# Patient Record
Sex: Male | Born: 1970 | Race: Black or African American | Hispanic: No | Smoking: Former smoker
Health system: Southern US, Community
[De-identification: ages and names within clinical notes are randomized; demographics above are authoritative.]

## PROBLEM LIST (undated history)

## (undated) DIAGNOSIS — Z940 Kidney transplant status: Secondary | ICD-10-CM

## (undated) DIAGNOSIS — M329 Systemic lupus erythematosus, unspecified: Secondary | ICD-10-CM

## (undated) DIAGNOSIS — N289 Disorder of kidney and ureter, unspecified: Secondary | ICD-10-CM

## (undated) DIAGNOSIS — IMO0002 Reserved for concepts with insufficient information to code with codable children: Secondary | ICD-10-CM

## (undated) DIAGNOSIS — D649 Anemia, unspecified: Secondary | ICD-10-CM

## (undated) DIAGNOSIS — M359 Systemic involvement of connective tissue, unspecified: Secondary | ICD-10-CM

## (undated) HISTORY — PX: NEPHRECTOMY TRANSPLANTED ORGAN: SUR880

## (undated) NOTE — *Deleted (*Deleted)
ID  Patient Vitals for the past 24 hrs:  BP Temp Temp src Pulse Resp SpO2  11/30/19 0823 123/60 98.4 F (36.9 C) Oral 65 16 100 %  11/30/19 0527 125/63 98.3 F (36.8 C) Oral 72 20 100 %  11/29/19 1943 130/75 97.7 F (36.5 C) - 76 20 100 %  11/29/19 1523 139/60 98.5 F (36.9 C) Oral - - -  11/29/19 1203 125/70 98 F (36.7 C) Oral 71 17 100 %    CBC Latest Ref Rng & Units 11/28/2019 11/27/2019 11/26/2019  WBC 4.0 - 10.5 K/uL 18.5(H) 9.5 11.7(H)  Hemoglobin 13.0 - 17.0 g/dL 10.1(L) 9.7(L) 9.5(L)  Hematocrit 39 - 52 % 31.3(L) 31.8(L) 29.8(L)  Platelets 150 - 400 K/uL 196 148(L) 158    CMP Latest Ref Rng & Units 11/29/2019 11/28/2019 11/27/2019  Glucose 70 - 99 mg/dL 99 99 94  BUN 6 - 20 mg/dL 26(H) 55(H) 41(H)  Creatinine 0.61 - 1.24 mg/dL 3.38(H) 5.51(H) 4.43(H)  Sodium 135 - 145 mmol/L 138 136 137  Potassium 3.5 - 5.1 mmol/L 3.5 5.0 5.2(H)  Chloride 98 - 111 mmol/L 99 98 101  CO2 22 - 32 mmol/L 27 24 22   Calcium 8.9 - 10.3 mg/dL 8.9 9.6 9.4  Total Protein 6.5 - 8.1 g/dL - - -  Total Bilirubin 0.3 - 1.2 mg/dL - - -  Alkaline Phos 38 - 126 U/L - - -  AST 15 - 41 U/L - - -  ALT 0 - 44 U/L - - -    Impression/recommendation 35 yr male with ESRD, 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 He was in septic shock and was in the ICU. Had CRRT, the wound culture had gram neg rods but was not finalized due to multiple organisms present. He was treated with vanco, ceftriaxone, followed by vanco, cefepime and clindamycin and then cefazolin for 4 days. The total duration of antibiotic was 10 days. Leucocytosis resolved and then shot up again and I am asked to see the patient for the same The wound does not look infected now As he is stable and no obvious signs of infection currently will hold off starting antibiotics and will repeat CBC . He alsoreceived high dose steroids which is being  tapered now. Discussed the management with the patient and Dr.Griffith

---

## 1998-04-06 ENCOUNTER — Encounter: Payer: Self-pay | Admitting: Emergency Medicine

## 1998-04-06 ENCOUNTER — Emergency Department (HOSPITAL_COMMUNITY): Admission: EM | Admit: 1998-04-06 | Discharge: 1998-04-06 | Payer: Self-pay | Admitting: Emergency Medicine

## 1998-05-04 ENCOUNTER — Ambulatory Visit (HOSPITAL_COMMUNITY): Admission: RE | Admit: 1998-05-04 | Discharge: 1998-05-05 | Payer: Self-pay | Admitting: *Deleted

## 1998-07-30 ENCOUNTER — Emergency Department (HOSPITAL_COMMUNITY): Admission: EM | Admit: 1998-07-30 | Discharge: 1998-07-30 | Payer: Self-pay | Admitting: *Deleted

## 1999-03-07 ENCOUNTER — Emergency Department (HOSPITAL_COMMUNITY): Admission: EM | Admit: 1999-03-07 | Discharge: 1999-03-07 | Payer: Self-pay | Admitting: Emergency Medicine

## 1999-06-14 ENCOUNTER — Inpatient Hospital Stay (HOSPITAL_COMMUNITY): Admission: EM | Admit: 1999-06-14 | Discharge: 1999-06-17 | Payer: Self-pay | Admitting: Emergency Medicine

## 1999-06-16 ENCOUNTER — Encounter: Payer: Self-pay | Admitting: Infectious Diseases

## 1999-10-18 ENCOUNTER — Inpatient Hospital Stay (HOSPITAL_COMMUNITY): Admission: EM | Admit: 1999-10-18 | Discharge: 1999-10-20 | Payer: Self-pay | Admitting: Emergency Medicine

## 1999-10-29 ENCOUNTER — Inpatient Hospital Stay (HOSPITAL_COMMUNITY): Admission: AD | Admit: 1999-10-29 | Discharge: 1999-11-01 | Payer: Self-pay

## 2000-12-14 ENCOUNTER — Emergency Department (HOSPITAL_COMMUNITY): Admission: EM | Admit: 2000-12-14 | Discharge: 2000-12-14 | Payer: Self-pay | Admitting: *Deleted

## 2000-12-28 ENCOUNTER — Emergency Department (HOSPITAL_COMMUNITY): Admission: EM | Admit: 2000-12-28 | Discharge: 2000-12-29 | Payer: Self-pay | Admitting: Emergency Medicine

## 2001-02-12 ENCOUNTER — Encounter (INDEPENDENT_AMBULATORY_CARE_PROVIDER_SITE_OTHER): Payer: Self-pay | Admitting: Specialist

## 2001-02-12 ENCOUNTER — Inpatient Hospital Stay (HOSPITAL_COMMUNITY): Admission: EM | Admit: 2001-02-12 | Discharge: 2001-02-19 | Payer: Self-pay | Admitting: Emergency Medicine

## 2001-02-12 ENCOUNTER — Encounter: Payer: Self-pay | Admitting: Emergency Medicine

## 2001-02-12 ENCOUNTER — Encounter: Payer: Self-pay | Admitting: *Deleted

## 2001-02-14 ENCOUNTER — Encounter: Payer: Self-pay | Admitting: Internal Medicine

## 2001-02-15 ENCOUNTER — Encounter: Payer: Self-pay | Admitting: *Deleted

## 2001-02-16 ENCOUNTER — Encounter: Payer: Self-pay | Admitting: Nephrology

## 2001-02-19 ENCOUNTER — Encounter: Payer: Self-pay | Admitting: Nephrology

## 2001-04-25 ENCOUNTER — Inpatient Hospital Stay (HOSPITAL_COMMUNITY): Admission: EM | Admit: 2001-04-25 | Discharge: 2001-04-28 | Payer: Self-pay | Admitting: Emergency Medicine

## 2001-05-06 ENCOUNTER — Ambulatory Visit (HOSPITAL_COMMUNITY): Admission: RE | Admit: 2001-05-06 | Discharge: 2001-05-06 | Payer: Self-pay

## 2001-05-16 ENCOUNTER — Encounter: Payer: Self-pay | Admitting: Nephrology

## 2001-05-16 ENCOUNTER — Encounter (INDEPENDENT_AMBULATORY_CARE_PROVIDER_SITE_OTHER): Payer: Self-pay | Admitting: Specialist

## 2001-05-16 ENCOUNTER — Inpatient Hospital Stay (HOSPITAL_COMMUNITY): Admission: EM | Admit: 2001-05-16 | Discharge: 2001-05-18 | Payer: Self-pay | Admitting: Emergency Medicine

## 2001-05-28 ENCOUNTER — Encounter: Payer: Self-pay | Admitting: Nephrology

## 2001-05-28 ENCOUNTER — Inpatient Hospital Stay (HOSPITAL_COMMUNITY): Admission: EM | Admit: 2001-05-28 | Discharge: 2001-06-01 | Payer: Self-pay

## 2001-06-01 ENCOUNTER — Encounter: Payer: Self-pay | Admitting: *Deleted

## 2001-08-18 ENCOUNTER — Ambulatory Visit (HOSPITAL_COMMUNITY): Admission: RE | Admit: 2001-08-18 | Discharge: 2001-08-18 | Payer: Self-pay | Admitting: Vascular Surgery

## 2001-08-20 ENCOUNTER — Ambulatory Visit (HOSPITAL_COMMUNITY): Admission: RE | Admit: 2001-08-20 | Discharge: 2001-08-20 | Payer: Self-pay | Admitting: Vascular Surgery

## 2001-08-20 ENCOUNTER — Encounter: Payer: Self-pay | Admitting: Vascular Surgery

## 2001-09-17 ENCOUNTER — Inpatient Hospital Stay (HOSPITAL_COMMUNITY): Admission: AD | Admit: 2001-09-17 | Discharge: 2001-09-19 | Payer: Self-pay | Admitting: Nephrology

## 2001-09-20 ENCOUNTER — Encounter: Payer: Self-pay | Admitting: Vascular Surgery

## 2001-09-20 ENCOUNTER — Observation Stay (HOSPITAL_COMMUNITY): Admission: RE | Admit: 2001-09-20 | Discharge: 2001-09-20 | Payer: Self-pay | Admitting: Vascular Surgery

## 2003-10-18 ENCOUNTER — Encounter: Admission: RE | Admit: 2003-10-18 | Discharge: 2003-10-18 | Payer: Self-pay | Admitting: Nephrology

## 2003-10-23 ENCOUNTER — Ambulatory Visit (HOSPITAL_COMMUNITY): Admission: RE | Admit: 2003-10-23 | Discharge: 2003-10-23 | Payer: Self-pay | Admitting: Nephrology

## 2004-10-03 ENCOUNTER — Encounter: Admission: RE | Admit: 2004-10-03 | Discharge: 2004-10-03 | Payer: Self-pay

## 2005-07-11 ENCOUNTER — Emergency Department (HOSPITAL_COMMUNITY): Admission: EM | Admit: 2005-07-11 | Discharge: 2005-07-11 | Payer: Self-pay | Admitting: Emergency Medicine

## 2007-09-05 ENCOUNTER — Ambulatory Visit: Payer: Self-pay | Admitting: Internal Medicine

## 2007-09-05 ENCOUNTER — Inpatient Hospital Stay (HOSPITAL_COMMUNITY): Admission: EM | Admit: 2007-09-05 | Discharge: 2007-09-07 | Payer: Self-pay | Admitting: Emergency Medicine

## 2010-02-10 ENCOUNTER — Encounter: Payer: Self-pay | Admitting: Nephrology

## 2010-06-04 NOTE — Consult Note (Signed)
NAMEJEREMEE, [name] NO.:  [id number]   MEDICAL RECORD NO.:  [id number]          PATIENT TYPE:  OBV   LOCATION:  [id number]                         FACILITY:  [facility]   PHYSICIAN:  Maudie Flakes. Hassell Done, M.D.   DATE OF BIRTH:  Dec 09, 1970   DATE OF CONSULTATION:  DATE OF DISCHARGE:                                 CONSULTATION   REASON FOR CONSULT:  Continuity of hemodialysis.   HISTORY OF PRESENT ILLNESS:  The patient is a 40 year old black man with  end-stage renal disease on hemodialysis, SLE, and BPH.  The patient  presented with 2-day history of pain in lower abdomen, perineal region,  and also in his genitalia.  This was associated with dysuria, urinary  urgency, fever, chills, and back pain.  The patient measured his fever  to be as high as 102 degrees Fahrenheit.  Normally, the patient passes  only small amount of urine, but with his pain he has been passing small  amount of dark colored cloudy material.  However, there is no blood in  his urates on discharge.  The patient took Tylenol for his fever, which  helped only little and his pain is constant.  The patient denies having  had any recent unprotected sexual intercourse.  He had last been checked  for HIV 2 years ago and it had been negative.   The patient's end-stage renal disease is secondary to SLE and he has  been on hemodialysis since 2001.  He goes to [facility] and gets Monday, Wednesday, and Friday dialysis.  He has got an  AV fistula on his right arm.   ALLERGIES:  The patient is allergic to CIPROFLOXACIN and SULFONAMIDE.   PAST MEDICAL HISTORY:  1. 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  2. SLE diagnosed in 1994.  3. BPH.  4. Hypertension, off medications because of problem with low blood      pressure.  5. Anemia.  6. Diverticulosis.  7. Cellulitis to left leg in 2001.  8. History of PermCath infection.   MEDICATIONS:  1. Aspirin 81 mg.  2. Hydroxychloroquine 200 mg once  daily.  3. Multivitamin 1 a day.  4. Protonix 40 mg once a day.  5. Prednisone 10 mg once daily.  6. Zosyn day one.   SOCIAL HISTORY:  The patient is a former smoker.  He drinks  occasionally. he is divorced.   FAMILY HISTORY:  The patient's mother died in her 30s secondary to brain  aneurysm.  The patient's father died--had history of coronary artery  disease.  Daughter with SLE.   REVIEW OF SYSTEMS:  Positive for fever, chills, polyuria, frequency,  urgency, and dysuria.  Negative for headache, focal neurological  deficits, rash, chest pain, shortness of breath, nausea, vomiting, and  diarrhea.   PHYSICAL EXAMINATION:  VITAL SIGNS:  Temperature 97.8, pulse 107 and  regular, respiratory rate 20, blood pressure 108/61, and oxygen  saturation 97% on room air.  GENERAL:  Moderate distress secondary to pain.  HEENT:  Normocephalic and atraumatic.  Moist mucous membranes.  Oropharynx without erythema or exudates.  NECK:  Supple without lymphadenopathy.  CARDIOVASCULAR:  Regular rate and rhythm with normal heart sound,  regular tachycardia, no murmur, rubs, or gallops.  LUNGS:  Clear to auscultation bilaterally.  SKIN:  No rash or lesions.  ABDOMEN:  Suprapubic tenderness.  GENITOURINARY:  Positive discharge noted on tip of penis.  RECTAL:  Tender prostate, done in ED.  EXTREMITIES:  No cyanosis, clubbing, or edema.  AXIS:  Right arm AV fistula.  NEURO:  Alert and oriented x3.  Nonfocal.   LABORATORY DATA:  WBC 15.6, ANC 13.9, MCV 105.2, hemoglobin 10.6, and  platelets 158.  Sodium 132, potassium 5.1, chloride 96, bicarbonate 22,  BUN 56, creatinine 12.7, last glucose 69, calcium 9.1.  Urinalysis, wbc  too numerous to count, rbc 3-6, and bacteria plus.   ASSESSMENT AND PLAN:  This is a 40 year old black man with SLE, end-  stage renal disease, presenting with perineal and genital pain, as well  as urethral discharge consistent with acute prostatitis.  1. End-stage renal  disease.  We will continue with Monday, Wednesday,      and Friday hemodialysis.  Currently, the patient's potassium in      normal.  He is nonacidotic and he does not have any uremic      symptoms.  We will get records from [facility]      only tomorrow to resume the patient's dialysis after checking the      patient's dry weight and erythropoietin dose.  2. Acute prostatitis.  We agree with antibiotics treatment.  It is      advisable that the patient will be placed on an antibiotic regimen,      which  is suitable with his hemodialysis schedule.  The patient can      be ideally switched to ceftazidime and vancomycin before he is sent      home.  We will need to follow culture results especially urine      culture results and we recommend getting blood culture too.  GC and      chlamydia studies will have to be followed and we also recommend      getting an HIV antibody test.  3. Systemic lupus erythematosus.  Continue with hydroxychloroquine.  4. Microcystic anemia.  Monitor hemoglobin and hematocrit.  Check      anemia panel, continue erythropoietin and      iron supplement based on records from his dialysis center.  5. Hypertension.  Hold off on BP medications for now as the patient's      blood pressure looks little soft.   Thank you for allowing [place] to help take care of this patient.      Dawna Part, MD  Electronically Signed      Maudie Flakes. Hassell Done, M.D.  Electronically Signed    AS/MEDQ  D:  09/05/2007  T:  09/06/2007  Job:  [id number]

## 2010-06-07 NOTE — H&P (Signed)
[facility]  Patient:    THEOPOLIS, AMESQUITA Visit Number: [id number] MRN: [id number]          Service Type: MED Location: [id number] Attending Physician:  Sol Blazing Dictated by:   Maia Plan, P.A. Admit Date:  05/28/2001   CC:         Lowella Bandy. Olevia Perches, M.D. [facility]  [facility]  W. Thomos Lemons, M.D.   History and Physical  REASON FOR ADMISSION: Abdominal pain.  HISTORY OF PRESENT ILLNESS: This is a 40 year old black male, with end-stage renal disease secondary to systemic lupus erythematosus, on chronic hemodialysis every Monday, Wednesday, and Friday at [facility], who has been receiving Tressie Ellis for the last month for Stenotrophomonas maltophilia sepsis since around April 21, 2001.  Also recently hospitalized May 16, 2001 to May 18, 2001 with abdominal pain.  He underwent colonoscopy by Dr. Delfin Edis with findings of ascending colonic ulcer and colitis on pathology.  CT of the abdomen at that time showed improved bowel wall thickening in the transverse colon but worsened in the right colon and cecum.  A capsule endoscopy was recommended but the patient left AMA.  Over the past week he has had post dialysis fevers and chills, with temperatures ranging 99-102.4 degrees.  (The patient had fevers during dialysis while hospitalized also).  Blood cultures were repeated on May 24, 2001 and are now growing Enterobacter which are resistant to penicillin and Tressie Ellis among other drugs.  It is sensitive to tobramycin and it was ordered to be dosed today; however, the patient was awakened at approximately 3 a.m. with diffuse abdominal pain and came to the emergency room, thereby missing his outpatient dialysis treatment this morning.  He describes his abdominal pain as "burning and cramping."  He had nausea, vomiting, and diarrhea - three to four episodes on Wednesday, May 26, 2001, but none since.  He is  being admitted now for treatment of his UN or bacteria sepsis, management of his GI pain, and catheter removal.  ALLERGIES:  1. CIPRO.  2. SULFA.  CURRENT MEDICATIONS:  1. Hytrin 5 mg b.i.d.  2. Metoprolol 50 mg b.i.d.  3. Procardia XL 90 mg q.p.m.  4. Calcium carbonate 500 mg two with each meal.  5. Nephro-Vite vitamin one q.d.  6. Protonix 40 mg q.h.s.  7. Prednisone 7.5 mg q.d.  8. Plaquenil 400 mg q.d.  9. Fortaz 2 g IV each dialysis. 10. InFeD 50 mg IV every Wednesday. 11. Epogen 15,000 units IV each dialysis. 12. Rocaltrol 0.5 mcg q.d.  PAST MEDICAL HISTORY:  1. End-stage renal disease secondary to systemic lupus erythematosus,     starting on hemodialysis September 2002.  2. History of GI bleeds including mild duodenitis, diverticulosis, and     most recently colitis.  3. Hypertension.  4. Systemic lupus erythematosus, followed by Dr. Justine Null, diagnosed 1994.  5. Iron deficiency anemia.  6. 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  7. History of multiple sepsis, hemodialysis catheter related, including     Serratia marcescens, Stenotrophomonas maltophilia, and most recently     Enterobacter.  8. Benign prostatic hypertrophy.  SOCIAL HISTORY: The patient lives in [place].  Separated currently and has a seven-year-old daughter.  He occasionally smokes cigarettes but denies alcohol.  FAMILY HISTORY: Mother is deceased from aneurysm.  His father had Crohns disease and hypertension.  He has two half-brothers, one with heart disease.  REVIEW OF SYSTEMS:  Positive for fever and chills, generalized malaise, decreased appetite, abdominal pain, nausea, vomiting, and diarrhea on occasion.  Negative for shortness of breath, chest pain, cough, URI symptoms, dysuria, edema, obvious blood in vomit or stools.  PHYSICAL EXAMINATION:  VITAL SIGNS: On admission blood pressure is 180/105, temperature 98.7 degrees, pulse is 82 and regular, respirations are  18.  GENERAL: Well-developed, well-nourished black male in no acute distress, who is now status post Dilaudid therapy.  He is awake, alert, appropriate, oriented x3.  NECK: Supple.  LUNGS: Clear.  The right IJ Diatek exit site has a small amount of green crust but no pus expressable.  The tunnel is nontender and otherwise unremarkable.  HEART: Regular rate and rhythm.  Positive S4.  No murmurs, no rub.  ABDOMEN: Positive bowel sounds.  Belly is soft with diffuse and generalized tenderness throughout.  There is question of rebound.  There are no masses, no organomegaly.  RECTAL: Patient refused.  EXTREMITIES: Nonfunctioning right radial AV fistula.  No lower extremity edema.  LABORATORY DATA: Laboratories are pending.  ASSESSMENT/PLAN:  1. New Enterobacter sepsis, May 24, 2001.  Possibly a gastrointestinal source.     Will add tobramycin and continue [place].  Plan to proceed with already     planned catheter removal after dialysis today in view of infra and post     dialysis fever and chills.  New catheter is scheduled for Jun 01, 2001     with cardiovascular/thoracic surgery.  2. Abdominal pain and colitis.  Follow symptoms with antibiotic therapy.  Use     clear liquids for now.  Continue medications.  Consult gastroenterology if     symptoms do not resolve.  3. End-stage renal disease.  Dialysis today, then catheter removal.  4. Hypertension.  Continue medications.  5. Systemic lupus erythematosus.  Continue Plaquenil and prednisone.  6. Iron deficiency anemia secondary to #2 and #3.  Continue Epogen and     InFeD.  Transfuse p.r.n.  Repeat stool guaiac (strongly positive May 16, 2001). Dictated by:   Maia Plan, P.A. Attending Physician:  Sol Blazing DD:  05/28/01 TD:  05/30/01 Job: [id number]

## 2010-06-07 NOTE — Discharge Summary (Signed)
[name]. [name]  Patient:    Walter, [name]                        MRN: [id number] Adm. Date:  [id number] Disc. Date: [id number] Attending:  Arlice [name]                           Discharge Summary  DISCHARGE DIAGNOSES: 1. Cellulitis, left thigh, organism currently undetermined. 2. 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 3. Chronic renal failure, with a creatinine of 4.6 to 5.6, secondary to    sclerosing glomerular nephritis. 4. Hypertension. 5. Diffuse eczematoid pustulosis, secondary to antibiotic drug reaction. 7. IgM antiphospholipid antibody positive, negative IgG antiphospholipid    antibody and lupus anticoagulant in the past.  HISTORY OF PRESENT ILLNESS:  [name] is a pleasant gentleman age 40, who unfortunately has had sclerosing glomerular nephritis, which has progressed to chronic renal insufficiency, with repeat biopsies showing sclerosis, but not active renal disease, and hence no need for either steroids or cyclophosphamide.  He developed, in spite of being relatively stable overall, about October 17, 1999, a small nodule and tenderness in the medial left thigh, with no trauma, which began to expand some, with associated fever, and was seen and admitted at that time for possible cellulitis.  Blood cultures were negative.  There was no other fluid to culture, and he improved on IV Zosyn with decrease in temperature and decreased swelling.  Subsequently on home Augmentin he developed an eczematoid pustulosis, has probably a drug reaction to one or other antibiotics, which eventually responded to increasing his prednisone and discontinuing his medications.  Because of spreading of the area of inflammation, he was then readmitted for aspiration and culture of this area, an  infectious disease consultation, and a renal consultation, because of the difficulty with antibiotic therapy in him, and also a previous history of beta-lactam allergies, as well as to floxin drugs, and sulfa drugs.  [name] COURSE:  He had a reasonably benign hospitalization with infectious disease input and renal input being noted in the chart.  A culture was obtained of the area of inflammation.  There was some pustular material removed, but at the time of discharge, no acetic bacteria or other organisms have been grown, with cultures pending on fungi and AFB.  He did seem to respond to the initiation of clindamycin which he did tolerate, and was felt safe to discharge him home relatively early, on clindamycin, for followup by Infectious Disease and [name] carefully.  Renal will continue to follow him, obviously because of his need for dialysis in the near future.  LABORATORY DATA:  Revealed a white count of 14,200 on admission, and hemoglobin 7.0, rising to 8.3, and subsequently being followed outside the [name], without a transfusion initially, with no bleeding because of the presence of warm antibody, making transfusion difficulty.  Platelets were 451,000.  He had 84 neutrophils, 11 lymphs, 3 monos, and 1 eosinophil on admission.  His sedimentation was greater than 140, and his PT was 12.7, INR 1.0, PTT 31.  Sodium 141, potassium 4.6, chloride 108, BUN 78, creatinine 5.1, calcium 8.6, albumin 3.1, amylase slightly elevated on admission to 178. Normal liver function tests otherwise.  CPK was 78.  Iron levels were basically normal.  HIV was nonreactive.  C3 was low at 75, low normal 88, C4 level at 14, low normal 16.  His urine showed 100 mg per dl of protein. Blood cultures were no growth at this time as well.  An MRI did suggest an area of soft tissue abscess in the medial thigh of small size.  DISPOSITION:  It was felt that he had improved sufficiently to follow  him as an outpatient, with continue antibiotic of clindamycin, and close followup. These appointments were scheduled for him.  He would be started on a PPO if need be if his hemoglobin did not rise, once his more acute infection and inflammatory process resolved.  DISCHARGE MEDICATIONS: 1. Clindamycin 300 mg q.8h. 2. Prednisone 20 mg q.d., to be tapered back to 10 mg. 3. Procardia XL 60 mg q.d. 4. Multivitamin one q.d.  FOLLOWUP:  He is to see [name] in one week, or sooner if needed.  He is to follow up with infectious disease in about two weeks. DD:  12/03/99 TD:  12/03/99 Job: [id number]

## 2010-06-07 NOTE — Consult Note (Signed)
[facility]  Patient:    [name], [name] Visit Number: [id number] MRN: [id number]          Service Type: MED Location: [id number] Attending Physician:  Sol Blazing Dictated by:   Judeth Cornfield. Scot Dock, M.D. Proc. Date: 04/27/01 Admit Date:  04/24/2001                            Consultation Report  REASON FOR CONSULTATION:  Hemodialysis access.  HISTORY:  This is a pleasant 40 year old gentleman who was admitted on April 25, 2001 with a GI bleed.  He has apparently undergone a fairly thorough work-up without any identification of the source of bleeding.  Patient also has a history of end-stage renal disease secondary to lupus and currently has a functioning right IJ temporary dialysis catheter.  He was apparently scheduled to see [place] in the office to arrange for future access, but was in the hospital.  We are asked to see him during this admission to consider our options for further access.  Of note, this patient had an AV fistula placed in his right wrist in [place] in October 2002.  Apparently, this wrist was operated on twice, once to try to revise the fistula.  This fistula is now clotted and nonfunctioning.  The patient is left handed.  His end-stage renal disease is secondary to lupus.  He dialyzes typically on Mondays, Wednesdays, and Fridays.  He is scheduled for dialysis tomorrow.  With respect to his recurrent GI bleed, it is felt that most likely the source is the small bowel, although this has never been clearly identified despite a very aggressive work-up.  He is apparently being considered for capsule endoscopy once this program is in full force here at [facility].  PAST MEDICAL HISTORY: 1. Lupus. 2. End-stage renal disease secondary to lupus. 3. Hypertension. 4. History of anemia secondary to GI bleed as described above and also    secondary to his end-stage renal disease. 5. BPH.  SOCIAL HISTORY:  He  quit smoking recently.  He had been smoking a half a pack per day.  PHYSICAL EXAMINATION  VITAL SIGNS:  Temperature 97.4, blood pressure 147/89, heart rate 78.  EXTREMITIES:  He does not have a palpable right radial pulse.  I cannot even obtain a radial signal on the right with a Doppler.  He does have a brisk ulnar signal on the right and a palmar arch signal on the right.  I did not see a usable upper arm cephalic vein.  On the left side he has a palpable brachial and radial pulse.  I have recommended that we map his upper arm cephalic vein on the right to determine if he is a candidate for an upper arm fistula on the right.  He will be at slightly increased risk for steel given his radial artery occlusion on the right, although he appears to have a widely patent ulnar system.  If he is not a candidate for fistula, would consider placing an AV graft on the right side either in the forearm or in the upper arm if this artery and vein were too small.  I have discussed the procedure and potential complications including, but not limited to, bleeding, wound problems, steel syndrome, graft thrombosis, and graft infection.  All his questions were answered.  We can tentatively schedule this for Thursday if he is still in the hospital or else arrange for  it as an outpatient if he goes home before then.  He is concerned about his insurance and wants to check on this before agreeing to schedule surgery on Thursday. Dictated by:   Judeth Cornfield Scot Dock, M.D. Attending Physician:  Sol Blazing DD:  04/27/01 TD:  04/27/01 Job: [id number]

## 2010-06-07 NOTE — Discharge Summary (Signed)
[name], [name]               ACCOUNT NO.:  [id number]   MEDICAL RECORD NO.:  [id number]          PATIENT TYPE:  INP   LOCATION:  [id number]                         FACILITY:  [facility]   PHYSICIAN:  C. Milta Deiters, M.D.DATE OF BIRTH:  01-Jan-1971   DATE OF ADMISSION:  09/05/2007  DATE OF DISCHARGE:  09/07/2007                               DISCHARGE SUMMARY   DISCHARGE DIAGNOSES:  1. Acute prostatitis, cystitis.  2. End-stage renal disease, hemodialysis dependent.  3. Anemia of chronic disease.  4. Systemic lupus erythematosus, steroid dependent.  5. Diverticulosis.  6. History of cellulitis to the left leg in 2001.  7. History of Permacath infection.  8. Benign prostatic hyperplasia.   His medications at discharge:  1. Flomax 0.4 mg p.o. nightly.  2. Prednisone 5 mg p.o. daily.  3. Plaquenil 400 mg p.o. daily.  4. Omeprazole 20 mg p.o. nightly.  5. Fosrenol 1500 mg p.o. t.i.d.  6. Nephro-Vite p.o. daily.  7. Phenergan 25 mg p.o. p.r.n. for nausea.  8. Vicodin 5/500 mg 1 tablet p.o. q.6 h. p.r.n. for pain.  9. Aspirin 81 mg p.o. daily.  10.Aranesp 1000 mcg IV t.i.d. three times weekly with dialysis.   DISPOSITION AND FOLLOWUP:  [name] is being discharged in stable and  improved condition.  He has resolution of his symptoms of prostatitis.  He will return to his normal hemodialysis schedule on Monday, Wednesday,  Friday at [facility].  Dialysis orders will be per  Nephrology.  His primary nephrologist is Dr. Corliss Parish and he  will have hospital followup with her in 2-4 weeks.   BRIEF ADMITTING HISTORY AND PHYSICAL:  Vital signs at admission,  temperature 97.2, blood pressure 87/59, pulse 150, respiratory rate 24,  O2 sats 97% on room air.   Laboratories on admission, sodium 132, potassium 5.1, chloride 96,  bicarb 22, BUN 56, creatinine 12.7, glucose 69, hemoglobin 10.6, WBCs  15.6, hematocrit 32, platelets 158, ANC 13.9, MCV 105.2, anion gap  14.  Urinalysis showed WBCs too numerous to count, 3-6 rbc's with many  bacteria, greater than 300 protein, pH was 7, moderate leukocyte  esterase.  His EKG showed sinus tachycardia of no ST elevation or  depression.  No T-wave changes or other changes consistent with  significant hyperkalemia.   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  He has a known history of BPH.  However, he presented to the emergency  room complaining of approximately 36 hours of genital pain and urinary  frequency.  He had a fever of 102 prior to coming to the emergency room  and had severe pain in his scrotal region when urinating.  He had not  been taking his terazosin for the past 2-3 months due to episodes of  hypotension.   CONSULTATIONS:  Maudie Flakes. Hassell Done, MD with [facility] for  hemodialysis and evaluation.   PROCEDURES:  1. Hemodialysis, Monday, Wednesday, and Friday on his regular schedule      while in the hospital.  2.  Chest x-ray with no acute findings.   1. Acute prostatitis:  [name] was admitted for IV antibiotics      given his hypotension and fever.  He was maintained on his regular      hemodialysis regimen.  He received ceftazidime and vancomycin.      Post-dose with hemodialysis.  Urine cultures and urinalysis      supported a diagnosis of prostatitis.  GC and chlamydia tests were      negative.  Urine culture grew Serratia marcescens, which was      resistant to Macrobid and cefazolin, was sensitive to Cipro,      ceftriaxone, and Levaquin as well as sensitive to Bactrim.  He had      resolution of his symptoms and fever while in the hospital.      Routine blood culture was obtained and was negative for growth.      His antibiotic regimen was narrowed to ceftriaxone and this will be      continued for 10 days IV and will be given with hemodialysis.  He      was also  restarted on Flomax and told to stop his finasteride.  At      the time of discharge, there was no evidence of urinary retention.  2. End-stage renal disease:  This is secondary to systemic lupus.  He      is followed closely by Nephrology.  His hemodialysis is scheduled      for tomorrow.  Dialysis orders will be handled by the nephrology      resident and fax to the [facility].  3. Systemic lupus erythematosus:  The patient is routinely followed by      Dr. Thomos Lemons with Rheumatology.  He has been managed Plaquenil      and prednisone.  No changes to this regimen.  4. Hypotension and tachycardia:  Likely in the setting of infection      from his prostatitis.  This resolved at the time of discharge.   Vital signs at discharge, T-max 99.5, blood pressure 122/64, pulse 84,  respiratory rate 18.   Sodium 136, potassium 5.0, chloride 95, bicarb 29, BUN 35, creatinine  9.6, glucose 126, hemoglobin 9.9, WBCs 11, hematocrit 30, platelets 167,  alk phos 61, AST 14, ALT 11, protein 6.1, albumin 3.0, calcium 9.6,  phosphorus 4.4.      Acquanetta Chain, D.O.  Electronically Signed      C. Milta Deiters, M.D.  Electronically Signed    ELG/MEDQ  D:  11/03/2007  T:  11/04/2007  Job:  [id number]

## 2010-06-07 NOTE — Procedures (Signed)
[facility]  Patient:    [name], [name] Visit Number: [id number] MRN: [id number]          Service Type: MED Location: [id number] Attending Physician:  Sol Blazing Dictated by:   Jim Desanctis, M.D. Proc. Date: 05/17/01 Admit Date:  05/16/2001                             Procedure Report  PROCEDURE:  Colonoscopy.  INDICATION FOR PROCEDURE:  Rectal bleeding. Patient with recurrent GI bleed. CAT scan showing question of inflammatory changes in transverse colon.  ANESTHESIA:  Demerol 80, Versed 6 mg.  DESCRIPTION OF PROCEDURE:  With the patient mildly sedated in the left lateral decubitus position, the Olympus videoscopic colonoscope was inserted in the rectum and passed under direct vision to the cecum identified by the ileocecal valve and appendiceal orifice. We could not enter into the terminal ileum this time. The prep was suboptimal in this area. Despite our cleansing, we could not ever really see the opening of the bowel very well except in a transitory situation. From this point, the colonoscope was then slowly withdrawn taking circumferential views of the entire colonic mucosa, stopping in the ascending colon area where there was a questionable area of an ulcer or possibly some inflammatory changes that were uncovered after we washed. We biopsied this area, it is unclear whether this was truly inflammatory. The colonoscope was then withdrawn taking circumferential views of the remaining colonic mucosa stopping in the rectum which appeared normal in direct and retroflexed view. The endoscope was straightened and withdrawn. The patients vital signs and pulse oximeter remained stable. The patient tolerated the procedure well without apparent complications.  FINDINGS:  Questionable area of colitis very localized in the right colon, etiology not clear. Await biopsy report. Will plan on going ahead with capsule endoscopy which was done  previously but study was worthless because the patient had eaten prior to the examination. Will plan to do that in the near future hopefully on this admission. Dictated by:   Jim Desanctis, M.D.  Attending Physician:  Sol Blazing DD:  05/17/01 TD:  05/18/01 Job: [id number]

## 2010-06-07 NOTE — Discharge Summary (Signed)
[place]  Patient:    [name], [name] Visit Number: [id number] MRN: [id number]          Service Type: MED Location: [id number] Attending Physician:  Sol Blazing Dictated by:   Tonye Pearson, M.D. Admit Date:  05/16/2001 Discharge Date: 05/18/2001                             Discharge Summary  DISCHARGE DIAGNOSES: 1. Gastrointestinal bleed, source not found. 2. End-stage renal disease, hemodialysis dependent. 3. Hypertension. 4. Systemic lupus erythematosus. 5. Anemia. 6. Benign prostatic hypertrophy.  DISCHARGE MEDICATIONS:  1. Hytrin 5 mg p.o. b.i.d.  2. Calcium carbonate 500 mg two tablets p.o. b.i.d. with meals.  3. Protonix 40 mg p.o. daily.  4. Nephro-vite 1 p.o. daily.  5. Metoprolol 50 mg p.o. b.i.d.  6. Calcitrol 0.5 micrograms p.o. daily.  7. Prednisone 7.5 mg p.o. daily.  8. Plaquenil 400 mg p.o. daily.  9. Procardia 90 mg p.o. daily. 10. Quinine sulfate 325 mg p.o. b.i.d. p.r.n. cramps.  HEMODIALYSIS ORDERS:  He goes to [facility] on Monday, Wednesday, and Friday.  Time: 4 hours.  Blood flow rate:  400.  Dialysis flow rate: 800.  Estimated dry weight 91.0 kg.  No heparin because of recent GI bleed.  Epogen 15,000 units q. hemodialysis on Monday, Wednesday, Friday and a CBC q. hemodialysis to check his hemoglobin.  DIET:  80 gram protein, 2 gram sodium, 2 gram potassium renal diet.  ADMISSION HISTORY AND PHYSICAL:  A 40 year old African-American male with end-stage renal disease on hemodialysis stated that on 4/26 he had a normal stool in the morning and in the evening had a stool in which he saw some bright red blood mixed in with clots.  The following morning, he had some bright red blood from the rectum followed by cramps.  The amount was small but he was concerned because of a history of GI bleed and presented to the emergency department.  He has had previous GI bleeds. Most recently,  January of 2003, he was worked up at the [facility] and then transferred to [facility] where he had an upper GI which was negative and a colonoscopy which showed diffuse blood throughout the colon and was difficult to interpret.  On admission, his vitals were stable, blood pressure 157/100.  He had diffuse abdominal pain and stool guaiac was positive.  Hemoglobin was 8.8.  For the remainder of exam, please refer to dictated H&P.  HOSPITAL COURSE: 1 - GI bleed:  Dr. Olevia Perches evaluated the patient in the emergency department and scheduled him for an abdominal CT scan with contrast.  This revealed improved transverse colon, some interval worsening in the cecum and right side of the colon, questionable ischemic colitis.  He was scheduled for a colonoscopy on 4/28 which he underwent without complication.  His findings including a possible ulcer at the ascending colon near the hepatic flexure. Dr. Lajoyce Corners recommended capsule endoscopy for a more detailed evaluation.  He underwent a similar study approximately three weeks earlier was evidently was not NPO and this study could not be adequately evaluated.  Colon biopsy results are pending at the time of discharge.  The capsule endoscopy is best performed as an inpatient and this was communicated to the patient.  He had been extremely reluctant to remain inpatient for the colonoscopy and refused to stay for any further work up.  He was therefore discharged to home against medical advise.  He understands that he may have further bleeding and is urged to follow up with Dr. Lajoyce Corners as an outpatient.  2 - Anemia:  This is secondary to chronic disease as well as acute blood loss. The patient has multiple antigens which made typing him difficult, however, he was transfused two units of packed red blood cells while on hemodialysis on hospital day two.  He tolerated this well but developed a low grade fever toward the end of the transfusion.  His blood  pressure and heart rate remained stable and he was otherwise asymptomatic.  His hemoglobin increased appropriately with transfusion.  He did not have any further bloody stools during hospitalization.  3 - Lupus:  He was continued on Plaquenil and prednisone as an inpatient.  4 - Hypertension:  His home medications were continued.  He tolerated these well.  5 - BPH:  Hytrin was continued as an inpatient.  6 - End-stage renal disease:  He was dialyzed on 4/28.  He tolerated this well and received blood products with transfusion.  A total of 2300 cc was ultrafiltrated.  He will return to [facility] for his scheduled dialysis on 4/30. Follow up will be at the [facility] and with Dr. Jim Desanctis for possible outpatient GI workup.  At the time of discharged, the patient is refusing any further evaluation.  He is unwilling to be NPO for further studies and requests to be discharged home.  Risks and benefits of the capsule endoscopy were explained to him as well as the risk of a future GI bleed.  He understands these risks and was able to restate them in his own words and requests to be discharged. Dictated by:   Tonye Pearson, M.D. Attending Physician:  Sol Blazing DD:  05/18/01 TD:  05/18/01 Job: [id number]

## 2010-06-07 NOTE — Discharge Summary (Signed)
NAME:  [name], [name] NO.:  [id number]   MEDICAL RECORD NO.:  [id number]                   PATIENT TYPE:  INP   LOCATION:  5509                                 FACILITY:  [facility]   PHYSICIAN:  Sherril Croon, M.D.                DATE OF BIRTH:  1970/03/26   DATE OF ADMISSION:  09/17/2001  DATE OF DISCHARGE:  09/19/2001                                 DISCHARGE SUMMARY   ADMITTING DIAGNOSES:  1. Enterococcal sepsis secondary to hemodialysis PermCath.  2. End-stage renal disease, on chronic hemodialysis.  3. Hypertension.  4. Benign prostatic hypertrophy.  5. Systemic lupus erythematosus.  6. Anemia of chronic disease.   DISCHARGE DIAGNOSES:  1. Enterococcal sepsis secondary to hemodialysis PermCath, status post     removal of hemodialysis PermCath.  2. End-stage renal disease, on chronic hemodialysis, currently without     access.  3. Hypertension.  4. Benign prostatic hypertrophy.  5. Systemic lupus erythematosus.  6. Anemia of chronic disease.  7. Enterobacter cloacae grown on catheter tip.   BRIEF HISTORY:  A 40 year old black male with end-stage renal disease  secondary to systemic lupus erythematosus, on chronic hemodialysis every  Monday/Wednesday/Friday at [facility], who has suffered  from recurrent PermCath-related sepses involving various organisms as  follows:  January 2003, Serratia sepsis, PermCath replaced; May 2003,  Enterobacter sepsis with PermCath replaced; July 2003, E. coli sepsis,  treated with Ancef for 10 days; July 2003, Enterobacter sepsis, treated with  Tressie Ellis for a two-week course.  On August 18th, surveillance blood cultures  were done which grew out Enterococcus, treated with vancomycin since September 08, 2001.  On the day of admission, the patient arrived to his outpatient  dialysis unit febrile, a 100.1 temperature, complaining of malaise and  experiencing hard, shaking chills while on dialysis  associated with  hypotension.  He was dosed his previously prescribed 750 mg of vancomycin  and was admitted to the hospital post dialysis.  Upon admission, CVTS will  remove his left IJ PermCath, blood cultures will be repeated and we will  plan for a new PermCath placement with CVTS the following week.   LABORATORY DATA ON ADMISSION:  White count 14,100, hemoglobin 11.5,  platelets 105,000.  Sodium 138, potassium 4.5, chloride 95, CO2 32, glucose  86, BUN 20, creatinine 6.4, calcium 8.8, albumin 3.4.  LFTs within normal  limits.  Total bilirubin 1.7.  Urinalysis clear, negative leukocyte  esterase/nitrites, 0 white blood cells per high power field.   HOSPITAL COURSE:  The patient was admitted, placed on his usual medications.  CVTS removed the PermCath and sent the tip for culture on the day of  admission; that culture grew back Enterobacter cloacae resistant to Ancef,  ampicillin, but otherwise sensitive to most other antibiotics.  As outlined  in the admission note, due to his numerous episodes of bacteremia with  exclusively  enteropathogens in 2003, it was suspicious that he possibly may  have seeding of the blood stream from his GI tract.  In 2002, the patient  had several episodes of acute GI bleeds with unclear etiology, despite  multiple investigations.  Infectious disease was asked to consult; Dr.  Arelia Longest. Quentin Cornwall evaluated the patient and his previous infections.  Dr.  Quentin Cornwall felt that the patient's life might be less turbulent with fewer  morbidities without his colon.  At minimum, he recommended a colonoscopy but  the patient was strongly objecting to this.  Dr. Quentin Cornwall was suspicious for  a possible conduit between his blood stream and his colonic passage.  The  blood cultures grew out Enterococcus species, pansensitive.  He was  discharged home on vancomycin, to receive 1 g after each dialysis for two  more weeks.  He will return for a new catheter placement by Dr.  Judeth Cornfield. Scot Dock, September 1st.  He will go to outpatient dialysis following  catheter replacement at [facility].  At this point, the  patient was not willing to proceed with any further GI workup and he was  discharged much improved.   DISCHARGE MEDICATIONS:  1. Prednisone 10 mg daily.  2. Plaquenil 200 mg two daily.  3. Nephro-Vite vitamin one daily.  4. Calcium 500 mg three with each meal.  5. Lopressor 50 mg b.i.d.  6. Procardia XL 90 mg q.p.m.  7. Hytrin 10 mg q.p.m.  8. Protonix 40 mg q.h.s.  9. Calcijex 0.5 mcg IV each dialysis.  10.      EPO 15,000 units each dialysis.  11.      Vancomycin 1 g IV each dialysis for two more weeks.   FOLLOWUP:  New PermCath by Dr. Deitra Mayo, September 20, 2001, at be  at short-stay at 6 a.m., with dialysis every Monday/Wednesday/Friday at the  [facility].      Nonah Mattes, P.A.                      Sherril Croon, M.D.    RRK/MEDQ  D:  11/07/2001  T:  11/08/2001  Job:  [id number]   cc:   [place]. Michelle Nasuti., M.D.  [address] [place]  [place] [address]  Fax: [phone]

## 2010-06-07 NOTE — Op Note (Signed)
[place]  Patient:    [name], [name] Visit Number: [id number] MRN: [id number]          Service Type: MED Location: [id number] Attending Physician:  Sol Blazing Dictated by:   Gordy Clement, M.D. Proc. Date: 06/01/01 Admit Date:  05/28/2001 Discharge Date: 06/01/2001                             Operative Report  PREOPERATIVE DIAGNOSIS:  End-stage renal failure.  POSTOPERATIVE DIAGNOSIS:  End-stage renal failure.  PROCEDURES: 1. Ultrasound localization of right internal jugular vein. 2. Insertion of right internal jugular Diatek catheter.  SURGEON:  Gordy Clement, M.D.  ASSISTANT:  Nurse.  ANESTHESIA:  Local with MAC.  ANESTHESIOLOGIST:  Glynda Jaeger, M.D.  CLINICAL NOTE:  This is a 40 year old male with end-stage renal failure. Recently presented with an infected right internal jugular Diatek catheter. This was removed, and he has been free of the catheter for five days.  He is brought to the operating room at this time for insertion of a new catheter.  DESCRIPTION OF PROCEDURE:  Patient brought to the operating room in stable condition.  Placed in the supine position.  Right neck and chest prepped and draped in a sterile fashion.  Ultrasound localization of right internal jugular vein carried out.  This revealed good compressability and respiratory variation.  Skin and subcutaneous tissue of the right neck instilled with 1% Xylocaine.  A needle was used to introduce to the right internal jugular vein.  Initial attempt made to pass a 0.035 J-wire was unsuccessful.  An angled glidewire was then advanced through the needle and with the torquer was advanced into the superior vena cava under fluoroscopy.  An incision made at the insertion site in the base of the neck.  A 16 French tear-away sheath and dilator were advanced over the guidewire to the superior vena cava under fluoroscopy.  The dilator and guidewire  were removed.  Catheter advanced through the sheath and the sheath removed.  The catheter positioned at the superior vena cava-right atrial junction.  A subcutaneous tunnel created.  The catheter brought through the tunnel.  The hub mechanism assembled.  The catheter flushed with heparin and saline solution, capped with heparin.  The insertion site closed with interrupted 3-0 nylon suture.  The catheter affixed to the skin with interrupted 2-0 silk suture.  Sterile dressings were applied.  The patient tolerated the procedure well.  Transferred to the recovery room in stable condition.  Chest x-ray ordered. Dictated by:   Gordy Clement, M.D. Attending Physician:  Sol Blazing DD:  06/01/01 TD:  06/02/01 Job: [id number]

## 2010-06-07 NOTE — Discharge Summary (Signed)
[place]  Patient:    [name], [name] Visit Number: [id number] MRN: [id number]          Service Type: MED Location: [phone] Attending Physician:  Georgette Shell Dictated by:   Vela Prose, P.A.C. Admit Date:  02/12/2001 Discharge Date: 02/19/2001   CC:         [place]. Geri Seminole., M.D. [facility] and W. Thomos Lemons, M.D.  Jim Desanctis, M.D. and Melene Muller, M.D.  Rosetta Posner, M.D. and Coralie Keens, M.D.  Nadene Rubins, M.D.   Discharge Summary  DISCHARGE DIAGNOSES: 1. Diverticulosis with gastrointestinal bleed. 2. Systemic lupus erythematous. 3. Warm autoantibody. 4. Hypertension. 5. Serratia marcescens. 6. Sepsis. 7. End-stage renal disease. 8. Anemia.  PROCEDURE: 1. January 24, EGD and colonoscopy with findings of mild gastritis on EGD,    blood throughout the colon and in terminal ileum.  Impression was a small    bowel bleed versus reflux.  Dr. Jim Desanctis. 2. January 24, bleeding scan.  Nuclear medicine GI blood loss study with    the impression of no gross extravasation after 40 minutes, most of the    acetope is in the reticuloendothelial system limiting the diagnostic    accuracy of the examination. 3. January 26, nuclear med bowel imaging, negative Meckels scan. 4. January 27, right IJ hemodialysis catheter removed by Vela Prose, P.A.C.    secondary to Serratia Marsescens sepsis. 5. January 27, upper GI series with small bowel followthrough.  The upper    GI showed eccentuated folds in the duodenal bulb and second duodenum.    Likely duodenitis, no definite ulcer was demonstrated.  The small bowel    followthrough was normal except for slight prolongation of transient    time. 6. January 31, right IJ Diatek catheter placed by Rosetta Posner, M.D.  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  This is a consequence of systemic lupus erythematous.  He is followed at the [facility].  He also has rheumatic disease for which he is in the care of [facility] physicians for the last 1-1/2 years and dialysed at [facility].  Previous to this hospitalization, he had been in the [facility] for a GI bleed and being treated for colitis with antibiotics. During his admission he also had a postural hypotension.  Apparently, he had three units of blood transfusions during that hospitalization.  The day before hospital admission he had missed his dialysis treatment secondary to poor weather.  He began to notice that he was having red blood in his stool and some weakness and came to [facility] for evaluation because "the [place] couldnt find out where he was bleeding."  On admission, he was noted to have some red blood in his stool and had a hemoglobin of 5.3.  Two weeks previous to this his hemoglobin was 10.2.  This patient has been on hemodialysis since October of 2002 in [place]. His hemodialysis accesses include a failed right AVF and at the time of admission he was using a Permcath.  LABORATORY DATA:  Sodium 143, potassium 5.8, chloride 101.  CO2 22, BUN 86, creatinine 9.4, and glucose 88.  WBC 15.8, platelets 169, and hemoglobin 5.3.  PHYSICAL EXAMINATION:  VITAL SIGNS: Temperature 97.5, blood pressure 161/98, pulse 111, and respirations 24.  HOSPITAL COURSE:  #  1 - Diverticulosis with gastrointestinal bleed.  Jim Desanctis, M.D. was immediately consulted and noted that the patient has a history of GI bleed at the [facility].  He performed the procedures listed above in #1, #2, and #3 and only noted a mild gastritis, but blood was throughout the patients colon. Surgical consult was obtained by Dr. Ninfa Linden who recommended a Meckels scan and a small bowel endoscopy to attempt to find the source.  Essentially all the  tests were negative including the Meckels scan.  It was noted that a capsule endoscopy could be recommended, but at this time, this procedure is not performed at [facility].  The patients hemoglobin seemed to stabilize after he was transfused.  Hemoglobin went up to an 8.4, but essentially dropped again to a 7.5 in which he received another transfusion.  No further workup was performed since the patients hemoglobins did stabilize.  Surgeons and gastroenterology continued to follow the patient and they recommended an arteriogram if the patient had a rebleed. Fortunately, there was not another bleed during hospitalization.  #2 - Systemic lupus erythematous.  The patient had been followed at [facility] for this problem.  He also apparently consulted Dr. Cleophus Molt locally as well.  The patient was continued on Plaquenil at 200 mg twice a day as well as prednisone at 20 mg everyday.  On admission he was not initially placed on the prednisone, but this was restarted by day #3.  #3 - Warm autoantibody of which Dr. Nadene Rubins assisted in evaluation. It was noted that upon admission the patient was typed and screened with his warm autoantibody identified since his hemoglobin was noted to initially be 4, he was transfused two units of the least incompatible units of packed red blood cells.  His hemoglobin trended upward to a 5.8.  Once again, he received two more units of the least incompatible blood and the hemoglobin increased to 8.4 on January 25.  The patients hemoglobin trended down to a 7.5, he was transfused again with his hemoglobin leveling out at 8.1.  Multiple tests were ordered in order to evaluate the warm autoantibodies that were detected with the type and cross match.  His HIV was negative.  Haptoglobin was 205 which is  barely outside of the normal limits.  Infectious mononucleosis screen was negative.  No further workup was obtained through the hematology service as  I can find no more progress notes after the initial consult.  #4 - Hypertension.  Blood pressures remained high with a systolic ranging from [id number] to 190s during hospitalization.  The patient was placed on Norvasc and Hytrin which gave the patient better control.  It is likely that [name] will need to have his dry weights lowered during dialysis to optimize his blood pressure control.  His postweight at his last dialysis treatment was 89 kg.  #5 - Serratia Marsescens sepsis.  Blood cultures were drawn at admission and within 24 hours grew out this bacteria.  The patient was placed on Vancomycin and gentamicin at admission.  Zosyn was also begun on January 25.  The patients catheter was removed on January 27 with the catheter tip also growing colonies of Serratia.  The patient was kept without dialysis access until his catheter was placed by Dr. Donnetta Hutching on January 31.  His antibiotics were continued at discharge which was Tressie Ellis 2 grams IV that was to continue for four more hemodialysis treatments.  Tobramycin at 80 mg IV x4 hemodialysis treatments was  added as well for synergy.  #6 - End-stage renal disease.  [name] dialysed on January 24, 27, and 31 without any complications.  The patient has opted to remain [place] therefore he will dialyse at our [facility].  #7 - Anemia.   The patient received five units of packed red blood cells.  It is also noted that the patient has a warm autoantibody.  Please see #1 as well as #3.  The patient was also started on Infed which he was to receive doses 100 mg once every week.  Iron studies were performed during hospitalization with his transferrin saturations at 31% and his ferritin at 513.  DISCHARGE MEDICATIONS: 1. Nephrovite one p.o. q.d. 2. TUMS Ultra two tablets t.i.d. a.c. 3. Protonix 40 mg one p.o. q.a.m. 4. Plaquenil 200 mg p.o. b.i.d. 5. Norvasc 10 mg p.o. q.h.s. 6. Prednisone 20 mg p.o.  q.d. 7. Hytrin 5 mg one p.o. b.i.d.  DIET:  Kidney failure diet with 80 grams of protein, 2 grams sodium, and 2 grams potassium.  He is to limited to five 8 ounce cups per day.  WOUND CARE:  Access instructions of his new right IJ Diatek catheter; he was instructed to not shower and to keep the area dry.  DIALYSIS SCHEDULE:  Tuesday/Thursday/Saturday at the [facility] at 6:45 a.m.  FOLLOW-UP:  With [facility], phone number [phone] for permanent hemodialysis access after his blood cultures to be drawn on February 15 are negative.  With Nadene Rubins, M.D. in one month.  He is also to follow up with his rheumatologist at [facility].  ACTIVITY:  As tolerated.  KIDNEY CENTER INSTRUCTIONS:  Infed 100 mg IV q.week, Epogen 12,000 unit IVP TIW at hemodialysis.  Estimated dry weight 88 kg.  Fortaz 2 grams IV x4 hemodialysis treatments.  Tobramycin 80 mg IV x4 hemodialysis treatments. Surveillance blood cultures x2 should be drawn March 06, 2001.  Hemoglobins should be done q.hemodialysis treatment and evaluated by the physician assistant or the medical doctor.  NOTE:  [name] was not transfused again on discharge day, although, his hemoglobin was 7.8.  It was discussed with the physicians that this should be followed closely and no transfusions unless the patient was less than 7.5. This is all secondary to his warm autoantibody. Dictated by:   Vela Prose, P.A.C. Attending Physician:  Georgette Shell DD:  04/16/01 TD:  04/18/01 Job: [id number]

## 2010-06-07 NOTE — Discharge Summary (Signed)
[place]. [facility]  Patient:    [name], [name]                        MRN: [id number] Adm. Date:  [id number] Disc. Date: [id number] Attending:  Wynona Luna                           Discharge Summary  FINAL DIAGNOSES: 1. Lingular pneumonia, community-acquired, no organism specified. 2. Systemic lupus erythematosus. 3. Chronic renal failure secondary to systemic lupus erythematosus. 4. Hypertension, stable.  HISTORY OF PRESENT ILLNESS:  The patient is a 40 year old gentleman who has had a long-standing lupus.  Prior to moving to this area has actually developed sclerosing renal disease.  By the time he was seen here, repeat biopsy indicated no need for Cytoxan or further therapies, and he has gradually developed progressive renal insufficiency and is now also followed by the renal team.  He has had hypertension, which has been usually fairly well controlled but, on this admission, developed problems with about four days of fever and cough not producing much sputum.  Was seen in the office for these problems and was found to have a lingular pneumonia which was attempted to be treated as an outpatient.  However, by the following day, he continued to have fever and, because of episodes of immunosuppression and what appeared to be increasing pneumonitis, he was admitted to the hospital.  Echocardiogram had been done as an outpatient and showed no evidence of pericardial effusion or infection.  HOSPITAL COURSE:  He had a fairly benign hospital course, responding fairly promptly to Biaxin and Rocephin given parenterally, with defervescence and improvement in symptoms.  It was felt that he could be safely treated at home with continuation of oral Biaxin and possibly parenteral Rocephin, although probably the Biaxin alone was to be enough.  On the day of discharge, however, he left before these instructions had been given, but a follow-up has  been obtained after this note is being dictated, frankly, two or three weeks later, and he has done well recovering on the Biaxin without further pneumonitis and with clearing of his chest x-ray.  DISCHARGE MEDICATIONS: 1. Prednisone 15 mg a day. 2. Plaquenil 200 b.i.d. 3. Trazodone 50-100 q.h.s. 4. Biaxin 500 b.i.d. for 10 more days.  FOLLOW-UP:  He is to see Dr. Alvan Dame on Jun 20, 1999, and to follow up with Dr. Justine Null in two weeks.  LABORATORY DATA:  His EKG on admission showed voltage criteria for LVH, right ventricular conduction delay, ST-T changes of nonspecific type.  Chest x-ray showed pneumonia involving the lingula and left lower lobe.  White count was 16,900, hemoglobin 9.1, platelets 232,000, neutrophils 94, lymphs 0.5.  He had a sodium of 129, potassium borderline elevated at 5.6, chloride 102, CO2 20, glucose 113, BUN 55, creatinine 5.9, calcium 8.9, albumin 3.2.  C3 was really normal at 110, C4 was within normal range at 33. His urine was revealing substantial proteinuria greater than 300 mg/dl and granular casts, but no obvious infection.  Blood cultures were no growth. Urine for Legionella was negative.  Mycoplasma ______ antibody was within normal range, less than 204, not elevated.  DNA negative currently.  He clearly had improved by the time of discharge; however, his progressive problems with renal insufficiency and renal failure will continue to be major problems for him.  He will continue to follow up with nephrology and  with Dr. Justine Null. DD:  07/17/99 TD:  07/18/99 Job: [id number]

## 2010-06-07 NOTE — Procedures (Signed)
[place]. [facility]  Patient:    [name], [name] Visit Number: [id number] MRN: [id number]          Service Type: MED Location: [phone] Attending Physician:  Georgette Shell Dictated by:   Jim Desanctis, M.D. Proc. Date: 02/12/01 Admit Date:  02/12/2001   CC:         Windy Kalata, M.D.  Coralie Keens, M.D.   Procedure Report  PROCEDURE:  Colonoscopy.  INDICATIONS FOR PROCEDURE:  Acute GI bleed.  ANESTHESIA:  Demerol an additional 50 mg, Versed 7.5 mg additional.  DESCRIPTION OF PROCEDURE:  With the patient in the left lateral decubitus position, the Olympus videoscopic variable stiffness colonoscope was inserted in the rectum, passed through a rather blood filled colon. Landmarks were indistinguishable at this point and we reached the cecum. The cecum identified by the ileocecal valve and what appeared to be the crows foot of the cecum. We entered into the terminal ileum and advanced as far as I could comfortably go which was approximately 20-30 cm at which point we encountered blood staining of the entire small bowel mucosa. Whether this was reflux or whether it was primary is not clear. We did a biopsy, mucosa appeared grossly normal approximately 10 cm from the edge of the scope and the endoscope was withdrawn taking circumferential views of the remaining small bowel and colonic mucosa. The patients vital signs and pulse oximeter remained stable. The patient tolerated the procedure well without apparent complications.  FINDINGS:  Blood diffusely throughout the colon and into the small bowel although more marked in the colon than in the small bowel. Whether this was reflux of blood into the terminal ileum or was actually coming from above is not clear at this point, although I tend to favor the latter of a primary bleed from the small bowel. The patient had a colonoscopy three days ago at [facility] and findings were  just diffuse diverticulosis throughout the colon. Given that, we will reattempt bleeding scan which failed today because of technical difficulties. Will ask surgery to see and follow. Dictated by:   Jim Desanctis, M.D. Attending Physician:  Georgette Shell DD:  02/12/01 TD:  02/14/01 Job: [id number]

## 2010-06-07 NOTE — Consult Note (Signed)
[place]. [facility]  Patient:    [name], [name]                        MRN: [id number] Proc. Date: 10/30/99 Adm. Date:  [id number] Disc. Date: [id number] Attending:  Arlice Colt                          Consultation Report  HISTORY OF PRESENT ILLNESS:  We were asked by Dr. Justine Null to see this 40 year old gentleman with known chronic renal insufficiency.  He has a history of lupus with associated lupus nephritis.  He initially underwent a renal biopsy in April 1997, which showed a focal proliferative segmental GN.  Over the years despite prednisone therapy, he developed progressive disease, with a repeat biopsy in April 2000 showing WHO class 6 lupus nephritis, i.e., chronic sclerosing lupus, not amenable to further therapy with prednisone or cytotoxic drugs.  He has been followed in our office about every three months by Dr. Alvan Dame and most recently in June 2001 by Dr. Hassell Done.  BUN and creatinine at that time were 31 and 4.6.  Hematocrit was 9.7 and hematocrit 30.4.  The patient was told of the need to begin preparation for eventual dialysis access and per Dr. Lendell Caprice note, plans were to save his right arm for future access, etc.  He had a return appointment in July to solidify some of these follow-up plans but did not keep that appointment.  Review of serial creatinines from our office and from the hospital computer are as follows: January 2001, BUN 55, creatinine 4.3, hemoglobin 11.5. March 2001, BUN 31, creatinine 4.3, hemoglobin 10.8. May 2001, BUN 55, creatinine 5.9.  This was during a hospital admission for community-acquired pneumonia. June 2001, BUN 31, creatinine 4.6, hemoglobin 9.7.  As previously mentioned, he did not keep a follow-up appointment in July.  He is currently admitted with a nodular inflammatory process of his left medial thigh, etiology of which is unclear, although prior biopsy suggested inflammation versus infection, and it has  not been thought secondary to lupus. He has had outpatient antibiotic therapy but has had multiple antibiotic reactions, including an exfoliative reaction.  Because of the difficulties with diagnosis and treatment, he is admitted to the hospital now for infectious disease evaluation, MRI, and possible repeat biopsies.  On this occasion, his BUN and creatinine have been around 70 and 5.1.  His hemoglobin has dropped to 7, and he has a warm autoantibody, which has made the issue of transfusion somewhat more difficulty.  He has not yet been started on outpatient EPO therapy.  Of note is the fact that his hemoglobin on September 29 was 9.8 and is currently around 7.  PAST MEDICAL HISTORY:  Lupus diagnosed in 1996, hypertension, cigarette smoking (one pack per day), and multiple allergies and sensitivities to antibiotics, including CIPRO, SEPTA, and BETA-LACTAMS.  MEDICATIONS:  Prednisone 20 mg a day, nifedipine XL 30 mg a day, p.r.n. Vicodin for pain, and Ambien for sleep.  PHYSICAL EXAMINATION:  GENERAL:  He is a very nice, cushingoid black male.  VITAL SIGNS:  Blood pressure 156/88, temperature 97.3.  SKIN:  Conjunctivae and nail beds are quite pale.  NECK:  He has no JVD, no carotid bruits.  LUNGS:  The lung fields are clear.  HEART:  Precordium is very dynamic with a prominent left ventricular impulse, positive S4, normal S1, S2, no audible S3.  ABDOMEN:  Nondistended.  Bowel sounds are present.  He has diffuse abdominal striae.  There is no tenderness, and no masses are felt.  Femoral pulses are 2+.  EXTREMITIES:  There is a nodular, indurating process along the medial thigh from the groin to the knee, which is very tender.  Previous biopsy sites are noted.  There is no edema of the lower extremities.  Distal pulses are 2+ and equal.  LABORATORY DATA:  Sodium 141, potassium 4.6, chloride 108, CO2 22, BUN 78, creatinine 5.1.  Hemoglobin 7, down from 9.8, and hematocrit  20.7, down from 26 on September 28, WBC 14,000-17,000.  Calcium 8.6 with an albumin of 3.1. C3 75, C4 14.  HIV negative.  Sedimentation rate greater than 140.  Liver functions are normal.  Direct Coombs test is positive.  Urinalysis:  Specific gravity 1.016, pH of 6, 100 mg% protein, less than 5 white cells, and less than 5 reds.  IMPRESSION:  This is a gentleman with chronic renal insufficiency secondary to lupus with "burned-out" WHO 6 sclerosing glomerulonephritis.  Creatinine is around 5.1.  He missed his last appointment to begin making eventual dialysis plans.  He does not yet need dialysis, and obviously would not want to place access until this infectious versus inflammatory problem of the left lower extremity is clarified and treated, but we would certainly want to save his nondominant arm (right arm, since he is left-handed) for future potential hemodialysis access.  His anemia up until recently has been proportional to his renal disease, but the abrupt drop from 9.8 to 7.0 cannot be attributed to this.  He has antibodies which will make transfusion difficult, so although he is very likely to be resistant to the effects of erythropoietin, I would favor proceeding with administration of EPO at high doses to attempt to stimulate hemoglobin production.  RECOMMENDATIONS: 1. Save right arm.  No IVs or needle sticks. 2. Start daily EPO.  This can be modified if he responds, and obviously he    would need to convert to a bi-weekly or weekly dosing schedule at the time    of discharge simply for logistical reasons. 3. Check iron studies.  Would start oral iron empirically and treat with    parenteral iron if he has evidence of iron deficiency. 4. He does not wish to see the dialysis options videos at the present time but    does agree to watch them in the office in follow-up after this admission. 5. Unless his renal function were to become unstable, he probably does not    require  daily follow-up while in the hospital, but would ask that you not    hesitate to call if problems do arise. 6. When ready for discharge, please schedule a follow-up visit with either     myself or with Dr. Hassell Done in Dr. Honor Loh absence.  These issues are discussed with the patient, who has obvious reservations about eventual dialysis therapy and will require some additional education in this regard.  It will be important to emphasize compliance and follow-up.  Thanks for asking [place] to see him.  Call if additional problems arise. DD:  10/30/99 TD:  10/31/99 Job: [id number]

## 2010-06-07 NOTE — Consult Note (Signed)
[place]. [facility]  Patient:    ONESIMO, SAVINO Visit Number: [id number] MRN: [id number]          Service Type: MED Location: [id number] Attending Physician:  Georgette Shell Dictated by:   Windy Kalata, M.D. Proc. Date: 02/12/01 Admit Date:  02/12/2001   CC:         Roswell Miners, M.D.   Consultation Report  REASON FOR CONSULTATION:  Hyperkalemia, hypertension, and end-stage renal disease.  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 He was recently hospitalized at the [facility] for this same reason and apparently had upper and lower endoscopy performed, and "nothing was found."  We have none of those details available to [place] at the present time.  He has been on hemodialysis since October 2002 in [place] and is followed by [facility] nephrologist.  Apparently he does not feel comfortable going to [facility] for his health care and is actually interested in transferring his dialysis care to [place].  Apparently a right A-V fistula has been attempted in the past; however, it failed to mature.  He is currently using a PermCath.  PAST MEDICAL HISTORY:  Significant for lupus, hypertension, no acute distress benign prostatic hypertrophy.  ALLERGIES:  SULFA and CIPRO.  MEDICATIONS: 1. Amlodipine 10 mg a day. 2. Plaquenil 200 mg b.i.d. 3. Terazosin 5 mg a day. 4. Aciphex 20 mg a day. 5. Prednisone 20 mg a day.  SOCIAL HISTORY:  He has been a half-pack-per-day smoker for 12 years and denies alcohol use.  He lives in [place].  He is separated from his wife.  He has one child.  FAMILY HISTORY:  Negative for renal disease.  His father had hypertension.  REVIEW OF SYSTEMS:  Appetite had been good up until today.  He denies shortness of  breath.  No chest pain, no dysuria, no abdominal pain, no new arthritic complaints, no other skin lesions.  PHYSICAL EXAMINATION:  VITAL SIGNS:  Blood pressure 161/98, pulse 111, respirations 24, temperature 97.5.  GENERAL:  A healthy-appearing, 40 year old black male in no acute distress at the present time.  HEENT:  Sclerae nonicteric.  Extraocular muscles are intact.  NECK:  No JVD, no lymphadenopathy, no bruits.  LUNGS:  Clear to auscultation.  HEART:  Tachycardic and regular.  ABDOMEN:  Positive bowel sounds, nontender, nondistended, no hepatosplenomegaly.  EXTREMITIES:  Failed A-V fistula in his right forearm.  NEUROLOGIC:  No focal deficits.  LABORATORY DATA:  Sodium 143, potassium 5.8, BUN 86, creatinine 9.4, white count 15.8, platelet count 169,000, hemoglobin 5.3.  IMPRESSION: 1. Acute gastrointestinal bleed, most likely lower. 2. Hypertension. 3. Systemic lupus erythematosus. 4. End-stage renal disease secondary to systemic lupus erythematosus. 5. Anemia secondary to gastrointestinal bleed.  PLAN:  Will hemodialyze and give blood.  Will need to get information from [facility].  GI has been called to see the patient already by Dr. Justine Null.  He would like to transfer his care to [facility], and during his stay, will have social worker see him and make the appropriate arrangements.  Will hold his blood pressure medicines now until he is more hemodynamically stable. Dictated by:   Windy Kalata, M.D. Attending Physician:  Georgette Shell DD:  02/12/01 TD:  02/13/01 Job: [phone] [id number]

## 2010-06-07 NOTE — Op Note (Signed)
[place]. [facility]  Patient:    [name], [name] Visit Number: [id number] MRN: [id number]          Service Type: MED Location: [phone] Attending Physician:  Georgette Shell Dictated by:   Rosetta Posner, M.D. Proc. Date: 02/19/01 Admit Date:  02/12/2001 Discharge Date: 02/19/2001                             Operative Report  PREOPERATIVE DIAGNOSIS:  End-stage renal disease.  POSTOPERATIVE DIAGNOSIS:  End-stage renal disease.  PROCEDURE:  Placement of right internal jugular Diatek hemodialysis catheter.  SURGEON:  Rosetta Posner, M.D.  ASSISTANT:  Nurse.  ANESTHESIA:  MAC.  COMPLICATIONS:  None.  DISPOSITION:  To recovery room - stable.  Chest x-ray pending.  PROCEDURE IN DETAIL:  The patient was taken to the operating room, placed in position, where the area the right and left neck and chest prepped and draped in usual sterile fashion.  Using local anesthesia, the patient in the Trendelenburg position, and the finder needle, the right internal jugular vein was identified.  Next, using a Seldinger technique, a guidewire was passed down to the level of the right atrium.  A dilator was passed over this and the dilator and peel-away sheath was passed over the guidewire.  The dilator and guidewire were removed and the 28 cm Diatek catheter was passed down the peel-away sheath, which was then removed as well.  The catheter was positioned in the appropriate location in the right atrium.  A separate incision was made using local anesthesia in the chest wall exit site and the catheter was brought through the tunnel.  The two port hub was attached to the end of the catheter as directed.  Both lumens flushed and aspirated easily and were locked with 1000 unit/cc heparin.  The catheter was secured to the skin with 3-0 nylon stitch and the entry site was closed with a 4-0 subcuticular Vicryl stitch.  Sterile dressing was applied and the  patient was taken to the recovery room in stable condition. Dictated by:   Rosetta Posner, M.D. Attending Physician:  Georgette Shell DD:  02/19/01 TD:  02/19/01 Job: [id number]

## 2010-06-07 NOTE — H&P (Signed)
[place]. [facility]  Patient:    [name], [name] Visit Number: [id number] MRN: [id number]          Service Type: MED Location: [id number] [phone] Attending Physician:  Sol Blazing Dictated by:   Tonye Pearson, M.D. Admit Date:  05/16/2001   CC:         Lowella Bandy. Olevia Perches, M.D. [facility]   History and Physical  CHIEF COMPLAINT:  Abdominal pain and blood in stool.  HISTORY OF PRESENT ILLNESS:  This 40 year old African American male with end-stage renal disease secondary to systemic lupus erythematosus, on hemodialysis at [facility] [place], presents to the emergency department for bleeding from the rectum.  He had two stools yesterday, the first was normal, the second one he noticed a small amount of blood and this morning, he had some bright red blood from the rectum which was followed by cramps.  Usually, he has soft stools and does not strain at them.  He has not had fevers, prior abdominal pain, nausea, vomiting or diarrhea.  He does have a history of previous GI bleeds.  He states he has been on antibiotics for a "blood infection" which he received after hemodialysis treatment.  He was admitted April 24, 2001 through April 28, 2001 but I do not see any notation regarding IV antibiotics at that time.  His records will be obtained tomorrow from the dialysis center and antibiotics continued if indicated.  PAST MEDICAL HISTORY:  1. 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  He has a     right IJ catheter, a right A-V fistula which has failed.  He is     left-handed.  2. Hypertension.  3. SLE.  4. Anemia.  5. BPH.  6. History of GI bleed.  Most recent admission, January 2003.  Upper GI and     colonoscopy were negative; diffuse blood through the GI tract made     assessment difficult.  MEDICATIONS:  1. Hytrin 5 mg p.o.  b.i.d.  2. Calcium carbonate 500 mg two tabs p.o. t.i.d. with meals.  3. Protonix 40 mg p.o. q.d.  4. Nephro-Vite one p.o. q.d.  5. Metoprolol 50 mg p.o. b.i.d.  6. Calcitriol 0.5 mcg p.o. q.d.  7. Prednisone 7.5 mg p.o. q.d. taper.  8. Plaquenil 400 mg p.o. q.d.  9. Procardia 90 mg p.o. q.d. 10. Quinine sulfate 325 mg p.o. q.12h. p.r.n. cramps. 11. Epogen 15,000 units IV every hemodialysis, Monday, Wednesday and Friday.     His dry weight is 91.0 kg and he dialyzes for four hours.  ALLERGIES:  He is allergic to CIPRO and SULFAS.  SOCIAL HISTORY:  He lives in [place], [place].  He is separated and has a 46-year-old daughter.  Denies alcohol.  Tobacco:  Currently denies but old chart indicates half pack per day.  FAMILY HISTORY:  Mother deceased of aneurysm.  Father with Crohns and hypertension.  Two half brothers, one with a "heart condition," unspecified.  PHYSICAL EXAMINATION:  VITAL SIGNS:  Temperature 97.9, blood pressure 157/100, heart rate 90, respiratory rate 20, oxygen saturation 100% on room air.  GENERAL:  He is in no acute distress, was irritable and uncooperative with the exam.  HEENT:  EOMI, PERRL, moist mucosa.  NECK:  No lymphadenopathy.  No JVD.  CARDIOVASCULAR:  Regular rate and rhythm.  LUNGS:  Clear to auscultation.  No wheezes.  ABDOMEN:  Soft,  nondistended.  Diffusely tender, bilateral lower quadrants greater than upper.  EXTREMITIES:  There is no edema.  He has a right IJ catheter.  RECTAL:  Normal tone.  Guaiac-positive stool.  LABORATORY AND ACCESSORY DATA:  Sodium 139, potassium 5.4, chloride 102, CO2 26, BUN 40, creatinine 10.9, glucose 90.  WBC is 8.1, hemoglobin 8.8, hematocrit 26.0 with platelets 240,000.  PT 13.3, INR 1.0, PTT 34.  ASSESSMENT AND PLAN:  1. End-stage renal disease, on hemodialysis, Monday/Wednesday/Friday.  We     will dialyze him in the morning using no heparin because of his     gastrointestinal bleed.  We will  request records from [facility] to     clarify whether in fact he is on antibiotics.  2. Gastrointestinal bleed.  Dr. Lowella Bandy. Olevia Perches is following him.  He will     undergo CT scan of the abdomen with contrast today.  He had a scan three     weeks ago without contrast which was unrevealing and possibly repeat     endoscopy and colonoscopy in the morning.  Serial hemoglobins will be     followed.  If his hemoglobin drops further, would consider transfusion     with hemodialysis tomorrow.  3. Systemic lupus erythematosus.  Continue his Plaquenil and prednisone at     home dose and evidently he is on an outpatient taper of the prednisone but     will be held at his current dose for now.  4. Hypertension.  His blood pressure is slightly elevated today.  We will     continue his home medications.  5. Benign prostatic hypertrophy.  He is on Hytrin.  This will be continued as     an inpatient.  6. Nonadherent.  During admission process, the patient became very irritable     when told he would be on a clear liquid renal diet.  He states, "I never     follow that diet at home" and was unable to name most of his medications     or the dosages he regularly takes. Dictated by:   Tonye Pearson, M.D. Attending Physician:  Sol Blazing DD:  05/16/01 TD:  05/17/01 Job: [id number]

## 2010-06-07 NOTE — Discharge Summary (Signed)
[place]. [facility]  Patient:    [name], [name] Visit Number: [id number] MRN: [id number]          Service Type: MED Location: [place] Attending Physician:  Sol Blazing Dictated by:   Larey Dresser, M.D. Admit Date:  04/24/2001 Disc. Date: 04/28/01                             Discharge Summary  DATE OF BIRTH:  April 25, 1970  CONSULTS: 1. Dr. Lajoyce Corners, GI. 2. Dr. Scot Dock.  DISCHARGE MEDICATIONS:  1. Hytrin 5 mg p.o. b.i.d.  2. Calcium carbonate 2 p.o. t.i.d. with meals.  3. Protonix 40 q.d.  4. Nephro-Vite 1 p.o. q.d.  5. Metoprolol 50 p.o. b.i.d.  6. Calcitriol 0.5 mcg p.o. q.d.  7. Prednisone 10 p.o. q.d.  8. Plaquenil 400 p.o. q.d.  9. Procardia 90 mg p.o. q.d. 10. Epogen 15,000 units each hemodialysis treatment. 11. Quinine sulfate 325 q.12h. p.r.n.  DISCHARGE DIAGNOSES: 1. Gastrointestinal bleed, source unknown. 2. End-stage renal disease secondary to systemic lupus erythematosus. 3. Hypertension. 4. Mild prostatic hypertrophy. 5. Anemia secondary to #1 and also to chronic disease. 6. History of positive warm antibody.  HEMODIALYSIS INSTRUCTIONS:  Monday, Wednesday, Friday schedule.  Time four hours.  No heparin secondary to GI bleed.  Blood flow rate 400.  Dialysis set flow rate 800.  ______ bath.  Epogen 15,000 each treatment.  Estimated dry weight 91 kg.  The patient will need a CBC to monitor his hemoglobin with each dialysis treatment.  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  He came in with a history of five bright red blood per rectum bowel movements but was not associated with any stool.  He had no prior history of melena, hematochezia, abdominal pain, presyncope, chest pain, shortness of breath, or vomiting.  He denies alcohol, tobacco, nonsteroidal, or aspirin use.  He does use ibuprofen 200 p.r.n.Marland Kitchen  Denies fevers, chills, or sweats.  He did have some lower  abdominal pain with these episodes.  PAST MEDICAL HISTORY: 1. Lower GI bleed in January 2003.    a. Colonoscopy.  Blood was observed in the colon and to the small bowel,       but no source of bleeding could be located.    b. EGD showed mild redness without bleeding.    c. Meckel scan was negative.    d. Upper GI with small-bowel follow-through showed slight prolonged transit       time.    e. Bleeding scan showed limited diagnostic accuracy but otherwise negative.    f. Hemoglobin decreased to a low of 4.  He received a total of seven units       of packed red blood cells.  Hemoglobin at discharge in January was 7.8. 2. 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    a. ______ catheter right IJ.    b. Hemodialysis Monday, Wednesday, Friday since October 2002.    c. Filled right AV fistula.    d. Patient did not receive Cytoxan. 3. Hypotension. 4. Benign prostatic hypertrophy. 5. Positive warm antibody. 6. Anemia secondary to #1 and #2.    a. Iron studies:  Iron level 57, TIBC 184, % saturation 31, ferritin 573.  ADMISSION PHYSICAL EXAMINATION:  VITAL SIGNS:  Temperature 97.1, blood pressure 157/87, pulse 112, respiratory rate 20, O2 saturations 97% on room air.  The  patient was not orthostatic.  Pertinent positive and negative physical examination:  ABDOMEN:  Positive bowel sounds, soft, positive tenderness in the left lower quadrant.  ADMITTING LABORATORY DATA:  White blood cell 10.1, hemoglobin 11.1.  PT 12.9, INR 1.0, PTT 33.  Sodium 139, potassium 4.3, chloride 105, CO2 29, BUN 43, creatinine 11.7, glucose 96.  HOSPITAL COURSE: #1 - GASTROINTESTINAL BLEED:  The patient has had a thorough work-up in January 2003.  The source of his bleed was never localized.  The patient was hemodynamically stable on admission.  The patient was typed and crossed two units in case his hemoglobin fell.  The patient was started on Protonix.  The patient was made n.p.o. just in case GI needed  to intervene.  The patients hemoglobin decreased to a level of 9.3.  Hemoglobin on the day of discharge was 9.9.  The patient never required transfusion.  The patients Epogen was increased from 10,000 to 15,000 units secondary to his anemia.  Dr. Lajoyce Corners saw the patient in consultation on the morning of admission.  No immediate intervention was required, although Dr. Lajoyce Corners arranged for a Councill endoscopy as his most likely source of bleeding was his small bowel.  The patient started the Coun[facility]ill endoscopy on the morning of discharge, the 9th of April. Hemoglobin at time of discharge - 9.9.  #2 - END-STAGE RENAL DISEASE:  The patient was continued on his hemodialysis schedule.  Sodiums as above.  No complications.  #3 - HYPERTENSION:  The patient was continued on his antihypertensive after he was taking a regular diet.  #4 - BENIGN PROSTATIC HYPERTROPHY:  The patient continued on his Hytrin.  #5 - ANEMIA SECONDARY TO PROBLEMS #1 AND #2:  The patients Epogen was increased from 10,000 to 15,000 units secondary to his anemia.  The patient is on Rocaltrol 0.25 orally q.d.  #6 - HEMODIALYSIS ACCESS:  CVTS saw the patient in the hospital.  Dr. Scot Dock is arranging for the patient to have an outpatient hemodialysis access placed. He is planning on either a left upper arm AV fistula or AV graft as an outpatient. Dictated by:   Larey Dresser, M.D. Attending Physician:  Sol Blazing DD:  04/28/01 TD:  04/28/01 Job: [id number]

## 2010-06-07 NOTE — H&P (Signed)
[place]. [facility]  Patient:    [name], [name]                        MRN: [id number] Adm. Date:  [id number] Attending:  Wynona Luna Dictator:   Alben Spittle, R.N., G.N.P. CC:         Everlene Balls, M.D., infectious disease             Zella Richer. Alvan Dame, M.D., nephrology                         History and Physical  DATE OF BIRTH:  May 16, 1970  CHIEF COMPLAINT:  "I am weaker."  HISTORY OF PRESENT ILLNESS:  [name] is a 40 year old African-American male who was seen in our office on the 23rd with complaints of fever and cough and was found to have a diffuse infiltrate of the left lobe, also some cardiomegaly.  He was placed on Augmentin as an outpatient and came back yesterday for a 2-D echo which was preliminarily interpreted as within normal limits. Today, he is back; and his repeat chest x-ray showed more of a diffuse infiltrate. He is still having fevers of 103 degrees.  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 He also has some chronic anemia associated with his lupus.  Due to the fact that he has had worsening of his renal failure, nausea and vomiting, and continued fevers, he is admitted for IV antibiotics, infectious disease consult, and renal consult, as well as some IV fluids.  PAST MEDICAL HISTORY:  The patient denies any history of TB exposure. He denies any diarrhea. He has had prior treatment for respiratory illnesses during the winter but has been fairly stable. Unfortunately, his renal function has gotten worse chronically. He also has a history of hypertension with proteinuria. In the past, has had some discoid lesions of lupus. History of a benign lymph node biopsy in 1995.  ALLERGIES:  CIPRO and SEPTRA. I believe these caused a rash and desquamation of the skin of his hands.  SOCIAL  HISTORY:  Notable for a history of ETOH and alcohol use.  CURRENT MEDICATIONS: 1. Prednisone 5 mg daily. 2. Plaquenil 200 mg twice daily. 3. Nifedipine 30 mg daily. 4. Unfortunately, he has been taking some ibuprofen for his fever.  REVIEW OF SYSTEMS:  Positive for fever, chest pain, shortness of breath, anterior chest pressure, nausea and vomiting. His nausea improved greatly yesterday after he was given IM Phenergan and Depo-Medrol here in the office and he was able to keep down liquids overnight but he is still quite weak. As noted, he has had fevers of 103. He has had some skin rash with several new lesions in his groin area but no definite arthralgias and no synovitis. I did do a full review of systems with him today. He has not had any significant weight loss. He has been able to maintain his job. Even though he denied any significant weight loss as I look back in his chart, around about February of this year he weighed about 190 and he was weighing 179 in our office today.  FAMILY HISTORY:  Cancer in his mother involving cancer of the brain. Father with hypertension and a brother in good health.  PHYSICAL EXAMINATION:  VITAL SIGNS:  Temperature 100.8, blood pressure 118/64, O2  saturations are 98% on room air, with a pulse of 120.  LABORATORY DATA:  As noted, the chest x-ray was showing a diffuse infiltrate. His labs from the 23rd included a sed rate of 116. Potassium 5.2, BUN 42, and creatinine 5.2. This is in comparison to January when he was creatinine was 4.4 and his BUN was 38. His anemia has remained stable. Hemoglobin 10.7, his white count was 10.9, with 89% neutrophils on the differential.  HEENT:  Pharynx is clear.  NECK:  There is no cervical adenopathy.  CHEST:  Otherwise, on physician examination today I do not hear any rales or rhonchi on the lung exam.  HEART:  Regular. He is tachycardic. I do not hear a murmur.  EXTREMITIES:  No peripheral edema. He has  good peripheral pulses.  NEUROLOGICAL:  Within normal limits.  SKIN:  He has no new skin lesions.  IMPRESSION AND PLAN: 1. Diffuse left-sided pneumonia. 2. 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  PLAN:  As noted, we will go ahead and start him on IV fluids. After infectious disease sees him this afternoon, they will recommend an antibiotic. We will get urine for Legionella antigen and Mycoplasma, blood cultures, anti-DNA antibody, C3, C4, sputum; and we will continue him on his prednisone at a slightly higher dose at 30 mg daily and continue on his Plaquenil and nifedipine. We have also asked his nephrologist to see him today. DD:  06/14/99 TD:  06/14/99 Job: [id number]

## 2010-06-07 NOTE — Op Note (Signed)
   NAME:  [name], [name]                         ACCOUNT NO.:  [id number]   MEDICAL RECORD NO.:  [id number]                   PATIENT TYPE:  OIB   LOCATION:  2899                                 FACILITY:  [facility]   PHYSICIAN:  Judeth Cornfield. Scot Dock, M.D.        DATE OF BIRTH:  01-10-1971   DATE OF PROCEDURE:  08/20/2001  DATE OF DISCHARGE:  08/20/2001                                 OPERATIVE REPORT   PREOPERATIVE DIAGNOSIS:  Chronic renal failure.   POSTOPERATIVE DIAGNOSIS:  Chronic renal failure.   PROCEDURES:  1. Ultrasound of left internal jugular vein.  2. Placement of left internal jugular Diatek catheter (33 cm).   SURGEON:  Judeth Cornfield. Scot Dock, M.D.   ASSISTANT:  Nurse.   ANESTHESIA:  Local with sedation.   DESCRIPTION OF PROCEDURE:  The patient was taken to the operating room and  carefully positioned.  The patient had recently had an infected catheter  removed from the right; therefore, I selected a left-sided approach.  The  left IJ was identified with the ultrasound.  This was a nice, large vein.  This was marked.  The neck and upper chest were then prepped and draped in  the usual sterile fashion.  After the skin was infiltrated with 1%  lidocaine, the left internal jugular vein was cannulated and a guidewire  introduced into the superior vena cava under fluoroscopic control.  Next the  tract over the wire was dilated and then the dilator and peel-away sheath  were passed over the wire.  The dilator was then removed.  A 33 cm catheter  was passed over the wire, through the peel-away sheath, into the superior  vena cava.  The wire and peel-away sheath were removed.  The exit site for  the catheter was selected and the skin anesthetized between the two areas.  The catheter was then brought through the tunnel and the distal ports were  attached.  Both ports withdrew easily, were then flushed with heparinized  saline, and filled with concentrated heparin.  The  catheter was then secured  at its exit site with a 3-0 nylon suture.  The IJ cannulation site was  closed with a 4-0 subcuticular stitch.  A sterile dressing was applied and  the patient tolerated the procedure well and was transferred to the recovery  room in satisfactory condition.  All needle and sponge counts were correct.                                                Judeth Cornfield. Scot Dock, M.D.    CSD/MEDQ  D:  08/20/2001  T:  08/26/2001  Job:  [id number]

## 2010-06-07 NOTE — Consult Note (Signed)
[place]. [facility]  Patient:    [name], [name] Visit Number: [id number] MRN: [id number]          Service Type: MED Location: [id number] Attending Physician:  Georgette Shell Dictated by:   Mel Almond, N.P. Proc. Date: 02/16/01 Admit Date:  02/12/2001   CC:         Roswell Miners, M.D.  Jim Desanctis, M.D.  Windy Kalata, M.D.   Consultation Report  DATE OF BIRTH: 28-Feb-1970  REASON FOR CONSULTATION: Warm autoantibody.  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  His admission laboratory work showed a hemoglobin of 4.0, WBC 15.8, platelet count 169,000.  Sodium 140, potassium 6.1, BUN 92, creatinine 8.4, calcium 7.7, total protein 3.8, albumin 2.0, total bilirubin 0.4, SGOT 15, SGPT 7, alkaline phosphatase 43.  The patient was typed and screened with warm autoantibody identified.  He was transfused two units of least incompatible packed red blood cells, with hemoglobin up to 5.8.  He received two more units of lease incompatible blood, with his hemoglobin up to 8.4 on February 13, 2001.  On February 15, 2001 his hemoglobin declined again to 7.5 and he was subsequently transfused one unit packed red blood cells with his hemoglobin increasing to 9.3.  Today his hemoglobin is 8.1.  Upper endoscopy on February 12, 2001 was negative for blood loss.  Colonoscopy showed blood diffusely throughout the colon and small bowel.  Meckels scan was negative.  The small bowel is suspected as the source of bleeding.  Upper GI with small bowel follow-through was done on February 15, 2001, with results currently pending.  The patient denies any bleeding at present.  Of note, the patient was transfused at the [facility] approximately one week prior to admission here (he was admitted there with a GI  bleed).  PAST MEDICAL HISTORY:  1. SLE, on steroids.  2. End-stage renal disease secondary to #1.  3. Hypertension.  4. Pneumonia, May 2001.  MEDICATIONS:  1. Protonix 40 mg q.d.  2. Plaquenil 200 mg q.d.  3. Nephro-Vite q.d.  4. Norvasc 10 mg q.h.s.  5. Procrit 10,000 units three times weekly.  6. Prednisone 20 mg q.d.  7. Hytrin 5 mg q.d.  ALLERGIES:  1. CIPRO.  2. SULFA.  FAMILY HISTORY: Mother deceased with an aneurysm, father with Crohns. Half-brother who is healthy, and half-brother with a "heart condition."  SOCIAL HISTORY: [name] lives in [place], [place].  He is separated. He has one daughter, who is 50 years old and healthy.  He is disabled.  He reports tobacco use of five to six cigarettes per day.  He denies any EtOH use.  REVIEW OF SYSTEMS: The patient denies any weight loss or anorexia.  He has had no fevers.  He denies any pain.  He has had no shortness of breath or cough. He denies any chest pain.  He reports no known blood loss from his GI tract for the past five days.  He denies any hematuria or dysuria.  PHYSICAL EXAMINATION:  VITAL SIGNS: Temperature 97.7 degrees, heart rate 101, respirations 18, blood pressure 159/109.  GENERAL: Pleasant African-American male, lying in bed in no acute distress.  HEENT: Normocephalic, atraumatic.  PERRL.  EOMI.  Sclerae anicteric.  Mouth clear.  CHEST: Lungs clear.  Right upper chest dressing intact.  CARDIOVASCULAR: Regular rate and rhythm.  ABDOMEN: Soft, nontender.  No hepatosplenomegaly.  EXTREMITIES: No clubbing, cyanosis, or edema.  NEUROLOGIC: Alert and oriented x3.  Motor strength 5/5.  LABORATORY DATA: Hemoglobin 8.1, WBC 11.5, platelets 154,000.  Sodium 140, potassium 4.1, BUN 48, creatinine 8.1, glucose 117, calcium 8.3.  Iron 57, TIBC 184, per cent saturation 31; ferritin 513.  IMPRESSION: [name] is a 40 year old man, with end-stage renal disease secondary to systemic lupus  erythematosus with known warm antibody (IgG), treated in the past with an increased prednisone dose with resolution.  He has tolerated taper to 40-60 mg with now presentation of gastrointestinal bleed and transfusion dependency.  RECOMMENDATIONS:  1. Check LDH, hepatoglobin, and retic count.  2. Check mono spot/HIV.  3. Increase prednisone to 1 mg/kg until response, then begin a slow taper.  4. We could consider IVIG versus Cytoxan versus splenectomy if he cannot     tolerate.  We will continue to follow with you.  The patient was seen and examined by Dr. Nadene Rubins. Dictated by:   Mel Almond, N.P. Attending Physician:  Georgette Shell DD:  02/16/01 TD:  02/17/01 Job: [id number]

## 2010-06-07 NOTE — Discharge Summary (Signed)
[place]. [facility]  Patient:    [name], [name]                        MRN: [id number] Adm. Date:  [id number] Disc. Date: [id number] Attending:  Arlice Colt                           Discharge Summary  FINAL DIAGNOSES: 1. Cellulitis left thigh. 2. Systemic lupus erythematosus. 3. Chronic renal failure secondary to systemic lupus erythematosus. 4. Hypertension.  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  Venous Dopplers did not show any major deep vein thrombophlebitis, but his area of erythema began to spread.  He developed fever and came to the emergency room for that reason after being seen in the office the same day, but his diagnosis was not clear.  He appeared to have a cellulitis, was admitted to the hospital for that reason.  HOSPITAL COURSE:  His hospital course was fairly benign.  He did not have any fluid available for culture.  Blood cultures were negative.  Urine culture was unremarkable.  He seemed to respond initially to antibiotics with Zosyn, and his prednisone was increased to about 20 mg a day, and Plaquenil was continued.  He was normally on 5 mg of prednisone q.d.  After two days of improvement, he was anxious to go home.  It was felt that he could be continued on outpatient therapy with Augmentin and followed in the office the following day.  LABORATORY AND X-RAY DATA:  His laboratory data on admission revealed a white count of 13,600, hemoglobin 9.8, platelets 126,000.  He had 93% neutrophils on his differential.  His electrolytes were unremarkable except for BUN of 36, creatinine of 5.4 which is slightly  higher than usual for him (usual range about 4.1), and normal liver functions.  Urinalysis really was unremarkable except for proteinuria which had been previously recognized related to his SLE and renal disease.  Blood cultures were negative.  Urine culture was unremarkable.  CONDITION ON DISCHARGE:  He was felt stable for discharge on oral antibiotic.  DISCHARGE FOLLOWUP:  Will be followed in the office in one day regarding his lupus/renal disease. DD:  11/07/99 TD:  11/07/99 Job: [id number]

## 2010-06-07 NOTE — Procedures (Signed)
[place]. [facility]  Patient:    [name], [name] Visit Number: [id number] MRN: [id number]          Service Type: MED Location: [phone] Attending Physician:  Georgette Shell Dictated by:   Jim Desanctis, M.D. Proc. Date: 02/12/01 Admit Date:  02/12/2001                             Procedure Report  PROCEDURE:  Upper endoscopy.  INDICATIONS FOR PROCEDURE:  GI bleed.  ANESTHESIA:  Demerol 50, Versed 10 mg.  DESCRIPTION OF PROCEDURE:  With the patient mildly sedated in the left lateral decubitus position, the Olympus videoscopic endoscope was inserted in the mouth and passed under direct vision through the esophagus which appeared normal into the stomach. The fundus, body, and antrum all appeared normal. The duodenal bulb showed some changes of mild duodenitis and the second portion of the duodenum was normal. Bile was seen in the stomach and small intestines. From this point, the endoscope was slowly withdrawn taking circumferential views of the entire duodenal mucosa until the endoscope was then pulled back in the stomach and placed in retroflexion to view the stomach from below. The endoscope was then straightened and withdrawn taking circumferential views of the remaining gastric and esophageal mucosa which appeared normal. The patients vital signs and pulse oximeter remained stable. The patient tolerated the procedure well without apparent complications.  FINDINGS:  Mild duodenitis without evidence of upper gastrointestinal bleeding.  PLAN:  Proceed to colonoscopy. Dictated by:   Jim Desanctis, M.D. Attending Physician:  Georgette Shell DD:  02/12/01 TD:  02/14/01 Job: [id number]

## 2010-06-07 NOTE — Discharge Summary (Signed)
[place]. [facility]  Patient:    [name], [name] Visit Number: [id number] MRN: [id number]          Service Type: DSU Location: *N Attending Physician:  Dorothea Glassman Dictated by:   Myriam Jacobson, P.A.C. Adm. Date:  05/28/01 Disc. Date: 06/01/01   CC:         Gordy Clement, M.D.  Jim Desanctis, M.D.  Roswell Miners, M.D.   Discharge Summary  DISCHARGE DIAGNOSES: 1. Enterobacter sepsis. 2. Recent Stenotrophomonas sepsis. 3. Anemia. 4. End-stage renal disease. 5. Hyperkalemia. 6. Hypertension. 7. Right colon colitis. 8. Systemic lupus erythematosus.  CONSULTS: 1. Roswell Miners, M.D. 2. Gordy Clement, M.D. 3. Jim Desanctis, M.D.  PROCEDURES PERFORMED: 1. Transfusion 2 units packed red blood cells May 28, 2001. 2. Hemodialysis. 3. Capsule endoscopy May 31, 2001, Dr. Jim Desanctis. 4. Placement of right IJ Diateck catheter Jun 01, 2001, Dr. Drucie Opitz. 5. Abdominal CT scan, May 28, 2001, showed diverticulosis, splenomegaly and    decreased colon edema in the right colon.  HISTORY OF PRESENT ILLNESS:  The patient is a 40 year old African-American male who was admitted by Dr. Jonnie Finner on May 28, 2001. He has end-stage renal disease secondary to systemic lupus erythematosus and is on chronic dialysis Monday, Wednesday and Friday at the [facility]. He has been receiving Tressie Ellis for the last month for Stenotrophomonas maltophilia since around April 21, 2001, and also recently hospitalized on April 21, 2001, to the 29th with abdominal pain. At that time he underwent colonoscopy by Dr. Delfin Edis with the findings of ascending colonic ulcer and colitis on pathology. A CT scan of the abdomen at that time showed bowel wall thickening in the transverse colon, but worse in the right and cecum. A capsule endoscopy was recommended, but the patient left against medical advice.  Over the past week the patient has had post  dialysis fever and chills with temperatures ranging 99 to 102.4 degrees. The patient had fevers during dialysis while he was hospitalized as well. His blood cultures were repeated on May 24, 2001, and at the time of admission were growing Enterobacter which was resistant to penicillin and Tressie Ellis among other medications. It was sensitive to tobramycin and it was ordered to be dosed on his dialysis day, however, the patient awoke at 3 a.m. with diffuse abdominal pain and came to the emergency room, thereby missing his outpatient dialysis appointment. He describes his abdominal pain as burning and cramping. He had nausea and vomiting and diarrhea three to four times on Wednesday, May 26, 2001, but none since. The patient was admitted for treatment of his Enterobacter sepsis, management of his GI pain and catheter removal.  HOSPITAL COURSE:  #1 - ENTEROBACTER SEPSIS:  The patients catheter was removed by CVTS on the day of admission. He was treated with tobramycin and was continued on [place] for his previously diagnosed Stenotrophomonas until he finished his course of [place] during the hospitalization. The patients white count ran into the 10 to 11 range. He was able to have a right IJ Diateck catheter placed on Jun 01, 2001, by Dr. Amedeo Plenty. His potassium that day was 5.3. He did not wish to stay for dialysis. Therefore, he was given a dose of Kayexalate with instructions to follow up in his normal dialysis appointment on Jun 02, 2001. He will finish a course of two weeks more of tobramycin after discharge.  #2 - STENOTROPHOMONAS SEPSIS:  As described in #  1.  #3 - ANEMIA:  Hemoglobin on admission was 7.4, repeat was 7.2. He was transfused the same day 2 units of packed red blood cells. His post transfusion hemoglobin was 9.4. It gradually drifted down to 8.4, but he did not want dialysis on the 13th and was not transfused. Dr. Lajoyce Corners was following his hemoglobin as well during his  hospitalization. The patient did have a C-reactive protein done which was elevated at 10.7 with normals being less than 0.744. Iron studies showed a ferritin of 1211 and a 16% iron saturation. This will be followed in the outpatient setting and he will be transfused as needed. His iron will also be repleted. He will continued on Epogen 15,000 units IV every hemodialysis as well as tight heparin. Hemoglobins will be checked every treatment indefinitely until his problem is stabilized.  #4 - END-STAGE RENAL DISEASE:  The patient was hyperkalemic on several occasions with potassium being as high as 7.1 with a repeat of 6.5. He was treated with Kayexalate on several occasions with good response and subsequent potassium levels were 3.9 and 4.3, with his last being 5.3, at which time he was redosed again with Kayexalate at the time of discharge.  #5 - HYPERTENSION:  The patients blood pressure was fairly well controlled during his hospitalization considering he did not receive dialysis for volume removal except on the day of admission. His hypertension was managed with a multi-drug regimen, including Hytrin, Lopressor and Procardia; there were no changes in these medications.  #6 - RIGHT COLON COLITIS:  Dr. Lajoyce Corners saw the patient in consultation. A repeat CT scan showed improvement. Not sure whether this was the only reason for the patients abdominal pain. He was also seen by Dr. Justine Null, who recommended starting him on high-dose prednisone at 60 mg q.d. He was discharged on the same dose and it will need to be followed closely after he receives a course after discharge. A capsule endoscopy was done prior to discharge. The results are not in the chart at the time of this dictation. He was to follow up with Dr. Jim Desanctis. The patient is also to have a follow up abdominal CT scan in three to four weeks.   #7 - SYSTEMIC LUPUS ERYTHEMATOSUS:  The patient had a myriad of studies done relating to his  GI and lupus situation. The results were as follows: CA-19-19 was within normal limits as was CEA. Compliments were within normal limits as was haptoglobin. A CRP as previously mentioned was elevated at 10.7. Blood cultures showed no growth. Anti DNA was negative. ANCA was less than 1 to 16. Antiphospholipid evaluation was negative. Anti cardiolipin IgG was negative. IgM was high at 20, which was described as medium positive with a range of 20 to 80. Beta 2 glycoprotein 1 was also within normal limits.  DISPOSITION:  The patient was able to be discharged to home on Jun 01, 2001, in improved condition.  DISCHARGE MEDICATIONS:  1. Nephro-Vite 1 tablet q.d.  2. Calcium carbonate 500 mg 2 tablets t.i.d. with meals.  3. Hytrin 5 mg b.i.d.  4. Lopressor 50 mg b.i.d.  5. Procardia XL 90 mg q.h.s.  6. Plaquenil 200 mg 2 tablets q.d.  7. Prednisone 60 mg q.d.  8. Protonix 40 mg q.d.  9. Rocaltrol 0.5 mcg q.d. 10. Tobramycin 100 mg IV every hemodialysis x 7 more doses. 11. He is to take 4 ounces of Kayexalate when he returns home after discharge.  Special instructions for kidney center:  1. Use tight heparin. 2. Epogen 15,000 units IV every hemodialysis, estimated dry weight 88 kg. 3. InFeD 100 mg IV x 9 doses and weekly. 4. Stat tobramycin level Jun 04, 2001. Hemoglobins every treatment, last    hemoglobin at the time of discharge was 8.4.  DISCHARGE INSTRUCTIONS:  Discharge diet 90 gm protein, 2 gm sodium, 2 gm potassium, limit fluids to 5 cups per day. Access instructions, keep catheter site dry. Activity as tolerated.  FOLLOWUP:  Call Dr. Lajoyce Corners for appointment as well as Dr. Justine Null to determine longevity of prednisone. Dictated by:   Myriam Jacobson, P.A.C. Attending Physician:  Dorothea Glassman DD:  07/26/01 TD:  07/28/01 Job: [id number]

## 2010-06-07 NOTE — Op Note (Signed)
   NAME:  [name], [name]                         ACCOUNT NO.:  [id number]   MEDICAL RECORD NO.:  [id number]                   PATIENT TYPE:  EMS   LOCATION:  VASC                                 FACILITY:  [facility]   PHYSICIAN:  Judeth Cornfield. Scot Dock, M.D.        DATE OF BIRTH:  [birth year]   DATE OF PROCEDURE:  09/20/2001  DATE OF DISCHARGE:                                 OPERATIVE REPORT   PREOPERATIVE DIAGNOSIS:  Chronic renal failure.   POSTOPERATIVE DIAGNOSIS:  Chronic renal failure.   PROCEDURES:  1. Ultrasound of bilateral internal jugular veins.  2. Placement of right internal jugular Diatek catheter (28 cm).   SURGEON:  Judeth Cornfield. Scot Dock, M.D.   ANESTHESIA:  Local with sedation.   DESCRIPTION OF PROCEDURE:  The patient was taken to the operating room and  sedated by anesthesia.  By ultrasound, both internal jugular veins and both  were found to be patent.  These were marked.  The neck and upper chest were  then prepped and draped in the usual sterile fashion.  After the skin was  infiltrated with 1% lidocaine, the right internal jugular vein was  cannulated and a guidewire introduced into the superior vena cava under  fluoroscopic control.  The tract over the wire was dilated, and then the  dilator and peel-away sheath were passed over the wire and the wire and  dilator removed.  A 28 cm catheter was passed through the peel-away sheath  and positioned in the right atrium.  Both ports withdrew easily.  Next the  catheter was brought through the tunnel and the distal ports attached.  Both  ports withdrew easily when flushed with heparinized saline.  The catheter  was then filled with concentrated heparin.  The catheter was then secured at  its exit site with 3-0 nylon suture.  The IJ cannulation site was closed  with a 4-0 subcuticular stitch.  A sterile dressing was applied.  The  patient tolerated the procedure well and was transferred to the recovery  room in  satisfactory condition.  All needle and sponge counts were correct.                                               Judeth Cornfield. Scot Dock, M.D.    CSD/MEDQ  D:  09/20/2001  T:  09/21/2001  Job:  [id number]

## 2010-08-12 ENCOUNTER — Other Ambulatory Visit: Payer: Self-pay | Admitting: *Deleted

## 2010-08-12 DIAGNOSIS — Z940 Kidney transplant status: Secondary | ICD-10-CM

## 2010-08-16 ENCOUNTER — Ambulatory Visit
Admission: RE | Admit: 2010-08-16 | Discharge: 2010-08-16 | Disposition: A | Discharge status: Home / Self Care | Payer: Non-veteran care | Source: Ambulatory Visit | Attending: *Deleted | Admitting: *Deleted

## 2010-08-16 DIAGNOSIS — Z940 Kidney transplant status: Secondary | ICD-10-CM

## 2014-01-25 ENCOUNTER — Encounter (HOSPITAL_COMMUNITY): Payer: Non-veteran care

## 2014-02-07 ENCOUNTER — Other Ambulatory Visit (HOSPITAL_COMMUNITY): Payer: Self-pay | Admitting: *Deleted

## 2014-02-08 ENCOUNTER — Encounter (HOSPITAL_COMMUNITY)
Admission: RE | Admit: 2014-02-08 | Discharge: 2014-02-08 | Disposition: A | Discharge status: Home / Self Care | Payer: Non-veteran care | Source: Ambulatory Visit | Attending: Nephrology | Admitting: Nephrology

## 2014-02-08 DIAGNOSIS — Z5181 Encounter for therapeutic drug level monitoring: Secondary | ICD-10-CM | POA: Insufficient documentation

## 2014-02-08 DIAGNOSIS — D631 Anemia in chronic kidney disease: Secondary | ICD-10-CM | POA: Insufficient documentation

## 2014-02-08 LAB — PREPARE RBC (CROSSMATCH)

## 2014-02-09 ENCOUNTER — Other Ambulatory Visit (HOSPITAL_COMMUNITY): Payer: Self-pay | Admitting: *Deleted

## 2014-02-09 LAB — TYPE AND SCREEN
ABO/RH(D): O POS
ANTIBODY SCREEN: POSITIVE
DAT, IGG: POSITIVE

## 2014-02-10 ENCOUNTER — Inpatient Hospital Stay (HOSPITAL_COMMUNITY): Admission: RE | Admit: 2014-02-10 | Payer: Non-veteran care | Source: Ambulatory Visit

## 2014-02-13 ENCOUNTER — Encounter (HOSPITAL_COMMUNITY)
Admission: RE | Admit: 2014-02-13 | Discharge: 2014-02-13 | Disposition: A | Discharge status: Home / Self Care | Payer: Non-veteran care | Source: Ambulatory Visit | Attending: Nephrology | Admitting: Nephrology

## 2014-02-13 DIAGNOSIS — N189 Chronic kidney disease, unspecified: Secondary | ICD-10-CM | POA: Insufficient documentation

## 2014-02-13 DIAGNOSIS — D631 Anemia in chronic kidney disease: Secondary | ICD-10-CM | POA: Insufficient documentation

## 2014-02-13 LAB — PREPARE RBC (CROSSMATCH)

## 2014-02-13 MED ORDER — DIPHENHYDRAMINE HCL 25 MG PO CAPS: 25.0000 mg | ORAL_CAPSULE | Freq: Once | ORAL | Status: DC

## 2014-02-13 MED ORDER — ACETAMINOPHEN 325 MG PO TABS: 650.0000 mg | ORAL_TABLET | Freq: Once | ORAL | Status: DC

## 2014-02-13 MED ORDER — SODIUM CHLORIDE 0.9 % IV SOLN: Freq: Once | INTRAVENOUS | Status: DC

## 2014-02-13 NOTE — Progress Notes (Signed)
PT came in today for a blood transfusion.  After 5 hours of Type and Cross the only blood that the blood bank has is "least incompatiable" and emergency release.  I called [facility] and talked to Dr Moshe Cipro.  She checked his most recent HGb it was 7.  Pt is going to leave.  Dr Moshe Cipro will see him tomorrow at dialysis to discuss further options.

## 2014-02-17 LAB — TYPE AND SCREEN
ABO/RH(D): O POS
ANTIBODY SCREEN: POSITIVE
DAT, IgG: POSITIVE
Unit division: 0
Unit division: 0

## 2014-12-08 ENCOUNTER — Inpatient Hospital Stay
Admission: EM | Admit: 2014-12-08 | Discharge: 2014-12-10 | DRG: 377 | Disposition: A | Discharge status: Home / Self Care | Payer: Non-veteran care | Attending: Internal Medicine | Admitting: Internal Medicine

## 2014-12-08 ENCOUNTER — Encounter: Payer: Self-pay | Admitting: Emergency Medicine

## 2014-12-08 DIAGNOSIS — Z940 Kidney transplant status: Secondary | ICD-10-CM

## 2014-12-08 DIAGNOSIS — Z992 Dependence on renal dialysis: Secondary | ICD-10-CM

## 2014-12-08 DIAGNOSIS — F17200 Nicotine dependence, unspecified, uncomplicated: Secondary | ICD-10-CM | POA: Diagnosis present

## 2014-12-08 DIAGNOSIS — D631 Anemia in chronic kidney disease: Secondary | ICD-10-CM | POA: Diagnosis present

## 2014-12-08 DIAGNOSIS — Z7901 Long term (current) use of anticoagulants: Secondary | ICD-10-CM

## 2014-12-08 DIAGNOSIS — K635 Polyp of colon: Secondary | ICD-10-CM | POA: Diagnosis present

## 2014-12-08 DIAGNOSIS — D500 Iron deficiency anemia secondary to blood loss (chronic): Secondary | ICD-10-CM | POA: Diagnosis present

## 2014-12-08 DIAGNOSIS — N2581 Secondary hyperparathyroidism of renal origin: Secondary | ICD-10-CM | POA: Diagnosis present

## 2014-12-08 DIAGNOSIS — K922 Gastrointestinal hemorrhage, unspecified: Secondary | ICD-10-CM | POA: Diagnosis not present

## 2014-12-08 DIAGNOSIS — D649 Anemia, unspecified: Secondary | ICD-10-CM | POA: Diagnosis not present

## 2014-12-08 DIAGNOSIS — F329 Major depressive disorder, single episode, unspecified: Secondary | ICD-10-CM | POA: Diagnosis present

## 2014-12-08 DIAGNOSIS — N186 End stage renal disease: Secondary | ICD-10-CM | POA: Diagnosis present

## 2014-12-08 DIAGNOSIS — Z905 Acquired absence of kidney: Secondary | ICD-10-CM

## 2014-12-08 DIAGNOSIS — Z79899 Other long term (current) drug therapy: Secondary | ICD-10-CM

## 2014-12-08 DIAGNOSIS — Z8249 Family history of ischemic heart disease and other diseases of the circulatory system: Secondary | ICD-10-CM

## 2014-12-08 DIAGNOSIS — M329 Systemic lupus erythematosus, unspecified: Secondary | ICD-10-CM | POA: Diagnosis present

## 2014-12-08 DIAGNOSIS — Z888 Allergy status to other drugs, medicaments and biological substances status: Secondary | ICD-10-CM

## 2014-12-08 DIAGNOSIS — Z882 Allergy status to sulfonamides status: Secondary | ICD-10-CM

## 2014-12-08 HISTORY — DX: Reserved for concepts with insufficient information to code with codable children: IMO0002

## 2014-12-08 HISTORY — DX: Disorder of kidney and ureter, unspecified: N28.9

## 2014-12-08 HISTORY — DX: Anemia, unspecified: D64.9

## 2014-12-08 HISTORY — DX: Kidney transplant status: Z94.0

## 2014-12-08 HISTORY — DX: Systemic lupus erythematosus, unspecified: M32.9

## 2014-12-08 LAB — BASIC METABOLIC PANEL
Anion gap: 11 (ref 5–15)
BUN: 57 mg/dL — ABNORMAL HIGH (ref 6–20)
CALCIUM: 8.4 mg/dL — AB (ref 8.9–10.3)
CHLORIDE: 94 mmol/L — AB (ref 101–111)
CO2: 30 mmol/L (ref 22–32)
CREATININE: 6.28 mg/dL — AB (ref 0.61–1.24)
GFR calc non Af Amer: 10 mL/min — ABNORMAL LOW (ref >60)
GFR, EST AFRICAN AMERICAN: 11 mL/min — AB (ref >60)
GLUCOSE: 57 mg/dL — AB (ref 65–99)
Potassium: 5.1 mmol/L (ref 3.5–5.1)
Sodium: 135 mmol/L (ref 135–145)

## 2014-12-08 LAB — CBC
HCT: 13.2 % — CL (ref 40.0–52.0)
Hemoglobin: 4.3 g/dL — CL (ref 13.0–18.0)
MCH: 30.1 pg (ref 26.0–34.0)
MCHC: 32.7 g/dL (ref 32.0–36.0)
MCV: 92 fL (ref 80.0–100.0)
PLATELETS: 86 10*3/uL — AB (ref 150–440)
RBC: 1.43 MIL/uL — AB (ref 4.40–5.90)
RDW: 17.7 % — ABNORMAL HIGH (ref 11.5–14.5)
WBC: 7.9 10*3/uL (ref 3.8–10.6)

## 2014-12-08 LAB — GLUCOSE, CAPILLARY: GLUCOSE-CAPILLARY: 61 mg/dL — AB (ref 65–99)

## 2014-12-08 MED ORDER — MORPHINE SULFATE (PF) 4 MG/ML IV SOLN
4.0000 mg | Freq: Once | INTRAVENOUS | Status: AC
  Administered 2014-12-08: 4 mg via INTRAVENOUS
  Filled 2014-12-08: qty 1

## 2014-12-08 MED ORDER — SODIUM CHLORIDE 0.9 % IV SOLN
10.0000 mL/h | Freq: Once | INTRAVENOUS | Status: AC
  Administered 2014-12-08: 10 mL/h via INTRAVENOUS

## 2014-12-08 NOTE — ED Notes (Signed)
Pt. Is here via EMS from home for weakness.  Pt. States he get dialysis T,Th, S.  Pt. States blood pressure dropped on Thursday during dialysis.  Pt. States he was given a fluid bolus to increase blood pressure.  Pt. States weakness that started today in the a.m.   Pt. States he was started on warfarin 3 months ago for blood clot in fistula on rt. Arm.

## 2014-12-08 NOTE — ED Notes (Signed)
Pt. States he is a dialysis patient.  Pt. States he has dialysis T, Th, and Saturday.  Pt. States Blood pressure dropped during dialysis yesterday.  Pt. Given fluid bolus and sent home.  Pt. States he woke up with weakness this a.m.  Pt. States he is a non-diabetic.  Pt. 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

## 2014-12-08 NOTE — ED Provider Notes (Addendum)
[facility] Emergency Department Provider Note  Time seen: 8:48 PM  I have reviewed the triage vital signs and the nursing notes.   HISTORY  Chief Complaint Weakness    HPI [name] is a 44 y.o. male with a past medical history of end-stage renal disease on hemodialysis Tuesday/Thursday/Saturday,lupus, anemia, who presents to the emergency department with generalized weakness. According to the patient he began feeling somewhat weak yesterday, they gave him extra fluid after dialysis to attempt to help however today his weakness has worsened significantly and he is too weak to get out of bed by himself. He states at baseline he can ambulate by himself without the use of cane or walker. Patient states a history of low blood levels/anemia. He denies any black or bloody stool, dysuria, abdominal pain or chest pain. Denies any recent fevers. States he will occasionally make urine every 2-3 days for which he self catheters. Describes his weakness as significant.     Past Medical History  Diagnosis Date  . Lupus ([place])   . Renal disorder   . Renal transplant recipient   . Anemia     There are no active problems to display for this patient.   Past Surgical History  Procedure Laterality Date  . Nephrectomy transplanted organ      Current Outpatient Rx  Name  Route  Sig  Dispense  Refill  . warfarin (COUMADIN) 5 MG tablet   Oral   Take 5 mg by mouth 4 (four) times a week.           Allergies Ciprofloxacin and Sulfa antibiotics  No family history on file.  Social History Social History  Substance Use Topics  . Smoking status: Current Every Day Smoker -- 0.50 packs/day  . Smokeless tobacco: None  . Alcohol Use: No    Review of Systems Constitutional: Negative for fever. Positive generalized weakness. Cardiovascular: Negative for chest pain. Respiratory: Negative for shortness of breath. Gastrointestinal: Negative for abdominal  pain Musculoskeletal: Negative for back pain. Neurological: Negative for headache 10-point ROS otherwise negative.  ____________________________________________   PHYSICAL EXAM:  VITAL SIGNS: ED Triage Vitals  Enc Vitals Group     BP 12/08/14 1924 117/58 mmHg     Pulse Rate 12/08/14 1924 88     Resp 12/08/14 1924 16     Temp 12/08/14 1924 99.7 F (37.6 C)     Temp Source 12/08/14 1924 Oral     SpO2 12/08/14 1924 97 %     Weight 12/08/14 1924 190 lb (86.183 kg)     Height 12/08/14 1924 6\' 2"  (1.88 m)     Head Cir --      Peak Flow --      Pain Score 12/08/14 2013 6     Pain Loc --      Pain Edu? --      Excl. in [place]? --     Constitutional: Alert and oriented. Well appearing and in no distress. Slow responses, possible mild confusion although oriented 4. Eyes: Normal exam ENT   Head: Normocephalic and atraumatic.   Mouth/Throat: Mucous membranes are moist. Cardiovascular: Normal rate, regular rhythm. No murmur Respiratory: Normal respiratory effort without tachypnea nor retractions. Breath sounds are clear Gastrointestinal: Soft and nontender. No distention.  Rectal exam shows black stool, strongly guaiac positive. Musculoskeletal: Nontender with normal range of motion in all extremities.  Neurologic:  Normal speech and language. No gross focal neurologic deficits  Skin:  Skin is warm,  dry and intact.  Psychiatric: Mood and affect are normal. Speech and behavior are normal.   ____________________________________________    EKG  EKG reviewed and interpreted by myself shows sinus rhythm at 89 bpm, narrow QRS, normal axis, largely normal intervals, nonspecific ST changes present.  ____________________________________________    INITIAL IMPRESSION / ASSESSMENT AND PLAN / ED COURSE  Pertinent labs & imaging results that were available during my care of the patient were reviewed by me and considered in my medical decision making (see chart for  details).  Patient presents the emergency department with generalized weakness. Rectal exam shows black stool strongly guaiac positive. Hemoglobin of 4.3. We have initiated blood transfusions. No gross blood on rectal exam. I discussed risks and medical of blood transfusion with the patient which she is agreeable. Patient is a [place] patient and wishes to be transferred. I believe once the patient has received blood products he will be safe and stable for transfer. We will continue to monitor closely in the emergency department.  Patient care signed out to Dr. Joni Fears [facility].  CRITICAL CARE Performed by: Harvest Dark   Total critical care time: 30 minutes   Critical care time was exclusive of separately billable procedures and treating other patients.  Critical care was necessary to treat or prevent imminent or life-threatening deterioration.  Critical care was time spent personally by me on the following activities: development of treatment plan with patient and/or surrogate as well as nursing, discussions with consultants, evaluation of patient's response to treatment, examination of patient, obtaining history from patient or surrogate, ordering and performing treatments and interventions, ordering and review of laboratory studies, ordering and review of radiographic studies, pulse oximetry and re-evaluation of patient's condition.   ____________________________________________   FINAL CLINICAL IMPRESSION(S) / ED DIAGNOSES  GI bleed Symptomatic anemia   Harvest Dark, MD 12/08/14 [id number]  Harvest Dark, MD 12/08/14 2219

## 2014-12-08 NOTE — ED Notes (Signed)
Pt. States he is a [employer] patient.

## 2014-12-08 NOTE — ED Notes (Signed)
Pt. Given some OJ and crackers and peanut butter.  Pt. Watching tv in room.

## 2014-12-09 ENCOUNTER — Encounter: Payer: Self-pay | Admitting: Internal Medicine

## 2014-12-09 DIAGNOSIS — M329 Systemic lupus erythematosus, unspecified: Secondary | ICD-10-CM | POA: Diagnosis present

## 2014-12-09 DIAGNOSIS — K922 Gastrointestinal hemorrhage, unspecified: Secondary | ICD-10-CM | POA: Diagnosis present

## 2014-12-09 DIAGNOSIS — Z8249 Family history of ischemic heart disease and other diseases of the circulatory system: Secondary | ICD-10-CM | POA: Diagnosis not present

## 2014-12-09 DIAGNOSIS — Z882 Allergy status to sulfonamides status: Secondary | ICD-10-CM | POA: Diagnosis not present

## 2014-12-09 DIAGNOSIS — Z905 Acquired absence of kidney: Secondary | ICD-10-CM | POA: Diagnosis not present

## 2014-12-09 DIAGNOSIS — Z992 Dependence on renal dialysis: Secondary | ICD-10-CM | POA: Diagnosis not present

## 2014-12-09 DIAGNOSIS — D500 Iron deficiency anemia secondary to blood loss (chronic): Secondary | ICD-10-CM | POA: Diagnosis present

## 2014-12-09 DIAGNOSIS — F329 Major depressive disorder, single episode, unspecified: Secondary | ICD-10-CM | POA: Diagnosis present

## 2014-12-09 DIAGNOSIS — K635 Polyp of colon: Secondary | ICD-10-CM | POA: Diagnosis present

## 2014-12-09 DIAGNOSIS — Z940 Kidney transplant status: Secondary | ICD-10-CM | POA: Diagnosis not present

## 2014-12-09 DIAGNOSIS — N2581 Secondary hyperparathyroidism of renal origin: Secondary | ICD-10-CM | POA: Diagnosis present

## 2014-12-09 DIAGNOSIS — N186 End stage renal disease: Secondary | ICD-10-CM | POA: Diagnosis present

## 2014-12-09 DIAGNOSIS — Z7901 Long term (current) use of anticoagulants: Secondary | ICD-10-CM | POA: Diagnosis not present

## 2014-12-09 DIAGNOSIS — Z79899 Other long term (current) drug therapy: Secondary | ICD-10-CM | POA: Diagnosis not present

## 2014-12-09 DIAGNOSIS — D649 Anemia, unspecified: Secondary | ICD-10-CM | POA: Diagnosis present

## 2014-12-09 DIAGNOSIS — D631 Anemia in chronic kidney disease: Secondary | ICD-10-CM | POA: Diagnosis present

## 2014-12-09 DIAGNOSIS — Z888 Allergy status to other drugs, medicaments and biological substances status: Secondary | ICD-10-CM | POA: Diagnosis not present

## 2014-12-09 DIAGNOSIS — F17200 Nicotine dependence, unspecified, uncomplicated: Secondary | ICD-10-CM | POA: Diagnosis present

## 2014-12-09 LAB — RENAL FUNCTION PANEL
ANION GAP: 11 (ref 5–15)
Albumin: 2.5 g/dL — ABNORMAL LOW (ref 3.5–5.0)
BUN: 79 mg/dL — ABNORMAL HIGH (ref 6–20)
CHLORIDE: 92 mmol/L — AB (ref 101–111)
CO2: 30 mmol/L (ref 22–32)
Calcium: 8.4 mg/dL — ABNORMAL LOW (ref 8.9–10.3)
Creatinine, Ser: 7.98 mg/dL — ABNORMAL HIGH (ref 0.61–1.24)
GFR, EST AFRICAN AMERICAN: 8 mL/min — AB (ref >60)
GFR, EST NON AFRICAN AMERICAN: 7 mL/min — AB (ref >60)
Glucose, Bld: 92 mg/dL (ref 65–99)
POTASSIUM: 6.1 mmol/L — AB (ref 3.5–5.1)
Phosphorus: 8.3 mg/dL — ABNORMAL HIGH (ref 2.5–4.6)
Sodium: 133 mmol/L — ABNORMAL LOW (ref 135–145)

## 2014-12-09 LAB — CBC
HEMATOCRIT: 16 % — AB (ref 40.0–52.0)
HEMOGLOBIN: 5.4 g/dL — AB (ref 13.0–18.0)
MCH: 30.9 pg (ref 26.0–34.0)
MCHC: 33.6 g/dL (ref 32.0–36.0)
MCV: 92 fL (ref 80.0–100.0)
Platelets: 76 10*3/uL — ABNORMAL LOW (ref 150–440)
RBC: 1.74 MIL/uL — ABNORMAL LOW (ref 4.40–5.90)
RDW: 17.3 % — AB (ref 11.5–14.5)
WBC: 6.2 10*3/uL (ref 3.8–10.6)

## 2014-12-09 LAB — PREPARE RBC (CROSSMATCH)

## 2014-12-09 LAB — PROTIME-INR
INR: 2.08
Prothrombin Time: 23.2 seconds — ABNORMAL HIGH (ref 11.4–15.0)

## 2014-12-09 LAB — GLUCOSE, CAPILLARY: Glucose-Capillary: 92 mg/dL (ref 65–99)

## 2014-12-09 LAB — HEMOGLOBIN A1C: Hgb A1c MFr Bld: UNDETERMINED % (ref 4.0–6.0)

## 2014-12-09 LAB — TSH: TSH: 1.924 u[IU]/mL (ref 0.350–4.500)

## 2014-12-09 MED ORDER — DIPHENHYDRAMINE HCL 25 MG PO CAPS: 25.0000 mg | ORAL_CAPSULE | Freq: Four times a day (QID) | ORAL | Status: DC | PRN

## 2014-12-09 MED ORDER — LIDOCAINE-PRILOCAINE 2.5-2.5 % EX CREA
1.0000 "application " | TOPICAL_CREAM | CUTANEOUS | Status: DC | PRN
  Filled 2014-12-09: qty 5

## 2014-12-09 MED ORDER — WARFARIN SODIUM 5 MG PO TABS: 5.0000 mg | ORAL_TABLET | ORAL | Status: DC

## 2014-12-09 MED ORDER — PENTAFLUOROPROP-TETRAFLUOROETH EX AERO
1.0000 "application " | INHALATION_SPRAY | CUTANEOUS | Status: DC | PRN
  Filled 2014-12-09: qty 30

## 2014-12-09 MED ORDER — HEPARIN SODIUM (PORCINE) 1000 UNIT/ML DIALYSIS
1000.0000 [IU] | INTRAMUSCULAR | Status: DC | PRN
  Filled 2014-12-09: qty 1

## 2014-12-09 MED ORDER — MORPHINE SULFATE (PF) 2 MG/ML IV SOLN
1.0000 mg | INTRAVENOUS | Status: DC | PRN
  Administered 2014-12-09 – 2014-12-10 (×6): 1 mg via INTRAVENOUS
  Filled 2014-12-09 (×6): qty 1

## 2014-12-09 MED ORDER — SODIUM CHLORIDE 0.9 % IV SOLN: 100.0000 mL | INTRAVENOUS | Status: DC | PRN

## 2014-12-09 MED ORDER — ONDANSETRON HCL 4 MG/2ML IJ SOLN: 4.0000 mg | Freq: Four times a day (QID) | INTRAMUSCULAR | Status: DC | PRN

## 2014-12-09 MED ORDER — DOCUSATE SODIUM 100 MG PO CAPS
100.0000 mg | ORAL_CAPSULE | Freq: Two times a day (BID) | ORAL | Status: DC
Start: 2014-12-09 — End: 2014-12-10
  Filled 2014-12-09 (×2): qty 1

## 2014-12-09 MED ORDER — HEPARIN SODIUM (PORCINE) 1000 UNIT/ML IJ SOLN
1000.0000 [IU] | Freq: Once | INTRAMUSCULAR | Status: DC
  Filled 2014-12-09: qty 1

## 2014-12-09 MED ORDER — SODIUM CHLORIDE 0.9 % IV SOLN: Freq: Once | INTRAVENOUS | Status: DC

## 2014-12-09 MED ORDER — ONDANSETRON HCL 4 MG PO TABS: 4.0000 mg | ORAL_TABLET | Freq: Four times a day (QID) | ORAL | Status: DC | PRN

## 2014-12-09 MED ORDER — ACETAMINOPHEN 325 MG PO TABS: 650.0000 mg | ORAL_TABLET | Freq: Four times a day (QID) | ORAL | Status: DC | PRN

## 2014-12-09 MED ORDER — LIDOCAINE HCL (PF) 1 % IJ SOLN
5.0000 mL | INTRAMUSCULAR | Status: DC | PRN
  Filled 2014-12-09: qty 5

## 2014-12-09 MED ORDER — SODIUM CHLORIDE 0.9 % IJ SOLN
3.0000 mL | Freq: Two times a day (BID) | INTRAMUSCULAR | Status: DC
  Administered 2014-12-09 (×2): 3 mL via INTRAVENOUS

## 2014-12-09 MED ORDER — ALTEPLASE 2 MG IJ SOLR
2.0000 mg | Freq: Once | INTRAMUSCULAR | Status: DC | PRN
  Filled 2014-12-09: qty 2

## 2014-12-09 MED ORDER — ACETAMINOPHEN 650 MG RE SUPP: 650.0000 mg | Freq: Four times a day (QID) | RECTAL | Status: DC | PRN

## 2014-12-09 NOTE — Consult Note (Signed)
Pt seen by Claudie Leach PA and discussed case at bedside of patient. Hx of bleeding and severe anemia. He had a work up 2 years ago at the [place] and upper and lower were neg.  I don't think he had a capsule endoscopy but am not sure.  He wishes to go to the [place] due to expense and this makes good sense.  Until he is transferred we will address his needs such as dialysis and transfusions which are essential to his care.

## 2014-12-09 NOTE — Consult Note (Signed)
GI Inpatient Consult Note  Reason for Consult: Symptomatic anemia, heme positive stool   Attending Requesting Consult: Dr. Marcille Blanco  History of Present Illness: [name] is a 44 y.o. male he reports that he has had dark stools for the last few days, reporting his stools prior were brown.  His last bowel movement was Friday morning. He denies diarrhea.  He reports he went to dialysis on Thursday and they were not able to get his blood pressure up, after 2 hours of saline he was able to leave, he went to get something to eat and went home.  He reports the next day he went to the kitchen in his legs gave out underneath him, he decided to stay home for the day and then continued to get worse, he got the point were he was unable to stand up, so he called 911.  His hemoglobin on arrival to ED was 4.3, MCV 92, platelets 86.  He also was found to have heme-positive stool.  He has been given 2 units of blood, his last 1 was finishing during our interview.  He has a significant history of a GI bleed approximately 2 years ago, reports he had an upper endoscopy, colonoscopy, as well as small bowel studies without any findings of the source of bleeding.  This was completed at the [place] in [place], we do not have records of this.  He does report he had colon polyps but was told were not urgent.  He was diagnosed with lupus in 1994, he has been told this is caused his kidney issues.  He had a kidney transplant in 2009 that left both kidneys in and added a third.  He is currently end-stage renal disease, dialysis on Tuesdays, as Thursday, and Saturdays.  Thursday and was his last dialysis.  He reports that he was started on warfarin approximately 3 months ago due to a blood clot when they removed his PermCath on the right side.  He reports the warfarin has been increased in the past week to 5 days at 5 mg a day and 2 days at 2-1/2 mg a day.  This was also done at the [place] in [place].  His last dose of  Warfarin  was Thursday at 3:00 p.m..  He denies any NSAID use,takes omeprazole 20 mg daily, denies heartburn acid reflux symptoms, denies abdominal pain.  He reports he receives his pain medication from the [place], and uses morphine for his pain on a daily basis.   Past Medical History:  Past Medical History  Diagnosis Date  . Lupus ([place])   . Renal disorder   . Renal transplant recipient   . Anemia     Problem List: Patient Active Problem List   Diagnosis Date Noted  . GI bleed 12/09/2014    Past Surgical History: Past Surgical History  Procedure Laterality Date  . Nephrectomy transplanted organ      Allergies: Allergies  Allergen Reactions  . Ciprofloxacin   . Sulfa Antibiotics     Home Medications: Prescriptions prior to admission  Medication Sig Dispense Refill Last Dose  . hydroxychloroquine (PLAQUENIL) 200 MG tablet Take 200 mg by mouth 2 (two) times daily.   unknown at unknown  . omeprazole (PRILOSEC) 20 MG capsule Take 20 mg by mouth daily.   unknown at unknown  . PREDNISONE PO Take 15 mg by mouth daily.     . sertraline (ZOLOFT) 100 MG tablet Take 200 mg by mouth at bedtime.   unknown  at unknown  . tamsulosin (FLOMAX) 0.4 MG CAPS capsule Take 0.8 mg by mouth at bedtime.   unknown at unknown  . warfarin (COUMADIN) 5 MG tablet Take 5 mg by mouth 4 (four) times a week. 2.5 mg the other days   12/08/2014 at Unknown time   Home medication reconciliation was completed with the patient.   Scheduled Inpatient Medications:   . docusate sodium  100 mg Oral BID  . sodium chloride  3 mL Intravenous Q12H    Continuous Inpatient Infusions:     PRN Inpatient Medications:  acetaminophen **OR** acetaminophen, morphine injection, ondansetron **OR** ondansetron (ZOFRAN) IV  Family History: family history includes Hypertension in his other.    Social History:   reports that he has been smoking.  He does not have any smokeless tobacco history on file. He reports that he does not drink  alcohol.    Review of Systems: Constitutional: Weight is stable.  Eyes: No changes in vision. ENT: No oral lesions, sore throat.  GI: see HPI.  Heme/Lymph: No easy bruising.  CV: No chest pain.  GU: No hematuria.  Integumentary: No rashes.  Neuro: No headaches.  Psych: No depression/anxiety.  Endocrine: No heat/cold intolerance.  Allergic/Immunologic: No urticaria.  Resp: No cough.  Musculoskeletal: No joint swelling.    Physical Examination: BP 118/89 mmHg  Pulse 81  Temp(Src) 98.3 F (36.8 C) (Oral)  Resp 18  Ht 6\' 2"  (1.88 m)  Wt 89.54 kg (197 lb 6.4 oz)  BMI 25.33 kg/m2  SpO2 100% Gen: NAD, alert and oriented x 4 HEENT: PEERLA, EOMI, Neck: supple, no JVD or thyromegaly Chest: CTA bilaterally, no wheezes, crackles, or other adventitious sounds CV: RRR, no m/g/c/r Abd: soft, NT, ND, +BS in all four quadrants; no HSM, guarding, ridigity, or rebound tenderness Ext: no edema, well perfused with 2+ pulses, Skin: no rash or lesions noted Lymph: no LAD  Data: Lab Results  Component Value Date   WBC 7.9 12/08/2014   HGB 4.3* 12/08/2014   HCT 13.2* 12/08/2014   MCV 92.0 12/08/2014   PLT 86* 12/08/2014    Recent Labs Lab 12/08/14 1950  HGB 4.3*   Lab Results  Component Value Date   NA 135 12/08/2014   K 5.1 12/08/2014   CL 94* 12/08/2014   CO2 30 12/08/2014   BUN 57* 12/08/2014   CREATININE 6.28* 12/08/2014   No results found for: ALT, AST, GGT, ALKPHOS, BILITOT  Recent Labs Lab 12/09/14 1015  INR 2.08      Assessment/Plan: [name] is a 44 y.o. male with symptomatic anemia and heme positive stools .  Hgb on admission was 4.3, MCV 92, platelets  86 received two unit of blood, currently hgb 5.4 platelets 74.  Significant history of unexplained GI bleed 2 years ago.  Recently started on warfarin 3 months ago for blood clot in Permacath, dose increased last week.  Last dose of warfarin Thursday 3pm.  Dialysis T/T/S, last dialysis Thursday.  PT 23.2,  INR 2.08  Recommendations: Patient has requested a transfer to [place] in [place].  We will continue to follow until his transfer, and if need be will continue evaluation of heme positive stools while he is here.  We agree with continuing to transfuse as needed and holding warfarin. Thank you for the consult. Please call with questions or concerns.  Salvadore Farber, PA-C  I personally performed these services.

## 2014-12-09 NOTE — ED Provider Notes (Signed)
-----------------------------------------   1:00 AM on 12/09/2014 -----------------------------------------  [facility]. Updated patient on status; will discuss with hospitalist for admission to our facility. Checked on patient's blood which is still being processed in the lab secondary to multiple antibodies.  Paulette Blanch, MD 12/09/14 [phone]

## 2014-12-09 NOTE — Progress Notes (Signed)
[facility] [employer] NAME: [name]    MR#:  [id number]  DATE OF BIRTH:  Feb 24, 1970  SUBJECTIVE:  CHIEF COMPLAINT:   Chief Complaint  Patient presents with  . Weakness    Pt. states weakness that started today.  Dialysis patient.   no new complaints, but he is worried about the getting bills from [place] system if he gets more treatment over here. Received 2 unit of blood transfusion hemoglobin is still 5.4.  REVIEW OF SYSTEMS:  CONSTITUTIONAL: No fever, positive for fatigue or weakness.  EYES: No blurred or double vision.  EARS, NOSE, AND THROAT: No tinnitus or ear pain.  RESPIRATORY: No cough, shortness of breath, wheezing or hemoptysis.  CARDIOVASCULAR: No chest pain, orthopnea, edema.  GASTROINTESTINAL: No nausea, vomiting, diarrhea or abdominal pain.  GENITOURINARY: No dysuria, hematuria.  ENDOCRINE: No polyuria, nocturia,  HEMATOLOGY: No anemia, easy bruising or bleeding SKIN: No rash or lesion. MUSCULOSKELETAL: No joint pain or arthritis.   NEUROLOGIC: No tingling, numbness, weakness.  PSYCHIATRY: No anxiety or depression.   ROS  DRUG ALLERGIES:   Allergies  Allergen Reactions  . Ciprofloxacin   . Sulfa Antibiotics     VITALS:  Blood pressure 110/54, pulse 84, temperature 97.5 F (36.4 C), temperature source Oral, resp. rate 16, height 6\' 2"  (1.88 m), weight 89.54 kg (197 lb 6.4 oz), SpO2 100 %.  PHYSICAL EXAMINATION:  GENERAL:  44 y.o.-year-old patient lying in the bed with no acute distress.  EYES: Pupils equal, round, reactive to light and accommodation. No scleral icterus. Extraocular muscles intact.  HEENT: Head atraumatic, normocephalic. Oropharynx and nasopharynx clear. Conjunctiva pale NECK:  Supple, no jugular venous distention. No thyroid enlargement, no tenderness.  LUNGS: Normal breath sounds bilaterally, no wheezing, rales,rhonchi or crepitation. No use of accessory muscles of respiration.   CARDIOVASCULAR: S1, S2 normal. Systolic murmurs, rubs, or gallops.  ABDOMEN: Soft, nontender, nondistended. Bowel sounds present. No organomegaly or mass.  EXTREMITIES: No pedal edema, cyanosis, or clubbing.  NEUROLOGIC: Cranial nerves II through XII are intact. Muscle strength 5/5 in all extremities. Sensation intact. Gait not checked.  PSYCHIATRIC: The patient is alert and oriented x 3.  SKIN: No obvious rash, lesion, or ulcer.   Physical Exam LABORATORY PANEL:   CBC  Recent Labs Lab 12/09/14 1223  WBC 6.2  HGB 5.4*  HCT 16.0*  PLT 76*   ------------------------------------------------------------------------------------------------------------------  Chemistries   Recent Labs Lab 12/09/14 1621  NA 133*  K 6.1*  CL 92*  CO2 30  GLUCOSE 92  BUN 79*  CREATININE 7.98*  CALCIUM 8.4*   ------------------------------------------------------------------------------------------------------------------  Cardiac Enzymes No results for input(s): TROPONINI in the last 168 hours. ------------------------------------------------------------------------------------------------------------------  RADIOLOGY:  No results found.  ASSESSMENT AND PLAN:   Active Problems:   GI bleed  This is a 44 year old African American male with lupus and end-stage renal disease on dialysis admitted for symptomatic anemia likely secondary to GI bleed. 1. GI bleed: Unclear upper or lower.  hemodynamically stable. GI consult ordered  2. Anemia due to blood loss: 2 units of packed red blood cells have been given  Stable hemoglobin is low, we will give 2 more units of transfusion during his hemodialysis today. 3. SLE: Continue Plaquenil 4. End-stage renal disease: Dialysis Tuesday Thursday and Saturday. Nephrology consulted for continuation of dialysis 5. Depression: Continue sertraline 6. DVT prophylaxis: SCDs; hold warfarin 7. GI prophylaxis: Omeprazole per patient's home regimen 8.  Thrombus with his dialysis access  3 months ago    He was started on warfarin for that but currently because of active GI bleed I will hold that and he may resume if safe from GI point of view after he recovers.    All the records are reviewed and case discussed with Care Management/Social Workerr. Management plans discussed with the patient, family and they are in agreement.  CODE STATUS: Full code  TOTAL TIME TAKING CARE OF THIS PATIENT: 35 minutes.     POSSIBLE D/C IN 1-2 DAYS, DEPENDING ON CLINICAL CONDITION.   Vaughan Basta M.D on 12/09/2014   Between 7am to 6pm - Pager - [phone]  After 6pm go to [email or web address] - password [email or web address] [facility]  Office  [phone]  CC: Primary care physician; PROVIDER NOT IN SYSTEM  Note: This dictation was prepared with Dragon dictation along with smaller phrase technology. Any transcriptional errors that result from this process are unintentional.

## 2014-12-09 NOTE — H&P (Signed)
[name] is an 44 y.o. male.   Chief Complaint: Weakness HPI: The patient presents emergency department complaining of generalized weakness. He states that his overall energy has been decreasing 4 days if not weeks. He denies pain anywhere. He denies fevers, nausea, vomiting or diarrhea. In the emergency department the patient is reportedly guaiac positive. Laboratory evaluation revealed hemoglobin of 4 which prompted the emergency department staff to order blood transfusion and called for admission.  Past Medical History  Diagnosis Date  . Lupus ([place])   . Renal disorder   . Renal transplant recipient   . Anemia     Past Surgical History  Procedure Laterality Date  . Nephrectomy transplanted organ      Family History  Problem Relation Age of Onset  . Hypertension Other    Social History:  reports that he has been smoking.  He does not have any smokeless tobacco history on file. He reports that he does not drink alcohol. His drug history is not on file.  Allergies:  Allergies  Allergen Reactions  . Ciprofloxacin   . Sulfa Antibiotics     Prior to Admission medications   Medication Sig Start Date End Date Taking? Authorizing Provider  hydroxychloroquine (PLAQUENIL) 200 MG tablet Take 200 mg by mouth 2 (two) times daily.   Yes Historical Provider, MD  omeprazole (PRILOSEC) 20 MG capsule Take 20 mg by mouth daily.   Yes Historical Provider, MD  PREDNISONE PO Take 15 mg by mouth daily.   Yes Historical Provider, MD  sertraline (ZOLOFT) 100 MG tablet Take 200 mg by mouth at bedtime.   Yes Historical Provider, MD  tamsulosin (FLOMAX) 0.4 MG CAPS capsule Take 0.8 mg by mouth at bedtime.   Yes Historical Provider, MD  warfarin (COUMADIN) 5 MG tablet Take 5 mg by mouth 4 (four) times a week. 2.5 mg the other days   Yes Historical Provider, MD     Results for orders placed or performed during the hospital encounter of 12/08/14 (from the past 48 hour(s))  Basic metabolic panel      Status: Abnormal   Collection Time: 12/08/14  7:50 PM  Result Value Ref Range   Sodium 135 135 - 145 mmol/L   Potassium 5.1 3.5 - 5.1 mmol/L   Chloride 94 (L) 101 - 111 mmol/L   CO2 30 22 - 32 mmol/L   Glucose, Bld 57 (L) 65 - 99 mg/dL   BUN 57 (H) 6 - 20 mg/dL   Creatinine, Ser 6.28 (H) 0.61 - 1.24 mg/dL   Calcium 8.4 (L) 8.9 - 10.3 mg/dL   GFR calc non Af Amer 10 (L) >60 mL/min   GFR calc Af Amer 11 (L) >60 mL/min    Comment: (NOTE) The eGFR has been calculated using the CKD EPI equation. This calculation has not been validated in all clinical situations. eGFR's persistently <60 mL/min signify possible Chronic Kidney Disease.    Anion gap 11 5 - 15  CBC     Status: Abnormal   Collection Time: 12/08/14  7:50 PM  Result Value Ref Range   WBC 7.9 3.8 - 10.6 K/uL   RBC 1.43 (L) 4.40 - 5.90 MIL/uL   Hemoglobin 4.3 (LL) 13.0 - 18.0 g/dL    Comment: CRITICAL RESULT CALLED TO, READ BACK BY AND VERIFIED WITH: MATT MARTIN AT 2103 12/08/2014 BY TFK    HCT 13.2 (LL) 40.0 - 52.0 %    Comment: CRITICAL RESULT CALLED TO, READ BACK BY AND VERIFIED  WITH: MATT MARTIN AT 2103 12/08/2014 BY TFK    MCV 92.0 80.0 - 100.0 fL   MCH 30.1 26.0 - 34.0 pg   MCHC 32.7 32.0 - 36.0 g/dL   RDW 17.7 (H) 11.5 - 14.5 %   Platelets 86 (L) 150 - 440 K/uL  Glucose, capillary     Status: Abnormal   Collection Time: 12/08/14  7:58 PM  Result Value Ref Range   Glucose-Capillary 61 (L) 65 - 99 mg/dL  Type and screen [place]     Status: None (Preliminary result)   Collection Time: 12/08/14  8:35 PM  Result Value Ref Range   ABO/RH(D) O POS    Antibody Screen POS    Sample Expiration 12/11/2014    Antibody Identification PENDING   ABO/Rh     Status: None   Collection Time: 12/08/14  8:35 PM  Result Value Ref Range   ABO/RH(D) O POS    No results found.  Review of Systems  Constitutional: Negative for fever and chills.  HENT: Negative for sore throat and tinnitus.   Eyes:  Negative for blurred vision and redness.  Respiratory: Negative for cough and shortness of breath.   Cardiovascular: Negative for chest pain, palpitations, orthopnea and PND.  Gastrointestinal: Negative for nausea, vomiting, abdominal pain and diarrhea.  Genitourinary: Negative for dysuria, urgency and frequency.  Musculoskeletal: Negative for myalgias and joint pain.  Skin: Negative for rash.       No lesions  Neurological: Positive for weakness. Negative for speech change and focal weakness.  Endo/Heme/Allergies: Does not bruise/bleed easily.       No temperature intolerance  Psychiatric/Behavioral: Negative for depression and suicidal ideas.    Blood pressure 107/58, pulse 88, temperature 99 F (37.2 C), temperature source Oral, resp. rate 10, height $RemoveBe'6\' 2"'[id number]$  (1.88 m), weight 86.183 kg (190 lb), SpO2 98 %. Physical Exam  Nursing note and vitals reviewed. Constitutional: He is oriented to person, place, and time. He appears well-developed and well-nourished. No distress.  HENT:  Head: Normocephalic and atraumatic.  Mouth/Throat: Oropharynx is clear and moist.  Eyes: Conjunctivae and EOM are normal. Pupils are equal, round, and reactive to light. No scleral icterus.  Neck: Normal range of motion. Neck supple. No JVD present. No tracheal deviation present. No thyromegaly present.  Cardiovascular: Normal rate, regular rhythm and normal heart sounds.  Exam reveals no gallop and no friction rub.   No murmur heard. Respiratory: Effort normal and breath sounds normal.  GI: Soft. Bowel sounds are normal. He exhibits no distension. There is no tenderness.  Genitourinary:  Deferred  Musculoskeletal: Normal range of motion. He exhibits no edema.  Lymphadenopathy:    He has no cervical adenopathy.  Neurological: He is oriented to person, place, and time. No cranial nerve deficit.  Groggy/mental slowing  Skin: Skin is warm and dry. No rash noted. No erythema.  Psychiatric: He has a normal mood  and affect. His behavior is normal. Judgment and thought content normal.     Assessment/Plan This is a 44 year old [place] American male with lupus and end-stage renal disease on dialysis admitted for symptomatic anemia likely secondary to GI bleed. 1. GI bleed: Unclear upper or lower. She is quite positive. He is hemodynamically stable. GI consult ordered for the morning. 2. Anemia: 2 units of packed red blood cells have been ordered. 3. SLE: Continue Plaquenil 4. End-stage renal disease: Dialysis Tuesday Thursday and Saturday. Nephrology consulted for continuation of dialysis 5. Depression: Continue sertraline 6.  DVT prophylaxis: SCDs; hold warfarin 7. GI prophylaxis: Omeprazole per patient's home regimen The patient is a full code. Time spent on admission orders and patient care approximately 45 minutes  Harrie Foreman 12/09/2014, 1:40 AM

## 2014-12-09 NOTE — Care Management Note (Signed)
Case Management Note  Patient Details  Name: NATRONE MCQUISTION MRN: [id number] Date of Birth: 23-Mar-1970  Subjective/Objective:     Discussed transfer to [facility] if a bed is available with Mr Derman earlier today. He signed a consent for transfer to the [place]. All paperwork faxed to the [place] in [place] at [phone]. Continue to await a response from the [place] as to whether Mr Peden has been accepted for admission by the [place].                Action/Plan:   Expected Discharge Date:                  Expected Discharge Plan:     In-House Referral:     Discharge planning Services     Post Acute Care Choice:    Choice offered to:     DME Arranged:    DME Agency:     HH Arranged:    [place] Agency:     Status of Service:     Medicare Important Message Given:    Date Medicare IM Given:    Medicare IM give by:    Date Additional Medicare IM Given:    Additional Medicare Important Message give by:     If discussed at [place] of Stay Meetings, dates discussed:    Additional Comments:  Mychelle Kendra A, RN 12/09/2014, 3:45 PM

## 2014-12-09 NOTE — Consult Note (Signed)
[facility] CONSULT NOTE    Date: 12/09/2014                  Patient Name:  [name]  MRN: [id number]  DOB: 1970-07-18  Age / Sex: 44 y.o., male         PCP: PROVIDER NOT IN SYSTEM                 Service Requesting Consult: Dr. Marcille Blanco                 Reason for Consult: Management of ESRD.            History of Present Illness: Patient is a 44 y.o. male with a PMHx of ESRD on HD TTHS, anemia of CKD, secondary of hyperparathyroidism, systemic lupus erythematosus, history of failed renal transplant, left upper extremity AV fistula, on chronic anticoagulation who was admitted to [facility] on 12/08/2014 for evaluation of weakness.  He was found to have a very low hemoglobin of 4.3 upon admission.  As above he is on warfarin.  He developed weakness as a result of anemia.  He is unsure as to whether he's had melena but denies vomiting of blood.  Pt has been on chronic dialysis for quite some time.  He has history of failed renal transplant.  He normally dialyzes in [place] on [address].  He has history of secondary hyperparathyroidism for which he takes phoslo.     Medications: Outpatient medications: Prescriptions prior to admission  Medication Sig Dispense Refill Last Dose  . hydroxychloroquine (PLAQUENIL) 200 MG tablet Take 200 mg by mouth 2 (two) times daily.   unknown at unknown  . omeprazole (PRILOSEC) 20 MG capsule Take 20 mg by mouth daily.   unknown at unknown  . PREDNISONE PO Take 15 mg by mouth daily.     . sertraline (ZOLOFT) 100 MG tablet Take 200 mg by mouth at bedtime.   unknown at unknown  . tamsulosin (FLOMAX) 0.4 MG CAPS capsule Take 0.8 mg by mouth at bedtime.   unknown at unknown  . warfarin (COUMADIN) 5 MG tablet Take 5 mg by mouth 4 (four) times a week. 2.5 mg the other days   12/08/2014 at Unknown time    Current medications: Current Facility-Administered Medications  Medication Dose Route Frequency Provider Last Rate Last Dose   . acetaminophen (TYLENOL) tablet 650 mg  650 mg Oral Q6H PRN Harrie Foreman, MD       Or  . acetaminophen (TYLENOL) suppository 650 mg  650 mg Rectal Q6H PRN Harrie Foreman, MD      . docusate sodium (COLACE) capsule 100 mg  100 mg Oral BID Harrie Foreman, MD   100 mg at 12/09/14 0259  . morphine 2 MG/ML injection 1 mg  1 mg Intravenous Q4H PRN Harrie Foreman, MD   1 mg at 12/09/14 [id number]  . ondansetron (ZOFRAN) tablet 4 mg  4 mg Oral Q6H PRN Harrie Foreman, MD       Or  . ondansetron [facility]) injection 4 mg  4 mg Intravenous Q6H PRN Harrie Foreman, MD      . sodium chloride 0.9 % injection 3 mL  3 mL Intravenous Q12H Harrie Foreman, MD   3 mL at 12/09/14 [id number]      Allergies: Allergies  Allergen Reactions  . Ciprofloxacin   . Sulfa Antibiotics       Past Medical History: Past Medical History  Diagnosis Date  . Lupus ([place])   . Renal disorder   . Renal transplant recipient   . Anemia      Past Surgical History: Past Surgical History  Procedure Laterality Date  . Nephrectomy transplanted organ       Family History: Family History  Problem Relation Age of Onset  . Hypertension Other      Social History: Social History   Social History  . Marital Status: Divorced    Spouse Name: N/A  . Number of Children: N/A  . Years of Education: N/A   Occupational History  . Not on file.   Social History Main Topics  . Smoking status: Current Every Day Smoker -- 0.50 packs/day  . Smokeless tobacco: Not on file  . Alcohol Use: No  . Drug Use: Not on file  . Sexual Activity: Not on file   Other Topics Concern  . Not on file   Social History Narrative     Review of Systems: Review of Systems  Constitutional: Positive for malaise/fatigue. Negative for fever, chills, weight loss and diaphoresis.  HENT: Negative for ear pain and nosebleeds.   Eyes: Negative for blurred vision and double vision.  Respiratory: Negative for cough, hemoptysis and  sputum production.   Cardiovascular: Negative for chest pain, palpitations and orthopnea.  Gastrointestinal: Negative for heartburn, nausea, vomiting, abdominal pain and constipation.  Genitourinary: Positive for urgency. Negative for dysuria.  Musculoskeletal: Negative for myalgias and back pain.  Skin: Negative for rash.  Neurological: Positive for weakness. Negative for dizziness, speech change, focal weakness and headaches.  Endo/Heme/Allergies: Does not bruise/bleed easily.  Psychiatric/Behavioral: Positive for depression. The patient is not nervous/anxious.      Vital Signs: Blood pressure 98/45, pulse 91, temperature 98.4 F (36.9 C), temperature source Oral, resp. rate 17, height 6\' 2"  (1.88 m), weight 89.54 kg (197 lb 6.4 oz), SpO2 98 %.  Weight trends: Filed Weights   12/08/14 1924 12/09/14 0227  Weight: 86.183 kg (190 lb) 89.54 kg (197 lb 6.4 oz)    Physical Exam: General: NAD, laying bed  Head: Normocephalic, atraumatic.  Eyes: Anicteric, EOMI, pallor noted  Nose: Mucous membranes moist, not inflammed, nonerythematous.  Throat: Oropharynx nonerythematous, no exudate appreciated.   Neck: No deformities, masses, or tenderness noted.Supple, No carotid Bruits, no JVD.  Lungs:  Normal respiratory effort. Clear to auscultation BL without crackles or wheezes.  Heart: RRR. S1 and S2 normal without gallop, murmur, or rubs.  Abdomen:  BS normoactive. Soft, Nondistended, non-tender.  No masses or organomegaly.  Extremities: No pretibial edema.  Neurologic: A&O X3, Motor strength is 5/5 in the all 4 extremities  Skin: No visible rashes, scars.    Lab results: Basic Metabolic Panel:  Recent Labs Lab 12/08/14 1950  NA 135  K 5.1  CL 94*  CO2 30  GLUCOSE 57*  BUN 57*  CREATININE 6.28*  CALCIUM 8.4*    Liver Function Tests: No results for input(s): AST, ALT, ALKPHOS, BILITOT, PROT, ALBUMIN in the last 168 hours. No results for input(s): LIPASE, AMYLASE in the last  168 hours. No results for input(s): AMMONIA in the last 168 hours.  CBC:  Recent Labs Lab 12/08/14 1950  WBC 7.9  HGB 4.3*  HCT 13.2*  MCV 92.0  PLT 86*    Cardiac Enzymes: No results for input(s): CKTOTAL, CKMB, CKMBINDEX, TROPONINI in the last 168 hours.  BNP: Invalid input(s): POCBNP  CBG:  Recent Labs Lab 12/08/14 1958 12/09/14 0204  GLUCAP 61* 92  Microbiology: No results found for this or any previous visit.  Coagulation Studies:  Recent Labs  12/09/14 1015  LABPROT 23.2*  INR 2.08    Urinalysis: No results for input(s): COLORURINE, LABSPEC, PHURINE, GLUCOSEU, HGBUR, BILIRUBINUR, KETONESUR, PROTEINUR, UROBILINOGEN, NITRITE, LEUKOCYTESUR in the last 72 hours.  Invalid input(s): APPERANCEUR    Imaging:  No results found.   Assessment & Plan: Pt is a 44 y.o. male with a PMHx of ESRD on HD TTHS followed by [facility] in [place], anemia of CKD, secondary of hyperparathyroidism, systemic lupus erythematosus, history of failed renal transplant, left upper extremity AV fistula, on chronic anticoagulation who was admitted to [facility] on 12/08/2014 for evaluation of weakness.  1.  ESRD on HD TTHS:  We will plan for HD today but will not perform ultrafiltration at this point in time given blood loss.  We will use his left upper extremity AVF.  Thereafter next HD on Tuesday or sooner if indicated.  2.  Anemia of CKD/Anemia blood loss:  Pt with GI bleed, unclear if upper vs lower, awaiting GI input.  Currently receiving blood transfusion.  Agree with stopping coumadin.  Would consider adding PPI, but defer this to hospitalist/GI.   3.  SHPTH:  Check ipth/phos with HD today.  4.  Systemic lupus erythematosus:  Patient was on plaquenil at home, currently off of this.

## 2014-12-10 LAB — CBC
HCT: 18.2 % — ABNORMAL LOW (ref 40.0–52.0)
HEMATOCRIT: 23.8 % — AB (ref 40.0–52.0)
HEMOGLOBIN: 6 g/dL — AB (ref 13.0–18.0)
HEMOGLOBIN: 7.7 g/dL — AB (ref 13.0–18.0)
MCH: 29.1 pg (ref 26.0–34.0)
MCH: 28.6 pg (ref 26.0–34.0)
MCHC: 33.1 g/dL (ref 32.0–36.0)
MCHC: 32.5 g/dL (ref 32.0–36.0)
MCV: 87.8 fL (ref 80.0–100.0)
MCV: 88 fL (ref 80.0–100.0)
PLATELETS: 68 10*3/uL — AB (ref 150–440)
Platelets: 75 10*3/uL — ABNORMAL LOW (ref 150–440)
RBC: 2.08 MIL/uL — AB (ref 4.40–5.90)
RBC: 2.7 MIL/uL — AB (ref 4.40–5.90)
RDW: 19 % — ABNORMAL HIGH (ref 11.5–14.5)
RDW: 18.9 % — ABNORMAL HIGH (ref 11.5–14.5)
WBC: 5.8 10*3/uL (ref 3.8–10.6)
WBC: 6 10*3/uL (ref 3.8–10.6)

## 2014-12-10 LAB — BASIC METABOLIC PANEL
ANION GAP: 7 (ref 5–15)
BUN: 50 mg/dL — ABNORMAL HIGH (ref 6–20)
CALCIUM: 8.4 mg/dL — AB (ref 8.9–10.3)
CO2: 30 mmol/L (ref 22–32)
Chloride: 98 mmol/L — ABNORMAL LOW (ref 101–111)
Creatinine, Ser: 5.95 mg/dL — ABNORMAL HIGH (ref 0.61–1.24)
GFR, EST AFRICAN AMERICAN: 12 mL/min — AB (ref >60)
GFR, EST NON AFRICAN AMERICAN: 10 mL/min — AB (ref >60)
GLUCOSE: 81 mg/dL (ref 65–99)
POTASSIUM: 5 mmol/L (ref 3.5–5.1)
Sodium: 135 mmol/L (ref 135–145)

## 2014-12-10 LAB — HEPATITIS C ANTIBODY: HCV Ab: <0.1 s/co ratio (ref 0.0–0.9)

## 2014-12-10 LAB — PREPARE RBC (CROSSMATCH)

## 2014-12-10 LAB — HIV ANTIBODY (ROUTINE TESTING W REFLEX): HIV Screen 4th Generation wRfx: NONREACTIVE

## 2014-12-10 LAB — ABO/RH: ABO/RH(D): O POS

## 2014-12-10 LAB — HEPATITIS B SURFACE ANTIGEN: HEP B S AG: NEGATIVE

## 2014-12-10 MED ORDER — PANTOPRAZOLE SODIUM 40 MG PO TBEC: 40.0000 mg | DELAYED_RELEASE_TABLET | Freq: Two times a day (BID) | ORAL | Status: DC

## 2014-12-10 MED ORDER — MORPHINE SULFATE (PF) 4 MG/ML IV SOLN
4.0000 mg | INTRAVENOUS | Status: DC | PRN
  Administered 2014-12-10: 4 mg via INTRAVENOUS
  Filled 2014-12-10: qty 1

## 2014-12-10 MED ORDER — SODIUM CHLORIDE 0.9 % IV SOLN
Freq: Once | INTRAVENOUS | Status: AC
  Administered 2014-12-10: 09:00:00 via INTRAVENOUS

## 2014-12-10 MED ORDER — FERROUS SULFATE 324 (65 FE) MG PO TBEC: 1.0000 | DELAYED_RELEASE_TABLET | Freq: Two times a day (BID) | ORAL | Status: AC

## 2014-12-10 NOTE — Care Management Note (Signed)
Case Management Note  Patient Details  Name: [name] MRN: [id number] Date of Birth: 1970/12/22  Subjective/Objective:  Refaxed bed request to weekend fax number [phone] and called the [facility] operator after being unable to get an answer when calling the AOD. The [facility] operator was unable to get the AOD to answer his/her phone. Also faxed bed request to Rosanne Sack at [phone] so that she will see it on Monday if no one reviews the bed request this weekend.                   Action/Plan:   Expected Discharge Date:                  Expected Discharge Plan:     In-House Referral:     Discharge planning Services     Post Acute Care Choice:    Choice offered to:     DME Arranged:    DME Agency:     HH Arranged:    [place] Agency:     Status of Service:     Medicare Important Message Given:    Date Medicare IM Given:    Medicare IM give by:    Date Additional Medicare IM Given:    Additional Medicare Important Message give by:     If discussed at [place] of Stay Meetings, dates discussed:    Additional Comments:  [name] A, RN 12/10/2014, 10:19 AM

## 2014-12-10 NOTE — Consult Note (Signed)
Pt has not had any further bleeding, no new abd pain, no vomiting.  He is requesting he be started on his usual pain medicines that he takes for musculoskeletal problems associated with lupus. I told him I would pass  this along to his Hospitalist.  Hgb 6 after recent transfusions, plt 68K, WBC 5.3.  No new suggestions.  Hep C and HIV both neg.  Await transfer to [facility].

## 2014-12-10 NOTE — Progress Notes (Signed)
[facility]  ROUNDING NOTE   Subjective:  Pt seen at bedside. Hgb up to 6.0 this AM.  Receiving another unit of blood. Currently pending transfer to [place] in [place]. No immediate plans for endoscopy.  Objective:  Vital signs in last 24 hours:  Temp:  [97.5 F (36.4 C)-99.2 F (37.3 C)] 98.1 F (36.7 C) (11/20 1025) Pulse Rate:  [81-94] 81 (11/20 1025) Resp:  [13-20] 20 (11/20 1025) BP: (109-135)/(54-85) 123/62 mmHg (11/20 1025) SpO2:  [99 %-100 %] 100 % (11/20 1025) Weight:  [92.987 kg (205 lb)] 92.987 kg (205 lb) (11/20 0201)  Weight change: 6.804 kg (15 lb) Filed Weights   12/08/14 1924 12/09/14 0227 12/10/14 0201  Weight: 86.183 kg (190 lb) 89.54 kg (197 lb 6.4 oz) 92.987 kg (205 lb)    Intake/Output: I/O last 3 completed shifts: In: 1432.1 [P.O.:480; I.V.:3; Blood:949.1] Out: 690 [Other:690]   Intake/Output this shift:  Total I/O In: 699.7 [P.O.:480; Blood:219.7] Out: 0   Physical Exam: General: NAD, resting in bed  Head: Normocephalic, atraumatic. Moist oral mucosal membranes  Eyes: Pallor noted.  Neck: Supple, trachea midline  Lungs:  Clear to auscultation normal effort  Heart: S1S2 no rubs  Abdomen:  Soft, nontender, BS present   Extremities: trace peripheral edema.  Neurologic: Nonfocal, moving all four extremities  Skin: No lesions       Basic Metabolic Panel:  Recent Labs Lab 12/08/14 1950 12/09/14 1621  NA 135 133*  K 5.1 6.1*  CL 94* 92*  CO2 30 30  GLUCOSE 57* 92  BUN 57* 79*  CREATININE 6.28* 7.98*  CALCIUM 8.4* 8.4*  PHOS  --  8.3*    Liver Function Tests:  Recent Labs Lab 12/09/14 1621  ALBUMIN 2.5*   No results for input(s): LIPASE, AMYLASE in the last 168 hours. No results for input(s): AMMONIA in the last 168 hours.  CBC:  Recent Labs Lab 12/08/14 1950 12/09/14 1223 12/10/14 0451  WBC 7.9 6.2 5.8  HGB 4.3* 5.4* 6.0*  HCT 13.2* 16.0* 18.2*  MCV 92.0 92.0 87.8  PLT 86* 76* 68*    Cardiac  Enzymes: No results for input(s): CKTOTAL, CKMB, CKMBINDEX, TROPONINI in the last 168 hours.  BNP: Invalid input(s): POCBNP  CBG:  Recent Labs Lab 12/08/14 1958 12/09/14 0204  GLUCAP 61* 92    Microbiology: No results found for this or any previous visit.  Coagulation Studies:  Recent Labs  12/09/14 1015  LABPROT 23.2*  INR 2.08    Urinalysis: No results for input(s): COLORURINE, LABSPEC, PHURINE, GLUCOSEU, HGBUR, BILIRUBINUR, KETONESUR, PROTEINUR, UROBILINOGEN, NITRITE, LEUKOCYTESUR in the last 72 hours.  Invalid input(s): APPERANCEUR    Imaging: No results found.   Medications:     . sodium chloride   Intravenous Once  . docusate sodium  100 mg Oral BID  . heparin  1,000 Units Intracatheter Once  . sodium chloride  3 mL Intravenous Q12H   sodium chloride, sodium chloride, acetaminophen **OR** acetaminophen, alteplase, diphenhydrAMINE, heparin, lidocaine (PF), lidocaine-prilocaine, morphine injection, ondansetron **OR** ondansetron (ZOFRAN) IV, pentafluoroprop-tetrafluoroeth  Assessment/ Plan:  44 y.o. male with a PMHx of ESRD on HD TTHS followed by [facility] in [place], anemia of CKD, secondary of hyperparathyroidism, systemic lupus erythematosus, history of failed renal transplant, left upper extremity AV fistula, on chronic anticoagulation who was admitted to [facility] on 12/08/2014 for evaluation of weakness.  1. ESRD on HD TTHS: Pt had HD yesterday, no acute indication for HD today, will plan for HD again on  Tuesday if still here, pt pending transfer to the [facility].  2. Anemia of CKD/Anemia blood loss: Pt with GI bleed, unclear if upper vs lower, was on coumadin at home.  -receiving blood this AM, has had prior history of endoscopies.  Doesn't appear to be actively bleeding now.  Awaiting transfer to [facility].  3. SHPTH: phos high at 8.3, was on phoslo at home, binders currently on hold.   4. Systemic lupus erythematosus: pt on plaquenil  at home for this issue.   LOS: 1 Shenia Alan 11/20/20161:26 PM

## 2014-12-10 NOTE — Discharge Summary (Signed)
[facility] [place] NAME: [name]    MR#:  [id number]  DATE OF BIRTH:  March 17, 1970  DATE OF ADMISSION:  12/08/2014 ADMITTING PHYSICIAN: Harrie Foreman, MD  DATE OF DISCHARGE: 12/10/2014  PRIMARY CARE PHYSICIAN: PROVIDER NOT IN SYSTEM    ADMISSION DIAGNOSIS:  Symptomatic anemia [D64.9] Gastrointestinal hemorrhage, unspecified gastritis, unspecified gastrointestinal hemorrhage type [K92.2]  DISCHARGE DIAGNOSIS:  Active Problems:   GI bleed   SECONDARY DIAGNOSIS:   Past Medical History  Diagnosis Date  . Lupus ([place])   . Renal disorder   . Renal transplant recipient   . Anemia     HOSPITAL COURSE:   This is a 44 year old African American male with lupus and end-stage renal disease on dialysis admitted for symptomatic anemia likely secondary to GI bleed. 1. GI bleed: Unclear upper or lower. hemodynamically stable. GI consult appreciated.  He had received multiple transfusions in hospital, hemoglobin came up to 6 PM the morning 12-10-14, given 2 more unit transfusions.  As by GI physician there was no plan of immediate endoscopy or colonoscopy.  The patient's age he had multiple workup including capsule endoscopy done in the past by [facility] doctors and nothing was found.  As patient was feeling fine and he wanted to go home and he agreed to follow with the [facility] in GI doctors as soon as possible most likely next 1 or 2 days.  I discussed with her GI physician Dr. Vira Agar about patient's request and he agreed with the plan to discharge him home.  Patient was on Coumadin before admission and he stopped that because of bleeding and advised not to restart until he sees GI doctors in the office.  2. Anemia due to blood loss: 24 units of blood transfusion received on 19th of November 2016 ,  No active bleeding, but hemoglobin came up to 6.  There is some component of fluid retention in him also because of being on  dialysis.  Give 2 more units of transfusion on 12-10-14.  Patient admitted to follow tomorrow to [facility]. 3. SLE: Continue Plaquenil 4. End-stage renal disease: Dialysis Tuesday Thursday and Saturday. Nephrology consulted for continuation of dialysis 5. Depression: Continue sertraline 6. DVT prophylaxis: SCDs; hold warfarin 7. GI prophylaxis: Omeprazole per patient's home regimen- giving PPI twice a day on discharge. 8. Thrombus with his dialysis access 3 months ago  He was started on warfarin for that but currently because of active GI bleed I will hold that and he may resume if safe from GI point of view after he recovers.  DISCHARGE CONDITIONS:   Stable.  CONSULTS OBTAINED:  Treatment Team:  Manya Silvas, MD Anthonette Legato, MD  DRUG ALLERGIES:   Allergies  Allergen Reactions  . Ciprofloxacin   . Sulfa Antibiotics     DISCHARGE MEDICATIONS:   Current Discharge Medication List    START taking these medications   Details  ferrous sulfate 324 (65 FE) MG TBEC Take 1 tablet (325 mg total) by mouth 2 (two) times daily. Qty: 30 tablet, Refills: 0    pantoprazole (PROTONIX) 40 MG tablet Take 1 tablet (40 mg total) by mouth 2 (two) times daily. Qty: 60 tablet, Refills: 0      CONTINUE these medications which have NOT CHANGED   Details  hydroxychloroquine (PLAQUENIL) 200 MG tablet Take 200 mg by mouth 2 (two) times daily.    PREDNISONE PO Take 15 mg by mouth daily.  sertraline (ZOLOFT) 100 MG tablet Take 200 mg by mouth at bedtime.    tamsulosin (FLOMAX) 0.4 MG CAPS capsule Take 0.8 mg by mouth at bedtime.      STOP taking these medications     omeprazole (PRILOSEC) 20 MG capsule      warfarin (COUMADIN) 5 MG tablet          DISCHARGE INSTRUCTIONS:    Follow with GI clinic at [facility] as soon as possible.  If you experience worsening of your admission symptoms, develop shortness of breath, life threatening emergency, suicidal or homicidal thoughts you  must seek medical attention immediately by calling 911 or calling your MD immediately  if symptoms less severe.  You Must read complete instructions/literature along with all the possible adverse reactions/side effects for all the Medicines you take and that have been prescribed to you. Take any new Medicines after you have completely understood and accept all the possible adverse reactions/side effects.   Please note  You were cared for by a hospitalist during your hospital stay. If you have any questions about your discharge medications or the care you received while you were in the hospital after you are discharged, you can call the unit and asked to speak with the hospitalist on call if the hospitalist that took care of you is not available. Once you are discharged, your primary care physician will handle any further medical issues. Please note that NO REFILLS for any discharge medications will be authorized once you are discharged, as it is imperative that you return to your primary care physician (or establish a relationship with a primary care physician if you do not have one) for your aftercare needs so that they can reassess your need for medications and monitor your lab values.    Today   CHIEF COMPLAINT:   Chief Complaint  Patient presents with  . Weakness    Pt. states weakness that started today.  Dialysis patient.    HISTORY OF PRESENT ILLNESS:  [name]  is a 44 y.o. male   VITAL SIGNS:  Blood pressure 114/58, pulse 85, temperature 97.7 F (36.5 C), temperature source Oral, resp. rate 18, height 6\' 2"  (1.88 m), weight 92.987 kg (205 lb), SpO2 100 %.  I/O:   Intake/Output Summary (Last 24 hours) at 12/10/14 1510 Last data filed at 12/10/14 1300  Gross per 24 hour  Intake 1277.7 ml  Output    690 ml  Net  587.7 ml    PHYSICAL EXAMINATION:   GENERAL: 44 y.o.-year-old patient lying in the bed with no acute distress.  EYES: Pupils equal, round, reactive to  light and accommodation. No scleral icterus. Extraocular muscles intact.  HEENT: Head atraumatic, normocephalic. Oropharynx and nasopharynx clear. Conjunctiva pale NECK: Supple, no jugular venous distention. No thyroid enlargement, no tenderness.  LUNGS: Normal breath sounds bilaterally, no wheezing, rales,rhonchi or crepitation. No use of accessory muscles of respiration.  CARDIOVASCULAR: S1, S2 normal. Systolic murmurs, rubs, or gallops.  ABDOMEN: Soft, nontender, nondistended. Bowel sounds present. No organomegaly or mass.  EXTREMITIES: No pedal edema, cyanosis, or clubbing.  NEUROLOGIC: Cranial nerves II through XII are intact. Muscle strength 5/5 in all extremities. Sensation intact. Gait not checked.  PSYCHIATRIC: The patient is alert and oriented x 3.  SKIN: No obvious rash, lesion, or ulcer.    DATA REVIEW:   CBC  Recent Labs Lab 12/10/14 0451  WBC 5.8  HGB 6.0*  HCT 18.2*  PLT 68*    Chemistries  Recent Labs Lab 12/09/14 1621  NA 133*  K 6.1*  CL 92*  CO2 30  GLUCOSE 92  BUN 79*  CREATININE 7.98*  CALCIUM 8.4*    Cardiac Enzymes No results for input(s): TROPONINI in the last 168 hours.  Microbiology Results  No results found for this or any previous visit.  RADIOLOGY:  No results found.  EKG:   Orders placed or performed during the hospital encounter of 12/08/14  . EKG 12-Lead  . EKG 12-Lead  . ED EKG  . ED EKG      Management plans discussed with the patient, family and they are in agreement.  CODE STATUS:     Code Status Orders        Start     Ordered   12/09/14 0226  Full code   Continuous     12/09/14 0225      TOTAL TIME TAKING CARE OF THIS PATIENT: **35* minutes.    Vaughan Basta M.D on 12/10/2014 at 3:10 PM  Between 7am to 6pm - Pager - [phone]  After 6pm go to [email or web address] - password [email or web address]  Office  [phone]  CC: Primary care physician; PROVIDER NOT IN  SYSTEM   Note: This dictation was prepared with Dragon dictation along with smaller phrase technology. Any transcriptional errors that result from this process are unintentional.

## 2014-12-11 LAB — TYPE AND SCREEN
ABO/RH(D): O POS
Antibody Screen: POSITIVE
DAT, IGG: POSITIVE
DAT, complement: POSITIVE
UNIT DIVISION: 0
UNIT DIVISION: 0
UNIT DIVISION: 0
Unit division: 0
Unit division: 0
Unit division: 0

## 2015-09-08 DIAGNOSIS — Z9189 Other specified personal risk factors, not elsewhere classified: Secondary | ICD-10-CM | POA: Insufficient documentation

## 2015-09-14 DIAGNOSIS — R768 Other specified abnormal immunological findings in serum: Secondary | ICD-10-CM | POA: Insufficient documentation

## 2015-10-23 ENCOUNTER — Emergency Department: Payer: Non-veteran care

## 2015-10-23 ENCOUNTER — Emergency Department
Admission: EM | Admit: 2015-10-23 | Discharge: 2015-10-23 | Disposition: A | Discharge status: Home / Self Care | Payer: Non-veteran care | Attending: Emergency Medicine | Admitting: Emergency Medicine

## 2015-10-23 DIAGNOSIS — R1011 Right upper quadrant pain: Secondary | ICD-10-CM | POA: Insufficient documentation

## 2015-10-23 DIAGNOSIS — I120 Hypertensive chronic kidney disease with stage 5 chronic kidney disease or end stage renal disease: Secondary | ICD-10-CM | POA: Insufficient documentation

## 2015-10-23 DIAGNOSIS — R1032 Left lower quadrant pain: Secondary | ICD-10-CM | POA: Insufficient documentation

## 2015-10-23 DIAGNOSIS — Z7952 Long term (current) use of systemic steroids: Secondary | ICD-10-CM | POA: Diagnosis not present

## 2015-10-23 DIAGNOSIS — Z87891 Personal history of nicotine dependence: Secondary | ICD-10-CM | POA: Diagnosis not present

## 2015-10-23 DIAGNOSIS — Z992 Dependence on renal dialysis: Secondary | ICD-10-CM | POA: Insufficient documentation

## 2015-10-23 DIAGNOSIS — E875 Hyperkalemia: Secondary | ICD-10-CM | POA: Diagnosis not present

## 2015-10-23 DIAGNOSIS — N186 End stage renal disease: Secondary | ICD-10-CM | POA: Diagnosis not present

## 2015-10-23 DIAGNOSIS — Z79899 Other long term (current) drug therapy: Secondary | ICD-10-CM | POA: Diagnosis not present

## 2015-10-23 DIAGNOSIS — R109 Unspecified abdominal pain: Secondary | ICD-10-CM

## 2015-10-23 HISTORY — DX: Systemic involvement of connective tissue, unspecified: M35.9

## 2015-10-23 HISTORY — DX: Disorder of kidney and ureter, unspecified: N28.9

## 2015-10-23 LAB — CBC
HCT: 24.1 % — ABNORMAL LOW (ref 40.0–52.0)
Hemoglobin: 7.9 g/dL — ABNORMAL LOW (ref 13.0–18.0)
MCH: 29.3 pg (ref 26.0–34.0)
MCHC: 32.7 g/dL (ref 32.0–36.0)
MCV: 89.7 fL (ref 80.0–100.0)
PLATELETS: 129 10*3/uL — AB (ref 150–440)
RBC: 2.69 MIL/uL — AB (ref 4.40–5.90)
RDW: 17.4 % — ABNORMAL HIGH (ref 11.5–14.5)
WBC: 9.9 10*3/uL (ref 3.8–10.6)

## 2015-10-23 LAB — DIFFERENTIAL
Basophils Absolute: 0.1 10*3/uL (ref 0–0.1)
Basophils Relative: 1 %
EOS ABS: 0.3 10*3/uL (ref 0–0.7)
EOS PCT: 3 %
Lymphocytes Relative: 8 %
Lymphs Abs: 0.8 10*3/uL — ABNORMAL LOW (ref 1.0–3.6)
Monocytes Absolute: 0.9 10*3/uL (ref 0.2–1.0)
Monocytes Relative: 9 %
NEUTROS PCT: 79 %
Neutro Abs: 8.2 10*3/uL — ABNORMAL HIGH (ref 1.4–6.5)

## 2015-10-23 LAB — LIPASE, BLOOD: Lipase: 30 U/L (ref 11–51)

## 2015-10-23 LAB — COMPREHENSIVE METABOLIC PANEL
ALK PHOS: 159 U/L — AB (ref 38–126)
ALT: 58 U/L (ref 17–63)
AST: 99 U/L — ABNORMAL HIGH (ref 15–41)
Albumin: 3 g/dL — ABNORMAL LOW (ref 3.5–5.0)
Anion gap: 10 (ref 5–15)
BILIRUBIN TOTAL: 1.2 mg/dL (ref 0.3–1.2)
BUN: 81 mg/dL — ABNORMAL HIGH (ref 6–20)
CALCIUM: 7.5 mg/dL — AB (ref 8.9–10.3)
CO2: 24 mmol/L (ref 22–32)
CREATININE: 8.72 mg/dL — AB (ref 0.61–1.24)
Chloride: 101 mmol/L (ref 101–111)
GFR calc non Af Amer: 6 mL/min — ABNORMAL LOW (ref >60)
GFR, EST AFRICAN AMERICAN: 7 mL/min — AB (ref >60)
GLUCOSE: 90 mg/dL (ref 65–99)
Potassium: 6.3 mmol/L (ref 3.5–5.1)
SODIUM: 135 mmol/L (ref 135–145)
TOTAL PROTEIN: 6.8 g/dL (ref 6.5–8.1)

## 2015-10-23 MED ORDER — IOPAMIDOL (ISOVUE-300) INJECTION 61%
30.0000 mL | Freq: Once | INTRAVENOUS | Status: AC
  Administered 2015-10-23: 30 mL via ORAL

## 2015-10-23 MED ORDER — ONDANSETRON HCL 4 MG/2ML IJ SOLN
4.0000 mg | Freq: Once | INTRAMUSCULAR | Status: AC
  Administered 2015-10-23: 4 mg via INTRAVENOUS
  Filled 2015-10-23: qty 2

## 2015-10-23 MED ORDER — MORPHINE SULFATE (PF) 4 MG/ML IV SOLN
4.0000 mg | Freq: Once | INTRAVENOUS | Status: AC
  Administered 2015-10-23: 4 mg via INTRAVENOUS
  Filled 2015-10-23: qty 1

## 2015-10-23 MED ORDER — ALBUTEROL SULFATE (2.5 MG/3ML) 0.083% IN NEBU
INHALATION_SOLUTION | RESPIRATORY_TRACT | Status: AC
  Administered 2015-10-23: 20:00:00
  Filled 2015-10-23: qty 3

## 2015-10-23 MED ORDER — SODIUM BICARBONATE 8.4 % IV SOLN
50.0000 meq | Freq: Once | INTRAVENOUS | Status: AC
  Administered 2015-10-23: 50 meq via INTRAVENOUS
  Filled 2015-10-23: qty 50

## 2015-10-23 MED ORDER — IOPAMIDOL (ISOVUE-300) INJECTION 61%
100.0000 mL | Freq: Once | INTRAVENOUS | Status: AC | PRN
  Administered 2015-10-23: 100 mL via INTRAVENOUS

## 2015-10-23 MED ORDER — ALBUTEROL SULFATE (2.5 MG/3ML) 0.083% IN NEBU
2.5000 mg | INHALATION_SOLUTION | Freq: Once | RESPIRATORY_TRACT | Status: AC
  Administered 2015-10-23: 2.5 mg via RESPIRATORY_TRACT

## 2015-10-23 MED ORDER — ALBUTEROL SULFATE (2.5 MG/3ML) 0.083% IN NEBU
2.5000 mg | INHALATION_SOLUTION | Freq: Once | RESPIRATORY_TRACT | Status: AC
  Administered 2015-10-23: 2.5 mg via RESPIRATORY_TRACT
  Filled 2015-10-23: qty 3

## 2015-10-23 MED ORDER — ALBUTEROL SULFATE (2.5 MG/3ML) 0.083% IN NEBU
INHALATION_SOLUTION | RESPIRATORY_TRACT | Status: AC
  Administered 2015-10-23: 2.5 mg via RESPIRATORY_TRACT
  Filled 2015-10-23: qty 3

## 2015-10-23 NOTE — ED Notes (Signed)

## 2015-10-23 NOTE — ED Triage Notes (Signed)
Pt presents to ED via [facility] from home for abdominal pain that began this AM. Pt states he has all organs. Fire department told EMS that BP was low, but EMS stated BP was WNL. Pt on 4 L nasal cannula all the time, dialysis pt, fistula on L side. Pt states some nausea and loose stools but denies fevers and vomiting. Alert and oriented, groaning occasionally.

## 2015-10-23 NOTE — ED Notes (Signed)
Pt asking for water per Kyra Manges. Dr. Cinda Quest states to wait til CT results come back, Kyra Manges updated the patient.

## 2015-10-23 NOTE — ED Provider Notes (Addendum)
[facility] Emergency Department Provider Note   ____________________________________________   First MD Initiated Contact with Patient 10/23/15 1442     (approximate)  I have reviewed the triage vital signs and the nursing notes.   HISTORY  Chief Complaint Abdominal Pain   HPI [name] is a 45 y.o. male who reports he ate a big meal yesterday and woke up this morning with abdominal pain in fact aching all over. He is laying in the room moaning. He says his blood pressure is usually 0:08 systolic and is now 96 systolic. The pain seems to be worse in the left lower quadrant and the right upper quadrant. Pain is at least moderate if not severe. Achy patient is a dialysis patient pain was bad enough today that he missed dialysis. No nausea vomiting or diarrhea at this point. He does have some soft stools. 2 today. Patient reports his kidney graft is no longer working.  Past Medical History:  Diagnosis Date  . Anemia   . Collagen vascular disease ([place])   . Hypertension   . Lupus   . Renal disorder   . Renal insufficiency   . Renal transplant recipient     Patient Active Problem List   Diagnosis Date Noted  . GI bleed 12/09/2014    Past Surgical History:  Procedure Laterality Date  . NEPHRECTOMY TRANSPLANTED ORGAN      Prior to Admission medications   Medication Sig Start Date End Date Taking? Authorizing Provider  ferrous sulfate 324 (65 FE) MG TBEC Take 1 tablet (325 mg total) by mouth 2 (two) times daily. 12/10/14  Yes Vaughan Basta, MD  hydroxychloroquine (PLAQUENIL) 200 MG tablet Take 200 mg by mouth 2 (two) times daily.   Yes Historical Provider, MD  pantoprazole (PROTONIX) 40 MG tablet Take 1 tablet (40 mg total) by mouth 2 (two) times daily. 12/10/14  Yes Vaughan Basta, MD  PREDNISONE PO Take 15 mg by mouth daily.   Yes Historical Provider, MD  sertraline (ZOLOFT) 100 MG tablet Take 200 mg by mouth at bedtime.   Yes  Historical Provider, MD  tamsulosin (FLOMAX) 0.4 MG CAPS capsule Take 0.8 mg by mouth at bedtime.   Yes Historical Provider, MD    Allergies Ciprofloxacin and Sulfa antibiotics  Family History  Problem Relation Age of Onset  . Hypertension Other     Social History Social History  Substance Use Topics  . Smoking status: Former Smoker    Packs/day: 0.50  . Smokeless tobacco: Not on file  . Alcohol use No    Review of Systems Constitutional: No fever/chills Eyes: No visual changes. ENT: No sore throat. Cardiovascular: Denies chest pain. Respiratory: Denies shortness of breath. Gastrointestinal:See history of present illness Genitourinary: Negative for dysuria. Musculoskeletal: Negative for back pain. Skin: Negative for rash. Neurological: Negative for headaches, focal weakness or numbness.  10-point ROS otherwise negative.  ____________________________________________   PHYSICAL EXAM:  VITAL SIGNS: ED Triage Vitals  Enc Vitals Group     BP 10/23/15 1346 (!) 82/69     Pulse Rate 10/23/15 1346 81     Resp 10/23/15 1346 11     Temp 10/23/15 1346 97.6 F (36.4 C)     Temp Source 10/23/15 1346 Oral     SpO2 10/23/15 1346 100 %     Weight 10/23/15 1347 202 lb (91.6 kg)     Height 10/23/15 1347 6\' 2"  (1.88 m)     Head Circumference --  Peak Flow --      Pain Score 10/23/15 1347 9     Pain Loc --      Pain Edu? --      Excl. in [place]? --     Constitutional: Alert and oriented. Ill-appearing appearing and in distress. Eyes: Conjunctivae are normal. PERRL. EOMI. Head: Atraumatic. Nose: No congestion/rhinnorhea. Mouth/Throat: Mucous membranes are moist.  Oropharynx non-erythematous. Neck: No stridor.  Cardiovascular: Normal rate, regular rhythm. Grossly normal heart sounds.  Good peripheral circulation. Respiratory: Normal respiratory effort.  No retractions. Lungs CTAB. Gastrointestinal: Soft decreased bowel sounds tender to palpation percussion in the left  lower quadrant and the right upper quadrant.. No distention. No abdominal bruits. No CVA tenderness. Musculoskeletal: No lower extremity tenderness nor edema.  No joint effusions. Neurologic:  Normal speech and language Skin:  Skin is warm, dry and intact. No rash noted. Psychiatric: Mood and affect are normal. Speech and behavior are normal.  ____________________________________________   LABS (all labs ordered are listed, but only abnormal results are displayed)  Labs Reviewed  COMPREHENSIVE METABOLIC PANEL - Abnormal; Notable for the following:       Result Value   Potassium 6.3 (*)    BUN 81 (*)    Creatinine, Ser 8.72 (*)    Calcium 7.5 (*)    Albumin 3.0 (*)    AST 99 (*)    Alkaline Phosphatase 159 (*)    GFR calc non Af Amer 6 (*)    GFR calc Af Amer 7 (*)    All other components within normal limits  CBC - Abnormal; Notable for the following:    RBC 2.69 (*)    Hemoglobin 7.9 (*)    HCT 24.1 (*)    RDW 17.4 (*)    Platelets 129 (*)    All other components within normal limits  DIFFERENTIAL - Abnormal; Notable for the following:    Neutro Abs 8.2 (*)    Lymphs Abs 0.8 (*)    All other components within normal limits  LIPASE, BLOOD  URINALYSIS COMPLETEWITH MICROSCOPIC ([facility] ONLY)  CBC WITH DIFFERENTIAL/PLATELET   ____________________________________________  EKG  EKG read and interpreted by me atrial flutter at a rate of 77 irregular block normal axis and no acute ST-T wave changes there is right bundle-branch block _________________________________________  RADIOLOGY  Study Result   CLINICAL DATA:  Severe generalized abdominal pain since this morning. History of renal transplant. Currently on hemodialysis.  EXAM: CT ABDOMEN AND PELVIS WITH CONTRAST  TECHNIQUE: Multidetector CT imaging of the abdomen and pelvis was performed using the standard protocol following bolus administration of intravenous contrast.  CONTRAST:  114mL ISOVUE-300 IOPAMIDOL  (ISOVUE-300) INJECTION 61%  COMPARISON:  None.  FINDINGS: Lower chest: Trace right pleural effusion. Mild bilateral lower lobe atelectasis. Diffuse ground-glass attenuation at both lung bases. Clustered pulmonary nodules at the left lung base measuring up to 6 mm in the peripheral left lower lobe (series 4/ image 14), new since 10/23/2003, appearing generally centrilobular in distribution. Cardiomegaly. Right coronary atherosclerosis. Partially visualized trace pericardial effusion/thickening.  Hepatobiliary: There is contrast reflux into the IVC and hepatic veins. Normal liver size. No liver mass. Diffuse gallbladder wall thickening. Faint densities in the gallbladder could represent sludge or tiny stones. No pericholecystic fluid. No biliary ductal dilatation.  Pancreas: Normal, with no mass or duct dilation.  Spleen: Normal size. No mass.  Adrenals/Urinary Tract: Normal adrenals. Symmetric atrophy of the native kidneys with no hydronephrosis in the native kidneys. Simple 1.4 cm  renal cyst in the lower native right kidney. Exophytic isodense 0.6 cm renal cortical lesion in the posterior lower left kidney, too small to characterize, requiring no further follow-up. This recommendation follows ACR consensus guidelines: Management of the Incidental Renal Mass on CT: A White Paper of the ACR Incidental Findings Committee. J Am Coll Radiol 2017; article in press. Right lower quadrant renal transplant is diffusely heterogeneously calcified. No discrete mass or perinephric fluid collection associated with the right lower quadrant transplant. Relatively collapsed bladder with borderline mild diffuse bladder wall thickening.  Stomach/Bowel: Grossly normal stomach. Normal caliber small bowel with no small bowel wall thickening. Normal appendix. Moderate diffuse colonic diverticulosis, with no large bowel wall thickening. Oral contrast progresses to the pelvic small  bowel.  Vascular/Lymphatic: Atherosclerotic nonaneurysmal abdominal aorta. Patent portal, splenic and native renal veins. Mild left para-aortic adenopathy measuring up to 1.1 cm (series 2/ image 44). No additional pathologically enlarged abdominopelvic nodes.  Reproductive: Normal size prostate.  Other: No pneumoperitoneum. Small volume ascites, predominantly perihepatic and deep pelvic. No focal intra-abdominal fluid collection.  Musculoskeletal: No aggressive appearing focal osseous lesions. Minimal lumbar spondylosis. Mild anasarca.  IMPRESSION: 1. No evidence of bowel obstruction or acute bowel inflammation. Normal appendix. Diffuse colonic diverticulosis, with no evidence of acute diverticulitis. 2. Cardiomegaly. Trace pericardial effusion/thickening. Contrast reflux into the IVC and hepatic veins, suggesting right heart failure. 3. Trace right pleural effusion. Mild anasarca. Small volume ascites. Findings suggest third spacing/fluid overload. 4. Faint tiny densities in the gallbladder could indicate sludge and/or tiny stones. Nonspecific diffuse gallbladder wall thickening, probably due to noninflammatory edema from the patient's hypoalbuminemia. If there is clinical concern for cholecystitis, recommend correlation with right upper quadrant abdominal sonogram. 5. Clustered pulmonary nodules at the left lung base measuring up to 6 mm, which appear centrilobular in distribution, suggesting infectious/inflammatory etiology. Non-contrast chest CT at 3-6 months is recommended. If the nodules are stable at time of repeat CT, then future CT at 18-24 months (from today's scan) is considered optional for low-risk patients, but is recommended for high-risk patients. This recommendation follows the consensus statement: Guidelines for Management of Incidental Pulmonary Nodules Detected on CT Images: From the Fleischner Society 2017; Radiology 2017; 284:228-243. 6. Mild left  para-aortic adenopathy, nonspecific. Recommend attention on a follow-up CT abdomen/pelvis (with IV contrast if clinically feasible, otherwise without IV contrast) in 3 months. 7. Aortic atherosclerosis.  Coronary atherosclerosis. 8. Symmetric severe atrophy of the native renal kidneys, consistent with end-stage renal disease. Right lower quadrant renal transplant is heterogeneously calcified, suggesting a failed transplant.   Electronically Signed   By: Ilona Sorrel M.D.   On: 10/23/2015 17:40    Study Result   CLINICAL DATA:  Right upper quadrant pain. History of renal transplant.  EXAM: US ABDOMEN LIMITED - RIGHT UPPER QUADRANT  COMPARISON:  Abdominal CT 10/23/2015  FINDINGS: Gallbladder:  Diffuse gallbladder wall thickening, measuring up to 1.3 cm. Gallbladder is not distended. Gallbladder wall thickening is asymmetric and there is a small amount of pericholecystic fluid. There are no gallstones. Reportedly, the patient does not have a sonographic Murphy's sign.  Common bile duct:  Diameter: Measures 0.4 cm.  Liver:  No focal lesion identified. Within normal limits in parenchymal echogenicity. Main portal vein is patent.  Other: Perihepatic ascites along the right side of the liver. Transplant kidney in the right lower quadrant abdomen. Echogenic foci in transplant kidney are compatible calcifications.  IMPRESSION: Gallbladder is diffusely thickened with a small amount of surrounding fluid. There  are no gallstones and the patient does not have a sonographic Murphy sign. Gallbladder wall thickening could be associated with the ascites. Acalculous cholecystitis cannot be excluded.  Ascites.  No biliary dilatation.   Electronically Signed   By: Markus Daft M.D.   On: 10/23/2015 19:30    ____________________________________________   PROCEDURES  Procedure(s) performed:   Procedures  Critical Care  performed:  ____________________________________________   INITIAL IMPRESSION / ASSESSMENT AND PLAN / ED COURSE  Pertinent labs & imaging results that were available during my care of the patient were reviewed by me and considered in my medical decision making (see chart for details).    Clinical Course   Patient reports his abdomen feels much better. He does not want to come into the hospital. He does not want [place] to try and contact the [place] and see if he can go down to the [place]. He has set up an appointment for himself to get dialysis tomorrow while he was here. He will follow-up with his doctor to check on his abdomen. He thinks that his abdomen is just hurting because he walked too much yesterday. He says he's had this before several times. He reminds me that he has told me that his right leg does swell up when he walks too much. This is nothing new. He promises to come back if he gets worse especially if he has any vomiting or anything else.  ____________________________________________   FINAL CLINICAL IMPRESSION(S) / ED DIAGNOSES  Final diagnoses:  Right upper quadrant abdominal pain  Hyperkalemia      NEW MEDICATIONS STARTED DURING THIS VISIT:  New Prescriptions   No medications on file     Note:  This document was prepared using Dragon voice recognition software and may include unintentional dictation errors.    Nena Polio, MD 10/23/15 1945    Nena Polio, MD 10/23/15 [phone]

## 2015-10-23 NOTE — ED Notes (Signed)
Pt unable to finish contrast, MD Malinda notified, CT called at this time

## 2015-10-23 NOTE — ED Notes (Signed)
Pt attends dialysis T, TH, Sat. Missed today's session d/t not feeling well.

## 2015-10-23 NOTE — Discharge Instructions (Signed)
I would prefer and advise that you stay in the hospital but as I explained, I cannot keep you against your will. I would not mind at all calling the [place] and there is a good chance that the [place] will not want you to come down there although of course I can't guarantee that. Please make sure you return if you get worse especially worse pain fever vomiting or feeling sicker. Please make sure to go to your dialysis appointment in the morning.

## 2015-10-23 NOTE — H&P (Signed)
Newborn at [place] NAME: [name]    MR#:  [id number]  DATE OF BIRTH:  11/16/1970  DATE OF ADMISSION:  10/23/2015  PRIMARY CARE PHYSICIAN: [facility] [place]   REQUESTING/REFERRING PHYSICIAN: Dr Conni Slipper  CHIEF COMPLAINT:   Chief Complaint  Patient presents with  . Abdominal Pain    HISTORY OF PRESENT ILLNESS:  [name]  is a 45 y.o. male with a known history of End-stage renal disease, lupus. He presents to the ER with abdominal pain. The pain woke him up this morning. It started out as a stomach ache. The pain wouldn't go away. He felt like he had to use the bathroom and then he felt like he was going to pass out. The pain was worse and worse at that time 9 out of 10 intensity. Nothing made it better or worse until pain medication in the ER helped a little bit. He described the pain in his lower abdomen that he had bilateral shoulder blade pain. Associated with some nausea and had semisolid stools for the last 2 days. The ER physician did a CT scan and an ultrasound of the abdomen. The ER physician called the nephrologist who recommended dialysis after the CT scan. The patient told me that he did not want to stay in the hospital that he called his dialysis center to get dialysis tomorrow.  PAST MEDICAL HISTORY:   Past Medical History:  Diagnosis Date  . Anemia   . Collagen vascular disease ([place])   . Hypertension   . Lupus   . Renal disorder   . Renal insufficiency   . Renal transplant recipient     PAST SURGICAL HISTORY:   Past Surgical History:  Procedure Laterality Date  . NEPHRECTOMY TRANSPLANTED ORGAN      SOCIAL HISTORY:   Social History  Substance Use Topics  . Smoking status: Former Smoker    Packs/day: 0.50  . Smokeless tobacco: Never Used  . Alcohol use No    FAMILY HISTORY:   Family History  Problem Relation Age of Onset  . Hypertension Other   . Brain cancer Mother   . Aneurysm Mother   .  CAD Father     DRUG ALLERGIES:   Allergies  Allergen Reactions  . Ciprofloxacin   . Sulfa Antibiotics     REVIEW OF SYSTEMS:  CONSTITUTIONAL: No fever. Hot feeling. Positive for fatigue EYES: No blurred or double vision.  EARS, NOSE, AND THROAT: No tinnitus or ear pain. No sore throat. Positive for runny nose RESPIRATORY: No cough, positive for shortness of breath. No wheezing or hemoptysis.  CARDIOVASCULAR: No chest pain, orthopnea, edema.  GASTROINTESTINAL: Positive for nausea, diarrhea and abdominal pain. No blood in bowel movements GENITOURINARY: Patient does self catheterizations ENDOCRINE: No polyuria, nocturia,  HEMATOLOGY: No anemia, easy bruising or bleeding SKIN: No rash or lesion. MUSCULOSKELETAL: Positive for joint pain.   NEUROLOGIC: No tingling, numbness, weakness.  PSYCHIATRY: No anxiety or depression.   MEDICATIONS AT HOME:   Prior to Admission medications   Medication Sig Start Date End Date Taking? Authorizing Provider  ferrous sulfate 324 (65 FE) MG TBEC Take 1 tablet (325 mg total) by mouth 2 (two) times daily. 12/10/14  Yes Vaughan Basta, MD  hydroxychloroquine (PLAQUENIL) 200 MG tablet Take 200 mg by mouth 2 (two) times daily.   Yes Historical Provider, MD  pantoprazole (PROTONIX) 40 MG tablet Take 1 tablet (40 mg total) by mouth 2 (two) times daily. 12/10/14  Yes Vaughan Basta, MD  PREDNISONE PO Take 15 mg by mouth daily.   Yes Historical Provider, MD  sertraline (ZOLOFT) 100 MG tablet Take 200 mg by mouth at bedtime.   Yes Historical Provider, MD  tamsulosin (FLOMAX) 0.4 MG CAPS capsule Take 0.8 mg by mouth at bedtime.   Yes Historical Provider, MD    Patient states he takes Coumadin 2.5 mg 2 tablets on Monday Wednesday and Friday. One tablet the rest of the week.  VITAL SIGNS:  Blood pressure 114/79, pulse 95, temperature 97.6 F (36.4 C), temperature source Oral, resp. rate 18, height 6\' 2"  (1.88 m), weight 91.6 kg (202 lb), SpO2 100  %.  PHYSICAL EXAMINATION:  GENERAL:  45 y.o.-year-old patient lying in the bed with no acute distress.  EYES: Pupils equal, round, reactive to light and accommodation. No scleral icterus. Extraocular muscles intact.  HEENT: Head atraumatic, normocephalic. Oropharynx and nasopharynx clear.  NECK:  Supple, no jugular venous distention. No thyroid enlargement, no tenderness.  LUNGS: Normal breath sounds bilaterally, no wheezing, rales,rhonchi or crepitation. No use of accessory muscles of respiration.  CARDIOVASCULAR: S1, S2 normal. No murmurs, rubs, or gallops.  ABDOMEN: Soft, Slight distention, some generalized tenderness, Bowel sounds present. No organomegaly or mass.  EXTREMITIES: No pedal edema, cyanosis, or clubbing.  NEUROLOGIC: Cranial nerves II through XII are intact. Muscle strength 5/5 in all extremities. Sensation intact. Gait not checked.  PSYCHIATRIC: The patient is alert and oriented x 3.  SKIN: No rash, lesion, or ulcer.   LABORATORY PANEL:   CBC  Recent Labs Lab 10/23/15 1350  WBC 9.9  HGB 7.9*  HCT 24.1*  PLT 129*   ------------------------------------------------------------------------------------------------------------------  Chemistries   Recent Labs Lab 10/23/15 1350  NA 135  K 6.3*  CL 101  CO2 24  GLUCOSE 90  BUN 81*  CREATININE 8.72*  CALCIUM 7.5*  AST 99*  ALT 58  ALKPHOS 159*  BILITOT 1.2   ------------------------------------------------------------------------------------------------------------------    RADIOLOGY:  Ct Abdomen Pelvis W Contrast  Result Date: 10/23/2015 CLINICAL DATA:  Severe generalized abdominal pain since this morning. History of renal transplant. Currently on hemodialysis. EXAM: CT ABDOMEN AND PELVIS WITH CONTRAST TECHNIQUE: Multidetector CT imaging of the abdomen and pelvis was performed using the standard protocol following bolus administration of intravenous contrast. CONTRAST:  134mL ISOVUE-300 IOPAMIDOL  (ISOVUE-300) INJECTION 61% COMPARISON:  None. FINDINGS: Lower chest: Trace right pleural effusion. Mild bilateral lower lobe atelectasis. Diffuse ground-glass attenuation at both lung bases. Clustered pulmonary nodules at the left lung base measuring up to 6 mm in the peripheral left lower lobe (series 4/ image 14), new since 10/23/2003, appearing generally centrilobular in distribution. Cardiomegaly. Right coronary atherosclerosis. Partially visualized trace pericardial effusion/thickening. Hepatobiliary: There is contrast reflux into the IVC and hepatic veins. Normal liver size. No liver mass. Diffuse gallbladder wall thickening. Faint densities in the gallbladder could represent sludge or tiny stones. No pericholecystic fluid. No biliary ductal dilatation. Pancreas: Normal, with no mass or duct dilation. Spleen: Normal size. No mass. Adrenals/Urinary Tract: Normal adrenals. Symmetric atrophy of the native kidneys with no hydronephrosis in the native kidneys. Simple 1.4 cm renal cyst in the lower native right kidney. Exophytic isodense 0.6 cm renal cortical lesion in the posterior lower left kidney, too small to characterize, requiring no further follow-up. This recommendation follows ACR consensus guidelines: Management of the Incidental Renal Mass on CT: A White Paper of the ACR Incidental Findings Committee. J Am Coll Radiol 2017; article in press. Right lower quadrant  renal transplant is diffusely heterogeneously calcified. No discrete mass or perinephric fluid collection associated with the right lower quadrant transplant. Relatively collapsed bladder with borderline mild diffuse bladder wall thickening. Stomach/Bowel: Grossly normal stomach. Normal caliber small bowel with no small bowel wall thickening. Normal appendix. Moderate diffuse colonic diverticulosis, with no large bowel wall thickening. Oral contrast progresses to the pelvic small bowel. Vascular/Lymphatic: Atherosclerotic nonaneurysmal abdominal  aorta. Patent portal, splenic and native renal veins. Mild left para-aortic adenopathy measuring up to 1.1 cm (series 2/ image 44). No additional pathologically enlarged abdominopelvic nodes. Reproductive: Normal size prostate. Other: No pneumoperitoneum. Small volume ascites, predominantly perihepatic and deep pelvic. No focal intra-abdominal fluid collection. Musculoskeletal: No aggressive appearing focal osseous lesions. Minimal lumbar spondylosis. Mild anasarca. IMPRESSION: 1. No evidence of bowel obstruction or acute bowel inflammation. Normal appendix. Diffuse colonic diverticulosis, with no evidence of acute diverticulitis. 2. Cardiomegaly. Trace pericardial effusion/thickening. Contrast reflux into the IVC and hepatic veins, suggesting right heart failure. 3. Trace right pleural effusion. Mild anasarca. Small volume ascites. Findings suggest third spacing/fluid overload. 4. Faint tiny densities in the gallbladder could indicate sludge and/or tiny stones. Nonspecific diffuse gallbladder wall thickening, probably due to noninflammatory edema from the patient's hypoalbuminemia. If there is clinical concern for cholecystitis, recommend correlation with right upper quadrant abdominal sonogram. 5. Clustered pulmonary nodules at the left lung base measuring up to 6 mm, which appear centrilobular in distribution, suggesting infectious/inflammatory etiology. Non-contrast chest CT at 3-6 months is recommended. If the nodules are stable at time of repeat CT, then future CT at 18-24 months (from today's scan) is considered optional for low-risk patients, but is recommended for high-risk patients. This recommendation follows the consensus statement: Guidelines for Management of Incidental Pulmonary Nodules Detected on CT Images: From the Fleischner Society 2017; Radiology 2017; 284:228-243. 6. Mild left para-aortic adenopathy, nonspecific. Recommend attention on a follow-up CT abdomen/pelvis (with IV contrast if  clinically feasible, otherwise without IV contrast) in 3 months. 7. Aortic atherosclerosis.  Coronary atherosclerosis. 8. Symmetric severe atrophy of the native renal kidneys, consistent with end-stage renal disease. Right lower quadrant renal transplant is heterogeneously calcified, suggesting a failed transplant. Electronically Signed   By: Ilona Sorrel M.D.   On: 10/23/2015 17:40   US Abdomen Limited Ruq  Result Date: 10/23/2015 CLINICAL DATA:  Right upper quadrant pain. History of renal transplant. EXAM: US ABDOMEN LIMITED - RIGHT UPPER QUADRANT COMPARISON:  Abdominal CT 10/23/2015 FINDINGS: Gallbladder: Diffuse gallbladder wall thickening, measuring up to 1.3 cm. Gallbladder is not distended. Gallbladder wall thickening is asymmetric and there is a small amount of pericholecystic fluid. There are no gallstones. Reportedly, the patient does not have a sonographic Murphy's sign. Common bile duct: Diameter: Measures 0.4 cm. Liver: No focal lesion identified. Within normal limits in parenchymal echogenicity. Main portal vein is patent. Other: Perihepatic ascites along the right side of the liver. Transplant kidney in the right lower quadrant abdomen. Echogenic foci in transplant kidney are compatible calcifications. IMPRESSION: Gallbladder is diffusely thickened with a small amount of surrounding fluid. There are no gallstones and the patient does not have a sonographic Murphy sign. Gallbladder wall thickening could be associated with the ascites. Acalculous cholecystitis cannot be excluded. Ascites. No biliary dilatation. Electronically Signed   By: Markus Daft M.D.   On: 10/23/2015 19:30    EKG:   Atrial fibrillation, right bundle branch block, flipped T waves laterally  IMPRESSION AND PLAN:   1. Abdominal pain unspecified. The patient does not have a surgical  abdomen. CT scan did not show any causes of abdominal pain. Ultrasound was not remarkable either. Patient states he wants to go home. I told  the ER physician that he wants to go home, he will be the one doing the disposition. 2. End-stage renal disease on hemodialysis Tuesday Thursday and Saturday. The patient states that he spoke with his dialysis center and they can get him in tomorrow. 3. Hyperkalemia. Dialysis needed in order to lower potassium. 4. Atrial fibrillation on Coumadin. ER physician never sent off an INR. 5. Essential hypertension continue usual medications 6. Chronic respiratory failure continue oxygen supplementation 7. Anemia of chronic disease Procrit needed with dialysis. Hemoglobin 7.9 here. 8. Pulmonary nodules and on CT scan of the chest. Recommend repeat CT scan 3-6 months  All the records are reviewed and case discussed with ED provider. Management plans discussed with the patient, and he told me he is going home.  CODE STATUS: Full code  TOTAL TIME TAKING CARE OF THIS PATIENT: 50 minutes.    Loletha Grayer M.D on 10/23/2015 at 8:49 PM  Between 7am to 6pm - Pager - [phone]  After 6pm call admission pager [phone]  [facility]  [phone]  CC: Primary care physician; [place] [facility]

## 2016-03-26 ENCOUNTER — Encounter: Payer: Self-pay | Admitting: Emergency Medicine

## 2016-03-26 ENCOUNTER — Emergency Department
Admission: EM | Admit: 2016-03-26 | Discharge: 2016-03-27 | Disposition: A | Discharge status: Other Acute Inpatient Hospital | Payer: Non-veteran care | Attending: Emergency Medicine | Admitting: Emergency Medicine

## 2016-03-26 DIAGNOSIS — K922 Gastrointestinal hemorrhage, unspecified: Secondary | ICD-10-CM | POA: Diagnosis not present

## 2016-03-26 DIAGNOSIS — R571 Hypovolemic shock: Secondary | ICD-10-CM | POA: Diagnosis not present

## 2016-03-26 DIAGNOSIS — Z87891 Personal history of nicotine dependence: Secondary | ICD-10-CM | POA: Insufficient documentation

## 2016-03-26 DIAGNOSIS — I10 Essential (primary) hypertension: Secondary | ICD-10-CM | POA: Insufficient documentation

## 2016-03-26 DIAGNOSIS — K625 Hemorrhage of anus and rectum: Secondary | ICD-10-CM | POA: Diagnosis present

## 2016-03-26 DIAGNOSIS — Z79899 Other long term (current) drug therapy: Secondary | ICD-10-CM | POA: Diagnosis not present

## 2016-03-26 DIAGNOSIS — R1032 Left lower quadrant pain: Secondary | ICD-10-CM | POA: Diagnosis not present

## 2016-03-26 DIAGNOSIS — R0602 Shortness of breath: Secondary | ICD-10-CM

## 2016-03-26 MED ORDER — SODIUM CHLORIDE 0.9 % IV SOLN: Freq: Once | INTRAVENOUS | Status: DC

## 2016-03-26 MED ORDER — SODIUM CHLORIDE 0.9 % IV SOLN
Freq: Once | INTRAVENOUS | Status: AC
  Administered 2016-03-26: via INTRAVENOUS

## 2016-03-26 MED ORDER — IOPAMIDOL (ISOVUE-300) INJECTION 61%
30.0000 mL | INTRAVENOUS | Status: AC
  Administered 2016-03-27: 15 mL via ORAL

## 2016-03-26 NOTE — ED Triage Notes (Addendum)
Patient comes in from home via [facility] with bright red bleeding from his rectum. Patient reports about 3-4 episodes. Patient is on coumadin for blood clot. Patient also reports abdominal pain. Patient wears O2 chronically at home 4L. EDP at bedside. Patient also dialysis patient goes Tuesday, Thursday and Saturday

## 2016-03-27 ENCOUNTER — Ambulatory Visit (HOSPITAL_COMMUNITY)
Admission: AD | Admit: 2016-03-27 | Discharge: 2016-03-27 | Disposition: A | Discharge status: Home / Self Care | Payer: Non-veteran care | Source: Other Acute Inpatient Hospital | Attending: Emergency Medicine | Admitting: Emergency Medicine

## 2016-03-27 ENCOUNTER — Emergency Department: Payer: Non-veteran care

## 2016-03-27 DIAGNOSIS — K922 Gastrointestinal hemorrhage, unspecified: Secondary | ICD-10-CM | POA: Insufficient documentation

## 2016-03-27 LAB — CBC WITH DIFFERENTIAL/PLATELET
BAND NEUTROPHILS: 0 %
BLASTS: 0 %
Basophils Absolute: 0 10*3/uL (ref 0–0.1)
Basophils Relative: 0 %
Eosinophils Absolute: 0.2 10*3/uL (ref 0–0.7)
Eosinophils Relative: 1 %
HEMATOCRIT: 24.1 % — AB (ref 40.0–52.0)
HEMOGLOBIN: 7.7 g/dL — AB (ref 13.0–18.0)
Lymphocytes Relative: 11 %
Lymphs Abs: 1.9 10*3/uL (ref 1.0–3.6)
MCH: 29.1 pg (ref 26.0–34.0)
MCHC: 31.8 g/dL — ABNORMAL LOW (ref 32.0–36.0)
MCV: 91.4 fL (ref 80.0–100.0)
Metamyelocytes Relative: 0 %
Monocytes Absolute: 1 10*3/uL (ref 0.2–1.0)
Monocytes Relative: 6 %
Myelocytes: 0 %
NEUTROS PCT: 82 %
NRBC: 3 /100{WBCs} — AB
Neutro Abs: 13.9 10*3/uL — ABNORMAL HIGH (ref 1.4–6.5)
OTHER: 0 %
PROMYELOCYTES ABS: 0 %
Platelets: 104 10*3/uL — ABNORMAL LOW (ref 150–440)
RBC: 2.64 MIL/uL — ABNORMAL LOW (ref 4.40–5.90)
RDW: 23.1 % — ABNORMAL HIGH (ref 11.5–14.5)
WBC: 17 10*3/uL — ABNORMAL HIGH (ref 3.8–10.6)

## 2016-03-27 LAB — COMPREHENSIVE METABOLIC PANEL
ALBUMIN: 2.7 g/dL — AB (ref 3.5–5.0)
ALK PHOS: 90 U/L (ref 38–126)
ALT: 14 U/L — AB (ref 17–63)
AST: 20 U/L (ref 15–41)
Anion gap: 11 (ref 5–15)
BILIRUBIN TOTAL: 0.8 mg/dL (ref 0.3–1.2)
BUN: 51 mg/dL — AB (ref 6–20)
CALCIUM: 8.2 mg/dL — AB (ref 8.9–10.3)
CO2: 24 mmol/L (ref 22–32)
CREATININE: 7.2 mg/dL — AB (ref 0.61–1.24)
Chloride: 107 mmol/L (ref 101–111)
GFR calc Af Amer: 9 mL/min — ABNORMAL LOW (ref >60)
GFR calc non Af Amer: 8 mL/min — ABNORMAL LOW (ref >60)
GLUCOSE: 98 mg/dL (ref 65–99)
Potassium: 3.8 mmol/L (ref 3.5–5.1)
Sodium: 142 mmol/L (ref 135–145)
TOTAL PROTEIN: 5.5 g/dL — AB (ref 6.5–8.1)

## 2016-03-27 LAB — LIPASE, BLOOD: Lipase: 23 U/L (ref 11–51)

## 2016-03-27 LAB — HEMOGLOBIN AND HEMATOCRIT, BLOOD
HCT: 24.2 % — ABNORMAL LOW (ref 40.0–52.0)
Hemoglobin: 7.6 g/dL — ABNORMAL LOW (ref 13.0–18.0)

## 2016-03-27 LAB — PREPARE RBC (CROSSMATCH)

## 2016-03-27 LAB — PROTIME-INR
INR: 3.31
Prothrombin Time: 34.4 seconds — ABNORMAL HIGH (ref 11.4–15.2)

## 2016-03-27 MED ORDER — ONDANSETRON HCL 4 MG/2ML IJ SOLN
INTRAMUSCULAR | Status: AC
  Filled 2016-03-27: qty 2

## 2016-03-27 MED ORDER — LORAZEPAM 2 MG/ML IJ SOLN
INTRAMUSCULAR | Status: AC
  Filled 2016-03-27: qty 1

## 2016-03-27 MED ORDER — SODIUM CHLORIDE 0.9 % IV SOLN: 10.0000 mL/h | Freq: Once | INTRAVENOUS | Status: DC

## 2016-03-27 MED ORDER — HYDROMORPHONE HCL 1 MG/ML IJ SOLN
INTRAMUSCULAR | Status: AC
  Filled 2016-03-27: qty 1

## 2016-03-27 MED ORDER — MORPHINE SULFATE (PF) 2 MG/ML IV SOLN
INTRAVENOUS | Status: AC
  Filled 2016-03-27: qty 1

## 2016-03-27 MED ORDER — ONDANSETRON HCL 4 MG/2ML IJ SOLN
4.0000 mg | Freq: Once | INTRAMUSCULAR | Status: AC
  Administered 2016-03-27: 4 mg via INTRAVENOUS

## 2016-03-27 MED ORDER — VITAMIN K1 10 MG/ML IJ SOLN
10.0000 mg | Freq: Once | INTRAMUSCULAR | Status: AC
  Administered 2016-03-27: 10 mg via INTRAVENOUS
  Filled 2016-03-27: qty 1

## 2016-03-27 MED ORDER — HYDROMORPHONE HCL 1 MG/ML IJ SOLN
0.5000 mg | Freq: Once | INTRAMUSCULAR | Status: AC
  Administered 2016-03-27: 0.5 mg via INTRAVENOUS

## 2016-03-27 MED ORDER — MORPHINE SULFATE (PF) 2 MG/ML IV SOLN
2.0000 mg | Freq: Once | INTRAVENOUS | Status: AC
  Administered 2016-03-27: 2 mg via INTRAVENOUS

## 2016-03-27 NOTE — ED Notes (Signed)
1 Unit of FFP verified with [facility] Unit #: [id number]

## 2016-03-27 NOTE — ED Notes (Signed)
4th unit of blood given. Unit: [id number] Component: [id number] Exp 04/11/16 Volume 312mL  Verified by Fara Chute, RN Rate: 160ml/hr

## 2016-03-27 NOTE — ED Notes (Signed)
Report given to [place] with [facility]

## 2016-03-27 NOTE — ED Notes (Signed)
1unit emergency blood verified with care nurse Fara Chute, RN prior to infusion

## 2016-03-27 NOTE — ED Notes (Signed)
Second FFP increased rate to 300 ml/hr.

## 2016-03-27 NOTE — ED Notes (Addendum)
1st unit of FFP started 0157. Verified by Sharen Hones.   KICH:[id number]. At 119ml/hr for first 15 minutes. Component: thawed plasma   Volume 355mL Exp 04/01/16 @ 2359

## 2016-03-27 NOTE — ED Notes (Addendum)
Per EDP when systolic is greater than 034 give pain medication.   Per EDP keep blood at 161ml/hour.

## 2016-03-27 NOTE — ED Notes (Signed)
3rd unit of blood started.  Unit: [id number]  Component: [id number].   268ml total volume Expires 04/09/16  Verified with Fara Chute, RN. Started at 119ml/hr   This unit went with [facility] to [facility]

## 2016-03-27 NOTE — ED Notes (Signed)
2nd unit emergency blood verified with care nurse Fara Chute, RN prior to infusion

## 2016-03-27 NOTE — ED Notes (Signed)
Patient having abdominal pain. States that blood is coming out of rectum, this Rn, Kathlee Nations, NT cleaned patient up. Patient had moderate to large amount of dark red blood and blood clots. This RN grabbed EDP out of room for him to assess output. No new orders at this time. Awaiting on emergent blood.

## 2016-03-27 NOTE — ED Notes (Signed)
2nd unit of blood complete. Vitals in flow sheet

## 2016-03-27 NOTE — ED Notes (Addendum)
Patient given emergent blood.  Unit # [id number].  Component Red Cells, LR  Volume 372mL Exp: 04/10/16 @ 8921   Verified by this RN and Harriette Bouillon. Started at 162ml/hour.

## 2016-03-27 NOTE — ED Notes (Addendum)
1st unit of Blood was originally started at [place]. Patient complained of chest discomfort. This RN paused blood and grabbed EDP. EDP gave verbal order to slow blood from 123ml/hour to 137ml/hour

## 2016-03-27 NOTE — ED Notes (Signed)
First unit of blood complete. Vitals in flow sheet.

## 2016-03-27 NOTE — ED Notes (Addendum)
3rd unit of blood started.  Unit # [id number]  component# [id number].   278ml total volume Expires 04/09/16  Verified with [place]ers Rn. Started at 136ml/hr   [facility]

## 2016-03-27 NOTE — ED Provider Notes (Signed)
[facility] Emergency Department Provider Note   First MD Initiated Contact with Patient 03/26/16 2340     (approximate)  I have reviewed the triage vital signs and the nursing notes.   HISTORY  Chief Complaint GI Bleeding   HPI [name] is a 46 y.o. male [place] chronic medical conditions including previous GI bleed approximately 10 years ago presents to the emergency department via EMS for acute onset of bright red blood per rectum 4 episodes at home. Per EMS large volume of blood loss noted at the patient's home. Patient hypotensive on EMS arrival with a stated systolic blood pressure of 80. Patient admits to 10 out of 10 generalized abdominal discomfort worse in the left lower quadrant. Of note patient is currently taking anticoagulation (sees Coumadin).   Past Medical History:  Diagnosis Date  . Anemia   . Collagen vascular disease ([place])   . Hypertension   . Lupus   . Renal disorder   . Renal insufficiency   . Renal transplant recipient     Patient Active Problem List   Diagnosis Date Noted  . GI bleed 12/09/2014    Past Surgical History:  Procedure Laterality Date  . NEPHRECTOMY TRANSPLANTED ORGAN      Prior to Admission medications   Medication Sig Start Date End Date Taking? Authorizing Provider  ferrous sulfate 324 (65 FE) MG TBEC Take 1 tablet (325 mg total) by mouth 2 (two) times daily. 12/10/14   Vaughan Basta, MD  hydroxychloroquine (PLAQUENIL) 200 MG tablet Take 200 mg by mouth 2 (two) times daily.    Historical Provider, MD  pantoprazole (PROTONIX) 40 MG tablet Take 1 tablet (40 mg total) by mouth 2 (two) times daily. 12/10/14   Vaughan Basta, MD  PREDNISONE PO Take 15 mg by mouth daily.    Historical Provider, MD  sertraline (ZOLOFT) 100 MG tablet Take 200 mg by mouth at bedtime.    Historical Provider, MD  tamsulosin (FLOMAX) 0.4 MG CAPS capsule Take 0.8 mg by mouth at bedtime.    Historical Provider, MD      Allergies Ciprofloxacin and Sulfa antibiotics  Family History  Problem Relation Age of Onset  . Hypertension Other   . Brain cancer Mother   . Aneurysm Mother   . CAD Father     Social History Social History  Substance Use Topics  . Smoking status: Former Smoker    Packs/day: 0.50  . Smokeless tobacco: Never Used  . Alcohol use No    Review of Systems onstitutional: No fever/chills Eyes: No visual changes. ENT: No sore throat. Cardiovascular: Denies chest pain. Respiratory: Denies shortness of breath. Gastrointestinal: Positive for generalized abdominal pain and bright red blood per rectum.  Genitourinary: Negative for dysuria. Musculoskeletal: Negative for back pain. Skin: Negative for rash. Neurological: Negative for headaches, focal weakness or numbness.  10-point ROS otherwise negative.  ____________________________________________   PHYSICAL EXAM:  VITAL SIGNS: ED Triage Vitals  Enc Vitals Group     BP 03/26/16 2343 103/61     Pulse Rate 03/26/16 2344 (!) 145     Resp 03/26/16 2343 16     Temp 03/26/16 2344 97.5 F (36.4 C)     Temp Source 03/26/16 2344 Oral     SpO2 03/26/16 2344 100 %     Weight 03/26/16 2345 191 lb 12.8 oz (87 kg)     Height 03/26/16 2345 6\' 2"  (1.88 m)     Head Circumference --  Peak Flow --      Pain Score 03/27/16 0133 9     Pain Loc --      Pain Edu? --      Excl. in [place]? --     Constitutional: Alert and oriented. Apparent discomfort Eyes: Conjunctivae are Pale. PERRL. EOMI. Head: Atraumatic. Nose: No congestion/rhinnorhea. Mouth/Throat: Mucous membranes are moist.Oral mucosa pale  Neck: No stridor.   Cardiovascular: Tachycardia, regular rhythm. Good peripheral circulation. Grossly normal heart sounds. Respiratory: Normal respiratory effort.  No retractions. Lungs CTAB. Gastrointestinal: Generalized tenderness to palpation worse left lower quadrant Bright red blood per rectum with large clots. Musculoskeletal:  No lower extremity tenderness nor edema. No gross deformities of extremities. Neurologic:  Normal speech and language. No gross focal neurologic deficits are appreciated.  Skin:  Skin is warm, dry and intact. No rash noted. Psychiatric: Mood and affect are normal. Speech and behavior are normal.  ____________________________________________   LABS (all labs ordered are listed, but only abnormal results are displayed)  Labs Reviewed  CBC WITH DIFFERENTIAL/PLATELET - Abnormal; Notable for the following:       Result Value   WBC 17.0 (*)    RBC 2.64 (*)    Hemoglobin 7.7 (*)    HCT 24.1 (*)    MCHC 31.8 (*)    RDW 23.1 (*)    Platelets 104 (*)    nRBC 3 (*)    Neutro Abs 13.9 (*)    All other components within normal limits  COMPREHENSIVE METABOLIC PANEL - Abnormal; Notable for the following:    BUN 51 (*)    Creatinine, Ser 7.20 (*)    Calcium 8.2 (*)    Total Protein 5.5 (*)    Albumin 2.7 (*)    ALT 14 (*)    GFR calc non Af Amer 8 (*)    GFR calc Af Amer 9 (*)    All other components within normal limits  PROTIME-INR - Abnormal; Notable for the following:    Prothrombin Time 34.4 (*)    All other components within normal limits  HEMOGLOBIN AND HEMATOCRIT, BLOOD - Abnormal; Notable for the following:    Hemoglobin 7.6 (*)    HCT 24.2 (*)    All other components within normal limits  LIPASE, BLOOD  TYPE AND SCREEN  PREPARE RBC (CROSSMATCH)  PREPARE FRESH FROZEN PLASMA  PREPARE RBC (CROSSMATCH)   ____________________________________________  EKG  ED ECG REPORT I, [place] N BROWN, the attending physician, personally viewed and interpreted this ECG.   Date: 03/27/2016  EKG Time: 11:45 PM  Rate: 145  Rhythm: Sinus tachycardic  Axis: Normal  Intervals: Normal  ST&T Change: None  ____________________________________________  RADIOLOGY I, [place] N BROWN, personally viewed and evaluated these images (plain radiographs) as part of my medical decision making,  as well as reviewing the written report by the radiologist.  Ct Abdomen Pelvis Wo Contrast  Result Date: 03/27/2016 CLINICAL DATA:  Gastrointestinal bleeding. Patient on Coumadin for blood clot. EXAM: CT ABDOMEN AND PELVIS WITHOUT CONTRAST TECHNIQUE: Multidetector CT imaging of the abdomen and pelvis was performed following the standard protocol without IV contrast. COMPARISON:  10/23/2015 CT abdomen and pelvis FINDINGS: Lower chest: Stable cardiomegaly with right coronary arteriosclerosis and valvular calcifications partially imaged. Trace pericardial effusion. Scarring and/or atelectasis at the lung bases right greater than left. Nodular densities in the subpleural left lower lobe are no longer apparent. Hepatobiliary: No space-occupying mass of the liver. Gallbladder is free of stones. No wall thickening nor  pericholecystic fluid. Pancreas: Normal Spleen: Normal in size without focal abnormality. Adrenals/Urinary Tract: Bilateral renal atrophy. No obstructive uropathy of the native kidneys. 1.5 cm simple right lower pole renal cyst. Bilateral nephrolithiasis. 5 mm posterior left renal exophytic hyperdense lesion may represent a hemorrhagic or proteinaceous cyst. This is too small to further characterize. 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 No discrete mass or perinephric fluid collections associated with the transplanted kidney. Slightly under distended urinary bladder with without calculus or focal mass. Stomach/Bowel: Contracted stomach. No small bowel dilatation or inflammation. There is extensive colonic diverticulosis. Appendix is not visualized. Vascular/Lymphatic: Aorto bi-iliac atherosclerosis without aneurysm. Small para-aortic lymph nodes are again seen. Reproductive: Normal size prostate. Other: Trace free fluid in the pelvis. Mild anasarca. Soft tissue nodular density over the right flank laterally may represent a site for injection of medication.  Musculoskeletal: No acute nor suspicious osseous lesions. IMPRESSION: 1. Extensive colonic diverticulosis; this in conjunction with history of being on Coumadin may be the source of the patient's bright red blood per rectum. 2. Trace ascites and anasarca. 3. Atrophied native bilateral kidneys with nephrolithiasis, no hydronephrosis and simple as well as complex cysts. A transplanted partially calcified right pelvic kidney is again seen without significant change. 4. Stable cardiomegaly with coronary arteriosclerosis. Electronically Signed   By: Ashley Royalty M.D.   On: 03/27/2016 02:56   Dg Chest Port 1 View  Result Date: 03/27/2016 CLINICAL DATA:  Hematochezia, several episodes today. Anticoagulated. EXAM: PORTABLE CHEST 1 VIEW COMPARISON:  10/18/2003 FINDINGS: Moderate cardiomegaly, new from 2005. Small right pleural effusion. Mild vascular and interstitial prominence. No focal airspace consolidation. IMPRESSION: Cardiomegaly. Vascular and interstitial changes suggest a degree of congestive heart failure. Small right pleural effusion. Electronically Signed   By: Andreas Newport M.D.   On: 03/27/2016 01:14    Critical care: 120 minutes  Procedures  INITIAL IMPRESSION / ASSESSMENT AND PLAN / ED COURSE  Pertinent labs & imaging results that were available during my care of the patient were reviewed by me and considered in my medical decision making (see chart for details).  The patient's presentation large volume blood loss per rectum patient hypotensive tachycardic suspecting hemorrhagic shock. As such patient was given initially 2 units of uncrossed match blood with improvement of blood pressure however patient continued to have large volume bright red blood per rectum while in the emergency department. In addition patient was given FFP and vitamin K. Patient required an additional 2 units of uncrossed much blood secondary to hypotension and persistent tachycardia with ongoing blood loss. Patient is  initial hemoglobin on arrival 7.7 after receiving 2 units of packed red blood cells patient's hemoglobin was 7.6. Patient discussed with Dr.Diep intensivist on call at the [facility] who accepted the patient in transfer to the ICU there.      ____________________________________________  FINAL CLINICAL IMPRESSION(S) / ED DIAGNOSES  Final diagnoses:  Lower GI bleed  Hypovolemic shock ([place])     MEDICATIONS GIVEN DURING THIS VISIT:  Medications  0.9 %  sodium chloride infusion (not administered)  iopamidol (ISOVUE-300) 61 % injection 30 mL (15 mLs Oral Contrast Given 03/27/16 0003)  0.9 %  sodium chloride infusion (not administered)  LORazepam (ATIVAN) 2 MG/ML injection (not administered)  ondansetron (ZOFRAN) 4 MG/2ML injection (not administered)  morphine 2 MG/ML injection (not administered)  0.9 %  sodium chloride infusion (not administered)  0.9 %  sodium chloride infusion ( Intravenous Stopped 03/27/16 0014)  morphine 2 MG/ML injection 2  mg (2 mg Intravenous Given 03/27/16 0040)  ondansetron (ZOFRAN) injection 4 mg (4 mg Intravenous Given 03/27/16 0041)  phytonadione (VITAMIN K) 10 mg in dextrose 5 % 50 mL IVPB (0 mg Intravenous Stopped 03/27/16 0435)  HYDROmorphone (DILAUDID) injection 0.5 mg (0.5 mg Intravenous Given 03/27/16 0244)     NEW OUTPATIENT MEDICATIONS STARTED DURING THIS VISIT:  New Prescriptions   No medications on file    Modified Medications   No medications on file    Discontinued Medications   No medications on file     Note:  This document was prepared using Dragon voice recognition software and may include unintentional dictation errors.    Gregor Hams, MD 03/27/16 [phone]

## 2016-03-27 NOTE — ED Notes (Addendum)
Second FFP started at 0353. Unit # [id number]  Component: thawed plasma Exp date 04/01/16 @ 2009 Volume 236mL verified by Sharen Hones. Started at 154ml/hr

## 2016-03-27 NOTE — ED Notes (Signed)
First FFP complete at 0345. Vitals in flow sheet

## 2016-03-27 NOTE — ED Notes (Addendum)
1 Unit of FFP verified with [facility] Unit #: [id number]

## 2016-03-27 NOTE — ED Notes (Signed)
Spoke with Abigail Butts at [facility] and gave update that patient is in route with 2 units of blood transfusing.

## 2016-03-27 NOTE — ED Notes (Signed)
2nd unit of FFP complete.

## 2016-03-27 NOTE — ED Notes (Signed)
4th unit of blood given. Unit: [id number] Component: [id number] Exp 04/11/16 Volume 329mL  Verified by Roswell Miners RN Rate: 143ml/hr  This unit went with [facility] to [facility] prior to 15 minute post infusion vitals done.

## 2016-03-27 NOTE — ED Notes (Signed)
This nurse and [facility] personnel changed pt. Pt had a large amount of dark red blood and clots from rectum. Pt able to help turn from side to side to clean pt.

## 2016-03-27 NOTE — ED Notes (Addendum)
2nd unit of blood started in the right hand per EDP at 17ml/hour.  Blood pressure prior to starting 100/52.   Unit [id number] L.  Component red cells, LR Volume 314mL Exp date 04/11/16 @ 2359

## 2016-03-27 NOTE — ED Notes (Signed)
Called blood bank, unable to scan blood in. Per blood bank enter numbers manually. Tried that was well and it is not recognizing. Blood bank aware, will enter it as blank note.

## 2016-03-27 NOTE — ED Notes (Signed)
Patient reporting needing to be cleaned. This Rn, Marissa Calamity Rn, and charge nurse at bed side cleaning patient. Patient had large amount of dark red blood with large clots. While cleaning patient started to go again with blood clots out of rectum.

## 2016-03-28 LAB — TYPE AND SCREEN
ABO/RH(D): O POS
Antibody Screen: POSITIVE
DAT, IgG: POSITIVE
DAT, complement: POSITIVE
DONOR AG TYPE: NEGATIVE
DONOR AG TYPE: NEGATIVE
UNIT DIVISION: 0
Unit division: 0
Unit division: 0
Unit division: 0

## 2016-03-28 LAB — BPAM FFP
BLOOD PRODUCT EXPIRATION DATE: 201803132359
Blood Product Expiration Date: 201803132359
ISSUE DATE / TIME: 201803080138
ISSUE DATE / TIME: 201803080322
Unit Type and Rh: 5100
Unit Type and Rh: 9500

## 2016-03-28 LAB — BPAM RBC
BLOOD PRODUCT EXPIRATION DATE: 201803212359
BLOOD PRODUCT EXPIRATION DATE: 201803232359
Blood Product Expiration Date: 201803222359
Blood Product Expiration Date: 201803232359
ISSUE DATE / TIME: 201803080010
ISSUE DATE / TIME: 201803080010
ISSUE DATE / TIME: 201803080340
ISSUE DATE / TIME: 201803080340
UNIT TYPE AND RH: 9500
UNIT TYPE AND RH: 9500
UNIT TYPE AND RH: 9500
Unit Type and Rh: 9500

## 2016-03-28 LAB — PREPARE FRESH FROZEN PLASMA
UNIT DIVISION: 0
Unit division: 0

## 2017-02-27 ENCOUNTER — Encounter: Payer: Self-pay | Admitting: Podiatry

## 2017-02-27 ENCOUNTER — Ambulatory Visit (INDEPENDENT_AMBULATORY_CARE_PROVIDER_SITE_OTHER): Payer: Non-veteran care | Admitting: Podiatry

## 2017-02-27 ENCOUNTER — Ambulatory Visit: Payer: Non-veteran care

## 2017-02-27 VITALS — BP 125/71 | HR 74 | Resp 16

## 2017-02-27 DIAGNOSIS — Z992 Dependence on renal dialysis: Secondary | ICD-10-CM

## 2017-02-27 DIAGNOSIS — M2042 Other hammer toe(s) (acquired), left foot: Secondary | ICD-10-CM

## 2017-02-27 DIAGNOSIS — N186 End stage renal disease: Secondary | ICD-10-CM | POA: Diagnosis not present

## 2017-02-27 DIAGNOSIS — Q828 Other specified congenital malformations of skin: Secondary | ICD-10-CM

## 2017-02-27 DIAGNOSIS — M2041 Other hammer toe(s) (acquired), right foot: Secondary | ICD-10-CM

## 2017-02-27 DIAGNOSIS — B351 Tinea unguium: Secondary | ICD-10-CM | POA: Diagnosis not present

## 2017-02-27 NOTE — Progress Notes (Signed)
  Subjective:  Patient ID: [name], male    DOB: 1970-04-19,  MRN: [id number]  Chief Complaint  Patient presents with  . Foot Pain    Patient states multiple issues today - sub 5th MPJ left, callused area, real tender, feet swell from dialysis, corns on 2nd toe right and 1st left, toenails thick and long, trouble cutting   47 y.o. male presents with the above complaint.  Past Medical History:  Diagnosis Date  . Anemia   . Collagen vascular disease ([place])   . Hypertension   . Lupus   . Renal disorder   . Renal insufficiency   . Renal transplant recipient    Past Surgical History:  Procedure Laterality Date  . NEPHRECTOMY TRANSPLANTED ORGAN      Current Outpatient Medications:  .  ferrous sulfate 324 (65 FE) MG TBEC, Take 1 tablet (325 mg total) by mouth 2 (two) times daily., Disp: 30 tablet, Rfl: 0 .  hydroxychloroquine (PLAQUENIL) 200 MG tablet, Take 200 mg by mouth 2 (two) times daily., Disp: , Rfl:  .  pantoprazole (PROTONIX) 40 MG tablet, Take 1 tablet (40 mg total) by mouth 2 (two) times daily., Disp: 60 tablet, Rfl: 0 .  PREDNISONE PO, Take 15 mg by mouth daily., Disp: , Rfl:  .  sildenafil (VIAGRA) 50 MG tablet, Take 25 mg by mouth., Disp: , Rfl:  .  tamsulosin (FLOMAX) 0.4 MG CAPS capsule, Take 0.8 mg by mouth at bedtime., Disp: , Rfl:   Allergies  Allergen Reactions  . Ciprofloxacin   . Sulfa Antibiotics    Review of Systems  Constitutional: Positive for fatigue.  Cardiovascular: Positive for leg swelling.  Musculoskeletal: Positive for gait problem.  All other systems reviewed and are negative.  Objective:   Vitals:   02/27/17 1024  BP: 125/71  Pulse: 74  Resp: 16   General AA&O x3. Normal mood and affect.  Vascular Dorsalis pedis and posterior tibial pulses  present 1+ bilaterally  Capillary refill normal to all digits. Pedal hair growth diminished.  Neurologic Epicritic sensation grossly present.  Dermatologic No open lesions. Interspaces clear  of maceration. Nails long and thickened with chronic texture subungual debris Left fifth MPJ implant  Orthopedic: MMT 5/5 in dorsiflexion, plantarflexion, inversion, and eversion. Normal joint ROM without pain or crepitus.   Assessment & Plan:  Patient was evaluated and treated and all questions answered.  Onychomycosis -Nails debrided due to chronic kidney disease   Procedure: Nail Debridement Rationale: Patient meets criteria for routine foot care due to CKD Type of Debridement: manual, sharp debridement. Instrumentation: Nail nipper, rotary burr. Number of Nails: 10  Left MPJ callus -Pared as below  Procedure: Paring of Lesion Rationale: painful hyperkeratotic lesion Type of Debridement: manual, sharp debridement. Instrumentation: 312 blade Number of Lesions: 1

## 2017-03-27 ENCOUNTER — Ambulatory Visit (INDEPENDENT_AMBULATORY_CARE_PROVIDER_SITE_OTHER): Payer: Non-veteran care | Admitting: Podiatry

## 2017-03-27 DIAGNOSIS — Q828 Other specified congenital malformations of skin: Secondary | ICD-10-CM

## 2017-03-27 DIAGNOSIS — M779 Enthesopathy, unspecified: Secondary | ICD-10-CM

## 2017-03-31 NOTE — Progress Notes (Signed)
  Subjective:  Patient ID: [name], male    DOB: Oct 26, 1970,  MRN: [id number]  Chief Complaint  Patient presents with  . Callouses    F/U corn trimming Pt. stated," they're about the same as last time." Tx: epsom salt soaking and trimming   47 y.o. male returns for the above complaint.  Reports that the calluses are about the same as last time.  Request Vicodin for pain from the calluses.  Has tried Epsom salts soaking and trimming the calluses himself  Objective:  There were no vitals filed for this visit. General AA&O x3. Normal mood and affect.  Vascular Pedal pulses palpable.  Neurologic Epicritic sensation grossly intact.  Dermatologic Nails of adequate length.  First MPJ callus without skin breakdown left foot  Orthopedic: No pain to palpation either foot.   Assessment & Plan:  Patient was evaluated and treated and all questions answered.  Left first MPJ callus -Courtesy debridement today.  Educated on self-care. -Discussed that opioid pain meds not indicated for this condition.  Return if symptoms worsen or fail to improve.

## 2017-06-03 ENCOUNTER — Telehealth: Payer: Self-pay | Admitting: Podiatry

## 2017-06-03 NOTE — Telephone Encounter (Signed)
This is [place] with [facility] calling to request office visit notes from date of service 27 February 2017. Please fax those to my attention at [phone]. Thank you.

## 2017-07-01 ENCOUNTER — Inpatient Hospital Stay
Admission: EM | Admit: 2017-07-01 | Discharge: 2017-07-03 | DRG: 391 | Disposition: A | Discharge status: Home / Self Care | Payer: Non-veteran care | Attending: Internal Medicine | Admitting: Internal Medicine

## 2017-07-01 ENCOUNTER — Emergency Department: Payer: Non-veteran care

## 2017-07-01 ENCOUNTER — Encounter: Payer: Self-pay | Admitting: *Deleted

## 2017-07-01 ENCOUNTER — Other Ambulatory Visit: Payer: Self-pay

## 2017-07-01 DIAGNOSIS — T8612 Kidney transplant failure: Secondary | ICD-10-CM | POA: Diagnosis present

## 2017-07-01 DIAGNOSIS — Y830 Surgical operation with transplant of whole organ as the cause of abnormal reaction of the patient, or of later complication, without mention of misadventure at the time of the procedure: Secondary | ICD-10-CM | POA: Diagnosis present

## 2017-07-01 DIAGNOSIS — Z79899 Other long term (current) drug therapy: Secondary | ICD-10-CM

## 2017-07-01 DIAGNOSIS — K5732 Diverticulitis of large intestine without perforation or abscess without bleeding: Principal | ICD-10-CM | POA: Diagnosis present

## 2017-07-01 DIAGNOSIS — I5032 Chronic diastolic (congestive) heart failure: Secondary | ICD-10-CM | POA: Diagnosis present

## 2017-07-01 DIAGNOSIS — Z8249 Family history of ischemic heart disease and other diseases of the circulatory system: Secondary | ICD-10-CM

## 2017-07-01 DIAGNOSIS — J9610 Chronic respiratory failure, unspecified whether with hypoxia or hypercapnia: Secondary | ICD-10-CM | POA: Diagnosis present

## 2017-07-01 DIAGNOSIS — Z808 Family history of malignant neoplasm of other organs or systems: Secondary | ICD-10-CM

## 2017-07-01 DIAGNOSIS — N186 End stage renal disease: Secondary | ICD-10-CM | POA: Diagnosis present

## 2017-07-01 DIAGNOSIS — Z882 Allergy status to sulfonamides status: Secondary | ICD-10-CM

## 2017-07-01 DIAGNOSIS — K5792 Diverticulitis of intestine, part unspecified, without perforation or abscess without bleeding: Secondary | ICD-10-CM | POA: Diagnosis present

## 2017-07-01 DIAGNOSIS — Z881 Allergy status to other antibiotic agents status: Secondary | ICD-10-CM

## 2017-07-01 DIAGNOSIS — N2581 Secondary hyperparathyroidism of renal origin: Secondary | ICD-10-CM | POA: Diagnosis present

## 2017-07-01 DIAGNOSIS — Z992 Dependence on renal dialysis: Secondary | ICD-10-CM | POA: Diagnosis not present

## 2017-07-01 DIAGNOSIS — E162 Hypoglycemia, unspecified: Secondary | ICD-10-CM | POA: Diagnosis present

## 2017-07-01 DIAGNOSIS — Z716 Tobacco abuse counseling: Secondary | ICD-10-CM | POA: Diagnosis not present

## 2017-07-01 DIAGNOSIS — N2889 Other specified disorders of kidney and ureter: Secondary | ICD-10-CM | POA: Diagnosis present

## 2017-07-01 DIAGNOSIS — A419 Sepsis, unspecified organism: Secondary | ICD-10-CM

## 2017-07-01 DIAGNOSIS — R1032 Left lower quadrant pain: Secondary | ICD-10-CM | POA: Diagnosis present

## 2017-07-01 DIAGNOSIS — R161 Splenomegaly, not elsewhere classified: Secondary | ICD-10-CM | POA: Diagnosis present

## 2017-07-01 DIAGNOSIS — D696 Thrombocytopenia, unspecified: Secondary | ICD-10-CM | POA: Diagnosis present

## 2017-07-01 DIAGNOSIS — D631 Anemia in chronic kidney disease: Secondary | ICD-10-CM | POA: Diagnosis present

## 2017-07-01 DIAGNOSIS — Z7952 Long term (current) use of systemic steroids: Secondary | ICD-10-CM | POA: Diagnosis not present

## 2017-07-01 DIAGNOSIS — I4510 Unspecified right bundle-branch block: Secondary | ICD-10-CM | POA: Diagnosis present

## 2017-07-01 DIAGNOSIS — I132 Hypertensive heart and chronic kidney disease with heart failure and with stage 5 chronic kidney disease, or end stage renal disease: Secondary | ICD-10-CM | POA: Diagnosis present

## 2017-07-01 DIAGNOSIS — F17211 Nicotine dependence, cigarettes, in remission: Secondary | ICD-10-CM | POA: Diagnosis present

## 2017-07-01 DIAGNOSIS — M3214 Glomerular disease in systemic lupus erythematosus: Secondary | ICD-10-CM | POA: Diagnosis present

## 2017-07-01 LAB — COMPREHENSIVE METABOLIC PANEL
ALK PHOS: 199 U/L — AB (ref 38–126)
ALT: 15 U/L — AB (ref 17–63)
AST: 31 U/L (ref 15–41)
Albumin: 2.8 g/dL — ABNORMAL LOW (ref 3.5–5.0)
Anion gap: 14 (ref 5–15)
BILIRUBIN TOTAL: 6.6 mg/dL — AB (ref 0.3–1.2)
BUN: 32 mg/dL — ABNORMAL HIGH (ref 6–20)
CALCIUM: 9.7 mg/dL (ref 8.9–10.3)
CO2: 27 mmol/L (ref 22–32)
CREATININE: 7.97 mg/dL — AB (ref 0.61–1.24)
Chloride: 97 mmol/L — ABNORMAL LOW (ref 101–111)
GFR, EST AFRICAN AMERICAN: 8 mL/min — AB (ref >60)
GFR, EST NON AFRICAN AMERICAN: 7 mL/min — AB (ref >60)
Glucose, Bld: 73 mg/dL (ref 65–99)
Potassium: 4.1 mmol/L (ref 3.5–5.1)
Sodium: 138 mmol/L (ref 135–145)
Total Protein: 7.1 g/dL (ref 6.5–8.1)

## 2017-07-01 LAB — CBC WITH DIFFERENTIAL/PLATELET
BASOS PCT: 1 %
Basophils Absolute: 0 10*3/uL (ref 0–0.1)
EOS ABS: 0.4 10*3/uL (ref 0–0.7)
Eosinophils Relative: 4 %
HCT: 26.3 % — ABNORMAL LOW (ref 40.0–52.0)
HEMOGLOBIN: 8.8 g/dL — AB (ref 13.0–18.0)
Lymphocytes Relative: 8 %
Lymphs Abs: 0.7 10*3/uL — ABNORMAL LOW (ref 1.0–3.6)
MCH: 31.4 pg (ref 26.0–34.0)
MCHC: 33.5 g/dL (ref 32.0–36.0)
MCV: 93.7 fL (ref 80.0–100.0)
MONOS PCT: 15 %
Monocytes Absolute: 1.3 10*3/uL — ABNORMAL HIGH (ref 0.2–1.0)
NEUTROS PCT: 72 %
Neutro Abs: 6.4 10*3/uL (ref 1.4–6.5)
Platelets: 90 10*3/uL — ABNORMAL LOW (ref 150–440)
RBC: 2.81 MIL/uL — ABNORMAL LOW (ref 4.40–5.90)
RDW: 16.2 % — AB (ref 11.5–14.5)
WBC: 8.8 10*3/uL (ref 3.8–10.6)

## 2017-07-01 LAB — LIPASE, BLOOD: Lipase: 19 U/L (ref 11–51)

## 2017-07-01 LAB — GLUCOSE, CAPILLARY
GLUCOSE-CAPILLARY: 82 mg/dL (ref 65–99)
Glucose-Capillary: 63 mg/dL — ABNORMAL LOW (ref 65–99)
Glucose-Capillary: 71 mg/dL (ref 65–99)

## 2017-07-01 LAB — LACTIC ACID, PLASMA
LACTIC ACID, VENOUS: 1.3 mmol/L (ref 0.5–1.9)
LACTIC ACID, VENOUS: 1 mmol/L (ref 0.5–1.9)

## 2017-07-01 LAB — MRSA PCR SCREENING: MRSA by PCR: POSITIVE — AB

## 2017-07-01 MED ORDER — ONDANSETRON HCL 4 MG/2ML IJ SOLN
4.0000 mg | Freq: Four times a day (QID) | INTRAMUSCULAR | Status: DC | PRN
  Administered 2017-07-02 – 2017-07-03 (×2): 4 mg via INTRAVENOUS
  Filled 2017-07-01 (×2): qty 2

## 2017-07-01 MED ORDER — POLYETHYLENE GLYCOL 3350 17 G PO PACK: 17.0000 g | PACK | Freq: Every day | ORAL | Status: DC | PRN

## 2017-07-01 MED ORDER — FENTANYL CITRATE (PF) 100 MCG/2ML IJ SOLN
50.0000 ug | Freq: Once | INTRAMUSCULAR | Status: AC
  Administered 2017-07-01: 50 ug via INTRAVENOUS

## 2017-07-01 MED ORDER — CHLORHEXIDINE GLUCONATE CLOTH 2 % EX PADS
6.0000 | MEDICATED_PAD | Freq: Every day | CUTANEOUS | Status: DC
  Administered 2017-07-01 – 2017-07-03 (×3): 6 via TOPICAL

## 2017-07-01 MED ORDER — DEXTROSE 50 % IV SOLN
0.5000 | Freq: Once | INTRAVENOUS | Status: AC
  Administered 2017-07-01: 25 mL via INTRAVENOUS

## 2017-07-01 MED ORDER — PIPERACILLIN-TAZOBACTAM 3.375 G IVPB
3.3750 g | Freq: Two times a day (BID) | INTRAVENOUS | Status: DC
  Administered 2017-07-01 – 2017-07-03 (×4): 3.375 g via INTRAVENOUS
  Filled 2017-07-01 (×4): qty 50

## 2017-07-01 MED ORDER — DEXTROSE 50 % IV SOLN
INTRAVENOUS | Status: AC
  Administered 2017-07-01: 25 mL via INTRAVENOUS
  Filled 2017-07-01: qty 50

## 2017-07-01 MED ORDER — SALINE SPRAY 0.65 % NA SOLN
1.0000 | NASAL | Status: DC | PRN
  Filled 2017-07-01: qty 44

## 2017-07-01 MED ORDER — ONDANSETRON HCL 4 MG/2ML IJ SOLN
4.0000 mg | Freq: Once | INTRAMUSCULAR | Status: AC
Start: 2017-07-01 — End: 2017-07-01
  Administered 2017-07-01: 4 mg via INTRAVENOUS

## 2017-07-01 MED ORDER — ONDANSETRON HCL 4 MG PO TABS: 4.0000 mg | ORAL_TABLET | Freq: Four times a day (QID) | ORAL | Status: DC | PRN

## 2017-07-01 MED ORDER — PIPERACILLIN-TAZOBACTAM 3.375 G IVPB 30 MIN
3.3750 g | Freq: Once | INTRAVENOUS | Status: AC
Start: 2017-07-01 — End: 2017-07-01
  Administered 2017-07-01: 3.375 g via INTRAVENOUS
  Filled 2017-07-01: qty 50

## 2017-07-01 MED ORDER — HYDROCODONE-ACETAMINOPHEN 5-325 MG PO TABS
1.0000 | ORAL_TABLET | ORAL | Status: DC | PRN
  Administered 2017-07-01: 2 via ORAL
  Administered 2017-07-01: 1 via ORAL
  Administered 2017-07-01 – 2017-07-03 (×9): 2 via ORAL
  Filled 2017-07-01 (×3): qty 2
  Filled 2017-07-01: qty 1
  Filled 2017-07-01 (×7): qty 2

## 2017-07-01 MED ORDER — DEXTROSE 50 % IV SOLN
25.0000 mL | Freq: Once | INTRAVENOUS | Status: AC
  Administered 2017-07-01: 05:00:00 via INTRAVENOUS

## 2017-07-01 MED ORDER — FENTANYL CITRATE (PF) 100 MCG/2ML IJ SOLN
INTRAMUSCULAR | Status: AC
  Filled 2017-07-01: qty 2

## 2017-07-01 MED ORDER — HYDROMORPHONE HCL 1 MG/ML IJ SOLN
0.5000 mg | INTRAMUSCULAR | Status: DC | PRN
  Administered 2017-07-01 – 2017-07-02 (×4): 0.5 mg via INTRAVENOUS
  Filled 2017-07-01: qty 0.5
  Filled 2017-07-01 (×2): qty 1
  Filled 2017-07-01: qty 0.5

## 2017-07-01 MED ORDER — PREDNISONE 10 MG PO TABS
10.0000 mg | ORAL_TABLET | Freq: Every day | ORAL | Status: DC
  Administered 2017-07-01 – 2017-07-03 (×3): 10 mg via ORAL
  Filled 2017-07-01 (×5): qty 1

## 2017-07-01 MED ORDER — IOPAMIDOL (ISOVUE-300) INJECTION 61%
15.0000 mL | INTRAVENOUS | Status: AC
  Administered 2017-07-01: 15 mL via ORAL

## 2017-07-01 MED ORDER — MUPIROCIN 2 % EX OINT
1.0000 "application " | TOPICAL_OINTMENT | Freq: Two times a day (BID) | CUTANEOUS | Status: DC
  Administered 2017-07-01 – 2017-07-02 (×4): 1 via NASAL
  Filled 2017-07-01: qty 22

## 2017-07-01 MED ORDER — ONDANSETRON HCL 4 MG/2ML IJ SOLN
INTRAMUSCULAR | Status: AC
  Filled 2017-07-01: qty 2

## 2017-07-01 MED ORDER — ACETAMINOPHEN 650 MG RE SUPP: 650.0000 mg | Freq: Four times a day (QID) | RECTAL | Status: DC | PRN

## 2017-07-01 MED ORDER — HEPARIN SODIUM (PORCINE) 5000 UNIT/ML IJ SOLN
5000.0000 [IU] | Freq: Three times a day (TID) | INTRAMUSCULAR | Status: DC
  Filled 2017-07-01: qty 1

## 2017-07-01 MED ORDER — ACETAMINOPHEN 325 MG PO TABS: 650.0000 mg | ORAL_TABLET | Freq: Four times a day (QID) | ORAL | Status: DC | PRN

## 2017-07-01 MED ORDER — PANTOPRAZOLE SODIUM 40 MG PO TBEC
40.0000 mg | DELAYED_RELEASE_TABLET | Freq: Two times a day (BID) | ORAL | Status: DC
  Administered 2017-07-01 – 2017-07-02 (×4): 40 mg via ORAL
  Filled 2017-07-01 (×4): qty 1

## 2017-07-01 MED ORDER — MORPHINE SULFATE (PF) 2 MG/ML IV SOLN
2.0000 mg | INTRAVENOUS | Status: DC | PRN
  Administered 2017-07-01: 2 mg via INTRAVENOUS
  Filled 2017-07-01: qty 1

## 2017-07-01 MED ORDER — EPOETIN ALFA 10000 UNIT/ML IJ SOLN
4000.0000 [IU] | INTRAMUSCULAR | Status: DC
  Administered 2017-07-01 – 2017-07-03 (×2): 4000 [IU] via INTRAVENOUS
  Filled 2017-07-01 (×2): qty 0.4

## 2017-07-01 MED ORDER — ENOXAPARIN SODIUM 30 MG/0.3ML ~~LOC~~ SOLN: 30.0000 mg | SUBCUTANEOUS | Status: DC

## 2017-07-01 MED ORDER — TAMSULOSIN HCL 0.4 MG PO CAPS
0.8000 mg | ORAL_CAPSULE | Freq: Every day | ORAL | Status: DC
  Administered 2017-07-02 (×2): 0.8 mg via ORAL
  Filled 2017-07-01 (×2): qty 2

## 2017-07-01 NOTE — Progress Notes (Signed)
HD tx start    07/01/17 2015  Vital Signs  Pulse Rate 76  Pulse Rate Source Monitor  Resp 17  BP 106/60  BP Location Right Arm  BP Method Automatic  Patient Position (if appropriate) Lying  Oxygen Therapy  SpO2 100 %  O2 Device Nasal Cannula  O2 Flow Rate (L/min) 4 L/min  During Hemodialysis Assessment  Blood Flow Rate (mL/min) 400 mL/min  Arterial Pressure (mmHg) -180 mmHg  Venous Pressure (mmHg) 150 mmHg  Transmembrane Pressure (mmHg) 70 mmHg  Ultrafiltration Rate (mL/min) 330 mL/min  Dialysate Flow Rate (mL/min) 800 ml/min  Conductivity: Machine  14.1  HD Safety Checks Performed Yes  Dialysis Fluid Bolus Normal Saline  Bolus Amount (mL) 250 mL  Intra-Hemodialysis Comments Tx initiated

## 2017-07-01 NOTE — Progress Notes (Signed)
Pre HD assessment    07/01/17 2006  Vital Signs  Temp 98.4 F (36.9 C)  Temp Source Oral  Pulse Rate 76  Pulse Rate Source Monitor  Resp 13  BP (!) 102/55  BP Location Right Arm  BP Method Automatic  Patient Position (if appropriate) Lying  Oxygen Therapy  SpO2 100 %  O2 Device Nasal Cannula  O2 Flow Rate (L/min) 4 L/min  Pain Assessment  Pain Scale 0-10  Pain Score 0  Dialysis Weight  Weight 92.4 kg (203 lb 11.3 oz)  Type of Weight Pre-Dialysis  Time-Out for Hemodialysis  What Procedure? HD  Pt Identifiers(min of two) First/Last Name;MRN/Account#  Correct Site? Yes  Correct Side? Yes  Correct Procedure? Yes  Consents Verified? Yes  Rad Studies Available? N/A  Safety Precautions Reviewed? Yes  Engineer, civil (consulting) Number  (6A)  Station Number 4  UF/Alarm Test Passed  Conductivity: Meter 13.8  Conductivity: Machine  14.1  pH 7.6  Reverse Osmosis main  Normal Saline Lot Number [id number]  Dialyzer Lot Number [id number]  Disposable Set Lot Number [id number]  Machine Temperature 98.6 F (37 C)  Musician and Audible Yes  Blood Lines Intact and Secured Yes  Pre Treatment Patient Checks  Vascular access used during treatment Fistula  Hepatitis B Surface Antigen Results Negative  Date Hepatitis B Surface Antigen Drawn 06/29/17  Hepatitis B Surface Antibody  (>10)  Date Hepatitis B Surface Antibody Drawn 03/05/17  Hemodialysis Consent Verified Yes  Hemodialysis Standing Orders Initiated Yes  ECG (Telemetry) Monitor On Yes  Prime Ordered Normal Saline  Length of  DialysisTreatment -hour(s) 3 Hour(s)  Dialyzer Elisio 17H NR  Dialysate 3K, 2.5 Ca  Dialysis Anticoagulant None  Dialysate Flow Ordered 800  Blood Flow Rate Ordered 400 mL/min  Ultrafiltration Goal 0.5 Liters  Dialysis Blood Pressure Support Ordered Normal Saline  Education / Care Plan  Dialysis Education Provided Yes  Documented Education in Care Plan Yes

## 2017-07-01 NOTE — Progress Notes (Signed)
Pharmacy Antibiotic Note  [name] is a 47 y.o. male admitted on 07/01/2017 with sepsis and Intra-abdominal infection.  Pharmacy has been consulted for Zosyn dosing.  Plan: Crcl 13.2 ml/min. Patient received Zosyn 3.375 gm IV x 1 in ER. Will continue with EI Zosyn EI 3.375gm IV q12h per renal fxn   Height: 6\' 2"  (188 cm) Weight: 191 lb (86.6 kg) IBW/kg (Calculated) : 82.2  Temp (24hrs), Avg:98.1 F (36.7 C), Min:98 F (36.7 C), Max:98.2 F (36.8 C)  Recent Labs  Lab 07/01/17 0309 07/01/17 0310  WBC  --  8.8  CREATININE  --  7.97*  LATICACIDVEN 1.3  --     Estimated Creatinine Clearance: 13.2 mL/min (A) (by C-G formula based on SCr of 7.97 mg/dL (H)).    Allergies  Allergen Reactions  . Ciprofloxacin   . Sulfa Antibiotics     Antimicrobials this admission: Zosyn 6/12 >>       >>    Dose adjustments this admission:    Microbiology results: 6/12 BCx: pending   UCx:      Sputum:      MRSA PCR:    Thank you for allowing pharmacy to be a part of this patient's care.  Brynlie Daza A 07/01/2017 8:34 AM

## 2017-07-01 NOTE — H&P (Signed)
[facility] [place] NAME: [name]    MR#:  [id number]  DATE OF BIRTH:  09-05-1970  DATE OF ADMISSION:  07/01/2017  PRIMARY CARE PHYSICIAN: [facility]   REQUESTING/REFERRING PHYSICIAN: DR Owens Shark  CHIEF COMPLAINT:   Abdominal pain  HISTORY OF PRESENT ILLNESS:  [name]  is a 47 y.o. male with a known history of lupus and end-stage renal disease on hemodialysis who presents emergency room due to abdominal pain.  Patient arrived via EMS complaining of left lower quadrant abdominal pain associated with nausea and vomiting.  He denies fever.  CT scan shows acute diverticulitis.  He has not had a bout of diverticulitis in the past.  He denies hematochezia, dark-colored stools or hematemesis.  He was started on Zosyn in the emergency room. PAST MEDICAL HISTORY:   Past Medical History:  Diagnosis Date  . Anemia   . Collagen vascular disease ([place])   . Hypertension   . Lupus ([place])   . Renal disorder   . Renal insufficiency   . Renal transplant recipient     PAST SURGICAL HISTORY:   Past Surgical History:  Procedure Laterality Date  . NEPHRECTOMY TRANSPLANTED ORGAN      SOCIAL HISTORY:   Social History   Tobacco Use  . Smoking status: Former Smoker    Packs/day: 0.50  . Smokeless tobacco: Never Used  Substance Use Topics  . Alcohol use: No    FAMILY HISTORY:   Family History  Problem Relation Age of Onset  . Hypertension Other   . Brain cancer Mother   . Aneurysm Mother   . CAD Father     DRUG ALLERGIES:   Allergies  Allergen Reactions  . Ciprofloxacin   . Sulfa Antibiotics     REVIEW OF SYSTEMS:   Review of Systems  Constitutional: Negative.  Negative for chills, fever and malaise/fatigue.  HENT: Negative.  Negative for ear discharge, ear pain, hearing loss, nosebleeds and sore throat.   Eyes: Negative.  Negative for blurred vision and pain.  Respiratory: Negative.  Negative for cough,  hemoptysis, shortness of breath and wheezing.   Cardiovascular: Negative.  Negative for chest pain, palpitations and leg swelling.  Gastrointestinal: Positive for abdominal pain. Negative for blood in stool, diarrhea, nausea and vomiting.  Genitourinary: Negative.  Negative for dysuria.  Musculoskeletal: Negative.  Negative for back pain.  Skin: Negative.   Neurological: Negative for dizziness, tremors, speech change, focal weakness, seizures and headaches.  Endo/Heme/Allergies: Negative.  Does not bruise/bleed easily.  Psychiatric/Behavioral: Negative.  Negative for depression, hallucinations and suicidal ideas.    MEDICATIONS AT HOME:   Prior to Admission medications   Medication Sig Start Date End Date Taking? Authorizing Provider  ferrous sulfate 324 (65 FE) MG TBEC Take 1 tablet (325 mg total) by mouth 2 (two) times daily. 12/10/14   Vaughan Basta, MD  hydroxychloroquine (PLAQUENIL) 200 MG tablet Take 200 mg by mouth 2 (two) times daily.    [provider]  pantoprazole (PROTONIX) 40 MG tablet Take 1 tablet (40 mg total) by mouth 2 (two) times daily. 12/10/14   Vaughan Basta, MD  PREDNISONE PO Take 15 mg by mouth daily.    [provider]  sildenafil (VIAGRA) 50 MG tablet Take 25 mg by mouth.    [provider]  tamsulosin (FLOMAX) 0.4 MG CAPS capsule Take 0.8 mg by mouth at bedtime.    [provider]  VITAL SIGNS:  Blood pressure (!) 99/56, pulse 92, temperature 98 F (36.7 C), temperature source Oral, resp. rate 12, height 6\' 2"  (1.88 m), weight 86.6 kg (191 lb), SpO2 97 %.  PHYSICAL EXAMINATION:   Physical Exam  Constitutional: He is oriented to person, place, and time. No distress.  HENT:  Head: Normocephalic.  Eyes: No scleral icterus.  Neck: Normal range of motion. Neck supple. No JVD present. No tracheal deviation present.  Cardiovascular: Normal rate, regular rhythm and normal heart sounds. Exam reveals no  gallop and no friction rub.  No murmur heard. Pulmonary/Chest: Effort normal and breath sounds normal. No respiratory distress. He has no wheezes. He has no rales. He exhibits no tenderness.  Abdominal: Soft. Bowel sounds are normal. He exhibits no distension and no mass. There is tenderness. There is no rebound and no guarding.  Musculoskeletal: Normal range of motion. He exhibits no edema.  Neurological: He is alert and oriented to person, place, and time.  Skin: Skin is warm. No rash noted. No erythema.  Psychiatric: Judgment normal.      LABORATORY PANEL:   CBC Recent Labs  Lab 07/01/17 0310  WBC 8.8  HGB 8.8*  HCT 26.3*  PLT 90*   ------------------------------------------------------------------------------------------------------------------  Chemistries  Recent Labs  Lab 07/01/17 0310  NA 138  K 4.1  CL 97*  CO2 27  GLUCOSE 73  BUN 32*  CREATININE 7.97*  CALCIUM 9.7  AST 31  ALT 15*  ALKPHOS 199*  BILITOT 6.6*   ------------------------------------------------------------------------------------------------------------------  Cardiac Enzymes No results for input(s): TROPONINI in the last 168 hours. ------------------------------------------------------------------------------------------------------------------  RADIOLOGY:  Ct Abdomen Pelvis Wo Contrast  Result Date: 07/01/2017 CLINICAL DATA:  Abdominal pain, weakness and decreased appetite. EXAM: CT ABDOMEN AND PELVIS WITHOUT CONTRAST TECHNIQUE: Multidetector CT imaging of the abdomen and pelvis was performed following the standard protocol without IV contrast. COMPARISON:  CT 03/27/2016 FINDINGS: Lower chest: Cardiomegaly with coronary artery calcifications. Chronic calcification adjacent to the right atrium. Chronic right lung base pleuroparenchymal scarring. Hepatobiliary: Prominent size liver spanning 21 cm cranial caudal. No discrete focal lesion allowing for lack contrast. Gallbladder physiologically  distended, no calcified stone. No biliary dilatation. Pancreas: No ductal dilatation or inflammation. Spleen: Progressive splenomegaly from prior exam, spleen measures 17 x 14.5 x 9.1 cm. No focal splenic lesion. Adrenals/Urinary Tract: No adrenal nodule. Atrophic native kidneys with vascular calcification versus nonobstructing stone in the lower right kidney. No hydronephrosis. Right lower quadrant renal transplant with progressive diffuse renal parenchymal calcifications from prior exam. No transplant hydronephrosis. Urinary bladder is partially distended without wall thickening. Stomach/Bowel: Enteric contrast in the distal esophagus. Stomach physiologically distended. No small bowel dilatation, inflammation or obstruction. Extensive colonic diverticulosis throughout the entire colon. Mild colonic wall thickening and pericolonic edema in the distal descending colon in the region of multiple diverticula suspicious for diverticulitis. Minimal adjacent free fluid in the left pericolic gutter. Edema tracks in the left retroperitoneum. No perforation or abscess. The appendix is tentatively but not confidently visualized. No secondary findings to suggest appendicitis. Vascular/Lymphatic: Dense aortic and branch atherosclerosis. Multiple small retroperitoneal lymph nodes. No pelvic or mesenteric adenopathy. Reproductive: Prostate is unremarkable. Other: Minimal free fluid in the left pericolic gutter. No ascites. No free air or intra-abdominal abscess. Small fat containing umbilical hernia. Subcutaneous edema and minimal subcutaneous fluid in the left gluteal crease without dominant fluid collection. No soft tissue air. Musculoskeletal: There are no acute or suspicious osseous abnormalities. Mild diffuse increased bone mineral density likely sequela of  renal osteodystrophy. Benign-appearing sclerotic density in the left iliac bone is unchanged. IMPRESSION: 1. Acute uncomplicated diverticulitis in the distal descending  colon. No perforation or abscess. 2. Extensive pan colonic diverticulosis. 3. Progressive splenomegaly over the past 15 months. 4. Minimal enteric contrast in the distal esophagus can be seen with reflux or delayed transit. 5. Increasing renal parenchymal calcifications of the right lower quadrant renal transplant since prior exam. No transplant hydronephrosis. 6. Edema and minimal subcutaneous fluid in the left gluteal crease is likely inflammatory without well-defined abscess. 7.  Aortic Atherosclerosis (ICD10-I70.0). Electronically Signed   By: Jeb Levering M.D.   On: 07/01/2017 06:16   Dg Chest Port 1 View  Result Date: 07/01/2017 CLINICAL DATA:  Hypoxia.  Weakness. EXAM: PORTABLE CHEST 1 VIEW COMPARISON:  Radiographs 03/27/2016 FINDINGS: Cardiomegaly which appears similar to prior exam. Mediastinal contours are unchanged. Diffuse peribronchial and interstitial opacities slightly more prominent on the left, increased. Blunting of the right costophrenic angle may be effusion or chronic, partially excluded from the field of view. No pneumothorax. IMPRESSION: CHF with cardiomegaly and interstitial edema. Possible small right pleural effusion versus chronic pleural thickening not entirely included in the field of view. Electronically Signed   By: Jeb Levering M.D.   On: 07/01/2017 03:27    EKG:   Normal sinus rhythm with right bundle blanch block no ST elevation or depression  IMPRESSION AND PLAN:   47 year old male with end-stage renal disease on hemodialysis and lupus on chronic steroids who presents with abdominal pain and found to have acute diverticulitis.  1.  Acute uncomplicated diverticulitis in the distal descending colon without perforation or abscess: Continue Zosyn Surgery consultation Pain medications and antiemetics for supportive management   2.  End-stage renal disease on hemodialysis: Nephrology consultation requested for dialysis  3.  Chronic respiratory failure from  end-stage renal disease and chronic diastolic heart failure  4.  History of lupus: Continue daily steroids Patient reports he is on chronic opiates.  We are attempting to obtain med list from the [place].  5.  Chronic anemia: Monitor hemoglobin  6.  Low platelets: Monitor platelet count 7. Tobacco dependence: Patient is encouraged to quit smoking. Counseling was provided for 4 minutes.      All the records are reviewed and case discussed with ED provider. Management plans discussed with the patient and he in agreement  CODE STATUS: full  TOTAL TIME TAKING CARE OF THIS PATIENT: 38 minutes.    Jamorris Ndiaye M.D on 07/01/2017 at 7:10 AM  Between 7am to 6pm - Pager - [phone]  After 6pm go to [email or web address] - password [email or web address] [facility]  Office  [phone]  CC: Primary care physician; System, Provider Not In

## 2017-07-01 NOTE — Care Management (Signed)
Call received from April at [place].  Per April clinical documentation is not indicated as they are on diversion.  Per April patient PCP is [facility].  Case worker Horald Chestnut [phone])

## 2017-07-01 NOTE — Consult Note (Signed)
Surgical Consultation   [name]  XBM:[id number]  DOB: [birth year]  DOA: 07/01/2017   Requesting physician: Hospitalist   Reason for consultation: Acute diverticulitis   History of Present Illness: [name] is an 47 y.o. male with a history of diverticular disease with a previous diverticular bleed, lupus, ESRD s/p renal transplant on HD, and anemia who presents with left lower quadrant abdominal pain and loss of appetite for 5 days associated with chills without fever. CT scan shows acute diverticulitis of the distal descending colon without evidence of perforation or abscess.    Review of Systems:  Review of Systems  Constitutional: Positive for chills, malaise/fatigue and weight loss. Negative for diaphoresis and fever.  HENT: Negative for congestion, hearing loss and sore throat.   Eyes: Negative for blurred vision and double vision.  Respiratory: Negative for cough, sputum production and shortness of breath.   Cardiovascular: Negative for chest pain and palpitations.  Gastrointestinal: Positive for abdominal pain, constipation, nausea and vomiting. Negative for blood in stool, diarrhea and melena.  Musculoskeletal: Negative for joint pain and myalgias.  Skin: Negative for itching and rash.  Neurological: Negative for dizziness, weakness and headaches.    Past Medical History: Past Medical History:  Diagnosis Date  . Anemia   . Collagen vascular disease ([place])   . Hypertension   . Lupus ([place])   . Renal disorder   . Renal insufficiency   . Renal transplant recipient     Past Surgical History: Past Surgical History:  Procedure Laterality Date  . NEPHRECTOMY TRANSPLANTED ORGAN      Allergies:   Allergies  Allergen Reactions  . Ciprofloxacin   . Sulfa Antibiotics     Social History:  reports that he has quit smoking. He smoked 0.50 packs per day. He has never used smokeless tobacco. He reports that he does not drink alcohol or use drugs.   Family  History: Family History  Problem Relation Age of Onset  . Hypertension Other   . Brain cancer Mother   . Aneurysm Mother   . CAD Father     Physical Exam: Vitals:   07/01/17 0730 07/01/17 0732 07/01/17 0751 07/01/17 0818  BP: 105/62 105/62 105/62 (!) 105/52  Pulse:   88 85  Resp: 11 11 20 12   Temp:    98.2 F (36.8 C)  TempSrc:    Oral  SpO2:  97% 100% 100%  Weight:      Height:        Constitutional: Alert and awake, oriented x3, not in any acute distress. Eyes: EOMI, irises appear normal, anicteric sclera ENMT: external ears and nose appear normal, Lips appears normal,  Neck: neck appears normal, trachea is midline CVS: RRR Respiratory:  Respiratory effort normal. No accessory muscle use.  Abdomen: soft, tender to palpation in LLQ, nondistended, normal bowel sounds, no rebound tenderness, guarding, or rigidity Musculoskeletal: no calf tenderness Neuro: Cranial nerves II-XII grossly intact, no focal deficits Psych: judgement and insight appear normal, stable mood and affect Skin: no rashes or lesions or ulcers    Data reviewed:  I have personally reviewed following labs and imaging studies Labs:  CBC: Recent Labs  Lab 07/01/17 0310  WBC 8.8  NEUTROABS 6.4  HGB 8.8*  HCT 26.3*  MCV 93.7  PLT 90*    Basic Metabolic Panel: Recent Labs  Lab 07/01/17 0310  NA 138  K 4.1  CL 97*  CO2 27  GLUCOSE 73  BUN 32*  CREATININE 7.97*  CALCIUM 9.7   Liver Function Tests: Recent Labs  Lab 07/01/17 0310  AST 31  ALT 15*  ALKPHOS 199*  BILITOT 6.6*  PROT 7.1  ALBUMIN 2.8*   Recent Labs  Lab 07/01/17 0310  LIPASE 19   CBG: Recent Labs  Lab 07/01/17 0255 07/01/17 0434  GLUCAP 63* 82    Inpatient Medications:   Scheduled Meds: . Chlorhexidine Gluconate Cloth  6 each Topical Q0600  . [START ON 07/03/2017] epoetin (EPOGEN/PROCRIT) injection  4,000 Units Intravenous Q M,W,F-HD  . heparin injection (subcutaneous)  5,000 Units Subcutaneous Q8H  .  mupirocin ointment  1 application Nasal BID  . pantoprazole  40 mg Oral BID  . predniSONE  10 mg Oral Q breakfast  . tamsulosin  0.8 mg Oral QHS   Continuous Infusions: . piperacillin-tazobactam (ZOSYN)  IV 3.375 g (07/01/17 1652)     Radiological Exams on Admission: Ct Abdomen Pelvis Wo Contrast  Result Date: 07/01/2017 CLINICAL DATA:  Abdominal pain, weakness and decreased appetite. EXAM: CT ABDOMEN AND PELVIS WITHOUT CONTRAST TECHNIQUE: Multidetector CT imaging of the abdomen and pelvis was performed following the standard protocol without IV contrast. COMPARISON:  CT 03/27/2016 FINDINGS: Lower chest: Cardiomegaly with coronary artery calcifications. Chronic calcification adjacent to the right atrium. Chronic right lung base pleuroparenchymal scarring. Hepatobiliary: Prominent size liver spanning 21 cm cranial caudal. No discrete focal lesion allowing for lack contrast. Gallbladder physiologically distended, no calcified stone. No biliary dilatation. Pancreas: No ductal dilatation or inflammation. Spleen: Progressive splenomegaly from prior exam, spleen measures 17 x 14.5 x 9.1 cm. No focal splenic lesion. Adrenals/Urinary Tract: No adrenal nodule. Atrophic native kidneys with vascular calcification versus nonobstructing stone in the lower right kidney. No hydronephrosis. Right lower quadrant renal transplant with progressive diffuse renal parenchymal calcifications from prior exam. No transplant hydronephrosis. Urinary bladder is partially distended without wall thickening. Stomach/Bowel: Enteric contrast in the distal esophagus. Stomach physiologically distended. No small bowel dilatation, inflammation or obstruction. Extensive colonic diverticulosis throughout the entire colon. Mild colonic wall thickening and pericolonic edema in the distal descending colon in the region of multiple diverticula suspicious for diverticulitis. Minimal adjacent free fluid in the left pericolic gutter. Edema tracks  in the left retroperitoneum. No perforation or abscess. The appendix is tentatively but not confidently visualized. No secondary findings to suggest appendicitis. Vascular/Lymphatic: Dense aortic and branch atherosclerosis. Multiple small retroperitoneal lymph nodes. No pelvic or mesenteric adenopathy. Reproductive: Prostate is unremarkable. Other: Minimal free fluid in the left pericolic gutter. No ascites. No free air or intra-abdominal abscess. Small fat containing umbilical hernia. Subcutaneous edema and minimal subcutaneous fluid in the left gluteal crease without dominant fluid collection. No soft tissue air. Musculoskeletal: There are no acute or suspicious osseous abnormalities. Mild diffuse increased bone mineral density likely sequela of renal osteodystrophy. Benign-appearing sclerotic density in the left iliac bone is unchanged. IMPRESSION: 1. Acute uncomplicated diverticulitis in the distal descending colon. No perforation or abscess. 2. Extensive pan colonic diverticulosis. 3. Progressive splenomegaly over the past 15 months. 4. Minimal enteric contrast in the distal esophagus can be seen with reflux or delayed transit. 5. Increasing renal parenchymal calcifications of the right lower quadrant renal transplant since prior exam. No transplant hydronephrosis. 6. Edema and minimal subcutaneous fluid in the left gluteal crease is likely inflammatory without well-defined abscess. 7.  Aortic Atherosclerosis (ICD10-I70.0). Electronically Signed   By: Jeb Levering M.D.   On: 07/01/2017 06:16   Dg Chest Port 1 View  Result Date: 07/01/2017 CLINICAL DATA:  Hypoxia.  Weakness. EXAM: PORTABLE CHEST 1 VIEW COMPARISON:  Radiographs 03/27/2016 FINDINGS: Cardiomegaly which appears similar to prior exam. Mediastinal contours are unchanged. Diffuse peribronchial and interstitial opacities slightly more prominent on the left, increased. Blunting of the right costophrenic angle may be effusion or chronic, partially  excluded from the field of view. No pneumothorax. IMPRESSION: CHF with cardiomegaly and interstitial edema. Possible small right pleural effusion versus chronic pleural thickening not entirely included in the field of view. Electronically Signed   By: Jeb Levering M.D.   On: 07/01/2017 03:27    Impression/Recommendations Active Problems:   Diverticulitis  The patient is a 47 year-old male with a history of diverticular disease with a previous diverticular bleed, lupus, ESRD s/p renal transplant on HD, and anemia who presents with acute diverticulitis of the distal descending colon without evidence of perforation or abscess.  - Continue IV antibiotic - Bowel rest - IVF hydration - No surgical intervention necessary at this time - Serial abdominal exams - Medical management per primary team and nephrology - DVT prophylaxis - Encourage ambulation   Thank you for this consultation.  We appreciate the opportunity to participate in the care of this patient.  TrippO.

## 2017-07-01 NOTE — ED Provider Notes (Signed)
[facility] Emergency Department Provider Note   First MD Initiated Contact with Patient 07/01/17 0244     (approximate)  I have reviewed the triage vital signs and the nursing notes.   HISTORY  Chief Complaint Weakness    HPI [name] is a 47 y.o. male below list of chronic medical conditions including lupus and diverticulosis presents to the emergency department with left lower quadrant abdominal pain nausea and loss of appetite x5 days.  Patient admits to chills however no fever.  Patient states that he was seen at the [facility] for the same with no diagnosis given.  Patient states that his "blood sugar keeps dropping"  Past Medical History:  Diagnosis Date  . Anemia   . Collagen vascular disease ([place])   . Hypertension   . Lupus ([place])   . Renal disorder   . Renal insufficiency   . Renal transplant recipient     Patient Active Problem List   Diagnosis Date Noted  . 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 At risk for sepsis 09/08/2015  . GI bleed 12/09/2014    Past Surgical History:  Procedure Laterality Date  . NEPHRECTOMY TRANSPLANTED ORGAN      Prior to Admission medications   Medication Sig Start Date End Date Taking? Authorizing Provider  ferrous sulfate 324 (65 FE) MG TBEC Take 1 tablet (325 mg total) by mouth 2 (two) times daily. 12/10/14   Vaughan Basta, MD  hydroxychloroquine (PLAQUENIL) 200 MG tablet Take 200 mg by mouth 2 (two) times daily.    [provider]  pantoprazole (PROTONIX) 40 MG tablet Take 1 tablet (40 mg total) by mouth 2 (two) times daily. 12/10/14   Vaughan Basta, MD  PREDNISONE PO Take 15 mg by mouth daily.    [provider]  sildenafil (VIAGRA) 50 MG tablet Take 25 mg by mouth.    [provider]  tamsulosin (FLOMAX) 0.4 MG CAPS capsule Take 0.8 mg by mouth at bedtime.    [provider]     Allergies Ciprofloxacin and Sulfa antibiotics  Family History  Problem Relation Age of Onset  . Hypertension Other   . Brain cancer Mother   . Aneurysm Mother   . CAD Father     Social History Social History   Tobacco Use  . Smoking status: Former Smoker    Packs/day: 0.50  . Smokeless tobacco: Never Used  Substance Use Topics  . Alcohol use: No  . Drug use: No    Review of Systems Constitutional: Positive for chills Eyes: No visual changes. ENT: No sore throat. Cardiovascular: Denies chest pain. Respiratory: Denies shortness of breath. Gastrointestinal: Positive for abdominal pain no vomiting.  No diarrhea.  No constipation. Genitourinary: Negative for dysuria. Musculoskeletal: Negative for neck pain.  Negative for back pain. Integumentary: Negative for rash. Neurological: Negative for headaches, focal weakness or numbness.   ____________________________________________   PHYSICAL EXAM:  VITAL SIGNS: ED Triage Vitals  Enc Vitals Group     BP 07/01/17 0300 (!) 90/46     Pulse Rate 07/01/17 0244 92     Resp 07/01/17 0244 12     Temp 07/01/17 0244 98 F (36.7 C)     Temp Source 07/01/17 0244 Oral     SpO2 07/01/17 0244 (!) 85 %     Weight 07/01/17 0247 86.6 kg (191 lb)     Height 07/01/17 0247 1.88 m ('6\' 2"'$ )  Head Circumference --      Peak Flow --      Pain Score 07/01/17 0244 7     Pain Loc --      Pain Edu? --      Excl. in [place]? --     Constitutional: Alert and oriented. Well appearing and in no acute distress. Eyes: Conjunctivae are normal.  Head: Atraumatic. Mouth/Throat: Mucous membranes are moist.  Oropharynx non-erythematous. Neck: No stridor.   Cardiovascular: Normal rate, regular rhythm. Good peripheral circulation. Grossly normal heart sounds. Respiratory: Normal respiratory effort.  No retractions. Lungs CTAB. Gastrointestinal: Left lower quadrant tenderness to palpation no distention.  Musculoskeletal: No lower extremity  tenderness nor edema. No gross deformities of extremities. Neurologic:  Normal speech and language. No gross focal neurologic deficits are appreciated.  Skin:  Skin is warm, dry and intact. No rash noted. Psychiatric: Mood and affect are normal. Speech and behavior are normal.  ____________________________________________   LABS (all labs ordered are listed, but only abnormal results are displayed)  Labs Reviewed  COMPREHENSIVE METABOLIC PANEL - Abnormal; Notable for the following components:      Result Value   Chloride 97 (*)    BUN 32 (*)    Creatinine, Ser 7.97 (*)    Albumin 2.8 (*)    ALT 15 (*)    Alkaline Phosphatase 199 (*)    Total Bilirubin 6.6 (*)    GFR calc non Af Amer 7 (*)    GFR calc Af Amer 8 (*)    All other components within normal limits  CBC WITH DIFFERENTIAL/PLATELET - Abnormal; Notable for the following components:   RBC 2.81 (*)    Hemoglobin 8.8 (*)    HCT 26.3 (*)    RDW 16.2 (*)    Platelets 90 (*)    Lymphs Abs 0.7 (*)    Monocytes Absolute 1.3 (*)    All other components within normal limits  GLUCOSE, CAPILLARY - Abnormal; Notable for the following components:   Glucose-Capillary 63 (*)    All other components within normal limits  CULTURE, BLOOD (ROUTINE X 2)  CULTURE, BLOOD (ROUTINE X 2)  LACTIC ACID, PLASMA  LIPASE, BLOOD  GLUCOSE, CAPILLARY  URINALYSIS, ROUTINE W REFLEX MICROSCOPIC  LACTIC ACID, PLASMA   ____________________________________________  EKG  ED ECG REPORT I, [place] N BROWN, the attending physician, personally viewed and interpreted this ECG.   Date: 07/01/2017  EKG Time: 2:44 AM  Rate: 92  Rhythm: Normal sinus rhythm with right bundle branch block  Axis: Right axis deviation  Intervals: Normal  ST&T Change: None  ____________________________________________  RADIOLOGY I, [place] N BROWN, personally viewed and evaluated these images (plain radiographs) as part of my medical decision making, as well as  reviewing the written report by the radiologist.  ED MD interpretation: Acute uncomplicated diverticulitis  Official radiology report(s): Ct Abdomen Pelvis Wo Contrast  Result Date: 07/01/2017 CLINICAL DATA:  Abdominal pain, weakness and decreased appetite. EXAM: CT ABDOMEN AND PELVIS WITHOUT CONTRAST TECHNIQUE: Multidetector CT imaging of the abdomen and pelvis was performed following the standard protocol without IV contrast. COMPARISON:  CT 03/27/2016 FINDINGS: Lower chest: Cardiomegaly with coronary artery calcifications. Chronic calcification adjacent to the right atrium. Chronic right lung base pleuroparenchymal scarring. Hepatobiliary: Prominent size liver spanning 21 cm cranial caudal. No discrete focal lesion allowing for lack contrast. Gallbladder physiologically distended, no calcified stone. No biliary dilatation. Pancreas: No ductal dilatation or inflammation. Spleen: Progressive splenomegaly from prior exam, spleen measures 17 x 14.5  x 9.1 cm. No focal splenic lesion. Adrenals/Urinary Tract: No adrenal nodule. Atrophic native kidneys with vascular calcification versus nonobstructing stone in the lower right kidney. No hydronephrosis. Right lower quadrant renal transplant with progressive diffuse renal parenchymal calcifications from prior exam. No transplant hydronephrosis. Urinary bladder is partially distended without wall thickening. Stomach/Bowel: Enteric contrast in the distal esophagus. Stomach physiologically distended. No small bowel dilatation, inflammation or obstruction. Extensive colonic diverticulosis throughout the entire colon. Mild colonic wall thickening and pericolonic edema in the distal descending colon in the region of multiple diverticula suspicious for diverticulitis. Minimal adjacent free fluid in the left pericolic gutter. Edema tracks in the left retroperitoneum. No perforation or abscess. The appendix is tentatively but not confidently visualized. No secondary findings  to suggest appendicitis. Vascular/Lymphatic: Dense aortic and branch atherosclerosis. Multiple small retroperitoneal lymph nodes. No pelvic or mesenteric adenopathy. Reproductive: Prostate is unremarkable. Other: Minimal free fluid in the left pericolic gutter. No ascites. No free air or intra-abdominal abscess. Small fat containing umbilical hernia. Subcutaneous edema and minimal subcutaneous fluid in the left gluteal crease without dominant fluid collection. No soft tissue air. Musculoskeletal: There are no acute or suspicious osseous abnormalities. Mild diffuse increased bone mineral density likely sequela of renal osteodystrophy. Benign-appearing sclerotic density in the left iliac bone is unchanged. IMPRESSION: 1. Acute uncomplicated diverticulitis in the distal descending colon. No perforation or abscess. 2. Extensive pan colonic diverticulosis. 3. Progressive splenomegaly over the past 15 months. 4. Minimal enteric contrast in the distal esophagus can be seen with reflux or delayed transit. 5. Increasing renal parenchymal calcifications of the right lower quadrant renal transplant since prior exam. No transplant hydronephrosis. 6. Edema and minimal subcutaneous fluid in the left gluteal crease is likely inflammatory without well-defined abscess. 7.  Aortic Atherosclerosis (ICD10-I70.0). Electronically Signed   By: Jeb Levering M.D.   On: 07/01/2017 06:16   Dg Chest Port 1 View  Result Date: 07/01/2017 CLINICAL DATA:  Hypoxia.  Weakness. EXAM: PORTABLE CHEST 1 VIEW COMPARISON:  Radiographs 03/27/2016 FINDINGS: Cardiomegaly which appears similar to prior exam. Mediastinal contours are unchanged. Diffuse peribronchial and interstitial opacities slightly more prominent on the left, increased. Blunting of the right costophrenic angle may be effusion or chronic, partially excluded from the field of view. No pneumothorax. IMPRESSION: CHF with cardiomegaly and interstitial edema. Possible small right pleural  effusion versus chronic pleural thickening not entirely included in the field of view. Electronically Signed   By: Jeb Levering M.D.   On: 07/01/2017 03:27      .Critical Care Performed by: Gregor Hams, MD Authorized by: Gregor Hams, MD   Critical care provider statement:    Critical care time (minutes):  30   Critical care time was exclusive of:  Separately billable procedures and treating other patients   Critical care was necessary to treat or prevent imminent or life-threatening deterioration of the following conditions:  Sepsis   Critical care was time spent personally by me on the following activities:  Development of treatment plan with patient or surrogate, discussions with consultants, evaluation of patient's response to treatment, examination of patient, obtaining history from patient or surrogate, ordering and performing treatments and interventions, ordering and review of laboratory studies, ordering and review of radiographic studies, pulse oximetry, re-evaluation of patient's condition and review of old charts   I assumed direction of critical care for this patient from another provider in my specialty: no       ____________________________________________   INITIAL IMPRESSION / ASSESSMENT  AND PLAN / ED COURSE  As part of my medical decision making, I reviewed the following data within the electronic MEDICAL RECORD NUMBER   47 year old male presented with above-stated history and physical exam secondary to abdominal pain poor p.o. intake and hypoglycemia.  Patient given one half amp D50 on arrival given glucose of 63 patient's repeat glucose only 82 following a half amp and as such an additional half amp was administered.  Patient met SIRS criteria and as such sepsis protocol was initiated patient given IV Zosyn IV saline was withheld as the patient is a end-stage renal patient and blood pressure was maintained with blood pressure 105/54 at present.  Patient  discussed with Dr. Genia Harold for hospital admission further evaluation and management.  [facility] and the [facility] [place] was notified however they have no available beds.  Patient was given IV morphine and subsequently IV fentanyl for pain with some improvement of discomfort however pain persists ____________________________________________  FINAL CLINICAL IMPRESSION(S) / ED DIAGNOSES  Final diagnoses:  Diverticulitis of large intestine without perforation or abscess without bleeding     MEDICATIONS GIVEN DURING THIS VISIT:  Medications  iopamidol (ISOVUE-300) 61 % injection 15 mL (15 mLs Oral Contrast Given 07/01/17 0323)  ondansetron (ZOFRAN) 4 MG/2ML injection (has no administration in time range)  dextrose 50 % solution 25 mL (25 mLs Intravenous Given 07/01/17 0303)  fentaNYL (SUBLIMAZE) injection 50 mcg (50 mcg Intravenous Given 07/01/17 0454)  dextrose 50 % solution 25 mL ( Intravenous Given 07/01/17 0456)  piperacillin-tazobactam (ZOSYN) IVPB 3.375 g (0 g Intravenous Stopped 07/01/17 0553)  ondansetron (ZOFRAN) injection 4 mg (4 mg Intravenous Given 07/01/17 6659)     ED Discharge Orders    None       Note:  This document was prepared using Dragon voice recognition software and may include unintentional dictation errors.    Gregor Hams, MD 07/01/17 [phone]

## 2017-07-01 NOTE — Progress Notes (Signed)
Post HD assessment. Pt tolerated tx well without c/o or complication. Net UF 509,goal met.    07/01/17 2333  Vital Signs  Temp 98.6 F (37 C)  Temp Source Oral  Pulse Rate 84  Pulse Rate Source Monitor  Resp (!) 21  BP 123/67  BP Location Right Arm  BP Method Automatic  Patient Position (if appropriate) Lying  Oxygen Therapy  SpO2 100 %  O2 Device Nasal Cannula  O2 Flow Rate (L/min) 4 L/min  Dialysis Weight  Weight 91.4 kg (201 lb 8 oz)  Type of Weight Post-Dialysis  Post-Hemodialysis Assessment  Rinseback Volume (mL) 250 mL  KECN 68.3 V  Dialyzer Clearance Lightly streaked  Duration of HD Treatment -hour(s) 3 hour(s)  Hemodialysis Intake (mL) 500 mL  UF Total -Machine (mL) 1009 mL  Net UF (mL) 509 mL  Tolerated HD Treatment Yes  AVG/AVF Arterial Site Held (minutes) 10 minutes  AVG/AVF Venous Site Held (minutes) 10 minutes  Education / Care Plan  Dialysis Education Provided Yes  Documented Education in Care Plan Yes

## 2017-07-01 NOTE — Care Management (Signed)
Amanda Morris dialysis liaison notified of admission.    

## 2017-07-01 NOTE — Progress Notes (Signed)
Pre HD assessment    07/01/17 2007  Neurological  Level of Consciousness Alert  Orientation Level Oriented X4  Respiratory  Respiratory Pattern Regular;Unlabored  Chest Assessment Chest expansion symmetrical  Cardiac  ECG Monitor Yes  Antiarrhythmic device No  Vascular  R Radial Pulse +2  L Radial Pulse +2  Integumentary  Integumentary (WDL) X  Skin Color Appropriate for ethnicity  Musculoskeletal  Musculoskeletal (WDL) X  Generalized Weakness Yes  Assistive Device None  GU Assessment  Genitourinary (WDL) X  Genitourinary Symptoms  (HD)  Psychosocial  Psychosocial (WDL) WDL

## 2017-07-01 NOTE — Progress Notes (Signed)
Post HD assessment    07/01/17 2333  Neurological  Level of Consciousness Alert  Orientation Level Oriented X4  Respiratory  Respiratory Pattern Regular;Unlabored  Chest Assessment Chest expansion symmetrical  Cardiac  ECG Monitor Yes  Antiarrhythmic device No  Vascular  R Radial Pulse +2  L Radial Pulse +2  Integumentary  Integumentary (WDL) X  Skin Color Appropriate for ethnicity  Musculoskeletal  Musculoskeletal (WDL) X  Generalized Weakness Yes  Assistive Device None  GU Assessment  Genitourinary (WDL) X  Genitourinary Symptoms  (HD)  Psychosocial  Psychosocial (WDL) WDL

## 2017-07-01 NOTE — Progress Notes (Signed)
Chaplain assisted patient in filing out a copy of the AD after education and conversation. The completed booklet is with the patient's other hospital brochures. The AD requires notarization. Chaplain tried to secure a notary from three different departments to no avail. Patient is aware of the delay, but wishes to complete ASAP.

## 2017-07-01 NOTE — ED Notes (Signed)
Called [facility] [phone] ext. [phone] no beds available at this time they will fax papers to be filled out

## 2017-07-01 NOTE — ED Notes (Signed)
ED Provider at bedside. 

## 2017-07-01 NOTE — Progress Notes (Signed)
HD tx end    07/01/17 2321  Vital Signs  Pulse Rate 90  Pulse Rate Source Monitor  Resp 14  BP (!) 112/57  BP Location Right Arm  BP Method Automatic  Patient Position (if appropriate) Lying  Oxygen Therapy  SpO2 100 %  O2 Device Nasal Cannula  O2 Flow Rate (L/min) 4 L/min  During Hemodialysis Assessment  Dialysis Fluid Bolus Normal Saline  Bolus Amount (mL) 250 mL  Intra-Hemodialysis Comments Tx completed

## 2017-07-01 NOTE — Progress Notes (Signed)
CODE SEPSIS - PHARMACY COMMUNICATION  **Broad Spectrum Antibiotics should be administered within 1 hour of Sepsis diagnosis**  Time Code Sepsis Called/Page Received: 06/12 0304  Antibiotics Ordered: n/a, per RN no code sepsis. Awaiting MD to cancel  Time of 1st antibiotic administration:   Additional action taken by pharmacy:   If necessary, Name of Provider/Nurse Contacted: Maxcine Ham ,PharmD Clinical Pharmacist  07/01/2017  4:54 AM

## 2017-07-01 NOTE — Progress Notes (Signed)
Anticoagulation monitoring(Lovenox):  47yo  male ordered Lovenox 40 mg Q24h  Filed Weights   07/01/17 0247  Weight: 191 lb (86.6 kg)   BMI 24.5   Lab Results  Component Value Date   CREATININE 7.97 (H) 07/01/2017   CREATININE 7.20 (H) 03/26/2016   CREATININE 8.72 (H) 10/23/2015   Estimated Creatinine Clearance: 13.2 mL/min (A) (by C-G formula based on SCr of 7.97 mg/dL (H)). Hemoglobin & Hematocrit     Component Value Date/Time   HGB 8.8 (L) 07/01/2017 0310   HCT 26.3 (L) 07/01/2017 0310     Per Protocol for Patient with estCrcl < 30 ml/min and BMI < 40, will transition to Lovenox 30 mg Q24h.

## 2017-07-01 NOTE — ED Notes (Signed)
Pt asleep at time of transport. Report called to floor. VSS. NAD.

## 2017-07-01 NOTE — Progress Notes (Signed)
1st attempt to get report    07/01/17 1900  Hand-Off documentation  Report given to (Full Name) Stark Bray  Report received from (Full Name) Mertha Finders

## 2017-07-01 NOTE — Care Management (Signed)
Consent for transfer to [facility] signed by patient and MD.  [name] along with clinical to [facility].

## 2017-07-01 NOTE — ED Notes (Signed)
Patient transported to CT 

## 2017-07-01 NOTE — ED Triage Notes (Signed)
Arrived via EMS with c/o weakness, pain and loss of appetite x 5 days. Daughter was going to take him to the [place] but pt got angry with her and called 911. Pt states he has pain from a boil on his buttocks that ruptured and he feels pain all over from his lupus. Pt wants something to drink and pain medication.

## 2017-07-01 NOTE — Progress Notes (Signed)
[facility], [place] 07/01/17  Subjective:   Patient known to our practice from previous admission in 2016 At this time, he reports that he presented to the hospital for evaluation of low blood sugar and left lower quadrant abdominal pain associated with nausea and vomiting.  He says he is was getting antibiotics for diverticulitis as outpatient.  Denies any diarrhea or blood in the stool.  CT scan of the abdomen shows acute uncomplicated diverticulitis in the distal descending colon without perforation or abscess.  Progressive splenomegaly over the past 15 months.  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  Objective:  Vital signs in last 24 hours:  Temp:  [98 F (36.7 C)-98.2 F (36.8 C)] 98.2 F (36.8 C) (06/12 0818) Pulse Rate:  [85-92] 85 (06/12 0818) Resp:  [11-22] 12 (06/12 0818) BP: (90-134)/(46-66) 105/52 (06/12 0818) SpO2:  [85 %-100 %] 100 % (06/12 0818) Weight:  [86.6 kg (191 lb)] 86.6 kg (191 lb) (06/12 0247)  Weight change:  Filed Weights   07/01/17 0247  Weight: 86.6 kg (191 lb)    Intake/Output:   No intake or output data in the 24 hours ending 07/01/17 1431   Physical Exam: General:  No acute distress, laying in the bed  HEENT  anicteric, moist oral mucous membranes  Neck  supple  Pulm/lungs  normal breathing effort, clear to auscultation  CVS/Heart  regular rate and rhythm  Abdomen:   Soft, mild left lower quadrant tenderness  Extremities:  no edema  Neurologic:  Alert, oriented  Skin:  No acute rashes  Access: AVF       Basic Metabolic Panel:  Recent Labs  Lab 07/01/17 0310  NA 138  K 4.1  CL 97*  CO2 27  GLUCOSE 73  BUN 32*  CREATININE 7.97*  CALCIUM 9.7     CBC: Recent Labs  Lab 07/01/17 0310  WBC 8.8  NEUTROABS 6.4  HGB 8.8*  HCT 26.3*  MCV 93.7  PLT 90*      Lab Results  Component Value Date   HEPBSAG Negative 12/09/2014      Microbiology:  Recent Results  (from the past 240 hour(s))  Blood Culture (routine x 2)     Status: None (Preliminary result)   Collection Time: 07/01/17  3:09 AM  Result Value Ref Range Status   Specimen Description BLOOD RIGHT [facility]  Final   Special Requests   Final    BOTTLES DRAWN AEROBIC AND ANAEROBIC Blood Culture adequate volume   Culture   Final    NO GROWTH < 12 HOURS Performed at [facility], [address] [place], [place] [address]    Report Status PENDING  Incomplete  Blood Culture (routine x 2)     Status: None (Preliminary result)   Collection Time: 07/01/17  3:09 AM  Result Value Ref Range Status   Specimen Description BLOOD RIGHT HAND  Final   Special Requests   Final    BOTTLES DRAWN AEROBIC AND ANAEROBIC Blood Culture results may not be optimal due to an excessive volume of blood received in culture bottles   Culture   Final    NO GROWTH < 12 HOURS Performed at [facility], [address], [place], [place] [address]    Report Status PENDING  Incomplete  MRSA PCR Screening     Status: Abnormal   Collection Time: 07/01/17  8:44 AM  Result Value Ref Range Status   MRSA by PCR POSITIVE (A)  NEGATIVE Final    Comment:        The GeneXpert MRSA Assay (FDA approved for NASAL specimens only), is one component of a comprehensive MRSA colonization surveillance program. It is not intended to diagnose MRSA infection nor to guide or monitor treatment for MRSA infections. RESULT CALLED TO, READ BACK BY AND VERIFIED WITH: JUAN RODRIGUEZ AT 4742 ON 07/01/2017 JJB Performed at [facility], [address] [place], [place] [address]     Coagulation Studies: No results for input(s): LABPROT, INR in the last 72 hours.  Urinalysis: No results for input(s): COLORURINE, LABSPEC, PHURINE, GLUCOSEU, HGBUR, BILIRUBINUR, KETONESUR, PROTEINUR, UROBILINOGEN, NITRITE, LEUKOCYTESUR in the last 72 hours.  Invalid input(s): APPERANCEUR    Imaging: Ct Abdomen Pelvis Wo  Contrast  Result Date: 07/01/2017 CLINICAL DATA:  Abdominal pain, weakness and decreased appetite. EXAM: CT ABDOMEN AND PELVIS WITHOUT CONTRAST TECHNIQUE: Multidetector CT imaging of the abdomen and pelvis was performed following the standard protocol without IV contrast. COMPARISON:  CT 03/27/2016 FINDINGS: Lower chest: Cardiomegaly with coronary artery calcifications. Chronic calcification adjacent to the right atrium. Chronic right lung base pleuroparenchymal scarring. Hepatobiliary: Prominent size liver spanning 21 cm cranial caudal. No discrete focal lesion allowing for lack contrast. Gallbladder physiologically distended, no calcified stone. No biliary dilatation. Pancreas: No ductal dilatation or inflammation. Spleen: Progressive splenomegaly from prior exam, spleen measures 17 x 14.5 x 9.1 cm. No focal splenic lesion. Adrenals/Urinary Tract: No adrenal nodule. Atrophic native kidneys with vascular calcification versus nonobstructing stone in the lower right kidney. No hydronephrosis. Right lower quadrant renal transplant with progressive diffuse renal parenchymal calcifications from prior exam. No transplant hydronephrosis. Urinary bladder is partially distended without wall thickening. Stomach/Bowel: Enteric contrast in the distal esophagus. Stomach physiologically distended. No small bowel dilatation, inflammation or obstruction. Extensive colonic diverticulosis throughout the entire colon. Mild colonic wall thickening and pericolonic edema in the distal descending colon in the region of multiple diverticula suspicious for diverticulitis. Minimal adjacent free fluid in the left pericolic gutter. Edema tracks in the left retroperitoneum. No perforation or abscess. The appendix is tentatively but not confidently visualized. No secondary findings to suggest appendicitis. Vascular/Lymphatic: Dense aortic and branch atherosclerosis. Multiple small retroperitoneal lymph nodes. No pelvic or mesenteric  adenopathy. Reproductive: Prostate is unremarkable. Other: Minimal free fluid in the left pericolic gutter. No ascites. No free air or intra-abdominal abscess. Small fat containing umbilical hernia. Subcutaneous edema and minimal subcutaneous fluid in the left gluteal crease without dominant fluid collection. No soft tissue air. Musculoskeletal: There are no acute or suspicious osseous abnormalities. Mild diffuse increased bone mineral density likely sequela of renal osteodystrophy. Benign-appearing sclerotic density in the left iliac bone is unchanged. IMPRESSION: 1. Acute uncomplicated diverticulitis in the distal descending colon. No perforation or abscess. 2. Extensive pan colonic diverticulosis. 3. Progressive splenomegaly over the past 15 months. 4. Minimal enteric contrast in the distal esophagus can be seen with reflux or delayed transit. 5. Increasing renal parenchymal calcifications of the right lower quadrant renal transplant since prior exam. No transplant hydronephrosis. 6. Edema and minimal subcutaneous fluid in the left gluteal crease is likely inflammatory without well-defined abscess. 7.  Aortic Atherosclerosis (ICD10-I70.0). Electronically Signed   By: Jeb Levering M.D.   On: 07/01/2017 06:16   Dg Chest Port 1 View  Result Date: 07/01/2017 CLINICAL DATA:  Hypoxia.  Weakness. EXAM: PORTABLE CHEST 1 VIEW COMPARISON:  Radiographs 03/27/2016 FINDINGS: Cardiomegaly which appears similar to prior exam. Mediastinal contours are unchanged. Diffuse peribronchial and interstitial opacities  slightly more prominent on the left, increased. Blunting of the right costophrenic angle may be effusion or chronic, partially excluded from the field of view. No pneumothorax. IMPRESSION: CHF with cardiomegaly and interstitial edema. Possible small right pleural effusion versus chronic pleural thickening not entirely included in the field of view. Electronically Signed   By: Jeb Levering M.D.   On: 07/01/2017  03:27     Medications:   . piperacillin-tazobactam (ZOSYN)  IV     . Chlorhexidine Gluconate Cloth  6 each Topical Q0600  . heparin injection (subcutaneous)  5,000 Units Subcutaneous Q8H  . mupirocin ointment  1 application Nasal BID  . pantoprazole  40 mg Oral BID  . predniSONE  10 mg Oral Q breakfast  . tamsulosin  0.8 mg Oral QHS   acetaminophen **OR** acetaminophen, HYDROcodone-acetaminophen, HYDROmorphone (DILAUDID) injection, ondansetron **OR** ondansetron (ZOFRAN) IV, polyethylene glycol  Assessment/ Plan:  47 y.o. male of ESRD on HD followed by [facility] in [place], anemia of CKD, secondary of hyperparathyroidism, systemic lupus erythematosus, history of failed renal transplant, left upper extremity AV fistula, who was admitted to [facility] on 12/08/2014 for evaluation of weakness.  1. ESRD on HD MWF: Pt had HD Monday. EDW (87.5-88 kg) Will arrange for routine HD today  2.  AOCKD - EPO with HD  3. SHPTH - monitor Phos  4. Diverticulitis - management and Abx as per Hospitalist team    LOS: 0 Khriz Liddy 6/12/20192:31 PM  [place], [place]  Note: This note was prepared with Dragon dictation. Any transcription errors are unintentional

## 2017-07-02 DIAGNOSIS — K5732 Diverticulitis of large intestine without perforation or abscess without bleeding: Principal | ICD-10-CM

## 2017-07-02 LAB — CBC
HCT: 23.9 % — ABNORMAL LOW (ref 40.0–52.0)
Hemoglobin: 7.9 g/dL — ABNORMAL LOW (ref 13.0–18.0)
MCH: 30.9 pg (ref 26.0–34.0)
MCHC: 33.2 g/dL (ref 32.0–36.0)
MCV: 93 fL (ref 80.0–100.0)
PLATELETS: 103 10*3/uL — AB (ref 150–440)
RBC: 2.57 MIL/uL — AB (ref 4.40–5.90)
RDW: 16 % — ABNORMAL HIGH (ref 11.5–14.5)
WBC: 6.4 10*3/uL (ref 3.8–10.6)

## 2017-07-02 LAB — BASIC METABOLIC PANEL
Anion gap: 7 (ref 5–15)
BUN: 18 mg/dL (ref 6–20)
CHLORIDE: 99 mmol/L — AB (ref 101–111)
CO2: 29 mmol/L (ref 22–32)
CREATININE: 5.12 mg/dL — AB (ref 0.61–1.24)
Calcium: 9.3 mg/dL (ref 8.9–10.3)
GFR calc Af Amer: 14 mL/min — ABNORMAL LOW (ref >60)
GFR calc non Af Amer: 12 mL/min — ABNORMAL LOW (ref >60)
Glucose, Bld: 115 mg/dL — ABNORMAL HIGH (ref 65–99)
Potassium: 4.3 mmol/L (ref 3.5–5.1)
Sodium: 135 mmol/L (ref 135–145)

## 2017-07-02 LAB — HIV ANTIBODY (ROUTINE TESTING W REFLEX): HIV SCREEN 4TH GENERATION: NONREACTIVE

## 2017-07-02 MED ORDER — HYDROMORPHONE HCL 1 MG/ML IJ SOLN
1.0000 mg | INTRAMUSCULAR | Status: DC | PRN
  Administered 2017-07-02 – 2017-07-03 (×4): 1 mg via INTRAVENOUS
  Filled 2017-07-02 (×5): qty 1

## 2017-07-02 MED ORDER — HYDROMORPHONE HCL 1 MG/ML IJ SOLN
INTRAMUSCULAR | Status: AC
  Filled 2017-07-02: qty 1

## 2017-07-02 NOTE — Plan of Care (Signed)
Pt is frequently requiring IV and oral pain meds. No other complaints

## 2017-07-02 NOTE — Progress Notes (Signed)
[facility], [place] 07/02/17  Subjective:   Patient known to our practice from previous admission in 2016 At this time, he reports that he presented to the hospital for evaluation of low blood sugar and left lower quadrant abdominal pain associated with nausea and vomiting.  He says he is was getting antibiotics for diverticulitis as outpatient.  Denies any diarrhea or blood in the stool.  CT scan of the abdomen shows acute uncomplicated diverticulitis in the distal descending colon without perforation or abscess.  Progressive splenomegaly over the past 15 months.  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    Objective:  Vital signs in last 24 hours:  Temp:  [97.8 F (36.6 C)-98.6 F (37 C)] 98.3 F (36.8 C) (06/13 1148) Pulse Rate:  [73-90] 86 (06/13 1148) Resp:  [12-21] 19 (06/13 1148) BP: (102-129)/(51-69) 127/69 (06/13 1148) SpO2:  [95 %-100 %] 96 % (06/13 1148) Weight:  [91.4 kg (201 lb 8 oz)-92.4 kg (203 lb 11.3 oz)] 91.4 kg (201 lb 8 oz) (06/12 2353)  Weight change: 5.763 kg (12 lb 11.3 oz) Filed Weights   07/01/17 2006 07/01/17 2333 07/01/17 2353  Weight: 92.4 kg (203 lb 11.3 oz) 91.4 kg (201 lb 8 oz) 91.4 kg (201 lb 8 oz)    Intake/Output:    Intake/Output Summary (Last 24 hours) at 07/02/2017 1643 Last data filed at 07/02/2017 1107 Gross per 24 hour  Intake 340 ml  Output 509 ml  Net -169 ml     Physical Exam: General:  No acute distress, laying in the bed  HEENT  anicteric, moist oral mucous membranes  Neck  supple  Pulm/lungs  normal breathing effort, clear to auscultation, O2 by [place]  CVS/Heart  regular rate and rhythm  Abdomen:   Soft, mild left lower quadrant tenderness  Extremities:  no edema  Neurologic:  Alert, oriented  Skin:  No acute rashes  Access: AVF       Basic Metabolic Panel:  Recent Labs  Lab 07/01/17 0310 07/02/17 0448  NA 138 135  K 4.1 4.3  CL 97* 99*  CO2 27 29  GLUCOSE 73  115*  BUN 32* 18  CREATININE 7.97* 5.12*  CALCIUM 9.7 9.3     CBC: Recent Labs  Lab 07/01/17 0310 07/02/17 0448  WBC 8.8 6.4  NEUTROABS 6.4  --   HGB 8.8* 7.9*  HCT 26.3* 23.9*  MCV 93.7 93.0  PLT 90* 103*      Lab Results  Component Value Date   HEPBSAG Negative 12/09/2014      Microbiology:  Recent Results (from the past 240 hour(s))  Blood Culture (routine x 2)     Status: None (Preliminary result)   Collection Time: 07/01/17  3:09 AM  Result Value Ref Range Status   Specimen Description BLOOD RIGHT [facility]  Final   Special Requests   Final    BOTTLES DRAWN AEROBIC AND ANAEROBIC Blood Culture adequate volume   Culture   Final    NO GROWTH 1 DAY Performed at [facility], [address] [place], [place] [address]    Report Status PENDING  Incomplete  Blood Culture (routine x 2)     Status: None (Preliminary result)   Collection Time: 07/01/17  3:09 AM  Result Value Ref Range Status   Specimen Description BLOOD RIGHT HAND  Final   Special Requests   Final    BOTTLES DRAWN AEROBIC AND ANAEROBIC Blood Culture results may not be  optimal due to an excessive volume of blood received in culture bottles   Culture   Final    NO GROWTH 1 DAY Performed at [facility], [address] [place], [place] [address]    Report Status PENDING  Incomplete  MRSA PCR Screening     Status: Abnormal   Collection Time: 07/01/17  8:44 AM  Result Value Ref Range Status   MRSA by PCR POSITIVE (A) NEGATIVE Final    Comment:        The GeneXpert MRSA Assay (FDA approved for NASAL specimens only), is one component of a comprehensive MRSA colonization surveillance program. It is not intended to diagnose MRSA infection nor to guide or monitor treatment for MRSA infections. RESULT CALLED TO, READ BACK BY AND VERIFIED WITH: JUAN RODRIGUEZ AT 7510 ON 07/01/2017 JJB Performed at [facility], [address] [place], [place] [address]     Coagulation  Studies: No results for input(s): LABPROT, INR in the last 72 hours.  Urinalysis: No results for input(s): COLORURINE, LABSPEC, PHURINE, GLUCOSEU, HGBUR, BILIRUBINUR, KETONESUR, PROTEINUR, UROBILINOGEN, NITRITE, LEUKOCYTESUR in the last 72 hours.  Invalid input(s): APPERANCEUR    Imaging: Ct Abdomen Pelvis Wo Contrast  Result Date: 07/01/2017 CLINICAL DATA:  Abdominal pain, weakness and decreased appetite. EXAM: CT ABDOMEN AND PELVIS WITHOUT CONTRAST TECHNIQUE: Multidetector CT imaging of the abdomen and pelvis was performed following the standard protocol without IV contrast. COMPARISON:  CT 03/27/2016 FINDINGS: Lower chest: Cardiomegaly with coronary artery calcifications. Chronic calcification adjacent to the right atrium. Chronic right lung base pleuroparenchymal scarring. Hepatobiliary: Prominent size liver spanning 21 cm cranial caudal. No discrete focal lesion allowing for lack contrast. Gallbladder physiologically distended, no calcified stone. No biliary dilatation. Pancreas: No ductal dilatation or inflammation. Spleen: Progressive splenomegaly from prior exam, spleen measures 17 x 14.5 x 9.1 cm. No focal splenic lesion. Adrenals/Urinary Tract: No adrenal nodule. Atrophic native kidneys with vascular calcification versus nonobstructing stone in the lower right kidney. No hydronephrosis. Right lower quadrant renal transplant with progressive diffuse renal parenchymal calcifications from prior exam. No transplant hydronephrosis. Urinary bladder is partially distended without wall thickening. Stomach/Bowel: Enteric contrast in the distal esophagus. Stomach physiologically distended. No small bowel dilatation, inflammation or obstruction. Extensive colonic diverticulosis throughout the entire colon. Mild colonic wall thickening and pericolonic edema in the distal descending colon in the region of multiple diverticula suspicious for diverticulitis. Minimal adjacent free fluid in the left pericolic  gutter. Edema tracks in the left retroperitoneum. No perforation or abscess. The appendix is tentatively but not confidently visualized. No secondary findings to suggest appendicitis. Vascular/Lymphatic: Dense aortic and branch atherosclerosis. Multiple small retroperitoneal lymph nodes. No pelvic or mesenteric adenopathy. Reproductive: Prostate is unremarkable. Other: Minimal free fluid in the left pericolic gutter. No ascites. No free air or intra-abdominal abscess. Small fat containing umbilical hernia. Subcutaneous edema and minimal subcutaneous fluid in the left gluteal crease without dominant fluid collection. No soft tissue air. Musculoskeletal: There are no acute or suspicious osseous abnormalities. Mild diffuse increased bone mineral density likely sequela of renal osteodystrophy. Benign-appearing sclerotic density in the left iliac bone is unchanged. IMPRESSION: 1. Acute uncomplicated diverticulitis in the distal descending colon. No perforation or abscess. 2. Extensive pan colonic diverticulosis. 3. Progressive splenomegaly over the past 15 months. 4. Minimal enteric contrast in the distal esophagus can be seen with reflux or delayed transit. 5. Increasing renal parenchymal calcifications of the right lower quadrant renal transplant since prior exam. No transplant hydronephrosis. 6. Edema and  minimal subcutaneous fluid in the left gluteal crease is likely inflammatory without well-defined abscess. 7.  Aortic Atherosclerosis (ICD10-I70.0). Electronically Signed   By: Jeb Levering M.D.   On: 07/01/2017 06:16   Dg Chest Port 1 View  Result Date: 07/01/2017 CLINICAL DATA:  Hypoxia.  Weakness. EXAM: PORTABLE CHEST 1 VIEW COMPARISON:  Radiographs 03/27/2016 FINDINGS: Cardiomegaly which appears similar to prior exam. Mediastinal contours are unchanged. Diffuse peribronchial and interstitial opacities slightly more prominent on the left, increased. Blunting of the right costophrenic angle may be effusion  or chronic, partially excluded from the field of view. No pneumothorax. IMPRESSION: CHF with cardiomegaly and interstitial edema. Possible small right pleural effusion versus chronic pleural thickening not entirely included in the field of view. Electronically Signed   By: Jeb Levering M.D.   On: 07/01/2017 03:27     Medications:   . piperacillin-tazobactam (ZOSYN)  IV Stopped (07/02/17 1107)   . Chlorhexidine Gluconate Cloth  6 each Topical Q0600  . [START ON 07/03/2017] epoetin (EPOGEN/PROCRIT) injection  4,000 Units Intravenous Q M,W,F-HD  . heparin injection (subcutaneous)  5,000 Units Subcutaneous Q8H  . mupirocin ointment  1 application Nasal BID  . pantoprazole  40 mg Oral BID  . predniSONE  10 mg Oral Q breakfast  . tamsulosin  0.8 mg Oral QHS   acetaminophen **OR** acetaminophen, HYDROcodone-acetaminophen, HYDROmorphone (DILAUDID) injection, ondansetron **OR** ondansetron (ZOFRAN) IV, polyethylene glycol, sodium chloride  Assessment/ Plan:  47 y.o. male of ESRD on HD followed by [facility] in [place], anemia of CKD, secondary of hyperparathyroidism, systemic lupus erythematosus, history of failed renal transplant, left upper extremity AV fistula, who was admitted to [facility] on 12/08/2014 for evaluation of weakness.  1. ESRD on HD MWF: Pt had HD Monday. EDW (87.5-88 kg) Routine hemodialysis tomorrow  2.  AOCKD - EPO with HD  3. SHPTH - monitor Phos  4. Diverticulitis - management and Abx as per Hospitalist/Surgery team    LOS: Evaro 6/13/20194:43 PM  [place], [place]  Note: This note was prepared with Dragon dictation. Any transcription errors are unintentional

## 2017-07-02 NOTE — Progress Notes (Signed)
CC: Acute diverticulitis Subjective: This patient seen with Dr. Genia Harold who has acute diverticulitis.  He also has multiple other medical problems including end-stage renal disease with dialysis.  He continues to have pain but no nausea or vomiting.  He has not had a bowel movement.  He has required Dilaudid for pain  Objective: Vital signs in last 24 hours: Temp:  [97.8 F (36.6 C)-98.6 F (37 C)] 98.2 F (36.8 C) (06/12 2353) Pulse Rate:  [73-90] 83 (06/12 2353) Resp:  [12-21] 20 (06/12 2353) BP: (102-129)/(51-68) 125/63 (06/12 2353) SpO2:  [95 %-100 %] 95 % (06/12 2353) Weight:  [201 lb 8 oz (91.4 kg)-203 lb 11.3 oz (92.4 kg)] 201 lb 8 oz (91.4 kg) (06/12 2353) Last BM Date: 06/30/17  Intake/Output from previous day: 06/12 0701 - 06/13 0700 In: 290 [P.O.:240; IV Piggyback:50] Out: 509  Intake/Output this shift: No intake/output data recorded.  Physical exam:  Vital signs stable and reviewed patient appears comfortable abdomen is distended slightly tympanitic tender in the left lower quadrant and less so in the right lower quadrant.  Some guarding but no rebound or percussion tenderness.  Calves are nontender  Lab Results: CBC  Recent Labs    07/01/17 0310 07/02/17 0448  WBC 8.8 6.4  HGB 8.8* 7.9*  HCT 26.3* 23.9*  PLT 90* 103*   BMET Recent Labs    07/01/17 0310 07/02/17 0448  NA 138 135  K 4.1 4.3  CL 97* 99*  CO2 27 29  GLUCOSE 73 115*  BUN 32* 18  CREATININE 7.97* 5.12*  CALCIUM 9.7 9.3   PT/INR No results for input(s): LABPROT, INR in the last 72 hours. ABG No results for input(s): PHART, HCO3 in the last 72 hours.  Invalid input(s): PCO2, PO2  Studies/Results: Ct Abdomen Pelvis Wo Contrast  Result Date: 07/01/2017 CLINICAL DATA:  Abdominal pain, weakness and decreased appetite. EXAM: CT ABDOMEN AND PELVIS WITHOUT CONTRAST TECHNIQUE: Multidetector CT imaging of the abdomen and pelvis was performed following the standard protocol without IV contrast.  COMPARISON:  CT 03/27/2016 FINDINGS: Lower chest: Cardiomegaly with coronary artery calcifications. Chronic calcification adjacent to the right atrium. Chronic right lung base pleuroparenchymal scarring. Hepatobiliary: Prominent size liver spanning 21 cm cranial caudal. No discrete focal lesion allowing for lack contrast. Gallbladder physiologically distended, no calcified stone. No biliary dilatation. Pancreas: No ductal dilatation or inflammation. Spleen: Progressive splenomegaly from prior exam, spleen measures 17 x 14.5 x 9.1 cm. No focal splenic lesion. Adrenals/Urinary Tract: No adrenal nodule. Atrophic native kidneys with vascular calcification versus nonobstructing stone in the lower right kidney. No hydronephrosis. Right lower quadrant renal transplant with progressive diffuse renal parenchymal calcifications from prior exam. No transplant hydronephrosis. Urinary bladder is partially distended without wall thickening. Stomach/Bowel: Enteric contrast in the distal esophagus. Stomach physiologically distended. No small bowel dilatation, inflammation or obstruction. Extensive colonic diverticulosis throughout the entire colon. Mild colonic wall thickening and pericolonic edema in the distal descending colon in the region of multiple diverticula suspicious for diverticulitis. Minimal adjacent free fluid in the left pericolic gutter. Edema tracks in the left retroperitoneum. No perforation or abscess. The appendix is tentatively but not confidently visualized. No secondary findings to suggest appendicitis. Vascular/Lymphatic: Dense aortic and branch atherosclerosis. Multiple small retroperitoneal lymph nodes. No pelvic or mesenteric adenopathy. Reproductive: Prostate is unremarkable. Other: Minimal free fluid in the left pericolic gutter. No ascites. No free air or intra-abdominal abscess. Small fat containing umbilical hernia. Subcutaneous edema and minimal subcutaneous fluid in the left gluteal  crease without  dominant fluid collection. No soft tissue air. Musculoskeletal: There are no acute or suspicious osseous abnormalities. Mild diffuse increased bone mineral density likely sequela of renal osteodystrophy. Benign-appearing sclerotic density in the left iliac bone is unchanged. IMPRESSION: 1. Acute uncomplicated diverticulitis in the distal descending colon. No perforation or abscess. 2. Extensive pan colonic diverticulosis. 3. Progressive splenomegaly over the past 15 months. 4. Minimal enteric contrast in the distal esophagus can be seen with reflux or delayed transit. 5. Increasing renal parenchymal calcifications of the right lower quadrant renal transplant since prior exam. No transplant hydronephrosis. 6. Edema and minimal subcutaneous fluid in the left gluteal crease is likely inflammatory without well-defined abscess. 7.  Aortic Atherosclerosis (ICD10-I70.0). Electronically Signed   By: Jeb Levering M.D.   On: 07/01/2017 06:16   Dg Chest Port 1 View  Result Date: 07/01/2017 CLINICAL DATA:  Hypoxia.  Weakness. EXAM: PORTABLE CHEST 1 VIEW COMPARISON:  Radiographs 03/27/2016 FINDINGS: Cardiomegaly which appears similar to prior exam. Mediastinal contours are unchanged. Diffuse peribronchial and interstitial opacities slightly more prominent on the left, increased. Blunting of the right costophrenic angle may be effusion or chronic, partially excluded from the field of view. No pneumothorax. IMPRESSION: CHF with cardiomegaly and interstitial edema. Possible small right pleural effusion versus chronic pleural thickening not entirely included in the field of view. Electronically Signed   By: Jeb Levering M.D.   On: 07/01/2017 03:27    Anti-infectives: Anti-infectives (From admission, onward)   Start     Dose/Rate Route Frequency Ordered Stop   07/01/17 1800  piperacillin-tazobactam (ZOSYN) IVPB 3.375 g     3.375 g 12.5 mL/hr over 240 Minutes Intravenous Every 12 hours 07/01/17 0833     07/01/17  0500  piperacillin-tazobactam (ZOSYN) IVPB 3.375 g     3.375 g 100 mL/hr over 30 Minutes Intravenous  Once 07/01/17 0458 07/01/17 0553      Assessment/Plan:  Normal white blood cell count Slight improvement with IV antibiotics for the last 24 hours.  Would recommend continuing IVs at this time and then switching to oral antibiotics when able for discharge.  Currently he requires inpatient IV antibiotics until considerable improvement is noted.  Florene Glen, MD, FACS  07/02/2017

## 2017-07-02 NOTE — Progress Notes (Signed)
Advance Directive completed, notarized, and included in the patient's medical record.

## 2017-07-02 NOTE — Progress Notes (Signed)
[facility] [place] NAME: [name]    MR#:  [id number]  DATE OF BIRTH:  [birth year]  SUBJECTIVE:   Still with some abdominal pain  REVIEW OF SYSTEMS:    Review of Systems  Constitutional: Negative for fever, chills weight loss HENT: Negative for ear pain, nosebleeds, congestion, facial swelling, rhinorrhea, neck pain, neck stiffness and ear discharge.   Respiratory: Negative for cough, shortness of breath, wheezing  Cardiovascular: Negative for chest pain, palpitations and leg swelling.  Gastrointestinal: Negative for heartburn, ++abdominal pain, No vomiting, diarrhea or consitpation Genitourinary: Negative for dysuria, urgency, frequency, hematuria Musculoskeletal: Negative for back pain or joint pain Neurological: Negative for dizziness, seizures, syncope, focal weakness,  numbness and headaches.  Hematological: Does not bruise/bleed easily.  Psychiatric/Behavioral: Negative for hallucinations, confusion, dysphoric mood    Tolerating Diet: yes      DRUG ALLERGIES:   Allergies  Allergen Reactions  . Ciprofloxacin   . Sulfa Antibiotics     VITALS:  Blood pressure 125/63, pulse 83, temperature 98.2 F (36.8 C), temperature source Oral, resp. rate 20, height 6\' 2"  (1.88 m), weight 91.4 kg (201 lb 8 oz), SpO2 95 %.  PHYSICAL EXAMINATION:  Constitutional: Appears well-developed and well-nourished. No distress. HENT: Normocephalic. Marland Kitchen Oropharynx is clear and moist.  Eyes: Conjunctivae and EOM are normal. PERRLA, no scleral icterus.  Neck: Normal ROM. Neck supple. No JVD. No tracheal deviation. CVS: RRR, S1/S2 +, no murmurs, no gallops, no carotid bruit.  Pulmonary: Effort and breath sounds normal, no stridor, rhonchi, wheezes, rales.  Abdominal: Soft. BS +,  no distension, ++LLQ tenderness, NO rebound or guarding.  Musculoskeletal: Normal range of motion. No edema and no tenderness.  Neuro: Alert. CN 2-12 grossly intact. No  focal deficits. Skin: Skin is warm and dry. No rash noted. Psychiatric: Normal mood and affect.      LABORATORY PANEL:   CBC Recent Labs  Lab 07/02/17 0448  WBC 6.4  HGB 7.9*  HCT 23.9*  PLT 103*   ------------------------------------------------------------------------------------------------------------------  Chemistries  Recent Labs  Lab 07/01/17 0310 07/02/17 0448  NA 138 135  K 4.1 4.3  CL 97* 99*  CO2 27 29  GLUCOSE 73 115*  BUN 32* 18  CREATININE 7.97* 5.12*  CALCIUM 9.7 9.3  AST 31  --   ALT 15*  --   ALKPHOS 199*  --   BILITOT 6.6*  --    ------------------------------------------------------------------------------------------------------------------  Cardiac Enzymes No results for input(s): TROPONINI in the last 168 hours. ------------------------------------------------------------------------------------------------------------------  RADIOLOGY:  Ct Abdomen Pelvis Wo Contrast  Result Date: 07/01/2017 CLINICAL DATA:  Abdominal pain, weakness and decreased appetite. EXAM: CT ABDOMEN AND PELVIS WITHOUT CONTRAST TECHNIQUE: Multidetector CT imaging of the abdomen and pelvis was performed following the standard protocol without IV contrast. COMPARISON:  CT 03/27/2016 FINDINGS: Lower chest: Cardiomegaly with coronary artery calcifications. Chronic calcification adjacent to the right atrium. Chronic right lung base pleuroparenchymal scarring. Hepatobiliary: Prominent size liver spanning 21 cm cranial caudal. No discrete focal lesion allowing for lack contrast. Gallbladder physiologically distended, no calcified stone. No biliary dilatation. Pancreas: No ductal dilatation or inflammation. Spleen: Progressive splenomegaly from prior exam, spleen measures 17 x 14.5 x 9.1 cm. No focal splenic lesion. Adrenals/Urinary Tract: No adrenal nodule. Atrophic native kidneys with vascular calcification versus nonobstructing stone in the lower right kidney. No hydronephrosis.  Right lower quadrant renal transplant with progressive diffuse renal parenchymal calcifications from prior exam. No transplant hydronephrosis. Urinary bladder is partially  distended without wall thickening. Stomach/Bowel: Enteric contrast in the distal esophagus. Stomach physiologically distended. No small bowel dilatation, inflammation or obstruction. Extensive colonic diverticulosis throughout the entire colon. Mild colonic wall thickening and pericolonic edema in the distal descending colon in the region of multiple diverticula suspicious for diverticulitis. Minimal adjacent free fluid in the left pericolic gutter. Edema tracks in the left retroperitoneum. No perforation or abscess. The appendix is tentatively but not confidently visualized. No secondary findings to suggest appendicitis. Vascular/Lymphatic: Dense aortic and branch atherosclerosis. Multiple small retroperitoneal lymph nodes. No pelvic or mesenteric adenopathy. Reproductive: Prostate is unremarkable. Other: Minimal free fluid in the left pericolic gutter. No ascites. No free air or intra-abdominal abscess. Small fat containing umbilical hernia. Subcutaneous edema and minimal subcutaneous fluid in the left gluteal crease without dominant fluid collection. No soft tissue air. Musculoskeletal: There are no acute or suspicious osseous abnormalities. Mild diffuse increased bone mineral density likely sequela of renal osteodystrophy. Benign-appearing sclerotic density in the left iliac bone is unchanged. IMPRESSION: 1. Acute uncomplicated diverticulitis in the distal descending colon. No perforation or abscess. 2. Extensive pan colonic diverticulosis. 3. Progressive splenomegaly over the past 15 months. 4. Minimal enteric contrast in the distal esophagus can be seen with reflux or delayed transit. 5. Increasing renal parenchymal calcifications of the right lower quadrant renal transplant since prior exam. No transplant hydronephrosis. 6. Edema and  minimal subcutaneous fluid in the left gluteal crease is likely inflammatory without well-defined abscess. 7.  Aortic Atherosclerosis (ICD10-I70.0). Electronically Signed   By: Jeb Levering M.D.   On: 07/01/2017 06:16   Dg Chest Port 1 View  Result Date: 07/01/2017 CLINICAL DATA:  Hypoxia.  Weakness. EXAM: PORTABLE CHEST 1 VIEW COMPARISON:  Radiographs 03/27/2016 FINDINGS: Cardiomegaly which appears similar to prior exam. Mediastinal contours are unchanged. Diffuse peribronchial and interstitial opacities slightly more prominent on the left, increased. Blunting of the right costophrenic angle may be effusion or chronic, partially excluded from the field of view. No pneumothorax. IMPRESSION: CHF with cardiomegaly and interstitial edema. Possible small right pleural effusion versus chronic pleural thickening not entirely included in the field of view. Electronically Signed   By: Jeb Levering M.D.   On: 07/01/2017 03:27     ASSESSMENT AND PLAN:   47 year old male with lupus and end-stage renal disease who presented with abdominal pain.  1.  Acute uncomplicated diverticulitis in the distal descending colon without perforation or abscess: Continue Zosyn for today and plan to transition to oral Augmentin tomorrow. Surgery consultation initiated. Pain medications and antiemetics for supportive management   2.  End-stage renal disease on hemodialysis: Continue dialysis as per routine.  3.  Chronic respiratory failure from end-stage renal disease and chronic diastolic heart failure  4.  History of lupus: Continue daily steroids  5.  Chronic anemia: Monitor hemoglobin May need EPO during HD  6.  Low platelets: PLT count up today        Management plans discussed with the patient and he is in agreement.  CODE STATUS: full  TOTAL TIME TAKING CARE OF THIS PATIENT: 30 minutes.     POSSIBLE D/C tomorrow, DEPENDING ON CLINICAL CONDITION.   Icarus Partch M.D on 07/02/2017 at  9:56 AM  Between 7am to 6pm - Pager - [phone] After 6pm go to [email or web address] - password [email or web address] [facility]  Office  [phone]  CC: Primary care physician; System, Provider Not In  Note: This dictation was prepared with Dragon dictation along with smaller phrase technology.  Any transcriptional errors that result from this process are unintentional.

## 2017-07-03 LAB — PHOSPHORUS: Phosphorus: 5 mg/dL — ABNORMAL HIGH (ref 2.5–4.6)

## 2017-07-03 MED ORDER — AMOXICILLIN-POT CLAVULANATE 500-125 MG PO TABS: 1.0000 | ORAL_TABLET | Freq: Every day | ORAL | 0 refills | Status: AC

## 2017-07-03 MED ORDER — ONDANSETRON 4 MG PO TBDP: 4.0000 mg | ORAL_TABLET | Freq: Three times a day (TID) | ORAL | 0 refills | Status: DC | PRN

## 2017-07-03 MED ORDER — PREDNISONE 10 MG PO TABS: 10.0000 mg | ORAL_TABLET | Freq: Every day | ORAL | 0 refills | Status: DC

## 2017-07-03 NOTE — Progress Notes (Signed)
Pre HD assessment    07/03/17 0954  Neurological  Level of Consciousness Alert  Orientation Level Oriented X4  Respiratory  Respiratory Pattern Regular;Unlabored  Chest Assessment Chest expansion symmetrical  Cardiac  ECG Monitor Yes  Antiarrhythmic device No  Vascular  R Radial Pulse +2  L Radial Pulse +2  Integumentary  Integumentary (WDL) X  Skin Color Appropriate for ethnicity  Musculoskeletal  Musculoskeletal (WDL) X  Generalized Weakness Yes  Assistive Device None  GU Assessment  Genitourinary (WDL) X  Genitourinary Symptoms  (HD)  Psychosocial  Psychosocial (WDL) WDL

## 2017-07-03 NOTE — Care Management (Signed)
[name] dialysis liaison notified of discharge Discharge summary faxed to [facility]

## 2017-07-03 NOTE — Progress Notes (Signed)
[facility], [place] 07/03/17  Subjective:   Patient known to our practice from previous admission in 2016 At this time, he reports that he presented to the hospital for evaluation of low blood sugar and left lower quadrant abdominal pain associated with nausea and vomiting.  He says he is was getting antibiotics for diverticulitis as outpatient.  Denies any diarrhea or blood in the stool.  CT scan of the abdomen shows acute uncomplicated diverticulitis in the distal descending colon without perforation or abscess.  Progressive splenomegaly over the past 15 months.  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 Doing well today Anticipated d/c to home    HEMODIALYSIS FLOWSHEET:  Blood Flow Rate (mL/min): 400 mL/min Arterial Pressure (mmHg): -170 mmHg Venous Pressure (mmHg): 190 mmHg Transmembrane Pressure (mmHg): 60 mmHg Ultrafiltration Rate (mL/min): 280 mL/min Dialysate Flow Rate (mL/min): 800 ml/min Conductivity: Machine : 13.6 Conductivity: Machine : 13.6 Dialysis Fluid Bolus: Normal Saline Bolus Amount (mL): 250 mL     Objective:  Vital signs in last 24 hours:  Temp:  [97.8 F (36.6 C)-98.3 F (36.8 C)] 97.8 F (36.6 C) (06/14 0953) Pulse Rate:  [63-85] 82 (06/14 1245) Resp:  [10-23] 17 (06/14 1245) BP: (94-131)/(58-73) 113/73 (06/14 1245) SpO2:  [100 %] 100 % (06/14 1245) Weight:  [91.4 kg (201 lb 8 oz)] 91.4 kg (201 lb 8 oz) (06/14 0953)  Weight change:  Filed Weights   07/01/17 2333 07/01/17 2353 07/03/17 0953  Weight: 91.4 kg (201 lb 8 oz) 91.4 kg (201 lb 8 oz) 91.4 kg (201 lb 8 oz)    Intake/Output:    Intake/Output Summary (Last 24 hours) at 07/03/2017 1310 Last data filed at 07/03/2017 0500 Gross per 24 hour  Intake 240 ml  Output 0 ml  Net 240 ml     Physical Exam: General:  No acute distress, laying in the bed  HEENT  anicteric, moist oral mucous membranes  Neck  supple  Pulm/lungs  normal  breathing effort, clear to auscultation, O2 by [place]  CVS/Heart  regular rate and rhythm  Abdomen:   Soft, mild left lower quadrant tenderness  Extremities:  no edema  Neurologic:  Alert, oriented  Skin:  No acute rashes  Access: AVF       Basic Metabolic Panel:  Recent Labs  Lab 07/01/17 0310 07/02/17 0448 07/03/17 1036  NA 138 135  --   K 4.1 4.3  --   CL 97* 99*  --   CO2 27 29  --   GLUCOSE 73 115*  --   BUN 32* 18  --   CREATININE 7.97* 5.12*  --   CALCIUM 9.7 9.3  --   PHOS  --   --  5.0*     CBC: Recent Labs  Lab 07/01/17 0310 07/02/17 0448  WBC 8.8 6.4  NEUTROABS 6.4  --   HGB 8.8* 7.9*  HCT 26.3* 23.9*  MCV 93.7 93.0  PLT 90* 103*      Lab Results  Component Value Date   HEPBSAG Negative 12/09/2014      Microbiology:  Recent Results (from the past 240 hour(s))  Blood Culture (routine x 2)     Status: None (Preliminary result)   Collection Time: 07/01/17  3:09 AM  Result Value Ref Range Status   Specimen Description BLOOD RIGHT [facility]  Final   Special Requests   Final    BOTTLES DRAWN AEROBIC AND ANAEROBIC Blood Culture adequate volume   Culture  Final    NO GROWTH 2 DAYS Performed at [facility], [address] [place], [place] [address]    Report Status PENDING  Incomplete  Blood Culture (routine x 2)     Status: None (Preliminary result)   Collection Time: 07/01/17  3:09 AM  Result Value Ref Range Status   Specimen Description BLOOD RIGHT HAND  Final   Special Requests   Final    BOTTLES DRAWN AEROBIC AND ANAEROBIC Blood Culture results may not be optimal due to an excessive volume of blood received in culture bottles   Culture   Final    NO GROWTH 2 DAYS Performed at [facility], [address], [place], [place] [address]    Report Status PENDING  Incomplete  MRSA PCR Screening     Status: Abnormal   Collection Time: 07/01/17  8:44 AM  Result Value Ref Range Status   MRSA by PCR POSITIVE (A) NEGATIVE Final     Comment:        The GeneXpert MRSA Assay (FDA approved for NASAL specimens only), is one component of a comprehensive MRSA colonization surveillance program. It is not intended to diagnose MRSA infection nor to guide or monitor treatment for MRSA infections. RESULT CALLED TO, READ BACK BY AND VERIFIED WITH: JUAN RODRIGUEZ AT 8546 ON 07/01/2017 JJB Performed at [facility], [address] [place], [place] [address]     Coagulation Studies: No results for input(s): LABPROT, INR in the last 72 hours.  Urinalysis: No results for input(s): COLORURINE, LABSPEC, PHURINE, GLUCOSEU, HGBUR, BILIRUBINUR, KETONESUR, PROTEINUR, UROBILINOGEN, NITRITE, LEUKOCYTESUR in the last 72 hours.  Invalid input(s): APPERANCEUR    Imaging: No results found.   Medications:   . piperacillin-tazobactam (ZOSYN)  IV 3.375 g (07/03/17 0629)   . Chlorhexidine Gluconate Cloth  6 each Topical Q0600  . epoetin (EPOGEN/PROCRIT) injection  4,000 Units Intravenous Q M,W,F-HD  . heparin injection (subcutaneous)  5,000 Units Subcutaneous Q8H  . mupirocin ointment  1 application Nasal BID  . pantoprazole  40 mg Oral BID  . predniSONE  10 mg Oral Q breakfast  . tamsulosin  0.8 mg Oral QHS   acetaminophen **OR** acetaminophen, HYDROcodone-acetaminophen, HYDROmorphone (DILAUDID) injection, ondansetron **OR** ondansetron (ZOFRAN) IV, polyethylene glycol, sodium chloride  Assessment/ Plan:  47 y.o. male of ESRD on HD followed by [facility] in [place], anemia of CKD, secondary of hyperparathyroidism, systemic lupus erythematosus, history of failed renal transplant, left upper extremity AV fistula, who was admitted to [facility] on 12/08/2014 for evaluation of weakness.  1. ESRD on HD MWF: Pt had HD Monday. EDW (87.5-88 kg) Patient seen during dialysis Tolerating well   2.  AOCKD - EPO with HD  3. SHPTH - monitor Phos  4. Diverticulitis - management and Abx as per Hospitalist/Surgery  team    LOS: [place] 6/14/20191:10 PM  [place], [place]  Note: This note was prepared with Dragon dictation. Any transcription errors are unintentional

## 2017-07-03 NOTE — Progress Notes (Signed)
HD tx start    07/03/17 1006  Vital Signs  Pulse Rate 72  Pulse Rate Source Monitor  Resp 19  BP 98/68  BP Location Right Arm  BP Method Automatic  Patient Position (if appropriate) Lying  Oxygen Therapy  SpO2 100 %  O2 Device Nasal Cannula  O2 Flow Rate (L/min) 4 L/min  During Hemodialysis Assessment  Blood Flow Rate (mL/min) 400 mL/min  Arterial Pressure (mmHg) -150 mmHg  Venous Pressure (mmHg) 160 mmHg  Transmembrane Pressure (mmHg) 70 mmHg  Ultrafiltration Rate (mL/min) 290 mL/min  Dialysate Flow Rate (mL/min) 800 ml/min  Conductivity: Machine  14.3  HD Safety Checks Performed Yes  Dialysis Fluid Bolus Normal Saline  Bolus Amount (mL) 250 mL  Intra-Hemodialysis Comments Tx initiated

## 2017-07-03 NOTE — Discharge Summary (Signed)
[facility] [employer] NAME: [name]    MR#:  [id number]  DATE OF BIRTH:  [birth year]  DATE OF ADMISSION:  07/01/2017 ADMITTING PHYSICIAN: Bettey Costa, MD  DATE OF DISCHARGE: 07/03/2017  PRIMARY CARE PHYSICIAN: System, Provider Not In    ADMISSION DIAGNOSIS:  Diverticulitis of large intestine without perforation or abscess without bleeding [K57.32] Sepsis, due to unspecified organism ([place]) [A41.9]  DISCHARGE DIAGNOSIS:  Active Problems:   Diverticulitis   Diverticulitis of large intestine without perforation or abscess without bleeding   SECONDARY DIAGNOSIS:   Past Medical History:  Diagnosis Date  . Anemia   . Collagen vascular disease ([place])   . Hypertension   . Lupus ([place])   . Renal disorder   . Renal insufficiency   . Renal transplant recipient     HOSPITAL COURSE:   47 year old male with lupus and end-stage renal disease who presented with abdominal pain.  1. Acute uncomplicated diverticulitis in the distal descending colon without perforation or abscess: He was on IV Zosyn and transitioned to renally dose Augmentin at discharge.  He was evaluated surgery.  This is his first bout of diverticulitis.  His symptoms are resolving.  He will complete antibiotic treatment.   2. End-stage renal disease on hemodialysis: We will Continue dialysis as per routine.  3. Chronic respiratory failure from end-stage renal disease and chronic diastolic heart failure  4. History of lupus: Continue daily steroids  5. Chronic anemia: Patient received Epogen during dialysis 6. Low platelets:  Will need outpatient follow-up for thrombocytopenia     DISCHARGE CONDITIONS AND DIET:   Stable for discharge on renal diet  CONSULTS OBTAINED:  Treatment Team:  Murlean Iba, MD Hadley Pen, DO Vickie Epley, MD  DRUG ALLERGIES:   Allergies  Allergen Reactions  . Ciprofloxacin   . Sulfa Antibiotics      DISCHARGE MEDICATIONS:   Allergies as of 07/03/2017      Reactions   Ciprofloxacin    Sulfa Antibiotics       Medication List    STOP taking these medications   hydroxychloroquine 200 MG tablet Commonly known as:  PLAQUENIL   sildenafil 50 MG tablet Commonly known as:  VIAGRA     TAKE these medications   amoxicillin-clavulanate 500-125 MG tablet Commonly known as:  AUGMENTIN Take 1 tablet (500 mg total) by mouth daily for 7 days.   ferrous sulfate 324 (65 Fe) MG Tbec Take 1 tablet (325 mg total) by mouth 2 (two) times daily.   ondansetron 4 MG disintegrating tablet Commonly known as:  ZOFRAN ODT Take 1 tablet (4 mg total) by mouth every 8 (eight) hours as needed for nausea or vomiting.   pantoprazole 40 MG tablet Commonly known as:  PROTONIX Take 1 tablet (40 mg total) by mouth 2 (two) times daily.   predniSONE 10 MG tablet Commonly known as:  DELTASONE Take 1 tablet (10 mg total) by mouth daily with breakfast. Start taking on:  07/04/2017 What changed:    medication strength  how much to take  when to take this   tamsulosin 0.4 MG Caps capsule Commonly known as:  FLOMAX Take 0.8 mg by mouth at bedtime.         Today   CHIEF COMPLAINT:   No acute issues overnight   VITAL SIGNS:  Blood pressure 131/73, pulse 83, temperature 98.3 F (36.8 C), temperature source Oral, resp. rate 20, height 6\' 2"  (1.88 m), weight 91.4  kg (201 lb 8 oz), SpO2 100 %.   REVIEW OF SYSTEMS:  Review of Systems  Constitutional: Negative.  Negative for chills, fever and malaise/fatigue.  HENT: Negative.  Negative for ear discharge, ear pain, hearing loss, nosebleeds and sore throat.   Eyes: Negative.  Negative for blurred vision and pain.  Respiratory: Negative.  Negative for cough, hemoptysis, shortness of breath and wheezing.   Cardiovascular: Negative.  Negative for chest pain, palpitations and leg swelling.  Gastrointestinal: Negative.  Negative for abdominal  pain, blood in stool, diarrhea, nausea and vomiting.  Genitourinary: Negative.  Negative for dysuria.  Musculoskeletal: Negative.  Negative for back pain.  Skin: Negative.   Neurological: Negative for dizziness, tremors, speech change, focal weakness, seizures and headaches.  Endo/Heme/Allergies: Negative.  Does not bruise/bleed easily.  Psychiatric/Behavioral: Negative.  Negative for depression, hallucinations and suicidal ideas.     PHYSICAL EXAMINATION:  GENERAL:  47 y.o.-year-old patient lying in the bed with no acute distress.  NECK:  Supple, no jugular venous distention. No thyroid enlargement, no tenderness.  LUNGS: Normal breath sounds bilaterally, no wheezing, rales,rhonchi  No use of accessory muscles of respiration.  CARDIOVASCULAR: S1, S2 normal. No murmurs, rubs, or gallops.  ABDOMEN: Soft, non-tender, non-distended. Bowel sounds present. No organomegaly or mass.  EXTREMITIES: No pedal edema, cyanosis, or clubbing.  PSYCHIATRIC: The patient is alert and oriented x 3.  SKIN: No obvious rash, lesion, or ulcer.   DATA REVIEW:   CBC Recent Labs  Lab 07/02/17 0448  WBC 6.4  HGB 7.9*  HCT 23.9*  PLT 103*    Chemistries  Recent Labs  Lab 07/01/17 0310 07/02/17 0448  NA 138 135  K 4.1 4.3  CL 97* 99*  CO2 27 29  GLUCOSE 73 115*  BUN 32* 18  CREATININE 7.97* 5.12*  CALCIUM 9.7 9.3  AST 31  --   ALT 15*  --   ALKPHOS 199*  --   BILITOT 6.6*  --     Cardiac Enzymes No results for input(s): TROPONINI in the last 168 hours.  Microbiology Results  @MICRORSLT48 @  RADIOLOGY:  No results found.    Allergies as of 07/03/2017      Reactions   Ciprofloxacin    Sulfa Antibiotics       Medication List    STOP taking these medications   hydroxychloroquine 200 MG tablet Commonly known as:  PLAQUENIL   sildenafil 50 MG tablet Commonly known as:  VIAGRA     TAKE these medications   amoxicillin-clavulanate 500-125 MG tablet Commonly known as:   AUGMENTIN Take 1 tablet (500 mg total) by mouth daily for 7 days.   ferrous sulfate 324 (65 Fe) MG Tbec Take 1 tablet (325 mg total) by mouth 2 (two) times daily.   ondansetron 4 MG disintegrating tablet Commonly known as:  ZOFRAN ODT Take 1 tablet (4 mg total) by mouth every 8 (eight) hours as needed for nausea or vomiting.   pantoprazole 40 MG tablet Commonly known as:  PROTONIX Take 1 tablet (40 mg total) by mouth 2 (two) times daily.   predniSONE 10 MG tablet Commonly known as:  DELTASONE Take 1 tablet (10 mg total) by mouth daily with breakfast. Start taking on:  07/04/2017 What changed:    medication strength  how much to take  when to take this   tamsulosin 0.4 MG Caps capsule Commonly known as:  FLOMAX Take 0.8 mg by mouth at bedtime.  Management plans discussed with the patient and he is in agreement. Stable for discharge   Patient should follow up with pcp  CODE STATUS:     Code Status Orders  (From admission, onward)        Start     Ordered   07/01/17 0825  Full code  Continuous     07/01/17 0824    Code Status History    Date Active Date Inactive Code Status Order ID Comments User Context   12/09/2014 0225 12/10/2014 2013 Full Code [id number]  Harrie Foreman, MD Inpatient    Advance Directive Documentation     Most Recent Value  Type of Advance Directive  Healthcare Power of [place], Living will  Pre-existing out of facility DNR order (yellow form or pink MOST form)  -  "MOST" Form in Place?  -      TOTAL TIME TAKING CARE OF THIS PATIENT: 38 minutes.    Note: This dictation was prepared with Dragon dictation along with smaller phrase technology. Any transcriptional errors that result from this process are unintentional.  Stacie Knutzen M.D on 07/03/2017 at 8:52 AM  Between 7am to 6pm - Pager - [phone] After 6pm go to [email or web address] - password [email or web address]  Office  [phone]  CC: Primary  care physician; System, Provider Not In

## 2017-07-03 NOTE — Progress Notes (Signed)
Pre HD assessment   07/03/17 0953  Vital Signs  Temp 97.8 F (36.6 C)  Temp Source Oral  Pulse Rate 68  Pulse Rate Source Monitor  Resp 15  BP 106/68  BP Location Right Arm  BP Method Automatic  Patient Position (if appropriate) Lying  Oxygen Therapy  SpO2 100 %  O2 Device Nasal Cannula  O2 Flow Rate (L/min) 4 L/min  Pain Assessment  Pain Scale 0-10  Pain Score 7  Pain Type Acute pain  Pain Location Abdomen  Pain Intervention(s) RN made aware  Multiple Pain Sites Yes  2nd Pain Site  Pain Score 7  Pain Type Acute pain  Pain Location Shoulder  Pain Orientation Left  Pain Intervention(s) RN made aware  Dialysis Weight  Weight 91.4 kg (201 lb 8 oz)  Type of Weight Pre-Dialysis  Time-Out for Hemodialysis  What Procedure? HD  Pt Identifiers(min of two) First/Last Name;MRN/Account#  Correct Site? Yes  Correct Side? Yes  Correct Procedure? Yes  Consents Verified? Yes  Rad Studies Available? N/A  Safety Precautions Reviewed? Yes  Engineer, civil (consulting) Number  (5A)  Station Number 3  UF/Alarm Test Passed  Conductivity: Meter 14  Conductivity: Machine  14.3  pH 7.6  Reverse Osmosis main  Normal Saline Lot Number [id number]  Dialyzer Lot Number [id number]  Disposable Set Lot Number [id number]  Machine Temperature 98.6 F (37 C)  Musician and Audible Yes  Blood Lines Intact and Secured Yes  Pre Treatment Patient Checks  Vascular access used during treatment Fistula  Hepatitis B Surface Antigen Results Negative  Date Hepatitis B Surface Antigen Drawn 06/29/17  Hepatitis B Surface Antibody  (>10)  Date Hepatitis B Surface Antibody Drawn 03/05/17  Hemodialysis Consent Verified Yes  Hemodialysis Standing Orders Initiated Yes  ECG (Telemetry) Monitor On Yes  Prime Ordered Normal Saline  Length of  DialysisTreatment -hour(s) 3.5 Hour(s)  Dialyzer Elisio 17H NR  Dialysate 3K, 2.5 Ca  Dialysis Anticoagulant None  Dialysate Flow Ordered 800  Blood Flow Rate  Ordered 400 mL/min  Ultrafiltration Goal 0.5 Liters  Pre Treatment Labs Phosphorus  Dialysis Blood Pressure Support Ordered Normal Saline  Education / Care Plan  Dialysis Education Provided Yes  Documented Education in Care Plan Yes

## 2017-07-03 NOTE — Progress Notes (Signed)
Post HD assessment   07/03/17 1355  Neurological  Level of Consciousness Alert  Orientation Level Oriented X4  Respiratory  Respiratory Pattern Regular;Unlabored  Chest Assessment Chest expansion symmetrical  Cardiac  ECG Monitor Yes  Antiarrhythmic device No  Vascular  R Radial Pulse +2  L Radial Pulse +2  Integumentary  Integumentary (WDL) X  Skin Color Appropriate for ethnicity  Musculoskeletal  Musculoskeletal (WDL) X  Generalized Weakness Yes  Assistive Device None  GU Assessment  Genitourinary (WDL) X  Genitourinary Symptoms  (HD)  Psychosocial  Psychosocial (WDL) WDL

## 2017-07-03 NOTE — Progress Notes (Signed)
HD tx end    07/03/17 1342  Vital Signs  Pulse Rate 76  Pulse Rate Source Monitor  Resp 14  BP 108/84  BP Location Right Arm  BP Method Automatic  Patient Position (if appropriate) Lying  Oxygen Therapy  SpO2 100 %  O2 Device Nasal Cannula  O2 Flow Rate (L/min) 4 L/min  During Hemodialysis Assessment  Dialysis Fluid Bolus Normal Saline  Bolus Amount (mL) 250 mL  Intra-Hemodialysis Comments Tx completed

## 2017-07-03 NOTE — Progress Notes (Signed)
Post HD assessment. Pt tolerated tx well without c/o or complications. Net UF 508, goal met.    07/03/17 1356  Vital Signs  Temp 97.9 F (36.6 C)  Temp Source Oral  Pulse Rate 82  Pulse Rate Source Monitor  Resp 16  BP 112/80  BP Location Right Arm  BP Method Automatic  Patient Position (if appropriate) Lying  Oxygen Therapy  SpO2 100 %  O2 Device Nasal Cannula  O2 Flow Rate (L/min) 4 L/min  Dialysis Weight  Weight 90.6 kg (199 lb 11.8 oz)  Type of Weight Post-Dialysis  Post-Hemodialysis Assessment  Rinseback Volume (mL) 250 mL  KECN 77.8 V  Dialyzer Clearance Lightly streaked  Duration of HD Treatment -hour(s) 3.5 hour(s)  Hemodialysis Intake (mL) 500 mL  UF Total -Machine (mL) 1008 mL  Net UF (mL) 508 mL  Tolerated HD Treatment Yes  AVG/AVF Arterial Site Held (minutes) 10 minutes  AVG/AVF Venous Site Held (minutes) 10 minutes  Education / Care Plan  Dialysis Education Provided Yes  Documented Education in Care Plan Yes

## 2017-07-06 LAB — CULTURE, BLOOD (ROUTINE X 2)
Culture: NO GROWTH
Culture: NO GROWTH
SPECIAL REQUESTS: ADEQUATE

## 2017-10-30 ENCOUNTER — Encounter: Payer: Self-pay | Admitting: Physical Medicine & Rehabilitation

## 2017-11-10 ENCOUNTER — Encounter: Payer: Self-pay | Admitting: Physical Medicine & Rehabilitation

## 2017-11-10 ENCOUNTER — Encounter
Payer: No Typology Code available for payment source | Attending: Physical Medicine & Rehabilitation | Admitting: Physical Medicine & Rehabilitation

## 2017-11-10 VITALS — BP 97/59 | HR 87 | Ht 74.0 in | Wt 200.0 lb

## 2017-11-10 DIAGNOSIS — Z940 Kidney transplant status: Secondary | ICD-10-CM | POA: Insufficient documentation

## 2017-11-10 DIAGNOSIS — G894 Chronic pain syndrome: Secondary | ICD-10-CM | POA: Diagnosis not present

## 2017-11-10 DIAGNOSIS — Z992 Dependence on renal dialysis: Secondary | ICD-10-CM | POA: Insufficient documentation

## 2017-11-10 DIAGNOSIS — M25512 Pain in left shoulder: Secondary | ICD-10-CM | POA: Insufficient documentation

## 2017-11-10 DIAGNOSIS — M7062 Trochanteric bursitis, left hip: Secondary | ICD-10-CM | POA: Diagnosis not present

## 2017-11-10 DIAGNOSIS — M79673 Pain in unspecified foot: Secondary | ICD-10-CM | POA: Diagnosis not present

## 2017-11-10 DIAGNOSIS — M25561 Pain in right knee: Secondary | ICD-10-CM | POA: Diagnosis not present

## 2017-11-10 DIAGNOSIS — M25562 Pain in left knee: Secondary | ICD-10-CM | POA: Diagnosis not present

## 2017-11-10 DIAGNOSIS — M7522 Bicipital tendinitis, left shoulder: Secondary | ICD-10-CM | POA: Diagnosis not present

## 2017-11-10 DIAGNOSIS — M25551 Pain in right hip: Secondary | ICD-10-CM | POA: Insufficient documentation

## 2017-11-10 DIAGNOSIS — L930 Discoid lupus erythematosus: Secondary | ICD-10-CM

## 2017-11-10 DIAGNOSIS — Z5181 Encounter for therapeutic drug level monitoring: Secondary | ICD-10-CM

## 2017-11-10 DIAGNOSIS — M7061 Trochanteric bursitis, right hip: Secondary | ICD-10-CM | POA: Diagnosis not present

## 2017-11-10 DIAGNOSIS — Z79891 Long term (current) use of opiate analgesic: Secondary | ICD-10-CM | POA: Diagnosis not present

## 2017-11-10 DIAGNOSIS — M25552 Pain in left hip: Secondary | ICD-10-CM | POA: Insufficient documentation

## 2017-11-10 DIAGNOSIS — Z87891 Personal history of nicotine dependence: Secondary | ICD-10-CM | POA: Insufficient documentation

## 2017-11-10 DIAGNOSIS — Z7952 Long term (current) use of systemic steroids: Secondary | ICD-10-CM | POA: Diagnosis not present

## 2017-11-10 DIAGNOSIS — M170 Bilateral primary osteoarthritis of knee: Secondary | ICD-10-CM | POA: Diagnosis not present

## 2017-11-10 DIAGNOSIS — I120 Hypertensive chronic kidney disease with stage 5 chronic kidney disease or end stage renal disease: Secondary | ICD-10-CM | POA: Insufficient documentation

## 2017-11-10 DIAGNOSIS — M7582 Other shoulder lesions, left shoulder: Secondary | ICD-10-CM | POA: Diagnosis not present

## 2017-11-10 DIAGNOSIS — M545 Low back pain: Secondary | ICD-10-CM | POA: Insufficient documentation

## 2017-11-10 DIAGNOSIS — M7581 Other shoulder lesions, right shoulder: Secondary | ICD-10-CM | POA: Diagnosis not present

## 2017-11-10 DIAGNOSIS — Z8249 Family history of ischemic heart disease and other diseases of the circulatory system: Secondary | ICD-10-CM | POA: Insufficient documentation

## 2017-11-10 DIAGNOSIS — N186 End stage renal disease: Secondary | ICD-10-CM | POA: Insufficient documentation

## 2017-11-10 DIAGNOSIS — Z79899 Other long term (current) drug therapy: Secondary | ICD-10-CM | POA: Insufficient documentation

## 2017-11-10 MED ORDER — DICLOFENAC SODIUM 1 % TD GEL: 1.0000 "application " | Freq: Three times a day (TID) | TRANSDERMAL | 4 refills | Status: DC

## 2017-11-10 NOTE — Patient Instructions (Addendum)
PLEASE FEEL FREE TO CALL OUR OFFICE WITH ANY PROBLEMS OR QUESTIONS ([phone])  ONCE I HAVE CONFIRMATION THAT YOUR URINE SPECIMEN IS CONSISTENT WITH YOUR HISTORY AND PRESCRIBED MEDICATIONS, I WILL BE WILLING TO PRESCRIBE YOUR PAIN MEDICATION. THE RESULTS OF YOUR URINE TESTING COULD TAKE A WEEK OR MORE TO RETURN, HOWEVER.  IF WE DO NOT CONTACT YOU REGARDING THESE RESULTS WITHIN 10 DAYS, PLEASE CONTACT us.

## 2017-11-10 NOTE — Progress Notes (Signed)
Subjective:    Patient ID: [name], male    DOB: Jan 04, 1971, 48 y.o.   MRN: [id number]  HPI   This is a 47 year old male with a history of end-stage renal disease on hemodialysis who comes in complaining today of bilateral hip and knee pain as well as left shoulder pain.  History is significant for lupus, diagnosed in 1994. He is followed by rheumatology at the [facility]. He has been on prednisone chronically which has ranged from '5mg'$  daily up to '60mg'$  depending upon a flare. He tells me that his rheumatologist is trying to wean him from the prednisone. He is on '5mg'$  prednisone daily as of a month ago.   He states he's had imaging of his back and hips at the [facility]. He mentions that back surgery was discussed last year but that plan did not proceed as he was hospitalized with an infection which derailed those plans.   He lives in [place] alone. He does yard work and basic up keep for the house. He doesn't perform any specific exercises or stretches because of pain in his hips and shoulders.   Pain is most severe in his hips and shoulders. He tells met that surgery has never been discussed. He also deals with foot and low back pain. Pain worsens with just about any kind of activity.   For pain relief, he will take a shower which provides temporary relief. Heat can help. He was on T#3 before without relief. Icy hot cream was used but did not help. About 1.5 years ago he was on morphine ER and hydrocodone for breakthrough pain. These were stopped when he was hospitalized last year, and they were never restarted. Those medications allowed him to be much more functional although they didn't eliminate the symptoms.   He is on HD and does not make urine. His bowel function is normal.  I coursed available records and found no recent joint or spine imaging. All of the most recent imaging has been performed through the [place]      Average Pain 7 Pain Right Now 7 My pain is aching  In the last 24 hours,  has pain interfered with the following? General activity 7 Relation with others 7 Enjoyment of life 7 What TIME of day is your pain at its worst? morning Sleep (in general) Poor  Pain is worse with: walking and bending Pain improves with: medication Relief from Meds: 9  Mobility walk without assistance how many minutes can you walk? 20 ability to climb steps?  yes do you drive?  yes  Function disabled: date disabled 2002  Neuro/Psych weakness trouble walking  Prior Studies x-rays CT/MRI  Physicians involved in your care Any changes since last visit?  no [facility]   Family History  Problem Relation Age of Onset  . Hypertension Other   . Brain cancer Mother   . Aneurysm Mother   . CAD Father    Social History   Socioeconomic History  . Marital status: Divorced    Spouse name: Not on file  . Number of children: Not on file  . Years of education: Not on file  . Highest education level: Not on file  Occupational History  . Not on file  Social Needs  . Financial resource strain: Not on file  . Food insecurity:    Worry: Not on file    Inability: Not on file  . Transportation needs:    Medical: Not  on file    Non-medical: Not on file  Tobacco Use  . Smoking status: Former Smoker    Packs/day: 0.50  . Smokeless tobacco: Never Used  Substance and Sexual Activity  . Alcohol use: No  . Drug use: No  . Sexual activity: Not on file  Lifestyle  . Physical activity:    Days per week: Not on file    Minutes per session: Not on file  . Stress: Not on file  Relationships  . Social connections:    Talks on phone: Not on file    Gets together: Not on file    Attends religious service: Not on file    Active member of club or organization: Not on file    Attends meetings of clubs or organizations: Not on file    Relationship status: Not on file  Other Topics Concern  . Not on file  Social History Narrative  . Not on  file   Past Surgical History:  Procedure Laterality Date  . NEPHRECTOMY TRANSPLANTED ORGAN     Past Medical History:  Diagnosis Date  . Anemia   . Collagen vascular disease ([place])   . Hypertension   . Lupus ([place])   . Renal disorder   . Renal insufficiency   . Renal transplant recipient    BP (!) 97/59   Pulse 87   Ht '6\' 2"'$  (1.88 m)   Wt 200 lb (90.7 kg)   SpO2 95%   BMI 25.68 kg/m   Opioid Risk Score:   Fall Risk Score:  `1  Depression screen PHQ 2/9  No flowsheet data found.   Review of Systems  Constitutional: Negative.   HENT: Negative.   Eyes: Negative.   Respiratory: Positive for shortness of breath.   Cardiovascular: Negative.   Gastrointestinal: Negative.   Endocrine: Negative.   Genitourinary: Negative.   Musculoskeletal: Negative.   Skin: Negative.   Allergic/Immunologic: Negative.   Neurological: Negative.   Hematological: Negative.   Psychiatric/Behavioral: Negative.   All other systems reviewed and are negative.      Objective:   Physical Exam   General: Alert and oriented x 3, No apparent distress HEENT: Head is normocephalic, atraumatic, PERRLA, EOMI, sclera anicteric, oral mucosa pink and moist, dentition intact, ext ear canals clear,  Neck: Supple without JVD or lymphadenopathy Heart: Reg rate and rhythm. No murmurs rubs or gallops Chest: CTA bilaterally without wheezes, rales, or rhonchi; no distress Abdomen: Soft, non-tender, non-distended, bowel sounds positive. Extremities: No clubbing, cyanosis, or edema. Pulses are 2+ Skin: Clean and intact without signs of breakdown Neuro: Pt is cognitively appropriate with normal insight, memory, and awareness. Cranial nerves 2-12 are intact. Sensory exam is normal. Reflexes are 2+ in all 4's. Fine motor coordination is intact. No tremors. Motor function is grossly 5/5.  Musculoskeletal: right olecranon bursa swollen, sl tender. Both shoulders tender with RTC manuevers, left short head biceps  tendon tender. Both greater trochs tender to palp. +Cross legged maneuver. reasonble low back rom. Knees without effusion. Lateral meniscal signs left more than right. Chronic changes in feet with hyperpronation noted. Psych: Pt's affect is appropriate. Pt is cooperative        Assessment & Plan:  1. Systemic Lupus multiple joint involvement including:  -bilateral shoulders with RTC tendonitis, left biceps tendonitis  -bilateral greater trochanteric bursitis  -inflammatory arthritis of both knees, L>R  -involvement of feet and elbows.   -ESRD on HD    Plan: 1. Increased physical activity 2. HEP.  Consider formal therapies. Reviewed shoulder HEP today and provided handout.  3. Local joint injections to shoulders, hips, and knees could be very helpful in reducing pain  -need most recent imaging---he needs to sign a consent for [facility] of [facility] 4. Will consider narcotic mgt if drug testing is consistent.   -drug swab today  -if drug testing consistent will start oxycodone '5mg'$  q8 prn #60  -consider long acting agent 5. Rheumatological follow up as directed 6. Voltaren gel to bilateral hands, knees, feet TID  Forty-five minutes of face to face patient care time were spent during this visit. All questions were encouraged and answered. Greater than 50% of time during this encounter was spent counseling patient/family in regard to reviewing records with patients, examination, and discussion of customized treatment plan  Follow up in a month.

## 2017-11-13 ENCOUNTER — Telehealth: Payer: Self-pay | Admitting: *Deleted

## 2017-11-13 LAB — DRUG TOX MONITOR 1 W/CONF, ORAL FLD
AMPHETAMINES: NEGATIVE ng/mL (ref <10)
Barbiturates: NEGATIVE ng/mL (ref <10)
Benzodiazepines: NEGATIVE ng/mL (ref <0.50)
Buprenorphine: NEGATIVE ng/mL (ref <0.10)
COCAINE: NEGATIVE ng/mL (ref <5.0)
COTININE: 38 ng/mL — AB (ref <5.0)
FENTANYL: NEGATIVE ng/mL (ref <0.10)
HEROIN METABOLITE: NEGATIVE ng/mL (ref <1.0)
MARIJUANA: NEGATIVE ng/mL (ref <2.5)
MDMA: NEGATIVE ng/mL (ref <10)
Meprobamate: NEGATIVE ng/mL (ref <2.5)
Methadone: NEGATIVE ng/mL (ref <5.0)
NICOTINE METABOLITE: POSITIVE ng/mL — AB (ref <5.0)
Opiates: NEGATIVE ng/mL (ref <2.5)
Phencyclidine: NEGATIVE ng/mL (ref <10)
TAPENTADOL: NEGATIVE ng/mL (ref <5.0)
Tramadol: NEGATIVE ng/mL (ref <5.0)
Zolpidem: NEGATIVE ng/mL (ref <5.0)

## 2017-11-13 LAB — DRUG TOX ALC METAB W/CON, ORAL FLD: Alcohol Metabolite: NEGATIVE ng/mL (ref <25)

## 2017-11-13 MED ORDER — OXYCODONE HCL 5 MG PO TABS: 5.0000 mg | ORAL_TABLET | Freq: Three times a day (TID) | ORAL | 0 refills | Status: DC | PRN

## 2017-11-13 NOTE — Telephone Encounter (Signed)
rx sent to pharmacy

## 2017-11-13 NOTE — Telephone Encounter (Signed)
Oral swab is appropriately negative for any controlled medications. Per your note you planned to prescribe if appropriate. "-if drug testing consistent will start oxycodone 5mg  q8 prn #60"

## 2017-12-10 ENCOUNTER — Telehealth: Payer: Self-pay

## 2017-12-10 MED ORDER — OXYCODONE HCL 5 MG PO TABS: 5.0000 mg | ORAL_TABLET | Freq: Three times a day (TID) | ORAL | 0 refills | Status: DC | PRN

## 2017-12-10 NOTE — Telephone Encounter (Signed)
Pt called requesting a refill for Oxycodone. Last filled 11/13/17 according to PMP aware and pharmacy. Pharmacy does not have a prescription on file. Next appt 12/29/17. [facility].

## 2017-12-10 NOTE — Telephone Encounter (Signed)
Medication refilled

## 2017-12-10 NOTE — Telephone Encounter (Signed)
Pt.notified

## 2017-12-29 ENCOUNTER — Encounter: Payer: Self-pay | Admitting: Physical Medicine & Rehabilitation

## 2017-12-29 ENCOUNTER — Encounter
Payer: No Typology Code available for payment source | Attending: Physical Medicine & Rehabilitation | Admitting: Physical Medicine & Rehabilitation

## 2017-12-29 VITALS — BP 94/57 | HR 79 | Resp 14 | Ht 74.0 in | Wt 199.0 lb

## 2017-12-29 DIAGNOSIS — Z5181 Encounter for therapeutic drug level monitoring: Secondary | ICD-10-CM | POA: Diagnosis not present

## 2017-12-29 DIAGNOSIS — Z79891 Long term (current) use of opiate analgesic: Secondary | ICD-10-CM | POA: Insufficient documentation

## 2017-12-29 DIAGNOSIS — M7062 Trochanteric bursitis, left hip: Secondary | ICD-10-CM | POA: Insufficient documentation

## 2017-12-29 DIAGNOSIS — M7061 Trochanteric bursitis, right hip: Secondary | ICD-10-CM | POA: Diagnosis not present

## 2017-12-29 DIAGNOSIS — M25561 Pain in right knee: Secondary | ICD-10-CM | POA: Diagnosis not present

## 2017-12-29 DIAGNOSIS — N186 End stage renal disease: Secondary | ICD-10-CM | POA: Diagnosis not present

## 2017-12-29 DIAGNOSIS — M7522 Bicipital tendinitis, left shoulder: Secondary | ICD-10-CM | POA: Diagnosis not present

## 2017-12-29 DIAGNOSIS — Z992 Dependence on renal dialysis: Secondary | ICD-10-CM | POA: Insufficient documentation

## 2017-12-29 DIAGNOSIS — G894 Chronic pain syndrome: Secondary | ICD-10-CM | POA: Diagnosis present

## 2017-12-29 DIAGNOSIS — M545 Low back pain: Secondary | ICD-10-CM | POA: Insufficient documentation

## 2017-12-29 DIAGNOSIS — Z940 Kidney transplant status: Secondary | ICD-10-CM | POA: Insufficient documentation

## 2017-12-29 DIAGNOSIS — M7581 Other shoulder lesions, right shoulder: Secondary | ICD-10-CM | POA: Insufficient documentation

## 2017-12-29 DIAGNOSIS — M25552 Pain in left hip: Secondary | ICD-10-CM | POA: Diagnosis not present

## 2017-12-29 DIAGNOSIS — M25512 Pain in left shoulder: Secondary | ICD-10-CM | POA: Insufficient documentation

## 2017-12-29 DIAGNOSIS — M7582 Other shoulder lesions, left shoulder: Secondary | ICD-10-CM | POA: Diagnosis not present

## 2017-12-29 DIAGNOSIS — I120 Hypertensive chronic kidney disease with stage 5 chronic kidney disease or end stage renal disease: Secondary | ICD-10-CM | POA: Insufficient documentation

## 2017-12-29 DIAGNOSIS — M79673 Pain in unspecified foot: Secondary | ICD-10-CM | POA: Insufficient documentation

## 2017-12-29 DIAGNOSIS — M25562 Pain in left knee: Secondary | ICD-10-CM | POA: Diagnosis not present

## 2017-12-29 DIAGNOSIS — M170 Bilateral primary osteoarthritis of knee: Secondary | ICD-10-CM | POA: Insufficient documentation

## 2017-12-29 DIAGNOSIS — Z8249 Family history of ischemic heart disease and other diseases of the circulatory system: Secondary | ICD-10-CM | POA: Diagnosis not present

## 2017-12-29 DIAGNOSIS — Z7952 Long term (current) use of systemic steroids: Secondary | ICD-10-CM | POA: Insufficient documentation

## 2017-12-29 DIAGNOSIS — Z79899 Other long term (current) drug therapy: Secondary | ICD-10-CM | POA: Insufficient documentation

## 2017-12-29 DIAGNOSIS — Z87891 Personal history of nicotine dependence: Secondary | ICD-10-CM | POA: Diagnosis not present

## 2017-12-29 DIAGNOSIS — L930 Discoid lupus erythematosus: Secondary | ICD-10-CM | POA: Diagnosis not present

## 2017-12-29 DIAGNOSIS — M25551 Pain in right hip: Secondary | ICD-10-CM | POA: Diagnosis not present

## 2017-12-29 MED ORDER — OXYCODONE HCL 5 MG PO TABS: 5.0000 mg | ORAL_TABLET | Freq: Four times a day (QID) | ORAL | 0 refills | Status: DC | PRN

## 2017-12-29 NOTE — Patient Instructions (Addendum)
[facility] NEED TO CALL [facility] [place] AND CONTACT RADIOLOGY. ASK FOR XRAYS, MRI REPORTS OF YOUR HIPS, KNEES, SHOULDERS. THEY CAN BE FAXED TO OUR OFFICE [phone]

## 2017-12-29 NOTE — Progress Notes (Signed)
Subjective:    Patient ID: [name], male    DOB: [birth year], 47 y.o.   MRN: [id number]  HPI   [name] is here in follow up of his chronic pain. We started him on oxycodone at last visit, but he's only getting relief for 3-4 hours. He hasn't been able to acquire any imaging from the [facility] hospital. He continues to have shoulder, hip, and elbow pain which affect his every day living activities.    He has developed cysts/boyles on his face, trunk, and legs which he's taking antibiotics. He seems to tie these to his HD but has never been given an in depth explanation as to why they're actually happening  Pain Inventory Average Pain 7 Pain Right Now 7 My pain is constant and aching  In the last 24 hours, has pain interfered with the following? General activity 7 Relation with others 7 Enjoyment of life 7 What TIME of day is your pain at its worst? morning Sleep (in general) Poor  Pain is worse with: walking and standing Pain improves with: medication Relief from Meds: 7  Mobility walk without assistance how many minutes can you walk? 20 ability to climb steps?  yes do you drive?  yes Do you have any goals in this area?  yes  Function retired  Neuro/Psych weakness trouble walking  Prior Studies Any changes since last visit?  no  Physicians involved in your care Any changes since last visit?  no   Family History  Problem Relation Age of Onset  . Hypertension Other   . Brain cancer Mother   . Aneurysm Mother   . CAD Father    Social History   Socioeconomic History  . Marital status: Divorced    Spouse name: Not on file  . Number of children: Not on file  . Years of education: Not on file  . Highest education level: Not on file  Occupational History  . Not on file  Social Needs  . Financial resource strain: Not on file  . Food insecurity:    Worry: Not on file    Inability: Not on file  . Transportation needs:    Medical: Not on file   Non-medical: Not on file  Tobacco Use  . Smoking status: Former Smoker    Packs/day: 0.50  . Smokeless tobacco: Never Used  Substance and Sexual Activity  . Alcohol use: No  . Drug use: No  . Sexual activity: Not on file  Lifestyle  . Physical activity:    Days per week: Not on file    Minutes per session: Not on file  . Stress: Not on file  Relationships  . Social connections:    Talks on phone: Not on file    Gets together: Not on file    Attends religious service: Not on file    Active member of club or organization: Not on file    Attends meetings of clubs or organizations: Not on file    Relationship status: Not on file  Other Topics Concern  . Not on file  Social History Narrative  . Not on file   Past Surgical History:  Procedure Laterality Date  . NEPHRECTOMY TRANSPLANTED ORGAN     Past Medical History:  Diagnosis Date  . Anemia   . Collagen vascular disease ([place])   . Hypertension   . Lupus ([place])   . Renal disorder   . Renal insufficiency   . Renal transplant recipient  BP (!) 94/57   Pulse 79   Resp 14   Ht 6\' 2"  (1.88 m)   Wt 199 lb (90.3 kg)   SpO2 90%   BMI 25.55 kg/m   Opioid Risk Score:   Fall Risk Score:  `1  Depression screen PHQ 2/9  No flowsheet data found.  Review of Systems  Constitutional: Negative.   HENT: Negative.   Eyes: Negative.   Respiratory: Negative.   Cardiovascular: Negative.   Gastrointestinal: Positive for nausea.  Endocrine: Negative.   Genitourinary: Negative.   Musculoskeletal: Positive for arthralgias and gait problem.  Skin: Negative.   Allergic/Immunologic: Negative.   Neurological: Positive for weakness.  Psychiatric/Behavioral: Negative.   All other systems reviewed and are negative.      Objective:   Physical Exam General: No acute distress HEENT: EOMI, oral membranes moist Cards: reg rate  Chest: normal effort Abdomen: Soft, NT, ND Skin: dry, intact Extremities: no edema Neuro: Pt is  cognitively appropriate with normal insight, memory, and awareness. Cranial nerves 2-12 are intact. Sensory exam is normal. Reflexes are 2+ in all 4's. Fine motor coordination is intact. No tremors. Motor function is grossly 5/5.  Musculoskeletal: right olecranon bursa remender tender. Both shoulders tender with RTC manuevers, left short head biceps tendon remains tender. Both greater trochs still tender to palp. Low back not overly tender. Knees without effusion. Lateral meniscal signs left more than right. hyperpronated feet Psych: Pt's affect is appropriate. Pt is cooperative        Assessment & Plan:  1. Systemic Lupus multiple joint involvement including:             -bilateral shoulders with RTC tendonitis, left biceps tendonitis             -bilateral greater trochanteric bursitis             -inflammatory arthritis of both knees, L>R             -involvement of feet and elbows.              -ESRD on HD    Plan: 1. Asked me about skin lesions, deferred to dermatologist. Needs to understand what's causing them to avoid getting them.  2. HEP. Consider formal therapies. Reviewed shoulder HEP today and provided handout.  3. Local joint injections to shoulders, hips, and knees could be very helpful in reducing pain             -need most recent imaging---he needs to sign [facility]---reviewed that with him again today 4. Increase oxycodone to #90, q6 prn, consider long acting agent  -We will initiate the controlled substance monitoring program, this consists of regular clinic visits, examinations, routine drug screening, pill counts as well as use of [place] Controlled Substance Reporting System. [place] was reviewed today.    -drug swab today 5. Rheumatological follow up as directed 6. Voltaren gel to bilateral hands, knees, feet TID. Asked him to use on a scheduled basis  Fifteen minutes of face to face patient care time were spent during  this visit. All questions were encouraged and answered.  Follow up in a month with NP.

## 2018-01-22 NOTE — Addendum Note (Signed)
Addended by: Caro Hight on: 01/22/2018 08:49 AM   Modules accepted: Orders

## 2018-02-02 ENCOUNTER — Encounter
Payer: No Typology Code available for payment source | Attending: Physical Medicine & Rehabilitation | Admitting: Registered Nurse

## 2018-02-02 ENCOUNTER — Encounter: Payer: Self-pay | Admitting: Registered Nurse

## 2018-02-02 VITALS — BP 93/60 | HR 89 | Ht 74.0 in | Wt 200.0 lb

## 2018-02-02 DIAGNOSIS — N186 End stage renal disease: Secondary | ICD-10-CM | POA: Insufficient documentation

## 2018-02-02 DIAGNOSIS — Z79891 Long term (current) use of opiate analgesic: Secondary | ICD-10-CM | POA: Diagnosis not present

## 2018-02-02 DIAGNOSIS — M25551 Pain in right hip: Secondary | ICD-10-CM | POA: Diagnosis not present

## 2018-02-02 DIAGNOSIS — I120 Hypertensive chronic kidney disease with stage 5 chronic kidney disease or end stage renal disease: Secondary | ICD-10-CM | POA: Diagnosis not present

## 2018-02-02 DIAGNOSIS — M25512 Pain in left shoulder: Secondary | ICD-10-CM | POA: Diagnosis not present

## 2018-02-02 DIAGNOSIS — Z8249 Family history of ischemic heart disease and other diseases of the circulatory system: Secondary | ICD-10-CM | POA: Diagnosis not present

## 2018-02-02 DIAGNOSIS — Z7952 Long term (current) use of systemic steroids: Secondary | ICD-10-CM | POA: Insufficient documentation

## 2018-02-02 DIAGNOSIS — M545 Low back pain: Secondary | ICD-10-CM | POA: Diagnosis not present

## 2018-02-02 DIAGNOSIS — M7061 Trochanteric bursitis, right hip: Secondary | ICD-10-CM | POA: Diagnosis not present

## 2018-02-02 DIAGNOSIS — M7522 Bicipital tendinitis, left shoulder: Secondary | ICD-10-CM | POA: Insufficient documentation

## 2018-02-02 DIAGNOSIS — M79673 Pain in unspecified foot: Secondary | ICD-10-CM | POA: Insufficient documentation

## 2018-02-02 DIAGNOSIS — Z940 Kidney transplant status: Secondary | ICD-10-CM | POA: Diagnosis not present

## 2018-02-02 DIAGNOSIS — Z992 Dependence on renal dialysis: Secondary | ICD-10-CM | POA: Diagnosis not present

## 2018-02-02 DIAGNOSIS — M25552 Pain in left hip: Secondary | ICD-10-CM | POA: Insufficient documentation

## 2018-02-02 DIAGNOSIS — M170 Bilateral primary osteoarthritis of knee: Secondary | ICD-10-CM | POA: Insufficient documentation

## 2018-02-02 DIAGNOSIS — M25561 Pain in right knee: Secondary | ICD-10-CM | POA: Insufficient documentation

## 2018-02-02 DIAGNOSIS — G894 Chronic pain syndrome: Secondary | ICD-10-CM | POA: Insufficient documentation

## 2018-02-02 DIAGNOSIS — Z5181 Encounter for therapeutic drug level monitoring: Secondary | ICD-10-CM | POA: Insufficient documentation

## 2018-02-02 DIAGNOSIS — M25562 Pain in left knee: Secondary | ICD-10-CM | POA: Insufficient documentation

## 2018-02-02 DIAGNOSIS — L930 Discoid lupus erythematosus: Secondary | ICD-10-CM | POA: Diagnosis not present

## 2018-02-02 DIAGNOSIS — M7582 Other shoulder lesions, left shoulder: Secondary | ICD-10-CM | POA: Diagnosis not present

## 2018-02-02 DIAGNOSIS — Z79899 Other long term (current) drug therapy: Secondary | ICD-10-CM | POA: Insufficient documentation

## 2018-02-02 DIAGNOSIS — M7581 Other shoulder lesions, right shoulder: Secondary | ICD-10-CM | POA: Insufficient documentation

## 2018-02-02 DIAGNOSIS — M7062 Trochanteric bursitis, left hip: Secondary | ICD-10-CM | POA: Diagnosis not present

## 2018-02-02 DIAGNOSIS — Z87891 Personal history of nicotine dependence: Secondary | ICD-10-CM | POA: Insufficient documentation

## 2018-02-02 MED ORDER — HYDROCODONE-ACETAMINOPHEN 10-325 MG PO TABS: 1.0000 | ORAL_TABLET | Freq: Three times a day (TID) | ORAL | 0 refills | Status: DC | PRN

## 2018-02-02 NOTE — Progress Notes (Signed)
Subjective:    Patient ID: [name], male    DOB: Jun 29, 1970, 48 y.o.   MRN: [id number]  HPI: [name] is a 48 y.o. male who returns for follow up appointment for chronic pain and medication refill. He states his  pain is located in his left shoulder and bilateral hips. He rates his pain 6.  His current exercise regime is walking and performing stretching exercises.  [name] reports he's sluggish when he takes hisOxycodone, PMP reviewed he was prescribed Hydrocodone in the past. We will prescribe Hydrocodone, he verbalizes understanding.   [name] Morphine equivalent is 29.35 MME.  Last Oral Swab was performed on 11/10/2017, it was consistent.   Pain Inventory Average Pain 8 Pain Right Now 6 My pain is sharp and aching  In the last 24 hours, has pain interfered with the following? General activity 6 Relation with others 0 Enjoyment of life 4 What TIME of day is your pain at its worst? morning Sleep (in general) Poor  Pain is worse with: walking and some activites Pain improves with: medication Relief from Meds: 5  Mobility walk without assistance ability to climb steps?  yes do you drive?  yes  Function retired  Neuro/Psych weakness  Prior Studies Any changes since last visit?  no  Physicians involved in your care Any changes since last visit?  no   Family History  Problem Relation Age of Onset  . Hypertension Other   . Brain cancer Mother   . Aneurysm Mother   . CAD Father    Social History   Socioeconomic History  . Marital status: Divorced    Spouse name: Not on file  . Number of children: Not on file  . Years of education: Not on file  . Highest education level: Not on file  Occupational History  . Not on file  Social Needs  . Financial resource strain: Not on file  . Food insecurity:    Worry: Not on file    Inability: Not on file  . Transportation needs:    Medical: Not on file    Non-medical: Not on file  Tobacco Use  .  Smoking status: Former Smoker    Packs/day: 0.50  . Smokeless tobacco: Never Used  Substance and Sexual Activity  . Alcohol use: No  . Drug use: No  . Sexual activity: Not on file  Lifestyle  . Physical activity:    Days per week: Not on file    Minutes per session: Not on file  . Stress: Not on file  Relationships  . Social connections:    Talks on phone: Not on file    Gets together: Not on file    Attends religious service: Not on file    Active member of club or organization: Not on file    Attends meetings of clubs or organizations: Not on file    Relationship status: Not on file  Other Topics Concern  . Not on file  Social History Narrative  . Not on file   Past Surgical History:  Procedure Laterality Date  . NEPHRECTOMY TRANSPLANTED ORGAN     Past Medical History:  Diagnosis Date  . Anemia   . Collagen vascular disease ([place])   . Hypertension   . Lupus ([place])   . Renal disorder   . Renal insufficiency   . Renal transplant recipient    BP 93/60   Ht 6\' 2"  (1.88 m)   Wt 200 lb (90.7  kg)   BMI 25.68 kg/m   Opioid Risk Score:   Fall Risk Score:  `1  Depression screen PHQ 2/9  No flowsheet data found.   Review of Systems  Constitutional: Negative.   HENT: Negative.   Eyes: Negative.   Respiratory: Positive for shortness of breath.   Cardiovascular: Negative.   Gastrointestinal: Negative.   Endocrine: Negative.   Genitourinary: Negative.   Musculoskeletal: Positive for arthralgias and myalgias.  Skin: Negative.   Allergic/Immunologic: Negative.   Neurological: Positive for weakness.  Hematological: Negative.   Psychiatric/Behavioral: Negative.   All other systems reviewed and are negative.      Objective:   Physical Exam Vitals signs and nursing note reviewed.  Constitutional:      Appearance: Normal appearance.  Neck:     Musculoskeletal: Normal range of motion and neck supple.  Cardiovascular:     Rate and Rhythm: Regular rhythm.      Pulses: Normal pulses.     Heart sounds: Normal heart sounds.  Pulmonary:     Effort: Pulmonary effort is normal.     Breath sounds: Normal breath sounds.  Musculoskeletal:     Comments: Normal Muscle Bulk and Muscle Testing Reveals:  Upper Extremities: Full ROM and Muscle Strength 5/5  Lower Extremities: Full ROM and Muscle Strength 5/5 Arises from chair with ease Narrow Based Gait   Skin:    General: Skin is warm and dry.  Neurological:     Mental Status: He is alert and oriented to person, place, and time.  Psychiatric:        Mood and Affect: Mood normal.        Behavior: Behavior normal.           Assessment & Plan:  1. Systemic Lupus Multiple Joint Involvement/ ESRD: Continue HEP as Tolerated  Rheumatology and Nephrology Following.  2. Tendonitis of both rotator cuffs: Continue HEP as Tolerated. Continue to Monitor.  3. Chronic Pain Syndrome: RX: Hydrocodone 10/325 mg one table every 8 hours as needed for pain. #90. Continue Voltaren Gel  We will continue the opioid monitoring program, this consists of regular clinic visits, examinations, urine drug screen, pill counts as well as use of [place] Controlled Substance Reporting system.  20 minutes of face to face patient care time was spent during this visit. All questions were encouraged and answered.  F/U in 1 month

## 2018-02-06 LAB — DRUG TOX MONITOR 1 W/CONF, ORAL FLD
Amphetamines: NEGATIVE ng/mL (ref <10)
BARBITURATES: NEGATIVE ng/mL (ref <10)
BENZODIAZEPINES: NEGATIVE ng/mL (ref <0.50)
BUPRENORPHINE: NEGATIVE ng/mL (ref <0.10)
COCAINE: NEGATIVE ng/mL (ref <5.0)
CODEINE: NEGATIVE ng/mL (ref <2.5)
Cotinine: 98.6 ng/mL — ABNORMAL HIGH (ref <5.0)
DIHYDROCODEINE: NEGATIVE ng/mL (ref <2.5)
FENTANYL: NEGATIVE ng/mL (ref <0.10)
HYDROCODONE: NEGATIVE ng/mL (ref <2.5)
Heroin Metabolite: NEGATIVE ng/mL (ref <1.0)
Hydromorphone: NEGATIVE ng/mL (ref <2.5)
MARIJUANA: NEGATIVE ng/mL (ref <2.5)
MDMA: NEGATIVE ng/mL (ref <10)
Meprobamate: NEGATIVE ng/mL (ref <2.5)
Methadone: NEGATIVE ng/mL (ref <5.0)
Morphine: NEGATIVE ng/mL (ref <2.5)
NICOTINE METABOLITE: POSITIVE ng/mL — AB (ref <5.0)
NORHYDROCODONE: NEGATIVE ng/mL (ref <2.5)
Noroxycodone: 31.1 ng/mL — ABNORMAL HIGH (ref <2.5)
OXYMORPHONE: NEGATIVE ng/mL (ref <2.5)
Opiates: POSITIVE ng/mL — AB (ref <2.5)
Oxycodone: 91.2 ng/mL — ABNORMAL HIGH (ref <2.5)
Phencyclidine: NEGATIVE ng/mL (ref <10)
Tapentadol: NEGATIVE ng/mL (ref <5.0)
Tramadol: NEGATIVE ng/mL (ref <5.0)
ZOLPIDEM: NEGATIVE ng/mL (ref <5.0)

## 2018-02-06 LAB — DRUG TOX ALC METAB W/CON, ORAL FLD: ALCOHOL METABOLITE: NEGATIVE ng/mL (ref <25)

## 2018-02-08 ENCOUNTER — Telehealth: Payer: Self-pay | Admitting: *Deleted

## 2018-02-08 NOTE — Telephone Encounter (Signed)
Oral swab drug screen was consistent for prescribed medications.  ?

## 2018-03-02 ENCOUNTER — Encounter: Payer: Self-pay | Admitting: Registered Nurse

## 2018-03-02 ENCOUNTER — Encounter
Payer: No Typology Code available for payment source | Attending: Physical Medicine & Rehabilitation | Admitting: Registered Nurse

## 2018-03-02 VITALS — BP 113/62 | HR 87 | Ht 74.0 in | Wt 206.0 lb

## 2018-03-02 DIAGNOSIS — Z79891 Long term (current) use of opiate analgesic: Secondary | ICD-10-CM

## 2018-03-02 DIAGNOSIS — M25562 Pain in left knee: Secondary | ICD-10-CM | POA: Diagnosis not present

## 2018-03-02 DIAGNOSIS — M79673 Pain in unspecified foot: Secondary | ICD-10-CM | POA: Diagnosis not present

## 2018-03-02 DIAGNOSIS — G894 Chronic pain syndrome: Secondary | ICD-10-CM | POA: Diagnosis not present

## 2018-03-02 DIAGNOSIS — Z8249 Family history of ischemic heart disease and other diseases of the circulatory system: Secondary | ICD-10-CM | POA: Diagnosis not present

## 2018-03-02 DIAGNOSIS — M545 Low back pain: Secondary | ICD-10-CM | POA: Diagnosis not present

## 2018-03-02 DIAGNOSIS — M25552 Pain in left hip: Secondary | ICD-10-CM | POA: Insufficient documentation

## 2018-03-02 DIAGNOSIS — M546 Pain in thoracic spine: Secondary | ICD-10-CM

## 2018-03-02 DIAGNOSIS — M7061 Trochanteric bursitis, right hip: Secondary | ICD-10-CM | POA: Diagnosis not present

## 2018-03-02 DIAGNOSIS — Z5181 Encounter for therapeutic drug level monitoring: Secondary | ICD-10-CM

## 2018-03-02 DIAGNOSIS — I120 Hypertensive chronic kidney disease with stage 5 chronic kidney disease or end stage renal disease: Secondary | ICD-10-CM | POA: Insufficient documentation

## 2018-03-02 DIAGNOSIS — G8929 Other chronic pain: Secondary | ICD-10-CM

## 2018-03-02 DIAGNOSIS — M170 Bilateral primary osteoarthritis of knee: Secondary | ICD-10-CM | POA: Insufficient documentation

## 2018-03-02 DIAGNOSIS — M25561 Pain in right knee: Secondary | ICD-10-CM | POA: Insufficient documentation

## 2018-03-02 DIAGNOSIS — M25551 Pain in right hip: Secondary | ICD-10-CM | POA: Insufficient documentation

## 2018-03-02 DIAGNOSIS — Z940 Kidney transplant status: Secondary | ICD-10-CM | POA: Diagnosis not present

## 2018-03-02 DIAGNOSIS — M25512 Pain in left shoulder: Secondary | ICD-10-CM | POA: Insufficient documentation

## 2018-03-02 DIAGNOSIS — M7581 Other shoulder lesions, right shoulder: Secondary | ICD-10-CM | POA: Diagnosis not present

## 2018-03-02 DIAGNOSIS — Z992 Dependence on renal dialysis: Secondary | ICD-10-CM | POA: Diagnosis not present

## 2018-03-02 DIAGNOSIS — Z7952 Long term (current) use of systemic steroids: Secondary | ICD-10-CM | POA: Diagnosis not present

## 2018-03-02 DIAGNOSIS — M7582 Other shoulder lesions, left shoulder: Secondary | ICD-10-CM | POA: Insufficient documentation

## 2018-03-02 DIAGNOSIS — N186 End stage renal disease: Secondary | ICD-10-CM | POA: Diagnosis not present

## 2018-03-02 DIAGNOSIS — M7062 Trochanteric bursitis, left hip: Secondary | ICD-10-CM | POA: Insufficient documentation

## 2018-03-02 DIAGNOSIS — L930 Discoid lupus erythematosus: Secondary | ICD-10-CM | POA: Diagnosis not present

## 2018-03-02 DIAGNOSIS — M2550 Pain in unspecified joint: Secondary | ICD-10-CM

## 2018-03-02 DIAGNOSIS — Z87891 Personal history of nicotine dependence: Secondary | ICD-10-CM | POA: Insufficient documentation

## 2018-03-02 DIAGNOSIS — M7522 Bicipital tendinitis, left shoulder: Secondary | ICD-10-CM | POA: Diagnosis not present

## 2018-03-02 DIAGNOSIS — Z79899 Other long term (current) drug therapy: Secondary | ICD-10-CM | POA: Insufficient documentation

## 2018-03-02 MED ORDER — HYDROCODONE-ACETAMINOPHEN 10-325 MG PO TABS: 1.0000 | ORAL_TABLET | Freq: Three times a day (TID) | ORAL | 0 refills | Status: DC | PRN

## 2018-03-02 NOTE — Progress Notes (Signed)
Subjective:    Patient ID: [name], male    DOB: 1970/09/23, 48 y.o.   MRN: [id number]  HPI: [name] is a 48 y.o. male who returns for follow up appointment for chronic pain and medication refill. He states his pain is located in his upper back, bilateral hips and reports joint pain all over especially with weather changes. He rates his pain 8. His current exercise regime is walking and performing stretching exercises.  [name] Morphine equivalent is 30.00  MME.  Last Oral Swab was Performed on 02/02/2018, it was consistent.    Pain Inventory Average Pain 6 Pain Right Now 8 My pain is aching  In the last 24 hours, has pain interfered with the following? General activity 5 Relation with others 0 Enjoyment of life 3 What TIME of day is your pain at its worst? morning Sleep (in general) Poor  Pain is worse with: walking, bending and standing Pain improves with: medication Relief from Meds: 5  Mobility walk without assistance how many minutes can you walk? 5 ability to climb steps?  no do you drive?  yes Do you have any goals in this area?  yes  Function retired Do you have any goals in this area?  yes  Neuro/Psych trouble walking  Prior Studies Any changes since last visit?  no  Physicians involved in your care Any changes since last visit?  no   Family History  Problem Relation Age of Onset  . Hypertension Other   . Brain cancer Mother   . Aneurysm Mother   . CAD Father    Social History   Socioeconomic History  . Marital status: Divorced    Spouse name: Not on file  . Number of children: Not on file  . Years of education: Not on file  . Highest education level: Not on file  Occupational History  . Not on file  Social Needs  . Financial resource strain: Not on file  . Food insecurity:    Worry: Not on file    Inability: Not on file  . Transportation needs:    Medical: Not on file    Non-medical: Not on file  Tobacco Use  .  Smoking status: Former Smoker    Packs/day: 0.50  . Smokeless tobacco: Never Used  Substance and Sexual Activity  . Alcohol use: No  . Drug use: No  . Sexual activity: Not on file  Lifestyle  . Physical activity:    Days per week: Not on file    Minutes per session: Not on file  . Stress: Not on file  Relationships  . Social connections:    Talks on phone: Not on file    Gets together: Not on file    Attends religious service: Not on file    Active member of club or organization: Not on file    Attends meetings of clubs or organizations: Not on file    Relationship status: Not on file  Other Topics Concern  . Not on file  Social History Narrative  . Not on file   Past Surgical History:  Procedure Laterality Date  . NEPHRECTOMY TRANSPLANTED ORGAN     Past Medical History:  Diagnosis Date  . Anemia   . Collagen vascular disease ([place])   . Hypertension   . Lupus ([place])   . Renal disorder   . Renal insufficiency   . Renal transplant recipient    BP 113/62   Pulse 87  Ht 6\' 2"  (1.88 m)   Wt 206 lb (93.4 kg)   SpO2 91%   BMI 26.45 kg/m   Opioid Risk Score:   Fall Risk Score:  `1  Depression screen PHQ 2/9  No flowsheet data found.  Review of Systems  Constitutional: Negative.   HENT: Negative.   Eyes: Negative.   Respiratory: Negative.   Cardiovascular: Negative.   Gastrointestinal: Negative.   Endocrine: Negative.   Genitourinary: Positive for difficulty urinating.       Dialysis  Musculoskeletal: Positive for arthralgias, back pain and gait problem.  Skin: Negative.   Allergic/Immunologic: Negative.   Hematological: Negative.   Psychiatric/Behavioral: Negative.   All other systems reviewed and are negative.      Objective:   Physical Exam Vitals signs and nursing note reviewed.  Constitutional:      Appearance: Normal appearance.  Neck:     Musculoskeletal: Normal range of motion and neck supple.  Cardiovascular:     Rate and Rhythm: Normal  rate and regular rhythm.     Pulses: Normal pulses.     Heart sounds: Normal heart sounds.  Pulmonary:     Effort: Pulmonary effort is normal.     Breath sounds: Normal breath sounds.  Musculoskeletal:     Comments: Normal Muscle Bulk and Muscle Testing Reveals:  Upper Extremities: Full ROM and Muscle Strength 5/5 Bilateral AC Joint Tenderness  Thoracic Paraspinal Tenderness: T-1-T-3 T-7-T-9 Lower Extremities: Full ROM and Muscle Strength 5/5 Arises from Table with ease Narrow Based  Gait   Skin:    General: Skin is warm and dry.  Neurological:     Mental Status: He is alert and oriented to person, place, and time.  Psychiatric:        Mood and Affect: Mood normal.        Behavior: Behavior normal.           Assessment & Plan:  1. Systemic Lupus Multiple Joint Involvement/ ESRD: Continue HEP as Tolerated . Left AVF + thrill. 03/02/2018 Rheumatology and Nephrology Following.  2. Tendonitis of both rotator cuffs: Continue HEP as Tolerated. Continue to Monitor. 03/02/2018 3. Chronic Pain Syndrome: Refilled: Hydrocodone 10/325 mg one table every 8 hours as needed for pain. #90. Continue Voltaren Gel . 03/02/2018 We will continue the opioid monitoring program, this consists of regular clinic visits, examinations, urine drug screen, pill counts as well as use of [place] Controlled Substance Reporting system. 4. Polyarthralgia: Continue to Monitor.  5. Greater Trochanter Bursitis: Continue to Alternate Ice and Heat Therapy. Continue to Monitor.  6. Chronic Bilateral Knee Pain: Continue HEP as Tolerated and Continue to Monitor.  7. Chronic Bilateral Thoracic Pain: Continue current medication regimen. Continue to monitor.   20 minutes of face to face patient care time was spent during this visit. All questions were encouraged and answered.  F/U in 1 month

## 2018-03-24 NOTE — Progress Notes (Signed)
Patient referred by [facility], [facility] for pulmonary hypertension  Subjective:   [name], male    DOB: Dec 30, 1970, 48 y.o.   MRN: [id number]   Chief Complaint  Patient presents with  . Pre-op Exam    NP Eval    HPI  48 year old African-American male with end-stage renal disease due to SLE, on hemodialysis, history of kidney transplant in 2009, now awaiting repeat kidney transplant due to advanced transplant kidney disease, chronic pain, history of GI bleed.  Patient lives in [place] by himself.  He lives fairly sedentary lifestyle.  He works outside in the yard and on cars during [place].  With his level of activity, he denies any chest pain, shortness of breath, leg edema.  He tells me that he previously had pulmonary hypertension, but it has since resolved.  Similarly, he had anemia in 2019 thought to be due to SLE. Recent hemoglobin as per patient is 11.  He previously had pretransplant cardiac work-up in 2009 at the [place].   Past Medical History:  Diagnosis Date  . Anemia   . Collagen vascular disease ([place])   . Lupus ([place])   . Renal disorder   . Renal insufficiency   . Renal transplant recipient      Past Surgical History:  Procedure Laterality Date  . NEPHRECTOMY TRANSPLANTED ORGAN       Social History   Socioeconomic History  . Marital status: Divorced    Spouse name: Not on file  . Number of children: 2  . Years of education: Not on file  . Highest education level: Not on file  Occupational History  . Not on file  Social Needs  . Financial resource strain: Not on file  . Food insecurity:    Worry: Not on file    Inability: Not on file  . Transportation needs:    Medical: Not on file    Non-medical: Not on file  Tobacco Use  . Smoking status: Former Smoker    Packs/day: 0.50    Years: 13.00    Pack years: 6.50    Types: Cigarettes  . Smokeless tobacco: Never Used  Substance and Sexual Activity  . Alcohol use: Yes   Comment: Occasional  . Drug use: No  . Sexual activity: Not on file  Lifestyle  . Physical activity:    Days per week: Not on file    Minutes per session: Not on file  . Stress: Not on file  Relationships  . Social connections:    Talks on phone: Not on file    Gets together: Not on file    Attends religious service: Not on file    Active member of club or organization: Not on file    Attends meetings of clubs or organizations: Not on file    Relationship status: Not on file  . Intimate partner violence:    Fear of current or ex partner: Not on file    Emotionally abused: Not on file    Physically abused: Not on file    Forced sexual activity: Not on file  Other Topics Concern  . Not on file  Social History Narrative  . Not on file     Current Outpatient Medications on File Prior to Visit  Medication Sig Dispense Refill  . aspirin EC 81 MG tablet Take 81 mg by mouth daily.    . cinacalcet (SENSIPAR) 30 MG tablet Take 1 tablet by mouth daily.    . ferrous  sulfate 324 (65 FE) MG TBEC Take 1 tablet (325 mg total) by mouth 2 (two) times daily. 30 tablet 0  . HYDROcodone-acetaminophen (NORCO) 10-325 MG tablet Take 1 tablet by mouth every 8 (eight) hours as needed. 90 tablet 0  . pantoprazole (PROTONIX) 40 MG tablet Take 1 tablet (40 mg total) by mouth 2 (two) times daily. 60 tablet 0  . predniSONE (DELTASONE) 5 MG tablet Take 5 mg by mouth daily.    . promethazine (PHENERGAN) 25 MG tablet Take 25 mg by mouth every 6 (six) hours as needed for nausea or vomiting.    . tamsulosin (FLOMAX) 0.4 MG CAPS capsule Take 0.8 mg by mouth as needed.     . diclofenac sodium (VOLTAREN) 1 % GEL Apply 1 application topically 3 (three) times daily. To hands, knees, feet (Patient not taking: Reported on 03/25/2018) 3 Tube 4   No current facility-administered medications on file prior to visit.     Cardiovascular studies:  EKG 03/0/2020: Sinus rhythm 69 bpm Left ventricular hypertrophy RBBB,  LAFB Second degree type 1 AV block  Recent labs:  07/02/2017:  Glucose 115, Creatinine 5.12, Potassium 4.3, Chloride 99, BMP otherwise normal.  CBC: Hemoglobin 7.9, Hematocrit 23.9, Platelets 103.  Per patient, las Hb was 11.   Review of Systems  Constitution: Negative for decreased appetite, malaise/fatigue, weight gain and weight loss.  HENT: Negative for congestion.   Eyes: Negative for visual disturbance.  Cardiovascular: Negative for chest pain, claudication, dyspnea on exertion, leg swelling, palpitations and syncope.  Respiratory: Negative for shortness of breath.   Endocrine: Negative for cold intolerance.  Hematologic/Lymphatic: Does not bruise/bleed easily.  Skin: Negative for itching and rash.  Musculoskeletal: Negative for myalgias.  Gastrointestinal: Negative for abdominal pain, nausea and vomiting.  Genitourinary: Negative for dysuria.  Neurological: Negative for dizziness and weakness.  Psychiatric/Behavioral: The patient is not nervous/anxious.   All other systems reviewed and are negative.        Vitals:   03/25/18 0922  BP: (!) 112/57  Pulse: 85  SpO2: 91%    Objective:   Physical Exam  Constitutional: He is oriented to person, place, and time. He appears well-developed and well-nourished. No distress.  HENT:  Head: Normocephalic and atraumatic.  Eyes: Pupils are equal, round, and reactive to light. Conjunctivae are normal.  Neck: No JVD present.  Cardiovascular: Normal rate and regular rhythm.  Murmur (II/VI holosystolic ) heard. Pulses:      Dorsalis pedis pulses are 1+ on the right side and 1+ on the left side.       Posterior tibial pulses are 0 on the right side and 0 on the left side.  LUE AV fistula  Pulmonary/Chest: Effort normal and breath sounds normal. He has no wheezes. He has no rales.  Abdominal: Soft. Bowel sounds are normal. There is no rebound.  Musculoskeletal:        General: No edema.  Lymphadenopathy:    He has no cervical  adenopathy.  Neurological: He is alert and oriented to person, place, and time. No cranial nerve deficit.  Skin: Skin is warm and dry.  Psychiatric: He has a normal mood and affect.  Nursing note and vitals reviewed.         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 There  are no diagnoses linked to this encounter.  Pre-op workup: Patient is currently asymptomatic.  He does have a murmur which likely is due to presence of AV fistula and high output.  However, calcific stenosis cannot be excluded.  Recommend echocardiogram and exercise/Lexiscan nuclear stress test given his candidacy for solid organ transplant.   There is mention of pulmonary hypertension in [place] note, but patient tells me that he had this in the past and currently does not have it.  Nonetheless, echocardiogram will help [place] evaluate his pulmonary pressure.  No changes made to his medications today.  I will see him back in 1 year after repeat echocardiogram and stress test.  Second degree type 1 AV block: Incidental, benign finding  Thank you for referring the patient to [place]. Please feel free to contact with any questions.  Nigel Mormon, MD [facility] [place] Pager: [phone] Office: [phone] If no answer Cell [phone]

## 2018-03-25 ENCOUNTER — Ambulatory Visit (INDEPENDENT_AMBULATORY_CARE_PROVIDER_SITE_OTHER): Payer: No Typology Code available for payment source | Admitting: Cardiology

## 2018-03-25 ENCOUNTER — Encounter: Payer: Self-pay | Admitting: Cardiology

## 2018-03-25 VITALS — BP 112/57 | HR 85 | Ht 74.0 in | Wt 207.0 lb

## 2018-03-25 DIAGNOSIS — I441 Atrioventricular block, second degree: Secondary | ICD-10-CM

## 2018-03-25 DIAGNOSIS — Z0189 Encounter for other specified special examinations: Secondary | ICD-10-CM | POA: Insufficient documentation

## 2018-03-25 DIAGNOSIS — Z01818 Encounter for other preprocedural examination: Secondary | ICD-10-CM

## 2018-03-25 DIAGNOSIS — R011 Cardiac murmur, unspecified: Secondary | ICD-10-CM

## 2018-03-30 ENCOUNTER — Encounter: Payer: Self-pay | Admitting: Registered Nurse

## 2018-03-30 ENCOUNTER — Encounter
Payer: No Typology Code available for payment source | Attending: Physical Medicine & Rehabilitation | Admitting: Registered Nurse

## 2018-03-30 VITALS — BP 102/56 | HR 62 | Ht 74.0 in | Wt 203.0 lb

## 2018-03-30 DIAGNOSIS — M7062 Trochanteric bursitis, left hip: Secondary | ICD-10-CM | POA: Diagnosis not present

## 2018-03-30 DIAGNOSIS — Z79891 Long term (current) use of opiate analgesic: Secondary | ICD-10-CM | POA: Diagnosis not present

## 2018-03-30 DIAGNOSIS — M25561 Pain in right knee: Secondary | ICD-10-CM | POA: Diagnosis not present

## 2018-03-30 DIAGNOSIS — Z5181 Encounter for therapeutic drug level monitoring: Secondary | ICD-10-CM

## 2018-03-30 DIAGNOSIS — I120 Hypertensive chronic kidney disease with stage 5 chronic kidney disease or end stage renal disease: Secondary | ICD-10-CM | POA: Insufficient documentation

## 2018-03-30 DIAGNOSIS — Z7952 Long term (current) use of systemic steroids: Secondary | ICD-10-CM | POA: Diagnosis not present

## 2018-03-30 DIAGNOSIS — Z87891 Personal history of nicotine dependence: Secondary | ICD-10-CM | POA: Diagnosis not present

## 2018-03-30 DIAGNOSIS — M545 Low back pain: Secondary | ICD-10-CM | POA: Diagnosis not present

## 2018-03-30 DIAGNOSIS — M25552 Pain in left hip: Secondary | ICD-10-CM | POA: Diagnosis not present

## 2018-03-30 DIAGNOSIS — M7581 Other shoulder lesions, right shoulder: Secondary | ICD-10-CM | POA: Insufficient documentation

## 2018-03-30 DIAGNOSIS — M7522 Bicipital tendinitis, left shoulder: Secondary | ICD-10-CM | POA: Diagnosis not present

## 2018-03-30 DIAGNOSIS — Z79899 Other long term (current) drug therapy: Secondary | ICD-10-CM | POA: Insufficient documentation

## 2018-03-30 DIAGNOSIS — N186 End stage renal disease: Secondary | ICD-10-CM | POA: Insufficient documentation

## 2018-03-30 DIAGNOSIS — M79673 Pain in unspecified foot: Secondary | ICD-10-CM | POA: Diagnosis not present

## 2018-03-30 DIAGNOSIS — M170 Bilateral primary osteoarthritis of knee: Secondary | ICD-10-CM | POA: Insufficient documentation

## 2018-03-30 DIAGNOSIS — M7582 Other shoulder lesions, left shoulder: Secondary | ICD-10-CM | POA: Insufficient documentation

## 2018-03-30 DIAGNOSIS — L930 Discoid lupus erythematosus: Secondary | ICD-10-CM

## 2018-03-30 DIAGNOSIS — Z992 Dependence on renal dialysis: Secondary | ICD-10-CM | POA: Diagnosis not present

## 2018-03-30 DIAGNOSIS — M25562 Pain in left knee: Secondary | ICD-10-CM | POA: Diagnosis not present

## 2018-03-30 DIAGNOSIS — M25551 Pain in right hip: Secondary | ICD-10-CM | POA: Insufficient documentation

## 2018-03-30 DIAGNOSIS — G894 Chronic pain syndrome: Secondary | ICD-10-CM | POA: Diagnosis not present

## 2018-03-30 DIAGNOSIS — Z8249 Family history of ischemic heart disease and other diseases of the circulatory system: Secondary | ICD-10-CM | POA: Insufficient documentation

## 2018-03-30 DIAGNOSIS — Z940 Kidney transplant status: Secondary | ICD-10-CM | POA: Diagnosis not present

## 2018-03-30 DIAGNOSIS — M25512 Pain in left shoulder: Secondary | ICD-10-CM | POA: Insufficient documentation

## 2018-03-30 DIAGNOSIS — M7061 Trochanteric bursitis, right hip: Secondary | ICD-10-CM | POA: Diagnosis not present

## 2018-03-30 MED ORDER — HYDROCODONE-ACETAMINOPHEN 10-325 MG PO TABS: 1.0000 | ORAL_TABLET | Freq: Three times a day (TID) | ORAL | 0 refills | Status: DC | PRN

## 2018-03-30 NOTE — Progress Notes (Signed)
Subjective:    Patient ID: [name], male    DOB: Aug 21, 1970, 48 y.o.   MRN: [id number]  HPI: [name] is a 48 y.o. male who returns for follow up appointment for chronic pain and medication refill. He states his pain is located in his bilateral hips and bilateral knees. He rates his pain 7. His current exercise regime is walking.  [name] Morphine equivalent is 30.00 MME.  Last Oral Swab was Performed on 02/08/2018  Pain Inventory Average Pain 5 Pain Right Now 7 My pain is sharp  In the last 24 hours, has pain interfered with the following? General activity 2 Relation with others 0 Enjoyment of life 5 What TIME of day is your pain at its worst? morning Sleep (in general) Poor  Pain is worse with: walking, bending and standing Pain improves with: rest, heat/ice and medication Relief from Meds: 7  Mobility walk without assistance ability to climb steps?  yes do you drive?  yes  Function disabled: date disabled 2002  Neuro/Psych No problems in this area  Prior Studies Any changes since last visit?  no  Physicians involved in your care Any changes since last visit?  no   Family History  Problem Relation Age of Onset  . Hypertension Other   . Brain cancer Mother        Died at age 79  . Aneurysm Mother   . CAD Father   . Heart attack Father 89   Social History   Socioeconomic History  . Marital status: Divorced    Spouse name: Not on file  . Number of children: 2  . Years of education: Not on file  . Highest education level: Not on file  Occupational History  . Not on file  Social Needs  . Financial resource strain: Not on file  . Food insecurity:    Worry: Not on file    Inability: Not on file  . Transportation needs:    Medical: Not on file    Non-medical: Not on file  Tobacco Use  . Smoking status: Former Smoker    Packs/day: 0.50    Years: 13.00    Pack years: 6.50    Types: Cigarettes  . Smokeless tobacco: Never Used    Substance and Sexual Activity  . Alcohol use: Yes    Comment: Occasional  . Drug use: No  . Sexual activity: Not on file  Lifestyle  . Physical activity:    Days per week: Not on file    Minutes per session: Not on file  . Stress: Not on file  Relationships  . Social connections:    Talks on phone: Not on file    Gets together: Not on file    Attends religious service: Not on file    Active member of club or organization: Not on file    Attends meetings of clubs or organizations: Not on file    Relationship status: Not on file  Other Topics Concern  . Not on file  Social History Narrative  . Not on file   Past Surgical History:  Procedure Laterality Date  . NEPHRECTOMY TRANSPLANTED ORGAN     Past Medical History:  Diagnosis Date  . Anemia   . Collagen vascular disease ([place])   . Lupus ([place])   . Renal disorder   . Renal insufficiency   . Renal transplant recipient    BP (!) 102/56   Pulse 62   Ht 6\' 2"  (  1.88 m)   Wt 203 lb (92.1 kg)   SpO2 98%   BMI 26.06 kg/m   Opioid Risk Score:   Fall Risk Score:  `1  Depression screen PHQ 2/9  No flowsheet data found.   Review of Systems  Constitutional: Negative.   HENT: Negative.   Eyes: Negative.   Respiratory: Negative.   Cardiovascular: Negative.   Gastrointestinal: Negative.   Endocrine: Negative.   Genitourinary: Negative.   Musculoskeletal: Positive for arthralgias.  Skin: Negative.   Allergic/Immunologic: Negative.   Neurological: Negative.   Hematological: Negative.   Psychiatric/Behavioral: Negative.   All other systems reviewed and are negative.      Objective:   Physical Exam Vitals signs and nursing note reviewed.  Constitutional:      Appearance: Normal appearance.  Neck:     Musculoskeletal: Normal range of motion and neck supple.  Cardiovascular:     Rate and Rhythm: Normal rate and regular rhythm.     Pulses: Normal pulses.     Heart sounds: Normal heart sounds.  Pulmonary:      Effort: Pulmonary effort is normal.     Breath sounds: Normal breath sounds.  Musculoskeletal:     Comments: Normal Muscle Bulk and Muscle Testing Reveals:  Upper Extremities: Full ROM and Muscle Strength 5/5  Lower Extremities: Full ROM and Muscle Strength 5/5 Arises from Table slowly Narrow Based  Gait   Skin:    General: Skin is warm and dry.  Neurological:     Mental Status: He is alert and oriented to person, place, and time.  Psychiatric:        Mood and Affect: Mood normal.        Behavior: Behavior normal.           Assessment & Plan:  1. Systemic Lupus Multiple Joint Involvement/ ESRD: Continue HEP as Tolerated . Left AVF + bruit and thrill.Marland Kitchen 03/30/2018 Rheumatology and Nephrology Following.  2. Tendonitis of both rotator cuffs: No complaints today.Continue HEP as Tolerated. Continue to Monitor.03/30/2018 3. Chronic Pain Syndrome: Refilled: Hydrocodone 10/325 mg one table every 8 hours as needed for pain. #90. Continue Voltaren Gel. 03/30/2018 We will continue the opioid monitoring program, this consists of regular clinic visits, examinations, urine drug screen, pill counts as well as use of [place] Controlled Substance Reporting system. 4. Polyarthralgia: Continue to Monitor. 03/30/2018 5. Greater Trochanter Bursitis: Continue to Alternate Ice and Heat Therapy. Continue to Monitor. 03/30/2018 6. Chronic Bilateral Knee Pain: Continue HEP as Tolerated and Continue to Monitor. 03/30/2018 7. Chronic Bilateral Thoracic Pain: No complaints today. Continue current medication regimen. Continue to monitor. 03/30/2018  33minutes of face to face patient care time was spent during this visit. All questions were encouraged and answered.  F/U in 1 month

## 2018-04-05 ENCOUNTER — Encounter: Payer: Self-pay | Admitting: Cardiology

## 2018-04-13 ENCOUNTER — Other Ambulatory Visit: Payer: No Typology Code available for payment source

## 2018-04-27 ENCOUNTER — Encounter: Payer: Self-pay | Admitting: Registered Nurse

## 2018-04-27 ENCOUNTER — Other Ambulatory Visit: Payer: Self-pay

## 2018-04-27 ENCOUNTER — Encounter
Payer: No Typology Code available for payment source | Attending: Physical Medicine & Rehabilitation | Admitting: Registered Nurse

## 2018-04-27 VITALS — BP 143/70

## 2018-04-27 DIAGNOSIS — M79673 Pain in unspecified foot: Secondary | ICD-10-CM | POA: Insufficient documentation

## 2018-04-27 DIAGNOSIS — I120 Hypertensive chronic kidney disease with stage 5 chronic kidney disease or end stage renal disease: Secondary | ICD-10-CM | POA: Insufficient documentation

## 2018-04-27 DIAGNOSIS — M545 Low back pain: Secondary | ICD-10-CM | POA: Insufficient documentation

## 2018-04-27 DIAGNOSIS — M7061 Trochanteric bursitis, right hip: Secondary | ICD-10-CM | POA: Diagnosis not present

## 2018-04-27 DIAGNOSIS — M7581 Other shoulder lesions, right shoulder: Secondary | ICD-10-CM | POA: Diagnosis not present

## 2018-04-27 DIAGNOSIS — M25561 Pain in right knee: Secondary | ICD-10-CM | POA: Insufficient documentation

## 2018-04-27 DIAGNOSIS — M25512 Pain in left shoulder: Secondary | ICD-10-CM | POA: Insufficient documentation

## 2018-04-27 DIAGNOSIS — Z5181 Encounter for therapeutic drug level monitoring: Secondary | ICD-10-CM

## 2018-04-27 DIAGNOSIS — M7062 Trochanteric bursitis, left hip: Secondary | ICD-10-CM | POA: Insufficient documentation

## 2018-04-27 DIAGNOSIS — Z87891 Personal history of nicotine dependence: Secondary | ICD-10-CM | POA: Insufficient documentation

## 2018-04-27 DIAGNOSIS — Z992 Dependence on renal dialysis: Secondary | ICD-10-CM | POA: Insufficient documentation

## 2018-04-27 DIAGNOSIS — L930 Discoid lupus erythematosus: Secondary | ICD-10-CM | POA: Diagnosis not present

## 2018-04-27 DIAGNOSIS — Z79891 Long term (current) use of opiate analgesic: Secondary | ICD-10-CM

## 2018-04-27 DIAGNOSIS — M25562 Pain in left knee: Secondary | ICD-10-CM | POA: Insufficient documentation

## 2018-04-27 DIAGNOSIS — Z79899 Other long term (current) drug therapy: Secondary | ICD-10-CM | POA: Insufficient documentation

## 2018-04-27 DIAGNOSIS — N186 End stage renal disease: Secondary | ICD-10-CM | POA: Insufficient documentation

## 2018-04-27 DIAGNOSIS — M7522 Bicipital tendinitis, left shoulder: Secondary | ICD-10-CM | POA: Insufficient documentation

## 2018-04-27 DIAGNOSIS — M2550 Pain in unspecified joint: Secondary | ICD-10-CM

## 2018-04-27 DIAGNOSIS — M170 Bilateral primary osteoarthritis of knee: Secondary | ICD-10-CM | POA: Insufficient documentation

## 2018-04-27 DIAGNOSIS — M25552 Pain in left hip: Secondary | ICD-10-CM | POA: Insufficient documentation

## 2018-04-27 DIAGNOSIS — M25551 Pain in right hip: Secondary | ICD-10-CM | POA: Insufficient documentation

## 2018-04-27 DIAGNOSIS — Z940 Kidney transplant status: Secondary | ICD-10-CM | POA: Insufficient documentation

## 2018-04-27 DIAGNOSIS — G894 Chronic pain syndrome: Secondary | ICD-10-CM

## 2018-04-27 DIAGNOSIS — M7582 Other shoulder lesions, left shoulder: Secondary | ICD-10-CM | POA: Insufficient documentation

## 2018-04-27 DIAGNOSIS — Z7952 Long term (current) use of systemic steroids: Secondary | ICD-10-CM | POA: Insufficient documentation

## 2018-04-27 DIAGNOSIS — G8929 Other chronic pain: Secondary | ICD-10-CM

## 2018-04-27 DIAGNOSIS — Z8249 Family history of ischemic heart disease and other diseases of the circulatory system: Secondary | ICD-10-CM | POA: Insufficient documentation

## 2018-04-27 MED ORDER — HYDROCODONE-ACETAMINOPHEN 10-325 MG PO TABS: 1.0000 | ORAL_TABLET | Freq: Three times a day (TID) | ORAL | 0 refills | Status: DC | PRN

## 2018-04-27 NOTE — Progress Notes (Signed)
Subjective:    Patient ID: [name], male    DOB: 19-Jun-1970, 48 y.o.   MRN: [id number]  HPI: [name] is a 48 y.o. male his appointment was changed, due to national recommendations of social distancing due to [place] 19, an audio/video telehealth visit is felt to be most appropriate for this patient at this time.  See Chart message from today for the patient's consent to telehealth from [name].     He states his pain is located in his bilateral shoulders and bilateral hips. Also reports generalized joint pain.  He rates his pain 7. His current exercise regime is walking, light yard work and performing stretching exercises.  [name] Morphine equivalent is 30.00 MME.  Last Oral Swab was Performed on 02/02/2018, it was consistent.   Walter Dapper RN asked The Health and History Question. This Provider and [name] verified we were speaking with the correct person using two identifiers.   Pain Inventory Average Pain 7 Pain Right Now 7 My pain is constant, sharp and aching  In the last 24 hours, has pain interfered with the following? General activity 0 Relation with others 0 Enjoyment of life 0 What TIME of day is your pain at its worst? morning Sleep (in general) Poor  Pain is worse with: walking and bending Pain improves with: rest and medication Relief from Meds: 5  Mobility walk without assistance how many minutes can you walk? 30 ability to climb steps?  yes do you drive?  yes  Function disabled: date disabled .  Neuro/Psych No problems in this area  Prior Studies Any changes since last visit?  no  Physicians involved in your care Any changes since last visit?  no   Family History  Problem Relation Age of Onset  . Hypertension Other   . Brain cancer Mother        Died at age 31  . Aneurysm Mother   . CAD Father   . Heart attack Father 82   Social History   Socioeconomic History  . Marital  status: Divorced    Spouse name: Not on file  . Number of children: 2  . Years of education: Not on file  . Highest education level: Not on file  Occupational History  . Not on file  Social Needs  . Financial resource strain: Not on file  . Food insecurity:    Worry: Not on file    Inability: Not on file  . Transportation needs:    Medical: Not on file    Non-medical: Not on file  Tobacco Use  . Smoking status: Former Smoker    Packs/day: 0.50    Years: 13.00    Pack years: 6.50    Types: Cigarettes  . Smokeless tobacco: Never Used  Substance and Sexual Activity  . Alcohol use: Yes    Comment: Occasional  . Drug use: No  . Sexual activity: Not on file  Lifestyle  . Physical activity:    Days per week: Not on file    Minutes per session: Not on file  . Stress: Not on file  Relationships  . Social connections:    Talks on phone: Not on file    Gets together: Not on file    Attends religious service: Not on file    Active member of club or organization: Not on file    Attends meetings of clubs or organizations: Not on file  Relationship status: Not on file  Other Topics Concern  . Not on file  Social History Narrative  . Not on file   Past Surgical History:  Procedure Laterality Date  . NEPHRECTOMY TRANSPLANTED ORGAN     Past Medical History:  Diagnosis Date  . Anemia   . Collagen vascular disease ([place])   . Lupus ([place])   . Renal disorder   . Renal insufficiency   . Renal transplant recipient    BP (!) 143/70 Comment: self report from home monitor  Opioid Risk Score:   Fall Risk Score:  `1  Depression screen PHQ 2/9  Depression screen PHQ 2/9 04/27/2018  Decreased Interest 0  Down, Depressed, Hopeless 0  PHQ - 2 Score 0    Review of Systems  Constitutional: Negative.   HENT: Negative.   Eyes: Negative.   Respiratory: Negative.   Cardiovascular: Negative.   Gastrointestinal: Negative.   Genitourinary:       Dialysis  Musculoskeletal:  Negative.   Skin: Negative.   Allergic/Immunologic: Negative.   Neurological: Negative.   Hematological: Negative.   Psychiatric/Behavioral: Negative.   All other systems reviewed and are negative.      Objective:   Physical Exam Vitals signs and nursing note reviewed.  Neurological:     Mental Status: He is oriented to person, place, and time.           Assessment & Plan:  1. Systemic Lupus Multiple Joint Involvement/ ESRD: Continue HEP as Tolerated. Left AVF + bruit and thrill.Marland Kitchen 04/27/2018 Rheumatology and Nephrology Following.  2. Tendonitis of both rotator cuffs:.Continue HEP as Tolerated. Continue to Monitor.04/27/2018 3. Chronic Pain Syndrome: Refilled: Hydrocodone 10/325 mg one table every 8 hours as needed for pain. #90. Continue Voltaren Gel. 04/27/2018 We will continue the opioid monitoring program, this consists of regular clinic visits, examinations, urine drug screen, pill counts as well as use of [place] Controlled Substance Reporting system. 4. Polyarthralgia: Continue to Monitor. 04/27/2018 5. Greater Trochanter Bursitis: Continue to Alternate Ice and Heat Therapy. Continue to Monitor. 04/27/2018 6. Chronic Bilateral Knee Pain: Continue HEP as Tolerated and Continue to Monitor. 04/27/2018 7. Chronic Bilateral Thoracic Pain: No complaints today. Continue current medication regimen. Continue to monitor.04/27/2018   F/U in 1 month Location of patient: In his Home Location of provider: Office Time spent on call: 10 Minutes

## 2018-05-10 ENCOUNTER — Other Ambulatory Visit: Payer: No Typology Code available for payment source

## 2018-05-25 ENCOUNTER — Encounter
Payer: No Typology Code available for payment source | Attending: Physical Medicine & Rehabilitation | Admitting: Registered Nurse

## 2018-05-25 ENCOUNTER — Encounter: Payer: Self-pay | Admitting: Registered Nurse

## 2018-05-25 ENCOUNTER — Other Ambulatory Visit: Payer: Self-pay

## 2018-05-25 VITALS — Ht 74.0 in | Wt 205.0 lb

## 2018-05-25 DIAGNOSIS — M7582 Other shoulder lesions, left shoulder: Secondary | ICD-10-CM | POA: Insufficient documentation

## 2018-05-25 DIAGNOSIS — M79673 Pain in unspecified foot: Secondary | ICD-10-CM | POA: Insufficient documentation

## 2018-05-25 DIAGNOSIS — M7522 Bicipital tendinitis, left shoulder: Secondary | ICD-10-CM | POA: Insufficient documentation

## 2018-05-25 DIAGNOSIS — M25512 Pain in left shoulder: Secondary | ICD-10-CM | POA: Insufficient documentation

## 2018-05-25 DIAGNOSIS — M545 Low back pain: Secondary | ICD-10-CM | POA: Insufficient documentation

## 2018-05-25 DIAGNOSIS — M170 Bilateral primary osteoarthritis of knee: Secondary | ICD-10-CM | POA: Insufficient documentation

## 2018-05-25 DIAGNOSIS — Z79891 Long term (current) use of opiate analgesic: Secondary | ICD-10-CM

## 2018-05-25 DIAGNOSIS — G894 Chronic pain syndrome: Secondary | ICD-10-CM | POA: Diagnosis not present

## 2018-05-25 DIAGNOSIS — Z940 Kidney transplant status: Secondary | ICD-10-CM | POA: Insufficient documentation

## 2018-05-25 DIAGNOSIS — Z7952 Long term (current) use of systemic steroids: Secondary | ICD-10-CM | POA: Insufficient documentation

## 2018-05-25 DIAGNOSIS — G8929 Other chronic pain: Secondary | ICD-10-CM

## 2018-05-25 DIAGNOSIS — Z87891 Personal history of nicotine dependence: Secondary | ICD-10-CM | POA: Insufficient documentation

## 2018-05-25 DIAGNOSIS — I120 Hypertensive chronic kidney disease with stage 5 chronic kidney disease or end stage renal disease: Secondary | ICD-10-CM | POA: Insufficient documentation

## 2018-05-25 DIAGNOSIS — M7581 Other shoulder lesions, right shoulder: Secondary | ICD-10-CM | POA: Insufficient documentation

## 2018-05-25 DIAGNOSIS — L930 Discoid lupus erythematosus: Secondary | ICD-10-CM

## 2018-05-25 DIAGNOSIS — Z79899 Other long term (current) drug therapy: Secondary | ICD-10-CM | POA: Insufficient documentation

## 2018-05-25 DIAGNOSIS — Z8249 Family history of ischemic heart disease and other diseases of the circulatory system: Secondary | ICD-10-CM | POA: Insufficient documentation

## 2018-05-25 DIAGNOSIS — M7062 Trochanteric bursitis, left hip: Secondary | ICD-10-CM

## 2018-05-25 DIAGNOSIS — M2550 Pain in unspecified joint: Secondary | ICD-10-CM | POA: Diagnosis not present

## 2018-05-25 DIAGNOSIS — M7061 Trochanteric bursitis, right hip: Secondary | ICD-10-CM

## 2018-05-25 DIAGNOSIS — Z992 Dependence on renal dialysis: Secondary | ICD-10-CM | POA: Insufficient documentation

## 2018-05-25 DIAGNOSIS — N186 End stage renal disease: Secondary | ICD-10-CM | POA: Insufficient documentation

## 2018-05-25 DIAGNOSIS — M25562 Pain in left knee: Secondary | ICD-10-CM

## 2018-05-25 DIAGNOSIS — M25561 Pain in right knee: Secondary | ICD-10-CM

## 2018-05-25 DIAGNOSIS — Z5181 Encounter for therapeutic drug level monitoring: Secondary | ICD-10-CM

## 2018-05-25 DIAGNOSIS — M25552 Pain in left hip: Secondary | ICD-10-CM | POA: Insufficient documentation

## 2018-05-25 DIAGNOSIS — M25551 Pain in right hip: Secondary | ICD-10-CM | POA: Insufficient documentation

## 2018-05-25 MED ORDER — HYDROCODONE-ACETAMINOPHEN 10-325 MG PO TABS: 1.0000 | ORAL_TABLET | Freq: Three times a day (TID) | ORAL | 0 refills | Status: DC | PRN

## 2018-05-25 NOTE — Progress Notes (Signed)
Subjective:    Patient ID: [name], male    DOB: 05/25/1970, 48 y.o.   MRN: [id number]  HPI: [name] is a 48 y.o. male  His appointment was changed, due to national recommendations of social distancing due to [place] 19, an audio/video telehealth visit is felt to be most appropriate for this patient at this time.  See Chart message from today for the patient's consent to telehealth from [place].   He states his pain is located in his bilateral hips and bilateral knees. He rates his pain 6. His current exercise regime is walking and performing stretching exercises.  [name] Morphine equivalent is 30.00 MME.  Last Oral Swab was Performed on 02/02/2018, it was consistent.   [name] CMA asked the Health and History Questions. This provider and [name] verified we were speaking with the correct person using two identifiers.   Pain Inventory Average Pain 6 Pain Right Now 6 My pain is intermittent and aching  In the last 24 hours, has pain interfered with the following? General activity 6 Relation with others 0 Enjoyment of life 3 What TIME of day is your pain at its worst? morning Sleep (in general) Poor  Pain is worse with: walking Pain improves with: rest and medication Relief from Meds: 7  Mobility walk without assistance ability to climb steps?  yes do you drive?  yes  Function disabled: date disabled .  Neuro/Psych No problems in this area  Prior Studies Any changes since last visit?  no  Physicians involved in your care Any changes since last visit?  no   Family History  Problem Relation Age of Onset  . Hypertension Other   . Brain cancer Mother        Died at age 87  . Aneurysm Mother   . CAD Father   . Heart attack Father 34   Social History   Socioeconomic History  . Marital status: Divorced    Spouse name: Not on file  . Number of children: 2  . Years of education: Not on file  .  Highest education level: Not on file  Occupational History  . Not on file  Social Needs  . Financial resource strain: Not on file  . Food insecurity:    Worry: Not on file    Inability: Not on file  . Transportation needs:    Medical: Not on file    Non-medical: Not on file  Tobacco Use  . Smoking status: Former Smoker    Packs/day: 0.50    Years: 13.00    Pack years: 6.50    Types: Cigarettes  . Smokeless tobacco: Never Used  Substance and Sexual Activity  . Alcohol use: Yes    Comment: Occasional  . Drug use: No  . Sexual activity: Not on file  Lifestyle  . Physical activity:    Days per week: Not on file    Minutes per session: Not on file  . Stress: Not on file  Relationships  . Social connections:    Talks on phone: Not on file    Gets together: Not on file    Attends religious service: Not on file    Active member of club or organization: Not on file    Attends meetings of clubs or organizations: Not on file    Relationship status: Not on file  Other Topics Concern  . Not on file  Social History Narrative  .  Not on file   Past Surgical History:  Procedure Laterality Date  . NEPHRECTOMY TRANSPLANTED ORGAN     Past Medical History:  Diagnosis Date  . Anemia   . Collagen vascular disease ([place])   . Lupus ([place])   . Renal disorder   . Renal insufficiency   . Renal transplant recipient    Ht 6\' 2"  (1.88 m)   Wt 205 lb (93 kg)   BMI 26.32 kg/m   Opioid Risk Score:   Fall Risk Score:  `1  Depression screen PHQ 2/9  Depression screen PHQ 2/9 04/27/2018  Decreased Interest 0  Down, Depressed, Hopeless 0  PHQ - 2 Score 0    Review of Systems  Constitutional: Negative.   HENT: Negative.   Eyes: Negative.   Respiratory: Negative.   Gastrointestinal: Negative.   Endocrine: Negative.   Genitourinary: Negative.   Musculoskeletal: Positive for arthralgias and back pain.  Skin: Negative.   Allergic/Immunologic: Negative.   Neurological: Negative.    Hematological: Negative.   Psychiatric/Behavioral: Negative.   All other systems reviewed and are negative.      Objective:   Physical Exam Vitals signs and nursing note reviewed.  Musculoskeletal:     Comments: No Physical Exam Perform: Virtual Visit   Neurological:     Mental Status: He is oriented to person, place, and time.           Assessment & Plan:  1. Systemic Lupus Multiple Joint Involvement/ ESRD: Continue HEP as Tolerated. Left AVF+ bruit and thrill.Marland Kitchen 05/25/2018 Rheumatology and Nephrology Following.  2. Tendonitis of both rotator cuffs:.Continue HEP as Tolerated. Continue to Monitor.05/25/2018 3. Chronic Pain Syndrome: Refilled: Hydrocodone 10/325 mg one table every 8 hours as needed for pain. #90. Continue Voltaren Gel. 05/25/2018 We will continue the opioid monitoring program, this consists of regular clinic visits, examinations, urine drug screen, pill counts as well as use of [place] Controlled Substance Reporting system. 4. Polyarthralgia: Continue to Monitor. 05/25/2018 5. Greater Trochanter Bursitis: Continue to Alternate Ice and Heat Therapy. Continue to Monitor.05/25/2018 6. Chronic Bilateral Knee Pain: Continue HEP as Tolerated and Continue to Monitor.05/25/2018 7. Chronic Bilateral Thoracic Pain:No complaints today.Continue current medication regimen. Continue to monitor.05/25/2018  Telephone Call  Location of patient: In his Home Location of provider: Office Established patient Time spent on call: 10 minutes

## 2018-06-21 ENCOUNTER — Other Ambulatory Visit: Payer: No Typology Code available for payment source

## 2018-06-23 ENCOUNTER — Ambulatory Visit (INDEPENDENT_AMBULATORY_CARE_PROVIDER_SITE_OTHER): Payer: No Typology Code available for payment source

## 2018-06-23 ENCOUNTER — Other Ambulatory Visit: Payer: Self-pay

## 2018-06-23 DIAGNOSIS — Z01818 Encounter for other preprocedural examination: Secondary | ICD-10-CM | POA: Diagnosis not present

## 2018-06-24 ENCOUNTER — Encounter
Payer: No Typology Code available for payment source | Attending: Physical Medicine & Rehabilitation | Admitting: Registered Nurse

## 2018-06-24 VITALS — BP 125/63 | HR 86 | Temp 98.6°F | Ht 74.0 in | Wt 214.4 lb

## 2018-06-24 DIAGNOSIS — M79673 Pain in unspecified foot: Secondary | ICD-10-CM | POA: Diagnosis not present

## 2018-06-24 DIAGNOSIS — M7061 Trochanteric bursitis, right hip: Secondary | ICD-10-CM | POA: Diagnosis not present

## 2018-06-24 DIAGNOSIS — M25512 Pain in left shoulder: Secondary | ICD-10-CM | POA: Diagnosis not present

## 2018-06-24 DIAGNOSIS — Z5181 Encounter for therapeutic drug level monitoring: Secondary | ICD-10-CM

## 2018-06-24 DIAGNOSIS — Z79891 Long term (current) use of opiate analgesic: Secondary | ICD-10-CM | POA: Diagnosis not present

## 2018-06-24 DIAGNOSIS — G894 Chronic pain syndrome: Secondary | ICD-10-CM

## 2018-06-24 DIAGNOSIS — M25551 Pain in right hip: Secondary | ICD-10-CM | POA: Diagnosis not present

## 2018-06-24 DIAGNOSIS — Z992 Dependence on renal dialysis: Secondary | ICD-10-CM | POA: Diagnosis not present

## 2018-06-24 DIAGNOSIS — Z79899 Other long term (current) drug therapy: Secondary | ICD-10-CM | POA: Insufficient documentation

## 2018-06-24 DIAGNOSIS — Z940 Kidney transplant status: Secondary | ICD-10-CM | POA: Diagnosis not present

## 2018-06-24 DIAGNOSIS — M2550 Pain in unspecified joint: Secondary | ICD-10-CM | POA: Diagnosis not present

## 2018-06-24 DIAGNOSIS — M25562 Pain in left knee: Secondary | ICD-10-CM

## 2018-06-24 DIAGNOSIS — M25552 Pain in left hip: Secondary | ICD-10-CM | POA: Insufficient documentation

## 2018-06-24 DIAGNOSIS — N186 End stage renal disease: Secondary | ICD-10-CM | POA: Diagnosis not present

## 2018-06-24 DIAGNOSIS — G8929 Other chronic pain: Secondary | ICD-10-CM

## 2018-06-24 DIAGNOSIS — L930 Discoid lupus erythematosus: Secondary | ICD-10-CM

## 2018-06-24 DIAGNOSIS — Z7952 Long term (current) use of systemic steroids: Secondary | ICD-10-CM | POA: Diagnosis not present

## 2018-06-24 DIAGNOSIS — M545 Low back pain: Secondary | ICD-10-CM | POA: Diagnosis not present

## 2018-06-24 DIAGNOSIS — M7522 Bicipital tendinitis, left shoulder: Secondary | ICD-10-CM | POA: Diagnosis not present

## 2018-06-24 DIAGNOSIS — M7062 Trochanteric bursitis, left hip: Secondary | ICD-10-CM

## 2018-06-24 DIAGNOSIS — M25561 Pain in right knee: Secondary | ICD-10-CM | POA: Diagnosis not present

## 2018-06-24 DIAGNOSIS — Z8249 Family history of ischemic heart disease and other diseases of the circulatory system: Secondary | ICD-10-CM | POA: Insufficient documentation

## 2018-06-24 DIAGNOSIS — Z87891 Personal history of nicotine dependence: Secondary | ICD-10-CM | POA: Diagnosis not present

## 2018-06-24 DIAGNOSIS — M170 Bilateral primary osteoarthritis of knee: Secondary | ICD-10-CM | POA: Insufficient documentation

## 2018-06-24 DIAGNOSIS — M7581 Other shoulder lesions, right shoulder: Secondary | ICD-10-CM | POA: Diagnosis not present

## 2018-06-24 DIAGNOSIS — M7582 Other shoulder lesions, left shoulder: Secondary | ICD-10-CM | POA: Insufficient documentation

## 2018-06-24 DIAGNOSIS — I120 Hypertensive chronic kidney disease with stage 5 chronic kidney disease or end stage renal disease: Secondary | ICD-10-CM | POA: Diagnosis not present

## 2018-06-24 MED ORDER — HYDROCODONE-ACETAMINOPHEN 10-325 MG PO TABS: 1.0000 | ORAL_TABLET | Freq: Three times a day (TID) | ORAL | 0 refills | Status: DC | PRN

## 2018-06-24 NOTE — Progress Notes (Signed)
Please see above

## 2018-06-24 NOTE — Progress Notes (Signed)
Subjective:    Patient ID: [name], male    DOB: 08/29/1970, 48 y.o.   MRN: [id number]  HPI: [name] is a 48 y.o. male who returns for follow up appointment for chronic pain and medication refill. He states his pain is located in his bilateral hips and bilateral knees. Also reports joint pain all over. He rates his pain 7. His current exercise regime is walking and performing light yard work.   Mr. Kalas Morphine equivalent is 30.00 MME.  Last Oral Swab was performed on 02/02/2018, it was consistent.    Pain Inventory Average Pain 7 Pain Right Now 7 My pain is sharp and aching  In the last 24 hours, has pain interfered with the following? General activity 4 Relation with others 0 Enjoyment of life 2 What TIME of day is your pain at its worst? morning Sleep (in general) Good  Pain is worse with: walking and standing Pain improves with: medication Relief from Meds: 6  Mobility walk without assistance how many minutes can you walk? 45 ability to climb steps?  yes do you drive?  yes  Function retired  Neuro/Psych No problems in this area  Prior Studies stress test  Physicians involved in your care Any changes since last visit?  yes   Family History  Problem Relation Age of Onset  . Hypertension Other   . Brain cancer Mother        Died at age 69  . Aneurysm Mother   . CAD Father   . Heart attack Father 33   Social History   Socioeconomic History  . Marital status: Divorced    Spouse name: Not on file  . Number of children: 2  . Years of education: Not on file  . Highest education level: Not on file  Occupational History  . Not on file  Social Needs  . Financial resource strain: Not on file  . Food insecurity:    Worry: Not on file    Inability: Not on file  . Transportation needs:    Medical: Not on file    Non-medical: Not on file  Tobacco Use  . Smoking status: Former Smoker    Packs/day: 0.50    Years: 13.00    Pack years:  6.50    Types: Cigarettes  . Smokeless tobacco: Never Used  Substance and Sexual Activity  . Alcohol use: Yes    Comment: Occasional  . Drug use: No  . Sexual activity: Not on file  Lifestyle  . Physical activity:    Days per week: Not on file    Minutes per session: Not on file  . Stress: Not on file  Relationships  . Social connections:    Talks on phone: Not on file    Gets together: Not on file    Attends religious service: Not on file    Active member of club or organization: Not on file    Attends meetings of clubs or organizations: Not on file    Relationship status: Not on file  Other Topics Concern  . Not on file  Social History Narrative  . Not on file   Past Surgical History:  Procedure Laterality Date  . NEPHRECTOMY TRANSPLANTED ORGAN     Past Medical History:  Diagnosis Date  . Anemia   . Collagen vascular disease ([place])   . Lupus ([place])   . Renal disorder   . Renal insufficiency   . Renal transplant recipient  BP 125/63   Pulse 86   Temp 98.6 F (37 C)   Ht 6\' 2"  (1.88 m)   Wt 214 lb 6.4 oz (97.3 kg)   SpO2 95%   BMI 27.53 kg/m   Opioid Risk Score:   Fall Risk Score:  `1  Depression screen PHQ 2/9  Depression screen PHQ 2/9 04/27/2018  Decreased Interest 0  Down, Depressed, Hopeless 0  PHQ - 2 Score 0     Review of Systems  Constitutional: Negative.   HENT: Negative.   Eyes: Negative.   Respiratory: Negative.   Cardiovascular: Negative.   Gastrointestinal: Negative.   Endocrine: Negative.   Genitourinary: Negative.   Musculoskeletal: Negative.   Skin: Negative.   Allergic/Immunologic: Negative.   Neurological: Negative.   Hematological: Negative.   Psychiatric/Behavioral: Negative.   All other systems reviewed and are negative.      Objective:   Physical Exam Vitals signs and nursing note reviewed.  Constitutional:      Appearance: Normal appearance.  Neck:     Musculoskeletal: Normal range of motion and neck supple.   Cardiovascular:     Rate and Rhythm: Normal rate and regular rhythm.     Pulses: Normal pulses.     Heart sounds: Normal heart sounds.  Pulmonary:     Effort: Pulmonary effort is normal.     Breath sounds: Normal breath sounds.  Musculoskeletal:     Comments: Normal Muscle Bulk and Muscle Testing Reveals:  Upper Extremities: Full ROM and Muscle Strength 5/5  Lower Extremities: Full ROM and Muscle Strength 5/5 Arises from Chair with ease Narrow Based Gait   Skin:    General: Skin is warm and dry.  Neurological:     Mental Status: He is alert and oriented to person, place, and time.  Psychiatric:        Mood and Affect: Mood normal.        Behavior: Behavior normal.           Assessment & Plan:  1. Systemic Lupus Multiple Joint Involvement/ ESRD: Continue HEP as Tolerated. Left AVF+ bruit and thrill.Marland Kitchen 06/24/2018 Rheumatology and Nephrology Following.  2. Tendonitis of both rotator cuffs:No complaints today. Continue HEP as Tolerated. Continue to Monitor.06/24/2018 3. Chronic Pain Syndrome: Refilled: Hydrocodone 10/325 mg one table every 8 hours as needed for pain. #90. Second script e-scribe to accommodate scheduled appointment. Continue Voltaren Gel. 06/24/2018 We will continue the opioid monitoring program, this consists of regular clinic visits, examinations, urine drug screen, pill counts as well as use of [place] Controlled Substance Reporting system. 4. Polyarthralgia: Continue to Monitor. 06/24/2018 5. Greater Trochanter Bursitis: Continue to Alternate Ice and Heat Therapy. Continue to Monitor.06/24/2018 6. Chronic Bilateral Knee Pain: Continue HEP as Tolerated and Continue to Monitor.06/24/2018 7. Chronic Bilateral Thoracic Pain:No complaints today.Continue current medication regimen. Continue to monitor.06/24/2018  15 minutes of face to face patient care time was spent during this visit. All questions were encouraged and answered.  F/U in 1 month

## 2018-06-27 ENCOUNTER — Encounter: Payer: Self-pay | Admitting: Registered Nurse

## 2018-08-03 ENCOUNTER — Encounter
Payer: No Typology Code available for payment source | Attending: Physical Medicine & Rehabilitation | Admitting: Registered Nurse

## 2018-08-03 ENCOUNTER — Other Ambulatory Visit: Payer: Self-pay

## 2018-08-03 ENCOUNTER — Encounter: Payer: Self-pay | Admitting: Registered Nurse

## 2018-08-03 VITALS — BP 126/63 | HR 90 | Temp 97.7°F | Ht 74.0 in | Wt 210.0 lb

## 2018-08-03 DIAGNOSIS — M170 Bilateral primary osteoarthritis of knee: Secondary | ICD-10-CM | POA: Insufficient documentation

## 2018-08-03 DIAGNOSIS — M545 Low back pain: Secondary | ICD-10-CM | POA: Diagnosis not present

## 2018-08-03 DIAGNOSIS — M2550 Pain in unspecified joint: Secondary | ICD-10-CM | POA: Diagnosis not present

## 2018-08-03 DIAGNOSIS — Z940 Kidney transplant status: Secondary | ICD-10-CM | POA: Insufficient documentation

## 2018-08-03 DIAGNOSIS — Z5181 Encounter for therapeutic drug level monitoring: Secondary | ICD-10-CM | POA: Diagnosis not present

## 2018-08-03 DIAGNOSIS — Z992 Dependence on renal dialysis: Secondary | ICD-10-CM | POA: Diagnosis not present

## 2018-08-03 DIAGNOSIS — L930 Discoid lupus erythematosus: Secondary | ICD-10-CM | POA: Insufficient documentation

## 2018-08-03 DIAGNOSIS — M25552 Pain in left hip: Secondary | ICD-10-CM | POA: Diagnosis not present

## 2018-08-03 DIAGNOSIS — M7582 Other shoulder lesions, left shoulder: Secondary | ICD-10-CM | POA: Diagnosis not present

## 2018-08-03 DIAGNOSIS — M7062 Trochanteric bursitis, left hip: Secondary | ICD-10-CM | POA: Diagnosis not present

## 2018-08-03 DIAGNOSIS — N186 End stage renal disease: Secondary | ICD-10-CM | POA: Insufficient documentation

## 2018-08-03 DIAGNOSIS — M7581 Other shoulder lesions, right shoulder: Secondary | ICD-10-CM | POA: Diagnosis not present

## 2018-08-03 DIAGNOSIS — M25562 Pain in left knee: Secondary | ICD-10-CM | POA: Insufficient documentation

## 2018-08-03 DIAGNOSIS — Z79899 Other long term (current) drug therapy: Secondary | ICD-10-CM | POA: Insufficient documentation

## 2018-08-03 DIAGNOSIS — M7061 Trochanteric bursitis, right hip: Secondary | ICD-10-CM | POA: Diagnosis not present

## 2018-08-03 DIAGNOSIS — M7522 Bicipital tendinitis, left shoulder: Secondary | ICD-10-CM | POA: Insufficient documentation

## 2018-08-03 DIAGNOSIS — Z87891 Personal history of nicotine dependence: Secondary | ICD-10-CM | POA: Diagnosis not present

## 2018-08-03 DIAGNOSIS — Z79891 Long term (current) use of opiate analgesic: Secondary | ICD-10-CM | POA: Diagnosis not present

## 2018-08-03 DIAGNOSIS — G894 Chronic pain syndrome: Secondary | ICD-10-CM | POA: Diagnosis not present

## 2018-08-03 DIAGNOSIS — M79673 Pain in unspecified foot: Secondary | ICD-10-CM | POA: Diagnosis not present

## 2018-08-03 DIAGNOSIS — M25561 Pain in right knee: Secondary | ICD-10-CM

## 2018-08-03 DIAGNOSIS — M25551 Pain in right hip: Secondary | ICD-10-CM | POA: Insufficient documentation

## 2018-08-03 DIAGNOSIS — I120 Hypertensive chronic kidney disease with stage 5 chronic kidney disease or end stage renal disease: Secondary | ICD-10-CM | POA: Diagnosis not present

## 2018-08-03 DIAGNOSIS — Z7952 Long term (current) use of systemic steroids: Secondary | ICD-10-CM | POA: Diagnosis not present

## 2018-08-03 DIAGNOSIS — M25512 Pain in left shoulder: Secondary | ICD-10-CM | POA: Insufficient documentation

## 2018-08-03 DIAGNOSIS — Z8249 Family history of ischemic heart disease and other diseases of the circulatory system: Secondary | ICD-10-CM | POA: Insufficient documentation

## 2018-08-03 DIAGNOSIS — G8929 Other chronic pain: Secondary | ICD-10-CM

## 2018-08-03 MED ORDER — HYDROCODONE-ACETAMINOPHEN 10-325 MG PO TABS: 1.0000 | ORAL_TABLET | Freq: Three times a day (TID) | ORAL | 0 refills | Status: DC | PRN

## 2018-08-03 NOTE — Progress Notes (Signed)
Subjective:    Patient ID: [name], male    DOB: [birth year], 48 y.o.   MRN: [id number]  HPI: [name] is a 48 y.o. male who returns for follow up appointment for chronic pain and medication refill. He states his pain is located in his lower back and bilateral knees. He rates his pain 6. His current exercise regime is walking and performing yard work.  [name] Morphine equivalent is 30.00MME.  Last Oral Swab was Performed on 02/02/2018, it was consistent.    Pain Inventory Average Pain 5 Pain Right Now 6 My pain is sharp  In the last 24 hours, has pain interfered with the following? General activity 2 Relation with others 2 Enjoyment of life 2 What TIME of day is your pain at its worst? morning Sleep (in general) Poor  Pain is worse with: walking Pain improves with: rest, heat/ice and medication Relief from Meds: 5  Mobility walk without assistance ability to climb steps?  yes do you drive?  yes Do you have any goals in this area?  yes  Function disabled: date disabled n/a Do you have any goals in this area?  yes  Neuro/Psych trouble walking  Prior Studies no  Physicians involved in your care no   Family History  Problem Relation Age of Onset  . Hypertension Other   . Brain cancer Mother        Died at age 69  . Aneurysm Mother   . CAD Father   . Heart attack Father 90   Social History   Socioeconomic History  . Marital status: Divorced    Spouse name: Not on file  . Number of children: 2  . Years of education: Not on file  . Highest education level: Not on file  Occupational History  . Not on file  Social Needs  . Financial resource strain: Not on file  . Food insecurity    Worry: Not on file    Inability: Not on file  . Transportation needs    Medical: Not on file    Non-medical: Not on file  Tobacco Use  . Smoking status: Former Smoker    Packs/day: 0.50    Years: 13.00    Pack years: 6.50    Types: Cigarettes  .  Smokeless tobacco: Never Used  Substance and Sexual Activity  . Alcohol use: Yes    Comment: Occasional  . Drug use: No  . Sexual activity: Not on file  Lifestyle  . Physical activity    Days per week: Not on file    Minutes per session: Not on file  . Stress: Not on file  Relationships  . Social Herbalist on phone: Not on file    Gets together: Not on file    Attends religious service: Not on file    Active member of club or organization: Not on file    Attends meetings of clubs or organizations: Not on file    Relationship status: Not on file  Other Topics Concern  . Not on file  Social History Narrative  . Not on file   Past Surgical History:  Procedure Laterality Date  . NEPHRECTOMY TRANSPLANTED ORGAN     Past Medical History:  Diagnosis Date  . Anemia   . Collagen vascular disease ([place])   . Lupus ([place])   . Renal disorder   . Renal insufficiency   . Renal transplant recipient    There were no  vitals taken for this visit.  Opioid Risk Score:   Fall Risk Score:  `1  Depression screen PHQ 2/9  Depression screen [facility] 2/9 06/24/2018 04/27/2018  Decreased Interest 0 0  Down, Depressed, Hopeless 0 0  PHQ - 2 Score 0 0     Review of Systems  All other systems reviewed and are negative.      Objective:   Physical Exam Vitals signs and nursing note reviewed.  Constitutional:      Appearance: Normal appearance.  Neck:     Musculoskeletal: Normal range of motion and neck supple.  Cardiovascular:     Rate and Rhythm: Normal rate and regular rhythm.     Pulses: Normal pulses.     Heart sounds: Normal heart sounds.  Pulmonary:     Effort: Pulmonary effort is normal.     Breath sounds: Normal breath sounds.  Musculoskeletal:     Comments: Normal Muscle Bulk and Muscle Testing Reveals:  Upper Extremities: Full ROM and Muscle Strength 5/5 Back without spinal tenderness noted Lower Extremities: Full ROM and Muscle Strength 5/5 Arises from Table with  ease Narrow Based Gait   Skin:    General: Skin is warm and dry.  Neurological:     Mental Status: He is alert and oriented to person, place, and time.  Psychiatric:        Mood and Affect: Mood normal.        Behavior: Behavior normal.           Assessment & Plan:  1. Systemic Lupus Multiple Joint Involvement/ ESRD: Continue HEP as Tolerated. Left AVF+ bruit and thrill.Marland Kitchen 08/03/2018 Rheumatology and Nephrology Following.  2. Tendonitis of both rotator cuffs:No complaints today. Continue HEP as Tolerated. Continue to Monitor.08/03/2018 3. Chronic Pain Syndrome: Refilled: Hydrocodone 10/325 mg one table every 8 hours as needed for pain. #90.  Continue Voltaren Gel. 08/03/2018 We will continue the opioid monitoring program, this consists of regular clinic visits, examinations, urine drug screen, pill counts as well as use of [place] Controlled Substance Reporting system. 4. Polyarthralgia: Continue to Monitor. 08/03/2018 5. Greater Trochanter Bursitis: Continue to Alternate Ice and Heat Therapy. Continue to Monitor.08/03/2018 6. Chronic Bilateral Knee Pain: Continue HEP as Tolerated and Continue to Monitor.08/03/2018 7. Chronic Bilateral Thoracic Pain:No complaints today.Continue current medication regimen. Continue to monitor.08/03/2018  15 minutes of face to face patient care time was spent during this visit. All questions were encouraged and answered.  F/U in 1 month

## 2018-08-24 ENCOUNTER — Ambulatory Visit: Payer: No Typology Code available for payment source | Admitting: Physical Medicine & Rehabilitation

## 2018-09-14 ENCOUNTER — Other Ambulatory Visit: Payer: Self-pay

## 2018-09-14 ENCOUNTER — Encounter
Payer: No Typology Code available for payment source | Attending: Physical Medicine & Rehabilitation | Admitting: Registered Nurse

## 2018-09-14 ENCOUNTER — Encounter: Payer: Self-pay | Admitting: Registered Nurse

## 2018-09-14 VITALS — BP 104/61 | HR 61 | Temp 98.7°F | Resp 18 | Ht 74.0 in | Wt 210.0 lb

## 2018-09-14 DIAGNOSIS — G894 Chronic pain syndrome: Secondary | ICD-10-CM | POA: Diagnosis not present

## 2018-09-14 DIAGNOSIS — M79671 Pain in right foot: Secondary | ICD-10-CM

## 2018-09-14 DIAGNOSIS — Z940 Kidney transplant status: Secondary | ICD-10-CM | POA: Insufficient documentation

## 2018-09-14 DIAGNOSIS — I120 Hypertensive chronic kidney disease with stage 5 chronic kidney disease or end stage renal disease: Secondary | ICD-10-CM | POA: Diagnosis not present

## 2018-09-14 DIAGNOSIS — N186 End stage renal disease: Secondary | ICD-10-CM | POA: Insufficient documentation

## 2018-09-14 DIAGNOSIS — Z5181 Encounter for therapeutic drug level monitoring: Secondary | ICD-10-CM | POA: Insufficient documentation

## 2018-09-14 DIAGNOSIS — M2550 Pain in unspecified joint: Secondary | ICD-10-CM

## 2018-09-14 DIAGNOSIS — G8929 Other chronic pain: Secondary | ICD-10-CM

## 2018-09-14 DIAGNOSIS — M7062 Trochanteric bursitis, left hip: Secondary | ICD-10-CM | POA: Insufficient documentation

## 2018-09-14 DIAGNOSIS — Z7952 Long term (current) use of systemic steroids: Secondary | ICD-10-CM | POA: Diagnosis not present

## 2018-09-14 DIAGNOSIS — M25561 Pain in right knee: Secondary | ICD-10-CM | POA: Diagnosis not present

## 2018-09-14 DIAGNOSIS — M25552 Pain in left hip: Secondary | ICD-10-CM | POA: Insufficient documentation

## 2018-09-14 DIAGNOSIS — M7582 Other shoulder lesions, left shoulder: Secondary | ICD-10-CM | POA: Insufficient documentation

## 2018-09-14 DIAGNOSIS — Z992 Dependence on renal dialysis: Secondary | ICD-10-CM | POA: Diagnosis not present

## 2018-09-14 DIAGNOSIS — M25512 Pain in left shoulder: Secondary | ICD-10-CM | POA: Insufficient documentation

## 2018-09-14 DIAGNOSIS — Z79899 Other long term (current) drug therapy: Secondary | ICD-10-CM | POA: Insufficient documentation

## 2018-09-14 DIAGNOSIS — Z87891 Personal history of nicotine dependence: Secondary | ICD-10-CM | POA: Insufficient documentation

## 2018-09-14 DIAGNOSIS — Z79891 Long term (current) use of opiate analgesic: Secondary | ICD-10-CM | POA: Diagnosis not present

## 2018-09-14 DIAGNOSIS — M545 Low back pain: Secondary | ICD-10-CM | POA: Diagnosis not present

## 2018-09-14 DIAGNOSIS — M25562 Pain in left knee: Secondary | ICD-10-CM | POA: Diagnosis not present

## 2018-09-14 DIAGNOSIS — M79604 Pain in right leg: Secondary | ICD-10-CM

## 2018-09-14 DIAGNOSIS — M7522 Bicipital tendinitis, left shoulder: Secondary | ICD-10-CM | POA: Diagnosis not present

## 2018-09-14 DIAGNOSIS — M7061 Trochanteric bursitis, right hip: Secondary | ICD-10-CM | POA: Diagnosis not present

## 2018-09-14 DIAGNOSIS — Z8249 Family history of ischemic heart disease and other diseases of the circulatory system: Secondary | ICD-10-CM | POA: Diagnosis not present

## 2018-09-14 DIAGNOSIS — M25551 Pain in right hip: Secondary | ICD-10-CM | POA: Insufficient documentation

## 2018-09-14 DIAGNOSIS — M7581 Other shoulder lesions, right shoulder: Secondary | ICD-10-CM | POA: Diagnosis not present

## 2018-09-14 DIAGNOSIS — L930 Discoid lupus erythematosus: Secondary | ICD-10-CM | POA: Diagnosis not present

## 2018-09-14 DIAGNOSIS — M79605 Pain in left leg: Secondary | ICD-10-CM

## 2018-09-14 DIAGNOSIS — M170 Bilateral primary osteoarthritis of knee: Secondary | ICD-10-CM | POA: Insufficient documentation

## 2018-09-14 DIAGNOSIS — M79673 Pain in unspecified foot: Secondary | ICD-10-CM | POA: Diagnosis not present

## 2018-09-14 DIAGNOSIS — M79672 Pain in left foot: Secondary | ICD-10-CM

## 2018-09-14 MED ORDER — HYDROCODONE-ACETAMINOPHEN 10-325 MG PO TABS: 1.0000 | ORAL_TABLET | Freq: Three times a day (TID) | ORAL | 0 refills | Status: DC | PRN

## 2018-09-14 NOTE — Progress Notes (Signed)
Subjective:    Patient ID: [name], male    DOB: 09/12/1970, 48 y.o.   MRN: [id number]  HPI: [name] is a 48 y.o. male who returns for follow up appointment for chronic pain and medication refill. He states his pain is located in his lower back, bilateral lower extremities and bilateral feet. Also reports generalized joint pain all over. He rates his pain 7. His current exercise regime is walking.  [name] Morphine equivalent is  30.00 MME.  Last Oral Swab was Performed on 02/02/2018, it was consistent.   Pain Inventory Average Pain 5 Pain Right Now 7 My pain is sharp and aching  In the last 24 hours, has pain interfered with the following? General activity 3 Relation with others 0 Enjoyment of life 2 What TIME of day is your pain at its worst? morning Sleep (in general) Poor  Pain is worse with: walking Pain improves with: medication Relief from Meds: 5  Mobility walk without assistance how many minutes can you walk? 45 Do you have any goals in this area?  yes  Function disabled: date disabled 2002  Neuro/Psych No problems in this area  Prior Studies Any changes since last visit?  no  Physicians involved in your care Any changes since last visit?  no   Family History  Problem Relation Age of Onset  . Hypertension Other   . Brain cancer Mother        Died at age 78  . Aneurysm Mother   . CAD Father   . Heart attack Father 25   Social History   Socioeconomic History  . Marital status: Divorced    Spouse name: Not on file  . Number of children: 2  . Years of education: Not on file  . Highest education level: Not on file  Occupational History  . Not on file  Social Needs  . Financial resource strain: Not on file  . Food insecurity    Worry: Not on file    Inability: Not on file  . Transportation needs    Medical: Not on file    Non-medical: Not on file  Tobacco Use  . Smoking status: Former Smoker    Packs/day: 0.50    Years:  13.00    Pack years: 6.50    Types: Cigarettes  . Smokeless tobacco: Never Used  Substance and Sexual Activity  . Alcohol use: Yes    Comment: Occasional  . Drug use: No  . Sexual activity: Not on file  Lifestyle  . Physical activity    Days per week: Not on file    Minutes per session: Not on file  . Stress: Not on file  Relationships  . Social Herbalist on phone: Not on file    Gets together: Not on file    Attends religious service: Not on file    Active member of club or organization: Not on file    Attends meetings of clubs or organizations: Not on file    Relationship status: Not on file  Other Topics Concern  . Not on file  Social History Narrative  . Not on file   Past Surgical History:  Procedure Laterality Date  . NEPHRECTOMY TRANSPLANTED ORGAN     Past Medical History:  Diagnosis Date  . Anemia   . Collagen vascular disease ([place])   . Lupus ([place])   . Renal disorder   . Renal insufficiency   . Renal transplant  recipient    There were no vitals taken for this visit.  Opioid Risk Score:   Fall Risk Score:  `1  Depression screen PHQ 2/9  Depression screen [facility] 2/9 06/24/2018 04/27/2018  Decreased Interest 0 0  Down, Depressed, Hopeless 0 0  PHQ - 2 Score 0 0     Review of Systems  Constitutional: Negative.   HENT: Negative.   Eyes: Negative.   Respiratory: Negative.   Cardiovascular: Negative.   Gastrointestinal: Negative.   Endocrine: Negative.   Genitourinary: Negative.   Musculoskeletal: Positive for back pain, gait problem and myalgias.  Skin: Negative.   Allergic/Immunologic: Negative.   Hematological: Negative.   Psychiatric/Behavioral: Negative.   All other systems reviewed and are negative.      Objective:   Physical Exam Vitals signs and nursing note reviewed.  Constitutional:      Appearance: Normal appearance.  Neck:     Musculoskeletal: Normal range of motion and neck supple.  Cardiovascular:     Rate and Rhythm:  Normal rate and regular rhythm.     Pulses: Normal pulses.     Heart sounds: Normal heart sounds.  Pulmonary:     Effort: Pulmonary effort is normal.     Breath sounds: Normal breath sounds.  Musculoskeletal:     Comments: Normal Muscle Bulk and Muscle Testing Reveals:  Upper Extremities: Full ROM and Muscle Strength 5/5 Back without spinal tenderness   Lower Extremities: Full ROM and Muscle Strength 5/5 Arises from Table Slowly Narrow Based Gait   Skin:    General: Skin is warm and dry.  Neurological:     Mental Status: He is alert and oriented to person, place, and time.  Psychiatric:        Mood and Affect: Mood normal.        Behavior: Behavior normal.           Assessment & Plan:  1. Systemic Lupus Multiple Joint Involvement/ ESRD: Continue HEP as Tolerated. Left AVF+ bruit and thrill.Marland Kitchen 09/14/2018 Rheumatology and Nephrology Following.  2. Tendonitis of both rotator cuffs:No complaints today.Continue HEP as Tolerated. Continue to Monitor.09/14/2018 3. Chronic Pain Syndrome: Refilled: Hydrocodone 10/325 mg one table every 8 hours as needed for pain. #90.Continue Voltaren Gel. 09/14/2018 We will continue the opioid monitoring program, this consists of regular clinic visits, examinations, urine drug screen, pill counts as well as use of [place] Controlled Substance Reporting system. 4. Polyarthralgia: Continue to Monitor. 09/14/2018 5. Greater Trochanter Bursitis: No complaints today. Continue to Alternate Ice and Heat Therapy. Continue to Monitor.09/14/2018 6. Chronic Bilateral Knee Pain: Continue HEP as Tolerated and Continue to Monitor.09/14/2018 7. Chronic Bilateral Thoracic Pain:No complaints today.Continue current medication regimen. Continue to monitor.09/14/2018  19minutes of face to face patient care time was spent during this visit. All questions were encouraged and answered.  F/U in 1 month

## 2018-10-14 ENCOUNTER — Encounter
Payer: No Typology Code available for payment source | Attending: Physical Medicine & Rehabilitation | Admitting: Registered Nurse

## 2018-10-14 ENCOUNTER — Encounter: Payer: Self-pay | Admitting: Registered Nurse

## 2018-10-14 ENCOUNTER — Other Ambulatory Visit: Payer: Self-pay

## 2018-10-14 VITALS — BP 137/65 | HR 102 | Temp 97.7°F | Ht 74.0 in | Wt 208.0 lb

## 2018-10-14 DIAGNOSIS — M25551 Pain in right hip: Secondary | ICD-10-CM | POA: Diagnosis not present

## 2018-10-14 DIAGNOSIS — Z87891 Personal history of nicotine dependence: Secondary | ICD-10-CM | POA: Insufficient documentation

## 2018-10-14 DIAGNOSIS — M25512 Pain in left shoulder: Secondary | ICD-10-CM | POA: Diagnosis not present

## 2018-10-14 DIAGNOSIS — M79672 Pain in left foot: Secondary | ICD-10-CM

## 2018-10-14 DIAGNOSIS — M7582 Other shoulder lesions, left shoulder: Secondary | ICD-10-CM | POA: Insufficient documentation

## 2018-10-14 DIAGNOSIS — Z7952 Long term (current) use of systemic steroids: Secondary | ICD-10-CM | POA: Insufficient documentation

## 2018-10-14 DIAGNOSIS — Z79899 Other long term (current) drug therapy: Secondary | ICD-10-CM | POA: Insufficient documentation

## 2018-10-14 DIAGNOSIS — Z79891 Long term (current) use of opiate analgesic: Secondary | ICD-10-CM

## 2018-10-14 DIAGNOSIS — M7062 Trochanteric bursitis, left hip: Secondary | ICD-10-CM | POA: Insufficient documentation

## 2018-10-14 DIAGNOSIS — Z8249 Family history of ischemic heart disease and other diseases of the circulatory system: Secondary | ICD-10-CM | POA: Insufficient documentation

## 2018-10-14 DIAGNOSIS — M25552 Pain in left hip: Secondary | ICD-10-CM | POA: Diagnosis not present

## 2018-10-14 DIAGNOSIS — M7581 Other shoulder lesions, right shoulder: Secondary | ICD-10-CM | POA: Diagnosis not present

## 2018-10-14 DIAGNOSIS — M79671 Pain in right foot: Secondary | ICD-10-CM

## 2018-10-14 DIAGNOSIS — Z940 Kidney transplant status: Secondary | ICD-10-CM | POA: Insufficient documentation

## 2018-10-14 DIAGNOSIS — G8929 Other chronic pain: Secondary | ICD-10-CM

## 2018-10-14 DIAGNOSIS — Z5181 Encounter for therapeutic drug level monitoring: Secondary | ICD-10-CM | POA: Diagnosis not present

## 2018-10-14 DIAGNOSIS — M25561 Pain in right knee: Secondary | ICD-10-CM

## 2018-10-14 DIAGNOSIS — M170 Bilateral primary osteoarthritis of knee: Secondary | ICD-10-CM | POA: Insufficient documentation

## 2018-10-14 DIAGNOSIS — M7061 Trochanteric bursitis, right hip: Secondary | ICD-10-CM | POA: Insufficient documentation

## 2018-10-14 DIAGNOSIS — I120 Hypertensive chronic kidney disease with stage 5 chronic kidney disease or end stage renal disease: Secondary | ICD-10-CM | POA: Diagnosis not present

## 2018-10-14 DIAGNOSIS — M545 Low back pain: Secondary | ICD-10-CM | POA: Diagnosis not present

## 2018-10-14 DIAGNOSIS — Z992 Dependence on renal dialysis: Secondary | ICD-10-CM | POA: Diagnosis not present

## 2018-10-14 DIAGNOSIS — N186 End stage renal disease: Secondary | ICD-10-CM | POA: Insufficient documentation

## 2018-10-14 DIAGNOSIS — M2550 Pain in unspecified joint: Secondary | ICD-10-CM

## 2018-10-14 DIAGNOSIS — L930 Discoid lupus erythematosus: Secondary | ICD-10-CM | POA: Diagnosis not present

## 2018-10-14 DIAGNOSIS — M7522 Bicipital tendinitis, left shoulder: Secondary | ICD-10-CM | POA: Insufficient documentation

## 2018-10-14 DIAGNOSIS — M79673 Pain in unspecified foot: Secondary | ICD-10-CM | POA: Diagnosis not present

## 2018-10-14 DIAGNOSIS — G894 Chronic pain syndrome: Secondary | ICD-10-CM

## 2018-10-14 DIAGNOSIS — M25562 Pain in left knee: Secondary | ICD-10-CM | POA: Diagnosis not present

## 2018-10-14 MED ORDER — HYDROCODONE-ACETAMINOPHEN 10-325 MG PO TABS: 1.0000 | ORAL_TABLET | Freq: Three times a day (TID) | ORAL | 0 refills | Status: DC | PRN

## 2018-10-14 NOTE — Progress Notes (Signed)
Subjective:    Patient ID: [name], male    DOB: 02-17-1970, 48 y.o.   MRN: [id number]  HPI: [name] is a 48 y.o. male who returns for follow up appointment for chronic pain and medication refill. He states his pain is located in his lower back, bilateral knees and bilateral feet. Also reports generalized joint pain.  He rates his pain 6. His current exercise regime is walking and performing stretching exercises.  [name] reports he had a fall two weeks ago, he tripped over a platform in his carport and fell on   [name] Morphine equivalent is 30.00 MME. Oral Swab performed Today.    Pain Inventory Average Pain 5 Pain Right Now 6 My pain is aching  In the last 24 hours, has pain interfered with the following? General activity 2 Relation with others 0 Enjoyment of life 1 What TIME of day is your pain at its worst? morning Sleep (in general) Poor  Pain is worse with: walking Pain improves with: medication Relief from Meds: 4  Mobility walk without assistance how many minutes can you walk? 45 ability to climb steps?  yes do you drive?  yes Do you have any goals in this area?  yes  Function retired Do you have any goals in this area?  yes  Neuro/Psych No problems in this area  Prior Studies Any changes since last visit?  no  Physicians involved in your care Any changes since last visit?  no   Family History  Problem Relation Age of Onset  . Hypertension Other   . Brain cancer Mother        Died at age 79  . Aneurysm Mother   . CAD Father   . Heart attack Father 35   Social History   Socioeconomic History  . Marital status: Divorced    Spouse name: Not on file  . Number of children: 2  . Years of education: Not on file  . Highest education level: Not on file  Occupational History  . Not on file  Social Needs  . Financial resource strain: Not on file  . Food insecurity    Worry: Not on file    Inability: Not on file  .  Transportation needs    Medical: Not on file    Non-medical: Not on file  Tobacco Use  . Smoking status: Former Smoker    Packs/day: 0.50    Years: 13.00    Pack years: 6.50    Types: Cigarettes  . Smokeless tobacco: Never Used  Substance and Sexual Activity  . Alcohol use: Yes    Comment: Occasional  . Drug use: No  . Sexual activity: Not on file  Lifestyle  . Physical activity    Days per week: Not on file    Minutes per session: Not on file  . Stress: Not on file  Relationships  . Social Herbalist on phone: Not on file    Gets together: Not on file    Attends religious service: Not on file    Active member of club or organization: Not on file    Attends meetings of clubs or organizations: Not on file    Relationship status: Not on file  Other Topics Concern  . Not on file  Social History Narrative  . Not on file   Past Surgical History:  Procedure Laterality Date  . NEPHRECTOMY TRANSPLANTED ORGAN     Past Medical  History:  Diagnosis Date  . Anemia   . Collagen vascular disease ([place])   . Lupus ([place])   . Renal disorder   . Renal insufficiency   . Renal transplant recipient    BP 137/65   Pulse (!) 102   Temp 97.7 F (36.5 C)   Ht 6\' 2"  (1.88 m)   Wt 208 lb (94.3 kg)   SpO2 94%   BMI 26.71 kg/m   Opioid Risk Score:   Fall Risk Score:  `1  Depression screen PHQ 2/9  Depression screen [facility] 2/9 06/24/2018 04/27/2018  Decreased Interest 0 0  Down, Depressed, Hopeless 0 0  PHQ - 2 Score 0 0    Review of Systems  Constitutional: Negative.   HENT: Negative.   Eyes: Negative.   Respiratory: Negative.   Cardiovascular: Negative.   Gastrointestinal: Negative.   Endocrine: Negative.   Genitourinary: Negative.   Musculoskeletal: Positive for arthralgias and back pain.  Skin: Negative.   Allergic/Immunologic: Negative.   Neurological: Negative.   Hematological: Negative.   Psychiatric/Behavioral: Negative.   All other systems reviewed and are  negative.      Objective:   Physical Exam Vitals signs and nursing note reviewed.  Constitutional:      Appearance: Normal appearance.  Neck:     Musculoskeletal: Normal range of motion and neck supple.  Cardiovascular:     Rate and Rhythm: Normal rate and regular rhythm.     Pulses: Normal pulses.     Heart sounds: Normal heart sounds.  Pulmonary:     Effort: Pulmonary effort is normal.     Breath sounds: Normal breath sounds.  Musculoskeletal:     Comments: Normal Muscle Bulk and Muscle Testing Reveals:  Upper Extremities:Full  ROM and Muscle Strength 5/5 Back without spinal tenderness noted Lower Extremities: Full ROM and Muscle Strength 5/5 Arises from Table with ease Narrow Based  Gait   Skin:    General: Skin is warm and dry.  Neurological:     Mental Status: He is alert and oriented to person, place, and time.  Psychiatric:        Mood and Affect: Mood normal.        Behavior: Behavior normal.           Assessment & Plan:  1. Systemic Lupus Multiple Joint Involvement/ ESRD: Continue HEP as Tolerated. Left AVF+ bruit and thrill.Marland Kitchen 10/14/2018 Rheumatology and Nephrology Following.  2. Tendonitis of both rotator cuffs:No complaints today.Continue HEP as Tolerated. Continue to Monitor.10/14/2018 3. Chronic Pain Syndrome: Refilled: Hydrocodone 10/325 mg one table every 8 hours as needed for pain. #90.Continue Voltaren Gel. 10/14/2018 We will continue the opioid monitoring program, this consists of regular clinic visits, examinations, urine drug screen, pill counts as well as use of [place] Controlled Substance Reporting system. 4. Polyarthralgia: Continue to Monitor. 10/14/2018 5. Greater Trochanter Bursitis: No complaints today. Continue to Alternate Ice and Heat Therapy. Continue to Monitor.10/14/2018 6. Chronic Bilateral Knee Pain: Continue HEP as Tolerated and Continue to Monitor.10/14/2018 7. Chronic Bilateral Thoracic Pain:No complaints  today.Continue current medication regimen. Continue to monitor.10/14/2018  58minutes of face to face patient care time was spent during this visit. All questions were encouraged and answered.  F/U in 1 month

## 2018-10-17 LAB — DRUG TOX MONITOR 1 W/CONF, ORAL FLD
Amphetamines: NEGATIVE ng/mL (ref <10)
Barbiturates: NEGATIVE ng/mL (ref <10)
Benzodiazepines: NEGATIVE ng/mL (ref <0.50)
Buprenorphine: NEGATIVE ng/mL (ref <0.10)
Cocaine: NEGATIVE ng/mL (ref <5.0)
Codeine: NEGATIVE ng/mL (ref <2.5)
Dihydrocodeine: NEGATIVE ng/mL (ref <2.5)
Fentanyl: NEGATIVE ng/mL (ref <0.10)
Heroin Metabolite: NEGATIVE ng/mL (ref <1.0)
Hydrocodone: 23.1 ng/mL — ABNORMAL HIGH (ref <2.5)
Hydromorphone: NEGATIVE ng/mL (ref <2.5)
MARIJUANA: NEGATIVE ng/mL (ref <2.5)
MDMA: NEGATIVE ng/mL (ref <10)
Meprobamate: NEGATIVE ng/mL (ref <2.5)
Methadone: NEGATIVE ng/mL (ref <5.0)
Morphine: NEGATIVE ng/mL (ref <2.5)
Nicotine Metabolite: NEGATIVE ng/mL (ref <5.0)
Norhydrocodone: NEGATIVE ng/mL (ref <2.5)
Noroxycodone: NEGATIVE ng/mL (ref <2.5)
Opiates: POSITIVE ng/mL — AB (ref <2.5)
Oxycodone: NEGATIVE ng/mL (ref <2.5)
Oxymorphone: NEGATIVE ng/mL (ref <2.5)
Phencyclidine: NEGATIVE ng/mL (ref <10)
Tapentadol: NEGATIVE ng/mL (ref <5.0)
Tramadol: NEGATIVE ng/mL (ref <5.0)
Zolpidem: NEGATIVE ng/mL (ref <5.0)

## 2018-10-17 LAB — DRUG TOX ALC METAB W/CON, ORAL FLD: Alcohol Metabolite: NEGATIVE ng/mL (ref <25)

## 2018-10-20 ENCOUNTER — Telehealth: Payer: Self-pay | Admitting: *Deleted

## 2018-10-20 NOTE — Telephone Encounter (Signed)
Oral swab drug screen was consistent for prescribed medications.  ?

## 2018-11-11 ENCOUNTER — Other Ambulatory Visit: Payer: Self-pay

## 2018-11-11 ENCOUNTER — Encounter
Payer: No Typology Code available for payment source | Attending: Physical Medicine & Rehabilitation | Admitting: Registered Nurse

## 2018-11-11 ENCOUNTER — Encounter: Payer: Self-pay | Admitting: Registered Nurse

## 2018-11-11 VITALS — BP 135/70 | HR 74 | Temp 97.7°F | Ht 74.0 in | Wt 209.0 lb

## 2018-11-11 DIAGNOSIS — M545 Low back pain, unspecified: Secondary | ICD-10-CM

## 2018-11-11 DIAGNOSIS — Z992 Dependence on renal dialysis: Secondary | ICD-10-CM | POA: Diagnosis not present

## 2018-11-11 DIAGNOSIS — M25561 Pain in right knee: Secondary | ICD-10-CM | POA: Diagnosis not present

## 2018-11-11 DIAGNOSIS — M170 Bilateral primary osteoarthritis of knee: Secondary | ICD-10-CM | POA: Insufficient documentation

## 2018-11-11 DIAGNOSIS — Z79891 Long term (current) use of opiate analgesic: Secondary | ICD-10-CM

## 2018-11-11 DIAGNOSIS — G894 Chronic pain syndrome: Secondary | ICD-10-CM | POA: Diagnosis not present

## 2018-11-11 DIAGNOSIS — M25512 Pain in left shoulder: Secondary | ICD-10-CM | POA: Diagnosis not present

## 2018-11-11 DIAGNOSIS — M7061 Trochanteric bursitis, right hip: Secondary | ICD-10-CM | POA: Diagnosis not present

## 2018-11-11 DIAGNOSIS — Z87891 Personal history of nicotine dependence: Secondary | ICD-10-CM | POA: Insufficient documentation

## 2018-11-11 DIAGNOSIS — M7581 Other shoulder lesions, right shoulder: Secondary | ICD-10-CM | POA: Insufficient documentation

## 2018-11-11 DIAGNOSIS — Z8249 Family history of ischemic heart disease and other diseases of the circulatory system: Secondary | ICD-10-CM | POA: Diagnosis not present

## 2018-11-11 DIAGNOSIS — M79671 Pain in right foot: Secondary | ICD-10-CM | POA: Diagnosis not present

## 2018-11-11 DIAGNOSIS — I120 Hypertensive chronic kidney disease with stage 5 chronic kidney disease or end stage renal disease: Secondary | ICD-10-CM | POA: Diagnosis not present

## 2018-11-11 DIAGNOSIS — M25552 Pain in left hip: Secondary | ICD-10-CM | POA: Diagnosis not present

## 2018-11-11 DIAGNOSIS — M25562 Pain in left knee: Secondary | ICD-10-CM | POA: Insufficient documentation

## 2018-11-11 DIAGNOSIS — M79673 Pain in unspecified foot: Secondary | ICD-10-CM | POA: Insufficient documentation

## 2018-11-11 DIAGNOSIS — M25551 Pain in right hip: Secondary | ICD-10-CM | POA: Diagnosis not present

## 2018-11-11 DIAGNOSIS — N186 End stage renal disease: Secondary | ICD-10-CM | POA: Insufficient documentation

## 2018-11-11 DIAGNOSIS — Z5181 Encounter for therapeutic drug level monitoring: Secondary | ICD-10-CM | POA: Diagnosis not present

## 2018-11-11 DIAGNOSIS — Z7952 Long term (current) use of systemic steroids: Secondary | ICD-10-CM | POA: Diagnosis not present

## 2018-11-11 DIAGNOSIS — Z940 Kidney transplant status: Secondary | ICD-10-CM | POA: Insufficient documentation

## 2018-11-11 DIAGNOSIS — M7582 Other shoulder lesions, left shoulder: Secondary | ICD-10-CM | POA: Diagnosis not present

## 2018-11-11 DIAGNOSIS — Z79899 Other long term (current) drug therapy: Secondary | ICD-10-CM | POA: Insufficient documentation

## 2018-11-11 DIAGNOSIS — L930 Discoid lupus erythematosus: Secondary | ICD-10-CM | POA: Diagnosis not present

## 2018-11-11 DIAGNOSIS — G8929 Other chronic pain: Secondary | ICD-10-CM

## 2018-11-11 DIAGNOSIS — M7062 Trochanteric bursitis, left hip: Secondary | ICD-10-CM | POA: Diagnosis not present

## 2018-11-11 DIAGNOSIS — M79672 Pain in left foot: Secondary | ICD-10-CM

## 2018-11-11 DIAGNOSIS — M7522 Bicipital tendinitis, left shoulder: Secondary | ICD-10-CM | POA: Diagnosis not present

## 2018-11-11 MED ORDER — HYDROCODONE-ACETAMINOPHEN 10-325 MG PO TABS: 1.0000 | ORAL_TABLET | Freq: Three times a day (TID) | ORAL | 0 refills | Status: DC | PRN

## 2018-11-11 NOTE — Progress Notes (Signed)
Subjective:    Patient ID: [name], male    DOB: 27-Mar-1970, 48 y.o.   MRN: [id number]  HPI: [name] is a 48 y.o. male who returns for follow up appointment for chronic pain and [name] refill. He states his pain is located in his lower back, bilateral knees and bilateral feet. He rates his pain 6. His. current exercise regime is walking and light yard work.   [name] Morphine equivalent is  30.00MME.  [name], we reviewed the narcotic policy, he verbalizes understanding.   Last Oral Swab was Performed on 10/14/2018, it was consistent.    Pain Inventory Average Pain 5 Pain Right Now 6 My pain is sharp and aching  In the last 24 hours, has pain interfered with the following? General activity 2 Relation with others 0 Enjoyment of life 2 What TIME of day is your pain at its worst? morning Sleep (in general) Poor  Pain is worse with: walking Pain improves with: rest and [name] Relief from Meds: 5  Mobility walk without assistance ability to climb steps?  yes do you drive?  yes  Function disabled: date disabled .  Neuro/Psych No problems in this area  Prior Studies Any changes since last visit?  no  Physicians involved in your care Any changes since last visit?  no   Family History  Problem Relation Age of Onset  . Hypertension Other   . Brain cancer Mother        Died at age 83  . Aneurysm Mother   . CAD Father   . Heart attack Father 47   Social History   Socioeconomic History  . Marital status: Divorced    Spouse name: Not on file  . Number of children: 2  . Years of education: Not on file  . Highest education level: Not on file  Occupational History  . Not on file  Social Needs  . Financial resource strain: Not on file  . Food insecurity    Worry: Not on file    Inability: Not on file  . Transportation needs    Medical: Not on file    Non-medical: Not on file  Tobacco Use  . Smoking status:  Former Smoker    Packs/day: 0.50    Years: 13.00    Pack years: 6.50    Types: Cigarettes  . Smokeless tobacco: Never Used  Substance and Sexual Activity  . Alcohol use: Yes    Comment: Occasional  . Drug use: No  . Sexual activity: Not on file  Lifestyle  . Physical activity    Days per week: Not on file    Minutes per session: Not on file  . Stress: Not on file  Relationships  . Social Herbalist on phone: Not on file    Gets together: Not on file    Attends religious service: Not on file    Active member of club or organization: Not on file    Attends meetings of clubs or organizations: Not on file    Relationship status: Not on file  Other Topics Concern  . Not on file  Social History Narrative  . Not on file   Past Surgical History:  Procedure Laterality Date  . NEPHRECTOMY TRANSPLANTED ORGAN     Past Medical History:  Diagnosis Date  . Anemia   . Collagen vascular disease ([place])   . Lupus ([place])   . Renal disorder   .  Renal insufficiency   . Renal transplant recipient    There were no vitals taken for this visit.  Opioid Risk Score:   Fall Risk Score:  `1  Depression screen PHQ 2/9  Depression screen [facility] 2/9 06/24/2018 04/27/2018  Decreased Interest 0 0  Down, Depressed, Hopeless 0 0  PHQ - 2 Score 0 0     Review of Systems  Constitutional: Negative.   HENT: Negative.   Eyes: Negative.   Respiratory: Negative.   Cardiovascular: Negative.   Gastrointestinal: Negative.   Endocrine: Negative.   Genitourinary: Negative.   Musculoskeletal: Positive for arthralgias, back pain, gait problem and myalgias.  Skin: Negative.   Allergic/Immunologic: Negative.   Hematological: Negative.   Psychiatric/Behavioral: Negative.   All other systems reviewed and are negative.      Objective:   Physical Exam Constitutional:      Appearance: Normal appearance.  Neck:     Musculoskeletal: Normal range of motion and neck supple.  Cardiovascular:      Rate and Rhythm: Normal rate and regular rhythm.     Pulses: Normal pulses.     Heart sounds: Normal heart sounds.  Pulmonary:     Effort: Pulmonary effort is normal.     Breath sounds: Normal breath sounds.  Musculoskeletal:     Comments: Normal Muscle Bulk and Muscle Testing Reveals:  Upper Extremities: Full ROM and Muscle Strength 5/5 Back without spinal tenderness noted Lower Extremities Full ROM and Muscle Strength 5/5 Arises from Table with Ease Narrow Based Gait   Skin:    General: Skin is warm and dry.  Neurological:     Mental Status: He is alert and oriented to person, place, and time.  Psychiatric:        Mood and Affect: Mood normal.        Behavior: Behavior normal.           Assessment & Plan:  1. Systemic Lupus Multiple Joint Involvement/ ESRD: Continue HEP as Tolerated. Left AVF+ bruit and thrill.. 11/11/2018 Rheumatology and Nephrology Following.  2. Tendonitis of both rotator cuffs:No complaints today.Continue HEP as Tolerated. Continue to Monitor.11/11/2018 3. Chronic Pain Syndrome: Refilled: Hydrocodone 10/325 mg one table every 8 hours as needed for pain. #90.Continue Voltaren Gel. 11/11/2018 We will continue the opioid monitoring program, this consists of regular clinic visits, examinations, urine drug screen, pill counts as well as use of [place] Controlled Substance Reporting system. 4. Polyarthralgia: Continue to Monitor. 11/11/2018 5. Greater Trochanter Bursitis:No complaints today.Continue to Alternate Ice and Heat Therapy. Continue to Monitor.11/11/2018 6. Chronic Bilateral Knee Pain: Continue HEP as Tolerated and Continue to Monitor.11/11/2018 7. Chronic Bilateral Thoracic Pain:No complaints today.Continue current [name] regimen. Continue to monitor.11/11/2018  9minutes of face to face patient care time was spent during this visit. All questions were encouraged and answered.  F/U in 1 month

## 2018-12-10 ENCOUNTER — Other Ambulatory Visit: Payer: Self-pay

## 2018-12-10 ENCOUNTER — Encounter: Payer: Self-pay | Admitting: Registered Nurse

## 2018-12-10 ENCOUNTER — Encounter
Payer: No Typology Code available for payment source | Attending: Physical Medicine & Rehabilitation | Admitting: Registered Nurse

## 2018-12-10 VITALS — BP 133/61 | HR 72 | Temp 97.7°F | Ht 74.0 in | Wt 210.0 lb

## 2018-12-10 DIAGNOSIS — M79672 Pain in left foot: Secondary | ICD-10-CM

## 2018-12-10 DIAGNOSIS — L930 Discoid lupus erythematosus: Secondary | ICD-10-CM | POA: Insufficient documentation

## 2018-12-10 DIAGNOSIS — M25561 Pain in right knee: Secondary | ICD-10-CM | POA: Diagnosis not present

## 2018-12-10 DIAGNOSIS — M545 Low back pain, unspecified: Secondary | ICD-10-CM

## 2018-12-10 DIAGNOSIS — M2550 Pain in unspecified joint: Secondary | ICD-10-CM

## 2018-12-10 DIAGNOSIS — Z992 Dependence on renal dialysis: Secondary | ICD-10-CM | POA: Insufficient documentation

## 2018-12-10 DIAGNOSIS — M79673 Pain in unspecified foot: Secondary | ICD-10-CM | POA: Diagnosis not present

## 2018-12-10 DIAGNOSIS — M7581 Other shoulder lesions, right shoulder: Secondary | ICD-10-CM | POA: Insufficient documentation

## 2018-12-10 DIAGNOSIS — M25552 Pain in left hip: Secondary | ICD-10-CM | POA: Insufficient documentation

## 2018-12-10 DIAGNOSIS — M25551 Pain in right hip: Secondary | ICD-10-CM | POA: Insufficient documentation

## 2018-12-10 DIAGNOSIS — Z7952 Long term (current) use of systemic steroids: Secondary | ICD-10-CM | POA: Insufficient documentation

## 2018-12-10 DIAGNOSIS — M7522 Bicipital tendinitis, left shoulder: Secondary | ICD-10-CM | POA: Insufficient documentation

## 2018-12-10 DIAGNOSIS — M79671 Pain in right foot: Secondary | ICD-10-CM

## 2018-12-10 DIAGNOSIS — M7582 Other shoulder lesions, left shoulder: Secondary | ICD-10-CM | POA: Diagnosis not present

## 2018-12-10 DIAGNOSIS — Z940 Kidney transplant status: Secondary | ICD-10-CM | POA: Diagnosis not present

## 2018-12-10 DIAGNOSIS — I120 Hypertensive chronic kidney disease with stage 5 chronic kidney disease or end stage renal disease: Secondary | ICD-10-CM | POA: Diagnosis not present

## 2018-12-10 DIAGNOSIS — Z5181 Encounter for therapeutic drug level monitoring: Secondary | ICD-10-CM

## 2018-12-10 DIAGNOSIS — G894 Chronic pain syndrome: Secondary | ICD-10-CM | POA: Diagnosis not present

## 2018-12-10 DIAGNOSIS — M25512 Pain in left shoulder: Secondary | ICD-10-CM | POA: Insufficient documentation

## 2018-12-10 DIAGNOSIS — G8929 Other chronic pain: Secondary | ICD-10-CM

## 2018-12-10 DIAGNOSIS — M7062 Trochanteric bursitis, left hip: Secondary | ICD-10-CM | POA: Insufficient documentation

## 2018-12-10 DIAGNOSIS — Z79891 Long term (current) use of opiate analgesic: Secondary | ICD-10-CM | POA: Diagnosis not present

## 2018-12-10 DIAGNOSIS — M170 Bilateral primary osteoarthritis of knee: Secondary | ICD-10-CM | POA: Insufficient documentation

## 2018-12-10 DIAGNOSIS — Z87891 Personal history of nicotine dependence: Secondary | ICD-10-CM | POA: Diagnosis not present

## 2018-12-10 DIAGNOSIS — M25562 Pain in left knee: Secondary | ICD-10-CM | POA: Diagnosis not present

## 2018-12-10 DIAGNOSIS — M7061 Trochanteric bursitis, right hip: Secondary | ICD-10-CM | POA: Diagnosis not present

## 2018-12-10 DIAGNOSIS — N186 End stage renal disease: Secondary | ICD-10-CM | POA: Insufficient documentation

## 2018-12-10 DIAGNOSIS — Z8249 Family history of ischemic heart disease and other diseases of the circulatory system: Secondary | ICD-10-CM | POA: Insufficient documentation

## 2018-12-10 DIAGNOSIS — Z79899 Other long term (current) drug therapy: Secondary | ICD-10-CM | POA: Insufficient documentation

## 2018-12-10 MED ORDER — HYDROCODONE-ACETAMINOPHEN 10-325 MG PO TABS: 1.0000 | ORAL_TABLET | Freq: Three times a day (TID) | ORAL | 0 refills | Status: DC | PRN

## 2018-12-10 NOTE — Progress Notes (Signed)
Subjective:    Patient ID: [name], male    DOB: [birth year], 48 y.o.   MRN: [id number]  HPI: [name] is a 48 y.o. male who returns for follow up appointment for chronic pain and medication refill. He states his pain is located in his lower back, bilateral knees and bilateral feet pain. He rates his pain 5. His current exercise regime is walking and performing stretching exercises.  [name] Morphine equivalent is 30.00 MME.  Last Oral Swab was Performed on 10/14/2018, it was consistent.    Pain Inventory Average Pain 5 Pain Right Now 5 My pain is sharp and aching  In the last 24 hours, has pain interfered with the following? General activity 2 Relation with others 0 Enjoyment of life 1 What TIME of day is your pain at its worst? morning Sleep (in general) Poor  Pain is worse with: walking and standing Pain improves with: rest and medication Relief from Meds: 5  Mobility walk without assistance how many minutes can you walk? 40 ability to climb steps?  yes do you drive?  yes Do you have any goals in this area?  yes  Function disabled: date disabled . retired Do you have any goals in this area?  yes  Neuro/Psych No problems in this area  Prior Studies Any changes since last visit?  no  Physicians involved in your care Any changes since last visit?  no   Family History  Problem Relation Age of Onset  . Hypertension Other   . Brain cancer Mother        Died at age 58  . Aneurysm Mother   . CAD Father   . Heart attack Father 32   Social History   Socioeconomic History  . Marital status: Divorced    Spouse name: Not on file  . Number of children: 2  . Years of education: Not on file  . Highest education level: Not on file  Occupational History  . Not on file  Social Needs  . Financial resource strain: Not on file  . Food insecurity    Worry: Not on file    Inability: Not on file  . Transportation needs    Medical: Not on file   Non-medical: Not on file  Tobacco Use  . Smoking status: Former Smoker    Packs/day: 0.50    Years: 13.00    Pack years: 6.50    Types: Cigarettes  . Smokeless tobacco: Never Used  Substance and Sexual Activity  . Alcohol use: Yes    Comment: Occasional  . Drug use: No  . Sexual activity: Not on file  Lifestyle  . Physical activity    Days per week: Not on file    Minutes per session: Not on file  . Stress: Not on file  Relationships  . Social Herbalist on phone: Not on file    Gets together: Not on file    Attends religious service: Not on file    Active member of club or organization: Not on file    Attends meetings of clubs or organizations: Not on file    Relationship status: Not on file  Other Topics Concern  . Not on file  Social History Narrative  . Not on file   Past Surgical History:  Procedure Laterality Date  . NEPHRECTOMY TRANSPLANTED ORGAN     Past Medical History:  Diagnosis Date  . Anemia   . Collagen vascular disease (  [place])   . Lupus ([place])   . Renal disorder   . Renal insufficiency   . Renal transplant recipient    Temp 97.7 F (36.5 C)   Ht 6\' 2"  (1.88 m)   Wt 210 lb (95.3 kg)   BMI 26.96 kg/m   Opioid Risk Score:   Fall Risk Score:  `1  Depression screen PHQ 2/9  Depression screen [facility] 2/9 06/24/2018 04/27/2018  Decreased Interest 0 0  Down, Depressed, Hopeless 0 0  PHQ - 2 Score 0 0    Review of Systems  Constitutional: Negative.   HENT: Negative.   Eyes: Negative.   Respiratory: Negative.   Cardiovascular: Negative.   Gastrointestinal: Negative.   Endocrine: Negative.   Genitourinary: Negative.   Musculoskeletal: Positive for arthralgias and back pain.  Skin: Negative.   Allergic/Immunologic: Negative.   Neurological: Negative.   Hematological: Negative.   Psychiatric/Behavioral: Negative.   All other systems reviewed and are negative.      Objective:   Physical Exam Vitals signs and nursing note reviewed.   Constitutional:      Appearance: Normal appearance.  Neck:     Musculoskeletal: Normal range of motion and neck supple.  Cardiovascular:     Rate and Rhythm: Normal rate and regular rhythm.     Pulses: Normal pulses.     Heart sounds: Normal heart sounds.  Pulmonary:     Effort: Pulmonary effort is normal.     Breath sounds: Normal breath sounds.  Musculoskeletal:     Comments: Normal Muscle Bulk and Muscle Testing Reveals:  Upper Extremities:Full  ROM and Muscle Strength 5/5  Lower Extremities: Full ROM and Muscle Strength 5/5 Arises from Table with Ease Narrow Based Gait   Skin:    General: Skin is warm and dry.  Neurological:     Mental Status: He is alert and oriented to person, place, and time.  Psychiatric:        Mood and Affect: Mood normal.        Behavior: Behavior normal.           Assessment & Plan:  1. Systemic Lupus Multiple Joint Involvement/ ESRD: Continue HEP as Tolerated. Left AVF+ bruit and thrill.. 12/10/2018 Rheumatology and Nephrology Following.  2. Tendonitis of both rotator cuffs:No complaints today.Continue HEP as Tolerated. Continue to Monitor.12/10/2018 3. Chronic Pain Syndrome: Refilled: Hydrocodone 10/325 mg one table every 8 hours as needed for pain. #90.Continue Voltaren Gel. 12/10/2018 We will continue the opioid monitoring program, this consists of regular clinic visits, examinations, urine drug screen, pill counts as well as use of [place] Controlled Substance Reporting system. 4. Polyarthralgia: Continue to Monitor. 12/10/2018 5. Greater Trochanter Bursitis:No complaints today.Continue to Alternate Ice and Heat Therapy. Continue to Monitor.12/10/2018 6. Chronic Bilateral Knee Pain: Continue HEP as Tolerated and Continue to Monitor.12/10/2018 7. Chronic Bilateral Thoracic Pain:No complaints today.Continue current medication regimen. Continue to monitor.12/10/2018  6minutes of face to face patient care time was spent  during this visit. All questions were encouraged and answered.  F/U in 1 month

## 2019-01-10 ENCOUNTER — Other Ambulatory Visit: Payer: Self-pay

## 2019-01-10 ENCOUNTER — Encounter
Payer: No Typology Code available for payment source | Attending: Physical Medicine & Rehabilitation | Admitting: Registered Nurse

## 2019-01-10 ENCOUNTER — Encounter: Payer: Self-pay | Admitting: Registered Nurse

## 2019-01-10 VITALS — BP 122/68 | HR 71 | Temp 97.8°F | Ht 74.0 in | Wt 209.0 lb

## 2019-01-10 DIAGNOSIS — Z8249 Family history of ischemic heart disease and other diseases of the circulatory system: Secondary | ICD-10-CM | POA: Insufficient documentation

## 2019-01-10 DIAGNOSIS — G894 Chronic pain syndrome: Secondary | ICD-10-CM

## 2019-01-10 DIAGNOSIS — M7581 Other shoulder lesions, right shoulder: Secondary | ICD-10-CM | POA: Insufficient documentation

## 2019-01-10 DIAGNOSIS — N186 End stage renal disease: Secondary | ICD-10-CM | POA: Diagnosis not present

## 2019-01-10 DIAGNOSIS — Z79891 Long term (current) use of opiate analgesic: Secondary | ICD-10-CM | POA: Diagnosis not present

## 2019-01-10 DIAGNOSIS — G8929 Other chronic pain: Secondary | ICD-10-CM

## 2019-01-10 DIAGNOSIS — M25512 Pain in left shoulder: Secondary | ICD-10-CM | POA: Insufficient documentation

## 2019-01-10 DIAGNOSIS — M7522 Bicipital tendinitis, left shoulder: Secondary | ICD-10-CM | POA: Diagnosis not present

## 2019-01-10 DIAGNOSIS — Z992 Dependence on renal dialysis: Secondary | ICD-10-CM | POA: Diagnosis not present

## 2019-01-10 DIAGNOSIS — M79673 Pain in unspecified foot: Secondary | ICD-10-CM | POA: Insufficient documentation

## 2019-01-10 DIAGNOSIS — M170 Bilateral primary osteoarthritis of knee: Secondary | ICD-10-CM | POA: Diagnosis not present

## 2019-01-10 DIAGNOSIS — M7061 Trochanteric bursitis, right hip: Secondary | ICD-10-CM | POA: Diagnosis not present

## 2019-01-10 DIAGNOSIS — Z79899 Other long term (current) drug therapy: Secondary | ICD-10-CM | POA: Insufficient documentation

## 2019-01-10 DIAGNOSIS — M79671 Pain in right foot: Secondary | ICD-10-CM | POA: Diagnosis not present

## 2019-01-10 DIAGNOSIS — Z87891 Personal history of nicotine dependence: Secondary | ICD-10-CM | POA: Insufficient documentation

## 2019-01-10 DIAGNOSIS — M545 Low back pain: Secondary | ICD-10-CM

## 2019-01-10 DIAGNOSIS — M79672 Pain in left foot: Secondary | ICD-10-CM

## 2019-01-10 DIAGNOSIS — M7582 Other shoulder lesions, left shoulder: Secondary | ICD-10-CM | POA: Diagnosis not present

## 2019-01-10 DIAGNOSIS — M25552 Pain in left hip: Secondary | ICD-10-CM | POA: Diagnosis not present

## 2019-01-10 DIAGNOSIS — Z5181 Encounter for therapeutic drug level monitoring: Secondary | ICD-10-CM | POA: Diagnosis not present

## 2019-01-10 DIAGNOSIS — Z940 Kidney transplant status: Secondary | ICD-10-CM | POA: Diagnosis not present

## 2019-01-10 DIAGNOSIS — Z7952 Long term (current) use of systemic steroids: Secondary | ICD-10-CM | POA: Insufficient documentation

## 2019-01-10 DIAGNOSIS — M25562 Pain in left knee: Secondary | ICD-10-CM

## 2019-01-10 DIAGNOSIS — L930 Discoid lupus erythematosus: Secondary | ICD-10-CM

## 2019-01-10 DIAGNOSIS — M25551 Pain in right hip: Secondary | ICD-10-CM | POA: Diagnosis not present

## 2019-01-10 DIAGNOSIS — M25561 Pain in right knee: Secondary | ICD-10-CM | POA: Diagnosis not present

## 2019-01-10 DIAGNOSIS — I120 Hypertensive chronic kidney disease with stage 5 chronic kidney disease or end stage renal disease: Secondary | ICD-10-CM | POA: Diagnosis not present

## 2019-01-10 DIAGNOSIS — M7062 Trochanteric bursitis, left hip: Secondary | ICD-10-CM | POA: Insufficient documentation

## 2019-01-10 DIAGNOSIS — M2550 Pain in unspecified joint: Secondary | ICD-10-CM

## 2019-01-10 MED ORDER — HYDROCODONE-ACETAMINOPHEN 10-325 MG PO TABS: 1.0000 | ORAL_TABLET | Freq: Three times a day (TID) | ORAL | 0 refills | Status: DC | PRN

## 2019-01-10 NOTE — Progress Notes (Signed)
Subjective:    Patient ID: [name], male    DOB: 03-Mar-1970, 48 y.o.   MRN: [id number]  HPI: [name] is a 48 y.o. male who returns for follow up appointment for chronic pain and medication refill. He states his pain is located in his lower back, bilateral knees and bilateral feet. He rates his pain 8. His current exercise regime is walking and light yard work.  Left AVF+ bruit and thrill.   [name] Morphine equivalent is 30.00  MME.    Last Oral Swab was Performed on 10/14/2018, it was consistent.   Pain Inventory Average Pain 5 Pain Right Now 8 My pain is sharp and aching  In the last 24 hours, has pain interfered with the following? General activity 1 Relation with others 0 Enjoyment of life 1 What TIME of day is your pain at its worst? . Sleep (in general) Fair  Pain is worse with: walking and standing Pain improves with: rest and medication Relief from Meds: 5  Mobility walk without assistance ability to climb steps?  yes do you drive?  yes  Function disabled: date disabled .  Neuro/Psych No problems in this area  Prior Studies Any changes since last visit?  no  Physicians involved in your care Any changes since last visit?  no   Family History  Problem Relation Age of Onset  . Hypertension Other   . Brain cancer Mother        Died at age 70  . Aneurysm Mother   . CAD Father   . Heart attack Father 85   Social History   Socioeconomic History  . Marital status: Divorced    Spouse name: Not on file  . Number of children: 2  . Years of education: Not on file  . Highest education level: Not on file  Occupational History  . Not on file  Tobacco Use  . Smoking status: Former Smoker    Packs/day: 0.50    Years: 13.00    Pack years: 6.50    Types: Cigarettes  . Smokeless tobacco: Never Used  Substance and Sexual Activity  . Alcohol use: Yes    Comment: Occasional  . Drug use: No  . Sexual activity: Not on file  Other Topics  Concern  . Not on file  Social History Narrative  . Not on file   Social Determinants of Health   Financial Resource Strain:   . Difficulty of Paying Living Expenses: Not on file  Food Insecurity:   . Worried About Charity fundraiser in the Last Year: Not on file  . Ran Out of Food in the Last Year: Not on file  Transportation Needs:   . Lack of Transportation (Medical): Not on file  . Lack of Transportation (Non-Medical): Not on file  Physical Activity:   . Days of Exercise per Week: Not on file  . Minutes of Exercise per Session: Not on file  Stress:   . Feeling of Stress : Not on file  Social Connections:   . Frequency of Communication with Friends and Family: Not on file  . Frequency of Social Gatherings with Friends and Family: Not on file  . Attends Religious Services: Not on file  . Active Member of Clubs or Organizations: Not on file  . Attends Archivist Meetings: Not on file  . Marital Status: Not on file   Past Surgical History:  Procedure Laterality Date  . NEPHRECTOMY TRANSPLANTED ORGAN  Past Medical History:  Diagnosis Date  . Anemia   . Collagen vascular disease ([place])   . Lupus ([place])   . Renal disorder   . Renal insufficiency   . Renal transplant recipient    BP 122/68   Pulse 71   Temp 97.8 F (36.6 C)   Ht 6\' 2"  (1.88 m)   Wt 209 lb (94.8 kg)   SpO2 91%   BMI 26.83 kg/m   Opioid Risk Score:   Fall Risk Score:  `1  Depression screen PHQ 2/9  Depression screen [facility] 2/9 06/24/2018 04/27/2018  Decreased Interest 0 0  Down, Depressed, Hopeless 0 0  PHQ - 2 Score 0 0     Review of Systems  Constitutional: Negative.   HENT: Negative.   Eyes: Negative.   Respiratory: Negative.   Cardiovascular: Negative.   Gastrointestinal: Negative.   Endocrine: Negative.   Genitourinary: Negative.   Musculoskeletal: Positive for arthralgias.  Skin: Negative.   Allergic/Immunologic: Negative.   Neurological: Negative.   Hematological:  Negative.   Psychiatric/Behavioral: Negative.   All other systems reviewed and are negative.      Objective:   Physical Exam Vitals and nursing note reviewed.  Constitutional:      Appearance: Normal appearance.  Cardiovascular:     Rate and Rhythm: Normal rate and regular rhythm.     Pulses: Normal pulses.     Heart sounds: Normal heart sounds.  Pulmonary:     Effort: Pulmonary effort is normal.     Breath sounds: Normal breath sounds.  Musculoskeletal:     Cervical back: Normal range of motion and neck supple.     Comments: Normal Muscle Bulk and Muscle Testing Reveals:  Upper Extremities: Full ROM and Muscle Strength 5/5  Lower Extremities: Full ROM and Muscle Strength 5/5 Arises from Table Slowly Narrow Based  Gait   Skin:    General: Skin is warm and dry.  Neurological:     Mental Status: He is alert and oriented to person, place, and time.  Psychiatric:        Mood and Affect: Mood normal.        Behavior: Behavior normal.           Assessment & Plan:  1. Systemic Lupus Multiple Joint Involvement/ ESRD: Continue HEP as Tolerated. Left AVF+ bruit and thrill..01/10/2019 Rheumatology and Nephrology Following.  2. Tendonitis of both rotator cuffs:No complaints today.Continue HEP as Tolerated. Continue to Monitor.01/10/2019 3. Chronic Pain Syndrome: Refilled: Hydrocodone 10/325 mg one table every 8 hours as needed for pain. #90.Continue Voltaren Gel.01/10/2019 We will continue the opioid monitoring program, this consists of regular clinic visits, examinations, urine drug screen, pill counts as well as use of [place] Controlled Substance Reporting system. 4. Polyarthralgia: Continue to Monitor.01/10/2019 5. Greater Trochanter Bursitis:No complaints today.Continue to Alternate Ice and Heat Therapy. Continue to Monitor.01/10/2019 6. Chronic Bilateral Knee Pain: Continue HEP as Tolerated and Continue to Monitor.01/10/2019 7. Chronic Bilateral Thoracic  Pain:No complaints today.Continue current medication regimen. Continue to monitor.01/10/2019  43minutes of face to face patient care time was spent during this visit. All questions were encouraged and answered.  F/U in 1 month

## 2019-02-02 ENCOUNTER — Other Ambulatory Visit: Payer: Self-pay

## 2019-02-02 ENCOUNTER — Encounter: Payer: Self-pay | Admitting: Registered Nurse

## 2019-02-02 ENCOUNTER — Encounter
Payer: No Typology Code available for payment source | Attending: Physical Medicine & Rehabilitation | Admitting: Registered Nurse

## 2019-02-02 VITALS — BP 148/65 | HR 64 | Temp 97.7°F | Ht 74.0 in | Wt 211.0 lb

## 2019-02-02 DIAGNOSIS — Z992 Dependence on renal dialysis: Secondary | ICD-10-CM | POA: Diagnosis not present

## 2019-02-02 DIAGNOSIS — M79672 Pain in left foot: Secondary | ICD-10-CM

## 2019-02-02 DIAGNOSIS — M7582 Other shoulder lesions, left shoulder: Secondary | ICD-10-CM | POA: Diagnosis not present

## 2019-02-02 DIAGNOSIS — M7061 Trochanteric bursitis, right hip: Secondary | ICD-10-CM | POA: Insufficient documentation

## 2019-02-02 DIAGNOSIS — Z7952 Long term (current) use of systemic steroids: Secondary | ICD-10-CM | POA: Insufficient documentation

## 2019-02-02 DIAGNOSIS — M25561 Pain in right knee: Secondary | ICD-10-CM

## 2019-02-02 DIAGNOSIS — M170 Bilateral primary osteoarthritis of knee: Secondary | ICD-10-CM | POA: Insufficient documentation

## 2019-02-02 DIAGNOSIS — M545 Low back pain, unspecified: Secondary | ICD-10-CM

## 2019-02-02 DIAGNOSIS — M7581 Other shoulder lesions, right shoulder: Secondary | ICD-10-CM | POA: Insufficient documentation

## 2019-02-02 DIAGNOSIS — Z8249 Family history of ischemic heart disease and other diseases of the circulatory system: Secondary | ICD-10-CM | POA: Diagnosis not present

## 2019-02-02 DIAGNOSIS — M25551 Pain in right hip: Secondary | ICD-10-CM | POA: Diagnosis not present

## 2019-02-02 DIAGNOSIS — M25552 Pain in left hip: Secondary | ICD-10-CM | POA: Diagnosis not present

## 2019-02-02 DIAGNOSIS — Z87891 Personal history of nicotine dependence: Secondary | ICD-10-CM | POA: Insufficient documentation

## 2019-02-02 DIAGNOSIS — M25512 Pain in left shoulder: Secondary | ICD-10-CM | POA: Diagnosis not present

## 2019-02-02 DIAGNOSIS — M25562 Pain in left knee: Secondary | ICD-10-CM | POA: Diagnosis not present

## 2019-02-02 DIAGNOSIS — I120 Hypertensive chronic kidney disease with stage 5 chronic kidney disease or end stage renal disease: Secondary | ICD-10-CM | POA: Insufficient documentation

## 2019-02-02 DIAGNOSIS — N186 End stage renal disease: Secondary | ICD-10-CM | POA: Insufficient documentation

## 2019-02-02 DIAGNOSIS — M7522 Bicipital tendinitis, left shoulder: Secondary | ICD-10-CM | POA: Diagnosis not present

## 2019-02-02 DIAGNOSIS — M79671 Pain in right foot: Secondary | ICD-10-CM

## 2019-02-02 DIAGNOSIS — Z940 Kidney transplant status: Secondary | ICD-10-CM | POA: Insufficient documentation

## 2019-02-02 DIAGNOSIS — M2550 Pain in unspecified joint: Secondary | ICD-10-CM

## 2019-02-02 DIAGNOSIS — M79673 Pain in unspecified foot: Secondary | ICD-10-CM | POA: Diagnosis not present

## 2019-02-02 DIAGNOSIS — L930 Discoid lupus erythematosus: Secondary | ICD-10-CM | POA: Diagnosis not present

## 2019-02-02 DIAGNOSIS — Z5181 Encounter for therapeutic drug level monitoring: Secondary | ICD-10-CM | POA: Diagnosis not present

## 2019-02-02 DIAGNOSIS — Z79891 Long term (current) use of opiate analgesic: Secondary | ICD-10-CM | POA: Diagnosis not present

## 2019-02-02 DIAGNOSIS — M7062 Trochanteric bursitis, left hip: Secondary | ICD-10-CM | POA: Diagnosis not present

## 2019-02-02 DIAGNOSIS — Z79899 Other long term (current) drug therapy: Secondary | ICD-10-CM | POA: Insufficient documentation

## 2019-02-02 DIAGNOSIS — G8929 Other chronic pain: Secondary | ICD-10-CM

## 2019-02-02 DIAGNOSIS — G894 Chronic pain syndrome: Secondary | ICD-10-CM | POA: Diagnosis not present

## 2019-02-02 MED ORDER — HYDROCODONE-ACETAMINOPHEN 10-325 MG PO TABS: 1.0000 | ORAL_TABLET | Freq: Three times a day (TID) | ORAL | 0 refills | Status: DC | PRN

## 2019-02-02 NOTE — Progress Notes (Signed)
Subjective:    Patient ID: [name], male    DOB: 07-10-1970, 49 y.o.   MRN: [id number]  HPI: [name] is a 49 y.o. male who returns for follow up appointment for chronic pain and medication refill. He states his pain is located in his lower back, bilateral knees and bilateral feet. He also reports generalized joint pain. He rates his pain 6. His current exercise regime is walking and performing stretching exercises.  [name] Morphine equivalent is 30.00  MME.  Last Oral Swab was Performed on 10/14/2018, it was consistent.    Pain Inventory Average Pain 5 Pain Right Now 6 My pain is sharp and aching  In the last 24 hours, has pain interfered with the following? General activity 1 Relation with others 0 Enjoyment of life 1 What TIME of day is your pain at its worst? morning Sleep (in general) Poor  Pain is worse with: walking and standing Pain improves with: rest and medication Relief from Meds: 5  Mobility walk without assistance how many minutes can you walk? 45 Do you have any goals in this area?  yes  Function disabled: date disabled . Do you have any goals in this area?  yes  Neuro/Psych No problems in this area  Prior Studies Any changes since last visit?  no  Physicians involved in your care Any changes since last visit?  no   Family History  Problem Relation Age of Onset  . Hypertension Other   . Brain cancer Mother        Died at age 48  . Aneurysm Mother   . CAD Father   . Heart attack Father 55   Social History   Socioeconomic History  . Marital status: Divorced    Spouse name: Not on file  . Number of children: 2  . Years of education: Not on file  . Highest education level: Not on file  Occupational History  . Not on file  Tobacco Use  . Smoking status: Former Smoker    Packs/day: 0.50    Years: 13.00    Pack years: 6.50    Types: Cigarettes  . Smokeless tobacco: Never Used  Substance and Sexual Activity  . Alcohol  use: Yes    Comment: Occasional  . Drug use: No  . Sexual activity: Not on file  Other Topics Concern  . Not on file  Social History Narrative  . Not on file   Social Determinants of Health   Financial Resource Strain:   . Difficulty of Paying Living Expenses: Not on file  Food Insecurity:   . Worried About Charity fundraiser in the Last Year: Not on file  . Ran Out of Food in the Last Year: Not on file  Transportation Needs:   . Lack of Transportation (Medical): Not on file  . Lack of Transportation (Non-Medical): Not on file  Physical Activity:   . Days of Exercise per Week: Not on file  . Minutes of Exercise per Session: Not on file  Stress:   . Feeling of Stress : Not on file  Social Connections:   . Frequency of Communication with Friends and Family: Not on file  . Frequency of Social Gatherings with Friends and Family: Not on file  . Attends Religious Services: Not on file  . Active Member of Clubs or Organizations: Not on file  . Attends Archivist Meetings: Not on file  . Marital Status: Not on file  Past Surgical History:  Procedure Laterality Date  . NEPHRECTOMY TRANSPLANTED ORGAN     Past Medical History:  Diagnosis Date  . Anemia   . Collagen vascular disease ([place])   . Lupus ([place])   . Renal disorder   . Renal insufficiency   . Renal transplant recipient    BP (!) 148/65   Pulse (!) 56   Temp 97.7 F (36.5 C)   Ht 6\' 2"  (1.88 m)   Wt 211 lb (95.7 kg)   SpO2 91%   BMI 27.09 kg/m   Opioid Risk Score:   Fall Risk Score:  `1  Depression screen PHQ 2/9  Depression screen [facility] 2/9 06/24/2018 04/27/2018  Decreased Interest 0 0  Down, Depressed, Hopeless 0 0  PHQ - 2 Score 0 0    Review of Systems  Constitutional: Negative.   HENT: Negative.   Eyes: Negative.   Respiratory: Negative.   Cardiovascular: Negative.   Gastrointestinal: Negative.   Endocrine: Negative.   Genitourinary: Negative.   Musculoskeletal: Positive for back pain.   Skin: Negative.   Allergic/Immunologic: Negative.   Neurological: Negative.   Hematological: Negative.   Psychiatric/Behavioral: Negative.   All other systems reviewed and are negative.      Objective:   Physical Exam Vitals and nursing note reviewed.  Constitutional:      Appearance: Normal appearance.  Cardiovascular:     Rate and Rhythm: Normal rate and regular rhythm.     Pulses: Normal pulses.     Heart sounds: Normal heart sounds.  Pulmonary:     Effort: Pulmonary effort is normal.     Breath sounds: Normal breath sounds.  Musculoskeletal:     Cervical back: Normal range of motion and neck supple.     Comments: Normal Muscle Bulk and Muscle Testing Reveals:  Upper Extremities: Full ROM and Muscle Strength 5/5  Back without spinal tenderness   Lower Extremities: Full ROM and Muscle Strength 5/5 Arises from Table with ease Narrow Based Gait   Skin:    General: Skin is warm and dry.  Neurological:     Mental Status: He is alert and oriented to person, place, and time.  Psychiatric:        Mood and Affect: Mood normal.        Behavior: Behavior normal.           Assessment & Plan:  1. Systemic Lupus Multiple Joint Involvement/ ESRD: Continue HEP as Tolerated. Left AVF+ bruit and thrill.Marland Kitchen01/13/2021 Rheumatology and Nephrology Following.  2. Tendonitis of both rotator cuffs:No complaints today.Continue HEP as Tolerated. Continue to Monitor.02/02/2019 3. Chronic Pain Syndrome: Refilled: Hydrocodone 10/325 mg one table every 8 hours as needed for pain. #90.Continue Voltaren Gel.02/02/2019. We will continue the opioid monitoring program, this consists of regular clinic visits, examinations, urine drug screen, pill counts as well as use of [place] Controlled Substance Reporting system. 4. Polyarthralgia: Continue to Monitor.02/02/2019 5. Greater Trochanter Bursitis:No complaints today.Continue to Alternate Ice and Heat Therapy. Continue to  Monitor.02/02/2019 6. Chronic Bilateral Knee Pain: Continue HEP as Tolerated and Continue to Monitor.02/02/2019 7. Chronic Bilateral Thoracic Pain:No complaints today.Continue current medication regimen. Continue to monitor.02/02/2019  22minutes of face to face patient care time was spent during this visit. All questions were encouraged and answered.  F/U in 1 month

## 2019-02-07 ENCOUNTER — Ambulatory Visit (INDEPENDENT_AMBULATORY_CARE_PROVIDER_SITE_OTHER): Payer: No Typology Code available for payment source

## 2019-02-07 ENCOUNTER — Other Ambulatory Visit: Payer: Self-pay

## 2019-02-07 DIAGNOSIS — Z01818 Encounter for other preprocedural examination: Secondary | ICD-10-CM

## 2019-02-07 DIAGNOSIS — Z01810 Encounter for preprocedural cardiovascular examination: Secondary | ICD-10-CM

## 2019-02-09 ENCOUNTER — Other Ambulatory Visit: Payer: No Typology Code available for payment source

## 2019-03-07 ENCOUNTER — Encounter
Payer: No Typology Code available for payment source | Attending: Physical Medicine & Rehabilitation | Admitting: Registered Nurse

## 2019-03-07 ENCOUNTER — Encounter: Payer: Self-pay | Admitting: Registered Nurse

## 2019-03-07 ENCOUNTER — Other Ambulatory Visit: Payer: Self-pay

## 2019-03-07 VITALS — BP 159/74 | HR 76 | Temp 97.9°F | Wt 215.0 lb

## 2019-03-07 DIAGNOSIS — M170 Bilateral primary osteoarthritis of knee: Secondary | ICD-10-CM | POA: Diagnosis not present

## 2019-03-07 DIAGNOSIS — M25552 Pain in left hip: Secondary | ICD-10-CM | POA: Insufficient documentation

## 2019-03-07 DIAGNOSIS — Z79891 Long term (current) use of opiate analgesic: Secondary | ICD-10-CM | POA: Diagnosis not present

## 2019-03-07 DIAGNOSIS — Z5181 Encounter for therapeutic drug level monitoring: Secondary | ICD-10-CM

## 2019-03-07 DIAGNOSIS — M7062 Trochanteric bursitis, left hip: Secondary | ICD-10-CM | POA: Insufficient documentation

## 2019-03-07 DIAGNOSIS — M25561 Pain in right knee: Secondary | ICD-10-CM

## 2019-03-07 DIAGNOSIS — N186 End stage renal disease: Secondary | ICD-10-CM | POA: Insufficient documentation

## 2019-03-07 DIAGNOSIS — Z940 Kidney transplant status: Secondary | ICD-10-CM | POA: Insufficient documentation

## 2019-03-07 DIAGNOSIS — M7581 Other shoulder lesions, right shoulder: Secondary | ICD-10-CM | POA: Insufficient documentation

## 2019-03-07 DIAGNOSIS — M545 Low back pain: Secondary | ICD-10-CM

## 2019-03-07 DIAGNOSIS — Z7952 Long term (current) use of systemic steroids: Secondary | ICD-10-CM | POA: Diagnosis not present

## 2019-03-07 DIAGNOSIS — M79673 Pain in unspecified foot: Secondary | ICD-10-CM | POA: Insufficient documentation

## 2019-03-07 DIAGNOSIS — M25562 Pain in left knee: Secondary | ICD-10-CM

## 2019-03-07 DIAGNOSIS — M7061 Trochanteric bursitis, right hip: Secondary | ICD-10-CM | POA: Diagnosis not present

## 2019-03-07 DIAGNOSIS — M79671 Pain in right foot: Secondary | ICD-10-CM | POA: Diagnosis not present

## 2019-03-07 DIAGNOSIS — M7582 Other shoulder lesions, left shoulder: Secondary | ICD-10-CM | POA: Insufficient documentation

## 2019-03-07 DIAGNOSIS — G894 Chronic pain syndrome: Secondary | ICD-10-CM | POA: Diagnosis not present

## 2019-03-07 DIAGNOSIS — Z8249 Family history of ischemic heart disease and other diseases of the circulatory system: Secondary | ICD-10-CM | POA: Insufficient documentation

## 2019-03-07 DIAGNOSIS — Z87891 Personal history of nicotine dependence: Secondary | ICD-10-CM | POA: Diagnosis not present

## 2019-03-07 DIAGNOSIS — M2550 Pain in unspecified joint: Secondary | ICD-10-CM

## 2019-03-07 DIAGNOSIS — G8929 Other chronic pain: Secondary | ICD-10-CM

## 2019-03-07 DIAGNOSIS — M7522 Bicipital tendinitis, left shoulder: Secondary | ICD-10-CM | POA: Insufficient documentation

## 2019-03-07 DIAGNOSIS — I120 Hypertensive chronic kidney disease with stage 5 chronic kidney disease or end stage renal disease: Secondary | ICD-10-CM | POA: Insufficient documentation

## 2019-03-07 DIAGNOSIS — M25551 Pain in right hip: Secondary | ICD-10-CM | POA: Insufficient documentation

## 2019-03-07 DIAGNOSIS — Z79899 Other long term (current) drug therapy: Secondary | ICD-10-CM | POA: Insufficient documentation

## 2019-03-07 DIAGNOSIS — Z992 Dependence on renal dialysis: Secondary | ICD-10-CM | POA: Insufficient documentation

## 2019-03-07 DIAGNOSIS — M25512 Pain in left shoulder: Secondary | ICD-10-CM | POA: Insufficient documentation

## 2019-03-07 DIAGNOSIS — L930 Discoid lupus erythematosus: Secondary | ICD-10-CM

## 2019-03-07 DIAGNOSIS — M79672 Pain in left foot: Secondary | ICD-10-CM

## 2019-03-07 MED ORDER — HYDROCODONE-ACETAMINOPHEN 10-325 MG PO TABS: 1.0000 | ORAL_TABLET | Freq: Three times a day (TID) | ORAL | 0 refills | Status: DC | PRN

## 2019-03-07 NOTE — Progress Notes (Signed)
Subjective:    Patient ID: [name], male    DOB: Jul 25, 1970, 49 y.o.   MRN: [id number]  HPI: [name] is a 49 y.o. male who returns for follow up appointment for chronic pain and medication refill. He states his pain is located in his lower back, bilateral knees and bilateral feet. Also reports generalized joint pain.He rates his pain 7. His current exercise regime is walking and performing stretching exercises.  Mr. Zeh Morphine equivalent is  30.00  MME.  Last Oral Swab was Performed on 10/14/2018, it was consistent.    Pain Inventory Average Pain 5 Pain Right Now 7 My pain is sharp and aching  In the last 24 hours, has pain interfered with the following? General activity 1 Relation with others 0 Enjoyment of life 1 What TIME of day is your pain at its worst? morning Sleep (in general) Poor  Pain is worse with: walking and standing Pain improves with: rest and medication Relief from Meds: 5  Mobility walk without assistance how many minutes can you walk? 40 ability to climb steps?  no do you drive?  yes Do you have any goals in this area?  yes  Function disabled: date disabled . Do you have any goals in this area?  yes  Neuro/Psych No problems in this area  Prior Studies Any changes since last visit?  no  Physicians involved in your care Any changes since last visit?  no   Family History  Problem Relation Age of Onset  . Hypertension Other   . Brain cancer Mother        Died at age 57  . Aneurysm Mother   . CAD Father   . Heart attack Father 62   Social History   Socioeconomic History  . Marital status: Divorced    Spouse name: Not on file  . Number of children: 2  . Years of education: Not on file  . Highest education level: Not on file  Occupational History  . Not on file  Tobacco Use  . Smoking status: Former Smoker    Packs/day: 0.50    Years: 13.00    Pack years: 6.50    Types: Cigarettes  . Smokeless tobacco: Never Used   Substance and Sexual Activity  . Alcohol use: Yes    Comment: Occasional  . Drug use: No  . Sexual activity: Not on file  Other Topics Concern  . Not on file  Social History Narrative  . Not on file   Social Determinants of Health   Financial Resource Strain:   . Difficulty of Paying Living Expenses: Not on file  Food Insecurity:   . Worried About Charity fundraiser in the Last Year: Not on file  . Ran Out of Food in the Last Year: Not on file  Transportation Needs:   . Lack of Transportation (Medical): Not on file  . Lack of Transportation (Non-Medical): Not on file  Physical Activity:   . Days of Exercise per Week: Not on file  . Minutes of Exercise per Session: Not on file  Stress:   . Feeling of Stress : Not on file  Social Connections:   . Frequency of Communication with Friends and Family: Not on file  . Frequency of Social Gatherings with Friends and Family: Not on file  . Attends Religious Services: Not on file  . Active Member of Clubs or Organizations: Not on file  . Attends Archivist Meetings: Not  on file  . Marital Status: Not on file   Past Surgical History:  Procedure Laterality Date  . NEPHRECTOMY TRANSPLANTED ORGAN     Past Medical History:  Diagnosis Date  . Anemia   . Collagen vascular disease ([place])   . Lupus ([place])   . Renal disorder   . Renal insufficiency   . Renal transplant recipient    BP (!) 180/88   Pulse 76   Temp 97.9 F (36.6 C)   Wt 215 lb (97.5 kg)   SpO2 93%   BMI 27.60 kg/m   Opioid Risk Score:   Fall Risk Score:  `1  Depression screen PHQ 2/9  Depression screen [facility] 2/9 06/24/2018 04/27/2018  Decreased Interest 0 0  Down, Depressed, Hopeless 0 0  PHQ - 2 Score 0 0    Review of Systems  Constitutional: Negative.   HENT: Negative.   Respiratory: Negative.   Cardiovascular: Negative.   Gastrointestinal: Negative.   Endocrine: Negative.   Genitourinary: Negative.   Musculoskeletal: Positive for  arthralgias, back pain and gait problem.  Skin: Negative.   Allergic/Immunologic: Negative.   Hematological: Negative.   Psychiatric/Behavioral: Negative.   All other systems reviewed and are negative.      Objective:   Physical Exam Vitals and nursing note reviewed.  Constitutional:      Appearance: Normal appearance.  Cardiovascular:     Rate and Rhythm: Normal rate and regular rhythm.     Pulses: Normal pulses.     Heart sounds: Normal heart sounds.  Pulmonary:     Effort: Pulmonary effort is normal.     Breath sounds: Normal breath sounds.  Musculoskeletal:     Cervical back: Normal range of motion and neck supple.     Comments: Normal Muscle Bulk and Muscle Testing Reveals:  Upper Extremities: Full ROM and Muscle Strength 5/5  Lumbar Paraspinal Tenderness: L-4-L-5 Lower Extremities: Full ROM and Muscle Strength 5/5 Arises from Table with ease Narrow Based Gait   Skin:    General: Skin is warm and dry.  Neurological:     Mental Status: He is alert and oriented to person, place, and time.  Psychiatric:        Mood and Affect: Mood normal.        Behavior: Behavior normal.           Assessment & Plan:  1. Systemic Lupus Multiple Joint Involvement/ ESRD: Continue HEP as Tolerated. Left AVF+ bruit and thrill.Marland Kitchen02/15/2021 Rheumatology and Nephrology Following.  2. Tendonitis of both rotator cuffs:No complaints today.Continue HEP as Tolerated. Continue to Monitor.03/07/2019 3. Chronic Pain Syndrome: Refilled: Hydrocodone 10/325 mg one table every 8 hours as needed for pain. #90.Continue Voltaren Gel.03/07/2019. We will continue the opioid monitoring program, this consists of regular clinic visits, examinations, urine drug screen, pill counts as well as use of [place] Controlled Substance Reporting system. 4. Polyarthralgia: Continue to Monitor.03/07/2019 5. Greater Trochanter Bursitis:No complaints today.Continue to Alternate Ice and Heat Therapy.  Continue to Monitor.03/07/2019 6. Chronic Bilateral Knee Pain: Continue HEP as Tolerated and Continue to Monitor.03/07/2019 7. Chronic Bilateral Thoracic Pain:No complaints today.Continue current medication regimen. Continue to monitor.03/07/2019  74minutes of face to face patient care time was spent during this visit. All questions were encouraged and answered.  F/U in 1 month

## 2019-03-31 ENCOUNTER — Ambulatory Visit: Payer: No Typology Code available for payment source | Admitting: Cardiology

## 2019-04-05 ENCOUNTER — Encounter: Payer: Self-pay | Admitting: Registered Nurse

## 2019-04-05 ENCOUNTER — Encounter
Payer: No Typology Code available for payment source | Attending: Physical Medicine & Rehabilitation | Admitting: Registered Nurse

## 2019-04-05 ENCOUNTER — Other Ambulatory Visit: Payer: Self-pay

## 2019-04-05 VITALS — BP 108/62 | HR 79 | Temp 97.5°F | Ht 74.0 in | Wt 211.0 lb

## 2019-04-05 DIAGNOSIS — M25561 Pain in right knee: Secondary | ICD-10-CM | POA: Diagnosis not present

## 2019-04-05 DIAGNOSIS — M25552 Pain in left hip: Secondary | ICD-10-CM | POA: Diagnosis not present

## 2019-04-05 DIAGNOSIS — G894 Chronic pain syndrome: Secondary | ICD-10-CM

## 2019-04-05 DIAGNOSIS — M170 Bilateral primary osteoarthritis of knee: Secondary | ICD-10-CM | POA: Diagnosis not present

## 2019-04-05 DIAGNOSIS — M79673 Pain in unspecified foot: Secondary | ICD-10-CM | POA: Insufficient documentation

## 2019-04-05 DIAGNOSIS — M25512 Pain in left shoulder: Secondary | ICD-10-CM | POA: Insufficient documentation

## 2019-04-05 DIAGNOSIS — Z79891 Long term (current) use of opiate analgesic: Secondary | ICD-10-CM

## 2019-04-05 DIAGNOSIS — M7582 Other shoulder lesions, left shoulder: Secondary | ICD-10-CM | POA: Insufficient documentation

## 2019-04-05 DIAGNOSIS — M25551 Pain in right hip: Secondary | ICD-10-CM | POA: Insufficient documentation

## 2019-04-05 DIAGNOSIS — G8929 Other chronic pain: Secondary | ICD-10-CM

## 2019-04-05 DIAGNOSIS — Z5181 Encounter for therapeutic drug level monitoring: Secondary | ICD-10-CM | POA: Diagnosis present

## 2019-04-05 DIAGNOSIS — M7062 Trochanteric bursitis, left hip: Secondary | ICD-10-CM | POA: Insufficient documentation

## 2019-04-05 DIAGNOSIS — Z79899 Other long term (current) drug therapy: Secondary | ICD-10-CM | POA: Insufficient documentation

## 2019-04-05 DIAGNOSIS — N186 End stage renal disease: Secondary | ICD-10-CM | POA: Diagnosis not present

## 2019-04-05 DIAGNOSIS — M7061 Trochanteric bursitis, right hip: Secondary | ICD-10-CM | POA: Insufficient documentation

## 2019-04-05 DIAGNOSIS — I120 Hypertensive chronic kidney disease with stage 5 chronic kidney disease or end stage renal disease: Secondary | ICD-10-CM | POA: Diagnosis not present

## 2019-04-05 DIAGNOSIS — Z7952 Long term (current) use of systemic steroids: Secondary | ICD-10-CM | POA: Insufficient documentation

## 2019-04-05 DIAGNOSIS — L930 Discoid lupus erythematosus: Secondary | ICD-10-CM | POA: Diagnosis not present

## 2019-04-05 DIAGNOSIS — M7581 Other shoulder lesions, right shoulder: Secondary | ICD-10-CM | POA: Insufficient documentation

## 2019-04-05 DIAGNOSIS — Z992 Dependence on renal dialysis: Secondary | ICD-10-CM | POA: Insufficient documentation

## 2019-04-05 DIAGNOSIS — M7522 Bicipital tendinitis, left shoulder: Secondary | ICD-10-CM | POA: Insufficient documentation

## 2019-04-05 DIAGNOSIS — M79671 Pain in right foot: Secondary | ICD-10-CM | POA: Diagnosis not present

## 2019-04-05 DIAGNOSIS — Z8249 Family history of ischemic heart disease and other diseases of the circulatory system: Secondary | ICD-10-CM | POA: Diagnosis not present

## 2019-04-05 DIAGNOSIS — M79672 Pain in left foot: Secondary | ICD-10-CM

## 2019-04-05 DIAGNOSIS — Z87891 Personal history of nicotine dependence: Secondary | ICD-10-CM | POA: Insufficient documentation

## 2019-04-05 DIAGNOSIS — Z940 Kidney transplant status: Secondary | ICD-10-CM | POA: Diagnosis not present

## 2019-04-05 DIAGNOSIS — M2550 Pain in unspecified joint: Secondary | ICD-10-CM

## 2019-04-05 DIAGNOSIS — M545 Low back pain: Secondary | ICD-10-CM | POA: Diagnosis not present

## 2019-04-05 DIAGNOSIS — M25562 Pain in left knee: Secondary | ICD-10-CM | POA: Insufficient documentation

## 2019-04-05 MED ORDER — HYDROCODONE-ACETAMINOPHEN 10-325 MG PO TABS: 1.0000 | ORAL_TABLET | Freq: Three times a day (TID) | ORAL | 0 refills | Status: DC | PRN

## 2019-04-05 NOTE — Progress Notes (Signed)
Subjective:    Patient ID: [name], male    DOB: 1970/01/28, 49 y.o.   MRN: [id number]  HPI: [name] is a 49 y.o. male who returns for follow up appointment for chronic pain and medication refill. He states his pain is located in his lower back, bilateral knees and bilateral feet. He rates his pain 7. His current exercise regime is walking and performing stretching exercises.  Mr. Damore Morphine equivalent is 30.00 MME.  Oral Swab Performed Today.    Pain Inventory Average Pain 5 Pain Right Now 7 My pain is constant and aching  In the last 24 hours, has pain interfered with the following? General activity 1 Relation with others 1 Enjoyment of life 1 What TIME of day is your pain at its worst? morning Sleep (in general) Poor  Pain is worse with: walking Pain improves with: rest and medication Relief from Meds: 5  Mobility walk without assistance how many minutes can you walk? 40 ability to climb steps?  no do you drive?  yes transfers alone Do you have any goals in this area?  yes  Function disabled: date disabled . Do you have any goals in this area?  no  Neuro/Psych No problems in this area  Prior Studies Any changes since last visit?  no  Physicians involved in your care Any changes since last visit?  no   Family History  Problem Relation Age of Onset  . Hypertension Other   . Brain cancer Mother        Died at age 22  . Aneurysm Mother   . CAD Father   . Heart attack Father 57   Social History   Socioeconomic History  . Marital status: Divorced    Spouse name: Not on file  . Number of children: 2  . Years of education: Not on file  . Highest education level: Not on file  Occupational History  . Not on file  Tobacco Use  . Smoking status: Former Smoker    Packs/day: 0.50    Years: 13.00    Pack years: 6.50    Types: Cigarettes  . Smokeless tobacco: Never Used  Substance and Sexual Activity  . Alcohol use: Yes    Comment:  Occasional  . Drug use: No  . Sexual activity: Not on file  Other Topics Concern  . Not on file  Social History Narrative  . Not on file   Social Determinants of Health   Financial Resource Strain:   . Difficulty of Paying Living Expenses:   Food Insecurity:   . Worried About Charity fundraiser in the Last Year:   . Arboriculturist in the Last Year:   Transportation Needs:   . Film/video editor (Medical):   Marland Kitchen Lack of Transportation (Non-Medical):   Physical Activity:   . Days of Exercise per Week:   . Minutes of Exercise per Session:   Stress:   . Feeling of Stress :   Social Connections:   . Frequency of Communication with Friends and Family:   . Frequency of Social Gatherings with Friends and Family:   . Attends Religious Services:   . Active Member of Clubs or Organizations:   . Attends Archivist Meetings:   Marland Kitchen Marital Status:    Past Surgical History:  Procedure Laterality Date  . NEPHRECTOMY TRANSPLANTED ORGAN     Past Medical History:  Diagnosis Date  . Anemia   . Collagen  vascular disease ([place])   . Lupus ([place])   . Renal disorder   . Renal insufficiency   . Renal transplant recipient    BP 108/62   Pulse 79   Temp (!) 97.5 F (36.4 C)   Ht 6\' 2"  (1.88 m)   Wt 211 lb (95.7 kg)   SpO2 94%   BMI 27.09 kg/m   Opioid Risk Score:   Fall Risk Score:  `1  Depression screen PHQ 2/9  Depression screen [facility] 2/9 06/24/2018 04/27/2018  Decreased Interest 0 0  Down, Depressed, Hopeless 0 0  PHQ - 2 Score 0 0    Review of Systems  Constitutional: Negative.   HENT: Negative.   Eyes: Negative.   Respiratory: Negative.   Cardiovascular: Negative.   Gastrointestinal: Negative.   Endocrine: Negative.   Genitourinary: Negative.   Musculoskeletal: Positive for arthralgias and back pain.  Skin: Negative.   Allergic/Immunologic: Negative.   Neurological: Negative.   Hematological: Negative.   Psychiatric/Behavioral: Negative.   All other  systems reviewed and are negative.      Objective:   Physical Exam Vitals and nursing note reviewed.  Constitutional:      Appearance: Normal appearance.  Cardiovascular:     Rate and Rhythm: Normal rate and regular rhythm.     Pulses: Normal pulses.     Heart sounds: Normal heart sounds.  Pulmonary:     Effort: Pulmonary effort is normal.     Breath sounds: Normal breath sounds.  Musculoskeletal:     Cervical back: Normal range of motion and neck supple.     Comments: Normal Muscle Bulk and Muscle Testing Reveals:  Upper Extremities: Full ROM and Muscle Strength 5/5  Lumbar Paraspinal Tenderness: L-4-L-5 Lower Extremities: Full ROM and Muscle Strength 5/5 Arises from Table with Ease Narrow Based Gait   Skin:    General: Skin is warm and dry.  Neurological:     Mental Status: He is alert and oriented to person, place, and time.           Assessment & Plan:  1. Systemic Lupus Multiple Joint Involvement/ ESRD: Continue HEP as Tolerated. Left AVF+ bruit and thrill.Marland Kitchen03/16/2021 Rheumatology and Nephrology Following.  2. Tendonitis of both rotator cuffs:No complaints today.Continue HEP as Tolerated. Continue to Monitor.04/05/2019 3. Chronic Pain Syndrome: Refilled: Hydrocodone 10/325 mg one table every 8 hours as needed for pain. #90.Continue Voltaren Gel.04/05/2019. We will continue the opioid monitoring program, this consists of regular clinic visits, examinations, urine drug screen, pill counts as well as use of [place] Controlled Substance Reporting system. 4. Polyarthralgia: Continue to Monitor.04/05/2019 5. Greater Trochanter Bursitis:No complaints today.Continue to Alternate Ice and Heat Therapy. Continue to Monitor.04/05/2019 6. Chronic Bilateral Knee Pain: Continue HEP as Tolerated and Continue to Monitor.04/05/2019 7. Chronic Bilateral Thoracic Pain:No complaints today.Continue current medication regimen. Continue to  monitor.04/05/2019  28minutes of face to face patient care time was spent during this visit. All questions were encouraged and answered.  F/U in 1 month

## 2019-04-09 LAB — DRUG TOX MONITOR 1 W/CONF, ORAL FLD
Amphetamines: NEGATIVE ng/mL (ref <10)
Barbiturates: NEGATIVE ng/mL (ref <10)
Benzodiazepines: NEGATIVE ng/mL (ref <0.50)
Buprenorphine: NEGATIVE ng/mL (ref <0.10)
Cocaine: NEGATIVE ng/mL (ref <5.0)
Codeine: NEGATIVE ng/mL (ref <2.5)
Cotinine: 20 ng/mL — ABNORMAL HIGH (ref <5.0)
Dihydrocodeine: 7.9 ng/mL — ABNORMAL HIGH (ref <2.5)
Fentanyl: NEGATIVE ng/mL (ref <0.10)
Heroin Metabolite: NEGATIVE ng/mL (ref <1.0)
Hydrocodone: 233.3 ng/mL — ABNORMAL HIGH (ref <2.5)
Hydromorphone: NEGATIVE ng/mL (ref <2.5)
MARIJUANA: NEGATIVE ng/mL (ref <2.5)
MDMA: NEGATIVE ng/mL (ref <10)
Meprobamate: NEGATIVE ng/mL (ref <2.5)
Methadone: NEGATIVE ng/mL (ref <5.0)
Morphine: NEGATIVE ng/mL (ref <2.5)
Nicotine Metabolite: POSITIVE ng/mL — AB (ref <5.0)
Norhydrocodone: 12.5 ng/mL — ABNORMAL HIGH (ref <2.5)
Noroxycodone: NEGATIVE ng/mL (ref <2.5)
Opiates: POSITIVE ng/mL — AB (ref <2.5)
Oxycodone: NEGATIVE ng/mL (ref <2.5)
Oxymorphone: NEGATIVE ng/mL (ref <2.5)
Phencyclidine: NEGATIVE ng/mL (ref <10)
Tapentadol: NEGATIVE ng/mL (ref <5.0)
Tramadol: NEGATIVE ng/mL (ref <5.0)
Zolpidem: NEGATIVE ng/mL (ref <5.0)

## 2019-04-09 LAB — DRUG TOX ALC METAB W/CON, ORAL FLD: Alcohol Metabolite: NEGATIVE ng/mL (ref <25)

## 2019-04-12 ENCOUNTER — Telehealth: Payer: Self-pay | Admitting: Registered Nurse

## 2019-04-12 NOTE — Telephone Encounter (Signed)
Oral Swab was Performed on 04/05/2019, it was consistent for prescribe medication.

## 2019-05-03 ENCOUNTER — Encounter
Payer: No Typology Code available for payment source | Attending: Physical Medicine & Rehabilitation | Admitting: Registered Nurse

## 2019-05-03 ENCOUNTER — Encounter: Payer: Self-pay | Admitting: Registered Nurse

## 2019-05-03 ENCOUNTER — Other Ambulatory Visit: Payer: Self-pay

## 2019-05-03 VITALS — BP 153/77 | HR 75 | Temp 97.5°F | Ht 74.0 in | Wt 218.2 lb

## 2019-05-03 DIAGNOSIS — M7581 Other shoulder lesions, right shoulder: Secondary | ICD-10-CM | POA: Diagnosis not present

## 2019-05-03 DIAGNOSIS — I120 Hypertensive chronic kidney disease with stage 5 chronic kidney disease or end stage renal disease: Secondary | ICD-10-CM | POA: Diagnosis not present

## 2019-05-03 DIAGNOSIS — M25551 Pain in right hip: Secondary | ICD-10-CM | POA: Diagnosis not present

## 2019-05-03 DIAGNOSIS — Z79891 Long term (current) use of opiate analgesic: Secondary | ICD-10-CM

## 2019-05-03 DIAGNOSIS — L930 Discoid lupus erythematosus: Secondary | ICD-10-CM | POA: Diagnosis not present

## 2019-05-03 DIAGNOSIS — M25562 Pain in left knee: Secondary | ICD-10-CM | POA: Diagnosis not present

## 2019-05-03 DIAGNOSIS — M545 Low back pain: Secondary | ICD-10-CM | POA: Diagnosis not present

## 2019-05-03 DIAGNOSIS — M170 Bilateral primary osteoarthritis of knee: Secondary | ICD-10-CM | POA: Diagnosis not present

## 2019-05-03 DIAGNOSIS — Z992 Dependence on renal dialysis: Secondary | ICD-10-CM | POA: Insufficient documentation

## 2019-05-03 DIAGNOSIS — M7061 Trochanteric bursitis, right hip: Secondary | ICD-10-CM | POA: Diagnosis not present

## 2019-05-03 DIAGNOSIS — M25512 Pain in left shoulder: Secondary | ICD-10-CM | POA: Diagnosis not present

## 2019-05-03 DIAGNOSIS — M7522 Bicipital tendinitis, left shoulder: Secondary | ICD-10-CM | POA: Diagnosis not present

## 2019-05-03 DIAGNOSIS — M79673 Pain in unspecified foot: Secondary | ICD-10-CM | POA: Insufficient documentation

## 2019-05-03 DIAGNOSIS — M79671 Pain in right foot: Secondary | ICD-10-CM | POA: Diagnosis not present

## 2019-05-03 DIAGNOSIS — Z7952 Long term (current) use of systemic steroids: Secondary | ICD-10-CM | POA: Insufficient documentation

## 2019-05-03 DIAGNOSIS — G894 Chronic pain syndrome: Secondary | ICD-10-CM

## 2019-05-03 DIAGNOSIS — N186 End stage renal disease: Secondary | ICD-10-CM | POA: Insufficient documentation

## 2019-05-03 DIAGNOSIS — G8929 Other chronic pain: Secondary | ICD-10-CM | POA: Diagnosis present

## 2019-05-03 DIAGNOSIS — Z940 Kidney transplant status: Secondary | ICD-10-CM | POA: Diagnosis not present

## 2019-05-03 DIAGNOSIS — M7582 Other shoulder lesions, left shoulder: Secondary | ICD-10-CM | POA: Diagnosis not present

## 2019-05-03 DIAGNOSIS — Z5181 Encounter for therapeutic drug level monitoring: Secondary | ICD-10-CM | POA: Diagnosis present

## 2019-05-03 DIAGNOSIS — M7062 Trochanteric bursitis, left hip: Secondary | ICD-10-CM | POA: Diagnosis not present

## 2019-05-03 DIAGNOSIS — M25552 Pain in left hip: Secondary | ICD-10-CM | POA: Diagnosis not present

## 2019-05-03 DIAGNOSIS — Z8249 Family history of ischemic heart disease and other diseases of the circulatory system: Secondary | ICD-10-CM | POA: Insufficient documentation

## 2019-05-03 DIAGNOSIS — M25561 Pain in right knee: Secondary | ICD-10-CM | POA: Diagnosis not present

## 2019-05-03 DIAGNOSIS — Z87891 Personal history of nicotine dependence: Secondary | ICD-10-CM | POA: Insufficient documentation

## 2019-05-03 DIAGNOSIS — M79672 Pain in left foot: Secondary | ICD-10-CM

## 2019-05-03 DIAGNOSIS — M2550 Pain in unspecified joint: Secondary | ICD-10-CM

## 2019-05-03 DIAGNOSIS — Z79899 Other long term (current) drug therapy: Secondary | ICD-10-CM | POA: Insufficient documentation

## 2019-05-03 MED ORDER — HYDROCODONE-ACETAMINOPHEN 10-325 MG PO TABS: 1.0000 | ORAL_TABLET | Freq: Three times a day (TID) | ORAL | 0 refills | Status: DC | PRN

## 2019-05-03 NOTE — Progress Notes (Signed)
Subjective:    Patient ID: [name], male    DOB: Oct 05, 1970, 49 y.o.   MRN: [id number]  HPI: [name] is a 49 y.o. male who returns for follow up appointment for chronic pain and medication refill. He states his pain is located in his lower back, bilateral knees and bilateral feet pain. Also reports generalized joint pain.He rates his  Pain 6. His  current exercise regime is walking and performing stretching exercises.  Mr. Riolo Morphine equivalent is 30.00  MME.    Last Oral Swab was Performed on 04/05/2019, it was consistent.   Pain Inventory Average Pain 5 Pain Right Now 6 My pain is sharp and aching  In the last 24 hours, has pain interfered with the following? General activity 0 Relation with others 1 Enjoyment of life 0 What TIME of day is your pain at its worst? morning Sleep (in general) Poor  Pain is worse with: walking Pain improves with: rest and medication Relief from Meds: 5  Mobility walk without assistance how many minutes can you walk? 45 ability to climb steps?  no do you drive?  yes  Function disabled: date disabled . Do you have any goals in this area?  yes  Neuro/Psych No problems in this area  Prior Studies Any changes since last visit?  no  Physicians involved in your care Any changes since last visit?  no   Family History  Problem Relation Age of Onset  . Hypertension Other   . Brain cancer Mother        Died at age 28  . Aneurysm Mother   . CAD Father   . Heart attack Father 67   Social History   Socioeconomic History  . Marital status: Divorced    Spouse name: Not on file  . Number of children: 2  . Years of education: Not on file  . Highest education level: Not on file  Occupational History  . Not on file  Tobacco Use  . Smoking status: Former Smoker    Packs/day: 0.50    Years: 13.00    Pack years: 6.50    Types: Cigarettes  . Smokeless tobacco: Never Used  Substance and Sexual Activity  . Alcohol  use: Yes    Comment: Occasional  . Drug use: No  . Sexual activity: Not on file  Other Topics Concern  . Not on file  Social History Narrative  . Not on file   Social Determinants of Health   Financial Resource Strain:   . Difficulty of Paying Living Expenses:   Food Insecurity:   . Worried About Charity fundraiser in the Last Year:   . Arboriculturist in the Last Year:   Transportation Needs:   . Film/video editor (Medical):   Marland Kitchen Lack of Transportation (Non-Medical):   Physical Activity:   . Days of Exercise per Week:   . Minutes of Exercise per Session:   Stress:   . Feeling of Stress :   Social Connections:   . Frequency of Communication with Friends and Family:   . Frequency of Social Gatherings with Friends and Family:   . Attends Religious Services:   . Active Member of Clubs or Organizations:   . Attends Archivist Meetings:   Marland Kitchen Marital Status:    Past Surgical History:  Procedure Laterality Date  . NEPHRECTOMY TRANSPLANTED ORGAN     Past Medical History:  Diagnosis Date  . Anemia   .  Collagen vascular disease ([place])   . Lupus ([place])   . Renal disorder   . Renal insufficiency   . Renal transplant recipient    BP (!) 153/77   Pulse 75   Temp (!) 97.5 F (36.4 C)   Ht 6\' 2"  (1.88 m)   Wt 218 lb 3.2 oz (99 kg)   SpO2 92%   BMI 28.02 kg/m   Opioid Risk Score:   Fall Risk Score:  `1  Depression screen PHQ 2/9  Depression screen [facility] 2/9 05/03/2019 06/24/2018 04/27/2018  Decreased Interest 0 0 0  Down, Depressed, Hopeless 0 0 0  PHQ - 2 Score 0 0 0    Review of Systems  Constitutional: Negative.   HENT: Negative.   Eyes: Negative.   Respiratory: Negative.   Cardiovascular: Negative.   Gastrointestinal: Negative.   Endocrine: Negative.   Genitourinary: Negative.   Musculoskeletal: Negative.   Skin: Negative.   Allergic/Immunologic: Negative.   Neurological: Negative.   Hematological: Negative.   Psychiatric/Behavioral: Negative.     All other systems reviewed and are negative.      Objective:   Physical Exam Vitals and nursing note reviewed.  Constitutional:      Appearance: Normal appearance.  Cardiovascular:     Rate and Rhythm: Normal rate and regular rhythm.     Pulses: Normal pulses.     Heart sounds: Normal heart sounds.  Pulmonary:     Effort: Pulmonary effort is normal.     Breath sounds: Normal breath sounds.  Musculoskeletal:     Cervical back: Normal range of motion and neck supple.     Comments: Normal Muscle Bulk and Muscle Testing Reveals:  Upper Extremities: Full ROM and Muscle Strength 5/5 Lumbar Paraspinal Tenderness: L-4-L-5 Lower Extremities: Full ROM and Muscle Strength 5/5 Arises from Table with ease Narrow Based Gait   Skin:    General: Skin is warm and dry.  Neurological:     Mental Status: He is alert and oriented to person, place, and time.  Psychiatric:        Mood and Affect: Mood normal.        Behavior: Behavior normal.           Assessment & Plan:  1. Systemic Lupus Multiple Joint Involvement/ ESRD: Continue HEP as Tolerated. Left AVF+ bruit and thrill.Marland Kitchen04/13/2021 Rheumatology and Nephrology Following.  2. Tendonitis of both rotator cuffs:No complaints today.Continue HEP as Tolerated. Continue to Monitor.05/03/2019 3. Chronic Pain Syndrome: Refilled: Hydrocodone 10/325 mg one table every 8 hours as needed for pain. #90.Continue Voltaren Gel.05/03/2019. We will continue the opioid monitoring program, this consists of regular clinic visits, examinations, urine drug screen, pill counts as well as use of [place] Controlled Substance Reporting system. 4. Polyarthralgia: Continue to Monitor.05/03/2019 5. Greater Trochanter Bursitis:No complaints today.Continue to Alternate Ice and Heat Therapy. Continue to Monitor.05/03/2019 6. Chronic Bilateral Knee Pain: Continue HEP as Tolerated and Continue to Monitor.05/03/2019 7.Bilateral feet Pain: Continue HEP  as Tolerated. Continue to Monitor.  7. Chronic Bilateral Thoracic Pain:No complaints today.Continue current medication regimen. Continue to monitor.04/05/2019  39minutes of face to face patient care time was spent during this visit. All questions were encouraged and answered.  F/U in 1 month

## 2019-05-19 ENCOUNTER — Ambulatory Visit: Payer: No Typology Code available for payment source | Admitting: Cardiology

## 2019-06-03 ENCOUNTER — Telehealth: Payer: Self-pay | Admitting: *Deleted

## 2019-06-03 NOTE — Telephone Encounter (Signed)
Mr Holley called for a refill on his hydrocodone 10/325 #90.  Per PMP it was last filled 05/03/19 and his next appt with Zella Ball is 06/09/19.

## 2019-06-03 NOTE — Telephone Encounter (Signed)
Notified. 

## 2019-06-03 NOTE — Telephone Encounter (Signed)
Pt has a hydrocodone rx with a DNF b/f 06/01/19 date

## 2019-06-09 ENCOUNTER — Other Ambulatory Visit: Payer: Self-pay

## 2019-06-09 ENCOUNTER — Encounter
Payer: No Typology Code available for payment source | Attending: Physical Medicine & Rehabilitation | Admitting: Registered Nurse

## 2019-06-09 ENCOUNTER — Encounter: Payer: Self-pay | Admitting: Registered Nurse

## 2019-06-09 VITALS — BP 122/62 | HR 69 | Temp 97.5°F | Ht 74.0 in | Wt 217.0 lb

## 2019-06-09 DIAGNOSIS — M7522 Bicipital tendinitis, left shoulder: Secondary | ICD-10-CM | POA: Insufficient documentation

## 2019-06-09 DIAGNOSIS — Z940 Kidney transplant status: Secondary | ICD-10-CM | POA: Insufficient documentation

## 2019-06-09 DIAGNOSIS — G8929 Other chronic pain: Secondary | ICD-10-CM | POA: Diagnosis present

## 2019-06-09 DIAGNOSIS — G894 Chronic pain syndrome: Secondary | ICD-10-CM | POA: Insufficient documentation

## 2019-06-09 DIAGNOSIS — I120 Hypertensive chronic kidney disease with stage 5 chronic kidney disease or end stage renal disease: Secondary | ICD-10-CM | POA: Insufficient documentation

## 2019-06-09 DIAGNOSIS — M7581 Other shoulder lesions, right shoulder: Secondary | ICD-10-CM | POA: Insufficient documentation

## 2019-06-09 DIAGNOSIS — Z992 Dependence on renal dialysis: Secondary | ICD-10-CM | POA: Diagnosis not present

## 2019-06-09 DIAGNOSIS — M25552 Pain in left hip: Secondary | ICD-10-CM | POA: Insufficient documentation

## 2019-06-09 DIAGNOSIS — Z7952 Long term (current) use of systemic steroids: Secondary | ICD-10-CM | POA: Diagnosis not present

## 2019-06-09 DIAGNOSIS — M7061 Trochanteric bursitis, right hip: Secondary | ICD-10-CM | POA: Insufficient documentation

## 2019-06-09 DIAGNOSIS — Z79891 Long term (current) use of opiate analgesic: Secondary | ICD-10-CM | POA: Insufficient documentation

## 2019-06-09 DIAGNOSIS — M545 Low back pain, unspecified: Secondary | ICD-10-CM

## 2019-06-09 DIAGNOSIS — N186 End stage renal disease: Secondary | ICD-10-CM | POA: Insufficient documentation

## 2019-06-09 DIAGNOSIS — M25512 Pain in left shoulder: Secondary | ICD-10-CM | POA: Insufficient documentation

## 2019-06-09 DIAGNOSIS — M170 Bilateral primary osteoarthritis of knee: Secondary | ICD-10-CM | POA: Insufficient documentation

## 2019-06-09 DIAGNOSIS — M25551 Pain in right hip: Secondary | ICD-10-CM | POA: Insufficient documentation

## 2019-06-09 DIAGNOSIS — Z8249 Family history of ischemic heart disease and other diseases of the circulatory system: Secondary | ICD-10-CM | POA: Diagnosis not present

## 2019-06-09 DIAGNOSIS — Z79899 Other long term (current) drug therapy: Secondary | ICD-10-CM | POA: Insufficient documentation

## 2019-06-09 DIAGNOSIS — Z87891 Personal history of nicotine dependence: Secondary | ICD-10-CM | POA: Insufficient documentation

## 2019-06-09 DIAGNOSIS — M79673 Pain in unspecified foot: Secondary | ICD-10-CM | POA: Diagnosis not present

## 2019-06-09 DIAGNOSIS — M7582 Other shoulder lesions, left shoulder: Secondary | ICD-10-CM | POA: Diagnosis not present

## 2019-06-09 DIAGNOSIS — M25562 Pain in left knee: Secondary | ICD-10-CM | POA: Insufficient documentation

## 2019-06-09 DIAGNOSIS — M25561 Pain in right knee: Secondary | ICD-10-CM | POA: Insufficient documentation

## 2019-06-09 DIAGNOSIS — M79672 Pain in left foot: Secondary | ICD-10-CM

## 2019-06-09 DIAGNOSIS — L930 Discoid lupus erythematosus: Secondary | ICD-10-CM

## 2019-06-09 DIAGNOSIS — M2550 Pain in unspecified joint: Secondary | ICD-10-CM

## 2019-06-09 DIAGNOSIS — M7062 Trochanteric bursitis, left hip: Secondary | ICD-10-CM | POA: Insufficient documentation

## 2019-06-09 DIAGNOSIS — M79671 Pain in right foot: Secondary | ICD-10-CM | POA: Diagnosis not present

## 2019-06-09 DIAGNOSIS — Z5181 Encounter for therapeutic drug level monitoring: Secondary | ICD-10-CM | POA: Insufficient documentation

## 2019-06-09 MED ORDER — HYDROCODONE-ACETAMINOPHEN 10-325 MG PO TABS: 1.0000 | ORAL_TABLET | Freq: Three times a day (TID) | ORAL | 0 refills | Status: DC | PRN

## 2019-06-09 NOTE — Progress Notes (Signed)
Subjective:    Patient ID: [name], male    DOB: [birth year], 49 y.o.   MRN: [id number]  HPI: [name] is a 49 y.o. male who returns for follow up appointment for chronic pain and medication refill. He states his pain is located in his lower back, bilateral knee pain and bilateral feet pain. Also reports generalized joint pain. He rates his pain 6. His current exercise regime is walking and performing stretching exercises.  [name] Morphine equivalent is 30.00  MME.  Last Oral Swab was Performed on 04/05/2019, it was consistent.   Pain Inventory Average Pain 5 Pain Right Now 6 My pain is sharp and aching  In the last 24 hours, has pain interfered with the following? General activity 1 Relation with others 0 Enjoyment of life 1 What TIME of day is your pain at its worst? morning Sleep (in general) Poor  Pain is worse with: walking Pain improves with: rest and medication Relief from Meds: 5  Mobility walk without assistance how many minutes can you walk? 40 ability to climb steps?  no do you drive?  yes  Function disabled: date disabled 06/2000  Neuro/Psych No problems in this area  Prior Studies Any changes since last visit?  no  Physicians involved in your care Any changes since last visit?  no   Family History  Problem Relation Age of Onset  . Hypertension Other   . Brain cancer Mother        Died at age 25  . Aneurysm Mother   . CAD Father   . Heart attack Father 31   Social History   Socioeconomic History  . Marital status: Divorced    Spouse name: Not on file  . Number of children: 2  . Years of education: Not on file  . Highest education level: Not on file  Occupational History  . Not on file  Tobacco Use  . Smoking status: Former Smoker    Packs/day: 0.50    Years: 13.00    Pack years: 6.50    Types: Cigarettes  . Smokeless tobacco: Never Used  Substance and Sexual Activity  . Alcohol use: Yes    Comment: Occasional  .  Drug use: No  . Sexual activity: Not on file  Other Topics Concern  . Not on file  Social History Narrative  . Not on file   Social Determinants of Health   Financial Resource Strain:   . Difficulty of Paying Living Expenses:   Food Insecurity:   . Worried About Charity fundraiser in the Last Year:   . Arboriculturist in the Last Year:   Transportation Needs:   . Film/video editor (Medical):   Marland Kitchen Lack of Transportation (Non-Medical):   Physical Activity:   . Days of Exercise per Week:   . Minutes of Exercise per Session:   Stress:   . Feeling of Stress :   Social Connections:   . Frequency of Communication with Friends and Family:   . Frequency of Social Gatherings with Friends and Family:   . Attends Religious Services:   . Active Member of Clubs or Organizations:   . Attends Archivist Meetings:   Marland Kitchen Marital Status:    Past Surgical History:  Procedure Laterality Date  . NEPHRECTOMY TRANSPLANTED ORGAN     Past Medical History:  Diagnosis Date  . Anemia   . Collagen vascular disease ([place])   . Lupus ([place])   .  Renal disorder   . Renal insufficiency   . Renal transplant recipient    BP 122/62   Pulse 69   Temp (!) 97.5 F (36.4 C)   Ht 6\' 2"  (1.88 m)   Wt 217 lb (98.4 kg)   SpO2 91%   BMI 27.86 kg/m   Opioid Risk Score:   Fall Risk Score:  `1  Depression screen PHQ 2/9  Depression screen [facility] 2/9 06/09/2019 05/03/2019 06/24/2018 04/27/2018  Decreased Interest 0 0 0 0  Down, Depressed, Hopeless 0 0 0 0  PHQ - 2 Score 0 0 0 0    Review of Systems  Constitutional: Negative.   HENT: Negative.   Eyes: Negative.   Respiratory: Negative.   Cardiovascular: Negative.   Gastrointestinal: Negative.   Endocrine: Negative.   Genitourinary: Negative.   Musculoskeletal: Negative.   Skin: Negative.   Allergic/Immunologic: Negative.   Neurological: Negative.   Hematological: Negative.   Psychiatric/Behavioral: Negative.   All other systems reviewed  and are negative.      Objective:   Physical Exam Vitals and nursing note reviewed.  Constitutional:      Appearance: Normal appearance.  Cardiovascular:     Rate and Rhythm: Normal rate and regular rhythm.     Pulses: Normal pulses.     Heart sounds: Normal heart sounds.  Pulmonary:     Effort: Pulmonary effort is normal.     Breath sounds: Normal breath sounds.  Musculoskeletal:     Cervical back: Normal range of motion and neck supple.     Comments: Normal Muscle Bulk and Muscle Testing Reveals:  Upper Extremities: Full ROM and Muscle Strength 5/5  Lower Extremities: Full ROM and Muscle Strength 5/5 Arises from Table with ease  Narrow Based  Gait   Neurological:     Mental Status: He is alert and oriented to person, place, and time.  Psychiatric:        Mood and Affect: Mood normal.        Behavior: Behavior normal.           Assessment & Plan:  1. Systemic Lupus Multiple Joint Involvement/ ESRD: Continue HEP as Tolerated. Left AVF+ bruit and thrill.Marland Kitchen05/20/2021 Rheumatology and Nephrology Following.  2. Tendonitis of both rotator cuffs:No complaints today.Continue HEP as Tolerated. Continue to Monitor.06/09/2019 3. Chronic Pain Syndrome: Refilled: Hydrocodone 10/325 mg one table every 8 hours as needed for pain. #90.Continue Voltaren Gel.06/09/2019. We will continue the opioid monitoring program, this consists of regular clinic visits, examinations, urine drug screen, pill counts as well as use of [place] Controlled Substance Reporting system. 4. Polyarthralgia: Continue to Monitor.06/09/2019 5. Greater Trochanter Bursitis:No complaints today.Continue to Alternate Ice and Heat Therapy. Continue to Monitor.06/09/2019 6. Chronic Bilateral Knee Pain: Continue HEP as Tolerated and Continue to Monitor.06/09/2019 7.Bilateral feet Pain: Continue HEP as Tolerated. Continue to Monitor. 06/09/2019. 7. Chronic Bilateral Thoracic Pain:No complaints  today.Continue current medication regimen. Continue to monitor.06/09/2019  46minutes of face to face patient care time was spent during this visit. All questions were encouraged and answered.  F/U in 1 month

## 2019-06-28 ENCOUNTER — Ambulatory Visit: Payer: No Typology Code available for payment source | Admitting: Registered Nurse

## 2019-06-30 ENCOUNTER — Ambulatory Visit: Payer: No Typology Code available for payment source | Admitting: Registered Nurse

## 2019-07-19 ENCOUNTER — Encounter: Payer: Self-pay | Admitting: Registered Nurse

## 2019-07-19 ENCOUNTER — Other Ambulatory Visit: Payer: Self-pay

## 2019-07-19 ENCOUNTER — Encounter
Payer: No Typology Code available for payment source | Attending: Physical Medicine & Rehabilitation | Admitting: Registered Nurse

## 2019-07-19 VITALS — BP 101/50 | HR 70 | Temp 97.5°F | Ht 74.0 in | Wt 205.0 lb

## 2019-07-19 DIAGNOSIS — Z79891 Long term (current) use of opiate analgesic: Secondary | ICD-10-CM | POA: Diagnosis not present

## 2019-07-19 DIAGNOSIS — Z5181 Encounter for therapeutic drug level monitoring: Secondary | ICD-10-CM

## 2019-07-19 DIAGNOSIS — Z87891 Personal history of nicotine dependence: Secondary | ICD-10-CM | POA: Insufficient documentation

## 2019-07-19 DIAGNOSIS — M25512 Pain in left shoulder: Secondary | ICD-10-CM | POA: Diagnosis not present

## 2019-07-19 DIAGNOSIS — M79672 Pain in left foot: Secondary | ICD-10-CM

## 2019-07-19 DIAGNOSIS — M7522 Bicipital tendinitis, left shoulder: Secondary | ICD-10-CM | POA: Diagnosis not present

## 2019-07-19 DIAGNOSIS — M7061 Trochanteric bursitis, right hip: Secondary | ICD-10-CM | POA: Insufficient documentation

## 2019-07-19 DIAGNOSIS — M25552 Pain in left hip: Secondary | ICD-10-CM | POA: Insufficient documentation

## 2019-07-19 DIAGNOSIS — Z992 Dependence on renal dialysis: Secondary | ICD-10-CM | POA: Diagnosis not present

## 2019-07-19 DIAGNOSIS — M25562 Pain in left knee: Secondary | ICD-10-CM

## 2019-07-19 DIAGNOSIS — G894 Chronic pain syndrome: Secondary | ICD-10-CM

## 2019-07-19 DIAGNOSIS — M7062 Trochanteric bursitis, left hip: Secondary | ICD-10-CM | POA: Diagnosis not present

## 2019-07-19 DIAGNOSIS — M25561 Pain in right knee: Secondary | ICD-10-CM | POA: Diagnosis not present

## 2019-07-19 DIAGNOSIS — M545 Low back pain, unspecified: Secondary | ICD-10-CM

## 2019-07-19 DIAGNOSIS — M79671 Pain in right foot: Secondary | ICD-10-CM

## 2019-07-19 DIAGNOSIS — M170 Bilateral primary osteoarthritis of knee: Secondary | ICD-10-CM | POA: Diagnosis not present

## 2019-07-19 DIAGNOSIS — Z940 Kidney transplant status: Secondary | ICD-10-CM | POA: Diagnosis not present

## 2019-07-19 DIAGNOSIS — N186 End stage renal disease: Secondary | ICD-10-CM | POA: Diagnosis not present

## 2019-07-19 DIAGNOSIS — L930 Discoid lupus erythematosus: Secondary | ICD-10-CM

## 2019-07-19 DIAGNOSIS — M7581 Other shoulder lesions, right shoulder: Secondary | ICD-10-CM | POA: Insufficient documentation

## 2019-07-19 DIAGNOSIS — G8929 Other chronic pain: Secondary | ICD-10-CM

## 2019-07-19 DIAGNOSIS — I120 Hypertensive chronic kidney disease with stage 5 chronic kidney disease or end stage renal disease: Secondary | ICD-10-CM | POA: Diagnosis not present

## 2019-07-19 DIAGNOSIS — Z7952 Long term (current) use of systemic steroids: Secondary | ICD-10-CM | POA: Insufficient documentation

## 2019-07-19 DIAGNOSIS — M7582 Other shoulder lesions, left shoulder: Secondary | ICD-10-CM | POA: Insufficient documentation

## 2019-07-19 DIAGNOSIS — Z8249 Family history of ischemic heart disease and other diseases of the circulatory system: Secondary | ICD-10-CM | POA: Insufficient documentation

## 2019-07-19 DIAGNOSIS — M79673 Pain in unspecified foot: Secondary | ICD-10-CM | POA: Insufficient documentation

## 2019-07-19 DIAGNOSIS — M25551 Pain in right hip: Secondary | ICD-10-CM | POA: Diagnosis not present

## 2019-07-19 DIAGNOSIS — Z79899 Other long term (current) drug therapy: Secondary | ICD-10-CM | POA: Insufficient documentation

## 2019-07-19 MED ORDER — HYDROCODONE-ACETAMINOPHEN 10-325 MG PO TABS: 1.0000 | ORAL_TABLET | Freq: Three times a day (TID) | ORAL | 0 refills | Status: DC | PRN

## 2019-07-19 NOTE — Progress Notes (Signed)
Subjective:    Patient ID: [name], male    DOB: May 19, 1970, 49 y.o.   MRN: [id number]  HPI: [name] is a 49 y.o. male who returns for follow up appointment for chronic pain and medication refill. He states his pain is located in his left shoulder, lower back pain, bilateral knee pain and bilateral feet pain. He rates his pain 6. His current exercise regime is walking and performing stretching exercises.  Mr. Inglett Morphine equivalent is 30.00 MME.  Oral Swab was Performed Today.    Pain Inventory Average Pain 5 Pain Right Now 6 My pain is constant  In the last 24 hours, has pain interfered with the following? General activity 1 Relation with others 0 Enjoyment of life 0 What TIME of day is your pain at its worst? morning Sleep (in general) Poor  Pain is worse with: walking Pain improves with: rest and medication Relief from Meds: 5  Mobility walk without assistance  Function employed # of hrs/week n/a  Neuro/Psych No problems in this area  Prior Studies Any changes since last visit?  no  Physicians involved in your care Any changes since last visit?  no   Family History  Problem Relation Age of Onset  . Hypertension Other   . Brain cancer Mother        Died at age 22  . Aneurysm Mother   . CAD Father   . Heart attack Father 63   Social History   Socioeconomic History  . Marital status: Divorced    Spouse name: Not on file  . Number of children: 2  . Years of education: Not on file  . Highest education level: Not on file  Occupational History  . Not on file  Tobacco Use  . Smoking status: Former Smoker    Packs/day: 0.50    Years: 13.00    Pack years: 6.50    Types: Cigarettes  . Smokeless tobacco: Never Used  Vaping Use  . Vaping Use: Never used  Substance and Sexual Activity  . Alcohol use: Yes    Comment: Occasional  . Drug use: No  . Sexual activity: Not on file  Other Topics Concern  . Not on file  Social History  Narrative  . Not on file   Social Determinants of Health   Financial Resource Strain:   . Difficulty of Paying Living Expenses:   Food Insecurity:   . Worried About Charity fundraiser in the Last Year:   . Arboriculturist in the Last Year:   Transportation Needs:   . Film/video editor (Medical):   Marland Kitchen Lack of Transportation (Non-Medical):   Physical Activity:   . Days of Exercise per Week:   . Minutes of Exercise per Session:   Stress:   . Feeling of Stress :   Social Connections:   . Frequency of Communication with Friends and Family:   . Frequency of Social Gatherings with Friends and Family:   . Attends Religious Services:   . Active Member of Clubs or Organizations:   . Attends Archivist Meetings:   Marland Kitchen Marital Status:    Past Surgical History:  Procedure Laterality Date  . NEPHRECTOMY TRANSPLANTED ORGAN     Past Medical History:  Diagnosis Date  . Anemia   . Collagen vascular disease ([place])   . Lupus ([place])   . Renal disorder   . Renal insufficiency   . Renal transplant recipient  BP (!) 101/50   Pulse 70   Temp (!) 97.5 F (36.4 C)   Ht 6\' 2"  (1.88 m)   Wt 205 lb (93 kg)   SpO2 95%   BMI 26.32 kg/m   Opioid Risk Score:   Fall Risk Score:  `1  Depression screen PHQ 2/9  Depression screen [facility] 2/9 06/09/2019 05/03/2019 06/24/2018 04/27/2018  Decreased Interest 0 0 0 0  Down, Depressed, Hopeless 0 0 0 0  PHQ - 2 Score 0 0 0 0    Review of Systems  Constitutional: Negative.   HENT: Negative.   Eyes: Negative.   Respiratory: Negative.   Cardiovascular: Negative.   Gastrointestinal: Negative.   Endocrine: Negative.   Genitourinary: Negative.   Musculoskeletal: Positive for arthralgias, back pain and gait problem.  Skin: Negative.   Allergic/Immunologic: Negative.   Hematological: Negative.   Psychiatric/Behavioral: Negative.   All other systems reviewed and are negative.      Objective:   Physical Exam Vitals and nursing note  reviewed.  Constitutional:      Appearance: Normal appearance.  Cardiovascular:     Rate and Rhythm: Normal rate and regular rhythm.     Pulses: Normal pulses.     Heart sounds: Normal heart sounds.  Pulmonary:     Effort: Pulmonary effort is normal.     Breath sounds: Normal breath sounds.  Musculoskeletal:     Cervical back: Normal range of motion and neck supple.     Right lower leg: Edema present.     Left lower leg: Edema present.     Comments: Normal Muscle Bulk and Muscle Testing Reveals:  Upper Extremities: Full ROM and Muscle Strength 5/5  Lower Extremities: Full ROM and Muscle Strength 5/5 Arises from Table with ease Narrow Based  Gait   Skin:    General: Skin is warm and dry.  Neurological:     Mental Status: He is alert and oriented to person, place, and time.  Psychiatric:        Mood and Affect: Mood normal.        Behavior: Behavior normal.           Assessment & Plan:  1. Systemic Lupus Multiple Joint Involvement/ ESRD: Continue HEP as Tolerated. Left AVF+ bruit and thrill.Marland Kitchen06/29/2021 Rheumatology and Nephrology Following.  2. Tendonitis of both rotator cuffs:No complaints today.Continue HEP as Tolerated. Continue to Monitor.07/19/2019 3. Chronic Pain Syndrome: Refilled: Hydrocodone 10/325 mg one table every 8 hours as needed for pain. #90.Continue Voltaren Gel.07/19/2019. We will continue the opioid monitoring program, this consists of regular clinic visits, examinations, urine drug screen, pill counts as well as use of [place] Controlled Substance Reporting system. 4. Polyarthralgia: Continue to Monitor.07/19/2019 5. Greater Trochanter Bursitis:No complaints today.Continue to Alternate Ice and Heat Therapy. Continue to Monitor.07/19/2019 6. Chronic Bilateral Knee Pain: Continue HEP as Tolerated and Continue to Monitor.07/19/2019 7.Bilateral feet Pain: Continue HEP as Tolerated. Continue to Monitor.07/19/2019. 8. Chronic Bilateral  Thoracic Pain:No complaints today.Continue current medication regimen. Continue to monitor.07/19/2019 9. Chronic Left Shoulder Pain: Continue HEP as Tolerated. Continue to Monitor.   36minutes of face to face patient care time was spent during this visit. All questions were encouraged and answered.  F/U in 1 month

## 2019-07-22 LAB — DRUG TOX MONITOR 1 W/CONF, ORAL FLD
Amphetamines: NEGATIVE ng/mL (ref <10)
Barbiturates: NEGATIVE ng/mL (ref <10)
Benzodiazepines: NEGATIVE ng/mL (ref <0.50)
Buprenorphine: NEGATIVE ng/mL (ref <0.10)
Cocaine: NEGATIVE ng/mL (ref <5.0)
Codeine: NEGATIVE ng/mL (ref <2.5)
Cotinine: 9.6 ng/mL — ABNORMAL HIGH (ref <5.0)
Dihydrocodeine: NEGATIVE ng/mL (ref <2.5)
Fentanyl: NEGATIVE ng/mL (ref <0.10)
Heroin Metabolite: NEGATIVE ng/mL (ref <1.0)
Hydrocodone: 38.7 ng/mL — ABNORMAL HIGH (ref <2.5)
Hydromorphone: NEGATIVE ng/mL (ref <2.5)
MARIJUANA: NEGATIVE ng/mL (ref <2.5)
MDMA: NEGATIVE ng/mL (ref <10)
Meprobamate: NEGATIVE ng/mL (ref <2.5)
Methadone: NEGATIVE ng/mL (ref <5.0)
Morphine: NEGATIVE ng/mL (ref <2.5)
Nicotine Metabolite: POSITIVE ng/mL — AB (ref <5.0)
Norhydrocodone: 5 ng/mL — ABNORMAL HIGH (ref <2.5)
Noroxycodone: NEGATIVE ng/mL (ref <2.5)
Opiates: POSITIVE ng/mL — AB (ref <2.5)
Oxycodone: NEGATIVE ng/mL (ref <2.5)
Oxymorphone: NEGATIVE ng/mL (ref <2.5)
Phencyclidine: NEGATIVE ng/mL (ref <10)
Tapentadol: NEGATIVE ng/mL (ref <5.0)
Tramadol: NEGATIVE ng/mL (ref <5.0)
Zolpidem: NEGATIVE ng/mL (ref <5.0)

## 2019-07-22 LAB — DRUG TOX ALC METAB W/CON, ORAL FLD: Alcohol Metabolite: NEGATIVE ng/mL (ref <25)

## 2019-07-29 ENCOUNTER — Telehealth: Payer: Self-pay | Admitting: *Deleted

## 2019-07-29 NOTE — Telephone Encounter (Signed)
Oral swab drug screen was consistent for prescribed medications.  ?

## 2019-08-16 ENCOUNTER — Other Ambulatory Visit: Payer: Self-pay

## 2019-08-16 ENCOUNTER — Encounter: Payer: Self-pay | Admitting: Registered Nurse

## 2019-08-16 ENCOUNTER — Encounter
Payer: No Typology Code available for payment source | Attending: Physical Medicine & Rehabilitation | Admitting: Registered Nurse

## 2019-08-16 VITALS — BP 98/52 | HR 71 | Temp 98.7°F | Ht 74.0 in | Wt 211.0 lb

## 2019-08-16 DIAGNOSIS — Z79899 Other long term (current) drug therapy: Secondary | ICD-10-CM | POA: Insufficient documentation

## 2019-08-16 DIAGNOSIS — G894 Chronic pain syndrome: Secondary | ICD-10-CM | POA: Diagnosis present

## 2019-08-16 DIAGNOSIS — M79672 Pain in left foot: Secondary | ICD-10-CM

## 2019-08-16 DIAGNOSIS — N186 End stage renal disease: Secondary | ICD-10-CM | POA: Insufficient documentation

## 2019-08-16 DIAGNOSIS — M79673 Pain in unspecified foot: Secondary | ICD-10-CM | POA: Diagnosis not present

## 2019-08-16 DIAGNOSIS — I120 Hypertensive chronic kidney disease with stage 5 chronic kidney disease or end stage renal disease: Secondary | ICD-10-CM | POA: Diagnosis not present

## 2019-08-16 DIAGNOSIS — Z8249 Family history of ischemic heart disease and other diseases of the circulatory system: Secondary | ICD-10-CM | POA: Insufficient documentation

## 2019-08-16 DIAGNOSIS — Z79891 Long term (current) use of opiate analgesic: Secondary | ICD-10-CM | POA: Diagnosis present

## 2019-08-16 DIAGNOSIS — M25562 Pain in left knee: Secondary | ICD-10-CM

## 2019-08-16 DIAGNOSIS — M7061 Trochanteric bursitis, right hip: Secondary | ICD-10-CM | POA: Insufficient documentation

## 2019-08-16 DIAGNOSIS — G8929 Other chronic pain: Secondary | ICD-10-CM

## 2019-08-16 DIAGNOSIS — M545 Low back pain, unspecified: Secondary | ICD-10-CM

## 2019-08-16 DIAGNOSIS — M25561 Pain in right knee: Secondary | ICD-10-CM

## 2019-08-16 DIAGNOSIS — M7582 Other shoulder lesions, left shoulder: Secondary | ICD-10-CM | POA: Insufficient documentation

## 2019-08-16 DIAGNOSIS — M79671 Pain in right foot: Secondary | ICD-10-CM | POA: Diagnosis not present

## 2019-08-16 DIAGNOSIS — M25512 Pain in left shoulder: Secondary | ICD-10-CM | POA: Insufficient documentation

## 2019-08-16 DIAGNOSIS — L930 Discoid lupus erythematosus: Secondary | ICD-10-CM | POA: Diagnosis not present

## 2019-08-16 DIAGNOSIS — M25552 Pain in left hip: Secondary | ICD-10-CM | POA: Diagnosis not present

## 2019-08-16 DIAGNOSIS — M25551 Pain in right hip: Secondary | ICD-10-CM | POA: Diagnosis not present

## 2019-08-16 DIAGNOSIS — Z940 Kidney transplant status: Secondary | ICD-10-CM | POA: Diagnosis not present

## 2019-08-16 DIAGNOSIS — Z7952 Long term (current) use of systemic steroids: Secondary | ICD-10-CM | POA: Diagnosis not present

## 2019-08-16 DIAGNOSIS — M7062 Trochanteric bursitis, left hip: Secondary | ICD-10-CM | POA: Insufficient documentation

## 2019-08-16 DIAGNOSIS — Z992 Dependence on renal dialysis: Secondary | ICD-10-CM | POA: Diagnosis not present

## 2019-08-16 DIAGNOSIS — Z5181 Encounter for therapeutic drug level monitoring: Secondary | ICD-10-CM | POA: Diagnosis present

## 2019-08-16 DIAGNOSIS — W010XXD Fall on same level from slipping, tripping and stumbling without subsequent striking against object, subsequent encounter: Secondary | ICD-10-CM

## 2019-08-16 DIAGNOSIS — M7522 Bicipital tendinitis, left shoulder: Secondary | ICD-10-CM | POA: Insufficient documentation

## 2019-08-16 DIAGNOSIS — M170 Bilateral primary osteoarthritis of knee: Secondary | ICD-10-CM | POA: Diagnosis not present

## 2019-08-16 DIAGNOSIS — M2550 Pain in unspecified joint: Secondary | ICD-10-CM

## 2019-08-16 DIAGNOSIS — M7581 Other shoulder lesions, right shoulder: Secondary | ICD-10-CM | POA: Insufficient documentation

## 2019-08-16 DIAGNOSIS — Z87891 Personal history of nicotine dependence: Secondary | ICD-10-CM | POA: Insufficient documentation

## 2019-08-16 MED ORDER — HYDROCODONE-ACETAMINOPHEN 10-325 MG PO TABS: 1.0000 | ORAL_TABLET | Freq: Three times a day (TID) | ORAL | 0 refills | Status: DC | PRN

## 2019-08-16 NOTE — Progress Notes (Signed)
Subjective:    Patient ID: [name], male    DOB: 1970-04-08, 49 y.o.   MRN: [id number]  HPI: [name] is a 49 y.o. male who returns for follow up appointment for chronic pain and medication refill. He states his pain is located in his lower back pain, bilateral hips, bilateral knees and bilateral feet pain and generalized joint pain. He rates his pain 7. His  current exercise regime is walking and performing stretching exercises.  [name] reports he was at the [facility] on last Monday going for a Transplant evaluation in [place], when he lost his footing and fell on his right hip. Right Hip with hematoma noted with resolving ecchymosis, he was instructed to continue to monitor and to F/U with his PCP, he verbalizes understanding. Educated on Falls prevention, he verbalizes understanding.   [name] states he left the remainder of his hydrocodone in his suitcase, he was instructed to call office with his hydrocodone pill count. He verbalizes understanding.   [name] Morphine equivalent is 30.00 MME.    Last Oral Swab was Performed on 07/19/2019, it was consistent.    Pain Inventory Average Pain 5 Pain Right Now 7 My pain is sharp, stabbing and aching  In the last 24 hours, has pain interfered with the following? General activity 2 Relation with others 0 Enjoyment of life 2 What TIME of day is your pain at its worst? morning Sleep (in general) Good  Pain is worse with: walking and standing Pain improves with: rest, heat/ice and medication Relief from Meds: 5  Mobility walk without assistance  Function disabled: date disabled .  Neuro/Psych trouble walking  Prior Studies Any changes since last visit?  no  Physicians involved in your care    Family History  Problem Relation Age of Onset  . Hypertension Other   . Brain cancer Mother        Died at age 36  . Aneurysm Mother   . CAD Father   . Heart attack Father 50   Social History    Socioeconomic History  . Marital status: Divorced    Spouse name: Not on file  . Number of children: 2  . Years of education: Not on file  . Highest education level: Not on file  Occupational History  . Not on file  Tobacco Use  . Smoking status: Former Smoker    Packs/day: 0.50    Years: 13.00    Pack years: 6.50    Types: Cigarettes  . Smokeless tobacco: Never Used  Vaping Use  . Vaping Use: Never used  Substance and Sexual Activity  . Alcohol use: Yes    Comment: Occasional  . Drug use: No  . Sexual activity: Not on file  Other Topics Concern  . Not on file  Social History Narrative  . Not on file   Social Determinants of Health   Financial Resource Strain:   . Difficulty of Paying Living Expenses:   Food Insecurity:   . Worried About Charity fundraiser in the Last Year:   . Arboriculturist in the Last Year:   Transportation Needs:   . Film/video editor (Medical):   Marland Kitchen Lack of Transportation (Non-Medical):   Physical Activity:   . Days of Exercise per Week:   . Minutes of Exercise per Session:   Stress:   . Feeling of Stress :   Social Connections:   . Frequency of Communication with Friends  and Family:   . Frequency of Social Gatherings with Friends and Family:   . Attends Religious Services:   . Active Member of Clubs or Organizations:   . Attends Archivist Meetings:   Marland Kitchen Marital Status:    Past Surgical History:  Procedure Laterality Date  . NEPHRECTOMY TRANSPLANTED ORGAN     Past Medical History:  Diagnosis Date  . Anemia   . Collagen vascular disease ([place])   . Lupus ([place])   . Renal disorder   . Renal insufficiency   . Renal transplant recipient    BP (!) 119/58   Pulse 71   Temp 98.7 F (37.1 C)   Ht 6\' 2"  (1.88 m)   Wt (!) 211 lb (95.7 kg)   SpO2 90%   BMI 27.09 kg/m   Opioid Risk Score:   Fall Risk Score:  `1  Depression screen PHQ 2/9  Depression screen [facility] 2/9 06/09/2019 05/03/2019 06/24/2018 04/27/2018  Decreased  Interest 0 0 0 0  Down, Depressed, Hopeless 0 0 0 0  PHQ - 2 Score 0 0 0 0    Review of Systems  Constitutional: Negative.   HENT: Negative.   Eyes: Negative.   Respiratory: Negative.   Cardiovascular: Negative.   Gastrointestinal: Negative.   Endocrine: Negative.   Genitourinary: Negative.   Musculoskeletal: Positive for arthralgias and gait problem.  Skin: Negative.   Allergic/Immunologic: Negative.   Hematological: Negative.   Psychiatric/Behavioral: Negative.   All other systems reviewed and are negative.      Objective:   Physical Exam Vitals and nursing note reviewed.  Constitutional:      Appearance: Normal appearance.  Cardiovascular:     Rate and Rhythm: Normal rate and regular rhythm.     Pulses: Normal pulses.     Heart sounds: Normal heart sounds.  Pulmonary:     Effort: Pulmonary effort is normal.     Breath sounds: Normal breath sounds.  Musculoskeletal:     Cervical back: Normal range of motion and neck supple.     Comments: Normal Muscle Bulk and Muscle Testing Reveals:  Upper Extremities: Full ROM and Muscle Strength 5/5  Lower Extremities: Full ROM and Muscle Strength 5/5 Arises from Table Slowly Antalgic Gait   Skin:    General: Skin is warm and dry.  Neurological:     Mental Status: He is alert and oriented to person, place, and time.  Psychiatric:        Mood and Affect: Mood normal.        Behavior: Behavior normal.           Assessment & Plan:  1. Systemic Lupus Multiple Joint Involvement/ ESRD: Continue HEP as Tolerated. Left AVF+ bruit and thrill.Marland Kitchen07/27/2021 Rheumatology and Nephrology Following.  2. Tendonitis of both rotator cuffs:No complaints today.Continue HEP as Tolerated. Continue to Monitor.08/16/2019 3. Chronic Pain Syndrome: Refilled: Hydrocodone 10/325 mg one table every 8 hours as needed for pain. #90.Continue Voltaren Gel.08/16/2019. We will continue the opioid monitoring program, this consists of regular  clinic visits, examinations, urine drug screen, pill counts as well as use of [place] Controlled Substance Reporting system. 4. Polyarthralgia: Continue to Monitor.08/16/2019 5. Greater Trochanter Bursitis:No complaints today.Continue to Alternate Ice and Heat Therapy. Continue to Monitor.08/16/2019 6. Chronic Bilateral Knee Pain: Continue HEP as Tolerated and Continue to Monitor.08/16/2019 7.Bilateral feet Pain: Continue HEP as Tolerated. Continue to Monitor.08/16/2019. 8. Chronic Bilateral Thoracic Pain:No complaints today.Continue current medication regimen. Continue to monitor.08/16/2019 9. Chronic Left Shoulder Pain:  No Complaints today. Continue HEP as Tolerated. Continue to Monitor. 08/16/2019.  33minutes of face to face patient care time was spent during this visit. All questions were encouraged and answered.  F/U in 1 month

## 2019-09-20 ENCOUNTER — Other Ambulatory Visit: Payer: Self-pay

## 2019-09-20 ENCOUNTER — Encounter
Payer: No Typology Code available for payment source | Attending: Physical Medicine & Rehabilitation | Admitting: Registered Nurse

## 2019-09-20 VITALS — BP 107/56 | HR 69 | Temp 98.0°F | Ht 74.0 in | Wt 205.6 lb

## 2019-09-20 DIAGNOSIS — M25512 Pain in left shoulder: Secondary | ICD-10-CM | POA: Diagnosis not present

## 2019-09-20 DIAGNOSIS — N186 End stage renal disease: Secondary | ICD-10-CM | POA: Diagnosis not present

## 2019-09-20 DIAGNOSIS — Z992 Dependence on renal dialysis: Secondary | ICD-10-CM | POA: Insufficient documentation

## 2019-09-20 DIAGNOSIS — Z940 Kidney transplant status: Secondary | ICD-10-CM | POA: Insufficient documentation

## 2019-09-20 DIAGNOSIS — G894 Chronic pain syndrome: Secondary | ICD-10-CM | POA: Diagnosis not present

## 2019-09-20 DIAGNOSIS — M79673 Pain in unspecified foot: Secondary | ICD-10-CM | POA: Insufficient documentation

## 2019-09-20 DIAGNOSIS — M7581 Other shoulder lesions, right shoulder: Secondary | ICD-10-CM | POA: Diagnosis not present

## 2019-09-20 DIAGNOSIS — M170 Bilateral primary osteoarthritis of knee: Secondary | ICD-10-CM | POA: Diagnosis not present

## 2019-09-20 DIAGNOSIS — M7062 Trochanteric bursitis, left hip: Secondary | ICD-10-CM | POA: Insufficient documentation

## 2019-09-20 DIAGNOSIS — M2550 Pain in unspecified joint: Secondary | ICD-10-CM

## 2019-09-20 DIAGNOSIS — M7582 Other shoulder lesions, left shoulder: Secondary | ICD-10-CM | POA: Insufficient documentation

## 2019-09-20 DIAGNOSIS — M7061 Trochanteric bursitis, right hip: Secondary | ICD-10-CM | POA: Diagnosis not present

## 2019-09-20 DIAGNOSIS — M25551 Pain in right hip: Secondary | ICD-10-CM | POA: Insufficient documentation

## 2019-09-20 DIAGNOSIS — M545 Low back pain, unspecified: Secondary | ICD-10-CM

## 2019-09-20 DIAGNOSIS — Z5181 Encounter for therapeutic drug level monitoring: Secondary | ICD-10-CM | POA: Diagnosis present

## 2019-09-20 DIAGNOSIS — M79671 Pain in right foot: Secondary | ICD-10-CM | POA: Diagnosis not present

## 2019-09-20 DIAGNOSIS — Z87891 Personal history of nicotine dependence: Secondary | ICD-10-CM | POA: Insufficient documentation

## 2019-09-20 DIAGNOSIS — M25562 Pain in left knee: Secondary | ICD-10-CM

## 2019-09-20 DIAGNOSIS — M25561 Pain in right knee: Secondary | ICD-10-CM

## 2019-09-20 DIAGNOSIS — G8929 Other chronic pain: Secondary | ICD-10-CM

## 2019-09-20 DIAGNOSIS — Z79891 Long term (current) use of opiate analgesic: Secondary | ICD-10-CM | POA: Diagnosis present

## 2019-09-20 DIAGNOSIS — L930 Discoid lupus erythematosus: Secondary | ICD-10-CM

## 2019-09-20 DIAGNOSIS — Z7952 Long term (current) use of systemic steroids: Secondary | ICD-10-CM | POA: Insufficient documentation

## 2019-09-20 DIAGNOSIS — Z79899 Other long term (current) drug therapy: Secondary | ICD-10-CM | POA: Insufficient documentation

## 2019-09-20 DIAGNOSIS — Z8249 Family history of ischemic heart disease and other diseases of the circulatory system: Secondary | ICD-10-CM | POA: Diagnosis not present

## 2019-09-20 DIAGNOSIS — M25552 Pain in left hip: Secondary | ICD-10-CM | POA: Diagnosis not present

## 2019-09-20 DIAGNOSIS — M79672 Pain in left foot: Secondary | ICD-10-CM

## 2019-09-20 DIAGNOSIS — I120 Hypertensive chronic kidney disease with stage 5 chronic kidney disease or end stage renal disease: Secondary | ICD-10-CM | POA: Insufficient documentation

## 2019-09-20 DIAGNOSIS — M7522 Bicipital tendinitis, left shoulder: Secondary | ICD-10-CM | POA: Diagnosis not present

## 2019-09-20 MED ORDER — HYDROCODONE-ACETAMINOPHEN 10-325 MG PO TABS: 1.0000 | ORAL_TABLET | Freq: Three times a day (TID) | ORAL | 0 refills | Status: DC | PRN

## 2019-09-20 NOTE — Progress Notes (Signed)
Subjective:    Patient ID: [name], male    DOB: 1970/12/23, 49 y.o.   MRN: [id number]  HPI: [name] is a 49 y.o. male who returns for follow up appointment for chronic pain and medication refill. He states his pain is located in his bilateral hips and bilateral feet. He rates his pain 5. His current exercise regime is walking.  [name] reports he slept 14 hours and thinks it's related to his CPAP machine, he called the company. He was instructed to F/U with his Pulmonologist and his PCP, he verbalizes understanding.   [name] Morphine equivalent is 30.00 MME.    Last Oral Swab was Performed on 07/19/2019, it was consistent.   Pain Inventory Average Pain 5 Pain Right Now 5 My pain is stabbing and aching  In the last 24 hours, has pain interfered with the following? General activity 3 Relation with others 1 Enjoyment of life 3 What TIME of day is your pain at its worst? morning  Sleep (in general) Fair  Pain is worse with: walking, bending and standing Pain improves with: rest and medication Relief from Meds: 5  Family History  Problem Relation Age of Onset  . Hypertension Other   . Brain cancer Mother        Died at age 13  . Aneurysm Mother   . CAD Father   . Heart attack Father 87   Social History   Socioeconomic History  . Marital status: Divorced    Spouse name: Not on file  . Number of children: 2  . Years of education: Not on file  . Highest education level: Not on file  Occupational History  . Not on file  Tobacco Use  . Smoking status: Former Smoker    Packs/day: 0.50    Years: 13.00    Pack years: 6.50    Types: Cigarettes  . Smokeless tobacco: Never Used  Vaping Use  . Vaping Use: Never used  Substance and Sexual Activity  . Alcohol use: Yes    Comment: Occasional  . Drug use: No  . Sexual activity: Not on file  Other Topics Concern  . Not on file  Social History Narrative  . Not on file   Social Determinants of Health    Financial Resource Strain:   . Difficulty of Paying Living Expenses: Not on file  Food Insecurity:   . Worried About Charity fundraiser in the Last Year: Not on file  . Ran Out of Food in the Last Year: Not on file  Transportation Needs:   . Lack of Transportation (Medical): Not on file  . Lack of Transportation (Non-Medical): Not on file  Physical Activity:   . Days of Exercise per Week: Not on file  . Minutes of Exercise per Session: Not on file  Stress:   . Feeling of Stress : Not on file  Social Connections:   . Frequency of Communication with Friends and Family: Not on file  . Frequency of Social Gatherings with Friends and Family: Not on file  . Attends Religious Services: Not on file  . Active Member of Clubs or Organizations: Not on file  . Attends Archivist Meetings: Not on file  . Marital Status: Not on file   Past Surgical History:  Procedure Laterality Date  . NEPHRECTOMY TRANSPLANTED ORGAN     Past Surgical History:  Procedure Laterality Date  . NEPHRECTOMY TRANSPLANTED ORGAN     Past Medical  History:  Diagnosis Date  . Anemia   . Collagen vascular disease ([place])   . Lupus ([place])   . Renal disorder   . Renal insufficiency   . Renal transplant recipient    BP (!) 107/56   Pulse 69   Temp 98 F (36.7 C)   Ht 6\' 2"  (1.88 m)   Wt 205 lb 9.6 oz (93.3 kg)   SpO2 93%   BMI 26.40 kg/m   Opioid Risk Score:   Fall Risk Score:  `1  Depression screen PHQ 2/9  Depression screen [facility] 2/9 06/09/2019 05/03/2019 06/24/2018 04/27/2018  Decreased Interest 0 0 0 0  Down, Depressed, Hopeless 0 0 0 0  PHQ - 2 Score 0 0 0 0    Review of Systems  Musculoskeletal: Positive for joint swelling.       Hip and feet pain   All other systems reviewed and are negative.      Objective:   Physical Exam Vitals and nursing note reviewed.  Constitutional:      Appearance: Normal appearance.  Cardiovascular:     Rate and Rhythm: Normal rate and regular rhythm.      Pulses: Normal pulses.     Heart sounds: Normal heart sounds.  Pulmonary:     Effort: Pulmonary effort is normal.     Breath sounds: Normal breath sounds.  Musculoskeletal:     Cervical back: Normal range of motion and neck supple.     Right lower leg: Edema present.     Left lower leg: Edema present.     Comments: Normal Muscle Bulk and Muscle Testing Reveals:  Upper Extremities: Full ROM and Muscle Strength 5/5  Lower Extremities: Full ROM and Muscle Strength 5/5 Arises from Table Slowly Narrow Based  Gait   Skin:    General: Skin is warm and dry.  Neurological:     Mental Status: He is alert and oriented to person, place, and time.  Psychiatric:        Mood and Affect: Mood normal.        Behavior: Behavior normal.           Assessment & Plan:  1. Systemic Lupus Multiple Joint Involvement/ ESRD: Continue HEP as Tolerated. Left AVF+ bruit and thrill.Marland Kitchen08/31/2021 Rheumatology and Nephrology Following.  2. Tendonitis of both rotator cuffs:No complaints today.Continue HEP as Tolerated. Continue to Monitor.09/20/2019 3. Chronic Pain Syndrome: Refilled: Hydrocodone 10/325 mg one table every 8 hours as needed for pain. #90.Continue Voltaren Gel.09/20/2019. We will continue the opioid monitoring program, this consists of regular clinic visits, examinations, urine drug screen, pill counts as well as use of [place] Controlled Substance Reporting system. A 12 month History has been reviewed on the [place] Controlled Substance Reporting System on 09/20/2019. 4. Polyarthralgia: Continue to Monitor.09/20/2019 5. Greater Trochanter Bursitis:Continue to Alternate Ice and Heat Therapy. Continue to Monitor.09/20/2019 6. Chronic Bilateral Knee Pain: No complaints today. Continue HEP as Tolerated and Continue to Monitor.09/20/2019 7.Bilateral feet Pain: Continue HEP as Tolerated. Continue to Monitor.09/20/2019. 8. Chronic Bilateral Thoracic Pain:No complaints  today.Continue current medication regimen. Continue to monitor.09/20/2019 9. Chronic Left Shoulder Pain: No Complaints today. Continue HEP as Tolerated. Continue to Monitor.09/20/2019.  67minutes of face to face patient care time was spent during this visit. All questions were encouraged and answered.  F/U in 1 month

## 2019-09-21 ENCOUNTER — Encounter: Payer: Self-pay | Admitting: Registered Nurse

## 2019-10-18 ENCOUNTER — Encounter
Payer: No Typology Code available for payment source | Attending: Physical Medicine & Rehabilitation | Admitting: Registered Nurse

## 2019-10-18 ENCOUNTER — Ambulatory Visit: Payer: No Typology Code available for payment source | Admitting: Registered Nurse

## 2019-10-18 ENCOUNTER — Other Ambulatory Visit: Payer: Self-pay

## 2019-10-18 ENCOUNTER — Encounter: Payer: Self-pay | Admitting: Registered Nurse

## 2019-10-18 VITALS — BP 103/57 | HR 66 | Temp 97.7°F | Ht 74.0 in | Wt 206.0 lb

## 2019-10-18 DIAGNOSIS — Z79891 Long term (current) use of opiate analgesic: Secondary | ICD-10-CM | POA: Diagnosis present

## 2019-10-18 DIAGNOSIS — M7522 Bicipital tendinitis, left shoulder: Secondary | ICD-10-CM | POA: Diagnosis not present

## 2019-10-18 DIAGNOSIS — I120 Hypertensive chronic kidney disease with stage 5 chronic kidney disease or end stage renal disease: Secondary | ICD-10-CM | POA: Diagnosis not present

## 2019-10-18 DIAGNOSIS — G894 Chronic pain syndrome: Secondary | ICD-10-CM | POA: Diagnosis present

## 2019-10-18 DIAGNOSIS — Z7952 Long term (current) use of systemic steroids: Secondary | ICD-10-CM | POA: Insufficient documentation

## 2019-10-18 DIAGNOSIS — M545 Low back pain: Secondary | ICD-10-CM | POA: Insufficient documentation

## 2019-10-18 DIAGNOSIS — Z5181 Encounter for therapeutic drug level monitoring: Secondary | ICD-10-CM | POA: Insufficient documentation

## 2019-10-18 DIAGNOSIS — Z992 Dependence on renal dialysis: Secondary | ICD-10-CM | POA: Insufficient documentation

## 2019-10-18 DIAGNOSIS — M79673 Pain in unspecified foot: Secondary | ICD-10-CM | POA: Insufficient documentation

## 2019-10-18 DIAGNOSIS — M7062 Trochanteric bursitis, left hip: Secondary | ICD-10-CM | POA: Insufficient documentation

## 2019-10-18 DIAGNOSIS — M79671 Pain in right foot: Secondary | ICD-10-CM | POA: Diagnosis not present

## 2019-10-18 DIAGNOSIS — Z940 Kidney transplant status: Secondary | ICD-10-CM | POA: Insufficient documentation

## 2019-10-18 DIAGNOSIS — Z79899 Other long term (current) drug therapy: Secondary | ICD-10-CM | POA: Insufficient documentation

## 2019-10-18 DIAGNOSIS — M25561 Pain in right knee: Secondary | ICD-10-CM | POA: Diagnosis not present

## 2019-10-18 DIAGNOSIS — M7582 Other shoulder lesions, left shoulder: Secondary | ICD-10-CM | POA: Diagnosis not present

## 2019-10-18 DIAGNOSIS — M7581 Other shoulder lesions, right shoulder: Secondary | ICD-10-CM | POA: Diagnosis not present

## 2019-10-18 DIAGNOSIS — N186 End stage renal disease: Secondary | ICD-10-CM | POA: Insufficient documentation

## 2019-10-18 DIAGNOSIS — M25562 Pain in left knee: Secondary | ICD-10-CM

## 2019-10-18 DIAGNOSIS — M79672 Pain in left foot: Secondary | ICD-10-CM

## 2019-10-18 DIAGNOSIS — M25551 Pain in right hip: Secondary | ICD-10-CM | POA: Insufficient documentation

## 2019-10-18 DIAGNOSIS — M170 Bilateral primary osteoarthritis of knee: Secondary | ICD-10-CM | POA: Diagnosis not present

## 2019-10-18 DIAGNOSIS — M7061 Trochanteric bursitis, right hip: Secondary | ICD-10-CM | POA: Insufficient documentation

## 2019-10-18 DIAGNOSIS — G8929 Other chronic pain: Secondary | ICD-10-CM | POA: Diagnosis present

## 2019-10-18 DIAGNOSIS — Z8249 Family history of ischemic heart disease and other diseases of the circulatory system: Secondary | ICD-10-CM | POA: Diagnosis not present

## 2019-10-18 DIAGNOSIS — L930 Discoid lupus erythematosus: Secondary | ICD-10-CM | POA: Diagnosis not present

## 2019-10-18 DIAGNOSIS — M2550 Pain in unspecified joint: Secondary | ICD-10-CM

## 2019-10-18 DIAGNOSIS — M25552 Pain in left hip: Secondary | ICD-10-CM | POA: Diagnosis not present

## 2019-10-18 DIAGNOSIS — M25512 Pain in left shoulder: Secondary | ICD-10-CM | POA: Diagnosis not present

## 2019-10-18 DIAGNOSIS — Z87891 Personal history of nicotine dependence: Secondary | ICD-10-CM | POA: Insufficient documentation

## 2019-10-18 MED ORDER — HYDROCODONE-ACETAMINOPHEN 10-325 MG PO TABS: 1.0000 | ORAL_TABLET | Freq: Three times a day (TID) | ORAL | 0 refills | Status: DC | PRN

## 2019-10-18 NOTE — Progress Notes (Signed)
Subjective:    Patient ID: [name], male    DOB: 05-21-1970, 49 y.o.   MRN: [id number]  HPI: [name] is a 49 y.o. male who returns for follow up appointment for chronic pain and medication refill. He states his pain is located in his bilateral hips, bilateral knees and bilateral feet. Also reports generalized joint pain. He rates his pain 6. His current exercise regime is walking and performing stretching exercises every other day he states.  Mr. Stuck Morphine equivalent is 30.00  MME.  Last Oral Swab was Performed on 07/19/2019,    Pain Inventory Average Pain 5 Pain Right Now 6 My pain is sharp, stabbing and aching  In the last 24 hours, has pain interfered with the following? General activity 1 Relation with others 1 Enjoyment of life 1 What TIME of day is your pain at its worst? morning  Sleep (in general) Good  Pain is worse with: walking and standing Pain improves with: rest and medication Relief from Meds:   Family History  Problem Relation Age of Onset  . Hypertension Other   . Brain cancer Mother        Died at age 14  . Aneurysm Mother   . CAD Father   . Heart attack Father 36   Social History   Socioeconomic History  . Marital status: Divorced    Spouse name: Not on file  . Number of children: 2  . Years of education: Not on file  . Highest education level: Not on file  Occupational History  . Not on file  Tobacco Use  . Smoking status: Former Smoker    Packs/day: 0.50    Years: 13.00    Pack years: 6.50    Types: Cigarettes  . Smokeless tobacco: Never Used  Vaping Use  . Vaping Use: Never used  Substance and Sexual Activity  . Alcohol use: Yes    Comment: Occasional  . Drug use: No  . Sexual activity: Not on file  Other Topics Concern  . Not on file  Social History Narrative  . Not on file   Social Determinants of Health   Financial Resource Strain:   . Difficulty of Paying Living Expenses: Not on file  Food Insecurity:    . Worried About Charity fundraiser in the Last Year: Not on file  . Ran Out of Food in the Last Year: Not on file  Transportation Needs:   . Lack of Transportation (Medical): Not on file  . Lack of Transportation (Non-Medical): Not on file  Physical Activity:   . Days of Exercise per Week: Not on file  . Minutes of Exercise per Session: Not on file  Stress:   . Feeling of Stress : Not on file  Social Connections:   . Frequency of Communication with Friends and Family: Not on file  . Frequency of Social Gatherings with Friends and Family: Not on file  . Attends Religious Services: Not on file  . Active Member of Clubs or Organizations: Not on file  . Attends Archivist Meetings: Not on file  . Marital Status: Not on file   Past Surgical History:  Procedure Laterality Date  . NEPHRECTOMY TRANSPLANTED ORGAN     Past Surgical History:  Procedure Laterality Date  . NEPHRECTOMY TRANSPLANTED ORGAN     Past Medical History:  Diagnosis Date  . Anemia   . Collagen vascular disease ([place])   . Lupus ([place])   .  Renal disorder   . Renal insufficiency   . Renal transplant recipient    BP (!) 103/57   Pulse 66   Temp 97.7 F (36.5 C)   Ht 6\' 2"  (1.88 m)   Wt 206 lb (93.4 kg)   SpO2 91%   BMI 26.45 kg/m   Opioid Risk Score:   Fall Risk Score:  `1  Depression screen PHQ 2/9  Depression screen [facility] 2/9 06/09/2019 05/03/2019 06/24/2018 04/27/2018  Decreased Interest 0 0 0 0  Down, Depressed, Hopeless 0 0 0 0  PHQ - 2 Score 0 0 0 0     Review of Systems  Constitutional: Negative.   HENT: Negative.   Eyes: Negative.   Respiratory: Negative.   Cardiovascular: Negative.   Gastrointestinal: Negative.   Endocrine: Negative.   Genitourinary: Negative.   Musculoskeletal: Positive for arthralgias.  Skin: Negative.   Allergic/Immunologic: Negative.   Neurological: Positive for weakness.  Hematological: Negative.   Psychiatric/Behavioral: Negative.   All other systems  reviewed and are negative.      Objective:   Physical Exam Vitals and nursing note reviewed.  Constitutional:      Appearance: Normal appearance.  Cardiovascular:     Rate and Rhythm: Normal rate and regular rhythm.     Pulses: Normal pulses.     Heart sounds: Normal heart sounds.  Pulmonary:     Effort: Pulmonary effort is normal.     Breath sounds: Normal breath sounds.  Musculoskeletal:     Cervical back: Normal range of motion and neck supple.     Comments: Normal Muscle Bulk and Muscle Testing Reveals:  Upper Extremities: Full ROM and Muscle Strength 5/5  Lower Extremities: Full ROM and Muscle Strength 5/5 Arises from Table Slowly  Antalgic  Gait   Skin:    General: Skin is warm and dry.  Neurological:     Mental Status: He is alert and oriented to person, place, and time.  Psychiatric:        Mood and Affect: Mood normal.        Behavior: Behavior normal.           Assessment & Plan:  1. Systemic Lupus Multiple Joint Involvement/ ESRD: Continue HEP as Tolerated. Left AVF+ bruit and thrill. 10/18/2019 Rheumatology and Nephrology Following.  2. Tendonitis of both rotator cuffs: No complaints today.Continue HEP as Tolerated. Continue to Monitor.10/18/2019 3. Chronic Pain Syndrome: Refilled: Hydrocodone 10/325 mg one table every 8 hours as needed for pain. #90.Continue Voltaren Gel.10/18/2019. We will continue the opioid monitoring program, this consists of regular clinic visits, examinations, urine drug screen, pill counts as well as use of [place] Controlled Substance Reporting system. A 12 month History has been reviewed on the [place] Controlled Substance Reporting System on 10/18/2019. 4. Polyarthralgia: Continue to Monitor.10/18/2019 5. Greater Trochanter Bursitis:Continue to Alternate Ice and Heat Therapy. Continue to Monitor.10/18/2019 6. Chronic Bilateral Knee Pain: Continue HEP as Tolerated and Continue to  Monitor.10/18/2019 7.Bilateral feet Pain: Continue HEP as Tolerated. Continue to Monitor.10/18/2019. 8. Chronic Bilateral Thoracic Pain:No complaints today.Continue current medication regimen. Continue to monitor.10/18/2019 9. Chronic Left Shoulder Pain:No Complaints today.Continue HEP as Tolerated. Continue to Monitor.10/18/2019.  83minutes of face to face patient care time was spent during this visit. All questions were encouraged and answered.  F/U in 1 month

## 2019-11-10 ENCOUNTER — Encounter: Payer: Self-pay | Admitting: Emergency Medicine

## 2019-11-10 ENCOUNTER — Other Ambulatory Visit: Payer: Self-pay

## 2019-11-10 ENCOUNTER — Emergency Department
Admission: EM | Admit: 2019-11-10 | Discharge: 2019-11-10 | Disposition: A | Discharge status: Home / Self Care | Payer: No Typology Code available for payment source | Attending: Emergency Medicine | Admitting: Emergency Medicine

## 2019-11-10 DIAGNOSIS — W268XXA Contact with other sharp object(s), not elsewhere classified, initial encounter: Secondary | ICD-10-CM | POA: Insufficient documentation

## 2019-11-10 DIAGNOSIS — Z23 Encounter for immunization: Secondary | ICD-10-CM | POA: Diagnosis not present

## 2019-11-10 DIAGNOSIS — Z992 Dependence on renal dialysis: Secondary | ICD-10-CM | POA: Insufficient documentation

## 2019-11-10 DIAGNOSIS — Z940 Kidney transplant status: Secondary | ICD-10-CM | POA: Diagnosis not present

## 2019-11-10 DIAGNOSIS — Z87891 Personal history of nicotine dependence: Secondary | ICD-10-CM | POA: Diagnosis not present

## 2019-11-10 DIAGNOSIS — Z7952 Long term (current) use of systemic steroids: Secondary | ICD-10-CM | POA: Insufficient documentation

## 2019-11-10 DIAGNOSIS — Z881 Allergy status to other antibiotic agents status: Secondary | ICD-10-CM | POA: Insufficient documentation

## 2019-11-10 DIAGNOSIS — S81811A Laceration without foreign body, right lower leg, initial encounter: Secondary | ICD-10-CM | POA: Insufficient documentation

## 2019-11-10 DIAGNOSIS — Z79899 Other long term (current) drug therapy: Secondary | ICD-10-CM | POA: Diagnosis not present

## 2019-11-10 DIAGNOSIS — Z7982 Long term (current) use of aspirin: Secondary | ICD-10-CM | POA: Insufficient documentation

## 2019-11-10 DIAGNOSIS — S8991XA Unspecified injury of right lower leg, initial encounter: Secondary | ICD-10-CM | POA: Diagnosis present

## 2019-11-10 MED ORDER — TETANUS-DIPHTH-ACELL PERTUSSIS 5-2.5-18.5 LF-MCG/0.5 IM SUSP: 0.5000 mL | Freq: Once | INTRAMUSCULAR | Status: DC

## 2019-11-10 MED ORDER — LIDOCAINE HCL (PF) 1 % IJ SOLN
5.0000 mL | Freq: Once | INTRAMUSCULAR | Status: AC
  Administered 2019-11-10: 5 mL
  Filled 2019-11-10: qty 5

## 2019-11-10 NOTE — ED Triage Notes (Signed)
Pt in via POV, reports cutting RLE on edge of door at home, laceration noted to RLE, new dressing placed per this RN.  NAD noted.

## 2019-11-10 NOTE — ED Notes (Signed)
Patient declined discharge vital signs. 

## 2019-11-10 NOTE — ED Notes (Signed)
Pt was opening car door and hit right calf with door. Pt's left calf bandaged, moderate amount of blood soaked through bandage. Pt is on dialysis, last treatment was Wednesday. Pt with bilateral lower extremity 4 + edema.

## 2019-11-10 NOTE — Discharge Instructions (Addendum)
Keep the wound clean, dry, and covered.  Apply a nonstick dressing daily.  Follow with your primary provider return to the ED for signs of infection or wound dehiscence.  See your primary provider or specialist for suture removal in 10 to 12 days.

## 2019-11-10 NOTE — ED Provider Notes (Signed)
[facility] Emergency Department Provider Note ____________________________________________  Time seen: 1919  I have reviewed the triage vital signs and the nursing notes.  HISTORY  Chief Complaint  Extremity Laceration  HPI [name] is a 49 y.o. male with a history of nephrectomy with transplant on dialysis, lupus, and peripheral dependent edema, who presents to the ED with a laceration to the lower right leg.  He describes opening his car door, and when he walked past he apparently cut his anterior leg.  He did not initially realize that he had a significant cut until he realized that his pants were wet with blood and blood was reviewed.his leg.  The leg was complicated by his significant distal peripheral edema and fluid retention that is typical before he dialyzes.  He denies any other injury at this time.  Patient reports a current tetanus status via the [place].  Past Medical History:  Diagnosis Date  . Anemia   . Collagen vascular disease ([place])   . Lupus ([place])   . Renal disorder   . Renal insufficiency   . Renal transplant recipient     Patient Active Problem List   Diagnosis Date Noted  . Murmur 03/25/2018  . Laboratory examination 03/25/2018  . Tendonitis of both rotator cuffs 12/29/2017  . Greater trochanteric bursitis of both hips 12/29/2017  . Diverticulitis of large intestine without perforation or abscess without bleeding   . Diverticulitis 07/01/2017  . 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 At risk for sepsis 09/08/2015  . GI bleed 12/09/2014    Past Surgical History:  Procedure Laterality Date  . NEPHRECTOMY TRANSPLANTED ORGAN      Prior to Admission medications   Medication Sig Start Date End Date Taking? Authorizing Provider  aspirin EC 81 MG tablet Take 81 mg by mouth daily.    [provider]  cinacalcet (SENSIPAR) 30 MG tablet Take 1 tablet by mouth daily. 02/16/18    [provider]  diclofenac sodium (VOLTAREN) 1 % GEL Apply 1 application topically 3 (three) times daily. To hands, knees, feet 11/10/17   Meredith Staggers, MD  ferrous sulfate 324 (65 FE) MG TBEC Take 1 tablet (325 mg total) by mouth 2 (two) times daily. 12/10/14   Vaughan Basta, MD  HYDROcodone-acetaminophen (NORCO) 10-325 MG tablet Take 1 tablet by mouth every 8 (eight) hours as needed. Do Not Fill Before 10/22/2019 10/18/19   Bayard Hugger, NP  pantoprazole (PROTONIX) 40 MG tablet Take 1 tablet (40 mg total) by mouth 2 (two) times daily. 12/10/14   Vaughan Basta, MD  predniSONE (DELTASONE) 5 MG tablet Take 5 mg by mouth daily. 01/29/18   [provider]  promethazine (PHENERGAN) 25 MG tablet Take 25 mg by mouth every 6 (six) hours as needed for nausea or vomiting.    [provider]  tamsulosin (FLOMAX) 0.4 MG CAPS capsule Take 0.8 mg by mouth as needed.     [provider]  traZODone (DESYREL) 50 MG tablet Take 50 mg by mouth at bedtime.    [provider]    Allergies Ciprofloxacin and Sulfa antibiotics  Family History  Problem Relation Age of Onset  . Hypertension Other   . Brain cancer Mother        Died at age 54  . Aneurysm Mother   . CAD Father   . Heart attack Father 42    Social History Social History   Tobacco Use  .  Smoking status: Former Smoker    Packs/day: 0.50    Years: 13.00    Pack years: 6.50    Types: Cigarettes  . Smokeless tobacco: Never Used  Vaping Use  . Vaping Use: Never used  Substance Use Topics  . Alcohol use: Yes    Comment: Occasional  . Drug use: No    Review of Systems  Constitutional: Negative for fever. Eyes: Negative for visual changes. ENT: Negative for sore throat. Cardiovascular: Negative for chest pain. Respiratory: Negative for shortness of breath. Gastrointestinal: Negative for abdominal pain, vomiting and diarrhea. Genitourinary: Negative for  dysuria. Musculoskeletal: Negative for back pain. Skin: Negative for rash. Right leg laceration as above Neurological: Negative for headaches, focal weakness or numbness. ____________________________________________  PHYSICAL EXAM:  VITAL SIGNS: ED Triage Vitals  Enc Vitals Group     BP 11/10/19 1848 (!) 99/57     Pulse Rate 11/10/19 1848 (!) 59     Resp 11/10/19 1848 17     Temp 11/10/19 1848 99.1 F (37.3 C)     Temp Source 11/10/19 1848 Oral     SpO2 11/10/19 1848 95 %     Weight --      Height --      Head Circumference --      Peak Flow --      Pain Score 11/10/19 1850 8     Pain Loc --      Pain Edu? --      Excl. in [place]? --     Constitutional: Alert and oriented. Well appearing and in no distress. Head: Normocephalic and atraumatic. Eyes: Conjunctivae are normal. Normal extraocular movements Cardiovascular: Normal rate, regular rhythm. Normal distal pulses.  Significant lower extremity edema and fluid retention noted. Respiratory: Normal respiratory effort. No wheezes/rales/rhonchi. Gastrointestinal: Soft and nontender. No distention. Musculoskeletal: Right anterior leg with a 4 cm semicircular laceration to the mid shin.  No active bleeding noted at this time.  Nontender with normal range of motion in all extremities.  Neurologic:  Normal gait without ataxia. Normal speech and language. No gross focal neurologic deficits are appreciated. Skin:  Skin is warm, dry and intact. No rash noted. Psychiatric: Mood and affect are normal. Patient exhibits appropriate insight and judgment. ___________________________________________  PROCEDURES  .Marland KitchenLaceration Repair  Date/Time: 11/10/2019 7:50 PM Performed by: Melvenia Needles, PA-C Authorized by: Melvenia Needles, PA-C   Consent:    Consent obtained:  Verbal   Consent given by:  Patient   Risks discussed:  Pain and poor wound healing Anesthesia (see MAR for exact dosages):    Anesthesia method:  Local  infiltration   Local anesthetic:  Lidocaine 1% w/o epi Laceration details:    Location:  Leg   Leg location:  R lower leg   Length (cm):  4   Depth (mm):  5 Repair type:    Repair type:  Simple Pre-procedure details:    Preparation:  Patient was prepped and draped in usual sterile fashion Treatment:    Area cleansed with:  Saline and Betadine   Irrigation solution:  Sterile saline Skin repair:    Repair method:  Sutures   Suture size:  4-0   Suture material:  Nylon   Suture technique:  Horizontal mattress   Number of sutures:  6 Approximation:    Approximation:  Close Post-procedure details:    Dressing:  Non-adherent dressing and bulky dressing   Patient tolerance of procedure:  Tolerated well, no immediate complications  ____________________________________________  INITIAL IMPRESSION / ASSESSMENT AND PLAN / ED COURSE  Patient with ED evaluation of accidental laceration to the anterior leg.  Patient's wound was repaired using sutures.  Good wound edge approximation was achieved, but the wound continued to weep secondary to the patient's peripheral edema.  The wound had to be redressed with Surgicel, Telfa, ABD pad, Kling wrap, and tube gauze prior to patient discharge.  He is expected to dialyze tomorrow and reports that he should have significant reduction in his fluid retention.  He will follow-up with primary provider in 10 to 12 days for suture removal.  Return precautions have been reviewed.  OSCEOLA DEPAZ was evaluated in Emergency Department on 11/11/2019 for the symptoms described in the history of present illness. He was evaluated in the context of the global COVID-19 pandemic, which necessitated consideration that the patient might be at risk for infection with the SARS-CoV-2 virus that causes COVID-19. Institutional protocols and algorithms that pertain to the evaluation of patients at risk for COVID-19 are in a state of rapid change based on information released by  regulatory bodies including the CDC and federal and state organizations. These policies and algorithms were followed during the patient's care in the ED. ____________________________________________  FINAL CLINICAL IMPRESSION(S) / ED DIAGNOSES  Final diagnoses:  Laceration of right lower extremity, initial encounter      Melvenia Needles, PA-C 11/11/19 2255    Lucrezia Starch, MD 11/12/19 [phone]

## 2019-11-15 ENCOUNTER — Encounter: Payer: Self-pay | Admitting: Registered Nurse

## 2019-11-15 ENCOUNTER — Other Ambulatory Visit: Payer: Self-pay

## 2019-11-15 ENCOUNTER — Encounter
Payer: No Typology Code available for payment source | Attending: Physical Medicine & Rehabilitation | Admitting: Registered Nurse

## 2019-11-15 VITALS — BP 93/46 | HR 66 | Temp 98.0°F | Ht 74.0 in | Wt 210.2 lb

## 2019-11-15 DIAGNOSIS — M79672 Pain in left foot: Secondary | ICD-10-CM

## 2019-11-15 DIAGNOSIS — Z79891 Long term (current) use of opiate analgesic: Secondary | ICD-10-CM | POA: Diagnosis present

## 2019-11-15 DIAGNOSIS — L930 Discoid lupus erythematosus: Secondary | ICD-10-CM

## 2019-11-15 DIAGNOSIS — M25561 Pain in right knee: Secondary | ICD-10-CM | POA: Insufficient documentation

## 2019-11-15 DIAGNOSIS — M545 Low back pain, unspecified: Secondary | ICD-10-CM | POA: Insufficient documentation

## 2019-11-15 DIAGNOSIS — G8929 Other chronic pain: Secondary | ICD-10-CM

## 2019-11-15 DIAGNOSIS — M79671 Pain in right foot: Secondary | ICD-10-CM | POA: Insufficient documentation

## 2019-11-15 DIAGNOSIS — G894 Chronic pain syndrome: Secondary | ICD-10-CM | POA: Diagnosis present

## 2019-11-15 DIAGNOSIS — Z5181 Encounter for therapeutic drug level monitoring: Secondary | ICD-10-CM | POA: Diagnosis present

## 2019-11-15 DIAGNOSIS — M2550 Pain in unspecified joint: Secondary | ICD-10-CM

## 2019-11-15 DIAGNOSIS — M25562 Pain in left knee: Secondary | ICD-10-CM | POA: Insufficient documentation

## 2019-11-15 MED ORDER — HYDROCODONE-ACETAMINOPHEN 10-325 MG PO TABS
1.0000 | ORAL_TABLET | Freq: Three times a day (TID) | ORAL | 0 refills | Status: DC | PRN
Start: 2019-11-15 — End: 2019-12-17

## 2019-11-15 NOTE — Progress Notes (Signed)
Subjective:    Patient ID: [name], male    DOB: 1970-02-13, 49 y.o.   MRN: [id number]  HPI: [name] is a 49 y.o. male who returns for follow up appointment for chronic pain and medication refill. He states his pain is located in his lower back , right leg pain ( post laceration pain), bilateral feet pain and generalized joint pain. He rates his pain 6. His current exercise regime is walking and performing stretching exercises.  [name] was seen at [facility] ED on 11/10/2019, note was reviewed. Right lower extremity laceration sutures intact, site was cleansed and new dressing applied.   [name] Morphine equivalent is 30.00 MME.  Last oral Swab was Performed on 07/19/2019, it was consistent.  Pain Inventory Average Pain 5 Pain Right Now 6 My pain is sharp, burning and aching  In the last 24 hours, has pain interfered with the following? General activity 1 Relation with others 1 Enjoyment of life 0 What TIME of day is your pain at its worst? morning  Sleep (in general) Good  Pain is worse with: walking and standing Pain improves with: rest and medication Relief from Meds: 5  Family History  Problem Relation Age of Onset  . Hypertension Other   . Brain cancer Mother        Died at age 38  . Aneurysm Mother   . CAD Father   . Heart attack Father 51   Social History   Socioeconomic History  . Marital status: Divorced    Spouse name: Not on file  . Number of children: 2  . Years of education: Not on file  . Highest education level: Not on file  Occupational History  . Not on file  Tobacco Use  . Smoking status: Former Smoker    Packs/day: 0.50    Years: 13.00    Pack years: 6.50    Types: Cigarettes  . Smokeless tobacco: Never Used  Vaping Use  . Vaping Use: Never used  Substance and Sexual Activity  . Alcohol use: Yes    Comment: Occasional  . Drug use: No  . Sexual activity: Not on file  Other Topics Concern  . Not on file   Social History Narrative  . Not on file   Social Determinants of Health   Financial Resource Strain:   . Difficulty of Paying Living Expenses: Not on file  Food Insecurity:   . Worried About Charity fundraiser in the Last Year: Not on file  . Ran Out of Food in the Last Year: Not on file  Transportation Needs:   . Lack of Transportation (Medical): Not on file  . Lack of Transportation (Non-Medical): Not on file  Physical Activity:   . Days of Exercise per Week: Not on file  . Minutes of Exercise per Session: Not on file  Stress:   . Feeling of Stress : Not on file  Social Connections:   . Frequency of Communication with Friends and Family: Not on file  . Frequency of Social Gatherings with Friends and Family: Not on file  . Attends Religious Services: Not on file  . Active Member of Clubs or Organizations: Not on file  . Attends Archivist Meetings: Not on file  . Marital Status: Not on file   Past Surgical History:  Procedure Laterality Date  . NEPHRECTOMY TRANSPLANTED ORGAN     Past Surgical History:  Procedure Laterality Date  . NEPHRECTOMY TRANSPLANTED ORGAN  Past Medical History:  Diagnosis Date  . Anemia   . Collagen vascular disease ([place])   . Lupus ([place])   . Renal disorder   . Renal insufficiency   . Renal transplant recipient    BP (!) 93/46   Pulse 66   Temp 98 F (36.7 C)   Ht 6\' 2"  (1.88 m)   Wt 210 lb 3.2 oz (95.3 kg)   SpO2 93%   BMI 26.99 kg/m   Opioid Risk Score:   Fall Risk Score:  `1  Depression screen PHQ 2/9  Depression screen [facility] 2/9 11/15/2019 06/09/2019 05/03/2019 06/24/2018 04/27/2018  Decreased Interest 0 0 0 0 0  Down, Depressed, Hopeless 0 0 0 0 0  PHQ - 2 Score 0 0 0 0 0    Review of Systems  Constitutional: Negative.   HENT: Negative.   Eyes: Negative.   Respiratory: Negative.   Cardiovascular: Negative.   Gastrointestinal: Negative.   Endocrine: Negative.   Genitourinary: Negative.   Musculoskeletal:  Positive for arthralgias.  Skin: Negative.   Allergic/Immunologic: Negative.   Neurological: Positive for weakness.  Hematological: Negative.   Psychiatric/Behavioral: Negative.   All other systems reviewed and are negative.      Objective:   Physical Exam Vitals and nursing note reviewed.  Constitutional:      Appearance: Normal appearance.  Cardiovascular:     Rate and Rhythm: Normal rate and regular rhythm.     Pulses: Normal pulses.     Heart sounds: Normal heart sounds.  Pulmonary:     Effort: Pulmonary effort is normal.     Breath sounds: Normal breath sounds.  Musculoskeletal:     Cervical back: Normal range of motion and neck supple.     Right lower leg: Edema present.     Left lower leg: Edema present.     Comments: Normal Muscle Bulk and Muscle Testing Reveals:  Upper Extremities:Full ROM and Muscle Strength 5/5 Left AVF + Bruit and Thrill  Lumbar Paraspinal Tenderness: L-4-L-5 Lower Extremities: Full ROM and Muscle Strength 5/5 Arises from Table Slowly Antalgic  Gait   Skin:    General: Skin is warm and dry.  Neurological:     Mental Status: He is alert and oriented to person, place, and time.  Psychiatric:        Mood and Affect: Mood normal.        Behavior: Behavior normal.           Assessment & Plan:  1. Systemic Lupus Multiple Joint Involvement/ ESRD: Continue HEP as Tolerated. Left AVF+ bruit and thrill. 11/15/2019 Rheumatology and Nephrology Following.  2. Tendonitis of both rotator cuffs: No complaints today.Continue HEP as Tolerated. Continue to Monitor.11/15/2019 3. Chronic Pain Syndrome: Refilled: Hydrocodone 10/325 mg one table every 8 hours as needed for pain. #90.Continue Voltaren Gel.11/15/2019. We will continue the opioid monitoring program, this consists of regular clinic visits, examinations, urine drug screen, pill counts as well as use of [place] Controlled Substance Reporting system. A 12 month History has been  reviewed on the [place] Controlled Substance Reporting Systemon 11/15/2019. 4. Polyarthralgia: Continue to Monitor.11/15/2019 5. Greater Trochanter Bursitis:Continue to Alternate Ice and Heat Therapy. Continue to Monitor.11/15/2019 6. Chronic Bilateral Knee Pain:Continue HEP as Tolerated and Continue to Monitor.11/15/2019 7.Bilateral feet Pain: Continue HEP as Tolerated. Continue to Monitor.11/15/2019. 8. Chronic Bilateral Thoracic Pain:No complaints today.Continue current medication regimen. Continue to monitor.11/15/2019 9. Chronic Left Shoulder Pain:No Complaints today.Continue HEP as Tolerated. Continue to Monitor.11/15/2019.  82minutes  of face to face patient care time was spent during this visit. All questions were encouraged and answered.  F/U in 1 month

## 2019-11-17 ENCOUNTER — Encounter: Payer: Self-pay | Admitting: Emergency Medicine

## 2019-11-17 ENCOUNTER — Inpatient Hospital Stay
Admission: EM | Admit: 2019-11-17 | Discharge: 2019-12-05 | DRG: 871 | Discharge status: Against Medical Advice | Payer: No Typology Code available for payment source | Attending: Student | Admitting: Student

## 2019-11-17 ENCOUNTER — Ambulatory Visit: Payer: No Typology Code available for payment source | Admitting: Registered Nurse

## 2019-11-17 ENCOUNTER — Other Ambulatory Visit: Payer: Self-pay

## 2019-11-17 ENCOUNTER — Inpatient Hospital Stay: Payer: No Typology Code available for payment source

## 2019-11-17 ENCOUNTER — Emergency Department: Payer: No Typology Code available for payment source

## 2019-11-17 DIAGNOSIS — R6520 Severe sepsis without septic shock: Secondary | ICD-10-CM | POA: Diagnosis not present

## 2019-11-17 DIAGNOSIS — Z992 Dependence on renal dialysis: Secondary | ICD-10-CM

## 2019-11-17 DIAGNOSIS — Z940 Kidney transplant status: Secondary | ICD-10-CM

## 2019-11-17 DIAGNOSIS — Z20822 Contact with and (suspected) exposure to covid-19: Secondary | ICD-10-CM | POA: Diagnosis present

## 2019-11-17 DIAGNOSIS — R5381 Other malaise: Secondary | ICD-10-CM | POA: Diagnosis not present

## 2019-11-17 DIAGNOSIS — I495 Sick sinus syndrome: Secondary | ICD-10-CM | POA: Diagnosis not present

## 2019-11-17 DIAGNOSIS — S81801A Unspecified open wound, right lower leg, initial encounter: Secondary | ICD-10-CM | POA: Diagnosis not present

## 2019-11-17 DIAGNOSIS — K219 Gastro-esophageal reflux disease without esophagitis: Secondary | ICD-10-CM | POA: Diagnosis present

## 2019-11-17 DIAGNOSIS — Z7952 Long term (current) use of systemic steroids: Secondary | ICD-10-CM | POA: Diagnosis not present

## 2019-11-17 DIAGNOSIS — I480 Paroxysmal atrial fibrillation: Secondary | ICD-10-CM | POA: Diagnosis present

## 2019-11-17 DIAGNOSIS — Z5329 Procedure and treatment not carried out because of patient's decision for other reasons: Secondary | ICD-10-CM | POA: Diagnosis not present

## 2019-11-17 DIAGNOSIS — R079 Chest pain, unspecified: Secondary | ICD-10-CM | POA: Diagnosis not present

## 2019-11-17 DIAGNOSIS — L03115 Cellulitis of right lower limb: Secondary | ICD-10-CM | POA: Diagnosis present

## 2019-11-17 DIAGNOSIS — A419 Sepsis, unspecified organism: Secondary | ICD-10-CM | POA: Diagnosis not present

## 2019-11-17 DIAGNOSIS — N186 End stage renal disease: Secondary | ICD-10-CM | POA: Diagnosis present

## 2019-11-17 DIAGNOSIS — Z881 Allergy status to other antibiotic agents status: Secondary | ICD-10-CM

## 2019-11-17 DIAGNOSIS — N189 Chronic kidney disease, unspecified: Secondary | ICD-10-CM | POA: Diagnosis not present

## 2019-11-17 DIAGNOSIS — E872 Acidosis, unspecified: Secondary | ICD-10-CM | POA: Diagnosis present

## 2019-11-17 DIAGNOSIS — Z9119 Patient's noncompliance with other medical treatment and regimen: Secondary | ICD-10-CM

## 2019-11-17 DIAGNOSIS — Z1619 Resistance to other specified beta lactam antibiotics: Secondary | ICD-10-CM | POA: Diagnosis present

## 2019-11-17 DIAGNOSIS — D631 Anemia in chronic kidney disease: Secondary | ICD-10-CM | POA: Diagnosis present

## 2019-11-17 DIAGNOSIS — T8611 Kidney transplant rejection: Secondary | ICD-10-CM | POA: Diagnosis not present

## 2019-11-17 DIAGNOSIS — R001 Bradycardia, unspecified: Secondary | ICD-10-CM | POA: Diagnosis not present

## 2019-11-17 DIAGNOSIS — Z79899 Other long term (current) drug therapy: Secondary | ICD-10-CM

## 2019-11-17 DIAGNOSIS — D72829 Elevated white blood cell count, unspecified: Secondary | ICD-10-CM

## 2019-11-17 DIAGNOSIS — R443 Hallucinations, unspecified: Secondary | ICD-10-CM | POA: Diagnosis not present

## 2019-11-17 DIAGNOSIS — Z7982 Long term (current) use of aspirin: Secondary | ICD-10-CM | POA: Diagnosis not present

## 2019-11-17 DIAGNOSIS — I455 Other specified heart block: Secondary | ICD-10-CM | POA: Diagnosis not present

## 2019-11-17 DIAGNOSIS — A498 Other bacterial infections of unspecified site: Secondary | ICD-10-CM | POA: Diagnosis not present

## 2019-11-17 DIAGNOSIS — R0602 Shortness of breath: Secondary | ICD-10-CM

## 2019-11-17 DIAGNOSIS — R6521 Severe sepsis with septic shock: Secondary | ICD-10-CM | POA: Diagnosis present

## 2019-11-17 DIAGNOSIS — N2581 Secondary hyperparathyroidism of renal origin: Secondary | ICD-10-CM | POA: Diagnosis present

## 2019-11-17 DIAGNOSIS — G9341 Metabolic encephalopathy: Secondary | ICD-10-CM | POA: Diagnosis present

## 2019-11-17 DIAGNOSIS — R600 Localized edema: Secondary | ICD-10-CM | POA: Diagnosis not present

## 2019-11-17 DIAGNOSIS — T8612 Kidney transplant failure: Secondary | ICD-10-CM | POA: Diagnosis not present

## 2019-11-17 DIAGNOSIS — I472 Ventricular tachycardia: Secondary | ICD-10-CM | POA: Diagnosis not present

## 2019-11-17 DIAGNOSIS — E875 Hyperkalemia: Secondary | ICD-10-CM | POA: Diagnosis not present

## 2019-11-17 DIAGNOSIS — L089 Local infection of the skin and subcutaneous tissue, unspecified: Secondary | ICD-10-CM | POA: Diagnosis not present

## 2019-11-17 DIAGNOSIS — E8770 Fluid overload, unspecified: Secondary | ICD-10-CM | POA: Diagnosis present

## 2019-11-17 DIAGNOSIS — I493 Ventricular premature depolarization: Secondary | ICD-10-CM | POA: Diagnosis not present

## 2019-11-17 DIAGNOSIS — I959 Hypotension, unspecified: Secondary | ICD-10-CM

## 2019-11-17 DIAGNOSIS — Z905 Acquired absence of kidney: Secondary | ICD-10-CM

## 2019-11-17 DIAGNOSIS — A4152 Sepsis due to Pseudomonas: Principal | ICD-10-CM | POA: Diagnosis present

## 2019-11-17 DIAGNOSIS — R609 Edema, unspecified: Secondary | ICD-10-CM

## 2019-11-17 DIAGNOSIS — E162 Hypoglycemia, unspecified: Secondary | ICD-10-CM | POA: Diagnosis present

## 2019-11-17 DIAGNOSIS — M329 Systemic lupus erythematosus, unspecified: Secondary | ICD-10-CM | POA: Diagnosis present

## 2019-11-17 DIAGNOSIS — L97919 Non-pressure chronic ulcer of unspecified part of right lower leg with unspecified severity: Secondary | ICD-10-CM | POA: Diagnosis present

## 2019-11-17 DIAGNOSIS — D509 Iron deficiency anemia, unspecified: Secondary | ICD-10-CM | POA: Diagnosis present

## 2019-11-17 DIAGNOSIS — Z87891 Personal history of nicotine dependence: Secondary | ICD-10-CM | POA: Diagnosis not present

## 2019-11-17 DIAGNOSIS — K529 Noninfective gastroenteritis and colitis, unspecified: Secondary | ICD-10-CM | POA: Diagnosis present

## 2019-11-17 DIAGNOSIS — Z23 Encounter for immunization: Secondary | ICD-10-CM | POA: Diagnosis not present

## 2019-11-17 DIAGNOSIS — L89611 Pressure ulcer of right heel, stage 1: Secondary | ICD-10-CM | POA: Diagnosis not present

## 2019-11-17 DIAGNOSIS — D649 Anemia, unspecified: Secondary | ICD-10-CM | POA: Diagnosis present

## 2019-11-17 DIAGNOSIS — D6959 Other secondary thrombocytopenia: Secondary | ICD-10-CM | POA: Diagnosis present

## 2019-11-17 DIAGNOSIS — I499 Cardiac arrhythmia, unspecified: Secondary | ICD-10-CM | POA: Diagnosis not present

## 2019-11-17 DIAGNOSIS — Z9114 Patient's other noncompliance with medication regimen: Secondary | ICD-10-CM | POA: Diagnosis not present

## 2019-11-17 DIAGNOSIS — G894 Chronic pain syndrome: Secondary | ICD-10-CM | POA: Diagnosis present

## 2019-11-17 LAB — CBC WITH DIFFERENTIAL/PLATELET
Abs Immature Granulocytes: 0.07 10*3/uL (ref 0.00–0.07)
Basophils Absolute: 0 10*3/uL (ref 0.0–0.1)
Basophils Relative: 0 %
Eosinophils Absolute: 0.1 10*3/uL (ref 0.0–0.5)
Eosinophils Relative: 1 %
HCT: 28.4 % — ABNORMAL LOW (ref 39.0–52.0)
Hemoglobin: 8.9 g/dL — ABNORMAL LOW (ref 13.0–17.0)
Immature Granulocytes: 1 %
Lymphocytes Relative: 6 %
Lymphs Abs: 0.5 10*3/uL — ABNORMAL LOW (ref 0.7–4.0)
MCH: 29.3 pg (ref 26.0–34.0)
MCHC: 31.3 g/dL (ref 30.0–36.0)
MCV: 93.4 fL (ref 80.0–100.0)
Monocytes Absolute: 0.2 10*3/uL (ref 0.1–1.0)
Monocytes Relative: 2 %
Neutro Abs: 8.9 10*3/uL — ABNORMAL HIGH (ref 1.7–7.7)
Neutrophils Relative %: 90 %
Platelets: 117 10*3/uL — ABNORMAL LOW (ref 150–400)
RBC: 3.04 MIL/uL — ABNORMAL LOW (ref 4.22–5.81)
RDW: 16.7 % — ABNORMAL HIGH (ref 11.5–15.5)
Smear Review: DECREASED
WBC: 9.8 10*3/uL (ref 4.0–10.5)
nRBC: 0 % (ref 0.0–0.2)

## 2019-11-17 LAB — COMPREHENSIVE METABOLIC PANEL
ALT: 8 U/L (ref 0–44)
AST: 26 U/L (ref 15–41)
Albumin: 3.4 g/dL — ABNORMAL LOW (ref 3.5–5.0)
Alkaline Phosphatase: 129 U/L — ABNORMAL HIGH (ref 38–126)
Anion gap: 15 (ref 5–15)
BUN: 18 mg/dL (ref 6–20)
CO2: 27 mmol/L (ref 22–32)
Calcium: 9.5 mg/dL (ref 8.9–10.3)
Chloride: 97 mmol/L — ABNORMAL LOW (ref 98–111)
Creatinine, Ser: 4.17 mg/dL — ABNORMAL HIGH (ref 0.61–1.24)
GFR, Estimated: 17 mL/min — ABNORMAL LOW (ref >60)
Glucose, Bld: 61 mg/dL — ABNORMAL LOW (ref 70–99)
Potassium: 4.1 mmol/L (ref 3.5–5.1)
Sodium: 139 mmol/L (ref 135–145)
Total Bilirubin: 1.6 mg/dL — ABNORMAL HIGH (ref 0.3–1.2)
Total Protein: 7.2 g/dL (ref 6.5–8.1)

## 2019-11-17 LAB — LACTIC ACID, PLASMA
Lactic Acid, Venous: 3.2 mmol/L (ref 0.5–1.9)
Lactic Acid, Venous: 3.3 mmol/L (ref 0.5–1.9)
Lactic Acid, Venous: 3.6 mmol/L (ref 0.5–1.9)

## 2019-11-17 LAB — MAGNESIUM: Magnesium: 2.1 mg/dL (ref 1.7–2.4)

## 2019-11-17 LAB — CBG MONITORING, ED
Glucose-Capillary: 70 mg/dL (ref 70–99)
Glucose-Capillary: 46 mg/dL — ABNORMAL LOW (ref 70–99)
Glucose-Capillary: 93 mg/dL (ref 70–99)

## 2019-11-17 LAB — C-REACTIVE PROTEIN: CRP: 3.5 mg/dL — ABNORMAL HIGH (ref <1.0)

## 2019-11-17 LAB — APTT: aPTT: 39 seconds — ABNORMAL HIGH (ref 24–36)

## 2019-11-17 LAB — CK: Total CK: 136 U/L (ref 49–397)

## 2019-11-17 LAB — RESPIRATORY PANEL BY RT PCR (FLU A&B, COVID)
Influenza A by PCR: NEGATIVE
Influenza B by PCR: NEGATIVE
SARS Coronavirus 2 by RT PCR: NEGATIVE

## 2019-11-17 LAB — PROTIME-INR
INR: 1.2 (ref 0.8–1.2)
Prothrombin Time: 14.8 seconds (ref 11.4–15.2)

## 2019-11-17 MED ORDER — SODIUM CHLORIDE 0.9 % IV SOLN
2.0000 g | Freq: Once | INTRAVENOUS | Status: AC
  Administered 2019-11-17: 2 g via INTRAVENOUS
  Filled 2019-11-17: qty 20

## 2019-11-17 MED ORDER — LACTATED RINGERS IV BOLUS
250.0000 mL | Freq: Once | INTRAVENOUS | Status: AC
  Administered 2019-11-17: 250 mL via INTRAVENOUS

## 2019-11-17 MED ORDER — HYDROMORPHONE HCL 1 MG/ML IJ SOLN
1.0000 mg | Freq: Once | INTRAMUSCULAR | Status: AC
  Administered 2019-11-17: 1 mg via INTRAVENOUS
  Filled 2019-11-17: qty 1

## 2019-11-17 MED ORDER — VANCOMYCIN HCL 750 MG/150ML IV SOLN
750.0000 mg | INTRAVENOUS | Status: DC
  Filled 2019-11-17: qty 150

## 2019-11-17 MED ORDER — VANCOMYCIN HCL IN DEXTROSE 1-5 GM/200ML-% IV SOLN: 1000.0000 mg | INTRAVENOUS | Status: DC

## 2019-11-17 MED ORDER — PANTOPRAZOLE SODIUM 40 MG PO TBEC
40.0000 mg | DELAYED_RELEASE_TABLET | Freq: Two times a day (BID) | ORAL | Status: DC
  Administered 2019-11-17: 40 mg via ORAL
  Filled 2019-11-17: qty 1

## 2019-11-17 MED ORDER — SODIUM CHLORIDE 0.9 % IV SOLN: 1.0000 g | INTRAVENOUS | Status: DC

## 2019-11-17 MED ORDER — SODIUM CHLORIDE 0.9 % IV BOLUS (SEPSIS)
1000.0000 mL | Freq: Once | INTRAVENOUS | Status: AC
  Administered 2019-11-17: 1000 mL via INTRAVENOUS

## 2019-11-17 MED ORDER — FERROUS SULFATE 325 (65 FE) MG PO TABS
325.0000 mg | ORAL_TABLET | Freq: Two times a day (BID) | ORAL | Status: DC
  Administered 2019-11-19 – 2019-12-05 (×23): 325 mg via ORAL
  Filled 2019-11-17 (×25): qty 1

## 2019-11-17 MED ORDER — CLINDAMYCIN PHOSPHATE 900 MG/50ML IV SOLN: 900.0000 mg | Freq: Once | INTRAVENOUS | Status: DC

## 2019-11-17 MED ORDER — PREDNISONE 10 MG PO TABS
10.0000 mg | ORAL_TABLET | Freq: Every day | ORAL | Status: DC
  Administered 2019-11-17: 10 mg via ORAL
  Filled 2019-11-17: qty 1

## 2019-11-17 MED ORDER — MIDODRINE HCL 5 MG PO TABS
20.0000 mg | ORAL_TABLET | ORAL | Status: DC
  Administered 2019-11-17 – 2019-11-19 (×2): 20 mg via ORAL
  Filled 2019-11-17 (×3): qty 4

## 2019-11-17 MED ORDER — NOREPINEPHRINE 4 MG/250ML-% IV SOLN
2.0000 ug/min | INTRAVENOUS | Status: DC
  Administered 2019-11-17: 2 ug/min via INTRAVENOUS
  Filled 2019-11-17: qty 250

## 2019-11-17 MED ORDER — SODIUM CHLORIDE 0.9 % IV SOLN
250.0000 mL | INTRAVENOUS | Status: DC | PRN
  Administered 2019-11-23 – 2019-12-02 (×3): 250 mL via INTRAVENOUS

## 2019-11-17 MED ORDER — ACETAMINOPHEN 650 MG RE SUPP: 650.0000 mg | Freq: Four times a day (QID) | RECTAL | Status: DC | PRN

## 2019-11-17 MED ORDER — MELATONIN 3 MG PO TABS
3.0000 mg | ORAL_TABLET | Freq: Every evening | ORAL | Status: DC | PRN
  Filled 2019-11-17: qty 1

## 2019-11-17 MED ORDER — SODIUM CHLORIDE 0.9 % IV SOLN
2.0000 g | Freq: Once | INTRAVENOUS | Status: AC
  Administered 2019-11-18: 2 g via INTRAVENOUS
  Filled 2019-11-17: qty 2

## 2019-11-17 MED ORDER — VANCOMYCIN HCL IN DEXTROSE 1-5 GM/200ML-% IV SOLN
1000.0000 mg | Freq: Once | INTRAVENOUS | Status: AC
  Administered 2019-11-17: 1000 mg via INTRAVENOUS
  Filled 2019-11-17: qty 200

## 2019-11-17 MED ORDER — HEPARIN SODIUM (PORCINE) 5000 UNIT/ML IJ SOLN
5000.0000 [IU] | Freq: Three times a day (TID) | INTRAMUSCULAR | Status: DC
  Administered 2019-11-17 – 2019-11-30 (×33): 5000 [IU] via SUBCUTANEOUS
  Filled 2019-11-17 (×37): qty 1

## 2019-11-17 MED ORDER — MORPHINE SULFATE (PF) 2 MG/ML IV SOLN
0.5000 mg | Freq: Once | INTRAVENOUS | Status: DC
Start: 2019-11-18 — End: 2019-11-18

## 2019-11-17 MED ORDER — ASPIRIN EC 81 MG PO TBEC
81.0000 mg | DELAYED_RELEASE_TABLET | Freq: Every day | ORAL | Status: DC
  Administered 2019-11-19 – 2019-12-04 (×13): 81 mg via ORAL
  Filled 2019-11-17 (×14): qty 1

## 2019-11-17 MED ORDER — SODIUM CHLORIDE 0.9 % IV SOLN
250.0000 mL | INTRAVENOUS | Status: DC
  Administered 2019-11-17 – 2019-11-19 (×2): 250 mL via INTRAVENOUS

## 2019-11-17 MED ORDER — ACETAMINOPHEN 325 MG PO TABS
650.0000 mg | ORAL_TABLET | Freq: Four times a day (QID) | ORAL | Status: DC | PRN
  Filled 2019-11-17 (×2): qty 2

## 2019-11-17 MED ORDER — TETANUS-DIPHTH-ACELL PERTUSSIS 5-2.5-18.5 LF-MCG/0.5 IM SUSY
0.5000 mL | PREFILLED_SYRINGE | Freq: Once | INTRAMUSCULAR | Status: AC
  Administered 2019-11-17: 0.5 mL via INTRAMUSCULAR
  Filled 2019-11-17: qty 0.5

## 2019-11-17 MED ORDER — SODIUM CHLORIDE 0.9% FLUSH
3.0000 mL | INTRAVENOUS | Status: DC | PRN
  Administered 2019-12-02: 3 mL via INTRAVENOUS

## 2019-11-17 MED ORDER — PREDNISONE 10 MG PO TABS: 10.0000 mg | ORAL_TABLET | Freq: Every day | ORAL | Status: DC

## 2019-11-17 MED ORDER — PIPERACILLIN-TAZOBACTAM 3.375 G IVPB 30 MIN: 3.3750 g | Freq: Once | INTRAVENOUS | Status: DC

## 2019-11-17 MED ORDER — VANCOMYCIN HCL 1250 MG/250ML IV SOLN
1250.0000 mg | Freq: Once | INTRAVENOUS | Status: DC
  Filled 2019-11-17: qty 250

## 2019-11-17 MED ORDER — SODIUM CHLORIDE 0.9 % IV BOLUS (SEPSIS): 1000.0000 mL | Freq: Once | INTRAVENOUS | Status: DC

## 2019-11-17 MED ORDER — SODIUM CHLORIDE 0.9% FLUSH
3.0000 mL | Freq: Two times a day (BID) | INTRAVENOUS | Status: DC
  Administered 2019-11-18 – 2019-12-05 (×27): 3 mL via INTRAVENOUS

## 2019-11-17 MED ORDER — PROMETHAZINE HCL 25 MG PO TABS: 25.0000 mg | ORAL_TABLET | Freq: Four times a day (QID) | ORAL | Status: DC | PRN

## 2019-11-17 MED ORDER — CLINDAMYCIN PHOSPHATE 600 MG/50ML IV SOLN
600.0000 mg | Freq: Three times a day (TID) | INTRAVENOUS | Status: DC
  Administered 2019-11-18 (×3): 600 mg via INTRAVENOUS
  Filled 2019-11-17 (×6): qty 50

## 2019-11-17 MED ORDER — DEXTROSE 50 % IV SOLN
1.0000 | INTRAVENOUS | Status: DC | PRN
  Administered 2019-11-17 – 2019-11-18 (×7): 50 mL via INTRAVENOUS
  Filled 2019-11-17 (×8): qty 50

## 2019-11-17 NOTE — Consult Note (Signed)
Pharmacy Antibiotic Note  [name] is a 49 y.o. male admitted on 11/17/2019 with cellulitis. Pt presented with pain and swelling of right leg in area of recent wound. PMH ESRD on HD TTS and lupus. According to RN patient received vancomycin 1000mg  and ceftazidime prior to hospital admission.  Pharmacy has been consulted for vancomycin dosing.  Plan: Vancomycin 1000 mg  IV with TTS hemodialysis.  Goal trough 10-15 mcg/mL.  Height: 6\' 2"  (188 cm) Weight: 95.3 kg (210 lb) IBW/kg (Calculated) : 82.2  Temp (24hrs), Avg:98.5 F (36.9 C), Min:98.4 F (36.9 C), Max:98.5 F (36.9 C)  Recent Labs  Lab 11/17/19 1405  WBC 9.8  CREATININE 4.17*  LATICACIDVEN 3.2*  3.3*    Estimated Creatinine Clearance: 24.6 mL/min (A) (by C-G formula based on SCr of 4.17 mg/dL (H)).    Allergies  Allergen Reactions  . Ciprofloxacin Rash  . Sulfa Antibiotics Rash    Antimicrobials this admission: According to RN patient received vancomycin 1000mg  and ceftazidime prior to hospital admission 10/28 vancomycin >>  10/28 ceftriaxone >>   Microbiology results: 10/28 BCx: pending 10/28 UCx: pending  10/28 wound: pending  Thank you for allowing pharmacy to be a part of this patient's care.  Benn Moulder, PharmD Pharmacy Resident  11/17/2019 6:40 PM

## 2019-11-17 NOTE — ED Notes (Signed)
Assumed care of pt at 2300, pt intermittently alert and sleeping, shouting "peter pan," unable to hold conversation or answer orientation questions. Norepi infusing and titrated per MAR. MAPs remain high 40s and low 50s. Breathing is regular and unlabored. Wound with bleeding controlled noted to RLE on lateral shin. + 3 pitting edema to BLE, +1 bilateral pedal pulses

## 2019-11-17 NOTE — Progress Notes (Signed)
1635 asked about the other liter of fluid that was ordered, asked about updated blood pressures

## 2019-11-17 NOTE — H&P (Signed)
History and Physical    PLEASE NOTE THAT DRAGON DICTATION SOFTWARE WAS USED IN THE CONSTRUCTION OF THIS NOTE.   [name]:[id number] DOB: 1971-01-03 DOA: 11/17/2019  PCP: Pcp, No Patient coming from: home   I have personally briefly reviewed patient's old medical records in [place]  Chief Complaint: Right leg swelling  HPI: [name] is a 49 y.o. male with medical history significant for end-stage renal disease on hemodialysis on Tuesday, Thursday, Saturday schedule, chronic prednisone therapy in the setting of previous kidney transplant, chronic anemia with baseline hemoglobin 8-9, diverticulitis, GERD, who is admitted to [facility] on 11/17/2019 with right lower extremity cellulitis after presenting from home to [facility] complaining of right lower extremity swelling.   The patient had been seen in the [facility] emergency department on 11/10/2019 for an abrasion that he incurred on the anterior surface of the right lower extremity distal to the right knee.  At that time he received wound care along with wound care instructions following discharge to home from the emergency department.  Subsequently, over the last 2 to 3 days the patient notes progressive swelling, erythema, tenderness, and increased warmth of the right lower extremity around the area of the aforementioned abrasion.  He notes intermittent discharge from the site, and believes he has seen some purulent discharge.  Denies acute rash in any other location.  Denies any associated subjective fever, chills, rigors, or generalized myalgias  Denies any recent neck stiffness, rhinitis, rhinorrhea, sore throat, shortness of breath, cough, nausea, vomiting, abdominal pain, or diarrhea.  No recent traveling or known COVID-19 exposures.  Denies any recent chest pain, diaphoresis, palpitations.   Does not appear that the patient has any documented  history of peripheral vascular disease.  He does have a history of end-stage renal disease, for which she is on hemodialysis on a Tuesday, Thursday, Saturday schedule.  He completed his full, regularly scheduled hemodialysis session earlier today before presenting to the emergency department for further evaluation of the right lower extremity symptoms as above.     ED Course:  Vital signs in the ED were notable for the following: Temperature max 98.5; heart rate 70-73; blood pressure 85/40, which increased to 103/34 following interval IV fluid administration, respiratory rate 20-30, and oxygen saturation 95 to 96% on room air.  Labs were notable for the following: BMP notable for the following: Sodium 139, potassium 4.7, bicarbonate 27, anion gap 15.  CPK 136.  CBC notable for white blood cell count of 9800 with 90% neutrophils.  Hemoglobin with normocytic normochromic findings noted to be 8.9, which is relative to most recent prior documented hemoglobin value of 7.9 in June 2019, platelets 117.  INR 1.2.  Initial lactic acid 3.3, with repeat value trending down slightly to 3.2 following interval IV fluids, as further described low.  COVID-19 PCR as well as influenza PCR were checked in the ED this evening found to be negative.  Blood cultures x2 were collected prior to initiation of any antibiotics.  CT of the right tib-fib showed diffuse subcutaneous soft tissue swelling and edema consistent with cellulitis without obvious drainable abscess and without evidence of associated osteomyelitis.  While in the ED, the following were administered: Normal saline x2 L bolus, Rocephin, IV vancomycin x1, Dilaudid 1 mg IV x1.  Subsequently, the patient was admitted to [facility] for further evaluation and management of his presenting right lower extremity cellulitis.  Review of Systems: As per HPI otherwise 10 point review of systems negative.   Past Medical History:  Diagnosis Date   . Anemia   . Collagen vascular disease ([place])   . Lupus ([place])   . Renal disorder   . Renal insufficiency   . Renal transplant recipient     Past Surgical History:  Procedure Laterality Date  . NEPHRECTOMY TRANSPLANTED ORGAN      Social History:  reports that he has quit smoking. His smoking use included cigarettes. He has a 6.50 pack-year smoking history. He has never used smokeless tobacco. He reports current alcohol use. He reports that he does not use drugs.   Allergies  Allergen Reactions  . Ciprofloxacin Rash  . Sulfa Antibiotics Rash    Family History  Problem Relation Age of Onset  . Hypertension Other   . Brain cancer Mother        Died at age 69  . Aneurysm Mother   . CAD Father   . Heart attack Father 89     Prior to Admission medications   Medication Sig Start Date End Date Taking? Authorizing Provider  aspirin EC 81 MG tablet Take 81 mg by mouth daily.    [provider]  ferrous sulfate 324 (65 FE) MG TBEC Take 1 tablet (325 mg total) by mouth 2 (two) times daily. 12/10/14   Vaughan Basta, MD  HYDROcodone-acetaminophen (NORCO) 10-325 MG tablet Take 1 tablet by mouth every 8 (eight) hours as needed. Do Not Fill Before 11/21/2019 11/15/19   Bayard Hugger, NP  midodrine (PROAMATINE) 10 MG tablet Take 2 tablets by mouth every other day. 08/10/19   [provider]  pantoprazole (PROTONIX) 40 MG tablet Take 1 tablet (40 mg total) by mouth 2 (two) times daily. 12/10/14   Vaughan Basta, MD  predniSONE (DELTASONE) 10 MG tablet Take 10 mg by mouth daily. 11/02/19   [provider]  promethazine (PHENERGAN) 25 MG tablet Take 25 mg by mouth every 6 (six) hours as needed for nausea or vomiting.    [provider]  tamsulosin (FLOMAX) 0.4 MG CAPS capsule Take 0.8 mg by mouth as needed.     [provider]  traZODone (DESYREL) 50 MG tablet Take 50 mg by mouth at bedtime.    [provider]      Objective    Physical Exam: Vitals:   11/17/19 1321 11/17/19 1322  BP: (!) 85/40   Pulse: 72   Resp: (!) 30   Temp: 98.5 F (36.9 C)   TempSrc: Oral   SpO2: 90%   Weight:  95.3 kg  Height:  $Remove'6\' 2"'[id number]$  (1.88 m)    General: appears to be stated age; sleeping but awoke easily to verbal stimuli Skin: warm; right lower extremity erythema, swelling, and increased warmth in the proximity of the laceration to the anterior surface of the right lower extremity distal to the right knee Head:  AT/[place] Eyes:  PEARL b/l, EOMI Mouth:  Oral mucosa membranes appear dry, normal dentition Neck: supple; trachea midline Heart:  RRR; did not appreciate any M/R/G Lungs: CTAB, did not appreciate any wheezes, rales, or rhonchi Abdomen: + BS; soft, ND, NT Extremities: trace edema b/l LE's'; no muscle wasting; no evidence of crepitus Neuro: strength and sensation intact in upper and lower extremities b/l   Labs on Admission: I have personally reviewed following labs and imaging studies  CBC: Recent Labs  Lab 11/17/19 1405  WBC 9.8  NEUTROABS 8.9*  HGB 8.9*  HCT 28.4*  MCV 93.4  PLT 678*   Basic Metabolic Panel: Recent Labs  Lab 11/17/19 1405  NA 139  K 4.1  CL 97*  CO2 27  GLUCOSE 61*  BUN 18  CREATININE 4.17*  CALCIUM 9.5   GFR: Estimated Creatinine Clearance: 24.6 mL/min (A) (by C-G formula based on SCr of 4.17 mg/dL (H)). Liver Function Tests: Recent Labs  Lab 11/17/19 1405  AST 26  ALT 8  ALKPHOS 129*  BILITOT 1.6*  PROT 7.2  ALBUMIN 3.4*   No results for input(s): LIPASE, AMYLASE in the last 168 hours. No results for input(s): AMMONIA in the last 168 hours. Coagulation Profile: Recent Labs  Lab 11/17/19 1405  INR 1.2   Cardiac Enzymes: No results for input(s): CKTOTAL, CKMB, CKMBINDEX, TROPONINI in the last 168 hours. BNP (last 3 results) No results for input(s): PROBNP in the last 8760 hours. HbA1C: No results for input(s): HGBA1C in the last 72  hours. CBG: No results for input(s): GLUCAP in the last 168 hours. Lipid Profile: No results for input(s): CHOL, HDL, LDLCALC, TRIG, CHOLHDL, LDLDIRECT in the last 72 hours. Thyroid Function Tests: No results for input(s): TSH, T4TOTAL, FREET4, T3FREE, THYROIDAB in the last 72 hours. Anemia Panel: No results for input(s): VITAMINB12, FOLATE, FERRITIN, TIBC, IRON, RETICCTPCT in the last 72 hours. Urine analysis: No results found for: COLORURINE, APPEARANCEUR, LABSPEC, [place], GLUCOSEU, HGBUR, BILIRUBINUR, KETONESUR, PROTEINUR, UROBILINOGEN, NITRITE, LEUKOCYTESUR  Radiological Exams on Admission: CT Tibia Fibula Right Wo Contrast  Result Date: 11/17/2019 CLINICAL DATA:  Right lower extremity pain and swelling. History of laceration. EXAM: CT OF THE LOWER RIGHT EXTREMITY WITHOUT CONTRAST TECHNIQUE: Multidetector CT imaging of the right lower extremity was performed according to the standard protocol. COMPARISON:  None. FINDINGS: Diffuse and marked subcutaneous soft tissue swelling/edema/fluid suggesting severe cellulitis. I do not see a discrete drainable fluid collection to suggest a soft tissue abscess. No findings suspicious for myofasciitis or pyomyositis. No knee or ankle joint effusion is identified. Severe vascular disease for age. No CT findings suspicious for septic arthritis or osteomyelitis. IMPRESSION: 1. Diffuse and marked subcutaneous soft tissue swelling/edema/fluid suggesting severe cellulitis. No discrete drainable abscess. 2. No findings suspicious for myofasciitis or pyomyositis. 3. Severe vascular disease for age. 4. No findings suspicious for osteomyelitis or septic arthritis. Electronically Signed   By: Marijo Sanes M.D.   On: 11/17/2019 15:36     Assessment/Plan   [name] is a 49 y.o. male with medical history significant for end-stage renal disease on hemodialysis on Tuesday, Thursday, Saturday schedule, chronic prednisone therapy in the setting of previous kidney  transplant, chronic anemia with baseline hemoglobin 8-9, diverticulitis, GERD, who is admitted to [facility] on 11/17/2019 with right lower extremity cellulitis after presenting from home to [facility] complaining of right lower extremity swelling.    Principal Problem:   Cellulitis of right lower extremity Active Problems:   Lactic acidosis   Hypoglycemia   Anemia   GERD (gastroesophageal reflux disease)   ESRD (end stage renal disease) ([place])    #) Severe cellulitis of the right lower extremity: Diagnosis on the basis of right lower extremity area.  Erythema, tenderness, swelling, increased warmth to the touch over the last 2 to 3 days with CT of the right lower extremity showing evidence of subcutaneous edema consistent with cellulitis in the absence of any evidence of drainable abscess or osteomyelitis.  Additionally, no evidence of crepitus to suggest fasciitis  at this time.  The patient reports some potential purulent drainage from the site, rendering the possibility of MRSA as the underlying microbe.  Patient's cellulitis is in the proximity of the right lower extremity lack that was incurred on 11/10/2019 prompting presentation to the emergency department with ensuing wound care at that time.  It appears that this right lower extremity is the likely portal of entry predisposing the patient to ensuing development of right lower extremity cellulitis.  Right lower extremity neurovascularly intact.  Blood cultures x2 collected in the ED this evening, following which IV vancomycin and Rocephin were initiated.  Given the severe appearance of patient's cellulitis, including seen on today's CT of the right lower extremity, will continue broad-spectrum antibiotic with Rocephin as well as IV vancomycin, the latter of which for revision of MRSA given potential purulent discharge.  There are no SIRS criteria met at presentation, thereby patient's infection  does not meet criteria to be considered septic at this time, although he does have an elevated lactic acid, which stems from additional other metabolic contributing factors including that of his end-stage renal disease.  Of note, the patient's cellulitis meets criteria to be considered severe in nature based upon the patient's immunosuppression due to his chronic prednisone therapy.  Plan: IV Vanco and Rocephin, as above, will follow for results blood cultures x2 collected in the ED today.  Repeat CBC with differential in the morning I have placed a nursing communication order requesting that her distribution of erythema be outlined and that the affected extremity be elevated to help decrease associated swelling via gravity.  Also please see wound care consult.  As needed Tylenol.      #) Lactic acidosis: Presenting lactic acid found to be 3.3, which decreased to 3.2 following interval IV fluids.  While there may be a contribution to the elevated lactic acid infection of all the right lower extremity cellulitis, patient does not actually meet SIRS criteria to be considered septic.  I suspect that there are additional metabolic factors contributing to his elevated lactic acid level, including end-stage renal disease.  There may also be an element of starvation keto lactic acidosis given a relative fast since eating earlier today..  Of note presenting BMP demonstrates evidence of anion gap metabolic acidosis.  Plan: We will repeat lactic acid level at 2000.  Unless ensuing lactate begins to trend back will likely refrain from additional IV fluids due to suspected contributions from noninfectious etiologies.  Repeat BMP in the morning.  Check INR to further monitor synthetic hepatic function.     #) Hypoglycemia: Blood sugar noted to be 70, at which time the patient was slightly somnolent but arousable.  Does not appear that he is on any insulin or hypoglycemic inducing diabetic agents at home.  Plan:  Amp of D50 x1 now.  I also placed an order for as needed D50 moving forward should blood sugar decrease less than 75.  Accu-Cheks every hour x3.  Afterwards I ordered Accu-Cheks on a before every meal and at bedtime basis without sliding scale insulin.     #) End-stage renal disease: On hemodialysis on a Tuesday, Thursday, Saturday schedule.  He attended his regularly scheduled hemodialysis session earlier today, at which time there was a reports of 2.5 L of fluid removed.  Should the patient remain in the hospital Thursday approaches, will consult nephrology to arrange for provision regularly scheduled hemodialysis for Thursday.  Plan: Monitor strict I's and O's and daily weights.  Repeat BMP  in the morning.  Check serum magnesium and phosphorus levels.     #) Chronic anemia: Suspect the patient's chronic anemia within the range of 8-9 is multifactorial in origin, with contribution from anemia of chronic kidney disease as well as a documented history of chronic iron deficiency anemia for which the patient daily oral iron supplementation at home.  Presenting labs reflect hemoglobin of 8.9, which is within baseline range.  No evidence of active bleed at this time  Plan: Continue home daily oral iron supplementation.  Repeat CBC in the morning.  Check INR.     #) GERD: On Protonix twice daily at home.  Plan: Continue Protonix twice daily.   DVT prophylaxis: Heparin 5000 units subcu daily Code Status: Full code Family Communication: none Disposition Plan: Per Rounding Team Consults called: none  Admission status: Inpatient; pcu (in setting of elevated LA)    PLEASE NOTE THAT DRAGON DICTATION SOFTWARE WAS USED IN THE CONSTRUCTION OF THIS NOTE.   [facility] Pager [phone] From 12PM- 12AM  Otherwise, please contact night-coverage  [email or web address] Password [email or web address]  11/17/2019, 3:41 PM

## 2019-11-17 NOTE — Progress Notes (Signed)
Notified bedside nurse of need to draw blood cultures.  

## 2019-11-17 NOTE — ED Provider Notes (Signed)
I assumed care of this patient approximately 1530.  Please see outgoing providers note for full details regarding patient's initial evaluation assessment.  In brief patient presents for assessment of worsening pain redness and swelling around the site of wound he sustained 5 days ago when he struck his leg against a car door.  The wound was repaired with sutures in this emergency room.  Patient is an ESRD patient and did get dialysis today.  Patient is hypertensive on arrival with a BP of 85/40 and tachypneic with respiratory of 30.  Code sepsis initiated on arrival.  In addition CT ordered to assess for evidence of subcutaneous gas less concern for possible drug testing infection versus sepsis from cellulitis.  Sutures were removed by myself.  .Suture Removal  Date/Time: 11/17/2019 3:42 PM Performed by: Gilles Chiquito, MD Authorized by: Gilles Chiquito, MD   Consent:    Consent obtained:  Verbal   Consent given by:  Patient   Risks discussed:  Pain Location:    Location:  Lower extremity   Lower extremity location:  Leg   Leg location:  R lower leg Procedure details:    Wound appearance:  Draining, purulent, tender, warm, red and indurated   Number of sutures removed:  6 Post-procedure details:    Patient tolerance of procedure:  Tolerated well, no immediate complications  .1-3 Lead EKG Interpretation Performed by: Gilles Chiquito, MD Authorized by: Gilles Chiquito, MD     Interpretation: normal     ECG rate assessment: normal     Rhythm: sinus rhythm     Ectopy: none     Conduction: normal     CT R Tib/Fib remarkable for: 1. Diffuse and marked subcutaneous soft tissue swelling/edema/fluid suggesting severe cellulitis. No discrete drainable abscess. 2. No findings suspicious for myofasciitis or pyomyositis. 3. Severe vascular disease for age. 4. No findings suspicious for osteomyelitis or septic arthritis.  Initial labs remarkable for a lactate of 3.3, CMP remarkable for  glucose of 61, creatinine of 4.17, and EGFR of 17.  No other significant electrolyte or metabolic derangements.  This creatinine is consistent with patient's history of ESRD and recent dialysis today.  CBC remarkable for hemoglobin of 8.9 which is compared to 7.92 years ago.  WBC is unremarkable and platelets are slightly low at 117.  After patient received initial fluid boluses on my reassessment patient stated he felt a little better.  His blood pressure had increased to 103/34.  In addition his last check was noted to be trending down to 3.2.  Admitted to hospitalist service in critical condition for further evaluation management of sepsis secondary to cellulitis.  . Medications  sodium chloride 0.9 % bolus 1,000 mL (1,000 mLs Intravenous New Bag/Given 11/17/19 1440)    And  sodium chloride 0.9 % bolus 1,000 mL (1,000 mLs Intravenous New Bag/Given 11/17/19 1446)    And  sodium chloride 0.9 % bolus 1,000 mL (has no administration in time range)  vancomycin (VANCOCIN) IVPB 1000 mg/200 mL premix (1,000 mg Intravenous New Bag/Given 11/17/19 1451)  sodium chloride flush (NS) 0.9 % injection 3 mL (has no administration in time range)  sodium chloride flush (NS) 0.9 % injection 3 mL (has no administration in time range)  0.9 %  sodium chloride infusion (has no administration in time range)  Tdap (BOOSTRIX) injection 0.5 mL (has no administration in time range)  cefTRIAXone (ROCEPHIN) 2 g in sodium chloride 0.9 % 100 mL IVPB (2 g Intravenous New Bag/Given  11/17/19 1443)  HYDROmorphone (DILAUDID) injection 1 mg (1 mg Intravenous Given 11/17/19 1442)    Patient will be admitted to medicine service for further evaluation management.   Lucrezia Starch, MD 11/17/19 [phone]

## 2019-11-17 NOTE — Progress Notes (Signed)
PHARMACY -  BRIEF ANTIBIOTIC NOTE   Pharmacy has received consult(s) for Cefepime and vancomycin  from an ED provider.  The patient's profile has been reviewed for ht/wt/allergies/indication/available labs.    Patient has PMH of ESRD. Last HD this morning.   According to RN patient received vancomycin 1000mg  and ceftazidime prior to hospital admission.   One time order(s) placed for vancomycin 1000mg  (total loading dose of 2000mg )   Further antibiotics/pharmacy consults should be ordered by admitting physician if indicated.                       Thank you, Pernell Dupre, PharmD, BCPS Clinical Pharmacist 11/17/2019 2:17 PM

## 2019-11-17 NOTE — Progress Notes (Signed)
Notified bedside nurse of need to draw repeat lactic acid, fluid needs 2859 cc, has had 2300cc.

## 2019-11-17 NOTE — Consult Note (Signed)
NAME:  [name], MRN:  [id number], DOB:  18-Nov-1970, LOS: 0 ADMISSION DATE:  11/17/2019, CONSULTATION DATE:  11/17/2019  REFERRING MD:  Rufina Falco, NP, CHIEF COMPLAINT:  Right Leg Swelling  Brief History   49 y.o. Male with a PMH of ESRD on HD admitted 11/17/19 with Septic Shock in the setting of Right Lower Extremity Cellulitis.  History of present illness   [name] is a 49 year old male with a past medical history significant for end-stage renal disease on hemodialysis (Tuesday, Thursday, Saturday), chronic prednisone therapy due to previous kidney transplant, chronic anemia, diverticulitis, GERD who presents to [facility] ED on 11/17/2019 due to right lower extremity swelling and pain.  Of note he was recently seen at [facility] ED on 11/10/2019 due to an abrasion to the anterior surface of the right lower extremity distal to the right knee.  At that time he received wound care and was discharged home.  Over the last 2 to 3 days he has noted progressive swelling, erythema, tenderness, increased warmth of the right lower extremity surrounding the previous abrasion.  He also notes intermittent purulent discharge from the site.  He denies any associated fever, chills, rigors, chest pain, shortness of breath, abdominal pain, nausea, vomiting, diarrhea.  Upon presentation to the ED Vitas were notable for the following, temperature 98.5, heart rate 73, blood pressure 85/40 (increased to 103/34 following IV fluids), respiratory rate 20-30, and O2 saturations 95% on room air.  Labs were notable for sodium 139, potassium 4.7, bicarb 27, anion gap 15, CPK 136, WBC 9.8 with neutrophilia, hemoglobin 8.9, platelets 117, INR 1.2, lactic acid 3.3.  His COVID-19 PCR and influenza PCR were both negative.  CT of the right lower extremity showed evidence of subcutaneous edema consistent with cellulitis in the absence of any evidence of drainable abscess or osteomyelitis.  He met sepsis criteria therefore he  received IV fluid resuscitation (2 L), IV Rocephin and vancomycin.  He was to be admitted to the stepdown unit by the hospitalist.  While awaiting bed placement in the ED, he became further hypotensive with worsening lactic acidosis, which was unresponsive to 250 cc bolus. He ultimately required Levophed infusion.  PCCM is consulted for further work-up and treatment of septic shock secondary to severe right lower extremity cellulitis.  Past Medical History  ESRD on HD (T, Th, S) Kidney Transplant on chronic Prednisone Lupus Collagen Vascular Disease Anemia (baseline Hgb 8 to 9) Diverticulitis GERD  Significant Hospital Events   10/28: Presented to ED; became hypotensive requiring Levophed; PCCM consulted  10/29: Trialysis catheter placed for anticipation pt may need CRRT (unable to tolerate HD with pressor requirements)  Consults:  Hospitalist (primary service) PCCM Nephrology Wound Care  Procedures:  10/29: Left femoral trialysis catheter placed  Significant Diagnostic Tests:  10/28: CT Right Fibula/Tibia>>1. Diffuse and marked subcutaneous soft tissue swelling/edema/fluid suggesting severe cellulitis. No discrete drainable abscess. 2. No findings suspicious for myofasciitis or pyomyositis. 3. Severe vascular disease for age. 4. No findings suspicious for osteomyelitis or septic arthritis. 10/28: CXR>>1. Diffuse chronic appearing increased interstitial lung markings. A small superimposed component of interstitial edema cannot be excluded. 2. Mild bibasilar atelectasis. 3. Small right pleural effusion.  Micro Data:  10/28: SARS-CoV-2 PCR>>negative 10/28: Influenza PCR>>negative 10/28: Blood culture x2>> 10/28: Wound culture of RLE>> 10/28: Urine>>  Antimicrobials:  Vancomycin 10/28>> Clindamycin 10/28>> Cefepime 10/28>> Ceftriaxone x1 dose on 10/28  Interim history/subjective:  Pt reports pain to RLE, otherwise denies all complaints On room air Currently  on 2 mcg  Levophed which is being titrated up to 5 mcg to maintain MAP >65 Only 1 PIV in place, will likely need central venous access   Objective   Blood pressure (!) 96/50, pulse 79, temperature 98.4 F (36.9 C), resp. rate 16, height _0  (1.88 m), weight 95.3 kg, SpO2 95 %.        Intake/Output Summary (Last 24 hours) at 11/17/2019 2211 Last data filed at 11/17/2019 6945 Gross per 24 hour  Intake 2300 ml  Output --  Net 2300 ml   Filed Weights   11/17/19 1322  Weight: 95.3 kg    Examination: General: Acute on chronically ill appearing male, sleeping in bed, on room air, complaining of RLE pain when awoken HENT: Atraumatic, normocephalic, neck supple Lungs: Clear to auscultation bilaterally, no wheezing or rales, even, nonlabored Cardiovascular: Regular rate and rhythm, s1s2, no M/R/G Abdomen: Obese, soft, nontender, nondistended, no guarding or rebound tenderness, BS+ x4 Extremities: Trace edema b/l LE's, no deformities Neuro: Sleeping, arouses to voice and follows commands, no focal deficits, speech clear Skin: Cellulitis and wound to RLE      Resolved Hospital Problem list   N/A  Assessment & Plan:   Septic Shock -Continous cardiac monitoring -Maintain MAP >65 -Cautious IVF given ESRD (previously received IVF resuscitation in ED) -Levophed as needed to maintain MAP goal -Continue home Midodrine -Check serum Cortisol  -Trend lactic acid   Severe Sepsis in the setting of Severe Cellulitis of the Right Lower Extremity -Monitor fever curve -Trend WBC's & Procalcitonin -Follow pan cultures as above -Continue Vancomycin & Clindamycin, will change Rocephin to Cefepime for now -CT of the RLE on 11/17/19 showing evidence of subcutaneous edema consistent with cellulitis in the absence of any evidence of drainable abscess or osteomyelitis -Wound care consulted, appreciate input   ESRD on HD Anion Gap Metabolic Acidosis in the setting of Lactic acidosis  -Monitor I&O's /  urinary output -Follow BMP -Ensure adequate renal perfusion -Avoid nephrotoxic agents as able -Replace electrolytes as indicated -Consult Nephrology, appreciate input ~ HD vs. CRRT as per Nephrology -Will place Trialysis catheter in anticipation pt may not tolerate HD given vasopressor requirements   Anemia of Chronic Disease Thrombocytopenia, likely in setting of Sepsis -Monitor for S/Sx of bleeding -Trend CBC -Heparin SQ for VTE Prophylaxis  -Transfuse for Hgb <7   Hypoglycemia -CBG's -Follow ICU Hypo/Hyperglycemia protocol     Best practice:  Diet: Renal diet Pain/Anxiety/Delirium protocol (if indicated): Dilaudid VAP protocol (if indicated): N/A DVT prophylaxis: Heparin SQ GI prophylaxis: N/A Glucose control: N/A Mobility: As tolerated Code Status: Full Code Family Communication: Updated pt at bedside 11/17/2019 (no family present) Disposition: ICU  Labs   CBC: Recent Labs  Lab 11/17/19 1405  WBC 9.8  NEUTROABS 8.9*  HGB 8.9*  HCT 28.4*  MCV 93.4  PLT 117*    Basic Metabolic Panel: Recent Labs  Lab 11/17/19 1405 11/17/19 1534  NA 139  --   K 4.1  --   CL 97*  --   CO2 27  --   GLUCOSE 61*  --   BUN 18  --   CREATININE 4.17*  --   CALCIUM 9.5  --   MG  --  2.1   GFR: Estimated Creatinine Clearance: 24.6 mL/min (A) (by C-G formula based on SCr of 4.17 mg/dL (H)). Recent Labs  Lab 11/17/19 1405 11/17/19 2000  WBC 9.8  --   LATICACIDVEN 3.2*  3.3* 3.6*    Liver  Function Tests: Recent Labs  Lab 11/17/19 1405  AST 26  ALT 8  ALKPHOS 129*  BILITOT 1.6*  PROT 7.2  ALBUMIN 3.4*   No results for input(s): LIPASE, AMYLASE in the last 168 hours. No results for input(s): AMMONIA in the last 168 hours.  ABG No results found for: PHART, PCO2ART, PO2ART, HCO3, TCO2, ACIDBASEDEF, O2SAT   Coagulation Profile: Recent Labs  Lab 11/17/19 1405  INR 1.2    Cardiac Enzymes: Recent Labs  Lab 11/17/19 1405  CKTOTAL 136    HbA1C: Hgb  A1c MFr Bld  Date/Time Value Ref Range Status  12/08/2014 07:50 PM  UNABLE TO REPORT DUE TO LOW HEMOGLOBIN 4.0 - 6.0 % Final    CBG: Recent Labs  Lab 11/17/19 1709 11/17/19 2001 11/17/19 2044  GLUCAP 70 46* 93    Review of Systems:   Positives in BOLD: Gen: Denies fever, chills, weight change, fatigue, night sweats HEENT: Denies blurred vision, double vision, hearing loss, tinnitus, sinus congestion, rhinorrhea, sore throat, neck stiffness, dysphagia PULM: Denies shortness of breath, cough, sputum production, hemoptysis, wheezing CV: Denies chest pain, edema, orthopnea, paroxysmal nocturnal dyspnea, palpitations GI: Denies abdominal pain, nausea, vomiting, diarrhea, hematochezia, melena, constipation, change in bowel habits GU: Denies dysuria, hematuria, polyuria, oliguria, urethral discharge Endocrine: Denies hot or cold intolerance, polyuria, polyphagia or appetite change Derm: Denies rash, dry skin, scaling or peeling skin change Heme: Denies easy bruising, bleeding, bleeding gums Neuro: Denies headache, numbness, weakness, slurred speech, loss of memory or consciousness Musculoskeletal: Reports RLE pain  Past Medical History  He,  has a past medical history of Anemia, Collagen vascular disease ([place]), Lupus ([place]), Renal disorder, Renal insufficiency, and Renal transplant recipient.   Surgical History    Past Surgical History:  Procedure Laterality Date  . NEPHRECTOMY TRANSPLANTED ORGAN       Social History   reports that he has quit smoking. His smoking use included cigarettes. He has a 6.50 pack-year smoking history. He has never used smokeless tobacco. He reports current alcohol use. He reports that he does not use drugs.   Family History   His family history includes Aneurysm in his mother; Brain cancer in his mother; CAD in his father; Heart attack (age of onset: 89) in his father; Hypertension in an other family member.   Allergies Allergies  Allergen Reactions   . Ciprofloxacin Rash  . Sulfa Antibiotics Rash     Home Medications  Prior to Admission medications   Medication Sig Start Date End Date Taking? Authorizing Provider  aspirin EC 81 MG tablet Take 81 mg by mouth daily.    [provider]  ferrous sulfate 324 (65 FE) MG TBEC Take 1 tablet (325 mg total) by mouth 2 (two) times daily. 12/10/14   Vaughan Basta, MD  HYDROcodone-acetaminophen (NORCO) 10-325 MG tablet Take 1 tablet by mouth every 8 (eight) hours as needed. Do Not Fill Before 11/21/2019 11/15/19   Bayard Hugger, NP  midodrine (PROAMATINE) 10 MG tablet Take 2 tablets by mouth every other day. 08/10/19   [provider]  pantoprazole (PROTONIX) 40 MG tablet Take 1 tablet (40 mg total) by mouth 2 (two) times daily. 12/10/14   Vaughan Basta, MD  predniSONE (DELTASONE) 10 MG tablet Take 10 mg by mouth daily. 11/02/19   [provider]  promethazine (PHENERGAN) 25 MG tablet Take 25 mg by mouth every 6 (six) hours as needed for nausea or vomiting.    [provider]  tamsulosin (FLOMAX) 0.4  MG CAPS capsule Take 0.8 mg by mouth as needed.     [provider]  traZODone (DESYREL) 50 MG tablet Take 50 mg by mouth at bedtime.    [provider]     Critical care time: 50 minutes     Darel Hong, [facility] Pager: [phone]

## 2019-11-17 NOTE — ED Notes (Signed)
Report given to receiving ICU RN. Pt transported on ED liter with RN on monitor at this time.

## 2019-11-17 NOTE — Progress Notes (Signed)
CODE SEPSIS - PHARMACY COMMUNICATION  **Broad Spectrum Antibiotics should be administered within 1 hour of Sepsis diagnosis**  Time Code Sepsis Called/Page Received: @ 1403  Antibiotics Ordered: Vancomycin and ceftriaxone   Time of 1st antibiotic administration: @ 1443  Additional action taken by pharmacy: Confirmed with RN that patient received Vancomycin 1000mg  and ceftazidime prior to admisison.    Pernell Dupre, PharmD, BCPS Clinical Pharmacist 11/17/2019 2:18 PM

## 2019-11-17 NOTE — ED Provider Notes (Signed)
[facility] Emergency Department Provider Note  ____________________________________________  Time seen: Approximately 3:26 PM  I have reviewed the triage vital signs and the nursing notes.   HISTORY  Chief Complaint Wound Check    HPI [name] is a 49 y.o. male with a history of end-stage renal disease on hemodialysis, lupus who comes the ED complaining of pain and swelling of the right leg in an area where he had a wound 5 days ago.  He was seen in the ED, had wound care and sutures done, but the wound has become worse over the past several days.  He was seen in dialysis, give some IV antibiotics today.  Tetanus is reportedly up-to-date.   Symptoms are constant and severe, nonradiating, worse with movement.     Past Medical History:  Diagnosis Date  . Anemia   . Collagen vascular disease ([place])   . Lupus ([place])   . Renal disorder   . Renal insufficiency   . Renal transplant recipient      Patient Active Problem List   Diagnosis Date Noted  . Murmur 03/25/2018  . Laboratory examination 03/25/2018  . Tendonitis of both rotator cuffs 12/29/2017  . Greater trochanteric bursitis of both hips 12/29/2017  . Diverticulitis of large intestine without perforation or abscess without bleeding   . Diverticulitis 07/01/2017  . 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 At risk for sepsis 09/08/2015  . GI bleed 12/09/2014     Past Surgical History:  Procedure Laterality Date  . NEPHRECTOMY TRANSPLANTED ORGAN       Prior to Admission medications   Medication Sig Start Date End Date Taking? Authorizing Provider  aspirin EC 81 MG tablet Take 81 mg by mouth daily.    [provider]  ferrous sulfate 324 (65 FE) MG TBEC Take 1 tablet (325 mg total) by mouth 2 (two) times daily. 12/10/14   Vaughan Basta, MD  HYDROcodone-acetaminophen (NORCO) 10-325 MG tablet Take 1 tablet by mouth every 8  (eight) hours as needed. Do Not Fill Before 11/21/2019 11/15/19   Bayard Hugger, NP  midodrine (PROAMATINE) 10 MG tablet Take 2 tablets by mouth every other day. 08/10/19   [provider]  pantoprazole (PROTONIX) 40 MG tablet Take 1 tablet (40 mg total) by mouth 2 (two) times daily. 12/10/14   Vaughan Basta, MD  predniSONE (DELTASONE) 10 MG tablet Take 10 mg by mouth daily. 11/02/19   [provider]  promethazine (PHENERGAN) 25 MG tablet Take 25 mg by mouth every 6 (six) hours as needed for nausea or vomiting.    [provider]  tamsulosin (FLOMAX) 0.4 MG CAPS capsule Take 0.8 mg by mouth as needed.     [provider]  traZODone (DESYREL) 50 MG tablet Take 50 mg by mouth at bedtime.    [provider]     Allergies Ciprofloxacin and Sulfa antibiotics   Family History  Problem Relation Age of Onset  . Hypertension Other   . Brain cancer Mother        Died at age 80  . Aneurysm Mother   . CAD Father   . Heart attack Father 64    Social History Social History   Tobacco Use  . Smoking status: Former Smoker    Packs/day: 0.50    Years: 13.00    Pack years: 6.50    Types: Cigarettes  . Smokeless tobacco: Never Used  Vaping Use  .  Vaping Use: Never used  Substance Use Topics  . Alcohol use: Yes    Comment: Occasional  . Drug use: No    Review of Systems  Constitutional:   No fever or chills.  ENT:   No sore throat. No rhinorrhea. Cardiovascular:   No chest pain or syncope. Respiratory:   No dyspnea or cough. Gastrointestinal:   Negative for abdominal pain, vomiting and diarrhea.  Musculoskeletal:   Right leg pain and swelling as above All other systems reviewed and are negative except as documented above in ROS and HPI.  ____________________________________________   PHYSICAL EXAM:  VITAL SIGNS: ED Triage Vitals  Enc Vitals Group     BP 11/17/19 1321 (!) 85/40     Pulse Rate 11/17/19 1321 72     Resp  11/17/19 1321 (!) 30     Temp 11/17/19 1321 98.5 F (36.9 C)     Temp Source 11/17/19 1321 Oral     SpO2 11/17/19 1321 90 %     Weight 11/17/19 1322 210 lb (95.3 kg)     Height 11/17/19 1322 6\' 2"  (1.88 m)     Head Circumference --      Peak Flow --      Pain Score 11/17/19 1321 9     Pain Loc --      Pain Edu? --      Excl. in [place]? --     Vital signs reviewed, nursing assessments reviewed.   Constitutional:   Alert and oriented.  Ill-appearing. Eyes:   Conjunctivae are normal. EOMI. PERRL. ENT      Head:   Normocephalic and atraumatic.      Nose:   Wearing a mask.      Mouth/Throat:   Wearing a mask.      Neck:   No meningismus. Full ROM. Hematological/Lymphatic/Immunilogical:   No cervical lymphadenopathy. Cardiovascular:   RRR. Symmetric bilateral radial and DP pulses.  No murmurs. Cap refill less than 2 seconds. Respiratory:   Tachypnea, normal work of breathing.  No crackles or wheezes.  Symmetric air movement. Gastrointestinal:   Soft and nontender. Non distended. There is no CVA tenderness.  No rebound, rigidity, or guarding.  Musculoskeletal:   Normal range of motion in all extremities.  1+ edema left lower extremity, 2+ edema right lower extremity.  There is warmth erythema and exquisite tenderness of the right lower extremity around a 4 cm purulent triangular-shaped wound with sutures in place.  No crepitus.. Neurologic:   Normal speech and language.  Motor grossly intact. No acute focal neurologic deficits are appreciated.  Skin:    Skin is warm, dry with right leg wound as above, otherwise intact.  There are scattered skin ulcers which the patient attributes to his lupus.  No rash noted.  No petechiae, purpura, or bullae.  ____________________________________________    LABS (pertinent positives/negatives) (all labs ordered are listed, but only abnormal results are displayed) Labs Reviewed  LACTIC ACID, PLASMA - Abnormal; Notable for the following components:       Result Value   Lactic Acid, Venous 3.3 (*)    All other components within normal limits  COMPREHENSIVE METABOLIC PANEL - Abnormal; Notable for the following components:   Chloride 97 (*)    Glucose, Bld 61 (*)    Creatinine, Ser 4.17 (*)    Albumin 3.4 (*)    Alkaline Phosphatase 129 (*)    Total Bilirubin 1.6 (*)    GFR, Estimated 17 (*)    All  other components within normal limits  CBC WITH DIFFERENTIAL/PLATELET - Abnormal; Notable for the following components:   RBC 3.04 (*)    Hemoglobin 8.9 (*)    HCT 28.4 (*)    RDW 16.7 (*)    Platelets 117 (*)    Neutro Abs 8.9 (*)    Lymphs Abs 0.5 (*)    All other components within normal limits  APTT - Abnormal; Notable for the following components:   aPTT 39 (*)    All other components within normal limits  CULTURE, BLOOD (ROUTINE X 2)  CULTURE, BLOOD (ROUTINE X 2)  URINE CULTURE  RESPIRATORY PANEL BY RT PCR (FLU A&B, COVID)  AEROBIC CULTURE (SUPERFICIAL SPECIMEN)  PROTIME-INR  LACTIC ACID, PLASMA  URINALYSIS, COMPLETE (UACMP) WITH MICROSCOPIC  CK  C-REACTIVE PROTEIN  CBG MONITORING, ED  CBG MONITORING, ED  CBG MONITORING, ED   ____________________________________________   EKG    ____________________________________________    RADIOLOGY  No results found.  ____________________________________________   PROCEDURES .Critical Care Performed by: Carrie Mew, MD Authorized by: Carrie Mew, MD   Critical care provider statement:    Critical care time (minutes):  35   Critical care time was exclusive of:  Separately billable procedures and treating other patients   Critical care was necessary to treat or prevent imminent or life-threatening deterioration of the following conditions:  Sepsis and shock   Critical care was time spent personally by me on the following activities:  Development of treatment plan with patient or surrogate, discussions with consultants, evaluation of patient's response to  treatment, examination of patient, obtaining history from patient or surrogate, ordering and performing treatments and interventions, ordering and review of laboratory studies, ordering and review of radiographic studies, pulse oximetry, re-evaluation of patient's condition and review of old charts    ____________________________________________  DIFFERENTIAL DIAGNOSIS   Cellulitis, abscess, necrotizing fasciitis, sepsis, dehydration  CLINICAL IMPRESSION / ASSESSMENT AND PLAN / ED COURSE  Medications ordered in the ED: Medications  sodium chloride 0.9 % bolus 1,000 mL (1,000 mLs Intravenous New Bag/Given 11/17/19 1440)    And  sodium chloride 0.9 % bolus 1,000 mL (1,000 mLs Intravenous New Bag/Given 11/17/19 1446)    And  sodium chloride 0.9 % bolus 1,000 mL (has no administration in time range)  vancomycin (VANCOCIN) IVPB 1000 mg/200 mL premix (1,000 mg Intravenous New Bag/Given 11/17/19 1451)  sodium chloride flush (NS) 0.9 % injection 3 mL (has no administration in time range)  sodium chloride flush (NS) 0.9 % injection 3 mL (has no administration in time range)  0.9 %  sodium chloride infusion (has no administration in time range)  Tdap (BOOSTRIX) injection 0.5 mL (has no administration in time range)  cefTRIAXone (ROCEPHIN) 2 g in sodium chloride 0.9 % 100 mL IVPB (2 g Intravenous New Bag/Given 11/17/19 1443)  HYDROmorphone (DILAUDID) injection 1 mg (1 mg Intravenous Given 11/17/19 1442)    Pertinent labs & imaging results that were available during my care of the patient were reviewed by me and considered in my medical decision making (see chart for details).  ARYA BOXLEY was evaluated in Emergency Department on 11/17/2019 for the symptoms described in the history of present illness. He was evaluated in the context of the global COVID-19 pandemic, which necessitated consideration that the patient might be at risk for infection with the SARS-CoV-2 virus that causes COVID-19.  Institutional protocols and algorithms that pertain to the evaluation of patients at risk for COVID-19 are in a state of  rapid change based on information released by regulatory bodies including the CDC and federal and state organizations. These policies and algorithms were followed during the patient's care in the ED.     Clinical Course as of Nov 16 1533  Thu Nov 17, 2019  1402 Patient presents with purulent wound on the right leg, obvious infection with cellulitis. Will obtain a CT scan of the leg as well to further evaluate. He presents with hypotension and tachypnea concerning for sepsis with distributive shock. Will start broad-spectrum IV antibiotics, give 30 mL/kg fluid resuscitation, plan to admit for further management.   [PS]    Clinical Course User Index [PS] Carrie Mew, MD     ----------------------------------------- 3:35 PM on 11/17/2019 -----------------------------------------  Patient in CT.  We will plan to remove sutures when he returns.  ____________________________________________   FINAL CLINICAL IMPRESSION(S) / ED DIAGNOSES    Final diagnoses:  Cellulitis of right lower extremity  ESRD on hemodialysis [facility])     ED Discharge Orders    None      Portions of this note were generated with dragon dictation software. Dictation errors may occur despite best attempts at proofreading.   Carrie Mew, MD 11/17/19 1535

## 2019-11-17 NOTE — ED Triage Notes (Signed)
Pt comes into the ED via [facility] from home c/o pain and swelling in his right leg where he has a wound.  Pt cut his leg on 10/21, got stitches, and since then has had increased swelling, clear drainage, and heat from the leg.  Pt is a dialysis patient was given 1g vancomycin and 1 gram ceftazidime today.  Pt states the pain is a stabbing pain in his leg.  Pt completed a full dialysis this morning.

## 2019-11-18 DIAGNOSIS — L03115 Cellulitis of right lower limb: Secondary | ICD-10-CM | POA: Diagnosis not present

## 2019-11-18 LAB — GLUCOSE, CAPILLARY
Glucose-Capillary: 109 mg/dL — ABNORMAL HIGH (ref 70–99)
Glucose-Capillary: 129 mg/dL — ABNORMAL HIGH (ref 70–99)
Glucose-Capillary: 142 mg/dL — ABNORMAL HIGH (ref 70–99)
Glucose-Capillary: 43 mg/dL — CL (ref 70–99)
Glucose-Capillary: 46 mg/dL — ABNORMAL LOW (ref 70–99)
Glucose-Capillary: 50 mg/dL — ABNORMAL LOW (ref 70–99)
Glucose-Capillary: 55 mg/dL — ABNORMAL LOW (ref 70–99)
Glucose-Capillary: 67 mg/dL — ABNORMAL LOW (ref 70–99)
Glucose-Capillary: 67 mg/dL — ABNORMAL LOW (ref 70–99)
Glucose-Capillary: 69 mg/dL — ABNORMAL LOW (ref 70–99)
Glucose-Capillary: 71 mg/dL (ref 70–99)
Glucose-Capillary: 73 mg/dL (ref 70–99)
Glucose-Capillary: 74 mg/dL (ref 70–99)
Glucose-Capillary: 80 mg/dL (ref 70–99)
Glucose-Capillary: 82 mg/dL (ref 70–99)
Glucose-Capillary: 82 mg/dL (ref 70–99)
Glucose-Capillary: 83 mg/dL (ref 70–99)
Glucose-Capillary: 86 mg/dL (ref 70–99)
Glucose-Capillary: 88 mg/dL (ref 70–99)
Glucose-Capillary: 95 mg/dL (ref 70–99)
Glucose-Capillary: 98 mg/dL (ref 70–99)

## 2019-11-18 LAB — BASIC METABOLIC PANEL
Anion gap: 8 (ref 5–15)
BUN: 31 mg/dL — ABNORMAL HIGH (ref 6–20)
CO2: 27 mmol/L (ref 22–32)
Calcium: 8.3 mg/dL — ABNORMAL LOW (ref 8.9–10.3)
Chloride: 100 mmol/L (ref 98–111)
Creatinine, Ser: 5.32 mg/dL — ABNORMAL HIGH (ref 0.61–1.24)
GFR, Estimated: 12 mL/min — ABNORMAL LOW (ref >60)
Glucose, Bld: 91 mg/dL (ref 70–99)
Potassium: 4.5 mmol/L (ref 3.5–5.1)
Sodium: 135 mmol/L (ref 135–145)

## 2019-11-18 LAB — COMPREHENSIVE METABOLIC PANEL
ALT: 10 U/L (ref 0–44)
AST: 17 U/L (ref 15–41)
Albumin: 2.8 g/dL — ABNORMAL LOW (ref 3.5–5.0)
Alkaline Phosphatase: 117 U/L (ref 38–126)
Anion gap: 11 (ref 5–15)
BUN: 25 mg/dL — ABNORMAL HIGH (ref 6–20)
CO2: 25 mmol/L (ref 22–32)
Calcium: 8.9 mg/dL (ref 8.9–10.3)
Chloride: 100 mmol/L (ref 98–111)
Creatinine, Ser: 4.8 mg/dL — ABNORMAL HIGH (ref 0.61–1.24)
GFR, Estimated: 14 mL/min — ABNORMAL LOW (ref >60)
Glucose, Bld: 61 mg/dL — ABNORMAL LOW (ref 70–99)
Potassium: 4.5 mmol/L (ref 3.5–5.1)
Sodium: 136 mmol/L (ref 135–145)
Total Bilirubin: 2.1 mg/dL — ABNORMAL HIGH (ref 0.3–1.2)
Total Protein: 6.1 g/dL — ABNORMAL LOW (ref 6.5–8.1)

## 2019-11-18 LAB — CBC WITH DIFFERENTIAL/PLATELET
Abs Immature Granulocytes: 0.36 10*3/uL — ABNORMAL HIGH (ref 0.00–0.07)
Basophils Absolute: 0.1 10*3/uL (ref 0.0–0.1)
Basophils Relative: 0 %
Eosinophils Absolute: 0.1 10*3/uL (ref 0.0–0.5)
Eosinophils Relative: 1 %
HCT: 26.6 % — ABNORMAL LOW (ref 39.0–52.0)
Hemoglobin: 8.3 g/dL — ABNORMAL LOW (ref 13.0–17.0)
Immature Granulocytes: 2 %
Lymphocytes Relative: 7 %
Lymphs Abs: 1.2 10*3/uL (ref 0.7–4.0)
MCH: 29.1 pg (ref 26.0–34.0)
MCHC: 31.2 g/dL (ref 30.0–36.0)
MCV: 93.3 fL (ref 80.0–100.0)
Monocytes Absolute: 1 10*3/uL (ref 0.1–1.0)
Monocytes Relative: 5 %
Neutro Abs: 15.1 10*3/uL — ABNORMAL HIGH (ref 1.7–7.7)
Neutrophils Relative %: 85 %
Platelets: 99 10*3/uL — ABNORMAL LOW (ref 150–400)
RBC: 2.85 MIL/uL — ABNORMAL LOW (ref 4.22–5.81)
RDW: 16.7 % — ABNORMAL HIGH (ref 11.5–15.5)
Smear Review: NORMAL
WBC: 17.8 10*3/uL — ABNORMAL HIGH (ref 4.0–10.5)
nRBC: 0 % (ref 0.0–0.2)

## 2019-11-18 LAB — LACTIC ACID, PLASMA
Lactic Acid, Venous: 2.6 mmol/L (ref 0.5–1.9)
Lactic Acid, Venous: 1.6 mmol/L (ref 0.5–1.9)

## 2019-11-18 LAB — PROTIME-INR
INR: 1.8 — ABNORMAL HIGH (ref 0.8–1.2)
Prothrombin Time: 20.2 seconds — ABNORMAL HIGH (ref 11.4–15.2)

## 2019-11-18 LAB — BLOOD GAS, ARTERIAL
Acid-base deficit: 0.6 mmol/L (ref 0.0–2.0)
Bicarbonate: 24.9 mmol/L (ref 20.0–28.0)
FIO2: 0.28
O2 Saturation: 96.7 %
Patient temperature: 37
pCO2 arterial: 44 mmHg (ref 32.0–48.0)
pH, Arterial: 7.36 (ref 7.350–7.450)
pO2, Arterial: 91 mmHg (ref 83.0–108.0)

## 2019-11-18 LAB — MAGNESIUM
Magnesium: 1.8 mg/dL (ref 1.7–2.4)
Magnesium: 1.9 mg/dL (ref 1.7–2.4)

## 2019-11-18 LAB — BLOOD GAS, VENOUS
Acid-Base Excess: 0.6 mmol/L (ref 0.0–2.0)
Bicarbonate: 26.9 mmol/L (ref 20.0–28.0)
FIO2: 0.28
O2 Saturation: 58.3 %
Patient temperature: 37
pCO2, Ven: 51 mmHg (ref 44.0–60.0)
pH, Ven: 7.33 (ref 7.250–7.430)
pO2, Ven: 33 mmHg (ref 32.0–45.0)

## 2019-11-18 LAB — CORTISOL: Cortisol, Plasma: 9.1 ug/dL

## 2019-11-18 LAB — PHOSPHORUS
Phosphorus: 3.8 mg/dL (ref 2.5–4.6)
Phosphorus: 4.3 mg/dL (ref 2.5–4.6)

## 2019-11-18 LAB — MRSA PCR SCREENING: MRSA by PCR: POSITIVE — AB

## 2019-11-18 LAB — HIV ANTIBODY (ROUTINE TESTING W REFLEX): HIV Screen 4th Generation wRfx: NONREACTIVE

## 2019-11-18 LAB — TROPONIN I (HIGH SENSITIVITY): Troponin I (High Sensitivity): 96 ng/L — ABNORMAL HIGH (ref <18)

## 2019-11-18 LAB — PROCALCITONIN
Procalcitonin: 30.72 ng/mL
Procalcitonin: 39.28 ng/mL

## 2019-11-18 MED ORDER — MORPHINE SULFATE (PF) 2 MG/ML IV SOLN
1.0000 mg | Freq: Once | INTRAVENOUS | Status: AC
  Administered 2019-11-18: 1 mg via INTRAVENOUS

## 2019-11-18 MED ORDER — SODIUM CHLORIDE 0.9 % IV SOLN
1.0000 g | INTRAVENOUS | Status: DC
  Administered 2019-11-18: 1 g via INTRAVENOUS
  Filled 2019-11-18 (×2): qty 1

## 2019-11-18 MED ORDER — DEXTROSE 10 % IV SOLN: INTRAVENOUS | Status: DC

## 2019-11-18 MED ORDER — NOREPINEPHRINE 16 MG/250ML-% IV SOLN
0.0000 ug/min | INTRAVENOUS | Status: DC
  Administered 2019-11-18: 22 ug/min via INTRAVENOUS
  Administered 2019-11-18: 14 ug/min via INTRAVENOUS
  Administered 2019-11-19: 22 ug/min via INTRAVENOUS
  Administered 2019-11-20: 10 ug/min via INTRAVENOUS
  Administered 2019-11-21: 4 ug/min via INTRAVENOUS
  Filled 2019-11-18 (×5): qty 250

## 2019-11-18 MED ORDER — ORAL CARE MOUTH RINSE
15.0000 mL | Freq: Two times a day (BID) | OROMUCOSAL | Status: DC
  Administered 2019-11-18 – 2019-12-04 (×24): 15 mL via OROMUCOSAL

## 2019-11-18 MED ORDER — CHLORHEXIDINE GLUCONATE CLOTH 2 % EX PADS
6.0000 | MEDICATED_PAD | Freq: Every day | CUTANEOUS | Status: DC
  Administered 2019-11-19 – 2019-12-03 (×12): 6 via TOPICAL

## 2019-11-18 MED ORDER — SODIUM CHLORIDE 0.9 % IV SOLN
2.0000 g | INTRAVENOUS | Status: DC
  Filled 2019-11-18: qty 2

## 2019-11-18 MED ORDER — AMIODARONE IV BOLUS ONLY 150 MG/100ML
INTRAVENOUS | Status: AC
  Administered 2019-11-18: 150 mg
  Filled 2019-11-18: qty 100

## 2019-11-18 MED ORDER — AMIODARONE LOAD VIA INFUSION
150.0000 mg | Freq: Once | INTRAVENOUS | Status: DC
  Filled 2019-11-18: qty 83.34

## 2019-11-18 MED ORDER — MORPHINE SULFATE (PF) 2 MG/ML IV SOLN
INTRAVENOUS | Status: AC
  Filled 2019-11-18: qty 1

## 2019-11-18 MED ORDER — DEXTROSE 50 % IV SOLN
INTRAVENOUS | Status: AC
  Administered 2019-11-18: 50 mL
  Filled 2019-11-18: qty 50

## 2019-11-18 MED ORDER — EPOETIN ALFA 10000 UNIT/ML IJ SOLN
10000.0000 [IU] | INTRAMUSCULAR | Status: DC
  Administered 2019-11-19: 10000 [IU] via INTRAVENOUS
  Filled 2019-11-18: qty 1

## 2019-11-18 MED ORDER — ADENOSINE 12 MG/4ML IV SOLN
INTRAVENOUS | Status: AC
  Administered 2019-11-18: 6 mg
  Filled 2019-11-18: qty 8

## 2019-11-18 MED ORDER — HYDROCORTISONE NA SUCCINATE PF 100 MG IJ SOLR
50.0000 mg | Freq: Four times a day (QID) | INTRAMUSCULAR | Status: AC
  Administered 2019-11-18 – 2019-11-24 (×21): 50 mg via INTRAVENOUS
  Filled 2019-11-18 (×3): qty 2
  Filled 2019-11-18 (×2): qty 1
  Filled 2019-11-18 (×11): qty 2
  Filled 2019-11-18: qty 1
  Filled 2019-11-18 (×2): qty 2
  Filled 2019-11-18: qty 1
  Filled 2019-11-18 (×3): qty 2
  Filled 2019-11-18: qty 1

## 2019-11-18 MED ORDER — COLLAGENASE 250 UNIT/GM EX OINT
TOPICAL_OINTMENT | Freq: Every day | CUTANEOUS | Status: AC
  Administered 2019-11-22 – 2019-11-28 (×6): 1 via TOPICAL
  Filled 2019-11-18: qty 30

## 2019-11-18 MED ORDER — AMIODARONE HCL IN DEXTROSE 360-4.14 MG/200ML-% IV SOLN
30.0000 mg/h | INTRAVENOUS | Status: DC
  Administered 2019-11-19: 30 mg/h via INTRAVENOUS
  Filled 2019-11-18: qty 200

## 2019-11-18 MED ORDER — DEXTROSE 50 % IV SOLN
1.0000 | Freq: Once | INTRAVENOUS | Status: AC
  Administered 2019-11-18: 50 mL via INTRAVENOUS

## 2019-11-18 MED ORDER — PHENYLEPHRINE CONCENTRATED 100MG/250ML (0.4 MG/ML) INFUSION SIMPLE
0.0000 ug/min | INTRAVENOUS | Status: DC
  Administered 2019-11-18: 20 ug/min via INTRAVENOUS
  Administered 2019-11-19: 150 ug/min via INTRAVENOUS
  Filled 2019-11-18 (×2): qty 250

## 2019-11-18 MED ORDER — MORPHINE SULFATE (PF) 2 MG/ML IV SOLN
INTRAVENOUS | Status: AC
  Administered 2019-11-18: 1 mg via INTRAVENOUS
  Filled 2019-11-18: qty 1

## 2019-11-18 MED ORDER — DEXTROSE 50 % IV SOLN
2.0000 | Freq: Once | INTRAVENOUS | Status: AC
  Administered 2019-11-18: 100 mL via INTRAVENOUS
  Filled 2019-11-18: qty 100

## 2019-11-18 MED ORDER — MORPHINE SULFATE (PF) 2 MG/ML IV SOLN
1.0000 mg | INTRAVENOUS | Status: DC | PRN
  Administered 2019-11-19 – 2019-11-22 (×9): 1 mg via INTRAVENOUS
  Filled 2019-11-18 (×10): qty 1

## 2019-11-18 MED ORDER — VANCOMYCIN VARIABLE DOSE PER UNSTABLE RENAL FUNCTION (PHARMACIST DOSING): Status: DC

## 2019-11-18 MED ORDER — PANTOPRAZOLE SODIUM 40 MG IV SOLR
40.0000 mg | Freq: Two times a day (BID) | INTRAVENOUS | Status: DC
  Administered 2019-11-18 – 2019-11-24 (×12): 40 mg via INTRAVENOUS
  Filled 2019-11-18 (×13): qty 40

## 2019-11-18 MED ORDER — MUPIROCIN 2 % EX OINT
1.0000 "application " | TOPICAL_OINTMENT | Freq: Two times a day (BID) | CUTANEOUS | Status: DC
  Administered 2019-11-18 – 2019-11-22 (×8): 1 via NASAL
  Filled 2019-11-18: qty 22

## 2019-11-18 MED ORDER — HEPARIN SODIUM (PORCINE) 1000 UNIT/ML IJ SOLN
1000.0000 [IU] | INTRAMUSCULAR | Status: DC | PRN
  Administered 2019-11-18: 1000 [IU] via INTRAVENOUS
  Filled 2019-11-18 (×2): qty 1

## 2019-11-18 MED ORDER — MORPHINE SULFATE (PF) 2 MG/ML IV SOLN
2.0000 mg | Freq: Once | INTRAVENOUS | Status: AC
  Administered 2019-11-18: 2 mg via INTRAVENOUS

## 2019-11-18 MED ORDER — AMIODARONE HCL IN DEXTROSE 360-4.14 MG/200ML-% IV SOLN
60.0000 mg/h | INTRAVENOUS | Status: DC
  Administered 2019-11-18: 60 mg/h via INTRAVENOUS
  Filled 2019-11-18: qty 200

## 2019-11-18 MED ORDER — VASOPRESSIN 20 UNITS/100 ML INFUSION FOR SHOCK
0.0000 [IU]/min | INTRAVENOUS | Status: DC
  Administered 2019-11-18 – 2019-11-20 (×4): 0.03 [IU]/min via INTRAVENOUS
  Filled 2019-11-18 (×4): qty 100

## 2019-11-18 NOTE — Progress Notes (Signed)
Pharmacy Antibiotic Note  [name] is a 49 y.o. male admitted on 11/17/2019 with sepsis.  Pharmacy has been consulted for Cefepime dosing.  Plan: Cefepime 2gm IV q24hrs (renally adjusted)  Height: 6\' 2"  (188 cm) Weight: 95.3 kg (210 lb) IBW/kg (Calculated) : 82.2  Temp (24hrs), Avg:98.2 F (36.8 C), Min:97.8 F (36.6 C), Max:98.5 F (36.9 C)  Recent Labs  Lab 11/17/19 1405 11/17/19 2000 11/18/19 0103  WBC 9.8  --   --   CREATININE 4.17*  --   --   LATICACIDVEN 3.2*  3.3* 3.6* 2.6*    Estimated Creatinine Clearance: 24.6 mL/min (A) (by C-G formula based on SCr of 4.17 mg/dL (H)).    Allergies  Allergen Reactions  . Ciprofloxacin Rash  . Sulfa Antibiotics Rash    Antimicrobials this admission:   >>    >>   Dose adjustments this admission:   Microbiology results:  BCx:   UCx:    Sputum:    MRSA PCR:   Thank you for allowing pharmacy to be a part of this patient's care.  Hart Robinsons A 11/18/2019 1:54 AM

## 2019-11-18 NOTE — Procedures (Cosign Needed)
Central Venous Catheter Insertion Procedure Note  [name]  [id number]  07-27-1970  Date:11/18/19  Time:1:09 AM   Provider Performing:Aeric Burnham D Dewaine Conger   Procedure: Insertion of Non-tunneled Central Venous Catheter(36556)with US guidance ([id number])    Indication(s) Medication administration, Difficult access and Hemodialysis  Consent Risks of the procedure as well as the alternatives and risks of each were explained to the patient and/or caregiver.  Consent for the procedure was obtained and is signed in the bedside chart  Anesthesia Topical only with 1% lidocaine   Timeout Verified patient identification, verified procedure, site/side was marked, verified correct patient position, special equipment/implants available, medications/allergies/relevant history reviewed, required imaging and test results available.  Sterile Technique Maximal sterile technique including full sterile barrier drape, hand hygiene, sterile gown, sterile gloves, mask, hair covering, sterile ultrasound probe cover (if used).  Procedure Description Area of catheter insertion was cleaned with chlorhexidine and draped in sterile fashion.   With real-time ultrasound guidance a HD catheter was placed into the left femoral vein.  Nonpulsatile blood flow and easy flushing noted in all ports.  The catheter was sutured in place and sterile dressing applied.  Complications/Tolerance None; patient tolerated the procedure well. Chest X-ray is ordered to verify placement for internal jugular or subclavian cannulation.  Chest x-ray is not ordered for femoral cannulation.  EBL Minimal  Specimen(s) None  BIOPATCH applied to the insertion site.    Darel Hong, AGACNP-BC [facility] Pager: [phone]

## 2019-11-18 NOTE — Consult Note (Signed)
Pharmacy Antibiotic Note  [name] is a 49 y.o. male admitted on 11/17/2019 with cellulitis. Pt presented with pain and swelling of right leg in area of recent wound. PMH ESRD on HD TTS and lupus. According to RN patient received vancomycin 1000mg  and ceftazidime during dialysis session prior to hospital admission. Pharmacy has been consulted for vancomycin and cefepime dosing.  Plan: Nephrology assessing daily for HD, with possible consideration for CRRT. Will discontinue vancomycin standing order and assess daily for HD plans. Patient received 1000 mg IV x 1 here in addition to 1000 mg given outpatient prior to arrival.  Continue cefepime 1 g IV q24h in the evenings  Height: 6\' 2"  (188 cm) Weight: 96.2 kg (212 lb 1.3 oz) IBW/kg (Calculated) : 82.2  Temp (24hrs), Avg:98.3 F (36.8 C), Min:97.8 F (36.6 C), Max:99.1 F (37.3 C)  Recent Labs  Lab 11/17/19 1405 11/17/19 2000 11/18/19 0103 11/18/19 0500  WBC 9.8  --   --  17.8*  CREATININE 4.17*  --   --  4.80*  LATICACIDVEN 3.2*  3.3* 3.6* 2.6*  --     Estimated Creatinine Clearance: 21.4 mL/min (A) (by C-G formula based on SCr of 4.8 mg/dL (H)).    Allergies  Allergen Reactions  . Ciprofloxacin Rash  . Sulfa Antibiotics Rash    Antimicrobials this admission: According to RN patient received vancomycin 1000mg  and ceftazidime prior to hospital admission 10/28 vancomycin >>  10/28 ceftriaxone >>   Microbiology results: 10/28 BCx: NG 10/28 Wound: pending  Thank you for allowing pharmacy to be a part of this patient's care.  Tawnya Crook, PharmD Clinical Pharmacist 11/18/2019 2:44 PM

## 2019-11-18 NOTE — Plan of Care (Signed)

## 2019-11-18 NOTE — Progress Notes (Signed)
[facility]  ROUNDING NOTE   Subjective:  Patient well-known to [place] from outpatient hemodialysis. He normally dialyzes on TTS schedule. He did undergo dialysis yesterday. Here now with sepsis from lower extremity cellulitis.   Objective:  Vital signs in last 24 hours:  Temp:  [97.8 F (36.6 C)-99.1 F (37.3 C)] 99.1 F (37.3 C) (10/29 0800) Pulse Rate:  [64-84] 64 (10/29 0800) Resp:  [10-30] 15 (10/29 0800) BP: (69-112)/(29-54) 97/49 (10/29 0800) SpO2:  [89 %-99 %] 97 % (10/29 0800) FiO2 (%):  [32 %] 32 % (10/29 0500) Weight:  [95.3 kg-96.2 kg] 96.2 kg (10/29 0405)  Weight change:  Filed Weights   11/17/19 1322 11/18/19 0405  Weight: 95.3 kg 96.2 kg    Intake/Output: I/O last 3 completed shifts: In: 3449.5 [I.V.:221.5; IV [id number]:2297] Out: -    Intake/Output this shift:  No intake/output data recorded.  Physical Exam: General:  Critically ill-appearing  Head:  Normocephalic, atraumatic. Moist oral mucosal membranes  Eyes:  Anicteric  Neck:  Supple  Lungs:   Clear to auscultation, normal effort  Heart:  L8X2 2/6 systolic ejection murmur  Abdomen:   Soft, nontender, bowel sounds present  Extremities:  Significant swelling of RLE, with wound that is covered  Neurologic:  Arousable but not consistently following commands  Skin:  No lesions  Access:  Left upper extremity AV fistula    Basic Metabolic Panel: Recent Labs  Lab 11/17/19 1405 11/17/19 1534 11/18/19 0500  NA 139  --  136  K 4.1  --  4.5  CL 97*  --  100  CO2 27  --  25  GLUCOSE 61*  --  61*  BUN 18  --  25*  CREATININE 4.17*  --  4.80*  CALCIUM 9.5  --  8.9  MG  --  2.1 1.8  PHOS  --   --  3.8    Liver Function Tests: Recent Labs  Lab 11/17/19 1405 11/18/19 0500  AST 26 17  ALT 8 10  ALKPHOS 129* 117  BILITOT 1.6* 2.1*  PROT 7.2 6.1*  ALBUMIN 3.4* 2.8*   No results for input(s): LIPASE, AMYLASE in the last 168 hours. No results for input(s): AMMONIA in the last  168 hours.  CBC: Recent Labs  Lab 11/17/19 1405 11/18/19 0500  WBC 9.8 17.8*  NEUTROABS 8.9* 15.1*  HGB 8.9* 8.3*  HCT 28.4* 26.6*  MCV 93.4 93.3  PLT 117* 99*    Cardiac Enzymes: Recent Labs  Lab 11/17/19 1405  CKTOTAL 136    BNP: Invalid input(s): POCBNP  CBG: Recent Labs  Lab 11/18/19 0320 11/18/19 0603 11/18/19 0647 11/18/19 0730 11/18/19 0827  GLUCAP 82 50* 88 56* 72    Microbiology: Results for orders placed or performed during the hospital encounter of 11/17/19  Wound or Superficial Culture     Status: None (Preliminary result)   Collection Time: 11/17/19  2:03 PM   Specimen: Wound  Result Value Ref Range Status   Specimen Description   Final    WOUND Performed at [facility], [address], [place], [place] [address]    Special Requests   Final    RL Performed at [facility], [address] [place], [place] [address]    Gram Stain   Final    NO WBC SEEN ABUNDANT GRAM NEGATIVE RODS FEW GRAM POSITIVE COCCI Performed at [facility], [place] [address], [place], [place] [address]    Culture PENDING  Incomplete  Report Status PENDING  Incomplete  Respiratory Panel by RT PCR (Flu A&B, Covid) - Nasopharyngeal Swab     Status: None   Collection Time: 11/17/19  2:05 PM   Specimen: Nasopharyngeal Swab  Result Value Ref Range Status   SARS Coronavirus 2 by RT PCR NEGATIVE NEGATIVE Final    Comment: (NOTE) SARS-CoV-2 target nucleic acids are NOT DETECTED.  The SARS-CoV-2 RNA is generally detectable in upper respiratoy specimens during the acute phase of infection. The lowest concentration of SARS-CoV-2 viral copies this assay can detect is 131 copies/mL. A negative result does not preclude SARS-Cov-2 infection and should not be used as the sole basis for treatment or other patient management decisions. A negative result may occur with  improper specimen collection/handling, submission of specimen other than  nasopharyngeal swab, presence of viral mutation(s) within the areas targeted by this assay, and inadequate number of viral copies (<131 copies/mL). A negative result must be combined with clinical observations, patient history, and epidemiological information. The expected result is Negative.  Fact Sheet for Patients:  PinkCheek.be  Fact Sheet for Healthcare Providers:  GravelBags.it  This test is no t yet approved or cleared by the [place] FDA and  has been authorized for detection and/or diagnosis of SARS-CoV-2 by FDA under an Emergency Use Authorization (EUA). This EUA will remain  in effect (meaning this test can be used) for the duration of the COVID-19 declaration under Section 564(b)(1) of the Act, 21 U.S.C. section 360bbb-3(b)(1), unless the authorization is terminated or revoked sooner.     Influenza A by PCR NEGATIVE NEGATIVE Final   Influenza B by PCR NEGATIVE NEGATIVE Final    Comment: (NOTE) The Xpert Xpress SARS-CoV-2/FLU/RSV assay is intended as an aid in  the diagnosis of influenza from Nasopharyngeal swab specimens and  should not be used as a sole basis for treatment. Nasal washings and  aspirates are unacceptable for Xpert Xpress SARS-CoV-2/FLU/RSV  testing.  Fact Sheet for Patients: PinkCheek.be  Fact Sheet for Healthcare Providers: GravelBags.it  This test is not yet approved or cleared by the [place] FDA and  has been authorized for detection and/or diagnosis of SARS-CoV-2 by  FDA under an Emergency Use Authorization (EUA). This EUA will remain  in effect (meaning this test can be used) for the duration of the  Covid-19 declaration under Section 564(b)(1) of the Act, 21  U.S.C. section 360bbb-3(b)(1), unless the authorization is  terminated or revoked. Performed at [facility], [address] [place], [place]  [address]   Blood Culture (routine x 2)     Status: None (Preliminary result)   Collection Time: 11/17/19  2:06 PM   Specimen: BLOOD  Result Value Ref Range Status   Specimen Description BLOOD BLOOD RIGHT HAND  Final   Special Requests   Final    BOTTLES DRAWN AEROBIC AND ANAEROBIC Blood Culture results may not be optimal due to an inadequate volume of blood received in culture bottles   Culture   Final    NO GROWTH < 24 HOURS Performed at [facility], [address], [place], [place] [address]    Report Status PENDING  Incomplete  Blood Culture (routine x 2)     Status: None (Preliminary result)   Collection Time: 11/17/19  2:06 PM   Specimen: BLOOD  Result Value Ref Range Status   Specimen Description BLOOD BLOOD RIGHT HAND  Final   Special Requests   Final    BOTTLES DRAWN AEROBIC AND ANAEROBIC  Blood Culture adequate volume   Culture   Final    NO GROWTH < 12 HOURS Performed at [facility], [address] [place], [place] [address]    Report Status PENDING  Incomplete  MRSA PCR Screening     Status: Abnormal   Collection Time: 11/18/19  1:03 AM   Specimen: Nasopharyngeal  Result Value Ref Range Status   MRSA by PCR POSITIVE (A) NEGATIVE Corrected    Comment:        The GeneXpert MRSA Assay (FDA approved for NASAL specimens only), is one component of a comprehensive MRSA colonization surveillance program. It is not intended to diagnose MRSA infection nor to guide or monitor treatment for MRSA infections. RESULT CALLED TO, READ BACK BY AND VERIFIED WITH: CYNTHIA VASQUEZ @0245  ON 11/18/19 SKL Performed at [facility], [address] [place], [place] [address] CORRECTED ON 10/29 AT 0249: PREVIOUSLY REPORTED AS POSITIVE        The GeneXpert MRSA Assay (FDA approved for NASAL specimens only), is one component of a comprehensive MRSA colonization surveillance program. It is not intended to diagnose MRSA infection  nor to guide or monitor  treatment for MRSA infections. CYNTHIA VASQUEZ @0245  ON 11/18/19 SKL     Coagulation Studies: Recent Labs    11/17/19 1405 11/18/19 0500  LABPROT 14.8 20.2*  INR 1.2 1.8*    Urinalysis: No results for input(s): COLORURINE, LABSPEC, PHURINE, GLUCOSEU, HGBUR, BILIRUBINUR, KETONESUR, PROTEINUR, UROBILINOGEN, NITRITE, LEUKOCYTESUR in the last 72 hours.  Invalid input(s): APPERANCEUR    Imaging: CT Tibia Fibula Right Wo Contrast  Result Date: 11/17/2019 CLINICAL DATA:  Right lower extremity pain and swelling. History of laceration. EXAM: CT OF THE LOWER RIGHT EXTREMITY WITHOUT CONTRAST TECHNIQUE: Multidetector CT imaging of the right lower extremity was performed according to the standard protocol. COMPARISON:  None. FINDINGS: Diffuse and marked subcutaneous soft tissue swelling/edema/fluid suggesting severe cellulitis. I do not see a discrete drainable fluid collection to suggest a soft tissue abscess. No findings suspicious for myofasciitis or pyomyositis. No knee or ankle joint effusion is identified. Severe vascular disease for age. No CT findings suspicious for septic arthritis or osteomyelitis. IMPRESSION: 1. Diffuse and marked subcutaneous soft tissue swelling/edema/fluid suggesting severe cellulitis. No discrete drainable abscess. 2. No findings suspicious for myofasciitis or pyomyositis. 3. Severe vascular disease for age. 4. No findings suspicious for osteomyelitis or septic arthritis. Electronically Signed   By: Marijo Sanes M.D.   On: 11/17/2019 15:36   DG Chest Port 1 View  Result Date: 11/17/2019 CLINICAL DATA:  Hypotension. EXAM: PORTABLE CHEST 1 VIEW COMPARISON:  July 01, 2017 FINDINGS: Mild to moderate severity diffuse chronic appearing increased interstitial lung markings are seen. Mild atelectasis is noted within the bilateral lung bases. There is a small, stable right pleural effusion. No pneumothorax is identified. The cardiac silhouette is markedly enlarged and unchanged  in size. The visualized skeletal structures are unremarkable. IMPRESSION: 1. Diffuse chronic appearing increased interstitial lung markings. A small superimposed component of interstitial edema cannot be excluded. 2. Mild bibasilar atelectasis. 3. Small right pleural effusion. Electronically Signed   By: Virgina Norfolk M.D.   On: 11/17/2019 20:41     Medications:   . sodium chloride    . sodium chloride 250 mL (11/17/19 2227)  . ceFEPime (MAXIPIME) IV    . clindamycin (CLEOCIN) IV Stopped (11/18/19 0332)  . dextrose 50 mL/hr at 11/18/19 4315  . norepinephrine (LEVOPHED) Adult infusion 20 mcg/min (11/18/19 4008)  . norepinephrine (LEVOPHED)  Adult infusion Stopped (11/18/19 0135)  . [START ON 11/19/2019] vancomycin     . aspirin EC  81 mg Oral Daily  . Chlorhexidine Gluconate Cloth  6 each Topical Daily  . ferrous sulfate  325 mg Oral BID  . heparin  5,000 Units Subcutaneous Q8H  . mouth rinse  15 mL Mouth Rinse BID  . midodrine  20 mg Oral QODAY  . morphine      . mupirocin ointment  1 application Nasal BID  . pantoprazole  40 mg Oral BID  . predniSONE  10 mg Oral Q breakfast  . sodium chloride flush  3 mL Intravenous Q12H   sodium chloride, acetaminophen **OR** acetaminophen, dextrose, heparin sodium (porcine), melatonin, morphine injection, sodium chloride flush  Assessment/ Plan:  49 y.o. male with past medical of systemic lupus erythematosus, ESRD on HD TTS, anemia of chronic kidney disease, secondary hyperparathyroidism, history of renal transplant who presents now with septic shock secondary to right lower extremity cellulitis.  [facility]S/[facility]  1.  ESRD on HD TTS.  Patient did undergo hemodialysis treatment yesterday.  Hold off on additional dialysis treatment for now.  Reevaluate for dialysis treatment tomorrow.  May need to consider CRRT.  Critical care did place a temporary left femoral dialysis catheter for this purpose if deemed necessary.  2.  Hypotension  secondary to septic shock.  Continue norepinephrine to maintain MAP of 65 or greater.  3.  Anemia of chronic kidney disease.  Hemoglobin currently 8.3.  Start the patient on Epogen 10,000 IV with dialysis starting tomorrow.  4.  Secondary hyperparathyroidism.  Continue to monitor abdominal metabolism parameters over the course of the hospitalization.   LOS: 1 Shahara Hartsfield 10/29/20218:31 AM

## 2019-11-18 NOTE — Progress Notes (Addendum)
NAME:  [name], MRN:  [id number], DOB:  [birth year], LOS: 1 ADMISSION DATE:  11/17/2019, CONSULTATION DATE:  11/17/2019  REFERRING MD:  Rufina Falco, NP, CHIEF COMPLAINT:  Right Leg Swelling  Brief History   49 y.o. Male with a PMH of ESRD on HD admitted 11/17/19 with Septic Shock in the setting of Right Lower Extremity Cellulitis.  Past Medical History  ESRD on HD (T, Th, S) Kidney Transplant on chronic Prednisone Lupus Collagen Vascular Disease Anemia (baseline Hgb 8 to 9) Diverticulitis GERD  Significant Hospital Events   10/28: Presented to ED; became hypotensive requiring Levophed; PCCM consulted  10/29: Trialysis catheter placed for anticipation pt may need CRRT (unable to tolerate HD with pressor requirements) 10/30: Developed wide QRS V-tach overnight, adenosine/amio bolus given- ultimately cardioverted back to NSR  Consults:  Hospitalist (primary service) PCCM Nephrology Wound Care  Procedures:  10/29: Left femoral trialysis catheter placed  Significant Diagnostic Tests:  10/28: CT Right Fibula/Tibia>>1. Diffuse and marked subcutaneous soft tissue swelling/edema/fluid suggesting severe cellulitis. No discrete drainable abscess. 2. No findings suspicious for myofasciitis or pyomyositis. 3. Severe vascular disease for age. 4. No findings suspicious for osteomyelitis or septic arthritis. 10/28: CXR>>1. Diffuse chronic appearing increased interstitial lung markings. A small superimposed component of interstitial edema cannot be excluded. 2. Mild bibasilar atelectasis. 3. Small right pleural effusion.  Micro Data:  10/28: SARS-CoV-2 PCR>>negative 10/28: Influenza PCR>>negative 10/28: Blood culture x2>> 10/28: Wound culture of RLE>> 10/28: Urine>> 10/29: MRSA PCR >> positive  Antimicrobials:  Vancomycin 10/28>> Clindamycin 10/28>>10/29 Cefepime 10/28>> Ceftriaxone x1 dose on 10/28  Interim history/subjective:  Patient lethargic but responsive saying  "OK" / "yes" repeatedly to questions.  Labs/ Imaging personally reviewed Net: + 3.2 L (+6.7 since admit) Na+/ K+: 135/ 4.5 BUN/Cr.: 31/ 5.32 Hgb: 8.3 WBC/ TMAX: 17.8/ 37.2  VBG: 7.33/ 51/ 33/ 26.9  Objective   Blood pressure (!) 106/54, pulse (!) 57, temperature 99 F (37.2 C), temperature source Oral, resp. rate 13, height 6\' 2"  (1.88 m), weight 96.2 kg, SpO2 99 %.    FiO2 (%):  [2 %-32 %] 2 %   Intake/Output Summary (Last 24 hours) at 11/18/2019 2204 Last data filed at 11/18/2019 2100 Gross per 24 hour  Intake 3113.2 ml  Output --  Net 3113.2 ml   Filed Weights   11/17/19 1322 11/18/19 0405  Weight: 95.3 kg 96.2 kg    Examination: General: Adult male, critically ill, lying in bed, lethargic but NAD HEENT: MM pink/moist, anicteric, atraumatic, neck supple Neuro: lethargic/responsive, simple intermittent commands, PERRL +3, MUE CV: s1s2 RRR, NSR on monitor with multifocal PVC's, murmur auscultated, no r/g Pulm: Regular, non labored on 3L [place], breath sounds clear/diminished- BUL & diminished- BLL GI: soft, rounded, non tender, bs x 4 GU: anuric on HD, LUE fistula + bruit/thrill Skin: RLE wound (see pic below) no rashes/lesions noted Extremities: warm/dry, pulses + 2 R/P, +3 edema noted       Resolved Hospital Problem list   N/A  Assessment & Plan:   Septic Shock with hypotension Wide QRS V-tach Patient requiring multiple pressors, overnight 10/29 developed wide QRS V-tach in 150's. Adenosine & amio bolus given, pt ultimately cardioverted - continuous cardiac monitoring - continue vasopressin, changing levophed to phenylephrine slowly due to v-tach overnight - maintain MAP > 65, wean as tolerated - Echocardiogram ordered - f/u serum drug screen - 2 g Mg given, replace electrolytes PRN - stress dose steroids initiated: hydrocortisone 50 mg Q6 - continue home midodrine -  lactic now 1.6 - continue Amio drip - trend troponin  Severe Sepsis in the setting of  Severe Cellulitis of the Right Lower Extremity Sepsis CT of the RLE on 11/17/19 showing evidence of subcutaneous edema consistent with cellulitis in the absence of any evidence of drainable abscess or osteomyelitis - monitor WBC/ fever curve - follow pan cultures - discontinued clindamycin due to ample coverage with vancomycin & cefepime - continue Vancomycin & Cefepime - WOC consulted, appreciate input  ESRD on HD Anion Gap Metabolic Acidosis in the setting of Lactic acidosis  Encephalopathy HD: T~Th~Sa, VBG 10/29: 7.33/ 51/ 33/ 26.9 - Strict I/O's - Daily BMP, replace electrolytes PRN - Avoid nephrotoxic agents as able, ensure adequate renal perfusion - Nephrology following, appreciate input  Anemia of Chronic Disease Thrombocytopenia, likely in setting of Sepsis - Monitor for s/s of bleeding - Daily CBC - Transfuse for Hgb <7 - continue heparin SQ for VTE -continue home epogen on HD days  Hypoglycemia 10/29 D10 had been titrated up to 125 mL/h, gave patient 2 amps of D50 and titrated D10 back down to 50 mL/h. Initiated stress dose steroids - Q 2 CBG's until 4 consecutive > 80, then Q 4 - continue D10 @ 50 mL/h - follow ICU hypo/hyperglycemia protocol  Best practice:  Diet: Renal diet, 2 L fluid restriction Pain/Anxiety/Delirium protocol (if indicated): N/A VAP protocol (if indicated): N/A DVT prophylaxis: Heparin SQ GI prophylaxis: protonix BID Glucose control: monitor, target > 80 Mobility: mobilize as tolereated Code Status: Full Family Communication: family updated PRN by daytime team- 10/30 Disposition: ICU  Labs   CBC: Recent Labs  Lab 11/17/19 1405 11/18/19 0500  WBC 9.8 17.8*  NEUTROABS 8.9* 15.1*  HGB 8.9* 8.3*  HCT 28.4* 26.6*  MCV 93.4 93.3  PLT 117* 99*    Basic Metabolic Panel: Recent Labs  Lab 11/17/19 1405 11/17/19 1534 11/18/19 0500 11/18/19 2030  NA 139  --  136 135  K 4.1  --  4.5 4.5  CL 97*  --  100 100  CO2 27  --  25 27   GLUCOSE 61*  --  61* 91  BUN 18  --  25* 31*  CREATININE 4.17*  --  4.80* 5.32*  CALCIUM 9.5  --  8.9 8.3*  MG  --  2.1 1.8  --   PHOS  --   --  3.8  --    GFR: Estimated Creatinine Clearance: 19.3 mL/min (A) (by C-G formula based on SCr of 5.32 mg/dL (H)). Recent Labs  Lab 11/17/19 1405 11/17/19 2000 11/18/19 0103 11/18/19 0500 11/18/19 2030  PROCALCITON  --   --  30.72 39.28  --   WBC 9.8  --   --  17.8*  --   LATICACIDVEN 3.2*  3.3* 3.6* 2.6*  --  1.6    Liver Function Tests: Recent Labs  Lab 11/17/19 1405 11/18/19 0500  AST 26 17  ALT 8 10  ALKPHOS 129* 117  BILITOT 1.6* 2.1*  PROT 7.2 6.1*  ALBUMIN 3.4* 2.8*   No results for input(s): LIPASE, AMYLASE in the last 168 hours. No results for input(s): AMMONIA in the last 168 hours.  ABG    Component Value Date/Time   PHART 7.36 11/18/2019 1056   PCO2ART 44 11/18/2019 1056   PO2ART 91 11/18/2019 1056   HCO3 24.9 11/18/2019 1056   ACIDBASEDEF 0.6 11/18/2019 1056   O2SAT 96.7 11/18/2019 1056     Coagulation Profile: Recent Labs  Lab 11/17/19 1405  11/18/19 0500  INR 1.2 1.8*    Cardiac Enzymes: Recent Labs  Lab 11/17/19 1405  CKTOTAL 136    HbA1C: Hgb A1c MFr Bld  Date/Time Value Ref Range Status  12/08/2014 07:50 PM  UNABLE TO REPORT DUE TO LOW HEMOGLOBIN 4.0 - 6.0 % Final    CBG: Recent Labs  Lab 11/18/19 1703 11/18/19 1754 11/18/19 1937 11/18/19 2024 11/18/19 2132  GLUCAP 69* 129* 82 86 142*      Critical care time: 45 minutes     Domingo Pulse Rust[place], AGACNP-BC [facility]    Please see Amion for pager details.   Patient seen and examined by me.  Above note and lab data reviewed.  He remains on pressors.  Our plan after discussing with the cardiologist is to stop the Neo-Synephrine and restart Levophed.  He he has been bradycardic therefore amiodarone has been stopped.  He has been confused but able to be redirected.  He was seen by nephrologist  and has been started on CRRT.  As per cardiology if he has recurrence of VT lidocaine bolus with IV infusion should be given.  Cardiology to see him tomorrow morning.   I spoke to her daughter earlier this morning while she was visiting him.  His condition treatment and prognosis were discussed with her.  All her questions were answered

## 2019-11-18 NOTE — Progress Notes (Signed)
Patient is on pressor (Levophed) due to sepsis. D/w Dr Bretta Bang. ICU team will take over for now as an attending. Please let us know once off pressors and TRH will take over next day.

## 2019-11-18 NOTE — Consult Note (Signed)
[facility] Nurse wound consult note Consultation was completed by review of records, images and assistance from the bedside nurse/clinical staff.   Reason for Consult: RLE; laceration at home. ESRD, Lupus, vascular disease, kidney transplant  Wound type: trauma  Pressure Injury POA: NA Measurement: see nursing flow sheets Wound bed: 100% black and yellow non viable tissue;  Noted lesions over the bilateral LE that are white and punched out; ? Etiology lupus or vascular disease manifestation  Drainage (amount, consistency, odor) not able to assess in the image in the chart  Periwound: edema and erythema  Dressing procedure/placement/frequency: Apply enzymatic debridement ointment to clear non viable tissue, top with small saline moist gauze dressing. Top with dry dressing, secure with kerlix or conform gauze and top with ACE wrap with light tension to secure dressing. Change daily.   Consider wound care center of patient's choice  referral for long term management at [place]  Discussed POC with patient and bedside nurse.  Re consult if needed, will not follow at this time. Thanks  Dwight Burdo [employer], RN,CWOCN, CNS, [place] [phone])

## 2019-11-18 NOTE — Progress Notes (Signed)
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 He also has a history of renal transplantation for which he is on chronic Prednisone therapy.  eICU Interventions  New Patient Evaluation completed.        Kerry Kass Zea Kostka 11/18/2019, 12:17 AM

## 2019-11-18 NOTE — Progress Notes (Signed)
Patient developed wide QRS monomorphic tachycardia in the 150's.  BP stable with levophed and vasopressin running. Patient retained his pulse and was responsive but altered, which has been his baseline since admission.  Attempted vagal maneuvers, which were unsuccessful.  Adenosine given, 6 mg x 1, without effect.  Amio bolus administered, followed by amio gtt, pt remaining in wide QRS tachycardia in the 150's. Patient cardioverted with 200 J, pre-medication given: morphine 2 mg, patient returned to NSR at 2200.   Domingo Pulse [place], AGACNP-BC [facility]    Please see Amion for pager details.

## 2019-11-19 ENCOUNTER — Inpatient Hospital Stay: Payer: No Typology Code available for payment source

## 2019-11-19 ENCOUNTER — Inpatient Hospital Stay
Admit: 2019-11-19 | Discharge: 2019-11-19 | Disposition: A | Discharge status: Home / Self Care | Payer: No Typology Code available for payment source | Attending: Pulmonary Disease | Admitting: Pulmonary Disease

## 2019-11-19 DIAGNOSIS — L03115 Cellulitis of right lower limb: Secondary | ICD-10-CM | POA: Diagnosis not present

## 2019-11-19 DIAGNOSIS — I499 Cardiac arrhythmia, unspecified: Secondary | ICD-10-CM

## 2019-11-19 DIAGNOSIS — R001 Bradycardia, unspecified: Secondary | ICD-10-CM

## 2019-11-19 LAB — CBC WITH DIFFERENTIAL/PLATELET
Abs Immature Granulocytes: 0.45 10*3/uL — ABNORMAL HIGH (ref 0.00–0.07)
Basophils Absolute: 0 10*3/uL (ref 0.0–0.1)
Basophils Relative: 0 %
Eosinophils Absolute: 0 10*3/uL (ref 0.0–0.5)
Eosinophils Relative: 0 %
HCT: 26.1 % — ABNORMAL LOW (ref 39.0–52.0)
Hemoglobin: 8.4 g/dL — ABNORMAL LOW (ref 13.0–17.0)
Immature Granulocytes: 2 %
Lymphocytes Relative: 2 %
Lymphs Abs: 0.3 10*3/uL — ABNORMAL LOW (ref 0.7–4.0)
MCH: 29.5 pg (ref 26.0–34.0)
MCHC: 32.2 g/dL (ref 30.0–36.0)
MCV: 91.6 fL (ref 80.0–100.0)
Monocytes Absolute: 0.9 10*3/uL (ref 0.1–1.0)
Monocytes Relative: 4 %
Neutro Abs: 18.6 10*3/uL — ABNORMAL HIGH (ref 1.7–7.7)
Neutrophils Relative %: 92 %
Platelets: 102 10*3/uL — ABNORMAL LOW (ref 150–400)
RBC: 2.85 MIL/uL — ABNORMAL LOW (ref 4.22–5.81)
RDW: 16.2 % — ABNORMAL HIGH (ref 11.5–15.5)
WBC: 20.2 10*3/uL — ABNORMAL HIGH (ref 4.0–10.5)
nRBC: 0 % (ref 0.0–0.2)

## 2019-11-19 LAB — BASIC METABOLIC PANEL
Anion gap: 10 (ref 5–15)
BUN: 34 mg/dL — ABNORMAL HIGH (ref 6–20)
CO2: 25 mmol/L (ref 22–32)
Calcium: 9 mg/dL (ref 8.9–10.3)
Chloride: 96 mmol/L — ABNORMAL LOW (ref 98–111)
Creatinine, Ser: 5.68 mg/dL — ABNORMAL HIGH (ref 0.61–1.24)
GFR, Estimated: 11 mL/min — ABNORMAL LOW (ref >60)
Glucose, Bld: 122 mg/dL — ABNORMAL HIGH (ref 70–99)
Potassium: 5 mmol/L (ref 3.5–5.1)
Sodium: 131 mmol/L — ABNORMAL LOW (ref 135–145)

## 2019-11-19 LAB — RENAL FUNCTION PANEL
Albumin: 2.9 g/dL — ABNORMAL LOW (ref 3.5–5.0)
Anion gap: 14 (ref 5–15)
BUN: 28 mg/dL — ABNORMAL HIGH (ref 6–20)
CO2: 22 mmol/L (ref 22–32)
Calcium: 9.5 mg/dL (ref 8.9–10.3)
Chloride: 97 mmol/L — ABNORMAL LOW (ref 98–111)
Creatinine, Ser: 3.84 mg/dL — ABNORMAL HIGH (ref 0.61–1.24)
GFR, Estimated: 18 mL/min — ABNORMAL LOW (ref >60)
Glucose, Bld: 105 mg/dL — ABNORMAL HIGH (ref 70–99)
Phosphorus: 3.7 mg/dL (ref 2.5–4.6)
Potassium: 4.2 mmol/L (ref 3.5–5.1)
Sodium: 133 mmol/L — ABNORMAL LOW (ref 135–145)

## 2019-11-19 LAB — PHOSPHORUS: Phosphorus: 4.6 mg/dL (ref 2.5–4.6)

## 2019-11-19 LAB — ECHOCARDIOGRAM COMPLETE
AV Mean grad: 16 mmHg
AV Peak grad: 25.5 mmHg
Ao pk vel: 2.53 m/s
Height: 74 in
P 1/2 time: 714 msec
S' Lateral: 3.53 cm
Weight: 3418.01 oz

## 2019-11-19 LAB — GLUCOSE, CAPILLARY
Glucose-Capillary: 105 mg/dL — ABNORMAL HIGH (ref 70–99)
Glucose-Capillary: 106 mg/dL — ABNORMAL HIGH (ref 70–99)
Glucose-Capillary: 110 mg/dL — ABNORMAL HIGH (ref 70–99)
Glucose-Capillary: 111 mg/dL — ABNORMAL HIGH (ref 70–99)
Glucose-Capillary: 112 mg/dL — ABNORMAL HIGH (ref 70–99)
Glucose-Capillary: 117 mg/dL — ABNORMAL HIGH (ref 70–99)
Glucose-Capillary: 133 mg/dL — ABNORMAL HIGH (ref 70–99)
Glucose-Capillary: 84 mg/dL (ref 70–99)

## 2019-11-19 LAB — PROCALCITONIN: Procalcitonin: 50.67 ng/mL

## 2019-11-19 LAB — TROPONIN I (HIGH SENSITIVITY): Troponin I (High Sensitivity): 81 ng/L — ABNORMAL HIGH (ref <18)

## 2019-11-19 LAB — MAGNESIUM: Magnesium: 2.4 mg/dL (ref 1.7–2.4)

## 2019-11-19 MED ORDER — SODIUM CHLORIDE 0.9 % IV SOLN
2.0000 g | Freq: Two times a day (BID) | INTRAVENOUS | Status: DC
  Administered 2019-11-19 – 2019-11-22 (×6): 2 g via INTRAVENOUS
  Filled 2019-11-19 (×7): qty 2

## 2019-11-19 MED ORDER — PRISMASOL BGK 0/2.5 32-2.5 MEQ/L EC SOLN
Status: DC
  Filled 2019-11-19: qty 5000

## 2019-11-19 MED ORDER — LORAZEPAM 2 MG/ML IJ SOLN
1.0000 mg | Freq: Once | INTRAMUSCULAR | Status: AC
  Administered 2019-11-19: 1 mg via INTRAVENOUS
  Filled 2019-11-19: qty 1

## 2019-11-19 MED ORDER — ATROPINE SULFATE 1 MG/10ML IJ SOSY: 1.0000 mg | PREFILLED_SYRINGE | Freq: Once | INTRAMUSCULAR | Status: DC | PRN

## 2019-11-19 MED ORDER — MAGNESIUM SULFATE 2 GM/50ML IV SOLN
2.0000 g | Freq: Once | INTRAVENOUS | Status: AC
  Administered 2019-11-19: 2 g via INTRAVENOUS
  Filled 2019-11-19: qty 50

## 2019-11-19 MED ORDER — HEPARIN SODIUM (PORCINE) 1000 UNIT/ML DIALYSIS
1000.0000 [IU] | INTRAMUSCULAR | Status: DC | PRN
  Administered 2019-11-20 – 2019-11-22 (×2): 3600 [IU] via INTRAVENOUS_CENTRAL
  Filled 2019-11-19 (×3): qty 6
  Filled 2019-11-19: qty 4
  Filled 2019-11-19 (×2): qty 3

## 2019-11-19 MED ORDER — SODIUM CHLORIDE 0.9 % FOR CRRT
INTRAVENOUS_CENTRAL | Status: DC | PRN
  Filled 2019-11-19: qty 1000

## 2019-11-19 MED ORDER — VANCOMYCIN HCL IN DEXTROSE 1-5 GM/200ML-% IV SOLN
1000.0000 mg | INTRAVENOUS | Status: DC
  Administered 2019-11-19 – 2019-11-20 (×2): 1000 mg via INTRAVENOUS
  Filled 2019-11-19 (×3): qty 200

## 2019-11-19 NOTE — Consult Note (Signed)
Pharmacy Antibiotic Note  [name] is a 49 y.o. male admitted on 11/17/2019 with cellulitis. Pt presented with pain and swelling of right leg in area of recent wound. PMH ESRD on HD TTS and lupus. Pharmacy has been consulted for vancomycin and cefepime dosing. Trialysis catheter placed for CRRT (unable to tolerate HD with pressor requirements) which was started today  Plan:  1) adjust vancomycin dose to 1000 mg (10 mg/kg) IV every 24 hours  Levels as clinically indicated  2) adjust cefepime to 2 grams IV every 12 hours  Height: 6\' 2"  (188 cm) Weight: 96.9 kg (213 lb 10 oz) IBW/kg (Calculated) : 82.2  Temp (24hrs), Avg:98.2 F (36.8 C), Min:97.1 F (36.2 C), Max:99 F (37.2 C)  Recent Labs  Lab 11/17/19 1405 11/17/19 2000 11/18/19 0103 11/18/19 0500 11/18/19 2030 11/19/19 0600  WBC 9.8  --   --  17.8*  --  20.2*  CREATININE 4.17*  --   --  4.80* 5.32* 5.68*  LATICACIDVEN 3.2*   3.3* 3.6* 2.6*  --  1.6  --     Estimated Creatinine Clearance: 18.1 mL/min (A) (by C-G formula based on SCr of 5.68 mg/dL (H)).    Allergies  Allergen Reactions   Ciprofloxacin Rash   Sulfa Antibiotics Rash    Antimicrobials this admission: 10/27 ceftriaxone x 1 10/29 clindamycin x 3 10/28 vancomycin >>  10/29 cefepime >>   Microbiology results: 10/28 BCx: NG x 2 days 10/28 Wound: abundant GNR, few GPC (pending) 10/29 MRSA PCR: positive 10/28 SARS CoV-2: negative 10/28 influenza A/B: negative  Thank you for allowing pharmacy to be a part of this patients care.  Dallie Piles, PharmD Clinical Pharmacist 11/19/2019 2:11 PM

## 2019-11-19 NOTE — Progress Notes (Signed)
Pt rhythm went into V tach with pulse at 2144. 6 mg Adenosine initially given as per NP Britton L. 150 mg bolus of Amiodarone given followed by 60 mg/hr infusion. Patient's VS were stable. Patient talking and complaining of chest tightness. @ mg morphine given and the patient was synchronized cardioverted x 1 shock at 200 joules @2200  with converted pt's rhythm to SR with PAC's.

## 2019-11-19 NOTE — Progress Notes (Signed)
*  PRELIMINARY RESULTS* Echocardiogram 2D Echocardiogram has been performed.  [name] 11/19/2019, 1:46 PM

## 2019-11-19 NOTE — Progress Notes (Addendum)
Shift summary:  - Assumed patient care at 1100 hrs.  - Patient is bradycardic with HR 40s-50s, on Amio gtt. Dr. Bretta Bang notified. No new orders at this time.  - CRRT initiated at 1224 hrs.  - Patient remains acutely delirious.   - Amio gtt discontinued due to continued and worsening bradycardia.

## 2019-11-19 NOTE — Plan of Care (Signed)

## 2019-11-19 NOTE — Progress Notes (Signed)
[facility]  ROUNDING NOTE   Subjective:   Daughter at bedside. Patient with waxing and waning mental status.   Mr. TRISTEN LUCE was admitted to [facility] on 11/17/2019 for Cellulitis of right lower extremity [L03.115] ESRD on hemodialysis ([place]) [N18.6, Z99.2] Hypotension [I95.9] Severe sepsis ([place]) [A41.9, R65.20]  Last hemodialysis treatment was 10/28  Objective:  Vital signs in last 24 hours:  Temp:  [98.1 F (36.7 C)-99 F (37.2 C)] 98.3 F (36.8 C) (10/30 0800) Pulse Rate:  [34-152] 48 (10/30 1100) Resp:  [9-16] 16 (10/30 1100) BP: (89-124)/(45-71) 115/54 (10/30 1100) SpO2:  [89 %-100 %] 98 % (10/30 1100) FiO2 (%):  [2 %-32 %] 32 % (10/30 0600) Weight:  [96.9 kg] 96.9 kg (10/30 0338)  Weight change: 1.645 kg Filed Weights   11/17/19 1322 11/18/19 0405 11/19/19 0338  Weight: 95.3 kg 96.2 kg 96.9 kg    Intake/Output: I/O last 3 completed shifts: In: 4745.4 [P.O.:240; I.V.:3327.3; IV Piggyback:1178.1] Out: -    Intake/Output this shift:  Total I/O In: 427.6 [I.V.:427.6] Out: -   Physical Exam: General: Critically ill  Head: Normocephalic, atraumatic. Moist oral mucosal membranes  Eyes: Anicteric, PERRL  Neck: Supple, trachea midline  Lungs:  Clear to auscultation  Heart: Irregular, tachycardia  Abdomen:  Soft, nontender,   Extremities:  +++ peripheral edema. Right lower extremity induration  Neurologic: Waxing and waning   Skin: +induration  Access: Left AVF, left temp HD catheter    Basic Metabolic Panel: Recent Labs  Lab 11/17/19 1405 11/17/19 1405 11/17/19 1534 11/18/19 0500 11/18/19 2030 11/18/19 2200 11/19/19 0600  NA 139  --   --  136 135  --  131*  K 4.1  --   --  4.5 4.5  --  5.0  CL 97*  --   --  100 100  --  96*  CO2 27  --   --  25 27  --  25  GLUCOSE 61*  --   --  61* 91  --  122*  BUN 18  --   --  25* 31*  --  34*  CREATININE 4.17*  --   --  4.80* 5.32*  --  5.68*  CALCIUM 9.5   < >  --  8.9 8.3*  --  9.0  MG  --    --  2.1 1.8  --  1.9 2.4  PHOS  --   --   --  3.8  --  4.3 4.6   < > = values in this interval not displayed.    Liver Function Tests: Recent Labs  Lab 11/17/19 1405 11/18/19 0500  AST 26 17  ALT 8 10  ALKPHOS 129* 117  BILITOT 1.6* 2.1*  PROT 7.2 6.1*  ALBUMIN 3.4* 2.8*   No results for input(s): LIPASE, AMYLASE in the last 168 hours. No results for input(s): AMMONIA in the last 168 hours.  CBC: Recent Labs  Lab 11/17/19 1405 11/18/19 0500 11/19/19 0600  WBC 9.8 17.8* 20.2*  NEUTROABS 8.9* 15.1* 18.6*  HGB 8.9* 8.3* 8.4*  HCT 28.4* 26.6* 26.1*  MCV 93.4 93.3 91.6  PLT 117* 99* 102*    Cardiac Enzymes: Recent Labs  Lab 11/17/19 1405  CKTOTAL 136    BNP: Invalid input(s): POCBNP  CBG: Recent Labs  Lab 11/19/19 0217 11/19/19 0439 11/19/19 0547 11/19/19 0757 11/19/19 1122  GLUCAP 133* 112* 106* 117* 110*    Microbiology: Results for orders placed or performed during the hospital encounter  of 11/17/19  Wound or Superficial Culture     Status: Abnormal (Preliminary result)   Collection Time: 11/17/19  2:03 PM   Specimen: Wound  Result Value Ref Range Status   Specimen Description   Final    WOUND Performed at [facility], [address] [place], [place] [address]    Special Requests   Final    RL Performed at [facility], [address] [place], [place] [address]    Gram Stain   Final    NO WBC SEEN ABUNDANT GRAM NEGATIVE RODS FEW GRAM POSITIVE COCCI Performed at [facility], [place] [address], [place], [place] [address]    Culture MULTIPLE ORGANISMS PRESENT, NONE PREDOMINANT (A)  Final   Report Status PENDING  Incomplete  Respiratory Panel by RT PCR (Flu A&B, Covid) - Nasopharyngeal Swab     Status: None   Collection Time: 11/17/19  2:05 PM   Specimen: Nasopharyngeal Swab  Result Value Ref Range Status   SARS Coronavirus 2 by RT PCR NEGATIVE NEGATIVE Final    Comment: (NOTE) SARS-CoV-2 target nucleic acids are  NOT DETECTED.  The SARS-CoV-2 RNA is generally detectable in upper respiratoy specimens during the acute phase of infection. The lowest concentration of SARS-CoV-2 viral copies this assay can detect is 131 copies/mL. A negative result does not preclude SARS-Cov-2 infection and should not be used as the sole basis for treatment or other patient management decisions. A negative result may occur with  improper specimen collection/handling, submission of specimen other than nasopharyngeal swab, presence of viral mutation(s) within the areas targeted by this assay, and inadequate number of viral copies (<131 copies/mL). A negative result must be combined with clinical observations, patient history, and epidemiological information. The expected result is Negative.  Fact Sheet for Patients:  PinkCheek.be  Fact Sheet for Healthcare Providers:  GravelBags.it  This test is no t yet approved or cleared by the [place] FDA and  has been authorized for detection and/or diagnosis of SARS-CoV-2 by FDA under an Emergency Use Authorization (EUA). This EUA will remain  in effect (meaning this test can be used) for the duration of the COVID-19 declaration under Section 564(b)(1) of the Act, 21 U.S.C. section 360bbb-3(b)(1), unless the authorization is terminated or revoked sooner.     Influenza A by PCR NEGATIVE NEGATIVE Final   Influenza B by PCR NEGATIVE NEGATIVE Final    Comment: (NOTE) The Xpert Xpress SARS-CoV-2/FLU/RSV assay is intended as an aid in  the diagnosis of influenza from Nasopharyngeal swab specimens and  should not be used as a sole basis for treatment. Nasal washings and  aspirates are unacceptable for Xpert Xpress SARS-CoV-2/FLU/RSV  testing.  Fact Sheet for Patients: PinkCheek.be  Fact Sheet for Healthcare Providers: GravelBags.it  This test is not yet  approved or cleared by the [place] FDA and  has been authorized for detection and/or diagnosis of SARS-CoV-2 by  FDA under an Emergency Use Authorization (EUA). This EUA will remain  in effect (meaning this test can be used) for the duration of the  Covid-19 declaration under Section 564(b)(1) of the Act, 21  U.S.C. section 360bbb-3(b)(1), unless the authorization is  terminated or revoked. Performed at [facility], [address] [place],  [address]   Blood Culture (routine x 2)     Status: None (Preliminary result)   Collection Time: 11/17/19  2:06 PM   Specimen: BLOOD  Result Value Ref Range Status   Specimen Description BLOOD  BLOOD RIGHT HAND  Final   Special Requests   Final    BOTTLES DRAWN AEROBIC AND ANAEROBIC Blood Culture results may not be optimal due to an inadequate volume of blood received in culture bottles   Culture   Final    NO GROWTH 2 DAYS Performed at [facility], [address], [place], [place] [address]    Report Status PENDING  Incomplete  Blood Culture (routine x 2)     Status: None (Preliminary result)   Collection Time: 11/17/19  2:06 PM   Specimen: BLOOD  Result Value Ref Range Status   Specimen Description BLOOD BLOOD RIGHT HAND  Final   Special Requests   Final    BOTTLES DRAWN AEROBIC AND ANAEROBIC Blood Culture adequate volume   Culture   Final    NO GROWTH 2 DAYS Performed at [facility], [address], [place], [place] [address]    Report Status PENDING  Incomplete  MRSA PCR Screening     Status: Abnormal   Collection Time: 11/18/19  1:03 AM   Specimen: Nasopharyngeal  Result Value Ref Range Status   MRSA by PCR POSITIVE (A) NEGATIVE Corrected    Comment:        The GeneXpert MRSA Assay (FDA approved for NASAL specimens only), is one component of a comprehensive MRSA colonization surveillance program. It is not intended to diagnose MRSA infection nor to guide or monitor treatment  for MRSA infections. RESULT CALLED TO, READ BACK BY AND VERIFIED WITH: CYNTHIA VASQUEZ @0245  ON 11/18/19 SKL Performed at [facility], [address] [place], [place] [address] CORRECTED ON 10/29 AT 0249: PREVIOUSLY REPORTED AS POSITIVE        The GeneXpert MRSA Assay (FDA approved for NASAL specimens only), is one component of a comprehensive MRSA colonization surveillance program. It is not intended to diagnose MRSA infection  nor to guide or monitor treatment for MRSA infections. CYNTHIA VASQUEZ @0245  ON 11/18/19 SKL     Coagulation Studies: Recent Labs    11/17/19 1405 11/18/19 0500  LABPROT 14.8 20.2*  INR 1.2 1.8*    Urinalysis: No results for input(s): COLORURINE, LABSPEC, PHURINE, GLUCOSEU, HGBUR, BILIRUBINUR, KETONESUR, PROTEINUR, UROBILINOGEN, NITRITE, LEUKOCYTESUR in the last 72 hours.  Invalid input(s): APPERANCEUR    Imaging: CT Tibia Fibula Right Wo Contrast  Result Date: 11/17/2019 CLINICAL DATA:  Right lower extremity pain and swelling. History of laceration. EXAM: CT OF THE LOWER RIGHT EXTREMITY WITHOUT CONTRAST TECHNIQUE: Multidetector CT imaging of the right lower extremity was performed according to the standard protocol. COMPARISON:  None. FINDINGS: Diffuse and marked subcutaneous soft tissue swelling/edema/fluid suggesting severe cellulitis. I do not see a discrete drainable fluid collection to suggest a soft tissue abscess. No findings suspicious for myofasciitis or pyomyositis. No knee or ankle joint effusion is identified. Severe vascular disease for age. No CT findings suspicious for septic arthritis or osteomyelitis. IMPRESSION: 1. Diffuse and marked subcutaneous soft tissue swelling/edema/fluid suggesting severe cellulitis. No discrete drainable abscess. 2. No findings suspicious for myofasciitis or pyomyositis. 3. Severe vascular disease for age. 4. No findings suspicious for osteomyelitis or septic arthritis. Electronically Signed   By: Marijo Sanes M.D.   On: 11/17/2019 15:36   US Venous Img Lower Unilateral Right (DVT)  Result Date: 11/19/2019 CLINICAL DATA:  49 year old male with a history of edema EXAM: RIGHT LOWER EXTREMITY VENOUS DOPPLER ULTRASOUND TECHNIQUE: Gray-scale sonography with graded compression, as well as color Doppler and duplex ultrasound were performed to evaluate  the lower extremity deep venous systems from the level of the common femoral vein and including the common femoral, femoral, profunda femoral, popliteal and calf veins including the posterior tibial, peroneal and gastrocnemius veins when visible. The superficial great saphenous vein was also interrogated. Spectral Doppler was utilized to evaluate flow at rest and with distal augmentation maneuvers in the common femoral, femoral and popliteal veins. COMPARISON:  None. FINDINGS: Contralateral Common Femoral Vein: Respiratory phasicity is normal and symmetric with the symptomatic side. No evidence of thrombus. Normal compressibility. Common Femoral Vein: No evidence of thrombus. Normal compressibility, respiratory phasicity and response to augmentation. Saphenofemoral Junction: No evidence of thrombus. Normal compressibility and flow on color Doppler imaging. Profunda Femoral Vein: No evidence of thrombus. Normal compressibility and flow on color Doppler imaging. Femoral Vein: No evidence of thrombus. Normal compressibility, respiratory phasicity and response to augmentation. Popliteal Vein: No evidence of thrombus. Normal compressibility, respiratory phasicity and response to augmentation. Calf Veins: No evidence of thrombus. Normal compressibility and flow on color Doppler imaging. Superficial Great Saphenous Vein: No evidence of thrombus. Normal compressibility and flow on color Doppler imaging. Other Findings:  Edema IMPRESSION: Sonographic survey of the right lower extremity negative for DVT Edema right lower extremity Electronically Signed   By: Corrie Mckusick D.O.    On: 11/19/2019 10:14   DG Chest Port 1 View  Result Date: 11/19/2019 CLINICAL DATA:  Sepsis.  Hypotension.  Follow-up exam. EXAM: PORTABLE CHEST 1 VIEW COMPARISON:  11/17/2019 and older studies. FINDINGS: Stable cardiomegaly.  No mediastinal or hilar masses. Thickened interstitial markings. There is additional hazy opacity at the right lung base with the right hemidiaphragm partly obscured. Suspect a small pleural effusion. No pneumothorax. IMPRESSION: 1. Stable appearance from the most recent prior exam. 2. Cardiomegaly with interstitial thickening as well as a small right effusion. Possible mild degree congestive heart failure or fluid overload. No convincing pneumonia. Electronically Signed   By: Lajean Manes M.D.   On: 11/19/2019 06:29   DG Chest Port 1 View  Result Date: 11/17/2019 CLINICAL DATA:  Hypotension. EXAM: PORTABLE CHEST 1 VIEW COMPARISON:  July 01, 2017 FINDINGS: Mild to moderate severity diffuse chronic appearing increased interstitial lung markings are seen. Mild atelectasis is noted within the bilateral lung bases. There is a small, stable right pleural effusion. No pneumothorax is identified. The cardiac silhouette is markedly enlarged and unchanged in size. The visualized skeletal structures are unremarkable. IMPRESSION: 1. Diffuse chronic appearing increased interstitial lung markings. A small superimposed component of interstitial edema cannot be excluded. 2. Mild bibasilar atelectasis. 3. Small right pleural effusion. Electronically Signed   By: Virgina Norfolk M.D.   On: 11/17/2019 20:41     Medications:   . sodium chloride    . sodium chloride 250 mL (11/17/19 2227)  . amiodarone 30 mg/hr (11/19/19 1100)  . ceFEPime (MAXIPIME) IV Stopped (11/18/19 2025)  . dextrose 30 mL/hr at 11/19/19 1100  . norepinephrine (LEVOPHED) Adult infusion Stopped (11/19/19 1040)  . phenylephrine (NEO-SYNEPHRINE) Adult infusion 150 mcg/min (11/19/19 1100)  . prismasol BGK 2/2.5  dialysis solution    . prismasol BGK 2/2.5 replacement solution    . prismasol BGK 2/2.5 replacement solution    . vasopressin 0.03 Units/min (11/19/19 1100)   . amiodarone  150 mg Intravenous Once  . aspirin EC  81 mg Oral Daily  . Chlorhexidine Gluconate Cloth  6 each Topical Daily  . collagenase   Topical Daily  . epoetin (EPOGEN/PROCRIT) injection  10,000 Units Intravenous Q  T,Th,Sa-HD  . ferrous sulfate  325 mg Oral BID  . heparin  5,000 Units Subcutaneous Q8H  . hydrocortisone sod succinate (SOLU-CORTEF) inj  50 mg Intravenous Q6H  . mouth rinse  15 mL Mouth Rinse BID  . midodrine  20 mg Oral QODAY  . mupirocin ointment  1 application Nasal BID  . pantoprazole (PROTONIX) IV  40 mg Intravenous Q12H  . sodium chloride flush  3 mL Intravenous Q12H  . vancomycin variable dose per unstable renal function (pharmacist dosing)   Does not apply See admin instructions   sodium chloride, acetaminophen **OR** acetaminophen, dextrose, heparin, heparin sodium (porcine), melatonin, morphine injection, sodium chloride, sodium chloride flush  Assessment/ Plan:  [name] is a 49 y.o. black male with end stage renal disease on hemodialysis, history of kidney transplant, lupus nephritis, and hypotension who was admitted to [facility] on 11/17/2019 for Cellulitis of right lower extremity [B49.969] ESRD on hemodialysis ([place]) [N18.6, Z99.2] Hypotension [I95.9] Severe sepsis ([place]) [A41.9, R65.20]  [facility] Left AVF 93kg   1. End stage renal disease: on hemodialysis. Last treatment was 10/28.  Hemodynamically unstable.  - Start continuous renal replacement therapy: CVVHDF 2K bath  2. Sepsis/hypotension with right lower extremity cellulitis and leukocytosis. Blood cultures with no growth.  - hydrocortisone - broad spectrum antibiotics: cefepime and vancomycin - vasopressors: norepinephrine, vasopressine and phenylephrine.   3. Anemia with chronic kidney disease: with  thrombocytopenia.  EPO as outpatient.  Will redose EPO for CRRT  4. Tachycardia - amiodarone.    LOS: 2 Corey Laski 10/30/202112:00 PM

## 2019-11-20 ENCOUNTER — Inpatient Hospital Stay: Payer: No Typology Code available for payment source

## 2019-11-20 DIAGNOSIS — I472 Ventricular tachycardia: Secondary | ICD-10-CM

## 2019-11-20 DIAGNOSIS — R001 Bradycardia, unspecified: Secondary | ICD-10-CM

## 2019-11-20 DIAGNOSIS — L03115 Cellulitis of right lower limb: Secondary | ICD-10-CM | POA: Diagnosis not present

## 2019-11-20 DIAGNOSIS — R6520 Severe sepsis without septic shock: Secondary | ICD-10-CM

## 2019-11-20 DIAGNOSIS — A419 Sepsis, unspecified organism: Secondary | ICD-10-CM

## 2019-11-20 DIAGNOSIS — I493 Ventricular premature depolarization: Secondary | ICD-10-CM | POA: Diagnosis not present

## 2019-11-20 LAB — BASIC METABOLIC PANEL
Anion gap: 11 (ref 5–15)
Anion gap: 12 (ref 5–15)
BUN: 25 mg/dL — ABNORMAL HIGH (ref 6–20)
BUN: 28 mg/dL — ABNORMAL HIGH (ref 6–20)
CO2: 22 mmol/L (ref 22–32)
CO2: 24 mmol/L (ref 22–32)
Calcium: 9.5 mg/dL (ref 8.9–10.3)
Calcium: 9.9 mg/dL (ref 8.9–10.3)
Chloride: 98 mmol/L (ref 98–111)
Chloride: 99 mmol/L (ref 98–111)
Creatinine, Ser: 3.28 mg/dL — ABNORMAL HIGH (ref 0.61–1.24)
Creatinine, Ser: 3.86 mg/dL — ABNORMAL HIGH (ref 0.61–1.24)
GFR, Estimated: 18 mL/min — ABNORMAL LOW (ref >60)
GFR, Estimated: 22 mL/min — ABNORMAL LOW (ref >60)
Glucose, Bld: 112 mg/dL — ABNORMAL HIGH (ref 70–99)
Glucose, Bld: 115 mg/dL — ABNORMAL HIGH (ref 70–99)
Potassium: 4 mmol/L (ref 3.5–5.1)
Potassium: 4.6 mmol/L (ref 3.5–5.1)
Sodium: 133 mmol/L — ABNORMAL LOW (ref 135–145)
Sodium: 133 mmol/L — ABNORMAL LOW (ref 135–145)

## 2019-11-20 LAB — RENAL FUNCTION PANEL
Albumin: 2.9 g/dL — ABNORMAL LOW (ref 3.5–5.0)
Albumin: 2.7 g/dL — ABNORMAL LOW (ref 3.5–5.0)
Anion gap: 13 (ref 5–15)
Anion gap: 9 (ref 5–15)
BUN: 24 mg/dL — ABNORMAL HIGH (ref 6–20)
BUN: 21 mg/dL — ABNORMAL HIGH (ref 6–20)
CO2: 23 mmol/L (ref 22–32)
CO2: 27 mmol/L (ref 22–32)
Calcium: 9.9 mg/dL (ref 8.9–10.3)
Calcium: 9.9 mg/dL (ref 8.9–10.3)
Chloride: 98 mmol/L (ref 98–111)
Chloride: 96 mmol/L — ABNORMAL LOW (ref 98–111)
Creatinine, Ser: 3.3 mg/dL — ABNORMAL HIGH (ref 0.61–1.24)
Creatinine, Ser: 2.51 mg/dL — ABNORMAL HIGH (ref 0.61–1.24)
GFR, Estimated: 22 mL/min — ABNORMAL LOW (ref >60)
GFR, Estimated: 30 mL/min — ABNORMAL LOW (ref >60)
Glucose, Bld: 108 mg/dL — ABNORMAL HIGH (ref 70–99)
Glucose, Bld: 104 mg/dL — ABNORMAL HIGH (ref 70–99)
Phosphorus: 3.6 mg/dL (ref 2.5–4.6)
Phosphorus: 2.7 mg/dL (ref 2.5–4.6)
Potassium: 4 mmol/L (ref 3.5–5.1)
Potassium: 3.5 mmol/L (ref 3.5–5.1)
Sodium: 134 mmol/L — ABNORMAL LOW (ref 135–145)
Sodium: 132 mmol/L — ABNORMAL LOW (ref 135–145)

## 2019-11-20 LAB — CBC WITH DIFFERENTIAL/PLATELET
Abs Immature Granulocytes: 0.2 10*3/uL — ABNORMAL HIGH (ref 0.00–0.07)
Basophils Absolute: 0 10*3/uL (ref 0.0–0.1)
Basophils Relative: 0 %
Eosinophils Absolute: 0 10*3/uL (ref 0.0–0.5)
Eosinophils Relative: 0 %
HCT: 25.3 % — ABNORMAL LOW (ref 39.0–52.0)
Hemoglobin: 8.4 g/dL — ABNORMAL LOW (ref 13.0–17.0)
Immature Granulocytes: 1 %
Lymphocytes Relative: 2 %
Lymphs Abs: 0.4 10*3/uL — ABNORMAL LOW (ref 0.7–4.0)
MCH: 29.1 pg (ref 26.0–34.0)
MCHC: 33.2 g/dL (ref 30.0–36.0)
MCV: 87.5 fL (ref 80.0–100.0)
Monocytes Absolute: 0.8 10*3/uL (ref 0.1–1.0)
Monocytes Relative: 5 %
Neutro Abs: 14.1 10*3/uL — ABNORMAL HIGH (ref 1.7–7.7)
Neutrophils Relative %: 92 %
Platelets: 114 10*3/uL — ABNORMAL LOW (ref 150–400)
RBC: 2.89 MIL/uL — ABNORMAL LOW (ref 4.22–5.81)
RDW: 16.2 % — ABNORMAL HIGH (ref 11.5–15.5)
WBC: 15.5 10*3/uL — ABNORMAL HIGH (ref 4.0–10.5)
nRBC: 0.2 % (ref 0.0–0.2)

## 2019-11-20 LAB — AEROBIC CULTURE W GRAM STAIN (SUPERFICIAL SPECIMEN): Gram Stain: NONE SEEN

## 2019-11-20 LAB — GLUCOSE, CAPILLARY
Glucose-Capillary: 108 mg/dL — ABNORMAL HIGH (ref 70–99)
Glucose-Capillary: 109 mg/dL — ABNORMAL HIGH (ref 70–99)
Glucose-Capillary: 119 mg/dL — ABNORMAL HIGH (ref 70–99)

## 2019-11-20 LAB — MAGNESIUM: Magnesium: 2.1 mg/dL (ref 1.7–2.4)

## 2019-11-20 LAB — PHOSPHORUS: Phosphorus: 3.4 mg/dL (ref 2.5–4.6)

## 2019-11-20 LAB — HEPATITIS B SURFACE ANTIBODY,QUALITATIVE: Hep B S Ab: REACTIVE — AB

## 2019-11-20 LAB — HEPATITIS B CORE ANTIBODY, IGM: Hep B C IgM: NONREACTIVE

## 2019-11-20 LAB — HEPATITIS B SURFACE ANTIGEN: Hepatitis B Surface Ag: NONREACTIVE

## 2019-11-20 MED ORDER — MIDODRINE HCL 5 MG PO TABS
20.0000 mg | ORAL_TABLET | Freq: Three times a day (TID) | ORAL | Status: DC
  Administered 2019-11-20 – 2019-11-28 (×23): 20 mg via ORAL
  Filled 2019-11-20 (×24): qty 4

## 2019-11-20 MED ORDER — PRISMASOL BGK 4/2.5 32-4-2.5 MEQ/L EC SOLN: Status: DC

## 2019-11-20 MED ORDER — ENSURE ENLIVE PO LIQD
237.0000 mL | Freq: Three times a day (TID) | ORAL | Status: DC
  Administered 2019-11-20 – 2019-11-30 (×8): 237 mL via ORAL

## 2019-11-20 MED ORDER — MELATONIN 5 MG PO TABS
2.5000 mg | ORAL_TABLET | Freq: Every evening | ORAL | Status: DC | PRN
  Administered 2019-11-20 – 2019-11-21 (×2): 2.5 mg via ORAL
  Filled 2019-11-20 (×2): qty 1

## 2019-11-20 MED ORDER — MORPHINE SULFATE (PF) 2 MG/ML IV SOLN
1.0000 mg | Freq: Once | INTRAVENOUS | Status: AC
  Administered 2019-11-20: 1 mg via INTRAVENOUS
  Filled 2019-11-20: qty 1

## 2019-11-20 MED ORDER — PRISMASOL BGK 4/2.5 32-4-2.5 MEQ/L REPLACEMENT SOLN
Status: DC
  Filled 2019-11-20 (×9): qty 5000

## 2019-11-20 MED ORDER — EPOETIN ALFA 10000 UNIT/ML IJ SOLN
20000.0000 [IU] | Freq: Once | INTRAMUSCULAR | Status: AC
  Administered 2019-11-20: 20000 [IU] via SUBCUTANEOUS
  Filled 2019-11-20: qty 2

## 2019-11-20 NOTE — Plan of Care (Signed)
Pt's former wife at bedside this shift, assisted with his care. She states he lives alone, their daughter lives fairly close to him and assists him as needed, although she does have a job and is not always available.  The wound on his right lateral calf was caused by a car door that accidentally closed on him.   CRRT is running w/o issues. Pt is anuric at baseline Vasopressin has been titrated off and writing RN is currently attempting to wean levophed down very slowly per Dr Bretta Bang. Pt has been bathed and his wound care performed as ordered this shift. His appetite is poor and he remains lethargic and forgetful, although not delusional (ex wife states he could not distinguish between her and their daughter yesterday and he was hallucinating).  He does request sips of water regularly.

## 2019-11-20 NOTE — Progress Notes (Addendum)
Pt w/ bradycardia in 50s most of this shift.  When levophed was titrated to 4 mcg/min (rate was 10 mcgs/min at beginning of shift) he was observed with HR in mid/hi 40s, irregular w/possible transient slow AFib (p waves not consistently visible) Levophed increased to 5 mcg/min.  Will continue to monitor and may have to increase levophed again to maintain aequate HR.

## 2019-11-20 NOTE — Progress Notes (Addendum)
CRRT will be held overnite d/t staffing issues.  Dr Juleen China will reevaluate in AM re CRRT vs HD for the patient.  CRRT fluids order changed to 4K bath per previous conversation w/Dr Juleen China, but at this point writing RN will rinse pt back at EOS or next empty bag (currently 2K). Serum K is 3.5 this afternoon.

## 2019-11-20 NOTE — Progress Notes (Signed)
CRITICAL CARE NOTE  CC  follow up septic shock ESRD SUBJECTIVE Patient remains somewhat confused.  However occasionally is able to answer any questions he denies any significant complaints he denies any chest pain shortness of breath cough abdominal pain nausea or vomiting.  He has not been eating well.   Continues to be on CRRT with 50 mL/h fluid removal Prognosis is guarded     SIGNIFICANT EVENTS Bradycardia   BP (!) 141/58   Pulse (!) 57   Temp 98.2 F (36.8 C) (Axillary)   Resp 12   Ht 6\' 2"  (1.88 m)   Wt 96.9 kg   SpO2 93%   BMI 27.43 kg/m    REVIEW OF SYSTEMS  PATIENT IS UNABLE TO PROVIDE COMPLETE REVIEW OF SYSTEMS DUE TO confusion PHYSICAL EXAMINATION:  GENERAL:critically ill appearing, noresp distress HEAD: Normocephalic, atraumatic.  EYES: Pupils equal, round, reactive to light.  No scleral icterus.  MOUTH: Moist mucosal membrane. NECK: Supple. No thyromegaly. No nodules. No JVD.  PULMONARY: No wheezing or rhonchi CARDIOVASCULAR: S1 and S2. Regular rate and rhythm.  Bradycardic.  No murmurs, rubs, or gallops.  GASTROINTESTINAL: Soft, nontender, not distended. No masses. Positive bowel sounds. No hepatosplenomegaly.  MUSCULOSKELETAL: No swelling, clubbing, or edema.  NEUROLOGIC: No focal deficits but confused intermittently. Right lower extremity wound dressed. INTAKE/OUTPUT  Intake/Output Summary (Last 24 hours) at 11/20/2019 1343 Last data filed at 11/20/2019 1300 Gross per 24 hour  Intake 1848.81 ml  Output 2990.1 ml  Net -1141.29 ml    LABS  CBC Recent Labs  Lab 11/18/19 0500 11/19/19 0600 11/20/19 0509  WBC 17.8* 20.2* 15.5*  HGB 8.3* 8.4* 8.4*  HCT 26.6* 26.1* 25.3*  PLT 99* 102* 114*   Coag's Recent Labs  Lab 11/17/19 1405 11/18/19 0500  APTT 39*  --   INR 1.2 1.8*   BMET Recent Labs  Lab 11/19/19 1645 11/19/19 2348 11/20/19 0509  NA 133* 133* 133*  134*  K 4.2 4.6 4.0  4.0  CL 97* 99 98  98  CO2 22 22 24  23    BUN 28* 28* 25*  24*  CREATININE 3.84* 3.86* 3.28*  3.30*  GLUCOSE 105* 115* 112*  108*   Electrolytes Recent Labs  Lab 11/18/19 2030 11/18/19 2200 11/19/19 0600 11/19/19 0600 11/19/19 1645 11/19/19 2348 11/20/19 0509  CALCIUM   < >  --  9.0   < > 9.5 9.5 9.9  9.9  MG  --  1.9 2.4  --   --   --  2.1  PHOS   < > 4.3 4.6  --  3.7  --  3.4  3.6   < > = values in this interval not displayed.   Sepsis Markers Recent Labs  Lab 11/17/19 2000 11/18/19 0103 11/18/19 0500 11/18/19 2030 11/19/19 0600  LATICACIDVEN 3.6* 2.6*  --  1.6  --   PROCALCITON  --  30.72 39.28  --  50.67   ABG Recent Labs  Lab 11/18/19 1056  PHART 7.36  PCO2ART 44  PO2ART 91   Liver Enzymes Recent Labs  Lab 11/17/19 1405 11/17/19 1405 11/18/19 0500 11/19/19 1645 11/20/19 0509  AST 26  --  17  --   --   ALT 8  --  10  --   --   ALKPHOS 129*  --  117  --   --   BILITOT 1.6*  --  2.1*  --   --   ALBUMIN 3.4*   < > 2.8*  2.9* 2.9*   < > = values in this interval not displayed.   Cardiac Enzymes No results for input(s): TROPONINI, PROBNP in the last 168 hours. Glucose Recent Labs  Lab 11/19/19 1122 11/19/19 1540 11/19/19 2000 11/20/19 0004 11/20/19 0354 11/20/19 0727  GLUCAP 110* 84 111* 108* 119* 109*     Recent Results (from the past 240 hour(s))  Wound or Superficial Culture     Status: Abnormal   Collection Time: 11/17/19  2:03 PM   Specimen: Wound  Result Value Ref Range Status   Specimen Description   Final    WOUND Performed at [facility], [address] [place], [place] [address]    Special Requests   Final    RL Performed at [facility], [address] [place], [place] [address]    Gram Stain   Final    NO WBC SEEN ABUNDANT GRAM NEGATIVE RODS FEW GRAM POSITIVE COCCI Performed at [facility], [place] [address], [place], [place] [address]    Culture MULTIPLE ORGANISMS PRESENT, NONE PREDOMINANT (A)  Final   Report Status 11/20/2019  FINAL  Final  Respiratory Panel by RT PCR (Flu A&B, Covid) - Nasopharyngeal Swab     Status: None   Collection Time: 11/17/19  2:05 PM   Specimen: Nasopharyngeal Swab  Result Value Ref Range Status   SARS Coronavirus 2 by RT PCR NEGATIVE NEGATIVE Final    Comment: (NOTE) SARS-CoV-2 target nucleic acids are NOT DETECTED.  The SARS-CoV-2 RNA is generally detectable in upper respiratoy specimens during the acute phase of infection. The lowest concentration of SARS-CoV-2 viral copies this assay can detect is 131 copies/mL. A negative result does not preclude SARS-Cov-2 infection and should not be used as the sole basis for treatment or other patient management decisions. A negative result may occur with  improper specimen collection/handling, submission of specimen other than nasopharyngeal swab, presence of viral mutation(s) within the areas targeted by this assay, and inadequate number of viral copies (<131 copies/mL). A negative result must be combined with clinical observations, patient history, and epidemiological information. The expected result is Negative.  Fact Sheet for Patients:  PinkCheek.be  Fact Sheet for Healthcare Providers:  GravelBags.it  This test is no t yet approved or cleared by the [place] FDA and  has been authorized for detection and/or diagnosis of SARS-CoV-2 by FDA under an Emergency Use Authorization (EUA). This EUA will remain  in effect (meaning this test can be used) for the duration of the COVID-19 declaration under Section 564(b)(1) of the Act, 21 U.S.C. section 360bbb-3(b)(1), unless the authorization is terminated or revoked sooner.     Influenza A by PCR NEGATIVE NEGATIVE Final   Influenza B by PCR NEGATIVE NEGATIVE Final    Comment: (NOTE) The Xpert Xpress SARS-CoV-2/FLU/RSV assay is intended as an aid in  the diagnosis of influenza from Nasopharyngeal swab specimens and  should  not be used as a sole basis for treatment. Nasal washings and  aspirates are unacceptable for Xpert Xpress SARS-CoV-2/FLU/RSV  testing.  Fact Sheet for Patients: PinkCheek.be  Fact Sheet for Healthcare Providers: GravelBags.it  This test is not yet approved or cleared by the [place] FDA and  has been authorized for detection and/or diagnosis of SARS-CoV-2 by  FDA under an Emergency Use Authorization (EUA). This EUA will remain  in effect (meaning this test can be used) for the duration of the  Covid-19 declaration under Section 564(b)(1) of the Act, 21  U.S.C. section 360bbb-3(b)(1), unless the authorization is  terminated or revoked. Performed at [facility], [address], [place], [place] [address]   Blood Culture (routine x 2)     Status: None (Preliminary result)   Collection Time: 11/17/19  2:06 PM   Specimen: BLOOD  Result Value Ref Range Status   Specimen Description BLOOD BLOOD RIGHT HAND  Final   Special Requests   Final    BOTTLES DRAWN AEROBIC AND ANAEROBIC Blood Culture results may not be optimal due to an inadequate volume of blood received in culture bottles   Culture   Final    NO GROWTH 3 DAYS Performed at [facility], [address], [place], [place] [address]    Report Status PENDING  Incomplete  Blood Culture (routine x 2)     Status: None (Preliminary result)   Collection Time: 11/17/19  2:06 PM   Specimen: BLOOD  Result Value Ref Range Status   Specimen Description BLOOD BLOOD RIGHT HAND  Final   Special Requests   Final    BOTTLES DRAWN AEROBIC AND ANAEROBIC Blood Culture adequate volume   Culture   Final    NO GROWTH 3 DAYS Performed at [facility], [address], [place], [place] [address]    Report Status PENDING  Incomplete  MRSA PCR Screening     Status: Abnormal   Collection Time: 11/18/19  1:03 AM   Specimen: Nasopharyngeal  Result Value Ref  Range Status   MRSA by PCR POSITIVE (A) NEGATIVE Corrected    Comment:        The GeneXpert MRSA Assay (FDA approved for NASAL specimens only), is one component of a comprehensive MRSA colonization surveillance program. It is not intended to diagnose MRSA infection nor to guide or monitor treatment for MRSA infections. RESULT CALLED TO, READ BACK BY AND VERIFIED WITH: CYNTHIA VASQUEZ @0245  ON 11/18/19 SKL Performed at [facility], [address] [place], [place] [address] CORRECTED ON 10/29 AT 0249: PREVIOUSLY REPORTED AS POSITIVE        The GeneXpert MRSA Assay (FDA approved for NASAL specimens only), is one component of a comprehensive MRSA colonization surveillance program. It is not intended to diagnose MRSA infection  nor to guide or monitor treatment for MRSA infections. CYNTHIA VASQUEZ @0245  ON 11/18/19 SKL     MEDICATIONS   Current Facility-Administered Medications:  .  0.9 %  sodium chloride infusion, 250 mL, Intravenous, PRN, Howerter, Justin B, DO .  0.9 %  sodium chloride infusion, 250 mL, Intravenous, Continuous, [place], Bing Neighbors, NP, Last Rate: 10 mL/hr at 11/20/19 1300, Rate Verify at 11/20/19 1300 .  acetaminophen (TYLENOL) tablet 650 mg, 650 mg, Oral, Q6H PRN **OR** acetaminophen (TYLENOL) suppository 650 mg, 650 mg, Rectal, Q6H PRN, Howerter, Justin B, DO .  aspirin EC tablet 81 mg, 81 mg, Oral, Daily, Howerter, Justin B, DO, 81 mg at 11/20/19 0847 .  atropine 1 MG/10ML injection 1 mg, 1 mg, Intravenous, Once PRN, Rust-Chester, Britton L, NP .  ceFEPIme (MAXIPIME) 2 g in sodium chloride 0.9 % 100 mL IVPB, 2 g, Intravenous, Q12H, Dallie Piles, RPH, Stopped at 11/20/19 0370 .  Chlorhexidine Gluconate Cloth 2 % PADS 6 each, 6 each, Topical, Daily, Bradly Bienenstock, NP, 6 each at 11/20/19 0848 .  collagenase (SANTYL) ointment, , Topical, Daily, Rosine Door, MD, Given at 11/20/19 [phone] .  dextrose 10 % infusion, , Intravenous, Continuous,  Rust-Chester, Britton L, NP, Last Rate: 30 mL/hr  at 11/20/19 1300, Rate Verify at 11/20/19 1300 .  dextrose 50 % solution 50 mL, 1 ampule, Intravenous, PRN, Howerter, Justin B, DO, 50 mL at 11/18/19 1741 .  feeding supplement (ENSURE ENLIVE / ENSURE PLUS) liquid 237 mL, 237 mL, Oral, TID BM, Rosine Door, MD, 237 mL at 11/20/19 0953 .  ferrous sulfate tablet 325 mg, 325 mg, Oral, BID, Howerter, Justin B, DO, 325 mg at 11/20/19 0846 .  heparin injection 1,000-6,000 Units, 1,000-6,000 Units, CRRT, PRN, Kolluru, Sarath, MD .  heparin injection 5,000 Units, 5,000 Units, Subcutaneous, Q8H, Howerter, Justin B, DO, 5,000 Units at 11/20/19 0643 .  heparin sodium (porcine) injection 1,000 Units, 1,000 Units, Intravenous, PRN, Darel Hong D, NP, 1,000 Units at 11/18/19 0657 .  hydrocortisone sodium succinate (SOLU-CORTEF) 100 MG injection 50 mg, 50 mg, Intravenous, Q6H, Rust-Chester, Britton L, NP, 50 mg at 11/20/19 0848 .  MEDLINE mouth rinse, 15 mL, Mouth Rinse, BID, Darel Hong D, NP, 15 mL at 11/20/19 0847 .  melatonin tablet 3 mg, 3 mg, Oral, QHS PRN, Howerter, Justin B, DO .  midodrine (PROAMATINE) tablet 20 mg, 20 mg, Oral, TID, Kolluru, Sarath, MD, 20 mg at 11/20/19 1113 .  morphine 2 MG/ML injection 1 mg, 1 mg, Intravenous, Q4H PRN, Darel Hong D, NP, 1 mg at 11/20/19 0405 .  mupirocin ointment (BACTROBAN) 2 % 1 application, 1 application, Nasal, BID, Bradly Bienenstock, NP, 1 application at 18/56/31 [phone] .  norepinephrine (LEVOPHED) 16 mg in 22mL premix infusion, 0-40 mcg/min, Intravenous, Titrated, Darel Hong D, NP, Last Rate: 6.56 mL/hr at 11/20/19 1300, 7 mcg/min at 11/20/19 1300 .  pantoprazole (PROTONIX) injection 40 mg, 40 mg, Intravenous, Q12H, Rust-Chester, Britton L, NP, 40 mg at 11/20/19 0846 .  phenylephrine CONCENTRATED 100mg  in sodium chloride 0.9% 23mL (0.4mg /mL) infusion, 0-400 mcg/min, Intravenous, Titrated, Rust-Chester, Huel Cote, NP, Stopped at 11/19/19 1433 .   prismasol BGK 2/2.5 dialysis solution, , CRRT, Continuous, Kolluru, Sarath, MD, Last Rate: 2,000 mL/hr at 11/20/19 1341, New Bag at 11/20/19 1341 .  prismasol BGK 2/2.5 replacement solution, , CRRT, Continuous, Kolluru, Sarath, MD, Last Rate: 500 mL/hr at 11/20/19 0830, New Bag at 11/20/19 0830 .  prismasol BGK 2/2.5 replacement solution, , CRRT, Continuous, Kolluru, Sarath, MD, Last Rate: 500 mL/hr at 11/20/19 0830, New Bag at 11/20/19 0830 .  sodium chloride 0.9 % primer fluid for CRRT, , CRRT, PRN, Kolluru, Sarath, MD .  sodium chloride flush (NS) 0.9 % injection 3 mL, 3 mL, Intravenous, Q12H, Howerter, Justin B, DO, 3 mL at 11/20/19 0847 .  sodium chloride flush (NS) 0.9 % injection 3 mL, 3 mL, Intravenous, PRN, Howerter, Justin B, DO .  vancomycin (VANCOCIN) IVPB 1000 mg/200 mL premix, 1,000 mg, Intravenous, Q24H, Dallie Piles, RPH, Stopped at 11/19/19 2159 .  vasopressin (PITRESSIN) 20 Units in sodium chloride 0.9 % 100 mL infusion-*FOR SHOCK*, 0-0.03 Units/min, Intravenous, Continuous, Rust-Chester, Huel Cote, NP, Stopped at 11/20/19 1113     Left groin dialysis catheter    ASSESSMENT AND PLAN SYNOPSIS in the setting of Severe Cellulitis of the Right Lower Extr  Septic Shock with hypotension in the setting of Severe Cellulitis of the Right Lower Extremity Patient requiring multiple pressors, Blood pressure is getting better.  He is off vasopressin and Levophed is being tapered.  Midodrine started by renal. CT of the RLE on 11/17/19 showing evidence of subcutaneous edema consistent with cellulitis in the absence of any evidence of drainable abscess or osteomyelitis - monitor WBC/ fever  curve - no no growth from blood cultures.  Wound showed multiple organisms without a  dominant growth continue Vancomycin & Cefepime Afibrile,wbc trending down    wide QRS V-tach in 150's. 10/30 Adenosine & amio bolus given, pt ultimately cardioverted -Off amiodarone now due to bradycardia.   Cardiology suspects wide-complex tachycardia in the setting of sepsis and suspect an episode of WCT due to an outflow tract region VT.  No antiarrhythmic therapy has been recommended.  Once  stable they would like to start him on low-dose beta-blockers but this can be done as an outpatient.  No need for ICD as per cardiology       ESRD on HD as out pt On CRRT at present due to hypotension   Anion Gap Metabolic Acidosis in the setting of Lactic acidosis   Encephalopathy metabolic without any motor deficits.  Likely related to renal failure hypotension and sepsis.  If it does not resolve consider CT head once patient is off CRRT.  Anemia of Chronic Disease EPO as per renal  Thrombocytopenia, likely in setting of Sepsis, better - Monitor for s/s of bleeding - Daily CBC - Transfuse for Hgb <7 - continue heparin SQ for VTE -continue home epogen on HD days  Hypoglycemia,reolve follow ICU hypo/hyperglycemia protocol  Best practice:  Diet: Renal diet, 2 L fluid restriction Pain/Anxiety/Delirium protocol (if indicated): N/A VAP protocol (if indicated): N/A DVT prophylaxis: Heparin SQ GI prophylaxis: protonix BID Glucose control: monitor, target > 80 Mobility: mobilize as tolereated Code Status: Full Family Communication: family updated  by me - 10/30 Disposition: ICU     Critical Care Time devoted to patient care services described in this note is 35  minutes.   Overall, patient is critically ill, prognosis is guarded.  Patient with Multiorgan failure and at high risk for cardiac arrest and death.   Rosine Door, MD  December 14, 2019 1:43 PM [facility]

## 2019-11-20 NOTE — Progress Notes (Signed)
[facility]  ROUNDING NOTE   Subjective:   Remains on CRRT.  UF 2155mL.  Vasopressin and norepinephrine. Off phenylephrine.  Off amiodarone due to bradycardia  Objective:  Vital signs in last 24 hours:  Temp:  [97.1 F (36.2 C)-98.2 F (36.8 C)] 98 F (36.7 C) (10/31 0000) Pulse Rate:  [29-71] 57 (10/31 0600) Resp:  [4-25] 11 (10/31 0600) BP: (87-147)/(39-83) 109/58 (10/31 0600) SpO2:  [75 %-100 %] 95 % (10/31 0600) FiO2 (%):  [32 %] 32 % (10/30 2039)  Weight change:  Filed Weights   11/17/19 1322 11/18/19 0405 11/19/19 0338  Weight: 95.3 kg 96.2 kg 96.9 kg    Intake/Output: I/O last 3 completed shifts: In: 4460.8 [I.V.:4000.6; Other:10; IV Piggyback:450.3] Out: 2269 [Other:2269]   Intake/Output this shift:  Total I/O In: 198.5 [P.O.:58; I.V.:114; IV Piggyback:26.5] Out: 219.1 [Other:219.1]  Physical Exam: General: Critically ill  Head: Normocephalic, atraumatic. Moist oral mucosal membranes  Eyes: Anicteric, PERRL  Neck: Supple, trachea midline  Lungs:  Clear to auscultation  Heart: Irregular, bradycardia  Abdomen:  Soft, nontender  Extremities:  ++ peripheral edema. Right lower extremity induration  Neurologic: Waxing and waning   Skin: +induration  Access: Left AVF, left temp HD catheter    Basic Metabolic Panel: Recent Labs  Lab 11/17/19 1405 11/17/19 1534 11/18/19 0500 11/18/19 0500 11/18/19 2030 11/18/19 2030 11/18/19 2200 11/19/19 0600 11/19/19 0600 11/19/19 1645 11/19/19 2348 11/20/19 0509  NA   < >  --  136   < > 135  --   --  131*  --  133* 133* 133*  134*  K   < >  --  4.5   < > 4.5  --   --  5.0  --  4.2 4.6 4.0  4.0  CL   < >  --  100   < > 100  --   --  96*  --  97* 99 98  98  CO2   < >  --  25   < > 27  --   --  25  --  22 22 24  23   GLUCOSE   < >  --  61*   < > 91  --   --  122*  --  105* 115* 112*  108*  BUN   < >  --  25*   < > 31*  --   --  34*  --  28* 28* 25*  24*  CREATININE   < >  --  4.80*   < > 5.32*   --   --  5.68*  --  3.84* 3.86* 3.28*  3.30*  CALCIUM   < >  --  8.9   < > 8.3*   < >  --  9.0   < > 9.5 9.5 9.9  9.9  MG  --  2.1 1.8  --   --   --  1.9 2.4  --   --   --  2.1  PHOS  --   --  3.8  --   --   --  4.3 4.6  --  3.7  --  3.4  3.6   < > = values in this interval not displayed.    Liver Function Tests: Recent Labs  Lab 11/17/19 1405 11/18/19 0500 11/19/19 1645 11/20/19 0509  AST 26 17  --   --   ALT 8 10  --   --   ALKPHOS 129* 117  --   --  BILITOT 1.6* 2.1*  --   --   PROT 7.2 6.1*  --   --   ALBUMIN 3.4* 2.8* 2.9* 2.9*   No results for input(s): LIPASE, AMYLASE in the last 168 hours. No results for input(s): AMMONIA in the last 168 hours.  CBC: Recent Labs  Lab 11/17/19 1405 11/18/19 0500 11/19/19 0600 11/20/19 0509  WBC 9.8 17.8* 20.2* 15.5*  NEUTROABS 8.9* 15.1* 18.6* 14.1*  HGB 8.9* 8.3* 8.4* 8.4*  HCT 28.4* 26.6* 26.1* 25.3*  MCV 93.4 93.3 91.6 87.5  PLT 117* 99* 102* 114*    Cardiac Enzymes: Recent Labs  Lab 11/17/19 1405  CKTOTAL 136    BNP: Invalid input(s): POCBNP  CBG: Recent Labs  Lab 11/19/19 1540 11/19/19 2000 11/20/19 0004 11/20/19 0354 11/20/19 0727  GLUCAP 62 111* 108* 119* 109*    Microbiology: Results for orders placed or performed during the hospital encounter of 11/17/19  Wound or Superficial Culture     Status: Abnormal (Preliminary result)   Collection Time: 11/17/19  2:03 PM   Specimen: Wound  Result Value Ref Range Status   Specimen Description   Final    WOUND Performed at [facility], [address], [place], [place] [address]    Special Requests   Final    RL Performed at [facility], [address], [place], [place] [address]    Gram Stain   Final    NO WBC SEEN ABUNDANT GRAM NEGATIVE RODS FEW GRAM POSITIVE COCCI Performed at [facility], [place] [address] [place],  [address]    Culture MULTIPLE ORGANISMS PRESENT, NONE PREDOMINANT (A)  Final   Report Status  PENDING  Incomplete  Respiratory Panel by RT PCR (Flu A&B, Covid) - Nasopharyngeal Swab     Status: None   Collection Time: 11/17/19  2:05 PM   Specimen: Nasopharyngeal Swab  Result Value Ref Range Status   SARS Coronavirus 2 by RT PCR NEGATIVE NEGATIVE Final    Comment: (NOTE) SARS-CoV-2 target nucleic acids are NOT DETECTED.  The SARS-CoV-2 RNA is generally detectable in upper respiratoy specimens during the acute phase of infection. The lowest concentration of SARS-CoV-2 viral copies this assay can detect is 131 copies/mL. A negative result does not preclude SARS-Cov-2 infection and should not be used as the sole basis for treatment or other patient management decisions. A negative result may occur with  improper specimen collection/handling, submission of specimen other than nasopharyngeal swab, presence of viral mutation(s) within the areas targeted by this assay, and inadequate number of viral copies (<131 copies/mL). A negative result must be combined with clinical observations, patient history, and epidemiological information. The expected result is Negative.  Fact Sheet for Patients:  PinkCheek.be  Fact Sheet for Healthcare Providers:  GravelBags.it  This test is no t yet approved or cleared by the [place] FDA and  has been authorized for detection and/or diagnosis of SARS-CoV-2 by FDA under an Emergency Use Authorization (EUA). This EUA will remain  in effect (meaning this test can be used) for the duration of the COVID-19 declaration under Section 564(b)(1) of the Act, 21 U.S.C. section 360bbb-3(b)(1), unless the authorization is terminated or revoked sooner.     Influenza A by PCR NEGATIVE NEGATIVE Final   Influenza B by PCR NEGATIVE NEGATIVE Final    Comment: (NOTE) The Xpert Xpress SARS-CoV-2/FLU/RSV assay is intended as an aid in  the diagnosis of influenza from Nasopharyngeal swab specimens and   should not be used  as a sole basis for treatment. Nasal washings and  aspirates are unacceptable for Xpert Xpress SARS-CoV-2/FLU/RSV  testing.  Fact Sheet for Patients: PinkCheek.be  Fact Sheet for Healthcare Providers: GravelBags.it  This test is not yet approved or cleared by the [place] FDA and  has been authorized for detection and/or diagnosis of SARS-CoV-2 by  FDA under an Emergency Use Authorization (EUA). This EUA will remain  in effect (meaning this test can be used) for the duration of the  Covid-19 declaration under Section 564(b)(1) of the Act, 21  U.S.C. section 360bbb-3(b)(1), unless the authorization is  terminated or revoked. Performed at [facility], [address] [place], [place] [address]   Blood Culture (routine x 2)     Status: None (Preliminary result)   Collection Time: 11/17/19  2:06 PM   Specimen: BLOOD  Result Value Ref Range Status   Specimen Description BLOOD BLOOD RIGHT HAND  Final   Special Requests   Final    BOTTLES DRAWN AEROBIC AND ANAEROBIC Blood Culture results may not be optimal due to an inadequate volume of blood received in culture bottles   Culture   Final    NO GROWTH 3 DAYS Performed at [facility], [address], [place], [place] [address]    Report Status PENDING  Incomplete  Blood Culture (routine x 2)     Status: None (Preliminary result)   Collection Time: 11/17/19  2:06 PM   Specimen: BLOOD  Result Value Ref Range Status   Specimen Description BLOOD BLOOD RIGHT HAND  Final   Special Requests   Final    BOTTLES DRAWN AEROBIC AND ANAEROBIC Blood Culture adequate volume   Culture   Final    NO GROWTH 3 DAYS Performed at [facility], [address], [place], [place] [address]    Report Status PENDING  Incomplete  MRSA PCR Screening     Status: Abnormal   Collection Time: 11/18/19  1:03 AM   Specimen: Nasopharyngeal  Result  Value Ref Range Status   MRSA by PCR POSITIVE (A) NEGATIVE Corrected    Comment:        The GeneXpert MRSA Assay (FDA approved for NASAL specimens only), is one component of a comprehensive MRSA colonization surveillance program. It is not intended to diagnose MRSA infection nor to guide or monitor treatment for MRSA infections. RESULT CALLED TO, READ BACK BY AND VERIFIED WITH: CYNTHIA VASQUEZ @0245  ON 11/18/19 SKL Performed at [facility], [address] [place],  [address] CORRECTED ON 10/29 AT 0249: PREVIOUSLY REPORTED AS POSITIVE        The GeneXpert MRSA Assay (FDA approved for NASAL specimens only), is one component of a comprehensive MRSA colonization surveillance program. It is not intended to diagnose MRSA infection  nor to guide or monitor treatment for MRSA infections. CYNTHIA VASQUEZ @0245  ON 11/18/19 SKL     Coagulation Studies: Recent Labs    11/17/19 1405 11/18/19 0500  LABPROT 14.8 20.2*  INR 1.2 1.8*    Urinalysis: No results for input(s): COLORURINE, LABSPEC, PHURINE, GLUCOSEU, HGBUR, BILIRUBINUR, KETONESUR, PROTEINUR, UROBILINOGEN, NITRITE, LEUKOCYTESUR in the last 72 hours.  Invalid input(s): APPERANCEUR    Imaging: US Venous Img Lower Unilateral Right (DVT)  Result Date: 11/19/2019 CLINICAL DATA:  49 year old male with a history of edema EXAM: RIGHT LOWER EXTREMITY VENOUS DOPPLER ULTRASOUND TECHNIQUE: Gray-scale sonography with graded compression, as well as color Doppler and duplex ultrasound were performed to evaluate the lower extremity deep venous  systems from the level of the common femoral vein and including the common femoral, femoral, profunda femoral, popliteal and calf veins including the posterior tibial, peroneal and gastrocnemius veins when visible. The superficial great saphenous vein was also interrogated. Spectral Doppler was utilized to evaluate flow at rest and with distal augmentation maneuvers in the common femoral,  femoral and popliteal veins. COMPARISON:  None. FINDINGS: Contralateral Common Femoral Vein: Respiratory phasicity is normal and symmetric with the symptomatic side. No evidence of thrombus. Normal compressibility. Common Femoral Vein: No evidence of thrombus. Normal compressibility, respiratory phasicity and response to augmentation. Saphenofemoral Junction: No evidence of thrombus. Normal compressibility and flow on color Doppler imaging. Profunda Femoral Vein: No evidence of thrombus. Normal compressibility and flow on color Doppler imaging. Femoral Vein: No evidence of thrombus. Normal compressibility, respiratory phasicity and response to augmentation. Popliteal Vein: No evidence of thrombus. Normal compressibility, respiratory phasicity and response to augmentation. Calf Veins: No evidence of thrombus. Normal compressibility and flow on color Doppler imaging. Superficial Great Saphenous Vein: No evidence of thrombus. Normal compressibility and flow on color Doppler imaging. Other Findings:  Edema IMPRESSION: Sonographic survey of the right lower extremity negative for DVT Edema right lower extremity Electronically Signed   By: Corrie Mckusick D.O.   On: 11/19/2019 10:14   DG Chest Port 1 View  Result Date: 11/19/2019 CLINICAL DATA:  Sepsis.  Hypotension.  Follow-up exam. EXAM: PORTABLE CHEST 1 VIEW COMPARISON:  11/17/2019 and older studies. FINDINGS: Stable cardiomegaly.  No mediastinal or hilar masses. Thickened interstitial markings. There is additional hazy opacity at the right lung base with the right hemidiaphragm partly obscured. Suspect a small pleural effusion. No pneumothorax. IMPRESSION: 1. Stable appearance from the most recent prior exam. 2. Cardiomegaly with interstitial thickening as well as a small right effusion. Possible mild degree congestive heart failure or fluid overload. No convincing pneumonia. Electronically Signed   By: Lajean Manes M.D.   On: 11/19/2019 06:29   ECHOCARDIOGRAM  COMPLETE  Result Date: 11/19/2019    ECHOCARDIOGRAM REPORT   Patient Name:   [name] Date of Exam: 11/19/2019 Medical Rec #:  [id number]       Height:       74.0 in Accession #:    [id number]      Weight:       213.6 lb Date of Birth:  [birth year]        BSA:          2.235 m Patient Age:    49 years        BP:           121/57 mmHg Patient Gender: M               HR:           61 bpm. Exam Location:  [facility] Procedure: 2D Echo Indications:     Cardiomyopathy 425.9/ I42.9  History:         Patient has prior history of Echocardiogram examinations, most                  recent 02/07/2019.  Sonographer:     Arville Go RDCS Referring Phys:  [id number] [place] L [place] Diagnosing Phys: Yolonda Kida MD  Sonographer Comments: Image acquisition challenging due to patient behavioral factors. IMPRESSIONS  1. Left ventricular ejection fraction, by estimation, is 65 to 70%. Left ventricular ejection fraction by PLAX is 64 %. The left ventricle has normal function. The left ventricle has  no regional wall motion abnormalities. Left ventricular diastolic function could not be evaluated. There is the interventricular septum is flattened in systole and diastole, consistent with right ventricular pressure and volume overload.  2. Right ventricular systolic function is normal. The right ventricular size is normal.  3. The mitral valve is grossly normal. No evidence of mitral valve regurgitation.  4. The aortic valve is normal in structure. Aortic valve regurgitation is not visualized. FINDINGS  Left Ventricle: Left ventricular ejection fraction, by estimation, is 65 to 70%. Left ventricular ejection fraction by PLAX is 64 %. The left ventricle has normal function. The left ventricle has no regional wall motion abnormalities. The left ventricular internal cavity size was normal in size. There is no left ventricular hypertrophy. The interventricular septum is flattened in systole and diastole, consistent with right  ventricular pressure and volume overload. Left ventricular diastolic function could not be evaluated. Right Ventricle: The right ventricular size is normal. No increase in right ventricular wall thickness. Right ventricular systolic function is normal. Left Atrium: Left atrial size was normal in size. Right Atrium: Right atrial size was normal in size. Pericardium: There is no evidence of pericardial effusion. Mitral Valve: The mitral valve is grossly normal. No evidence of mitral valve regurgitation. Tricuspid Valve: The tricuspid valve is normal in structure. Tricuspid valve regurgitation is trivial. Aortic Valve: The aortic valve is normal in structure. Aortic valve regurgitation is not visualized. Aortic regurgitation PHT measures 714 msec. Aortic valve mean gradient measures 16.0 mmHg. Aortic valve peak gradient measures 25.5 mmHg. Pulmonic Valve: The pulmonic valve was normal in structure. Pulmonic valve regurgitation is trivial. Aorta: The ascending aorta was not well visualized. IAS/Shunts: No atrial level shunt detected by color flow Doppler.  LEFT VENTRICLE PLAX 2D LV EF:         Left ventricular ejection fraction by PLAX is 64 %. LVIDd:         5.45 cm LVIDs:         3.53 cm LV PW:         1.55 cm LV IVS:        1.33 cm LVOT diam:     2.10 cm LVOT Area:     3.46 cm  LEFT ATRIUM         Index LA diam:    5.10 cm 2.28 cm/m  AORTIC VALVE AV Vmax:      252.50 cm/s AV Vmean:     195.000 cm/s AV VTI:       0.413 m AV Peak Grad: 25.5 mmHg    PULMONARY ARTERY AV Mean Grad: 16.0 mmHg    MPA diam:        3.30 cm AI PHT:       714 msec  AORTA Ao Root diam: 4.50 cm Ao Asc diam:  3.60 cm TRICUSPID VALVE TV Peak grad:   35.3 mmHg TV Vmax:        2.97 m/s  SHUNTS Systemic Diam: 2.10 cm Yolonda Kida MD Electronically signed by Yolonda Kida MD Signature Date/Time: 11/19/2019/3:23:25 PM    Final      Medications:   . sodium chloride    . sodium chloride Stopped (11/20/19 0850)  . ceFEPime (MAXIPIME) IV  200 mL/hr at 11/20/19 0900  . dextrose 30 mL/hr at 11/20/19 0900  . norepinephrine (LEVOPHED) Adult infusion 10 mcg/min (11/20/19 0900)  . phenylephrine (NEO-SYNEPHRINE) Adult infusion Stopped (11/19/19 1433)  . prismasol BGK 2/2.5 dialysis solution 2,000 mL/hr at 11/19/19 1454  .  prismasol BGK 2/2.5 replacement solution 500 mL/hr at 11/19/19 1214  . prismasol BGK 2/2.5 replacement solution 500 mL/hr at 11/19/19 1214  . vancomycin Stopped (11/19/19 2159)  . vasopressin 0.03 Units/min (11/20/19 0900)   . aspirin EC  81 mg Oral Daily  . Chlorhexidine Gluconate Cloth  6 each Topical Daily  . collagenase   Topical Daily  . epoetin (EPOGEN/PROCRIT) injection  10,000 Units Intravenous Q T,Th,Sa-HD  . ferrous sulfate  325 mg Oral BID  . heparin  5,000 Units Subcutaneous Q8H  . hydrocortisone sod succinate (SOLU-CORTEF) inj  50 mg Intravenous Q6H  . mouth rinse  15 mL Mouth Rinse BID  . midodrine  20 mg Oral QODAY  . mupirocin ointment  1 application Nasal BID  . pantoprazole (PROTONIX) IV  40 mg Intravenous Q12H  . sodium chloride flush  3 mL Intravenous Q12H   sodium chloride, acetaminophen **OR** acetaminophen, atropine, dextrose, heparin, heparin sodium (porcine), melatonin, morphine injection, sodium chloride, sodium chloride flush  Assessment/ Plan:  Mr. CHOUA CHALKER is a 49 y.o. black male with end stage renal disease on hemodialysis, history of kidney transplant, lupus nephritis, and hypotension who was admitted to [facility] on 11/17/2019 for Cellulitis of right lower extremity [B16.606] ESRD on hemodialysis ([place]) [N18.6, Z99.2] Hypotension [I95.9] Severe sepsis ([place]) [A41.9, R65.20]  [facility] Left AVF 93kg   1. End stage renal disease: on hemodialysis. Last intermittent treatment was 10/28.  Hemodynamically unstable.  - continuous renal replacement therapy: CVVHDF 2K bath. Continue ultrafiltration  2. Sepsis/hypotension with right lower extremity cellulitis and  leukocytosis. Blood cultures with no growth.  - stress dose hydrocortisone - broad spectrum antibiotics: cefepime and vancomycin - vasopressors: norepinephrine, vasopressine and phenylephrine.  - increase midodrine.   3. Anemia with chronic kidney disease: with thrombocytopenia.  EPO as outpatient.  Will redose EPO for CRRT   LOS: 3 Montrail Mehrer 10/31/20219:16 AM

## 2019-11-20 NOTE — Progress Notes (Signed)
Per Dr Juleen China please change CRRT fluids to 4k bath if serum potassium is <3.6

## 2019-11-20 NOTE — Consult Note (Addendum)
Cardiology Consultation:   Patient ID: [name] MRN: [id number]; DOB: Jun 21, 1970  Admit date: 11/17/2019 Date of Consult: 11/20/2019  Primary Care Provider: Merryl [name], No [facility] Cardiologist: [name] Electrophysiologist:  None   Patient Profile:   [name] is a 49 y.o. male with a hx of ESRD on iHD, prior renal transplant on chronic prednisone therapy, AoCKD who was admitted on 10/28 with lower extremity cellulitis. After an injury to the area 10/21, the area has gotten warmer, more swollen and drains pus. When he arrived in the ED he was hypotensive with a mildly elevated lactic acid level. Antibiotics were started after cultures were drawn. Prior to receiving a bed assignment from the ER, the patient became more hypotensive and required a levophed infusion.   On 10/29 in the evening the patient developed a wide QRS tachycardia in the 150s and was cardioverted with 200J after a single dose of Adenosine 6mg  was ineffective. He has since maintained normal rhythm.  Cardiology consulted re: wide complex tachycardia.      Past Medical History:  Diagnosis Date  . Anemia   . Collagen vascular disease ([place])   . Lupus ([place])   . Renal disorder   . Renal insufficiency   . Renal transplant recipient     Past Surgical History:  Procedure Laterality Date  . NEPHRECTOMY TRANSPLANTED ORGAN       Home Medications:  Prior to Admission medications   Medication Sig Start Date End Date Taking? Authorizing Provider  aspirin EC 81 MG tablet Take 81 mg by mouth daily.   Yes [provider]  ferrous sulfate 324 (65 FE) MG TBEC Take 1 tablet (325 mg total) by mouth 2 (two) times daily. 12/10/14  Yes Vaughan Basta, MD  HYDROcodone-acetaminophen (NORCO) 10-325 MG tablet Take 1 tablet by mouth every 8 (eight) hours as needed. Do Not Fill Before 11/21/2019 11/15/19  Yes Danella Sensing L, NP  midodrine (PROAMATINE) 10 MG tablet Take 2 tablets  by mouth every other day. 08/10/19  Yes [provider]  pantoprazole (PROTONIX) 40 MG tablet Take 1 tablet (40 mg total) by mouth 2 (two) times daily. 12/10/14  Yes Vaughan Basta, MD  predniSONE (DELTASONE) 10 MG tablet Take 10 mg by mouth daily. 11/02/19  Yes [provider]  promethazine (PHENERGAN) 25 MG tablet Take 25 mg by mouth every 6 (six) hours as needed for nausea or vomiting.   Yes [provider]  tamsulosin (FLOMAX) 0.4 MG CAPS capsule Take 0.8 mg by mouth as needed.    Yes [provider]  traZODone (DESYREL) 50 MG tablet Take 50 mg by mouth at bedtime.   Yes [provider]    Inpatient Medications: Scheduled Meds: . aspirin EC  81 mg Oral Daily  . Chlorhexidine Gluconate Cloth  6 each Topical Daily  . collagenase   Topical Daily  . epoetin (EPOGEN/PROCRIT) injection  10,000 Units Intravenous Q T,Th,Sa-HD  . ferrous sulfate  325 mg Oral BID  . heparin  5,000 Units Subcutaneous Q8H  . hydrocortisone sod succinate (SOLU-CORTEF) inj  50 mg Intravenous Q6H  . mouth rinse  15 mL Mouth Rinse BID  . midodrine  20 mg Oral QODAY  . mupirocin ointment  1 application Nasal BID  . pantoprazole (PROTONIX) IV  40 mg Intravenous Q12H  . sodium chloride flush  3 mL Intravenous Q12H   Continuous Infusions: . sodium chloride    . sodium chloride 10 mL/hr at  11/20/19 0700  . ceFEPime (MAXIPIME) IV Stopped (11/19/19 1755)  . dextrose 30 mL/hr at 11/20/19 0700  . norepinephrine (LEVOPHED) Adult infusion 10 mcg/min (11/20/19 0700)  . phenylephrine (NEO-SYNEPHRINE) Adult infusion Stopped (11/19/19 1433)  . prismasol BGK 2/2.5 dialysis solution 2,000 mL/hr at 11/19/19 1454  . prismasol BGK 2/2.5 replacement solution 500 mL/hr at 11/19/19 1214  . prismasol BGK 2/2.5 replacement solution 500 mL/hr at 11/19/19 1214  . vancomycin Stopped (11/19/19 2159)  . vasopressin 0.03 Units/min (11/20/19 0700)   PRN Meds: sodium chloride,  acetaminophen **OR** acetaminophen, atropine, dextrose, heparin, heparin sodium (porcine), melatonin, morphine injection, sodium chloride, sodium chloride flush  Allergies:    Allergies  Allergen Reactions  . Ciprofloxacin Rash  . Sulfa Antibiotics Rash    Social History:   Social History   Socioeconomic History  . Marital status: Divorced    Spouse name: Not on file  . Number of children: 2  . Years of education: Not on file  . Highest education level: Not on file  Occupational History  . Not on file  Tobacco Use  . Smoking status: Former Smoker    Packs/day: 0.50    Years: 13.00    Pack years: 6.50    Types: Cigarettes  . Smokeless tobacco: Never Used  Vaping Use  . Vaping Use: Never used  Substance and Sexual Activity  . Alcohol use: Yes    Comment: Occasional  . Drug use: No  . Sexual activity: Not on file  Other Topics Concern  . Not on file  Social History Narrative  . Not on file   Social Determinants of Health   Financial Resource Strain:   . Difficulty of Paying Living Expenses: Not on file  Food Insecurity:   . Worried About Charity fundraiser in the Last Year: Not on file  . Ran Out of Food in the Last Year: Not on file  Transportation Needs:   . Lack of Transportation (Medical): Not on file  . Lack of Transportation (Non-Medical): Not on file  Physical Activity:   . Days of Exercise per Week: Not on file  . Minutes of Exercise per Session: Not on file  Stress:   . Feeling of Stress : Not on file  Social Connections:   . Frequency of Communication with Friends and Family: Not on file  . Frequency of Social Gatherings with Friends and Family: Not on file  . Attends Religious Services: Not on file  . Active Member of Clubs or Organizations: Not on file  . Attends Archivist Meetings: Not on file  . Marital Status: Not on file  Intimate Partner Violence:   . Fear of Current or Ex-Partner: Not on file  . Emotionally Abused: Not on file   . Physically Abused: Not on file  . Sexually Abused: Not on file    Family History:    Family History  Problem Relation Age of Onset  . Hypertension Other   . Brain cancer Mother        Died at age 63  . Aneurysm Mother   . CAD Father   . Heart attack Father 45     ROS:  Please see the history of present illness.   All other ROS reviewed and negative.     Physical Exam/Data:   Vitals:   11/20/19 0345 11/20/19 0400 11/20/19 0500 11/20/19 0600  BP: 119/63 (!) 120/59 124/62 (!) 109/58  Pulse: 60 (!) 59 (!) 57 (!) 57  Resp: Marland Kitchen)  5 12 12 11   Temp:      TempSrc:      SpO2: 97% 98% 96% 95%  Weight:      Height:        Intake/Output Summary (Last 24 hours) at 11/20/2019 0748 Last data filed at 11/20/2019 0700 Gross per 24 hour  Intake 1903.95 ml  Output 2269 ml  Net -365.05 ml   Last 3 Weights 11/19/2019 11/18/2019 11/17/2019  Weight (lbs) 213 lb 10 oz 212 lb 1.3 oz 210 lb  Weight (kg) 96.9 kg 96.2 kg 95.255 kg     Body mass index is 27.43 kg/m.   General:  Well nourished, well developed, in no acute distress on dialysis during my exam. Tired but arousable. HEENT: normal Lymph: no adenopathy Neck: no JVD Endocrine:  No thryomegaly Vascular: L AVF.  Cardiac:  normal S1, S2; RRR; no murmur  Lungs:  clear to auscultation bilaterally, no wheezing Abd: soft  Ext: no edema Musculoskeletal:  No deformities Skin: warm and dry  Neuro:  no focal abnormalities noted Psych:  tired  EKG:  The EKG was personally reviewed and demonstrates:  Right bundle, sinus. There are multiple ECGs with a outflow tract origin PVC.  Relevant CV Studies:  11/19/2019 Echo personally reviewed EF normal, 65% RV normal size No significant valvular abnormalities  Tele strip review  Sinus rhythm with wide complex rhythm. PVCs later in the strip are slightly different morphology. Differential would include VT vs SVT such as flutter with aberrant conduction.  Tele today shows sinus with  frequent monomorphic PVCs.   Laboratory Data:  High Sensitivity Troponin:   Recent Labs  Lab 11/18/19 2200 11/19/19 0600  TROPONINIHS 96* 81*     Chemistry Recent Labs  Lab 11/19/19 1645 11/19/19 2348 11/20/19 0509  NA 133* 133* 133*  134*  K 4.2 4.6 4.0  4.0  CL 97* 99 98  98  CO2 22 22 24  23   GLUCOSE 105* 115* 112*  108*  BUN 28* 28* 25*  24*  CREATININE 3.84* 3.86* 3.28*  3.30*  CALCIUM 9.5 9.5 9.9  9.9  GFRNONAA 18* 18* 22*  22*  ANIONGAP 14 12 11  13     Recent Labs  Lab 11/17/19 1405 11/17/19 1405 11/18/19 0500 11/19/19 1645 11/20/19 0509  PROT 7.2  --  6.1*  --   --   ALBUMIN 3.4*   < > 2.8* 2.9* 2.9*  AST 26  --  17  --   --   ALT 8  --  10  --   --   ALKPHOS 129*  --  117  --   --   BILITOT 1.6*  --  2.1*  --   --    < > = values in this interval not displayed.   Hematology Recent Labs  Lab 11/18/19 0500 11/19/19 0600 11/20/19 0509  WBC 17.8* 20.2* 15.5*  RBC 2.85* 2.85* 2.89*  HGB 8.3* 8.4* 8.4*  HCT 26.6* 26.1* 25.3*  MCV 93.3 91.6 87.5  MCH 29.1 29.5 29.1  MCHC 31.2 32.2 33.2  RDW 16.7* 16.2* 16.2*  PLT 99* 102* 114*   BNPNo results for input(s): BNP, PROBNP in the last 168 hours.  DDimer No results for input(s): DDIMER in the last 168 hours.   Radiology/Studies:  CT Tibia Fibula Right Wo Contrast  Result Date: 11/17/2019 CLINICAL DATA:  Right lower extremity pain and swelling. History of laceration. EXAM: CT OF THE LOWER RIGHT EXTREMITY WITHOUT CONTRAST TECHNIQUE: Multidetector CT  imaging of the right lower extremity was performed according to the standard protocol. COMPARISON:  None. FINDINGS: Diffuse and marked subcutaneous soft tissue swelling/edema/fluid suggesting severe cellulitis. I do not see a discrete drainable fluid collection to suggest a soft tissue abscess. No findings suspicious for myofasciitis or pyomyositis. No knee or ankle joint effusion is identified. Severe vascular disease for age. No CT findings  suspicious for septic arthritis or osteomyelitis. IMPRESSION: 1. Diffuse and marked subcutaneous soft tissue swelling/edema/fluid suggesting severe cellulitis. No discrete drainable abscess. 2. No findings suspicious for myofasciitis or pyomyositis. 3. Severe vascular disease for age. 4. No findings suspicious for osteomyelitis or septic arthritis. Electronically Signed   By: Marijo Sanes M.D.   On: 11/17/2019 15:36   US Venous Img Lower Unilateral Right (DVT)  Result Date: 11/19/2019 CLINICAL DATA:  49 year old male with a history of edema EXAM: RIGHT LOWER EXTREMITY VENOUS DOPPLER ULTRASOUND TECHNIQUE: Gray-scale sonography with graded compression, as well as color Doppler and duplex ultrasound were performed to evaluate the lower extremity deep venous systems from the level of the common femoral vein and including the common femoral, femoral, profunda femoral, popliteal and calf veins including the posterior tibial, peroneal and gastrocnemius veins when visible. The superficial great saphenous vein was also interrogated. Spectral Doppler was utilized to evaluate flow at rest and with distal augmentation maneuvers in the common femoral, femoral and popliteal veins. COMPARISON:  None. FINDINGS: Contralateral Common Femoral Vein: Respiratory phasicity is normal and symmetric with the symptomatic side. No evidence of thrombus. Normal compressibility. Common Femoral Vein: No evidence of thrombus. Normal compressibility, respiratory phasicity and response to augmentation. Saphenofemoral Junction: No evidence of thrombus. Normal compressibility and flow on color Doppler imaging. Profunda Femoral Vein: No evidence of thrombus. Normal compressibility and flow on color Doppler imaging. Femoral Vein: No evidence of thrombus. Normal compressibility, respiratory phasicity and response to augmentation. Popliteal Vein: No evidence of thrombus. Normal compressibility, respiratory phasicity and response to augmentation.  Calf Veins: No evidence of thrombus. Normal compressibility and flow on color Doppler imaging. Superficial Great Saphenous Vein: No evidence of thrombus. Normal compressibility and flow on color Doppler imaging. Other Findings:  Edema IMPRESSION: Sonographic survey of the right lower extremity negative for DVT Edema right lower extremity Electronically Signed   By: Corrie Mckusick D.O.   On: 11/19/2019 10:14   DG Chest Port 1 View  Result Date: 11/19/2019 CLINICAL DATA:  Sepsis.  Hypotension.  Follow-up exam. EXAM: PORTABLE CHEST 1 VIEW COMPARISON:  11/17/2019 and older studies. FINDINGS: Stable cardiomegaly.  No mediastinal or hilar masses. Thickened interstitial markings. There is additional hazy opacity at the right lung base with the right hemidiaphragm partly obscured. Suspect a small pleural effusion. No pneumothorax. IMPRESSION: 1. Stable appearance from the most recent prior exam. 2. Cardiomegaly with interstitial thickening as well as a small right effusion. Possible mild degree congestive heart failure or fluid overload. No convincing pneumonia. Electronically Signed   By: Lajean Manes M.D.   On: 11/19/2019 06:29   DG Chest Port 1 View  Result Date: 11/17/2019 CLINICAL DATA:  Hypotension. EXAM: PORTABLE CHEST 1 VIEW COMPARISON:  July 01, 2017 FINDINGS: Mild to moderate severity diffuse chronic appearing increased interstitial lung markings are seen. Mild atelectasis is noted within the bilateral lung bases. There is a small, stable right pleural effusion. No pneumothorax is identified. The cardiac silhouette is markedly enlarged and unchanged in size. The visualized skeletal structures are unremarkable. IMPRESSION: 1. Diffuse chronic appearing increased interstitial lung markings. A small  superimposed component of interstitial edema cannot be excluded. 2. Mild bibasilar atelectasis. 3. Small right pleural effusion. Electronically Signed   By: Virgina Norfolk M.D.   On: 11/17/2019 20:41    ECHOCARDIOGRAM COMPLETE  Result Date: 11/19/2019    ECHOCARDIOGRAM REPORT   Patient Name:   ESTER MABE Date of Exam: 11/19/2019 Medical Rec #:  [id number]       Height:       74.0 in Accession #:    [id number]      Weight:       213.6 lb Date of Birth:  December 21, 1970        BSA:          2.235 m Patient Age:    88 years        BP:           121/57 mmHg Patient Gender: M               HR:           61 bpm. Exam Location:  [facility] Procedure: 2D Echo Indications:     Cardiomyopathy 425.9/ I42.9  History:         Patient has prior history of Echocardiogram examinations, most                  recent 02/07/2019.  Sonographer:     Arville Go RDCS Referring Phys:  [id number] [place] Diagnosing Phys: Yolonda Kida MD  Sonographer Comments: Image acquisition challenging due to patient behavioral factors. IMPRESSIONS  1. Left ventricular ejection fraction, by estimation, is 65 to 70%. Left ventricular ejection fraction by PLAX is 64 %. The left ventricle has normal function. The left ventricle has no regional wall motion abnormalities. Left ventricular diastolic function could not be evaluated. There is the interventricular septum is flattened in systole and diastole, consistent with right ventricular pressure and volume overload.  2. Right ventricular systolic function is normal. The right ventricular size is normal.  3. The mitral valve is grossly normal. No evidence of mitral valve regurgitation.  4. The aortic valve is normal in structure. Aortic valve regurgitation is not visualized. FINDINGS  Left Ventricle: Left ventricular ejection fraction, by estimation, is 65 to 70%. Left ventricular ejection fraction by PLAX is 64 %. The left ventricle has normal function. The left ventricle has no regional wall motion abnormalities. The left ventricular internal cavity size was normal in size. There is no left ventricular hypertrophy. The interventricular septum is flattened in systole and diastole,  consistent with right ventricular pressure and volume overload. Left ventricular diastolic function could not be evaluated. Right Ventricle: The right ventricular size is normal. No increase in right ventricular wall thickness. Right ventricular systolic function is normal. Left Atrium: Left atrial size was normal in size. Right Atrium: Right atrial size was normal in size. Pericardium: There is no evidence of pericardial effusion. Mitral Valve: The mitral valve is grossly normal. No evidence of mitral valve regurgitation. Tricuspid Valve: The tricuspid valve is normal in structure. Tricuspid valve regurgitation is trivial. Aortic Valve: The aortic valve is normal in structure. Aortic valve regurgitation is not visualized. Aortic regurgitation PHT measures 714 msec. Aortic valve mean gradient measures 16.0 mmHg. Aortic valve peak gradient measures 25.5 mmHg. Pulmonic Valve: The pulmonic valve was normal in structure. Pulmonic valve regurgitation is trivial. Aorta: The ascending aorta was not well visualized. IAS/Shunts: No atrial level shunt detected by color flow Doppler.  LEFT VENTRICLE PLAX 2D LV EF:  Left ventricular ejection fraction by PLAX is 64 %. LVIDd:         5.45 cm LVIDs:         3.53 cm LV PW:         1.55 cm LV IVS:        1.33 cm LVOT diam:     2.10 cm LVOT Area:     3.46 cm  LEFT ATRIUM         Index LA diam:    5.10 cm 2.28 cm/m  AORTIC VALVE AV Vmax:      252.50 cm/s AV Vmean:     195.000 cm/s AV VTI:       0.413 m AV Peak Grad: 25.5 mmHg    PULMONARY ARTERY AV Mean Grad: 16.0 mmHg    MPA diam:        3.30 cm AI PHT:       714 msec  AORTA Ao Root diam: 4.50 cm Ao Asc diam:  3.60 cm TRICUSPID VALVE TV Peak grad:   35.3 mmHg TV Vmax:        2.97 m/s  SHUNTS Systemic Diam: 2.10 cm Dwayne Prince [place] MD Electronically signed by Yolonda Kida MD Signature Date/Time: 11/19/2019/3:23:25 PM    Final      Assessment and Plan:   1. Wide complex tachycardia in the setting of severe  sepsis I suspect the episode of WCT (150bpm) was an outflow tract origin VT given he is having frequent PVCs originating in that area. An outflow tract PVC would also fit the clinical setting (PVCs progressing to sustained VT in the setting of acute illness; catacholiminergic surge).   Recommend continued supportive management for his sepsis and weaning of the pressors.  No need for antiarrhythmic therapy given a structurally normal heart and no additional sustained episodes.  Once he is hemodynamically stable, would like to start him on a low dose beta blocker but this can be done as an outpatient.  No need for ICD.   2. Sepsis Recommend weaning of pressors as you are doing.   For questions or updates, please contact [place] Please consult [email or web address] for contact info under    Signed, Vickie Epley, MD  11/20/2019 7:48 AM       ADDENDUM 11/21/19 9:47 PM   Referring provider: Dr Bretta Bang.

## 2019-11-21 LAB — RENAL FUNCTION PANEL
Albumin: 2.8 g/dL — ABNORMAL LOW (ref 3.5–5.0)
Albumin: 2.6 g/dL — ABNORMAL LOW (ref 3.5–5.0)
Anion gap: 10 (ref 5–15)
Anion gap: 10 (ref 5–15)
BUN: 28 mg/dL — ABNORMAL HIGH (ref 6–20)
BUN: 30 mg/dL — ABNORMAL HIGH (ref 6–20)
CO2: 25 mmol/L (ref 22–32)
CO2: 23 mmol/L (ref 22–32)
Calcium: 10.3 mg/dL (ref 8.9–10.3)
Calcium: 9.9 mg/dL (ref 8.9–10.3)
Chloride: 97 mmol/L — ABNORMAL LOW (ref 98–111)
Chloride: 99 mmol/L (ref 98–111)
Creatinine, Ser: 3.05 mg/dL — ABNORMAL HIGH (ref 0.61–1.24)
Creatinine, Ser: 2.74 mg/dL — ABNORMAL HIGH (ref 0.61–1.24)
GFR, Estimated: 24 mL/min — ABNORMAL LOW (ref >60)
GFR, Estimated: 27 mL/min — ABNORMAL LOW (ref >60)
Glucose, Bld: 112 mg/dL — ABNORMAL HIGH (ref 70–99)
Glucose, Bld: 128 mg/dL — ABNORMAL HIGH (ref 70–99)
Phosphorus: 3.2 mg/dL (ref 2.5–4.6)
Phosphorus: 2.3 mg/dL — ABNORMAL LOW (ref 2.5–4.6)
Potassium: 3.7 mmol/L (ref 3.5–5.1)
Potassium: 3.6 mmol/L (ref 3.5–5.1)
Sodium: 132 mmol/L — ABNORMAL LOW (ref 135–145)
Sodium: 132 mmol/L — ABNORMAL LOW (ref 135–145)

## 2019-11-21 LAB — CBC WITH DIFFERENTIAL/PLATELET
Abs Immature Granulocytes: 0.13 10*3/uL — ABNORMAL HIGH (ref 0.00–0.07)
Basophils Absolute: 0 10*3/uL (ref 0.0–0.1)
Basophils Relative: 0 %
Eosinophils Absolute: 0 10*3/uL (ref 0.0–0.5)
Eosinophils Relative: 0 %
HCT: 25.5 % — ABNORMAL LOW (ref 39.0–52.0)
Hemoglobin: 8.4 g/dL — ABNORMAL LOW (ref 13.0–17.0)
Immature Granulocytes: 1 %
Lymphocytes Relative: 3 %
Lymphs Abs: 0.6 10*3/uL — ABNORMAL LOW (ref 0.7–4.0)
MCH: 29.2 pg (ref 26.0–34.0)
MCHC: 32.9 g/dL (ref 30.0–36.0)
MCV: 88.5 fL (ref 80.0–100.0)
Monocytes Absolute: 1 10*3/uL (ref 0.1–1.0)
Monocytes Relative: 6 %
Neutro Abs: 15.2 10*3/uL — ABNORMAL HIGH (ref 1.7–7.7)
Neutrophils Relative %: 90 %
Platelets: 131 10*3/uL — ABNORMAL LOW (ref 150–400)
RBC: 2.88 MIL/uL — ABNORMAL LOW (ref 4.22–5.81)
RDW: 16 % — ABNORMAL HIGH (ref 11.5–15.5)
WBC: 17 10*3/uL — ABNORMAL HIGH (ref 4.0–10.5)
nRBC: 0.2 % (ref 0.0–0.2)

## 2019-11-21 LAB — BASIC METABOLIC PANEL
Anion gap: 10 (ref 5–15)
BUN: 28 mg/dL — ABNORMAL HIGH (ref 6–20)
CO2: 25 mmol/L (ref 22–32)
Calcium: 10.4 mg/dL — ABNORMAL HIGH (ref 8.9–10.3)
Chloride: 98 mmol/L (ref 98–111)
Creatinine, Ser: 2.97 mg/dL — ABNORMAL HIGH (ref 0.61–1.24)
GFR, Estimated: 25 mL/min — ABNORMAL LOW (ref >60)
Glucose, Bld: 112 mg/dL — ABNORMAL HIGH (ref 70–99)
Potassium: 3.8 mmol/L (ref 3.5–5.1)
Sodium: 133 mmol/L — ABNORMAL LOW (ref 135–145)

## 2019-11-21 LAB — PHOSPHORUS: Phosphorus: 3.1 mg/dL (ref 2.5–4.6)

## 2019-11-21 LAB — MAGNESIUM: Magnesium: 2.2 mg/dL (ref 1.7–2.4)

## 2019-11-21 NOTE — Progress Notes (Signed)
[name] visited pt. while rounding on ICU; pt. called [name] into rm. --> pt. confused re: time of day --> thought he'd just eaten dinner, but realized meal was, in fact, lunch.  Pt. requests to speak w/ICU MD --> request relayed to MD.  No further needs at this time.

## 2019-11-21 NOTE — Progress Notes (Addendum)
[facility]  ROUNDING NOTE   Subjective:   CRRT held last night due to staffing issues.  UF 1383mL.  Norepinephrine gtt. Off phenylephrine and vasopressin.   Still lethargic but able to answer questions.   Objective:  Vital signs in last 24 hours:  Temp:  [97.6 F (36.4 C)-98.3 F (36.8 C)] 97.6 F (36.4 C) (11/01 0800) Pulse Rate:  [42-84] 48 (11/01 1000) Resp:  [10-21] 13 (11/01 1000) BP: (96-148)/(47-81) 130/57 (11/01 1000) SpO2:  [80 %-99 %] 88 % (11/01 1000) Weight:  [96.9 kg] 96.9 kg (11/01 0314)  Weight change: 0 kg Filed Weights   11/19/19 0338 11/20/19 0500 11/21/19 0314  Weight: 96.9 kg 96.9 kg 96.9 kg    Intake/Output: I/O last 3 completed shifts: In: 2719.8 [P.O.:358; I.V.:1763.8; IV Piggyback:598.1] Out: 2776.1 [Other:2776.1]   Intake/Output this shift:  No intake/output data recorded.  Physical Exam: General: Critically ill  Head: Normocephalic, atraumatic. Moist oral mucosal membranes  Eyes: Anicteric, PERRL  Neck: Supple, trachea midline  Lungs:  Clear to auscultation  Heart: Irregular , bradycardia  Abdomen:  Soft, nontender  Extremities:  ++ peripheral edema. Right lower extremity induration  Neurologic: Waxing and waning   Skin: +induration  Access: Left AVF, left temp HD catheter ICU 19/37    Basic Metabolic Panel: Recent Labs  Lab 11/18/19 0500 11/18/19 2030 11/18/19 2200 11/19/19 0600 11/19/19 0600 11/19/19 1645 11/19/19 1645 11/19/19 2348 11/19/19 2348 11/20/19 0509 11/20/19 1610 11/21/19 0330  NA 136   < >  --  131*   < > 133*  --  133*  --  133*  134* 132* 133*  132*  K 4.5   < >  --  5.0   < > 4.2  --  4.6  --  4.0  4.0 3.5 3.8  3.7  CL 100   < >  --  96*   < > 97*  --  99  --  98  98 96* 98  97*  CO2 25   < >  --  25   < > 22  --  22  --  24  23 27 25  25   GLUCOSE 61*   < >  --  122*   < > 105*  --  115*  --  112*  108* 104* 112*  112*  BUN 25*   < >  --  34*   < > 28*  --  28*  --  25*  24* 21*  28*  28*  CREATININE 4.80*   < >  --  5.68*   < > 3.84*  --  3.86*  --  3.28*  3.30* 2.51* 2.97*  3.05*  CALCIUM 8.9   < >  --  9.0   < > 9.5   < > 9.5   < > 9.9  9.9 9.9 10.4*  10.3  MG 1.8  --  1.9 2.4  --   --   --   --   --  2.1  --  2.2  PHOS 3.8   < > 4.3 4.6  --  3.7  --   --   --  3.4  3.6 2.7 3.1  3.2   < > = values in this interval not displayed.    Liver Function Tests: Recent Labs  Lab 11/17/19 1405 11/17/19 1405 11/18/19 0500 11/19/19 1645 11/20/19 0509 11/20/19 1610 11/21/19 0330  AST 26  --  17  --   --   --   --  ALT 8  --  10  --   --   --   --   ALKPHOS 129*  --  117  --   --   --   --   BILITOT 1.6*  --  2.1*  --   --   --   --   PROT 7.2  --  6.1*  --   --   --   --   ALBUMIN 3.4*   < > 2.8* 2.9* 2.9* 2.7* 2.8*   < > = values in this interval not displayed.   No results for input(s): LIPASE, AMYLASE in the last 168 hours. No results for input(s): AMMONIA in the last 168 hours.  CBC: Recent Labs  Lab 11/17/19 1405 11/18/19 0500 11/19/19 0600 11/20/19 0509 11/21/19 0330  WBC 9.8 17.8* 20.2* 15.5* 17.0*  NEUTROABS 8.9* 15.1* 18.6* 14.1* 15.2*  HGB 8.9* 8.3* 8.4* 8.4* 8.4*  HCT 28.4* 26.6* 26.1* 25.3* 25.5*  MCV 93.4 93.3 91.6 87.5 88.5  PLT 117* 99* 102* 114* 131*    Cardiac Enzymes: Recent Labs  Lab 11/17/19 1405  CKTOTAL 136    BNP: Invalid input(s): POCBNP  CBG: Recent Labs  Lab 11/19/19 1540 11/19/19 2000 11/20/19 0004 11/20/19 0354 11/20/19 0727  GLUCAP 38 111* 108* 119* 109*    Microbiology: Results for orders placed or performed during the hospital encounter of 11/17/19  Wound or Superficial Culture     Status: Abnormal   Collection Time: 11/17/19  2:03 PM   Specimen: Wound  Result Value Ref Range Status   Specimen Description   Final    WOUND Performed at [facility], [address], [place], [place] [address]    Special Requests   Final    RL Performed at [facility], [address], [place], [place] [address]    Gram Stain   Final    NO WBC SEEN ABUNDANT GRAM NEGATIVE RODS FEW GRAM POSITIVE COCCI Performed at [facility], [place] [address] [place], [place] [address]    Culture MULTIPLE ORGANISMS PRESENT, NONE PREDOMINANT (A)  Final   Report Status 11/20/2019 FINAL  Final  Respiratory Panel by RT PCR (Flu A&B, Covid) - Nasopharyngeal Swab     Status: None   Collection Time: 11/17/19  2:05 PM   Specimen: Nasopharyngeal Swab  Result Value Ref Range Status   SARS Coronavirus 2 by RT PCR NEGATIVE NEGATIVE Final    Comment: (NOTE) SARS-CoV-2 target nucleic acids are NOT DETECTED.  The SARS-CoV-2 RNA is generally detectable in upper respiratoy specimens during the acute phase of infection. The lowest concentration of SARS-CoV-2 viral copies this assay can detect is 131 copies/mL. A negative result does not preclude SARS-Cov-2 infection and should not be used as the sole basis for treatment or other patient management decisions. A negative result may occur with  improper specimen collection/handling, submission of specimen other than nasopharyngeal swab, presence of viral mutation(s) within the areas targeted by this assay, and inadequate number of viral copies (<131 copies/mL). A negative result must be combined with clinical observations, patient history, and epidemiological information. The expected result is Negative.  Fact Sheet for Patients:  PinkCheek.be  Fact Sheet for Healthcare Providers:  GravelBags.it  This test is no t yet approved or cleared by the [place] FDA and  has been authorized for detection and/or diagnosis of SARS-CoV-2 by FDA under an Emergency Use Authorization (EUA). This EUA will remain  in effect (meaning this test  can be used) for the duration of the COVID-19 declaration under Section 564(b)(1) of the Act, 21 U.S.C. section 360bbb-3(b)(1), unless the authorization is  terminated or revoked sooner.     Influenza A by PCR NEGATIVE NEGATIVE Final   Influenza B by PCR NEGATIVE NEGATIVE Final    Comment: (NOTE) The Xpert Xpress SARS-CoV-2/FLU/RSV assay is intended as an aid in  the diagnosis of influenza from Nasopharyngeal swab specimens and  should not be used as a sole basis for treatment. Nasal washings and  aspirates are unacceptable for Xpert Xpress SARS-CoV-2/FLU/RSV  testing.  Fact Sheet for Patients: PinkCheek.be  Fact Sheet for Healthcare Providers: GravelBags.it  This test is not yet approved or cleared by the [place] FDA and  has been authorized for detection and/or diagnosis of SARS-CoV-2 by  FDA under an Emergency Use Authorization (EUA). This EUA will remain  in effect (meaning this test can be used) for the duration of the  Covid-19 declaration under Section 564(b)(1) of the Act, 21  U.S.C. section 360bbb-3(b)(1), unless the authorization is  terminated or revoked. Performed at [facility], [address], [place], [place] [address]   Blood Culture (routine x 2)     Status: None (Preliminary result)   Collection Time: 11/17/19  2:06 PM   Specimen: BLOOD  Result Value Ref Range Status   Specimen Description BLOOD BLOOD RIGHT HAND  Final   Special Requests   Final    BOTTLES DRAWN AEROBIC AND ANAEROBIC Blood Culture results may not be optimal due to an inadequate volume of blood received in culture bottles   Culture   Final    NO GROWTH 4 DAYS Performed at [facility], [address] [place], [place] [address]    Report Status PENDING  Incomplete  Blood Culture (routine x 2)     Status: None (Preliminary result)   Collection Time: 11/17/19  2:06 PM   Specimen: BLOOD  Result Value Ref Range Status   Specimen Description BLOOD BLOOD RIGHT HAND  Final   Special Requests   Final    BOTTLES DRAWN AEROBIC AND ANAEROBIC Blood Culture adequate  volume   Culture   Final    NO GROWTH 4 DAYS Performed at [facility], [address], [place], [place] [address]    Report Status PENDING  Incomplete  MRSA PCR Screening     Status: Abnormal   Collection Time: 11/18/19  1:03 AM   Specimen: Nasopharyngeal  Result Value Ref Range Status   MRSA by PCR POSITIVE (A) NEGATIVE Corrected    Comment:        The GeneXpert MRSA Assay (FDA approved for NASAL specimens only), is one component of a comprehensive MRSA colonization surveillance program. It is not intended to diagnose MRSA infection nor to guide or monitor treatment for MRSA infections. RESULT CALLED TO, READ BACK BY AND VERIFIED WITH: CYNTHIA VASQUEZ @0245  ON 11/18/19 SKL Performed at [facility], [address] [place], [place] [address] CORRECTED ON 10/29 AT 0249: PREVIOUSLY REPORTED AS POSITIVE        The GeneXpert MRSA Assay (FDA approved for NASAL specimens only), is one component of a comprehensive MRSA colonization surveillance program. It is not intended to diagnose MRSA infection  nor to guide or monitor treatment for MRSA infections. CYNTHIA VASQUEZ @0245  ON 11/18/19 SKL     Coagulation Studies: No results for input(s): LABPROT, INR in the last 72 hours.  Urinalysis: No results for input(s): COLORURINE, LABSPEC, [place],  GLUCOSEU, HGBUR, BILIRUBINUR, KETONESUR, PROTEINUR, UROBILINOGEN, NITRITE, LEUKOCYTESUR in the last 72 hours.  Invalid input(s): APPERANCEUR    Imaging: CT HEAD WO CONTRAST  Result Date: 11/20/2019 CLINICAL DATA:  Neuro deficit, acute. EXAM: CT HEAD WITHOUT CONTRAST TECHNIQUE: Contiguous axial images were obtained from the base of the skull through the vertex without intravenous contrast. COMPARISON:  None. FINDINGS: Brain: No acute infarct or intracranial hemorrhage. No mass lesion. No midline shift, ventriculomegaly or extra-axial fluid collection. Vascular: No hyperdense vessel or unexpected calcification. Bilateral  skull base atherosclerotic calcifications. Skull: Negative for fracture or focal lesion. Sinuses/Orbits: Normal orbits. Clear paranasal sinuses. No mastoid effusion. Other: None. IMPRESSION: No acute intracranial process. Electronically Signed   By: Primitivo Gauze M.D.   On: 11/20/2019 20:50   DG Chest Port 1 View  Result Date: 11/20/2019 CLINICAL DATA:  Shortness of breath EXAM: PORTABLE CHEST 1 VIEW COMPARISON:  November 19, 2019 FINDINGS: Stable cardiomegaly. Mild increased interstitial markings. More focal opacity in the right base. No pneumothorax. No other change. IMPRESSION: 1. Cardiomegaly and mild edema. 2. More focal opacity in the right base could represent developing pneumonia. Recommend attention on follow-up. Electronically Signed   By: Dorise Bullion III M.D   On: 11/20/2019 09:29   ECHOCARDIOGRAM COMPLETE  Result Date: 11/19/2019    ECHOCARDIOGRAM REPORT   Patient Name:   GATES JIVIDEN Date of Exam: 11/19/2019 Medical Rec #:  [id number]       Height:       74.0 in Accession #:    [id number]      Weight:       213.6 lb Date of Birth:  February 05, 1970        BSA:          2.235 m Patient Age:    49 years        BP:           121/57 mmHg Patient Gender: M               HR:           61 bpm. Exam Location:  [facility] Procedure: 2D Echo Indications:     Cardiomyopathy 425.9/ I42.9  History:         Patient has prior history of Echocardiogram examinations, most                  recent 02/07/2019.  Sonographer:     Arville Go RDCS Referring Phys:  [id number] [place] L [place] Diagnosing Phys: Yolonda Kida MD  Sonographer Comments: Image acquisition challenging due to patient behavioral factors. IMPRESSIONS  1. Left ventricular ejection fraction, by estimation, is 65 to 70%. Left ventricular ejection fraction by PLAX is 64 %. The left ventricle has normal function. The left ventricle has no regional wall motion abnormalities. Left ventricular diastolic function could not be evaluated. There  is the interventricular septum is flattened in systole and diastole, consistent with right ventricular pressure and volume overload.  2. Right ventricular systolic function is normal. The right ventricular size is normal.  3. The mitral valve is grossly normal. No evidence of mitral valve regurgitation.  4. The aortic valve is normal in structure. Aortic valve regurgitation is not visualized. FINDINGS  Left Ventricle: Left ventricular ejection fraction, by estimation, is 65 to 70%. Left ventricular ejection fraction by PLAX is 64 %. The left ventricle has normal function. The left ventricle has no regional wall motion abnormalities. The left ventricular internal cavity size was normal in  size. There is no left ventricular hypertrophy. The interventricular septum is flattened in systole and diastole, consistent with right ventricular pressure and volume overload. Left ventricular diastolic function could not be evaluated. Right Ventricle: The right ventricular size is normal. No increase in right ventricular wall thickness. Right ventricular systolic function is normal. Left Atrium: Left atrial size was normal in size. Right Atrium: Right atrial size was normal in size. Pericardium: There is no evidence of pericardial effusion. Mitral Valve: The mitral valve is grossly normal. No evidence of mitral valve regurgitation. Tricuspid Valve: The tricuspid valve is normal in structure. Tricuspid valve regurgitation is trivial. Aortic Valve: The aortic valve is normal in structure. Aortic valve regurgitation is not visualized. Aortic regurgitation PHT measures 714 msec. Aortic valve mean gradient measures 16.0 mmHg. Aortic valve peak gradient measures 25.5 mmHg. Pulmonic Valve: The pulmonic valve was normal in structure. Pulmonic valve regurgitation is trivial. Aorta: The ascending aorta was not well visualized. IAS/Shunts: No atrial level shunt detected by color flow Doppler.  LEFT VENTRICLE PLAX 2D LV EF:         Left  ventricular ejection fraction by PLAX is 64 %. LVIDd:         5.45 cm LVIDs:         3.53 cm LV PW:         1.55 cm LV IVS:        1.33 cm LVOT diam:     2.10 cm LVOT Area:     3.46 cm  LEFT ATRIUM         Index LA diam:    5.10 cm 2.28 cm/m  AORTIC VALVE AV Vmax:      252.50 cm/s AV Vmean:     195.000 cm/s AV VTI:       0.413 m AV Peak Grad: 25.5 mmHg    PULMONARY ARTERY AV Mean Grad: 16.0 mmHg    MPA diam:        3.30 cm AI PHT:       714 msec  AORTA Ao Root diam: 4.50 cm Ao Asc diam:  3.60 cm TRICUSPID VALVE TV Peak grad:   35.3 mmHg TV Vmax:        2.97 m/s  SHUNTS Systemic Diam: 2.10 cm Yolonda Kida MD Electronically signed by Yolonda Kida MD Signature Date/Time: 11/19/2019/3:23:25 PM    Final      Medications:   .  prismasol BGK 4/2.5 Stopped (11/20/19 1800)  .  prismasol BGK 4/2.5 Stopped (11/20/19 1800)  . sodium chloride    . sodium chloride 10 mL/hr at 11/21/19 0600  . ceFEPime (MAXIPIME) IV 2 g (11/21/19 1005)  . dextrose 30 mL/hr at 11/21/19 0600  . norepinephrine (LEVOPHED) Adult infusion 4 mcg/min (11/21/19 0819)  . phenylephrine (NEO-SYNEPHRINE) Adult infusion Stopped (11/19/19 1433)  . prismasol BGK 4/2.5 Stopped (11/20/19 1800)  . vasopressin Stopped (11/20/19 1113)   . aspirin EC  81 mg Oral Daily  . Chlorhexidine Gluconate Cloth  6 each Topical Daily  . collagenase   Topical Daily  . feeding supplement  237 mL Oral TID BM  . ferrous sulfate  325 mg Oral BID  . heparin  5,000 Units Subcutaneous Q8H  . hydrocortisone sod succinate (SOLU-CORTEF) inj  50 mg Intravenous Q6H  . mouth rinse  15 mL Mouth Rinse BID  . midodrine  20 mg Oral TID  . mupirocin ointment  1 application Nasal BID  . pantoprazole (PROTONIX) IV  40 mg Intravenous  Q12H  . sodium chloride flush  3 mL Intravenous Q12H   sodium chloride, acetaminophen **OR** acetaminophen, atropine, dextrose, heparin, heparin sodium (porcine), melatonin, morphine injection, sodium chloride, sodium chloride  flush  Assessment/ Plan:  [name] is a 49 y.o. black male with end stage renal disease on hemodialysis, history of kidney transplant, lupus nephritis, and hypotension who was admitted to [facility] on 11/17/2019 for Cellulitis of right lower extremity [L16.435] ESRD on hemodialysis ([place]) [N18.6, Z99.2] Hypotension [I95.9] Severe sepsis ([place]) [A41.9, R65.20]  [facility] M[facility] Left AVF 93kg   1. End stage renal disease: on hemodialysis. Last intermittent treatment was 10/28.  Hemodynamically unstable. CRRT from 10/30-10/31  -Restart continuous renal replacement therapy: CVVHDF 2K bath. Continue ultrafiltration  2. Sepsis/hypotension with right lower extremity cellulitis and leukocytosis. Blood cultures with no growth.  - stress dose hydrocortisone - broad spectrum antibiotics: cefepime and vancomycin - vasopressors: norepinephrine, vasopressine and phenylephrine.  - increase midodrine.   3. Anemia with chronic kidney disease: with thrombocytopenia.  EPO 20000 units given on 10/31 subcutaneous    LOS: 4 [name] 11/1/202111:50 AM

## 2019-11-21 NOTE — Consult Note (Signed)
Pharmacy Antibiotic Note  [name] is a 49 y.o. male admitted on 11/17/2019 with cellulitis. Pt presented with pain and swelling of right leg in area of recent wound. PMH ESRD on HD TTS and lupus. Pharmacy was consulted for cefepime dosing. Trialysis catheter placed for CRRT (unable to tolerate HD with pressor requirements) which was started 10/30, held overnight but restarted this morning  Plan: continue cefepime 2 grams IV every 12 hours  Height: 6\' 2"  (188 cm) Weight: 96.9 kg (213 lb 10 oz) IBW/kg (Calculated) : 82.2  Temp (24hrs), Avg:98 F (36.7 C), Min:97.6 F (36.4 C), Max:98.3 F (36.8 C)  Recent Labs  Lab 11/17/19 1405 11/17/19 1405 11/17/19 2000 11/18/19 0103 11/18/19 0500 11/18/19 0500 11/18/19 2030 11/18/19 2030 11/19/19 0600 11/19/19 0600 11/19/19 1645 11/19/19 2348 11/20/19 0509 11/20/19 1610 11/21/19 0330  WBC 9.8  --   --   --  17.8*  --   --   --  20.2*  --   --   --  15.5*  --  17.0*  CREATININE 4.17*   < >  --   --  4.80*   < > 5.32*   < > 5.68*   < > 3.84* 3.86* 3.28*  3.30* 2.51* 2.97*  3.05*  LATICACIDVEN 3.2*  3.3*  --  3.6* 2.6*  --   --  1.6  --   --   --   --   --   --   --   --    < > = values in this interval not displayed.    Estimated Creatinine Clearance: 34.6 mL/min (A) (by C-G formula based on SCr of 2.97 mg/dL (H)).    Allergies  Allergen Reactions  . Ciprofloxacin Rash  . Sulfa Antibiotics Rash    Antimicrobials this admission: 10/27 ceftriaxone x 1 10/29 clindamycin x 3 10/28 vancomycin >> 11/1  10/29 cefepime >>   Microbiology results: 10/28 BCx: NG x 4 days 10/28 Wound: multiple species, none predominant 10/29 MRSA PCR: positive 10/28 SARS CoV-2: negative 10/28 influenza A/B: negative  Thank you for allowing pharmacy to be a part of this patient's care.  Dallie Piles, PharmD Clinical Pharmacist 11/21/2019 9:35 AM

## 2019-11-21 NOTE — Progress Notes (Signed)
CRRT restarted at 0802, without complications.

## 2019-11-21 NOTE — Plan of Care (Signed)
Discussed with plan of care for the evening, pain management and CT scan with no evidence of learning at this time.  Patient medicated with pain management prior to transport

## 2019-11-22 DIAGNOSIS — I480 Paroxysmal atrial fibrillation: Secondary | ICD-10-CM | POA: Diagnosis not present

## 2019-11-22 DIAGNOSIS — A419 Sepsis, unspecified organism: Secondary | ICD-10-CM

## 2019-11-22 DIAGNOSIS — R6521 Severe sepsis with septic shock: Secondary | ICD-10-CM

## 2019-11-22 DIAGNOSIS — R079 Chest pain, unspecified: Secondary | ICD-10-CM

## 2019-11-22 DIAGNOSIS — I472 Ventricular tachycardia: Secondary | ICD-10-CM | POA: Diagnosis not present

## 2019-11-22 LAB — RENAL FUNCTION PANEL
Albumin: 2.8 g/dL — ABNORMAL LOW (ref 3.5–5.0)
Albumin: 2.6 g/dL — ABNORMAL LOW (ref 3.5–5.0)
Anion gap: 11 (ref 5–15)
Anion gap: 12 (ref 5–15)
BUN: 24 mg/dL — ABNORMAL HIGH (ref 6–20)
BUN: 21 mg/dL — ABNORMAL HIGH (ref 6–20)
CO2: 24 mmol/L (ref 22–32)
CO2: 24 mmol/L (ref 22–32)
Calcium: 9.9 mg/dL (ref 8.9–10.3)
Calcium: 9.6 mg/dL (ref 8.9–10.3)
Chloride: 98 mmol/L (ref 98–111)
Chloride: 99 mmol/L (ref 98–111)
Creatinine, Ser: 2.26 mg/dL — ABNORMAL HIGH (ref 0.61–1.24)
Creatinine, Ser: 1.81 mg/dL — ABNORMAL HIGH (ref 0.61–1.24)
GFR, Estimated: 34 mL/min — ABNORMAL LOW (ref >60)
GFR, Estimated: 45 mL/min — ABNORMAL LOW (ref >60)
Glucose, Bld: 119 mg/dL — ABNORMAL HIGH (ref 70–99)
Glucose, Bld: 125 mg/dL — ABNORMAL HIGH (ref 70–99)
Phosphorus: 1.7 mg/dL — ABNORMAL LOW (ref 2.5–4.6)
Phosphorus: 1.6 mg/dL — ABNORMAL LOW (ref 2.5–4.6)
Potassium: 3.6 mmol/L (ref 3.5–5.1)
Potassium: 3.9 mmol/L (ref 3.5–5.1)
Sodium: 133 mmol/L — ABNORMAL LOW (ref 135–145)
Sodium: 135 mmol/L (ref 135–145)

## 2019-11-22 LAB — BASIC METABOLIC PANEL
Anion gap: 11 (ref 5–15)
BUN: 24 mg/dL — ABNORMAL HIGH (ref 6–20)
CO2: 23 mmol/L (ref 22–32)
Calcium: 9.9 mg/dL (ref 8.9–10.3)
Chloride: 99 mmol/L (ref 98–111)
Creatinine, Ser: 2.17 mg/dL — ABNORMAL HIGH (ref 0.61–1.24)
GFR, Estimated: 36 mL/min — ABNORMAL LOW (ref >60)
Glucose, Bld: 118 mg/dL — ABNORMAL HIGH (ref 70–99)
Potassium: 3.6 mmol/L (ref 3.5–5.1)
Sodium: 133 mmol/L — ABNORMAL LOW (ref 135–145)

## 2019-11-22 LAB — CBC WITH DIFFERENTIAL/PLATELET
Abs Immature Granulocytes: 0.28 10*3/uL — ABNORMAL HIGH (ref 0.00–0.07)
Basophils Absolute: 0 10*3/uL (ref 0.0–0.1)
Basophils Relative: 0 %
Eosinophils Absolute: 0 10*3/uL (ref 0.0–0.5)
Eosinophils Relative: 0 %
HCT: 24.9 % — ABNORMAL LOW (ref 39.0–52.0)
Hemoglobin: 8.3 g/dL — ABNORMAL LOW (ref 13.0–17.0)
Immature Granulocytes: 2 %
Lymphocytes Relative: 6 %
Lymphs Abs: 0.8 10*3/uL (ref 0.7–4.0)
MCH: 29.1 pg (ref 26.0–34.0)
MCHC: 33.3 g/dL (ref 30.0–36.0)
MCV: 87.4 fL (ref 80.0–100.0)
Monocytes Absolute: 0.9 10*3/uL (ref 0.1–1.0)
Monocytes Relative: 7 %
Neutro Abs: 10.8 10*3/uL — ABNORMAL HIGH (ref 1.7–7.7)
Neutrophils Relative %: 85 %
Platelets: 104 10*3/uL — ABNORMAL LOW (ref 150–400)
RBC: 2.85 MIL/uL — ABNORMAL LOW (ref 4.22–5.81)
RDW: 16.1 % — ABNORMAL HIGH (ref 11.5–15.5)
WBC: 12.8 10*3/uL — ABNORMAL HIGH (ref 4.0–10.5)
nRBC: 0.4 % — ABNORMAL HIGH (ref 0.0–0.2)

## 2019-11-22 LAB — CULTURE, BLOOD (ROUTINE X 2)
Culture: NO GROWTH
Culture: NO GROWTH
Special Requests: ADEQUATE

## 2019-11-22 LAB — TROPONIN I (HIGH SENSITIVITY)
Troponin I (High Sensitivity): 24 ng/L — ABNORMAL HIGH (ref <18)
Troponin I (High Sensitivity): 24 ng/L — ABNORMAL HIGH (ref <18)

## 2019-11-22 LAB — PHOSPHORUS: Phosphorus: 1.7 mg/dL — ABNORMAL LOW (ref 2.5–4.6)

## 2019-11-22 LAB — MAGNESIUM: Magnesium: 2.6 mg/dL — ABNORMAL HIGH (ref 1.7–2.4)

## 2019-11-22 MED ORDER — MORPHINE SULFATE (PF) 2 MG/ML IV SOLN
2.0000 mg | INTRAVENOUS | Status: DC | PRN
  Administered 2019-11-22 (×3): 2 mg via INTRAVENOUS
  Filled 2019-11-22 (×3): qty 1

## 2019-11-22 MED ORDER — MORPHINE SULFATE (PF) 2 MG/ML IV SOLN
INTRAVENOUS | Status: AC
  Administered 2019-11-22: 2 mg via INTRAVENOUS
  Filled 2019-11-22: qty 1

## 2019-11-22 MED ORDER — MORPHINE SULFATE (PF) 2 MG/ML IV SOLN
2.0000 mg | INTRAVENOUS | Status: DC | PRN
  Administered 2019-11-22 – 2019-11-23 (×4): 2 mg via INTRAVENOUS
  Filled 2019-11-22 (×4): qty 1

## 2019-11-22 MED ORDER — K PHOS MONO-SOD PHOS DI & MONO 155-852-130 MG PO TABS
500.0000 mg | ORAL_TABLET | ORAL | Status: AC
  Administered 2019-11-22 (×3): 500 mg via ORAL
  Filled 2019-11-22 (×3): qty 2

## 2019-11-22 MED ORDER — CEFAZOLIN SODIUM-DEXTROSE 2-4 GM/100ML-% IV SOLN
2.0000 g | Freq: Two times a day (BID) | INTRAVENOUS | Status: DC
  Administered 2019-11-22: 2 g via INTRAVENOUS
  Filled 2019-11-22 (×3): qty 100

## 2019-11-22 MED ORDER — HYDROCODONE-ACETAMINOPHEN 10-325 MG PO TABS
1.0000 | ORAL_TABLET | Freq: Three times a day (TID) | ORAL | Status: DC | PRN
  Administered 2019-11-22 – 2019-11-27 (×11): 1 via ORAL
  Filled 2019-11-22 (×12): qty 1

## 2019-11-22 NOTE — Progress Notes (Signed)
CRITICAL CARE NOTE  CC  follow up septic shock ESRD SUBJECTIVE  occasionally is able to answer any questions he denies any significant complaints he denies any chest pain shortness of breath cough abdominal pain nausea or vomiting.  He has not been eating well.   Continues to be on CRRT with 50 mL/h fluid removal Off pressors      SIGNIFICANT EVENTS Bradycardia 11/1 Off pressors   BP (!) 109/56   Pulse (!) 31   Temp 97.9 F (36.6 C) (Oral)   Resp 10   Ht 6\' 2"  (1.88 m)   Wt 99.1 kg   SpO2 93%   BMI 28.05 kg/m    REVIEW OF SYSTEMS  PATIENT IS UNABLE TO PROVIDE COMPLETE REVIEW OF SYSTEMS DUE TO confusion PHYSICAL EXAMINATION:  GENERAL: no resp distress NECK: Supple. No thyromegaly. No nodules. No JVD.  PULMONARY: No wheezing or rhonchi CARDIOVASCULAR: S1 and S2. Regular rate and rhythm.  Bradycardic.  No murmurs, rubs, or gallops.  GASTROINTESTINAL: Soft, nontender, not distended. No masses. Positive bowel sounds. No hepatosplenomegaly.  MUSCULOSKELETAL: +swelling RT leg NEUROLOGIC: No focal deficits but confused intermittently. Right lower extremity wound dressed. INTAKE/OUTPUT  Intake/Output Summary (Last 24 hours) at 11/22/2019 1059 Last data filed at 11/22/2019 1000 Gross per 24 hour  Intake 1981.29 ml  Output 3021 ml  Net -1039.71 ml    LABS  CBC Recent Labs  Lab 11/20/19 0509 11/21/19 0330 11/22/19 0522  WBC 15.5* 17.0* 12.8*  HGB 8.4* 8.4* 8.3*  HCT 25.3* 25.5* 24.9*  PLT 114* 131* 104*   Coag's Recent Labs  Lab 11/17/19 1405 11/18/19 0500  APTT 39*  --   INR 1.2 1.8*   BMET Recent Labs  Lab 11/21/19 0330 11/21/19 1709 11/22/19 0522  NA 133*  132* 132* 133*  133*  K 3.8  3.7 3.6 3.6  3.6  CL 98  97* 99 99  98  CO2 25  25 23 23  24   BUN 28*  28* 30* 24*  24*  CREATININE 2.97*  3.05* 2.74* 2.17*  2.26*  GLUCOSE 112*  112* 128* 118*  119*   Electrolytes Recent Labs  Lab 11/20/19 0509 11/20/19 1610  11/21/19 0330 11/21/19 1709 11/22/19 0522  CALCIUM 9.9  9.9   < > 10.4*  10.3 9.9 9.9  9.9  MG 2.1  --  2.2  --  2.6*  PHOS 3.4  3.6   < > 3.1  3.2 2.3* 1.7*  1.7*   < > = values in this interval not displayed.   Sepsis Markers Recent Labs  Lab 11/17/19 2000 11/18/19 0103 11/18/19 0500 11/18/19 2030 11/19/19 0600  LATICACIDVEN 3.6* 2.6*  --  1.6  --   PROCALCITON  --  30.72 39.28  --  50.67   ABG Recent Labs  Lab 11/18/19 1056  PHART 7.36  PCO2ART 44  PO2ART 91   Liver Enzymes Recent Labs  Lab 11/17/19 1405 11/17/19 1405 11/18/19 0500 11/19/19 1645 11/21/19 0330 11/21/19 1709 11/22/19 0522  AST 26  --  17  --   --   --   --   ALT 8  --  10  --   --   --   --   ALKPHOS 129*  --  117  --   --   --   --   BILITOT 1.6*  --  2.1*  --   --   --   --   ALBUMIN 3.4*   < >  2.8*   < > 2.8* 2.6* 2.8*   < > = values in this interval not displayed.   Cardiac Enzymes No results for input(s): TROPONINI, PROBNP in the last 168 hours. Glucose Recent Labs  Lab 11/19/19 1122 11/19/19 1540 11/19/19 2000 11/20/19 0004 11/20/19 0354 11/20/19 0727  GLUCAP 110* 84 111* 108* 119* 109*     Recent Results (from the past 240 hour(s))  Wound or Superficial Culture     Status: Abnormal   Collection Time: 11/17/19  2:03 PM   Specimen: Wound  Result Value Ref Range Status   Specimen Description   Final    WOUND Performed at [facility], [address] [place], [place] [address]    Special Requests   Final    RL Performed at [facility], [address] [place], [place] [address]    Gram Stain   Final    NO WBC SEEN ABUNDANT GRAM NEGATIVE RODS FEW GRAM POSITIVE COCCI Performed at [facility], [place] [address] [place], [place] [address]    Culture MULTIPLE ORGANISMS PRESENT, NONE PREDOMINANT (A)  Final   Report Status 11/20/2019 FINAL  Final  Respiratory Panel by RT PCR (Flu A&B, Covid) - Nasopharyngeal Swab     Status: None    Collection Time: 11/17/19  2:05 PM   Specimen: Nasopharyngeal Swab  Result Value Ref Range Status   SARS Coronavirus 2 by RT PCR NEGATIVE NEGATIVE Final    Comment: (NOTE) SARS-CoV-2 target nucleic acids are NOT DETECTED.  The SARS-CoV-2 RNA is generally detectable in upper respiratoy specimens during the acute phase of infection. The lowest concentration of SARS-CoV-2 viral copies this assay can detect is 131 copies/mL. A negative result does not preclude SARS-Cov-2 infection and should not be used as the sole basis for treatment or other patient management decisions. A negative result may occur with  improper specimen collection/handling, submission of specimen other than nasopharyngeal swab, presence of viral mutation(s) within the areas targeted by this assay, and inadequate number of viral copies (<131 copies/mL). A negative result must be combined with clinical observations, patient history, and epidemiological information. The expected result is Negative.  Fact Sheet for Patients:  PinkCheek.be  Fact Sheet for Healthcare Providers:  GravelBags.it  This test is no t yet approved or cleared by the [place] FDA and  has been authorized for detection and/or diagnosis of SARS-CoV-2 by FDA under an Emergency Use Authorization (EUA). This EUA will remain  in effect (meaning this test can be used) for the duration of the COVID-19 declaration under Section 564(b)(1) of the Act, 21 U.S.C. section 360bbb-3(b)(1), unless the authorization is terminated or revoked sooner.     Influenza A by PCR NEGATIVE NEGATIVE Final   Influenza B by PCR NEGATIVE NEGATIVE Final    Comment: (NOTE) The Xpert Xpress SARS-CoV-2/FLU/RSV assay is intended as an aid in  the diagnosis of influenza from Nasopharyngeal swab specimens and  should not be used as a sole basis for treatment. Nasal washings and  aspirates are unacceptable for Xpert  Xpress SARS-CoV-2/FLU/RSV  testing.  Fact Sheet for Patients: PinkCheek.be  Fact Sheet for Healthcare Providers: GravelBags.it  This test is not yet approved or cleared by the [place] FDA and  has been authorized for detection and/or diagnosis of SARS-CoV-2 by  FDA under an Emergency Use Authorization (EUA). This EUA will remain  in effect (meaning this test can be used) for the duration of the  Covid-19 declaration under Section  564(b)(1) of the Act, 21  U.S.C. section 360bbb-3(b)(1), unless the authorization is  terminated or revoked. Performed at [facility], [address] [place], [place] [address]   Blood Culture (routine x 2)     Status: None   Collection Time: 11/17/19  2:06 PM   Specimen: BLOOD  Result Value Ref Range Status   Specimen Description BLOOD BLOOD RIGHT HAND  Final   Special Requests   Final    BOTTLES DRAWN AEROBIC AND ANAEROBIC Blood Culture results may not be optimal due to an inadequate volume of blood received in culture bottles   Culture   Final    NO GROWTH 5 DAYS Performed at [facility], [address] [place], [place] [address]    Report Status 11/22/2019 FINAL  Final  Blood Culture (routine x 2)     Status: None   Collection Time: 11/17/19  2:06 PM   Specimen: BLOOD  Result Value Ref Range Status   Specimen Description BLOOD BLOOD RIGHT HAND  Final   Special Requests   Final    BOTTLES DRAWN AEROBIC AND ANAEROBIC Blood Culture adequate volume   Culture   Final    NO GROWTH 5 DAYS Performed at [facility], [address], [place], [place] [address]    Report Status 11/22/2019 FINAL  Final  MRSA PCR Screening     Status: Abnormal   Collection Time: 11/18/19  1:03 AM   Specimen: Nasopharyngeal  Result Value Ref Range Status   MRSA by PCR POSITIVE (A) NEGATIVE Corrected    Comment:        The GeneXpert MRSA Assay (FDA approved for NASAL  specimens only), is one component of a comprehensive MRSA colonization surveillance program. It is not intended to diagnose MRSA infection nor to guide or monitor treatment for MRSA infections. RESULT CALLED TO, READ BACK BY AND VERIFIED WITH: CYNTHIA VASQUEZ @0245  ON 11/18/19 SKL Performed at [facility], [address] [place], [place] [address] CORRECTED ON 10/29 AT 0249: PREVIOUSLY REPORTED AS POSITIVE        The GeneXpert MRSA Assay (FDA approved for NASAL specimens only), is one component of a comprehensive MRSA colonization surveillance program. It is not intended to diagnose MRSA infection  nor to guide or monitor treatment for MRSA infections. CYNTHIA VASQUEZ @0245  ON 11/18/19 SKL     MEDICATIONS   Current Facility-Administered Medications:  .   prismasol BGK 4/2.5 infusion, , CRRT, Continuous, Kolluru, Sarath, MD, Last Rate: 500 mL/hr at 11/22/19 0756, New Bag at 11/22/19 0756 .   prismasol BGK 4/2.5 infusion, , CRRT, Continuous, Kolluru, Sarath, MD, Last Rate: 500 mL/hr at 11/22/19 0756, New Bag at 11/22/19 0756 .  0.9 %  sodium chloride infusion, 250 mL, Intravenous, PRN, Howerter, Justin B, DO .  0.9 %  sodium chloride infusion, 250 mL, Intravenous, Continuous, Ouma, Bing Neighbors, NP, Last Rate: 10 mL/hr at 11/22/19 1000, Rate Verify at 11/22/19 1000 .  acetaminophen (TYLENOL) tablet 650 mg, 650 mg, Oral, Q6H PRN **OR** acetaminophen (TYLENOL) suppository 650 mg, 650 mg, Rectal, Q6H PRN, Howerter, Justin B, DO .  aspirin EC tablet 81 mg, 81 mg, Oral, Daily, Howerter, Justin B, DO, 81 mg at 11/22/19 1005 .  atropine 1 MG/10ML injection 1 mg, 1 mg, Intravenous, Once PRN, Rust-Chester, Britton L, NP .  ceFEPIme (MAXIPIME) 2 g in sodium chloride 0.9 % 100 mL IVPB, 2 g, Intravenous, Q12H, Dallie Piles, RPH, Last Rate: 200 mL/hr at 11/22/19  1009, 2 g at 11/22/19 1009 .  Chlorhexidine Gluconate Cloth 2 % PADS 6 each, 6 each, Topical, Daily, Darel Hong D, NP,  6 each at 11/21/19 1007 .  collagenase (SANTYL) ointment, , Topical, Daily, Rosine Door, MD, 1 application at 14/97/02 1006 .  dextrose 10 % infusion, , Intravenous, Continuous, Rust-Chester, Britton L, NP, Last Rate: 30 mL/hr at 11/22/19 1000, Rate Verify at 11/22/19 1000 .  dextrose 50 % solution 50 mL, 1 ampule, Intravenous, PRN, Howerter, Justin B, DO, 50 mL at 11/18/19 1741 .  feeding supplement (ENSURE ENLIVE / ENSURE PLUS) liquid 237 mL, 237 mL, Oral, TID BM, Rosine Door, MD, 237 mL at 11/21/19 2027 .  ferrous sulfate tablet 325 mg, 325 mg, Oral, BID, Howerter, Justin B, DO, 325 mg at 11/22/19 0719 .  heparin injection 1,000-6,000 Units, 1,000-6,000 Units, CRRT, PRN, Kolluru, Sarath, MD, 3,600 Units at 11/20/19 1814 .  heparin injection 5,000 Units, 5,000 Units, Subcutaneous, Q8H, Howerter, Justin B, DO, 5,000 Units at 11/22/19 0521 .  heparin sodium (porcine) injection 1,000 Units, 1,000 Units, Intravenous, PRN, Darel Hong D, NP, 1,000 Units at 11/18/19 0657 .  hydrocortisone sodium succinate (SOLU-CORTEF) 100 MG injection 50 mg, 50 mg, Intravenous, Q6H, Rust-Chester, Britton L, NP, 50 mg at 11/22/19 0841 .  MEDLINE mouth rinse, 15 mL, Mouth Rinse, BID, Darel Hong D, NP, 15 mL at 11/22/19 1006 .  melatonin tablet 2.5 mg, 2.5 mg, Oral, QHS PRN, Benita Gutter, RPH, 2.5 mg at 11/21/19 2115 .  midodrine (PROAMATINE) tablet 20 mg, 20 mg, Oral, TID, Kolluru, Sarath, MD, 20 mg at 11/22/19 1005 .  morphine 2 MG/ML injection 1 mg, 1 mg, Intravenous, Q4H PRN, Darel Hong D, NP, 1 mg at 11/22/19 0521 .  morphine 2 MG/ML injection 2 mg, 2 mg, Intravenous, Q30 min PRN, Flora Lipps, MD, 2 mg at 11/22/19 1005 .  mupirocin ointment (BACTROBAN) 2 % 1 application, 1 application, Nasal, BID, Darel Hong D, NP, 1 application at 63/78/58 1006 .  pantoprazole (PROTONIX) injection 40 mg, 40 mg, Intravenous, Q12H, Rust-Chester, Britton L, NP, 40 mg at 11/22/19 1005 .  prismasol BGK 4/2.5  infusion, , CRRT, Continuous, Kolluru, Sarath, MD, Last Rate: 2,000 mL/hr at 11/22/19 1026, New Bag at 11/22/19 1026 .  sodium chloride 0.9 % primer fluid for CRRT, , CRRT, PRN, Kolluru, Sarath, MD .  sodium chloride flush (NS) 0.9 % injection 3 mL, 3 mL, Intravenous, Q12H, Howerter, Justin B, DO, 3 mL at 11/21/19 2143 .  sodium chloride flush (NS) 0.9 % injection 3 mL, 3 mL, Intravenous, PRN, Howerter, Justin B, DO     Left groin dialysis catheter    ASSESSMENT AND PLAN SYNOPSIS  1.Septic Shock with hypotension in the setting of Severe Cellulitis of the Right Lower Extremity Weaned off pressors  2.Blood pressure is getting better Midodrine started by renal. CT of the RLE on 11/17/19 showing evidence of subcutaneous edema consistent with cellulitis in the absence of any evidence of drainable abscess or osteomyelitis - monitor WBC/ fever curve - Wound showed multiple organisms without a  dominant growth continue  Cefepime Afibrile,wbc trending down   3. wide QRS V-tach in 150's. 10/30 Adenosine & amio bolus given, pt ultimately cardioverted -Off amiodarone now due to bradycardia.  Cardiology suspects wide-complex tachycardia in the setting of sepsis and suspect an episode of WCT due to an outflow tract region VT.  No antiarrhythmic therapy has been recommended.  Once  stable they would like to start him  on low-dose beta-blockers but this can be done as an outpatient.  No need for ICD as per cardiology       4.ESRD on HD as out pt On CRRT   5.Encephalopathy metabolic without any motor deficits.  Likely related to renal failure hypotension and sepsis.   Anemia of Chronic Disease EPO as per renal  Hypoglycemia,reolve follow ICU hypo/hyperglycemia protocol Wean off d10    Transfer to [facility] tomorrow     Corrin Parker, M.D.  [facility]  Medical Director [place] Director [place] Department

## 2019-11-22 NOTE — Plan of Care (Signed)
Patient has remained off levophed this shift, now off all pressors, maintaining MAP greater than 65. Has been SB with BBB, frequent PVCs and PACs.   Pain medication switched to home PO regimen with IV morphine for breakthrough pain, pain better controlled. Mentation also seems to have improved with more adequate pain control. At end of shift, pt alert and oriented x4, however still calls out frequently.   CRRT rinsed back at 1800 without difficulty. Per Dr. Juleen China, plan is to attempt IHD tomorrow if patient's vital signs remain stable.

## 2019-11-22 NOTE — Progress Notes (Signed)
[facility]  ROUNDING NOTE   Subjective:   CRRT UF 2458mL. Weaned off norepinephrine, phenylephrine and vasopressin.   Still lethargic but able to answer questions.   irregular bradycardia  Objective:  Vital signs in last 24 hours:  Temp:  [97.7 F (36.5 C)-98.1 F (36.7 C)] 97.9 F (36.6 C) (11/02 0724) Pulse Rate:  [31-91] 31 (11/02 1000) Resp:  [6-26] 10 (11/02 1000) BP: (99-140)/(32-76) 109/56 (11/02 1000) SpO2:  [65 %-100 %] 93 % (11/02 1000) Weight:  [99.1 kg] 99.1 kg (11/02 0246)  Weight change: 2.2 kg Filed Weights   11/20/19 0500 11/21/19 0314 11/22/19 0246  Weight: 96.9 kg 96.9 kg 99.1 kg    Intake/Output: I/O last 3 completed shifts: In: 2528.5 [P.O.:443; I.V.:1587.6; IV Piggyback:497.9] Out: 2417 [Other:2417]   Intake/Output this shift:  Total I/O In: 510 [P.O.:355; I.V.:155] Out: 784 [Other:784]  Physical Exam: General: Critically ill  Head: Normocephalic, atraumatic. Moist oral mucosal membranes  Eyes: Anicteric, PERRL  Neck: Supple, trachea midline  Lungs:  Clear to auscultation  Heart: Irregular , bradycardia  Abdomen:  Soft, nontender  Extremities:  ++ peripheral edema. Right lower extremity induration  Neurologic: Waxing and waning   Skin: +induration  Access: Left AVF, left temp HD catheter ICU 62/95    Basic Metabolic Panel: Recent Labs  Lab 11/18/19 2030 11/18/19 2200 11/19/19 0600 11/19/19 1645 11/20/19 0509 11/20/19 0509 11/20/19 1610 11/20/19 1610 11/21/19 0330 11/21/19 1709 11/22/19 0522  NA   < >  --  131*   < > 133*  134*  --  132*  --  133*  132* 132* 133*  133*  K   < >  --  5.0   < > 4.0  4.0  --  3.5  --  3.8  3.7 3.6 3.6  3.6  CL   < >  --  96*   < > 98  98  --  96*  --  98  97* 99 99  98  CO2   < >  --  25   < > 24  23  --  27  --  25  25 23 23  24   GLUCOSE   < >  --  122*   < > 112*  108*  --  104*  --  112*  112* 128* 118*  119*  BUN   < >  --  34*   < > 25*  24*  --  21*  --  28*   28* 30* 24*  24*  CREATININE   < >  --  5.68*   < > 3.28*  3.30*  --  2.51*  --  2.97*  3.05* 2.74* 2.17*  2.26*  CALCIUM   < >  --  9.0   < > 9.9  9.9   < > 9.9   < > 10.4*  10.3 9.9 9.9  9.9  MG  --  1.9 2.4  --  2.1  --   --   --  2.2  --  2.6*  PHOS   < > 4.3 4.6   < > 3.4  3.6  --  2.7  --  3.1  3.2 2.3* 1.7*  1.7*   < > = values in this interval not displayed.    Liver Function Tests: Recent Labs  Lab 11/17/19 1405 11/17/19 1405 11/18/19 0500 11/19/19 1645 11/20/19 0509 11/20/19 1610 11/21/19 0330 11/21/19 1709 11/22/19 0522  AST 26  --  17  --   --   --   --   --   --  ALT 8  --  10  --   --   --   --   --   --   ALKPHOS 129*  --  117  --   --   --   --   --   --   BILITOT 1.6*  --  2.1*  --   --   --   --   --   --   PROT 7.2  --  6.1*  --   --   --   --   --   --   ALBUMIN 3.4*   < > 2.8*   < > 2.9* 2.7* 2.8* 2.6* 2.8*   < > = values in this interval not displayed.   No results for input(s): LIPASE, AMYLASE in the last 168 hours. No results for input(s): AMMONIA in the last 168 hours.  CBC: Recent Labs  Lab 11/18/19 0500 11/19/19 0600 11/20/19 0509 11/21/19 0330 11/22/19 0522  WBC 17.8* 20.2* 15.5* 17.0* 12.8*  NEUTROABS 15.1* 18.6* 14.1* 15.2* 10.8*  HGB 8.3* 8.4* 8.4* 8.4* 8.3*  HCT 26.6* 26.1* 25.3* 25.5* 24.9*  MCV 93.3 91.6 87.5 88.5 87.4  PLT 99* 102* 114* 131* 104*    Cardiac Enzymes: Recent Labs  Lab 11/17/19 1405  CKTOTAL 136    BNP: Invalid input(s): POCBNP  CBG: Recent Labs  Lab 11/19/19 1540 11/19/19 2000 11/20/19 0004 11/20/19 0354 11/20/19 0727  GLUCAP 41 111* 108* 119* 109*    Microbiology: Results for orders placed or performed during the hospital encounter of 11/17/19  Wound or Superficial Culture     Status: Abnormal   Collection Time: 11/17/19  2:03 PM   Specimen: Wound  Result Value Ref Range Status   Specimen Description   Final    WOUND Performed at [facility], [address]  [place], [place] [address]    Special Requests   Final    RL Performed at [facility], [address] [place], [place] [address]    Gram Stain   Final    NO WBC SEEN ABUNDANT GRAM NEGATIVE RODS FEW GRAM POSITIVE COCCI Performed at [facility], [place] [address] [place],  [address]    Culture MULTIPLE ORGANISMS PRESENT, NONE PREDOMINANT (A)  Final   Report Status 11/20/2019 FINAL  Final  Respiratory Panel by RT PCR (Flu A&B, Covid) - Nasopharyngeal Swab     Status: None   Collection Time: 11/17/19  2:05 PM   Specimen: Nasopharyngeal Swab  Result Value Ref Range Status   SARS Coronavirus 2 by RT PCR NEGATIVE NEGATIVE Final    Comment: (NOTE) SARS-CoV-2 target nucleic acids are NOT DETECTED.  The SARS-CoV-2 RNA is generally detectable in upper respiratoy specimens during the acute phase of infection. The lowest concentration of SARS-CoV-2 viral copies this assay can detect is 131 copies/mL. A negative result does not preclude SARS-Cov-2 infection and should not be used as the sole basis for treatment or other patient management decisions. A negative result may occur with  improper specimen collection/handling, submission of specimen other than nasopharyngeal swab, presence of viral mutation(s) within the areas targeted by this assay, and inadequate number of viral copies (<131 copies/mL). A negative result must be combined with clinical observations, patient history, and epidemiological information. The expected result is Negative.  Fact Sheet for Patients:  PinkCheek.be  Fact Sheet for Healthcare Providers:  GravelBags.it  This test is no t yet approved or cleared by the [place] FDA  and  has been authorized for detection and/or diagnosis of SARS-CoV-2 by FDA under an Emergency Use Authorization (EUA). This EUA will remain  in effect (meaning this test can be used) for the duration of  the COVID-19 declaration under Section 564(b)(1) of the Act, 21 U.S.C. section 360bbb-3(b)(1), unless the authorization is terminated or revoked sooner.     Influenza A by PCR NEGATIVE NEGATIVE Final   Influenza B by PCR NEGATIVE NEGATIVE Final    Comment: (NOTE) The Xpert Xpress SARS-CoV-2/FLU/RSV assay is intended as an aid in  the diagnosis of influenza from Nasopharyngeal swab specimens and  should not be used as a sole basis for treatment. Nasal washings and  aspirates are unacceptable for Xpert Xpress SARS-CoV-2/FLU/RSV  testing.  Fact Sheet for Patients: PinkCheek.be  Fact Sheet for Healthcare Providers: GravelBags.it  This test is not yet approved or cleared by the [place] FDA and  has been authorized for detection and/or diagnosis of SARS-CoV-2 by  FDA under an Emergency Use Authorization (EUA). This EUA will remain  in effect (meaning this test can be used) for the duration of the  Covid-19 declaration under Section 564(b)(1) of the Act, 21  U.S.C. section 360bbb-3(b)(1), unless the authorization is  terminated or revoked. Performed at [facility], [address] [place], [place] [address]   Blood Culture (routine x 2)     Status: None   Collection Time: 11/17/19  2:06 PM   Specimen: BLOOD  Result Value Ref Range Status   Specimen Description BLOOD BLOOD RIGHT HAND  Final   Special Requests   Final    BOTTLES DRAWN AEROBIC AND ANAEROBIC Blood Culture results may not be optimal due to an inadequate volume of blood received in culture bottles   Culture   Final    NO GROWTH 5 DAYS Performed at [facility], [address] [place], [place] [address]    Report Status 11/22/2019 FINAL  Final  Blood Culture (routine x 2)     Status: None   Collection Time: 11/17/19  2:06 PM   Specimen: BLOOD  Result Value Ref Range Status   Specimen Description BLOOD BLOOD RIGHT HAND  Final    Special Requests   Final    BOTTLES DRAWN AEROBIC AND ANAEROBIC Blood Culture adequate volume   Culture   Final    NO GROWTH 5 DAYS Performed at [facility], [address] [place], [place] [address]    Report Status 11/22/2019 FINAL  Final  MRSA PCR Screening     Status: Abnormal   Collection Time: 11/18/19  1:03 AM   Specimen: Nasopharyngeal  Result Value Ref Range Status   MRSA by PCR POSITIVE (A) NEGATIVE Corrected    Comment:        The GeneXpert MRSA Assay (FDA approved for NASAL specimens only), is one component of a comprehensive MRSA colonization surveillance program. It is not intended to diagnose MRSA infection nor to guide or monitor treatment for MRSA infections. RESULT CALLED TO, READ BACK BY AND VERIFIED WITH: CYNTHIA VASQUEZ @0245  ON 11/18/19 SKL Performed at [facility], [address] [place],  [address] CORRECTED ON 10/29 AT 0249: PREVIOUSLY REPORTED AS POSITIVE        The GeneXpert MRSA Assay (FDA approved for NASAL specimens only), is one component of a comprehensive MRSA colonization surveillance program. It is not intended to diagnose MRSA infection  nor to guide or monitor treatment for MRSA infections. CYNTHIA VASQUEZ @0245  ON 11/18/19  SKL     Coagulation Studies: No results for input(s): LABPROT, INR in the last 72 hours.  Urinalysis: No results for input(s): COLORURINE, LABSPEC, PHURINE, GLUCOSEU, HGBUR, BILIRUBINUR, KETONESUR, PROTEINUR, UROBILINOGEN, NITRITE, LEUKOCYTESUR in the last 72 hours.  Invalid input(s): APPERANCEUR    Imaging: CT HEAD WO CONTRAST  Result Date: 11/20/2019 CLINICAL DATA:  Neuro deficit, acute. EXAM: CT HEAD WITHOUT CONTRAST TECHNIQUE: Contiguous axial images were obtained from the base of the skull through the vertex without intravenous contrast. COMPARISON:  None. FINDINGS: Brain: No acute infarct or intracranial hemorrhage. No mass lesion. No midline shift, ventriculomegaly or extra-axial  fluid collection. Vascular: No hyperdense vessel or unexpected calcification. Bilateral skull base atherosclerotic calcifications. Skull: Negative for fracture or focal lesion. Sinuses/Orbits: Normal orbits. Clear paranasal sinuses. No mastoid effusion. Other: None. IMPRESSION: No acute intracranial process. Electronically Signed   By: Primitivo Gauze M.D.   On: 11/20/2019 20:50     Medications:   .  prismasol BGK 4/2.5 500 mL/hr at 11/22/19 0756  .  prismasol BGK 4/2.5 500 mL/hr at 11/22/19 0756  . sodium chloride    . sodium chloride 10 mL/hr at 11/22/19 1100  .  ceFAZolin (ANCEF) IV    . dextrose 30 mL/hr at 11/22/19 1100  . prismasol BGK 4/2.5 2,000 mL/hr at 11/22/19 1026   . aspirin EC  81 mg Oral Daily  . Chlorhexidine Gluconate Cloth  6 each Topical Daily  . collagenase   Topical Daily  . feeding supplement  237 mL Oral TID BM  . ferrous sulfate  325 mg Oral BID  . heparin  5,000 Units Subcutaneous Q8H  . hydrocortisone sod succinate (SOLU-CORTEF) inj  50 mg Intravenous Q6H  . mouth rinse  15 mL Mouth Rinse BID  . midodrine  20 mg Oral TID  . mupirocin ointment  1 application Nasal BID  . pantoprazole (PROTONIX) IV  40 mg Intravenous Q12H  . phosphorus  500 mg Oral Q4H  . sodium chloride flush  3 mL Intravenous Q12H   sodium chloride, acetaminophen **OR** acetaminophen, atropine, dextrose, heparin, heparin sodium (porcine), melatonin, morphine injection, morphine injection, sodium chloride, sodium chloride flush  Assessment/ Plan:  [name] is a 49 y.o. black male with end stage renal disease on hemodialysis, history of kidney transplant, lupus nephritis, and hypotension who was admitted to [facility] on 11/17/2019 for Cellulitis of right lower extremity [W10.272] ESRD on hemodialysis ([place]) [N18.6, Z99.2] Hypotension [I95.9] Severe sepsis ([place]) [A41.9, R65.20]  [facility] Left AVF 93kg   1. End stage renal disease: on hemodialysis. Last intermittent  treatment was 10/28.  Hemodynamically unstable. CRRT from 10/30-10/31, Then 11/1-11/2 -Continuous renal replacement therapy: CVVHDF 2K bath. Continue ultrafiltration - Plan on transitioning to intermittent hemodialysis tomorrow if hemodynamically stable.   2. Sepsis/hypotension with right lower extremity cellulitis and leukocytosis. Blood cultures with no growth. Leukocytosis improving.  - stress dose hydrocortisone - broad spectrum antibiotics: cefazolin - Continue midodrine.   3. Anemia with chronic kidney disease: with thrombocytopenia. Hemoglobin 8.3 EPO 20000 units given on 10/31 subcutaneous    LOS: 5 Charne Mcbrien 11/2/202111:38 AM

## 2019-11-22 NOTE — Progress Notes (Signed)
Progress Note  Patient Name: [name] Date of Encounter: 11/22/2019  [facility] Cardiologist: New  Subjective   Patient has multiple complaints including pain in the legs, feeling weak and tired, and tightness all over his body including in his chest and abdomen.  Denies shortness of breath.  Inpatient Medications    Scheduled Meds: . aspirin EC  81 mg Oral Daily  . Chlorhexidine Gluconate Cloth  6 each Topical Daily  . collagenase   Topical Daily  . feeding supplement  237 mL Oral TID BM  . ferrous sulfate  325 mg Oral BID  . heparin  5,000 Units Subcutaneous Q8H  . hydrocortisone sod succinate (SOLU-CORTEF) inj  50 mg Intravenous Q6H  . mouth rinse  15 mL Mouth Rinse BID  . midodrine  20 mg Oral TID  . mupirocin ointment  1 application Nasal BID  . pantoprazole (PROTONIX) IV  40 mg Intravenous Q12H  . sodium chloride flush  3 mL Intravenous Q12H   Continuous Infusions: .  prismasol BGK 4/2.5 Stopped (11/20/19 1800)  .  prismasol BGK 4/2.5 Stopped (11/20/19 1800)  . sodium chloride    . sodium chloride 10 mL/hr at 11/22/19 0700  . ceFEPime (MAXIPIME) IV Stopped (11/21/19 2221)  . dextrose 30 mL/hr at 11/22/19 0700  . norepinephrine (LEVOPHED) Adult infusion Stopped (11/22/19 0505)  . phenylephrine (NEO-SYNEPHRINE) Adult infusion Stopped (11/19/19 1433)  . prismasol BGK 4/2.5 2,000 mL/hr at 11/21/19 1654  . vasopressin Stopped (11/20/19 1113)   PRN Meds: sodium chloride, acetaminophen **OR** acetaminophen, atropine, dextrose, heparin, heparin sodium (porcine), melatonin, morphine injection, morphine injection, sodium chloride, sodium chloride flush   Vital Signs    Vitals:   11/22/19 0300 11/22/19 0500 11/22/19 0720 11/22/19 0724  BP: (!) 105/45 (!) 131/59  140/61  Pulse: (!) 47 (!) 48 (!) 39 (!) 36  Resp: 18 (!) 26 19 19   Temp:    97.9 F (36.6 C)  TempSrc:    Oral  SpO2: 100% 100% 100% 96%  Weight:      Height:        Intake/Output Summary  (Last 24 hours) at 11/22/2019 0736 Last data filed at 11/22/2019 0700 Gross per 24 hour  Intake 1506.32 ml  Output 2417 ml  Net -910.68 ml   Last 3 Weights 11/22/2019 11/21/2019 11/20/2019  Weight (lbs) 218 lb 7.6 oz 213 lb 10 oz 213 lb 10 oz  Weight (kg) 99.1 kg 96.9 kg 96.9 kg      Telemetry    Episodes of atrial fibrillation, though primarily sinus bradycardia and possible junctional rhythm.  PACs and PVCs noted. - Personally Reviewed  ECG    No new tracing.  Physical Exam   GEN: No acute distress.   Neck:  Unable to assess JVP due to positioning. Cardiac:  Bradycardic and irregular.  No murmurs. Respiratory: Clear anteriorly.  Patient unable to sit up or roll to allow for posterior auscultation. GI:  Mildly distended and firm.  Nontender. MS:  Trace left and 1+ right lower extremity edema.  Right calf is covered with a clean dressing. Neuro:  Nonfocal  Psych: Confused but able to answer simple questions with redirection  Labs    High Sensitivity Troponin:   Recent Labs  Lab 11/18/19 2200 11/19/19 0600  TROPONINIHS 96* 81*      Chemistry Recent Labs  Lab 11/17/19 1405 11/17/19 1405 11/18/19 0500 11/18/19 2030 11/21/19 0330 11/21/19 1709 11/22/19 0522  NA 139   < > 136   < >  133*  132* 132* 133*  133*  K 4.1   < > 4.5   < > 3.8  3.7 3.6 3.6  3.6  CL 97*   < > 100   < > 98  97* 99 99  98  CO2 27   < > 25   < > 25  25 23 23  24   GLUCOSE 61*   < > 61*   < > 112*  112* 128* 118*  119*  BUN 18   < > 25*   < > 28*  28* 30* 24*  24*  CREATININE 4.17*   < > 4.80*   < > 2.97*  3.05* 2.74* 2.17*  2.26*  CALCIUM 9.5   < > 8.9   < > 10.4*  10.3 9.9 9.9  9.9  PROT 7.2  --  6.1*  --   --   --   --   ALBUMIN 3.4*   < > 2.8*   < > 2.8* 2.6* 2.8*  AST 26  --  17  --   --   --   --   ALT 8  --  10  --   --   --   --   ALKPHOS 129*  --  117  --   --   --   --   BILITOT 1.6*  --  2.1*  --   --   --   --   GFRNONAA 17*   < > 14*   < > 25*  24* 27* 36*  34*   ANIONGAP 15   < > 11   < > 10  10 10 11  11    < > = values in this interval not displayed.     Hematology Recent Labs  Lab 11/20/19 0509 11/21/19 0330 11/22/19 0522  WBC 15.5* 17.0* 12.8*  RBC 2.89* 2.88* 2.85*  HGB 8.4* 8.4* 8.3*  HCT 25.3* 25.5* 24.9*  MCV 87.5 88.5 87.4  MCH 29.1 29.2 29.1  MCHC 33.2 32.9 33.3  RDW 16.2* 16.0* 16.1*  PLT 114* 131* 104*    BNPNo results for input(s): BNP, PROBNP in the last 168 hours.   DDimer No results for input(s): DDIMER in the last 168 hours.   Radiology    CT HEAD WO CONTRAST  Result Date: 11/20/2019 CLINICAL DATA:  Neuro deficit, acute. EXAM: CT HEAD WITHOUT CONTRAST TECHNIQUE: Contiguous axial images were obtained from the base of the skull through the vertex without intravenous contrast. COMPARISON:  None. FINDINGS: Brain: No acute infarct or intracranial hemorrhage. No mass lesion. No midline shift, ventriculomegaly or extra-axial fluid collection. Vascular: No hyperdense vessel or unexpected calcification. Bilateral skull base atherosclerotic calcifications. Skull: Negative for fracture or focal lesion. Sinuses/Orbits: Normal orbits. Clear paranasal sinuses. No mastoid effusion. Other: None. IMPRESSION: No acute intracranial process. Electronically Signed   By: Primitivo Gauze M.D.   On: 11/20/2019 20:50    Cardiac Studies   TTE (11/19/19): 1. Left ventricular ejection fraction, by estimation, is 65 to 70%. Left  ventricular ejection fraction by PLAX is 64 %. The left ventricle has  normal function. The left ventricle has no regional wall motion  abnormalities. Left ventricular diastolic  function could not be evaluated. There is the interventricular septum is  flattened in systole and diastole, consistent with right ventricular  pressure and volume overload.  2. Right ventricular systolic function is normal. The right ventricular  size is normal.  3. The mitral valve is grossly  normal. No evidence of mitral valve   regurgitation.  4. The aortic valve is normal in structure. Aortic valve regurgitation is  not visualized.   Patient Profile     49 y.o. male with history of ESRD on hemodialysis, prior renal transplant on chronic prednisone therapy, lupus, and chronic anemia, whom we have been asked to see due to wide-complex tachycardia.  Assessment & Plan    Wide-complex tachycardia: No recurrence over the last 24 hours.  Patient previously on amiodarone, though this has been discontinued due to bradycardia.  Patient seen by Dr. Quentin Ore (EP) over the weekend with assessment that WCT most likely reflects outflow tract ventricular tachycardia driven by sepsis.  Continue to treat septic shock.  Defer reinitiation of amiodarone as well as beta-blockers in the setting of bradycardia and absence of recurrent VT.  Maintain electrolytes for target potassium and magnesium levels greater than 4.0 and 2.0, respectively.  Paroxysmal atrial fibrillation: Ventricular rates reasonably well controlled, favoring bradycardia when in atrial fibrillation.  CHA2DS2-VASc score is 0.  Defer anticoagulation in the setting of low CHA2DS2-VASc score and chronic anemia/thrombocytopenia.  Chest pain: Patient is confused and is complaining of tightness all over, including in his chest.  Trend troponin.  Repeat EKG.  Favor deferring ischemia work-up unless there is evidence of ongoing ischemia, given that the patient remains in septic shock requiring vasopressor support.   Septic shock: Patient remains on norepinephrine.  Continued management per CCM.  For questions or updates, please contact [place] Please consult [email or web address] for contact info under [facility].     Signed, Nelva Bush, MD  11/22/2019, 7:36 AM

## 2019-11-23 DIAGNOSIS — I480 Paroxysmal atrial fibrillation: Secondary | ICD-10-CM

## 2019-11-23 DIAGNOSIS — L03115 Cellulitis of right lower limb: Secondary | ICD-10-CM | POA: Diagnosis not present

## 2019-11-23 DIAGNOSIS — N186 End stage renal disease: Secondary | ICD-10-CM | POA: Diagnosis not present

## 2019-11-23 LAB — MAGNESIUM: Magnesium: 2.5 mg/dL — ABNORMAL HIGH (ref 1.7–2.4)

## 2019-11-23 LAB — CBC WITH DIFFERENTIAL/PLATELET
Abs Immature Granulocytes: 0.57 10*3/uL — ABNORMAL HIGH (ref 0.00–0.07)
Basophils Absolute: 0 10*3/uL (ref 0.0–0.1)
Basophils Relative: 0 %
Eosinophils Absolute: 0 10*3/uL (ref 0.0–0.5)
Eosinophils Relative: 0 %
HCT: 25 % — ABNORMAL LOW (ref 39.0–52.0)
Hemoglobin: 8 g/dL — ABNORMAL LOW (ref 13.0–17.0)
Immature Granulocytes: 4 %
Lymphocytes Relative: 8 %
Lymphs Abs: 1.1 10*3/uL (ref 0.7–4.0)
MCH: 29.4 pg (ref 26.0–34.0)
MCHC: 32 g/dL (ref 30.0–36.0)
MCV: 91.9 fL (ref 80.0–100.0)
Monocytes Absolute: 0.9 10*3/uL (ref 0.1–1.0)
Monocytes Relative: 7 %
Neutro Abs: 11.1 10*3/uL — ABNORMAL HIGH (ref 1.7–7.7)
Neutrophils Relative %: 81 %
Platelets: 116 10*3/uL — ABNORMAL LOW (ref 150–400)
RBC: 2.72 MIL/uL — ABNORMAL LOW (ref 4.22–5.81)
RDW: 16.3 % — ABNORMAL HIGH (ref 11.5–15.5)
Smear Review: NORMAL
WBC: 13.8 10*3/uL — ABNORMAL HIGH (ref 4.0–10.5)
nRBC: 0.7 % — ABNORMAL HIGH (ref 0.0–0.2)

## 2019-11-23 LAB — RENAL FUNCTION PANEL
Albumin: 2.6 g/dL — ABNORMAL LOW (ref 3.5–5.0)
Anion gap: 13 (ref 5–15)
BUN: 26 mg/dL — ABNORMAL HIGH (ref 6–20)
CO2: 23 mmol/L (ref 22–32)
Calcium: 10.1 mg/dL (ref 8.9–10.3)
Chloride: 98 mmol/L (ref 98–111)
Creatinine, Ser: 2.24 mg/dL — ABNORMAL HIGH (ref 0.61–1.24)
GFR, Estimated: 35 mL/min — ABNORMAL LOW (ref >60)
Glucose, Bld: 109 mg/dL — ABNORMAL HIGH (ref 70–99)
Phosphorus: 2.4 mg/dL — ABNORMAL LOW (ref 2.5–4.6)
Potassium: 3.9 mmol/L (ref 3.5–5.1)
Sodium: 134 mmol/L — ABNORMAL LOW (ref 135–145)

## 2019-11-23 LAB — BASIC METABOLIC PANEL
Anion gap: 9 (ref 5–15)
BUN: 26 mg/dL — ABNORMAL HIGH (ref 6–20)
CO2: 25 mmol/L (ref 22–32)
Calcium: 9.9 mg/dL (ref 8.9–10.3)
Chloride: 99 mmol/L (ref 98–111)
Creatinine, Ser: 2.35 mg/dL — ABNORMAL HIGH (ref 0.61–1.24)
GFR, Estimated: 33 mL/min — ABNORMAL LOW (ref >60)
Glucose, Bld: 107 mg/dL — ABNORMAL HIGH (ref 70–99)
Potassium: 3.9 mmol/L (ref 3.5–5.1)
Sodium: 133 mmol/L — ABNORMAL LOW (ref 135–145)

## 2019-11-23 LAB — PHOSPHORUS: Phosphorus: 2.4 mg/dL — ABNORMAL LOW (ref 2.5–4.6)

## 2019-11-23 MED ORDER — EPOETIN ALFA 10000 UNIT/ML IJ SOLN
10000.0000 [IU] | INTRAMUSCULAR | Status: DC
  Administered 2019-11-23 – 2019-12-03 (×5): 10000 [IU] via INTRAVENOUS
  Filled 2019-11-23: qty 1

## 2019-11-23 MED ORDER — HYDROMORPHONE HCL 1 MG/ML IJ SOLN
1.0000 mg | INTRAMUSCULAR | Status: DC | PRN
  Administered 2019-11-23 – 2019-11-27 (×16): 1 mg via INTRAVENOUS
  Filled 2019-11-23 (×16): qty 1

## 2019-11-23 MED ORDER — TRAZODONE HCL 50 MG PO TABS
50.0000 mg | ORAL_TABLET | Freq: Every evening | ORAL | Status: DC | PRN
  Administered 2019-11-23: 50 mg via ORAL
  Filled 2019-11-23: qty 1

## 2019-11-23 MED ORDER — CEFAZOLIN SODIUM-DEXTROSE 1-4 GM/50ML-% IV SOLN
1.0000 g | INTRAVENOUS | Status: DC
  Administered 2019-11-23 – 2019-11-24 (×2): 1 g via INTRAVENOUS
  Filled 2019-11-23 (×4): qty 50

## 2019-11-23 NOTE — Progress Notes (Signed)
PROGRESS NOTE    [name]   SPQ:[id number]  DOB: 1970-10-28  PCP: Pcp, No    DOA: 11/17/2019 LOS: 6   Brief Narrative   [name] is a 49 y.o. male with medical history significant for end-stage renal disease on hemodialysis on Tuesday, Thursday, Saturday schedule, chronic prednisone therapy in the setting of previous kidney transplant, chronic anemia with baseline hemoglobin 8-9, diverticulitis, GERD, who is admitted on 11/17/2019 with right lower extremity cellulitis after presenting to the ED from home with worsening right lower extremity pain and swelling with purulent discharge, and associated fever/chills, rigors and general malaise.    While holding for a bed in the ED, patient became hypotensive requiring pressors, and ICU level of care for septic shock secondary to right lower extremity cellulitis.       TRH assumed care of patient on 11/23/19.  BP stable off pressors.  Had been receiving CRRT in ICU with plan for return to hemodialysis 11/3.  BP stable after dialysis, patient transferred out of ICU to progressive unit.     Assessment & Plan   Principal Problem:   Cellulitis of right lower extremity Active Problems:   Severe sepsis (HCC)   Lactic acidosis   Hypoglycemia   Anemia   GERD (gastroesophageal reflux disease)   ESRD (end stage renal disease) (HCC)   Paroxysmal atrial fibrillation (HCC)   Septic shock secondary to severe right lower extremity cellulitis - weaned off pressors, transferred out of ICU on 11/3 after hemodialysis and BP stable. Clinically improving, afebrile, leukocytosis improved. --wound culture grew multiple organisms --On Ancef, continue --Tylenol PRN fevers --Analgesics PRN --appears is on stress dose steroids  Hypotension - on midodrine per nephro  Acute metabolic encephalopathy - likely due to infection and renal failure, resolved.  ESRD - on hemodialysis.  Nephrology following.  Wide-complex tachycardia - Cardiology  following, suspected due to sepsis.   Has not recurred in now >48 hours.  Was on amiodarone which was d/c'd due to bradycardia.  Seen by EP, agreed likely due to sepsis.   Patient has normal LV Systolic function --Telemetry monitoring --Maintain K>4.0 and Mg > 2.0  Paroxysmal A-fib - CHA2DS2-VASc score 0, no anticoagulation indicated and would be higher risk in setting of anemia and thrombocytopenia  Anemia of chronic renal disease - on EPO.  Monitor CBC.  Hypoglycemic episode - resolved.  Required D10 infusion.   Patient BMI: Body mass index is 27.95 kg/m.   DVT prophylaxis: heparin injection 5,000 Units Start: 11/17/19 2200   Diet:  Diet Orders (From admission, onward)    Start     Ordered   11/17/19 1743  Diet renal/carb modified with fluid restriction Fluid restriction: 2000 mL Fluid; Room service appropriate? Yes; Fluid consistency: Thin  Diet effective now       Question Answer Comment  Fluid restriction: 2000 mL Fluid   Room service appropriate? Yes   Fluid consistency: Thin      11/17/19 1742            Code Status: Full Code    Subjective 11/23/19    Pt seen in ICU before dialysis today.  Asks to work with PT and asks if he will "be able to walk out of here".  Has not been up out of bed yet.  No other acute complaints at this time.   Disposition Plan & Communication   Status is: Inpatient  Inpatient status remains appropriate because of severity of illness, patient just weaned  off pressors and transferred out of ICU today.  Dispo: The patient is from: Home              Anticipated d/c is to: home vs SNF pending PT eval              Anticipated d/c date is: 2 days              Patient currently is not medically stable for d/c.      Family Communication: none at bedside, will attempt to call    Consults, Procedures, Significant Events   Consultants:   Nephrology  Cardiology  PCCM  Procedures:   Dialysis, CRRT  Antimicrobials:   Anti-infectives (From admission, onward)   Start     Dose/Rate Route Frequency Ordered Stop   11/23/19 1800  ceFAZolin (ANCEF) IVPB 1 g/50 mL premix        1 g 100 mL/hr over 30 Minutes Intravenous Every 24 hours 11/23/19 1222 11/27/19 1759   11/22/19 2200  ceFAZolin (ANCEF) IVPB 2g/100 mL premix  Status:  Discontinued        2 g 200 mL/hr over 30 Minutes Intravenous Every 12 hours 11/22/19 1136 11/23/19 1222   11/19/19 2000  vancomycin (VANCOCIN) IVPB 1000 mg/200 mL premix  Status:  Discontinued        1,000 mg 200 mL/hr over 60 Minutes Intravenous Every 24 hours 11/19/19 1422 11/21/19 1107   11/19/19 1800  ceFEPIme (MAXIPIME) 2 g in sodium chloride 0.9 % 100 mL IVPB  Status:  Discontinued        2 g 200 mL/hr over 30 Minutes Intravenous Every 12 hours 11/19/19 1422 11/22/19 1135   11/19/19 1200  vancomycin (VANCOCIN) IVPB 1000 mg/200 mL premix  Status:  Discontinued        1,000 mg 200 mL/hr over 60 Minutes Intravenous Every T-Th-Sa (Hemodialysis) 11/17/19 1836 11/18/19 1444   11/18/19 2000  ceFEPIme (MAXIPIME) 2 g in sodium chloride 0.9 % 100 mL IVPB  Status:  Discontinued        2 g 200 mL/hr over 30 Minutes Intravenous Every 24 hours 11/18/19 0154 11/18/19 0858   11/18/19 2000  ceFEPIme (MAXIPIME) 1 g in sodium chloride 0.9 % 100 mL IVPB  Status:  Discontinued        1 g 200 mL/hr over 30 Minutes Intravenous Every 24 hours 11/18/19 0858 11/19/19 1422   11/18/19 1500  cefTRIAXone (ROCEPHIN) 1 g in sodium chloride 0.9 % 100 mL IVPB  Status:  Discontinued        1 g 200 mL/hr over 30 Minutes Intravenous Every 24 hours 11/17/19 1800 11/17/19 2207   11/18/19 1442  vancomycin variable dose per unstable renal function (pharmacist dosing)  Status:  Discontinued         Does not apply See admin instructions 11/18/19 1444 11/19/19 1422   11/17/19 2245  ceFEPIme (MAXIPIME) 2 g in sodium chloride 0.9 % 100 mL IVPB        2 g 200 mL/hr over 30 Minutes Intravenous  Once 11/17/19 2214 11/18/19  0156   11/17/19 2200  clindamycin (CLEOCIN) IVPB 600 mg  Status:  Discontinued        600 mg 100 mL/hr over 30 Minutes Intravenous Every 8 hours 11/17/19 2011 11/18/19 2013   11/17/19 2000  vancomycin (VANCOREADY) IVPB 750 mg/150 mL  Status:  Discontinued        750 mg 150 mL/hr over 60 Minutes Intravenous Every 24 hours 11/17/19 1824  11/17/19 1832   11/17/19 1545  piperacillin-tazobactam (ZOSYN) IVPB 3.375 g  Status:  Discontinued        3.375 g 100 mL/hr over 30 Minutes Intravenous  Once 11/17/19 1535 11/17/19 1544   11/17/19 1545  clindamycin (CLEOCIN) IVPB 900 mg  Status:  Discontinued        900 mg 100 mL/hr over 30 Minutes Intravenous  Once 11/17/19 1535 11/17/19 1544   11/17/19 1500  vancomycin (VANCOREADY) IVPB 1250 mg/250 mL  Status:  Discontinued        1,250 mg 166.7 mL/hr over 90 Minutes Intravenous  Once 11/17/19 1410 11/17/19 1411   11/17/19 1415  vancomycin (VANCOCIN) IVPB 1000 mg/200 mL premix        1,000 mg 200 mL/hr over 60 Minutes Intravenous  Once 11/17/19 1401 11/17/19 1825   11/17/19 1415  cefTRIAXone (ROCEPHIN) 2 g in sodium chloride 0.9 % 100 mL IVPB        2 g 200 mL/hr over 30 Minutes Intravenous  Once 11/17/19 1401 11/17/19 1558         Objective   Vitals:   11/23/19 1500 11/23/19 1530 11/23/19 1844 11/23/19 1845  BP: (!) 109/53 (!) 106/51  (!) 126/50  Pulse: (!) 53 (!) 50  72  Resp: 11 16  20   Temp:    98.6 F (37 C)  TempSrc:      SpO2: 91% 98%  99%  Weight:   98.7 kg   Height:   6\' 2"  (1.88 m)     Intake/Output Summary (Last 24 hours) at 11/23/2019 2051 Last data filed at 11/23/2019 1530 Gross per 24 hour  Intake 526.13 ml  Output 1000 ml  Net -473.87 ml   Filed Weights   11/22/19 0246 11/23/19 0500 11/23/19 1844  Weight: 99.1 kg 100 kg 98.7 kg    Physical Exam:  General exam: awake, alert, no acute distress Respiratory system: CTAB, normal respiratory effort. Cardiovascular system: normal S1/S2, RRR, trace LE edema.    Gastrointestinal system: soft, NT, ND, +bowel sounds. Central nervous system: A&O x3. no gross focal neurologic deficits, normal speech Extremities: right lower extremity dressing in place, trace edema, moves all Psychiatry: normal mood, congruent affect  Labs   Data Reviewed: I have personally reviewed following labs and imaging studies  CBC: Recent Labs  Lab 11/19/19 0600 11/20/19 0509 11/21/19 0330 11/22/19 0522 11/23/19 0416  WBC 20.2* 15.5* 17.0* 12.8* 13.8*  NEUTROABS 18.6* 14.1* 15.2* 10.8* 11.1*  HGB 8.4* 8.4* 8.4* 8.3* 8.0*  HCT 26.1* 25.3* 25.5* 24.9* 25.0*  MCV 91.6 87.5 88.5 87.4 91.9  PLT 102* 114* 131* 104* 967*   Basic Metabolic Panel: Recent Labs  Lab 11/19/19 0600 11/19/19 1645 11/20/19 0509 11/20/19 1610 11/21/19 0330 11/21/19 1709 11/22/19 0522 11/22/19 1628 11/23/19 0416  NA 131*   < > 133*  134*   < > 133*  132* 132* 133*  133* 135 133*  134*  K 5.0   < > 4.0  4.0   < > 3.8  3.7 3.6 3.6  3.6 3.9 3.9  3.9  CL 96*   < > 98  98   < > 98  97* 99 99  98 99 99  98  CO2 25   < > 24  23   < > 25  25 23 23  24 24 25  23   GLUCOSE 122*   < > 112*  108*   < > 112*  112* 128* 118*  119*  125* 107*  109*  BUN 34*   < > 25*  24*   < > 28*  28* 30* 24*  24* 21* 26*  26*  CREATININE 5.68*   < > 3.28*  3.30*   < > 2.97*  3.05* 2.74* 2.17*  2.26* 1.81* 2.35*  2.24*  CALCIUM 9.0   < > 9.9  9.9   < > 10.4*  10.3 9.9 9.9  9.9 9.6 9.9  10.1  MG 2.4  --  2.1  --  2.2  --  2.6*  --  2.5*  PHOS 4.6   < > 3.4  3.6   < > 3.1  3.2 2.3* 1.7*  1.7* 1.6* 2.4*  2.4*   < > = values in this interval not displayed.   GFR: Estimated Creatinine Clearance: 47.2 mL/min (A) (by C-G formula based on SCr of 2.35 mg/dL (H)). Liver Function Tests: Recent Labs  Lab 11/17/19 1405 11/17/19 1405 11/18/19 0500 11/19/19 1645 11/21/19 0330 11/21/19 1709 11/22/19 0522 11/22/19 1628 11/23/19 0416  AST 26  --  17  --   --   --   --   --   --   ALT 8   --  10  --   --   --   --   --   --   ALKPHOS 129*  --  117  --   --   --   --   --   --   BILITOT 1.6*  --  2.1*  --   --   --   --   --   --   PROT 7.2  --  6.1*  --   --   --   --   --   --   ALBUMIN 3.4*   < > 2.8*   < > 2.8* 2.6* 2.8* 2.6* 2.6*   < > = values in this interval not displayed.   No results for input(s): LIPASE, AMYLASE in the last 168 hours. No results for input(s): AMMONIA in the last 168 hours. Coagulation Profile: Recent Labs  Lab 11/17/19 1405 11/18/19 0500  INR 1.2 1.8*   Cardiac Enzymes: Recent Labs  Lab 11/17/19 1405  CKTOTAL 136   BNP (last 3 results) No results for input(s): PROBNP in the last 8760 hours. HbA1C: No results for input(s): HGBA1C in the last 72 hours. CBG: Recent Labs  Lab 11/19/19 1540 11/19/19 2000 11/20/19 0004 11/20/19 0354 11/20/19 0727  GLUCAP 84 111* 108* 119* 109*   Lipid Profile: No results for input(s): CHOL, HDL, LDLCALC, TRIG, CHOLHDL, LDLDIRECT in the last 72 hours. Thyroid Function Tests: No results for input(s): TSH, T4TOTAL, FREET4, T3FREE, THYROIDAB in the last 72 hours. Anemia Panel: No results for input(s): VITAMINB12, FOLATE, FERRITIN, TIBC, IRON, RETICCTPCT in the last 72 hours. Sepsis Labs: Recent Labs  Lab 11/17/19 1405 11/17/19 2000 11/18/19 0103 11/18/19 0500 11/18/19 2030 11/19/19 0600  PROCALCITON  --   --  30.72 39.28  --  50.67  LATICACIDVEN 3.2*  3.3* 3.6* 2.6*  --  1.6  --     Recent Results (from the past 240 hour(s))  Wound or Superficial Culture     Status: Abnormal   Collection Time: 11/17/19  2:03 PM   Specimen: Wound  Result Value Ref Range Status   Specimen Description   Final    WOUND Performed at [facility], [address], [place], [place] [address]    Special Requests   Final  RL Performed at [facility], [address] [place], [place] [address]    Gram Stain   Final    NO WBC SEEN ABUNDANT GRAM NEGATIVE RODS FEW GRAM POSITIVE  COCCI Performed at [facility], [place] [address] [place], [place] [address]    Culture MULTIPLE ORGANISMS PRESENT, NONE PREDOMINANT (A)  Final   Report Status 11/20/2019 FINAL  Final  Respiratory Panel by RT PCR (Flu A&B, Covid) - Nasopharyngeal Swab     Status: None   Collection Time: 11/17/19  2:05 PM   Specimen: Nasopharyngeal Swab  Result Value Ref Range Status   SARS Coronavirus 2 by RT PCR NEGATIVE NEGATIVE Final    Comment: (NOTE) SARS-CoV-2 target nucleic acids are NOT DETECTED.  The SARS-CoV-2 RNA is generally detectable in upper respiratoy specimens during the acute phase of infection. The lowest concentration of SARS-CoV-2 viral copies this assay can detect is 131 copies/mL. A negative result does not preclude SARS-Cov-2 infection and should not be used as the sole basis for treatment or other patient management decisions. A negative result may occur with  improper specimen collection/handling, submission of specimen other than nasopharyngeal swab, presence of viral mutation(s) within the areas targeted by this assay, and inadequate number of viral copies (<131 copies/mL). A negative result must be combined with clinical observations, patient history, and epidemiological information. The expected result is Negative.  Fact Sheet for Patients:  PinkCheek.be  Fact Sheet for Healthcare Providers:  GravelBags.it  This test is no t yet approved or cleared by the [place] FDA and  has been authorized for detection and/or diagnosis of SARS-CoV-2 by FDA under an Emergency Use Authorization (EUA). This EUA will remain  in effect (meaning this test can be used) for the duration of the COVID-19 declaration under Section 564(b)(1) of the Act, 21 U.S.C. section 360bbb-3(b)(1), unless the authorization is terminated or revoked sooner.     Influenza A by PCR NEGATIVE NEGATIVE Final   Influenza B by PCR NEGATIVE  NEGATIVE Final    Comment: (NOTE) The Xpert Xpress SARS-CoV-2/FLU/RSV assay is intended as an aid in  the diagnosis of influenza from Nasopharyngeal swab specimens and  should not be used as a sole basis for treatment. Nasal washings and  aspirates are unacceptable for Xpert Xpress SARS-CoV-2/FLU/RSV  testing.  Fact Sheet for Patients: PinkCheek.be  Fact Sheet for Healthcare Providers: GravelBags.it  This test is not yet approved or cleared by the [place] FDA and  has been authorized for detection and/or diagnosis of SARS-CoV-2 by  FDA under an Emergency Use Authorization (EUA). This EUA will remain  in effect (meaning this test can be used) for the duration of the  Covid-19 declaration under Section 564(b)(1) of the Act, 21  U.S.C. section 360bbb-3(b)(1), unless the authorization is  terminated or revoked. Performed at [facility], [address] [place], [place] [address]   Blood Culture (routine x 2)     Status: None   Collection Time: 11/17/19  2:06 PM   Specimen: BLOOD  Result Value Ref Range Status   Specimen Description BLOOD BLOOD RIGHT HAND  Final   Special Requests   Final    BOTTLES DRAWN AEROBIC AND ANAEROBIC Blood Culture results may not be optimal due to an inadequate volume of blood received in culture bottles   Culture   Final    NO GROWTH 5 DAYS Performed at [facility], [address], [place], [place] [address]    Report Status  11/22/2019 FINAL  Final  Blood Culture (routine x 2)     Status: None   Collection Time: 11/17/19  2:06 PM   Specimen: BLOOD  Result Value Ref Range Status   Specimen Description BLOOD BLOOD RIGHT HAND  Final   Special Requests   Final    BOTTLES DRAWN AEROBIC AND ANAEROBIC Blood Culture adequate volume   Culture   Final    NO GROWTH 5 DAYS Performed at [facility], [address] [place], [place] [address]    Report Status  11/22/2019 FINAL  Final  MRSA PCR Screening     Status: Abnormal   Collection Time: 11/18/19  1:03 AM   Specimen: Nasopharyngeal  Result Value Ref Range Status   MRSA by PCR POSITIVE (A) NEGATIVE Corrected    Comment:        The GeneXpert MRSA Assay (FDA approved for NASAL specimens only), is one component of a comprehensive MRSA colonization surveillance program. It is not intended to diagnose MRSA infection nor to guide or monitor treatment for MRSA infections. RESULT CALLED TO, READ BACK BY AND VERIFIED WITH: CYNTHIA VASQUEZ @0245  ON 11/18/19 SKL Performed at [facility], [address] [place], [place] [address] CORRECTED ON 10/29 AT 0249: PREVIOUSLY REPORTED AS POSITIVE        The GeneXpert MRSA Assay (FDA approved for NASAL specimens only), is one component of a comprehensive MRSA colonization surveillance program. It is not intended to diagnose MRSA infection  nor to guide or monitor treatment for MRSA infections. CYNTHIA VASQUEZ @0245  ON 11/18/19 SKL       Imaging Studies   No results found.   Medications   Scheduled Meds: . aspirin EC  81 mg Oral Daily  . Chlorhexidine Gluconate Cloth  6 each Topical Daily  . collagenase   Topical Daily  . [START ON 11/25/2019] epoetin (EPOGEN/PROCRIT) injection  10,000 Units Intravenous Q M,W,F-HD  . feeding supplement  237 mL Oral TID BM  . ferrous sulfate  325 mg Oral BID  . heparin  5,000 Units Subcutaneous Q8H  . hydrocortisone sod succinate (SOLU-CORTEF) inj  50 mg Intravenous Q6H  . mouth rinse  15 mL Mouth Rinse BID  . midodrine  20 mg Oral TID  . pantoprazole (PROTONIX) IV  40 mg Intravenous Q12H  . sodium chloride flush  3 mL Intravenous Q12H   Continuous Infusions: . sodium chloride 250 mL (11/23/19 2001)  . sodium chloride Stopped (11/23/19 1105)  .  ceFAZolin (ANCEF) IV 1 g (11/23/19 2003)       LOS: 6 days    Time spent: 35 minutes with > 50% spent in coordination of care and direct patient  contact.    Ezekiel Slocumb, DO [facility]  11/23/2019, 8:51 PM    If 7PM-7AM, please contact night-coverage. How to contact the [facility] Attending or Consulting provider [place] or covering provider during after hours [place], for this patient?    1. Check the care team in [facility] and look for a) attending/consulting [facility] provider listed and b) the [facility] team listed 2. Log into [email or web address] and use [place]'s universal password to access. If you do not have the password, please contact the hospital operator. 3. Locate the [facility] provider you are looking for under [facility] and page to a number that you can be directly reached. 4. If you still have difficulty reaching the provider, please page the [facility] (Director on Call) for the Hospitalists listed on amion  for assistance.

## 2019-11-23 NOTE — Progress Notes (Signed)
   11/23/19 1620  Clinical Encounter Type  Visited With Patient  Visit Type Initial  Referral From Nurse  Consult/Referral To Chaplain  As Chaplain Ridge Stabs was leaving he received a request from the nurse to stop in and see Pt. Chaplain Donovon Micheletti stopped in to visit with Pt. When she arrived he was texting and asked her to wait a minute. Pt continued texting, but asked chaplain a question regarding visitation. Chaplain explained that one person per day could visit him. He said his daughter and ex-wife were coming. His daughter was coming to get his keys to his house so she could get his phone charger and bring it back. Chaplain repeated only one person may visit per day. While Pt was texting, his dinner was placed on his table . Chaplain told him she was checking on him to see how he was doing. He said he needed someone to fix the bed so he could eat. Chaplain told nurse and she fixed the bed so Pt could reach his tray. Chaplain left Pt could eat his dinner.

## 2019-11-23 NOTE — Hospital Course (Addendum)
[name] is a 49 y.o. male with medical history significant for end-stage renal disease on hemodialysis on Tuesday, Thursday, Saturday schedule, chronic prednisone therapy in the setting of previous kidney transplant, chronic anemia with baseline hemoglobin 8-9, diverticulitis, GERD, who is admitted on 11/17/2019 with right lower extremity cellulitis after presenting to the ED from home with worsening right lower extremity pain and swelling with purulent discharge, and associated fever/chills, rigors and general malaise.    While holding for a bed in the ED, patient became hypotensive requiring pressors, and ICU level of care for septic shock secondary to right lower extremity cellulitis.       TRH assumed care of patient on 11/23/19.  BP stable off pressors.  Had been receiving CRRT in ICU with plan for return to hemodialysis 11/3.  BP stable after dialysis, patient transferred out of ICU to progressive unit.

## 2019-11-23 NOTE — Progress Notes (Signed)
Progress Note  Patient Name: [name] Date of Encounter: 11/23/2019  [facility] Cardiologist: New- Dr. Quentin Ore  Subjective   Denies palpitations or chest pain.  Would like some oxygen which he is dialysis.  Currently satting 98 to 100% on room air.  Telemetry reviewed, no evidence of wide-complex tachycardia.  Inpatient Medications    Scheduled Meds: . aspirin EC  81 mg Oral Daily  . Chlorhexidine Gluconate Cloth  6 each Topical Daily  . collagenase   Topical Daily  . feeding supplement  237 mL Oral TID BM  . ferrous sulfate  325 mg Oral BID  . heparin  5,000 Units Subcutaneous Q8H  . hydrocortisone sod succinate (SOLU-CORTEF) inj  50 mg Intravenous Q6H  . mouth rinse  15 mL Mouth Rinse BID  . midodrine  20 mg Oral TID  . pantoprazole (PROTONIX) IV  40 mg Intravenous Q12H  . sodium chloride flush  3 mL Intravenous Q12H   Continuous Infusions: .  prismasol BGK 4/2.5 500 mL/hr at 11/22/19 0756  .  prismasol BGK 4/2.5 500 mL/hr at 11/22/19 0756  . sodium chloride    . sodium chloride 10 mL/hr at 11/23/19 0600  .  ceFAZolin (ANCEF) IV Stopped (11/22/19 2231)  . prismasol BGK 4/2.5 2,000 mL/hr at 11/22/19 1531   PRN Meds: sodium chloride, acetaminophen **OR** acetaminophen, atropine, dextrose, heparin, heparin sodium (porcine), HYDROcodone-acetaminophen, HYDROmorphone (DILAUDID) injection, melatonin, sodium chloride, sodium chloride flush, traZODone   Vital Signs    Vitals:   11/23/19 0530 11/23/19 0600 11/23/19 0800 11/23/19 0819  BP:  121/63    Pulse: (!) 48     Resp: 13 10    Temp:   98.2 F (36.8 C)   TempSrc:   Oral   SpO2: 100%  96% 94%  Weight:      Height:        Intake/Output Summary (Last 24 hours) at 11/23/2019 1056 Last data filed at 11/23/2019 0800 Gross per 24 hour  Intake 866.99 ml  Output 966 ml  Net -99.01 ml   Last 3 Weights 11/23/2019 11/22/2019 11/21/2019  Weight (lbs) 220 lb 7.4 oz 218 lb 7.6 oz 213 lb 10 oz  Weight (kg) 100 kg  99.1 kg 96.9 kg      Telemetry    Sinus bradycardia, heart rate 52- Personally Reviewed  ECG    No new tracing obtained- Personally Reviewed  Physical Exam   GEN: No acute distress.   Neck: No JVD Cardiac:  Bradycardic, regular.  Respiratory: Clear to auscultation bilaterally. GI: Soft, nontender, non-distended  MS: No edema; right lower extremity dressing noted Neuro:  Nonfocal  Psych: Normal affect   Labs    High Sensitivity Troponin:   Recent Labs  Lab 11/18/19 2200 11/19/19 0600 11/22/19 0901 11/22/19 1359  TROPONINIHS 96* 81* 24* 24*      Chemistry Recent Labs  Lab 11/17/19 1405 11/17/19 1405 11/18/19 0500 11/18/19 2030 11/22/19 0522 11/22/19 1628 11/23/19 0416  NA 139   < > 136   < > 133*  133* 135 133*  134*  K 4.1   < > 4.5   < > 3.6  3.6 3.9 3.9  3.9  CL 97*   < > 100   < > 99  98 99 99  98  CO2 27   < > 25   < > 23  24 24 25  23   GLUCOSE 61*   < > 61*   < > 118*  119* 125* 107*  109*  BUN 18   < > 25*   < > 24*  24* 21* 26*  26*  CREATININE 4.17*   < > 4.80*   < > 2.17*  2.26* 1.81* 2.35*  2.24*  CALCIUM 9.5   < > 8.9   < > 9.9  9.9 9.6 9.9  10.1  PROT 7.2  --  6.1*  --   --   --   --   ALBUMIN 3.4*   < > 2.8*   < > 2.8* 2.6* 2.6*  AST 26  --  17  --   --   --   --   ALT 8  --  10  --   --   --   --   ALKPHOS 129*  --  117  --   --   --   --   BILITOT 1.6*  --  2.1*  --   --   --   --   GFRNONAA 17*   < > 14*   < > 36*  34* 45* 33*  35*  ANIONGAP 15   < > 11   < > 11  11 12 9  13    < > = values in this interval not displayed.     Hematology Recent Labs  Lab 11/21/19 0330 11/22/19 0522 11/23/19 0416  WBC 17.0* 12.8* 13.8*  RBC 2.88* 2.85* 2.72*  HGB 8.4* 8.3* 8.0*  HCT 25.5* 24.9* 25.0*  MCV 88.5 87.4 91.9  MCH 29.2 29.1 29.4  MCHC 32.9 33.3 32.0  RDW 16.0* 16.1* 16.3*  PLT 131* 104* 116*    BNPNo results for input(s): BNP, PROBNP in the last 168 hours.   DDimer No results for input(s): DDIMER in the last  168 hours.   Radiology    No results found.  Cardiac Studies   TTE 10/2019 1. Left ventricular ejection fraction, by estimation, is 65 to 70%. Left  ventricular ejection fraction by PLAX is 64 %. The left ventricle has  normal function. The left ventricle has no regional wall motion  abnormalities. Left ventricular diastolic  function could not be evaluated. There is the interventricular septum is  flattened in systole and diastole, consistent with right ventricular  pressure and volume overload.  2. Right ventricular systolic function is normal. The right ventricular  size is normal.  3. The mitral valve is grossly normal. No evidence of mitral valve  regurgitation.  4. The aortic valve is normal in structure. Aortic valve regurgitation is  not visualized.   Patient Profile     49 y.o. male history of end-stage renal disease on dialysis, previous renal transplant, lupus, admitted due to cellulitis being seen due to wide-complex tachycardia.  Assessment & Plan    1.  Wide-complex tachycardia -No recurrence over the past 48 hours. -Diffuse amiodarone discontinued due to bradycardia. -Evaluated by EP, input appreciated.  Sepsis likely driving tachycardia. -Potassium and magnesium within normal limits. -Systolic function normal. -Continue to monitor on telemetry for arrhythmia recurrence while in-house.  No additional cardiac testing planned at this time.  2.  Paroxysmal atrial fib -Currently in sinus bradycardia -CHA2DS2-VASc of 0 -We will not recommend anticoagulation due to low CHA2DS2-VASc score, anemia, thrombocytopenia.  3.  Cellulitis, sepsis -Management as per ICU team  Total encounter time 35 minutes  Greater than 50% was spent in counseling and coordination of care with the patient     Signed, Kate Sable, MD  11/23/2019, 10:56  AM

## 2019-11-23 NOTE — Progress Notes (Signed)
Pt back from HD, per dialysis RN, 1.5L off. Vital signs stable, order received from Dr. Arbutus Ped for patient to go to PCU.

## 2019-11-23 NOTE — Progress Notes (Addendum)
PT Cancellation Note  Patient Details Name: [name] MRN: [id number] DOB: 1970/10/13   Cancelled Treatment:    Reason Eval/Treat Not Completed: Medical issues which prohibited therapy (Consult received and chart reviewed.  Per notes, patient remains on CRRT; possible transition to HD this date.  Contraindicated for PT services while CRRT running.  Will continue to follow for transition to HD and initiate PT evaluation as appropriate.)   Of note, patient noted with L temp dialysis catheter per chart; will continue on bedrest with minimal movement of L hip until catheter removed.  Donna Snooks H. Owens Shark, PT, DPT, NCS 11/23/19, 8:28 AM [phone]

## 2019-11-23 NOTE — Progress Notes (Signed)
[facility]  ROUNDING NOTE   Subjective:   Confused this morning.   Seen and examined on hemodialysis treatment. Tolerating treatment well.     Objective:  Vital signs in last 24 hours:  Temp:  [98.2 F (36.8 C)] 98.2 F (36.8 C) (11/03 0800) Pulse Rate:  [30-131] 60 (11/03 0900) Resp:  [10-27] 12 (11/03 0900) BP: (104-137)/(51-86) 104/56 (11/03 0900) SpO2:  [66 %-100 %] 97 % (11/03 0900) Weight:  [100 kg] 100 kg (11/03 0500)  Weight change: 0.9 kg Filed Weights   11/21/19 0314 11/22/19 0246 11/23/19 0500  Weight: 96.9 kg 99.1 kg 100 kg    Intake/Output: I/O last 3 completed shifts: In: 2107.8 [P.O.:608; I.V.:1299.7; IV Piggyback:200.1] Out: 2773 [Other:2773]   Intake/Output this shift:  Total I/O In: 70 [P.O.:70] Out: -   Physical Exam: General: Laying in bed  Head: Normocephalic, atraumatic. Moist oral mucosal membranes  Eyes: Anicteric, PERRL  Neck: Supple, trachea midline  Lungs:  Clear to auscultation  Heart: Irregular , bradycardia  Abdomen:  Soft, nontender  Extremities:  + peripheral edema. Right lower extremity induration  Neurologic: Waxing and waning   Skin: +induration  Access: Left AVF, left temp HD catheter ICU 98/92    Basic Metabolic Panel: Recent Labs  Lab 11/19/19 0600 11/19/19 1645 11/20/19 0509 11/20/19 1610 11/21/19 0330 11/21/19 0330 11/21/19 1709 11/21/19 1709 11/22/19 0522 11/22/19 1628 11/23/19 0416  NA 131*   < > 133*  134*   < > 133*  132*  --  132*  --  133*  133* 135 133*  134*  K 5.0   < > 4.0  4.0   < > 3.8  3.7  --  3.6  --  3.6  3.6 3.9 3.9  3.9  CL 96*   < > 98  98   < > 98  97*  --  99  --  99  98 99 99  98  CO2 25   < > 24  23   < > 25  25  --  23  --  23  24 24 25  23   GLUCOSE 122*   < > 112*  108*   < > 112*  112*  --  128*  --  118*  119* 125* 107*  109*  BUN 34*   < > 25*  24*   < > 28*  28*  --  30*  --  24*  24* 21* 26*  26*  CREATININE 5.68*   < > 3.28*  3.30*   < >  2.97*  3.05*  --  2.74*  --  2.17*  2.26* 1.81* 2.35*  2.24*  CALCIUM 9.0   < > 9.9  9.9   < > 10.4*  10.3   < > 9.9   < > 9.9  9.9 9.6 9.9  10.1  MG 2.4  --  2.1  --  2.2  --   --   --  2.6*  --  2.5*  PHOS 4.6   < > 3.4  3.6   < > 3.1  3.2  --  2.3*  --  1.7*  1.7* 1.6* 2.4*  2.4*   < > = values in this interval not displayed.    Liver Function Tests: Recent Labs  Lab 11/17/19 1405 11/17/19 1405 11/18/19 0500 11/19/19 1645 11/21/19 0330 11/21/19 1709 11/22/19 0522 11/22/19 1628 11/23/19 0416  AST 26  --  17  --   --   --   --   --   --  ALT 8  --  10  --   --   --   --   --   --   ALKPHOS 129*  --  117  --   --   --   --   --   --   BILITOT 1.6*  --  2.1*  --   --   --   --   --   --   PROT 7.2  --  6.1*  --   --   --   --   --   --   ALBUMIN 3.4*   < > 2.8*   < > 2.8* 2.6* 2.8* 2.6* 2.6*   < > = values in this interval not displayed.   No results for input(s): LIPASE, AMYLASE in the last 168 hours. No results for input(s): AMMONIA in the last 168 hours.  CBC: Recent Labs  Lab 11/19/19 0600 11/20/19 0509 11/21/19 0330 11/22/19 0522 11/23/19 0416  WBC 20.2* 15.5* 17.0* 12.8* 13.8*  NEUTROABS 18.6* 14.1* 15.2* 10.8* 11.1*  HGB 8.4* 8.4* 8.4* 8.3* 8.0*  HCT 26.1* 25.3* 25.5* 24.9* 25.0*  MCV 91.6 87.5 88.5 87.4 91.9  PLT 102* 114* 131* 104* 116*    Cardiac Enzymes: Recent Labs  Lab 11/17/19 1405  CKTOTAL 136    BNP: Invalid input(s): POCBNP  CBG: Recent Labs  Lab 11/19/19 1540 11/19/19 2000 11/20/19 0004 11/20/19 0354 11/20/19 0727  GLUCAP 84 111* 108* 119* 109*    Microbiology: Results for orders placed or performed during the hospital encounter of 11/17/19  Wound or Superficial Culture     Status: Abnormal   Collection Time: 11/17/19  2:03 PM   Specimen: Wound  Result Value Ref Range Status   Specimen Description   Final    WOUND Performed at [facility], [address], [place], [place] [address]    Special Requests    Final    RL Performed at [facility], [address] [place], [place] [address]    Gram Stain   Final    NO WBC SEEN ABUNDANT GRAM NEGATIVE RODS FEW GRAM POSITIVE COCCI Performed at [facility], [place] [address] [place], [place] [address]    Culture MULTIPLE ORGANISMS PRESENT, NONE PREDOMINANT (A)  Final   Report Status 11/20/2019 FINAL  Final  Respiratory Panel by RT PCR (Flu A&B, Covid) - Nasopharyngeal Swab     Status: None   Collection Time: 11/17/19  2:05 PM   Specimen: Nasopharyngeal Swab  Result Value Ref Range Status   SARS Coronavirus 2 by RT PCR NEGATIVE NEGATIVE Final    Comment: (NOTE) SARS-CoV-2 target nucleic acids are NOT DETECTED.  The SARS-CoV-2 RNA is generally detectable in upper respiratoy specimens during the acute phase of infection. The lowest concentration of SARS-CoV-2 viral copies this assay can detect is 131 copies/mL. A negative result does not preclude SARS-Cov-2 infection and should not be used as the sole basis for treatment or other patient management decisions. A negative result may occur with  improper specimen collection/handling, submission of specimen other than nasopharyngeal swab, presence of viral mutation(s) within the areas targeted by this assay, and inadequate number of viral copies (<131 copies/mL). A negative result must be combined with clinical observations, patient history, and epidemiological information. The expected result is Negative.  Fact Sheet for Patients:  PinkCheek.be  Fact Sheet for Healthcare Providers:  GravelBags.it  This test is no t yet approved or cleared by the [place] FDA  and  has been authorized for detection and/or diagnosis of SARS-CoV-2 by FDA under an Emergency Use Authorization (EUA). This EUA will remain  in effect (meaning this test can be used) for the duration of the COVID-19 declaration under Section 564(b)(1) of  the Act, 21 U.S.C. section 360bbb-3(b)(1), unless the authorization is terminated or revoked sooner.     Influenza A by PCR NEGATIVE NEGATIVE Final   Influenza B by PCR NEGATIVE NEGATIVE Final    Comment: (NOTE) The Xpert Xpress SARS-CoV-2/FLU/RSV assay is intended as an aid in  the diagnosis of influenza from Nasopharyngeal swab specimens and  should not be used as a sole basis for treatment. Nasal washings and  aspirates are unacceptable for Xpert Xpress SARS-CoV-2/FLU/RSV  testing.  Fact Sheet for Patients: PinkCheek.be  Fact Sheet for Healthcare Providers: GravelBags.it  This test is not yet approved or cleared by the [place] FDA and  has been authorized for detection and/or diagnosis of SARS-CoV-2 by  FDA under an Emergency Use Authorization (EUA). This EUA will remain  in effect (meaning this test can be used) for the duration of the  Covid-19 declaration under Section 564(b)(1) of the Act, 21  U.S.C. section 360bbb-3(b)(1), unless the authorization is  terminated or revoked. Performed at [facility], [address] [place], [place] [address]   Blood Culture (routine x 2)     Status: None   Collection Time: 11/17/19  2:06 PM   Specimen: BLOOD  Result Value Ref Range Status   Specimen Description BLOOD BLOOD RIGHT HAND  Final   Special Requests   Final    BOTTLES DRAWN AEROBIC AND ANAEROBIC Blood Culture results may not be optimal due to an inadequate volume of blood received in culture bottles   Culture   Final    NO GROWTH 5 DAYS Performed at [facility], [address] [place], [place] [address]    Report Status 11/22/2019 FINAL  Final  Blood Culture (routine x 2)     Status: None   Collection Time: 11/17/19  2:06 PM   Specimen: BLOOD  Result Value Ref Range Status   Specimen Description BLOOD BLOOD RIGHT HAND  Final   Special Requests   Final    BOTTLES DRAWN AEROBIC AND  ANAEROBIC Blood Culture adequate volume   Culture   Final    NO GROWTH 5 DAYS Performed at [facility], [address] [place], [place] [address]    Report Status 11/22/2019 FINAL  Final  MRSA PCR Screening     Status: Abnormal   Collection Time: 11/18/19  1:03 AM   Specimen: Nasopharyngeal  Result Value Ref Range Status   MRSA by PCR POSITIVE (A) NEGATIVE Corrected    Comment:        The GeneXpert MRSA Assay (FDA approved for NASAL specimens only), is one component of a comprehensive MRSA colonization surveillance program. It is not intended to diagnose MRSA infection nor to guide or monitor treatment for MRSA infections. RESULT CALLED TO, READ BACK BY AND VERIFIED WITH: CYNTHIA VASQUEZ @0245  ON 11/18/19 SKL Performed at [facility], [address] [place], [place] [address] CORRECTED ON 10/29 AT 0249: PREVIOUSLY REPORTED AS POSITIVE        The GeneXpert MRSA Assay (FDA approved for NASAL specimens only), is one component of a comprehensive MRSA colonization surveillance program. It is not intended to diagnose MRSA infection  nor to guide or monitor treatment for MRSA infections. CYNTHIA VASQUEZ @0245  ON 11/18/19  SKL     Coagulation Studies: No results for input(s): LABPROT, INR in the last 72 hours.  Urinalysis: No results for input(s): COLORURINE, LABSPEC, PHURINE, GLUCOSEU, HGBUR, BILIRUBINUR, KETONESUR, PROTEINUR, UROBILINOGEN, NITRITE, LEUKOCYTESUR in the last 72 hours.  Invalid input(s): APPERANCEUR    Imaging: No results found.   Medications:   . sodium chloride    . sodium chloride 10 mL/hr at 11/23/19 0600  .  ceFAZolin (ANCEF) IV Stopped (11/22/19 2231)   . aspirin EC  81 mg Oral Daily  . Chlorhexidine Gluconate Cloth  6 each Topical Daily  . collagenase   Topical Daily  . feeding supplement  237 mL Oral TID BM  . ferrous sulfate  325 mg Oral BID  . heparin  5,000 Units Subcutaneous Q8H  . hydrocortisone sod succinate  (SOLU-CORTEF) inj  50 mg Intravenous Q6H  . mouth rinse  15 mL Mouth Rinse BID  . midodrine  20 mg Oral TID  . pantoprazole (PROTONIX) IV  40 mg Intravenous Q12H  . sodium chloride flush  3 mL Intravenous Q12H   sodium chloride, acetaminophen **OR** acetaminophen, atropine, dextrose, heparin, heparin sodium (porcine), HYDROcodone-acetaminophen, HYDROmorphone (DILAUDID) injection, melatonin, sodium chloride, sodium chloride flush, traZODone  Assessment/ Plan:  [name] is a 49 y.o. black male with end stage renal disease on hemodialysis, history of kidney transplant, lupus nephritis, and hypotension who was admitted to [facility] on 11/17/2019 for Cellulitis of right lower extremity [M57.846] ESRD on hemodialysis ([place]) [N18.6, Z99.2] Hypotension [I95.9] Severe sepsis ([place]) [A41.9, R65.20]  [facility] M[facility] Left AVF 93kg   1. End stage renal disease: seen and examined on hemodialysis treatment.  CRRT from 10/30-10/31, Then 11/1-11/2 - Monitor daily for dialysis need  2. Sepsis/hypotension with right lower extremity cellulitis and leukocytosis. Blood cultures with no growth. Leukocytosis improving.  - stress dose hydrocortisone - broad spectrum antibiotics: cefazolin - Continue midodrine.   3. Anemia with chronic kidney disease: with thrombocytopenia. Hemoglobin 8 - EPO with HD treatment.   4. Secondary Hyperparathyroidism: not currently on binders.    LOS: 6 [name] 11/3/202112:13 PM

## 2019-11-23 NOTE — Progress Notes (Signed)
   11/23/19 1845  Assess: MEWS Score  Temp 98.6 F (37 C)  BP (!) 126/50  Pulse Rate 72  ECG Heart Rate (!) 54  Resp 20  Level of Consciousness Alert  SpO2 99 %  O2 Device Room Air  Patient Activity (if Appropriate) In bed   Pt arrived to room 248. Call bell within reach. Pts ex wife at bedside. Pt has a cell phone and wallet at bedside. Offered the pt and ex wife to take the wallet with the ex wife. The ex wife said she would take the wallet home but not the cell phone. She stated the patient will need his cell phone. Informed the pt and the ex wife that if the belongings got missing the hospital would not be responsible for the belongings. They both stated they understood.

## 2019-11-24 DIAGNOSIS — Z992 Dependence on renal dialysis: Secondary | ICD-10-CM

## 2019-11-24 DIAGNOSIS — A419 Sepsis, unspecified organism: Secondary | ICD-10-CM | POA: Diagnosis not present

## 2019-11-24 DIAGNOSIS — L03115 Cellulitis of right lower limb: Secondary | ICD-10-CM | POA: Diagnosis not present

## 2019-11-24 DIAGNOSIS — I472 Ventricular tachycardia: Secondary | ICD-10-CM | POA: Diagnosis not present

## 2019-11-24 DIAGNOSIS — I480 Paroxysmal atrial fibrillation: Secondary | ICD-10-CM | POA: Diagnosis not present

## 2019-11-24 DIAGNOSIS — R600 Localized edema: Secondary | ICD-10-CM

## 2019-11-24 DIAGNOSIS — D631 Anemia in chronic kidney disease: Secondary | ICD-10-CM

## 2019-11-24 DIAGNOSIS — N186 End stage renal disease: Secondary | ICD-10-CM

## 2019-11-24 LAB — CBC
HCT: 24.9 % — ABNORMAL LOW (ref 39.0–52.0)
Hemoglobin: 7.9 g/dL — ABNORMAL LOW (ref 13.0–17.0)
MCH: 28.8 pg (ref 26.0–34.0)
MCHC: 31.7 g/dL (ref 30.0–36.0)
MCV: 90.9 fL (ref 80.0–100.0)
Platelets: 133 10*3/uL — ABNORMAL LOW (ref 150–400)
RBC: 2.74 MIL/uL — ABNORMAL LOW (ref 4.22–5.81)
RDW: 17.1 % — ABNORMAL HIGH (ref 11.5–15.5)
WBC: 15.9 10*3/uL — ABNORMAL HIGH (ref 4.0–10.5)
nRBC: 1 % — ABNORMAL HIGH (ref 0.0–0.2)

## 2019-11-24 LAB — RENAL FUNCTION PANEL
Albumin: 2.5 g/dL — ABNORMAL LOW (ref 3.5–5.0)
Anion gap: 10 (ref 5–15)
BUN: 24 mg/dL — ABNORMAL HIGH (ref 6–20)
CO2: 28 mmol/L (ref 22–32)
Calcium: 9.6 mg/dL (ref 8.9–10.3)
Chloride: 97 mmol/L — ABNORMAL LOW (ref 98–111)
Creatinine, Ser: 2.52 mg/dL — ABNORMAL HIGH (ref 0.61–1.24)
GFR, Estimated: 30 mL/min — ABNORMAL LOW (ref >60)
Glucose, Bld: 107 mg/dL — ABNORMAL HIGH (ref 70–99)
Phosphorus: 2.8 mg/dL (ref 2.5–4.6)
Potassium: 3.5 mmol/L (ref 3.5–5.1)
Sodium: 135 mmol/L (ref 135–145)

## 2019-11-24 LAB — BASIC METABOLIC PANEL
Anion gap: 10 (ref 5–15)
BUN: 25 mg/dL — ABNORMAL HIGH (ref 6–20)
CO2: 28 mmol/L (ref 22–32)
Calcium: 9.8 mg/dL (ref 8.9–10.3)
Chloride: 99 mmol/L (ref 98–111)
Creatinine, Ser: 2.45 mg/dL — ABNORMAL HIGH (ref 0.61–1.24)
GFR, Estimated: 31 mL/min — ABNORMAL LOW (ref >60)
Glucose, Bld: 105 mg/dL — ABNORMAL HIGH (ref 70–99)
Potassium: 3.5 mmol/L (ref 3.5–5.1)
Sodium: 137 mmol/L (ref 135–145)

## 2019-11-24 LAB — MAGNESIUM: Magnesium: 2.2 mg/dL (ref 1.7–2.4)

## 2019-11-24 MED ORDER — PREDNISONE 20 MG PO TABS
30.0000 mg | ORAL_TABLET | Freq: Every day | ORAL | Status: AC
  Administered 2019-11-27 – 2019-11-28 (×2): 30 mg via ORAL
  Filled 2019-11-24 (×2): qty 1

## 2019-11-24 MED ORDER — PREDNISONE 20 MG PO TABS
40.0000 mg | ORAL_TABLET | Freq: Every day | ORAL | Status: AC
  Administered 2019-11-25 – 2019-11-26 (×2): 40 mg via ORAL
  Filled 2019-11-24 (×2): qty 2

## 2019-11-24 MED ORDER — PREDNISONE 10 MG PO TABS
10.0000 mg | ORAL_TABLET | Freq: Every day | ORAL | Status: DC
  Administered 2019-12-01 – 2019-12-05 (×4): 10 mg via ORAL
  Filled 2019-11-24 (×4): qty 1

## 2019-11-24 MED ORDER — PREDNISONE 20 MG PO TABS
20.0000 mg | ORAL_TABLET | Freq: Every day | ORAL | Status: AC
  Administered 2019-11-29 – 2019-11-30 (×2): 20 mg via ORAL
  Filled 2019-11-24 (×2): qty 1

## 2019-11-24 NOTE — Evaluation (Signed)
Physical Therapy Evaluation Patient Details Name: [name] MRN: [id number] DOB: 09/23/1970 Today's Date: 11/24/2019   History of Present Illness  presented to ER secondary to R LE swelling, pain; admitted for management of severe cellulitis of R LE.  Hospital course additionally significant for hypotension, requiring transfer to CCU and pressor support; episode of vtach with synchronized cardioversion (10/30), CRRT (via temp dialysis cath, left) 10/30-11/2.  L temp fem cath discontinued and patient cleared for participation with session 11/24/19.  Clinical Impression  Patient heard talking to someone in room as therapist entered doorway.  No one noted in room.  Patient reported to be looking for/talking to son when questioned, though son not present in room (and has not been on campus this date per nursing).  Patient generally oriented to self only; globally confused with difficulty attending to topic of conversation/focus of attention throughout session. Does follow simple commands with frequent reorientation to task; very pleasant and cooperative with good efforts noted towards all mobility tasks.  Globally weak and deconditioned, LE > UEs; requiring act assist for movement/repositioning of LEs with bed mobility and transfers.  Currently requiring heavy mod/max assist for supine/sit; close sup for unsupported siting balance; mod/max assist +2 for sit/stand from elevated bed surface; mod assist +2 with RW for lateral stepping edge of bed.  Demonstrates  lateral steps along edge of bed,very shuffling stepping pattern; signficant WBing bilat UEs. Maintains bilat knees in signficant hyperextension; poor eccentric control with stand to sit.  Very high risk for LE buckling, fall; recommend use of RW and +2 at all times.  May plan to attempt scoot/squat pivot transfer from bed/chair next date. Would benefit from skilled PT to address above deficits and promote optimal return to PLOF.; recommend transition  to acute inpatient rehab upon discharge for high-intensity, post-acute rehab services.       Follow Up Recommendations CIR    Equipment Recommendations  Rolling walker with 5" wheels;3in1 (PT)    Recommendations for Other Services       Precautions / Restrictions Precautions Precautions: Fall Precaution Comments: L AVF, R LE wound (ace bandaged) Restrictions Weight Bearing Restrictions: No      Mobility  Bed Mobility Overal bed mobility: Needs Assistance Bed Mobility: Supine to Sit;Sit to Supine     Supine to sit: Mod assist Sit to supine: Mod assist   General bed mobility comments: self-assists LEs in/out of bed with UEs; limited trunk control, requiring UE support for truncal elevation and control with movement transition    Transfers Overall transfer level: Needs assistance Equipment used: Rolling walker (2 wheeled) Transfers: Sit to/from Stand Sit to Stand: Mod assist;+2 physical assistance         General transfer comment: elevated bed surface; very heavy assist with bilat UEs; extensive physical assist for lift off, postural/trunk extension.  Difficulty with bilat hip extension; maintains bilat knees in significant hyperextension in closed-chain, standing position  Ambulation/Gait Ambulation/Gait assistance: Mod assist;+2 physical assistance Gait Distance (Feet): 4 Feet Assistive device: Rolling walker (2 wheeled)       General Gait Details: lateral steps along edge of bed,very shuffling stepping pattern; signficant WBing bilat UEs. Maintains bilat knees in signficant hyperextension; poor eccentric control with stand to sit.  Very high risk for LE buckling, fall; recommend use of RW and +2 at all times  Stairs            Wheelchair Mobility    Modified Rankin (Stroke Patients Only)  Balance Overall balance assessment: Needs assistance Sitting-balance support: No upper extremity supported;Feet supported Sitting balance-Leahy Scale:  Fair     Standing balance support: Bilateral upper extremity supported Standing balance-Leahy Scale: Poor                               Pertinent Vitals/Pain Pain Assessment: Faces Faces Pain Scale: Hurts even more Pain Location: R LE Pain Descriptors / Indicators: Aching;Grimacing;Moaning Pain Intervention(s): Limited activity within patient's tolerance;Monitored during session;Repositioned    Home Living Family/patient expects to be discharged to:: Private residence Living Arrangements: Alone   Type of Home: House Home Access: Ramped entrance     Home Layout: One level Home Equipment: None Additional Comments: Patient questionable historian; will verify with family as available    Prior Function Level of Independence: Independent         Comments: Per patient report, indep with ADLs, household and community mobilization without assist device; drives to/from dialysis MWF.  Patient questionable historian; will verify with family as available     Hand Dominance   Dominant Hand: Left    Extremity/Trunk Assessment   Upper Extremity Assessment Upper Extremity Assessment: Generalized weakness (grossly 4-/5 throughout; generally edematous)    Lower Extremity Assessment Lower Extremity Assessment: Generalized weakness (grossly 3+ to 4-/5 throughout; globally weak, but no focal asymmetry)       Communication   Communication: No difficulties  Cognition Arousal/Alertness: Awake/alert Behavior During Therapy: WFL for tasks assessed/performed Overall Cognitive Status: Impaired/Different from baseline                                 General Comments: oriented to self and year; believes location to be [place], month september.  Very easily distracted by external environment, difficulty sustaining focus of attention.  Suspect some degree of visual hallucination-patient talking to son believed to be in room as therapist entered room (son not  present at facility, confirmed with nursing)      General Comments      Exercises Other Exercises Other Exercises: Sit/stand x2 with RW, mod/max assist +2 from elevated bed surface Other Exercises: Lateral scooting edge of bed, close sup; very heavy use of bilat UEs to initiate/complete lift off and lateral movement. May be appropriate for scoot pivot transfers in subsequent sessions. Other Exercises: Orthostatic assessment (see vitals flowsheet for details); vitals stable and WFL with transition to upright.  No subjective/objective reports of orthostasis. Other Exercises: See OT evaluation/note for additional details of ADL activities.   Assessment/Plan    PT Assessment Patient needs continued PT services  PT Problem List Decreased strength;Decreased range of motion;Decreased activity tolerance;Decreased balance;Decreased mobility;Decreased coordination;Decreased cognition;Decreased knowledge of use of DME;Decreased safety awareness;Decreased knowledge of precautions;Impaired sensation;Decreased skin integrity;Pain       PT Treatment Interventions Therapeutic exercise;DME instruction;Gait training;Stair training;Functional mobility training;Therapeutic activities;Balance training;Cognitive remediation;Patient/family education;Neuromuscular re-education    PT Goals (Current goals can be found in the Care Plan section)  Acute Rehab PT Goals PT Goal Formulation: Patient unable to participate in goal setting Time For Goal Achievement: 12/08/19 Potential to Achieve Goals: Good    Frequency Min 2X/week   Barriers to discharge Decreased caregiver support      Co-evaluation PT/OT/SLP Co-Evaluation/Treatment: Yes Reason for Co-Treatment: Complexity of the patient's impairments (multi-system involvement);Necessary to address cognition/behavior during functional activity;For patient/therapist safety;To address functional/ADL transfers PT goals addressed during session: Mobility/safety with  mobility;Balance OT goals addressed during session: ADL's and self-care       AM-PAC PT "6 Clicks" Mobility  Outcome Measure Help needed turning from your back to your side while in a flat bed without using bedrails?: A Lot Help needed moving from lying on your back to sitting on the side of a flat bed without using bedrails?: A Lot Help needed moving to and from a bed to a chair (including a wheelchair)?: Total Help needed standing up from a chair using your arms (e.g., wheelchair or bedside chair)?: A Lot Help needed to walk in hospital room?: Total Help needed climbing 3-5 steps with a railing? : Total 6 Click Score: 9    End of Session Equipment Utilized During Treatment: Gait belt Activity Tolerance: Patient tolerated treatment well Patient left: in bed;with bed alarm set;with call bell/phone within reach Nurse Communication: Mobility status PT Visit Diagnosis: Muscle weakness (generalized) (M62.81);Difficulty in walking, not elsewhere classified (R26.2);Pain Pain - Right/Left: Right Pain - part of body: Leg    Time: 1450-1538 PT Time Calculation (min) (ACUTE ONLY): 48 min   Charges:   PT Evaluation $PT Eval Moderate Complexity: 1 Mod PT Treatments $Therapeutic Activity: 8-22 mins        Keno Caraway H. Owens Shark, PT, DPT, NCS 11/24/19, 4:01 PM [phone]

## 2019-11-24 NOTE — Progress Notes (Addendum)
Progress Note  Patient Name: [name] Date of Encounter: 11/24/2019  [facility] Cardiologist:  New, Dr. Quentin Ore  Subjective   No chest pain.  No shortness of breath.  No dizziness.  Discussed that review of telemetry showed pauses between beats up to 3+ seconds.  He denied sleeping at this time.  He reports he had leg pain at approximately 4 PM when trying to get up on his walker with PT.  He did not feel dizzy at this time.  He is at times confused.  Suspect he is not completely oriented.  He recalls his age and name.  After a delay, he is able to recall the president of the [place].  He read [facility] off of the white board in order to respond with his current location.  During her exam, he stated that he saw a woman leaning over a bed across from him and asked about it then realized that it was my reflection in the window.  Inpatient Medications    Scheduled Meds: . aspirin EC  81 mg Oral Daily  . Chlorhexidine Gluconate Cloth  6 each Topical Daily  . collagenase   Topical Daily  . [START ON 11/25/2019] epoetin (EPOGEN/PROCRIT) injection  10,000 Units Intravenous Q M,W,F-HD  . feeding supplement  237 mL Oral TID BM  . ferrous sulfate  325 mg Oral BID  . heparin  5,000 Units Subcutaneous Q8H  . hydrocortisone sod succinate (SOLU-CORTEF) inj  50 mg Intravenous Q6H  . mouth rinse  15 mL Mouth Rinse BID  . midodrine  20 mg Oral TID  . pantoprazole (PROTONIX) IV  40 mg Intravenous Q12H  . [START ON 11/25/2019] predniSONE  40 mg Oral Q breakfast   Followed by  . [START ON 11/27/2019] predniSONE  30 mg Oral Q breakfast   Followed by  . [START ON 11/29/2019] predniSONE  20 mg Oral Q breakfast   Followed by  . [START ON 12/01/2019] predniSONE  10 mg Oral Q breakfast  . sodium chloride flush  3 mL Intravenous Q12H   Continuous Infusions: . sodium chloride Stopped (11/23/19 2033)  . sodium chloride Stopped (11/23/19 1105)  .  ceFAZolin (ANCEF)  IV Stopped (11/23/19 2033)   PRN Meds: sodium chloride, acetaminophen **OR** acetaminophen, atropine, dextrose, heparin, heparin sodium (porcine), HYDROcodone-acetaminophen, HYDROmorphone (DILAUDID) injection, melatonin, sodium chloride, sodium chloride flush, traZODone   Vital Signs    Vitals:   11/24/19 0416 11/24/19 0418 11/24/19 0823 11/24/19 1120  BP:   124/65 (!) 109/51  Pulse: 65  60 (!) 50  Resp:   20 16  Temp:   98.2 F (36.8 C) 98.5 F (36.9 C)  TempSrc:   Oral Oral  SpO2: 100%  97% 100%  Weight:  99.7 kg    Height:        Intake/Output Summary (Last 24 hours) at 11/24/2019 1707 Last data filed at 11/24/2019 0300 Gross per 24 hour  Intake 168.56 ml  Output --  Net 168.56 ml   Last 3 Weights 11/24/2019 11/23/2019 11/23/2019  Weight (lbs) 219 lb 12.8 oz 217 lb 11.2 oz 220 lb 7.4 oz  Weight (kg) 99.701 kg 98.748 kg 100 kg      Telemetry    Atrial fibrillation, SB, rates at times in the 30s, frequent pauses, most sinus pauses between 2 to 3 seconds.    Earlier significant pauses this afternoon 3.35 seconds and 3.39 seconds at approximately 16:25 - Personally Reviewed  ECG  No new tracings- Personally Reviewed  Physical Exam   GEN: No acute distress.   Neck: No JVD.  Unable to assess JVP. Cardiac:  IR IR and bradycardic, no murmurs, rubs, or gallops.  Respiratory:  Clear anteriorly, unable to auscultate posterior due to patient's pain in his leg. GI: Soft, nontender, non-distended  MS:  Moderate left lower extremity edema, right lower extremity is wrapped in Ace bandage; No deformity. Neuro:  Nonfocal, at times confused. Psych: Normal affect   Labs    High Sensitivity Troponin:   Recent Labs  Lab 11/18/19 2200 11/19/19 0600 11/22/19 0901 11/22/19 1359  TROPONINIHS 96* 81* 24* 24*      Chemistry Recent Labs  Lab 11/18/19 0500 11/18/19 2030 11/22/19 1628 11/23/19 0416 11/24/19 0545  NA 136   < > 135 133*  134* 135  137  K 4.5   < > 3.9 3.9   3.9 3.5  3.5  CL 100   < > 99 99  98 97*  99  CO2 25   < > 24 25  23 28  28   GLUCOSE 61*   < > 125* 107*  109* 107*  105*  BUN 25*   < > 21* 26*  26* 24*  25*  CREATININE 4.80*   < > 1.81* 2.35*  2.24* 2.52*  2.45*  CALCIUM 8.9   < > 9.6 9.9  10.1 9.6  9.8  PROT 6.1*  --   --   --   --   ALBUMIN 2.8*   < > 2.6* 2.6* 2.5*  AST 17  --   --   --   --   ALT 10  --   --   --   --   ALKPHOS 117  --   --   --   --   BILITOT 2.1*  --   --   --   --   GFRNONAA 14*   < > 45* 33*  35* 30*  31*  ANIONGAP 11   < > 12 9  13 10  10    < > = values in this interval not displayed.     Hematology Recent Labs  Lab 11/22/19 0522 11/23/19 0416 11/24/19 0545  WBC 12.8* 13.8* 15.9*  RBC 2.85* 2.72* 2.74*  HGB 8.3* 8.0* 7.9*  HCT 24.9* 25.0* 24.9*  MCV 87.4 91.9 90.9  MCH 29.1 29.4 28.8  MCHC 33.3 32.0 31.7  RDW 16.1* 16.3* 17.1*  PLT 104* 116* 133*    BNPNo results for input(s): BNP, PROBNP in the last 168 hours.   DDimer No results for input(s): DDIMER in the last 168 hours.   Radiology    No results found.  Cardiac Studies   TTE 10/2019 1. Left ventricular ejection fraction, by estimation, is 65 to 70%. Left  ventricular ejection fraction by PLAX is 64 %. The left ventricle has  normal function. The left ventricle has no regional wall motion  abnormalities. Left ventricular diastolic  function could not be evaluated. There is the interventricular septum is  flattened in systole and diastole, consistent with right ventricular  pressure and volume overload.  2. Right ventricular systolic function is normal. The right ventricular  size is normal.  3. The mitral valve is grossly normal. No evidence of mitral valve  regurgitation.  4. The aortic valve is normal in structure. Aortic valve regurgitation is  not visualized.   Patient Profile     49 y.o. male with history of end-stage  renal disease on dialysis, previous renal transplant, lupus, and admitted due  to cellulitis and seen for wide-complex tachycardia and significant bradycardia with pauses 3+ seconds.  Assessment & Plan    Tachybradycardia, concern for SSS  H/o wide-complex tachycardia  Paroxysmal atrial fibrillation  Bradycardic rates with pauses 3+ seconds --Suspect he is not completely oriented.  This was noted on previous rounding cardiology notes.  Etiology of disorientation unknown at this time.  It is unclear whether it is 2/2 his bradycardic rates and pauses versus sepsis/cellulitis. --Initially consulted for earlier episode of WCT without recurrence since that time.  EP evaluated over the weekend for [facility] and assessment at that time was [facility] most likely reflected outflow tract ventricular tachycardia, driven by sepsis.  Recommendation was to continue to treat sepsis.  Amiodarone was discontinued due to bradycardia.  It was recommended that amiodarone and beta-blockers be avoided in the setting of bradycardia and absence of recurrent VT.  Since initial EP assessment of patient, he has persisted with bradycardic rate with telemetry now showing sinus pauses 2.0-3.39 seconds, suspicious for SSS. Echo with normal EF as above.  Avoid AV nodal blocking agents.  Avoid QT prolonging agents.  Maintain electrolytes at goal.  Closely monitor BP to ensure proper perfusion with bradycardic rates.  Consider repeat EP consultation given pauses up to 3+ seconds and current mental status.  Concern for SSS. Discussed with MD.  Diven his current sepsis, he is not an ideal candidate for any device placement. Will defer to rounding MD regarding EP consultation.   Continue to monitor on telemetry during admission.  Paroxysmal atrial fibrillation with bradycardic ventricular rate --Denies tachypalpitations. --Ventricular rates well controlled bradycardic. --CHADS2VASc score of 0.  Defer anticoagulation in the setting of low chads vas score and chronic anemia/thrombocytopenia.  Chest pain --No  further report of chest pain.  HS Tn minimally elevated. EKG without acute changes. As previously noted, preference is to defer further ischemic work-up and preference for treatment of underlying infection.  Cellulitis, sepsis --Recommend ongoing treatment of underlying sepsis.  For questions or updates, please contact [place] Please consult [email or web address] for contact info under        Signed, Arvil Chaco, PA-C  11/24/2019, 5:07 PM

## 2019-11-24 NOTE — Progress Notes (Signed)
Inpatient Rehab Admissions Coordinator Note:   Per OT recommendations, pt was screened for CIR candidacy by Shann Medal, PT, DPT.  At this time we are recommending a CIR consult and I will place an order per our protocol.  Please contact me with questions.   Shann Medal, PT, DPT [phone] 11/24/19 4:39 PM

## 2019-11-24 NOTE — TOC Initial Note (Signed)
Transition of Care [facility]) - Initial/Assessment Note    Patient Details  Name: [name] MRN: [id number] Date of Birth: 09-Oct-1970  Transition of Care [facility]) CM/SW Contact:    Victorino Dike, RN Phone Number: 11/24/2019, 9:43 AM  Clinical Narrative:                  Spoke with patient while he was in his room.  He reports living at home alone, He does drive, but if needed his daughter or ex-wife could help him with errands and at home.  He reports having oxygen tanks from a company in [place].  He is not currently using oxygen at home.   Patient is open to [facility] or SNF if approprate at discharge.   Expected Discharge Plan: [place] Barriers to Discharge: Continued Medical Work up   Patient Goals and CMS Choice Patient states their goals for this hospitalization and ongoing recovery are:: return home CMS Medicare.gov Compare Post Acute Care list provided to:: Patient Choice offered to / list presented to : Patient  Expected Discharge Plan and Services Expected Discharge Plan: [place]   Discharge Planning Services: CM Consult Post Acute Care Choice: [place], [place] Living arrangements for the past 2 months: Single Family Home        Prior Living Arrangements/Services Living arrangements for the past 2 months: Single Family Home Lives with:: Self Patient language and need for interpreter reviewed:: Yes Do you feel safe going back to the place where you live?: Yes      Need for Family Participation in Patient Care: No (Comment) Care giver support system in place?: Yes (comment) Current home services: DME (has oxygen in home (not using currently)) Criminal Activity/Legal Involvement Pertinent to Current Situation/Hospitalization: No - Comment as needed  Activities of Daily Living Home Assistive Devices/Equipment: None ADL Screening (condition at time of admission) Patient's cognitive ability adequate to safely  complete daily activities?: Yes Is the patient deaf or have difficulty hearing?: No Does the patient have difficulty seeing, even when wearing glasses/contacts?: No Does the patient have difficulty concentrating, remembering, or making decisions?: No Patient able to express need for assistance with ADLs?: Yes Does the patient have difficulty dressing or bathing?: No Independently performs ADLs?: Yes (appropriate for developmental age) Does the patient have difficulty walking or climbing stairs?: Yes Weakness of Legs: Both Weakness of Arms/Hands: None        Emotional Assessment   Attitude/Demeanor/Rapport: Self-Confident, Engaged Affect (typically observed): Accepting, Appropriate Orientation: : Oriented to Self, Oriented to Place, Oriented to  Time, Oriented to Situation Alcohol / Substance Use: Not Applicable Psych Involvement: No (comment)  Admission diagnosis:  Cellulitis of right lower extremity [L03.115] ESRD on hemodialysis ([place]) [N18.6, Z99.2] Hypotension [I95.9] Severe sepsis ([place]) [A41.9, R65.20] Patient Active Problem List   Diagnosis Date Noted  . Paroxysmal atrial fibrillation (H[facility])   . Severe sepsis ([place]) 11/17/2019  . Cellulitis of right lower extremity 11/17/2019  . Lactic acidosis 11/17/2019  . Hypoglycemia 11/17/2019  . Anemia   . GERD (gastroesophageal reflux disease)   . ESRD (end stage renal disease) ([place])   . Murmur 03/25/2018  . Laboratory examination 03/25/2018  . Tendonitis of both rotator cuffs 12/29/2017  . Greater trochanteric bursitis of both hips 12/29/2017  . Diverticulitis of large intestine without perforation or abscess without bleeding   . Diverticulitis 07/01/2017  . 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  At risk for sepsis 09/08/2015  . GI bleed 12/09/2014   PCP:  Pcp, No Pharmacy:   [id number] [place], [address] [place] [address] Phone: [phone] Fax:  [phone]     Social Determinants of Health (SDOH) Interventions    Readmission Risk Interventions Readmission Risk Prevention Plan 11/24/2019  Transportation Screening Complete  PCP or Specialist Appt within 3-5 Days Complete  HRI or [place] Complete  Social Work Consult for [place] Planning/Counseling Complete  Palliative Care Screening Not Applicable  Medication Review Press photographer) Referral to Pharmacy  Some recent data might be hidden

## 2019-11-24 NOTE — Progress Notes (Signed)
Hemodialysis femoral vein catheter removed per MD order, Site look good, catheter intact. Pt tolerated the removal of the catheter well. Will continue to monitor for any sign of bleeding.

## 2019-11-24 NOTE — Progress Notes (Signed)
PROGRESS NOTE    [name]   ZOX:[id number]  DOB: Sep 11, 1970  PCP: Pcp, No    DOA: 11/17/2019 LOS: 7   Brief Narrative   [name] is a 49 y.o. male with medical history significant for end-stage renal disease on hemodialysis on Tuesday, Thursday, Saturday schedule, chronic prednisone therapy in the setting of previous kidney transplant, chronic anemia with baseline hemoglobin 8-9, diverticulitis, GERD, who is admitted on 11/17/2019 with right lower extremity cellulitis after presenting to the ED from home with worsening right lower extremity pain and swelling with purulent discharge, and associated fever/chills, rigors and general malaise.    While holding for a bed in the ED, patient became hypotensive requiring pressors, and ICU level of care for septic shock secondary to right lower extremity cellulitis.       TRH assumed care of patient on 11/23/19.  BP stable off pressors.  Had been receiving CRRT in ICU with plan for return to hemodialysis 11/3.  BP stable after dialysis, patient transferred out of ICU to progressive unit.     Assessment & Plan   Principal Problem:   Cellulitis of right lower extremity Active Problems:   Severe sepsis (HCC)   Lactic acidosis   Hypoglycemia   Anemia   GERD (gastroesophageal reflux disease)   ESRD (end stage renal disease) (HCC)   Paroxysmal atrial fibrillation (HCC)   Septic shock secondary to severe right lower extremity cellulitis - weaned off pressors, transferred out of ICU on 11/3 after hemodialysis and BP stable. Clinically improving, afebrile, leukocytosis improved.  Given severity of his infection with septic shock requiring multiple pressors, will continue IV antibiotics at least another 24 hours. --wound culture grew multiple organisms --On Ancef, continue --Tylenol PRN fevers --Analgesics PRN --has been stress dose steroids since in ICU, tapering down to home dose prednisone as below  Hypotension - on midodrine  per nephro  Acute metabolic encephalopathy - likely due to infection and renal failure, resolved.  ESRD - on hemodialysis MWF.  Nephrology following.  Removal femoral temporary catheter today.  Status post renal transplant - on chronic prednisone.  Has been on stress dose steroids since ICU.  Will taper down to usual dose of 10 mg daily, starting tomorrow with prednisone 40 mg.  Wide-complex tachycardia - Cardiology following, suspected due to sepsis.   Has not recurred in now >48 hours.  Was on amiodarone which was d/c'd due to bradycardia.  Seen by EP, agreed likely due to sepsis.   Patient has normal LV Systolic function --Telemetry monitoring --Maintain K>4.0 and Mg > 2.0  Paroxysmal A-fib - CHA2DS2-VASc score 0, no anticoagulation indicated and would be higher risk in setting of anemia and thrombocytopenia  Anemia of chronic renal disease - baseline Hbg 8-9.  On EPO.  Monitor CBC.  Hypoglycemic episode - resolved.  Required D10 infusion.  GERD     Patient BMI: Body mass index is 28.22 kg/m.   DVT prophylaxis: heparin injection 5,000 Units Start: 11/17/19 2200   Diet:  Diet Orders (From admission, onward)    Start     Ordered   11/17/19 1743  Diet renal/carb modified with fluid restriction Fluid restriction: 2000 mL Fluid; Room service appropriate? Yes; Fluid consistency: Thin  Diet effective now       Question Answer Comment  Fluid restriction: 2000 mL Fluid   Room service appropriate? Yes   Fluid consistency: Thin      11/17/19 1742  Code Status: Full Code    Subjective 11/24/19    Pt seen at bedside with RN present.  He reports feeling well.  Has not worked with therapy yet.  Currently having BM.  Reports 9 out of 10 right leg pain, medication given about 4 hours ago.  No fever chills or other acute complaints.   Disposition Plan & Communication   Status is: Inpatient  Inpatient status remains appropriate because of severity of illness,  continuing with IV antibiotics and therapy evaluations are pending for safe d/c planning.  Dispo: The patient is from: Home, lives alone              Anticipated d/c is to: home vs SNF pending PT eval              Anticipated d/c date is: 1-2 days              Patient currently is not medically stable for d/c.    Family Communication: none at bedside, will attempt to call    Consults, Procedures, Significant Events   Consultants:   Nephrology  Cardiology  PCCM  Procedures:   Dialysis, CRRT  Antimicrobials:  Anti-infectives (From admission, onward)   Start     Dose/Rate Route Frequency Ordered Stop   11/23/19 1800  ceFAZolin (ANCEF) IVPB 1 g/50 mL premix        1 g 100 mL/hr over 30 Minutes Intravenous Every 24 hours 11/23/19 1222 11/27/19 1759   11/22/19 2200  ceFAZolin (ANCEF) IVPB 2g/100 mL premix  Status:  Discontinued        2 g 200 mL/hr over 30 Minutes Intravenous Every 12 hours 11/22/19 1136 11/23/19 1222   11/19/19 2000  vancomycin (VANCOCIN) IVPB 1000 mg/200 mL premix  Status:  Discontinued        1,000 mg 200 mL/hr over 60 Minutes Intravenous Every 24 hours 11/19/19 1422 11/21/19 1107   11/19/19 1800  ceFEPIme (MAXIPIME) 2 g in sodium chloride 0.9 % 100 mL IVPB  Status:  Discontinued        2 g 200 mL/hr over 30 Minutes Intravenous Every 12 hours 11/19/19 1422 11/22/19 1135   11/19/19 1200  vancomycin (VANCOCIN) IVPB 1000 mg/200 mL premix  Status:  Discontinued        1,000 mg 200 mL/hr over 60 Minutes Intravenous Every T-Th-Sa (Hemodialysis) 11/17/19 1836 11/18/19 1444   11/18/19 2000  ceFEPIme (MAXIPIME) 2 g in sodium chloride 0.9 % 100 mL IVPB  Status:  Discontinued        2 g 200 mL/hr over 30 Minutes Intravenous Every 24 hours 11/18/19 0154 11/18/19 0858   11/18/19 2000  ceFEPIme (MAXIPIME) 1 g in sodium chloride 0.9 % 100 mL IVPB  Status:  Discontinued        1 g 200 mL/hr over 30 Minutes Intravenous Every 24 hours 11/18/19 0858 11/19/19 1422    11/18/19 1500  cefTRIAXone (ROCEPHIN) 1 g in sodium chloride 0.9 % 100 mL IVPB  Status:  Discontinued        1 g 200 mL/hr over 30 Minutes Intravenous Every 24 hours 11/17/19 1800 11/17/19 2207   11/18/19 1442  vancomycin variable dose per unstable renal function (pharmacist dosing)  Status:  Discontinued         Does not apply See admin instructions 11/18/19 1444 11/19/19 1422   11/17/19 2245  ceFEPIme (MAXIPIME) 2 g in sodium chloride 0.9 % 100 mL IVPB        2  g 200 mL/hr over 30 Minutes Intravenous  Once 11/17/19 2214 11/18/19 0156   11/17/19 2200  clindamycin (CLEOCIN) IVPB 600 mg  Status:  Discontinued        600 mg 100 mL/hr over 30 Minutes Intravenous Every 8 hours 11/17/19 2011 11/18/19 2013   11/17/19 2000  vancomycin (VANCOREADY) IVPB 750 mg/150 mL  Status:  Discontinued        750 mg 150 mL/hr over 60 Minutes Intravenous Every 24 hours 11/17/19 1824 11/17/19 1832   11/17/19 1545  piperacillin-tazobactam (ZOSYN) IVPB 3.375 g  Status:  Discontinued        3.375 g 100 mL/hr over 30 Minutes Intravenous  Once 11/17/19 1535 11/17/19 1544   11/17/19 1545  clindamycin (CLEOCIN) IVPB 900 mg  Status:  Discontinued        900 mg 100 mL/hr over 30 Minutes Intravenous  Once 11/17/19 1535 11/17/19 1544   11/17/19 1500  vancomycin (VANCOREADY) IVPB 1250 mg/250 mL  Status:  Discontinued        1,250 mg 166.7 mL/hr over 90 Minutes Intravenous  Once 11/17/19 1410 11/17/19 1411   11/17/19 1415  vancomycin (VANCOCIN) IVPB 1000 mg/200 mL premix        1,000 mg 200 mL/hr over 60 Minutes Intravenous  Once 11/17/19 1401 11/17/19 1825   11/17/19 1415  cefTRIAXone (ROCEPHIN) 2 g in sodium chloride 0.9 % 100 mL IVPB        2 g 200 mL/hr over 30 Minutes Intravenous  Once 11/17/19 1401 11/17/19 1558         Objective   Vitals:   11/24/19 0416 11/24/19 0418 11/24/19 0823 11/24/19 1120  BP:   124/65 (!) 109/51  Pulse: 65  60 (!) 50  Resp:   20 16  Temp:   98.2 F (36.8 C) 98.5 F (36.9 C)   TempSrc:   Oral Oral  SpO2: 100%  97% 100%  Weight:  99.7 kg    Height:        Intake/Output Summary (Last 24 hours) at 11/24/2019 1259 Last data filed at 11/24/2019 0300 Gross per 24 hour  Intake 222.46 ml  Output 1000 ml  Net -777.54 ml   Filed Weights   11/23/19 0500 11/23/19 1844 11/24/19 0418  Weight: 100 kg 98.7 kg 99.7 kg    Physical Exam:  General exam: awake, alert, no acute distress, conversational Respiratory system: CTAB, normal respiratory effort, no wheezes or rhonchi. Cardiovascular system: normal S1/S2, RRR, trace to 1+ LE edema.   Gastrointestinal system: soft, NT, ND Central nervous system: A&O x3. no gross focal neurologic deficits, normal speech Extremities: right lower extremity dressing in place, trace to 1+ edema, moves all   Labs   Data Reviewed: I have personally reviewed following labs and imaging studies  CBC: Recent Labs  Lab 11/19/19 0600 11/19/19 0600 11/20/19 0509 11/21/19 0330 11/22/19 0522 11/23/19 0416 11/24/19 0545  WBC 20.2*   < > 15.5* 17.0* 12.8* 13.8* 15.9*  NEUTROABS 18.6*  --  14.1* 15.2* 10.8* 11.1*  --   HGB 8.4*   < > 8.4* 8.4* 8.3* 8.0* 7.9*  HCT 26.1*   < > 25.3* 25.5* 24.9* 25.0* 24.9*  MCV 91.6   < > 87.5 88.5 87.4 91.9 90.9  PLT 102*   < > 114* 131* 104* 116* 133*   < > = values in this interval not displayed.   Basic Metabolic Panel: Recent Labs  Lab 11/20/19 0509 11/20/19 1610 11/21/19 0330 11/21/19 0330 11/21/19  1709 11/22/19 0522 11/22/19 1628 11/23/19 0416 11/24/19 0545  NA 133*  134*   < > 133*  132*   < > 132* 133*  133* 135 133*  134* 135  137  K 4.0  4.0   < > 3.8  3.7   < > 3.6 3.6  3.6 3.9 3.9  3.9 3.5  3.5  CL 98  98   < > 98  97*   < > 99 99  98 99 99  98 97*  99  CO2 24  23   < > 25  25   < > 23 23  24 24 25  23 28  28   GLUCOSE 112*  108*   < > 112*  112*   < > 128* 118*  119* 125* 107*  109* 107*  105*  BUN 25*  24*   < > 28*  28*   < > 30* 24*  24* 21* 26*   26* 24*  25*  CREATININE 3.28*  3.30*   < > 2.97*  3.05*   < > 2.74* 2.17*  2.26* 1.81* 2.35*  2.24* 2.52*  2.45*  CALCIUM 9.9  9.9   < > 10.4*  10.3   < > 9.9 9.9  9.9 9.6 9.9  10.1 9.6  9.8  MG 2.1  --  2.2  --   --  2.6*  --  2.5* 2.2  PHOS 3.4  3.6   < > 3.1  3.2   < > 2.3* 1.7*  1.7* 1.6* 2.4*  2.4* 2.8   < > = values in this interval not displayed.   GFR: Estimated Creatinine Clearance: 44.2 mL/min (A) (by C-G formula based on SCr of 2.52 mg/dL (H)). Liver Function Tests: Recent Labs  Lab 11/17/19 1405 11/17/19 1405 11/18/19 0500 11/19/19 1645 11/21/19 1709 11/22/19 0522 11/22/19 1628 11/23/19 0416 11/24/19 0545  AST 26  --  17  --   --   --   --   --   --   ALT 8  --  10  --   --   --   --   --   --   ALKPHOS 129*  --  117  --   --   --   --   --   --   BILITOT 1.6*  --  2.1*  --   --   --   --   --   --   PROT 7.2  --  6.1*  --   --   --   --   --   --   ALBUMIN 3.4*   < > 2.8*   < > 2.6* 2.8* 2.6* 2.6* 2.5*   < > = values in this interval not displayed.   No results for input(s): LIPASE, AMYLASE in the last 168 hours. No results for input(s): AMMONIA in the last 168 hours. Coagulation Profile: Recent Labs  Lab 11/17/19 1405 11/18/19 0500  INR 1.2 1.8*   Cardiac Enzymes: Recent Labs  Lab 11/17/19 1405  CKTOTAL 136   BNP (last 3 results) No results for input(s): PROBNP in the last 8760 hours. HbA1C: No results for input(s): HGBA1C in the last 72 hours. CBG: Recent Labs  Lab 11/19/19 1540 11/19/19 2000 11/20/19 0004 11/20/19 0354 11/20/19 0727  GLUCAP 84 111* 108* 119* 109*   Lipid Profile: No results for input(s): CHOL, HDL, LDLCALC, TRIG, CHOLHDL, LDLDIRECT in the last 72 hours. Thyroid Function Tests: No  results for input(s): TSH, T4TOTAL, FREET4, T3FREE, THYROIDAB in the last 72 hours. Anemia Panel: No results for input(s): VITAMINB12, FOLATE, FERRITIN, TIBC, IRON, RETICCTPCT in the last 72 hours. Sepsis Labs: Recent Labs  Lab  11/17/19 1405 11/17/19 2000 11/18/19 0103 11/18/19 0500 11/18/19 2030 11/19/19 0600  PROCALCITON  --   --  30.72 39.28  --  50.67  LATICACIDVEN 3.2*  3.3* 3.6* 2.6*  --  1.6  --     Recent Results (from the past 240 hour(s))  Wound or Superficial Culture     Status: Abnormal   Collection Time: 11/17/19  2:03 PM   Specimen: Wound  Result Value Ref Range Status   Specimen Description   Final    WOUND Performed at [facility], [address], [place], [place] [address]    Special Requests   Final    RL Performed at [facility], [address] [place], [place] [address]    Gram Stain   Final    NO WBC SEEN ABUNDANT GRAM NEGATIVE RODS FEW GRAM POSITIVE COCCI Performed at [facility], [place] [address] [place], [place] [address]    Culture MULTIPLE ORGANISMS PRESENT, NONE PREDOMINANT (A)  Final   Report Status 11/20/2019 FINAL  Final  Respiratory Panel by RT PCR (Flu A&B, Covid) - Nasopharyngeal Swab     Status: None   Collection Time: 11/17/19  2:05 PM   Specimen: Nasopharyngeal Swab  Result Value Ref Range Status   SARS Coronavirus 2 by RT PCR NEGATIVE NEGATIVE Final    Comment: (NOTE) SARS-CoV-2 target nucleic acids are NOT DETECTED.  The SARS-CoV-2 RNA is generally detectable in upper respiratoy specimens during the acute phase of infection. The lowest concentration of SARS-CoV-2 viral copies this assay can detect is 131 copies/mL. A negative result does not preclude SARS-Cov-2 infection and should not be used as the sole basis for treatment or other patient management decisions. A negative result may occur with  improper specimen collection/handling, submission of specimen other than nasopharyngeal swab, presence of viral mutation(s) within the areas targeted by this assay, and inadequate number of viral copies (<131 copies/mL). A negative result must be combined with clinical observations, patient history, and epidemiological information.  The expected result is Negative.  Fact Sheet for Patients:  PinkCheek.be  Fact Sheet for Healthcare Providers:  GravelBags.it  This test is no t yet approved or cleared by the [place] FDA and  has been authorized for detection and/or diagnosis of SARS-CoV-2 by FDA under an Emergency Use Authorization (EUA). This EUA will remain  in effect (meaning this test can be used) for the duration of the COVID-19 declaration under Section 564(b)(1) of the Act, 21 U.S.C. section 360bbb-3(b)(1), unless the authorization is terminated or revoked sooner.     Influenza A by PCR NEGATIVE NEGATIVE Final   Influenza B by PCR NEGATIVE NEGATIVE Final    Comment: (NOTE) The Xpert Xpress SARS-CoV-2/FLU/RSV assay is intended as an aid in  the diagnosis of influenza from Nasopharyngeal swab specimens and  should not be used as a sole basis for treatment. Nasal washings and  aspirates are unacceptable for Xpert Xpress SARS-CoV-2/FLU/RSV  testing.  Fact Sheet for Patients: PinkCheek.be  Fact Sheet for Healthcare Providers: GravelBags.it  This test is not yet approved or cleared by the [place] FDA and  has been authorized for detection and/or diagnosis of SARS-CoV-2 by  FDA under an Emergency Use Authorization (EUA). This EUA will remain  in effect (  meaning this test can be used) for the duration of the  Covid-19 declaration under Section 564(b)(1) of the Act, 21  U.S.C. section 360bbb-3(b)(1), unless the authorization is  terminated or revoked. Performed at [facility], [address] [place], [place] [address]   Blood Culture (routine x 2)     Status: None   Collection Time: 11/17/19  2:06 PM   Specimen: BLOOD  Result Value Ref Range Status   Specimen Description BLOOD BLOOD RIGHT HAND  Final   Special Requests   Final    BOTTLES DRAWN AEROBIC AND ANAEROBIC  Blood Culture results may not be optimal due to an inadequate volume of blood received in culture bottles   Culture   Final    NO GROWTH 5 DAYS Performed at [facility], [address] [place], [place] [address]    Report Status 11/22/2019 FINAL  Final  Blood Culture (routine x 2)     Status: None   Collection Time: 11/17/19  2:06 PM   Specimen: BLOOD  Result Value Ref Range Status   Specimen Description BLOOD BLOOD RIGHT HAND  Final   Special Requests   Final    BOTTLES DRAWN AEROBIC AND ANAEROBIC Blood Culture adequate volume   Culture   Final    NO GROWTH 5 DAYS Performed at [facility], [address], [place], [place] [address]    Report Status 11/22/2019 FINAL  Final  MRSA PCR Screening     Status: Abnormal   Collection Time: 11/18/19  1:03 AM   Specimen: Nasopharyngeal  Result Value Ref Range Status   MRSA by PCR POSITIVE (A) NEGATIVE Corrected    Comment:        The GeneXpert MRSA Assay (FDA approved for NASAL specimens only), is one component of a comprehensive MRSA colonization surveillance program. It is not intended to diagnose MRSA infection nor to guide or monitor treatment for MRSA infections. RESULT CALLED TO, READ BACK BY AND VERIFIED WITH: CYNTHIA VASQUEZ @0245  ON 11/18/19 SKL Performed at [facility], [address] [place],  [address] CORRECTED ON 10/29 AT 0249: PREVIOUSLY REPORTED AS POSITIVE        The GeneXpert MRSA Assay (FDA approved for NASAL specimens only), is one component of a comprehensive MRSA colonization surveillance program. It is not intended to diagnose MRSA infection  nor to guide or monitor treatment for MRSA infections. CYNTHIA VASQUEZ @0245  ON 11/18/19 SKL       Imaging Studies   No results found.   Medications   Scheduled Meds: . aspirin EC  81 mg Oral Daily  . Chlorhexidine Gluconate Cloth  6 each Topical Daily  . collagenase   Topical Daily  . [START ON 11/25/2019] epoetin  (EPOGEN/PROCRIT) injection  10,000 Units Intravenous Q M,W,F-HD  . feeding supplement  237 mL Oral TID BM  . ferrous sulfate  325 mg Oral BID  . heparin  5,000 Units Subcutaneous Q8H  . hydrocortisone sod succinate (SOLU-CORTEF) inj  50 mg Intravenous Q6H  . mouth rinse  15 mL Mouth Rinse BID  . midodrine  20 mg Oral TID  . pantoprazole (PROTONIX) IV  40 mg Intravenous Q12H  . sodium chloride flush  3 mL Intravenous Q12H   Continuous Infusions: . sodium chloride Stopped (11/23/19 2033)  . sodium chloride Stopped (11/23/19 1105)  .  ceFAZolin (ANCEF) IV Stopped (11/23/19 2033)       LOS: 7 days    Time spent: 25 minutes with >  50% spent in coordination of care and direct patient contact.    Ezekiel Slocumb, DO [facility]  11/24/2019, 12:59 PM    If 7PM-7AM, please contact night-coverage. How to contact the [facility] Attending or Consulting provider [place] or covering provider during after hours [place], for this patient?    1. Check the care team in [facility] and look for a) attending/consulting [facility] provider listed and b) the [facility] team listed 2. Log into [email or web address] and use [facility] universal password to access. If you do not have the password, please contact the hospital operator. 3. Locate the [facility] provider you are looking for under [facility] and page to a number that you can be directly reached. 4. If you still have difficulty reaching the provider, please page the [facility] (Director on Call) for the Hospitalists listed on amion for assistance.

## 2019-11-24 NOTE — Evaluation (Signed)
Occupational Therapy Evaluation Patient Details Name: [name] MRN: [id number] DOB: 11-Apr-1970 Today's Date: 11/24/2019    History of Present Illness presented to ER secondary to R LE swelling, pain; admitted for management of severe cellulitis of R LE.  Hospital course additionally significant for hypotension, requiring transfer to CCU and pressor support; episode of vtach with synchronized cardioversion (10/30), CRRT (via temp dialysis cath, left) 10/30-11/2.  L temp fem cath discontinued and patient cleared for participation with session 11/24/19.   Clinical Impression   Patient presenting with decreased I in self care,balance, functional mobility/transfers, endurance, strength, and safety awareness.  Patient may be poor historian but reports being independent PTA without use of AD. He also reports driving himself to dialysis on MWF. Patient currently needing mod +2 assistance for standing and manual facilitation for upright positioning. Pt appears to have B knees in hyperextension but was able to take a few side steps with assistance. Pt with uncontrolled sit back on bed and fatigues quickly. Pt performed grooming tasks with set up A on EOB with min cuing for sequencing of tasks. Pt is externally distracted by environment and having visual hallucinations and believing son to be present in room. Pt also reporting being in [place] vs [place]. Pt verbalized urgent need for BM and rolling onto bedpan in bed and then able to perform hygiene with min A for thoroughness.  Patient will benefit from acute OT to increase overall independence in the areas of ADLs, functional mobility, and safety awareness in order to safely discharge to next venue of care to address functional deficits.    Follow Up Recommendations  CIR;Supervision/Assistance - 24 hour    Equipment Recommendations  Other (comment) (defer to next venue of care)       Precautions / Restrictions Precautions Precautions:  Fall Precaution Comments: L AVF, R LE wound (ace bandaged) Restrictions Weight Bearing Restrictions: No      Mobility Bed Mobility Overal bed mobility: Needs Assistance Bed Mobility: Sit to Supine     Supine to sit: Mod assist Sit to supine: Mod assist   General bed mobility comments: sit >supine with mod A for B LEs    Transfers Overall transfer level: Needs assistance Equipment used: Rolling walker (2 wheeled) Transfers: Sit to/from Stand Sit to Stand: Mod assist;+2 physical assistance         General transfer comment: from elevated bed with heavy reliance on UE to stand. Pt needing manual facilitation for upright position and bilateral knees hyperextended in standing.    Balance Overall balance assessment: Needs assistance Sitting-balance support: No upper extremity supported;Feet supported Sitting balance-Leahy Scale: Fair     Standing balance support: Bilateral upper extremity supported Standing balance-Leahy Scale: Poor      ADL either performed or assessed with clinical judgement   ADL Overall ADL's : Needs assistance/impaired     Grooming: Wash/dry hands;Wash/dry face;Oral care;Set up;Sitting Grooming Details (indicate cue type and reason): on EOB with close supervision and set up A to obtain all needed items     Functional mobility during ADLs: +2 for safety/equipment;+2 for physical assistance;Maximal assistance;Moderate assistance;Rolling walker General ADL Comments: Pt sitting on EOB to wash face and brush teeth with set up A and supervision for balance. Pt needing min cuing for sequencing of tasks. Pt reports need for BM and returns to supine with bed pan placed. Pt demonstrated ability to wipe self with set up A for cloths.     Vision Baseline Vision/History: Wears glasses Wears Glasses: At  all times Patient Visual Report: No change from baseline              Pertinent Vitals/Pain Pain Assessment: Faces Faces Pain Scale: Hurts even  more Pain Location: R LE Pain Descriptors / Indicators: Aching;Grimacing;Moaning Pain Intervention(s): Limited activity within patient's tolerance;Monitored during session;Repositioned     Hand Dominance Left   Extremity/Trunk Assessment Upper Extremity Assessment Upper Extremity Assessment: Generalized weakness   Lower Extremity Assessment Lower Extremity Assessment: Generalized weakness       Communication Communication Communication: No difficulties   Cognition Arousal/Alertness: Awake/alert Behavior During Therapy: WFL for tasks assessed/performed Overall Cognitive Status: Impaired/Different from baseline      General Comments: oriented to self and year; believes location to be [place], month september.  Very easily distracted by external environment, difficulty sustaining focus of attention.  Suspect some degree of visual hallucination-patient talking to son believed to be in room as therapist entered room (son not present at facility, confirmed with nursing)      Exercises Other Exercises Other Exercises: Sit/stand x2 with RW, mod/max assist +2 from elevated bed surface Other Exercises: Lateral scooting edge of bed, close sup; very heavy use of bilat UEs to initiate/complete lift off and lateral movement. May be appropriate for scoot pivot transfers in subsequent sessions. Other Exercises: Orthostatic assessment (see vitals flowsheet for details); vitals stable and WFL with transition to upright.  No subjective/objective reports of orthostasis. Other Exercises: See OT evaluation/note for additional details of ADL activities.        Home Living Family/patient expects to be discharged to:: Private residence Living Arrangements: Alone   Type of Home: House Home Access: [place]: One [place]: None   Additional Comments: Patient questionable historian; will verify with family as available      Prior  Functioning/Environment Level of Independence: Independent        Comments: Per patient report, indep with ADLs, household and community mobilization without assist device; drives to/from dialysis MWF.  Patient questionable historian; will verify with family as available        OT Problem List: Decreased strength;Decreased coordination;Decreased safety awareness;Decreased activity tolerance;Decreased knowledge of use of DME or AE;Impaired balance (sitting and/or standing);Decreased knowledge of precautions;Pain      OT Treatment/Interventions: Self-care/ADL training;Therapeutic exercise;Therapeutic activities;Energy conservation;Visual/perceptual remediation/compensation;DME and/or AE instruction;Patient/family education;Balance training    OT Goals(Current goals can be found in the care plan section) Acute Rehab OT Goals Patient Stated Goal: to get better OT Goal Formulation: With patient Time For Goal Achievement: 12/08/19 Potential to Achieve Goals: Good ADL Goals Pt Will Perform Grooming: with modified independence;sitting Pt Will Perform Lower Body Dressing: with min assist;sit to/from stand Pt Will Transfer to Toilet: with min assist;bedside commode Pt/caregiver will Perform Home Exercise Program: Increased strength;Both right and left upper extremity;With written HEP provided;With theraband;With Supervision  OT Frequency: Min 2X/week   Barriers to D/C: Decreased caregiver support          Co-evaluation PT/OT/SLP Co-Evaluation/Treatment: Yes Reason for Co-Treatment: Complexity of the patient's impairments (multi-system involvement);Necessary to address cognition/behavior during functional activity;For patient/therapist safety;To address functional/ADL transfers PT goals addressed during session: Mobility/safety with mobility;Balance OT goals addressed during session: ADL's and self-care      AM-PAC OT "6 Clicks" Daily Activity     Outcome Measure Help from another person  eating meals?: None Help from another person taking care of personal grooming?: A Little Help from another person toileting,  which includes using toliet, bedpan, or urinal?: Total Help from another person bathing (including washing, rinsing, drying)?: Total Help from another person to put on and taking off regular upper body clothing?: A Little Help from another person to put on and taking off regular lower body clothing?: Total 6 Click Score: 13   End of Session Equipment Utilized During Treatment: Rolling walker Nurse Communication: Mobility status;Precautions  Activity Tolerance: Patient tolerated treatment well Patient left: in bed;with call bell/phone within reach;with bed alarm set  OT Visit Diagnosis: Unsteadiness on feet (R26.81);Muscle weakness (generalized) (M62.81)                Time: 3299-2426 OT Time Calculation (min): 35 min Charges:  OT General Charges $OT Visit: 1 Visit OT Evaluation $OT Eval Moderate Complexity: 1 Mod OT Treatments $Self Care/Home Management : 8-22 mins

## 2019-11-24 NOTE — Progress Notes (Signed)
[facility]  ROUNDING NOTE   Subjective:   Hemodialysis treatment yesterday. Tolerated treatment well. UF of 1 liters.   Remains confused this morning.   Objective:  Vital signs in last 24 hours:  Temp:  [97.8 F (36.6 C)-99 F (37.2 C)] 98.5 F (36.9 C) (11/04 1120) Pulse Rate:  [50-101] 50 (11/04 1120) Resp:  [11-25] 16 (11/04 1120) BP: (105-134)/(46-65) 109/51 (11/04 1120) SpO2:  [74 %-100 %] 100 % (11/04 1120) Weight:  [98.7 kg-99.7 kg] 99.7 kg (11/04 0418)  Weight change: -1.252 kg Filed Weights   11/23/19 0500 11/23/19 1844 11/24/19 0418  Weight: 100 kg 98.7 kg 99.7 kg    Intake/Output: I/O last 3 completed shifts: In: 734.5 [P.O.:190; I.V.:395.9; IV Piggyback:148.6] Out: 1000 [Other:1000]   Intake/Output this shift:  No intake/output data recorded.  Physical Exam: General: Laying in bed  Head: Normocephalic, atraumatic. Moist oral mucosal membranes  Eyes: Anicteric, PERRL  Neck: Supple, trachea midline  Lungs:  Clear to auscultation  Heart: Irregular , bradycardia  Abdomen:  Soft, nontender  Extremities:  + peripheral edema. Right lower extremity induration  Neurologic: Alert to self and place  Skin: +induration  Access: Left AVF, left temp HD catheter ICU 36/62    Basic Metabolic Panel: Recent Labs  Lab 11/20/19 0509 11/20/19 1610 11/21/19 0330 11/21/19 0330 11/21/19 1709 11/21/19 1709 11/22/19 0522 11/22/19 0522 11/22/19 1628 11/23/19 0416 11/24/19 0545  NA 133*  134*   < > 133*  132*   < > 132*  --  133*  133*  --  135 133*  134* 135  137  K 4.0  4.0   < > 3.8  3.7   < > 3.6  --  3.6  3.6  --  3.9 3.9  3.9 3.5  3.5  CL 98  98   < > 98  97*   < > 99  --  99  98  --  99 99  98 97*  99  CO2 24  23   < > 25  25   < > 23  --  23  24  --  24 25  23 28  28   GLUCOSE 112*  108*   < > 112*  112*   < > 128*  --  118*  119*  --  125* 107*  109* 107*  105*  BUN 25*  24*   < > 28*  28*   < > 30*  --  24*  24*   --  21* 26*  26* 24*  25*  CREATININE 3.28*  3.30*   < > 2.97*  3.05*   < > 2.74*  --  2.17*  2.26*  --  1.81* 2.35*  2.24* 2.52*  2.45*  CALCIUM 9.9  9.9   < > 10.4*  10.3   < > 9.9   < > 9.9  9.9   < > 9.6 9.9  10.1 9.6  9.8  MG 2.1  --  2.2  --   --   --  2.6*  --   --  2.5* 2.2  PHOS 3.4  3.6   < > 3.1  3.2   < > 2.3*  --  1.7*  1.7*  --  1.6* 2.4*  2.4* 2.8   < > = values in this interval not displayed.    Liver Function Tests: Recent Labs  Lab 11/17/19 1405 11/17/19 1405 11/18/19 0500 11/19/19 1645 11/21/19 1709 11/22/19 0522 11/22/19 1628  11/23/19 0416 11/24/19 0545  AST 26  --  17  --   --   --   --   --   --   ALT 8  --  10  --   --   --   --   --   --   ALKPHOS 129*  --  117  --   --   --   --   --   --   BILITOT 1.6*  --  2.1*  --   --   --   --   --   --   PROT 7.2  --  6.1*  --   --   --   --   --   --   ALBUMIN 3.4*   < > 2.8*   < > 2.6* 2.8* 2.6* 2.6* 2.5*   < > = values in this interval not displayed.   No results for input(s): LIPASE, AMYLASE in the last 168 hours. No results for input(s): AMMONIA in the last 168 hours.  CBC: Recent Labs  Lab 11/19/19 0600 11/19/19 0600 11/20/19 0509 11/21/19 0330 11/22/19 0522 11/23/19 0416 11/24/19 0545  WBC 20.2*   < > 15.5* 17.0* 12.8* 13.8* 15.9*  NEUTROABS 18.6*  --  14.1* 15.2* 10.8* 11.1*  --   HGB 8.4*   < > 8.4* 8.4* 8.3* 8.0* 7.9*  HCT 26.1*   < > 25.3* 25.5* 24.9* 25.0* 24.9*  MCV 91.6   < > 87.5 88.5 87.4 91.9 90.9  PLT 102*   < > 114* 131* 104* 116* 133*   < > = values in this interval not displayed.    Cardiac Enzymes: Recent Labs  Lab 11/17/19 1405  CKTOTAL 136    BNP: Invalid input(s): POCBNP  CBG: Recent Labs  Lab 11/19/19 1540 11/19/19 2000 11/20/19 0004 11/20/19 0354 11/20/19 0727  GLUCAP 3 111* 108* 119* 109*    Microbiology: Results for orders placed or performed during the hospital encounter of 11/17/19  Wound or Superficial Culture     Status: Abnormal    Collection Time: 11/17/19  2:03 PM   Specimen: Wound  Result Value Ref Range Status   Specimen Description   Final    WOUND Performed at [facility], [address], [place], [place] [address]    Special Requests   Final    RL Performed at [facility], [address] [place], [place] [address]    Gram Stain   Final    NO WBC SEEN ABUNDANT GRAM NEGATIVE RODS FEW GRAM POSITIVE COCCI Performed at [facility], [place] [address] [place], [place] [address]    Culture MULTIPLE ORGANISMS PRESENT, NONE PREDOMINANT (A)  Final   Report Status 11/20/2019 FINAL  Final  Respiratory Panel by RT PCR (Flu A&B, Covid) - Nasopharyngeal Swab     Status: None   Collection Time: 11/17/19  2:05 PM   Specimen: Nasopharyngeal Swab  Result Value Ref Range Status   SARS Coronavirus 2 by RT PCR NEGATIVE NEGATIVE Final    Comment: (NOTE) SARS-CoV-2 target nucleic acids are NOT DETECTED.  The SARS-CoV-2 RNA is generally detectable in upper respiratoy specimens during the acute phase of infection. The lowest concentration of SARS-CoV-2 viral copies this assay can detect is 131 copies/mL. A negative result does not preclude SARS-Cov-2 infection and should not be used as the sole basis for treatment or other patient management decisions. A negative result may occur with  improper specimen  collection/handling, submission of specimen other than nasopharyngeal swab, presence of viral mutation(s) within the areas targeted by this assay, and inadequate number of viral copies (<131 copies/mL). A negative result must be combined with clinical observations, patient history, and epidemiological information. The expected result is Negative.  Fact Sheet for Patients:  PinkCheek.be  Fact Sheet for Healthcare Providers:  GravelBags.it  This test is no t yet approved or cleared by the [place] FDA and  has been authorized  for detection and/or diagnosis of SARS-CoV-2 by FDA under an Emergency Use Authorization (EUA). This EUA will remain  in effect (meaning this test can be used) for the duration of the COVID-19 declaration under Section 564(b)(1) of the Act, 21 U.S.C. section 360bbb-3(b)(1), unless the authorization is terminated or revoked sooner.     Influenza A by PCR NEGATIVE NEGATIVE Final   Influenza B by PCR NEGATIVE NEGATIVE Final    Comment: (NOTE) The Xpert Xpress SARS-CoV-2/FLU/RSV assay is intended as an aid in  the diagnosis of influenza from Nasopharyngeal swab specimens and  should not be used as a sole basis for treatment. Nasal washings and  aspirates are unacceptable for Xpert Xpress SARS-CoV-2/FLU/RSV  testing.  Fact Sheet for Patients: PinkCheek.be  Fact Sheet for Healthcare Providers: GravelBags.it  This test is not yet approved or cleared by the [place] FDA and  has been authorized for detection and/or diagnosis of SARS-CoV-2 by  FDA under an Emergency Use Authorization (EUA). This EUA will remain  in effect (meaning this test can be used) for the duration of the  Covid-19 declaration under Section 564(b)(1) of the Act, 21  U.S.C. section 360bbb-3(b)(1), unless the authorization is  terminated or revoked. Performed at [facility], [address] [place], [place] [address]   Blood Culture (routine x 2)     Status: None   Collection Time: 11/17/19  2:06 PM   Specimen: BLOOD  Result Value Ref Range Status   Specimen Description BLOOD BLOOD RIGHT HAND  Final   Special Requests   Final    BOTTLES DRAWN AEROBIC AND ANAEROBIC Blood Culture results may not be optimal due to an inadequate volume of blood received in culture bottles   Culture   Final    NO GROWTH 5 DAYS Performed at [facility], [address] [place], [place] [address]    Report Status 11/22/2019 FINAL  Final  Blood Culture  (routine x 2)     Status: None   Collection Time: 11/17/19  2:06 PM   Specimen: BLOOD  Result Value Ref Range Status   Specimen Description BLOOD BLOOD RIGHT HAND  Final   Special Requests   Final    BOTTLES DRAWN AEROBIC AND ANAEROBIC Blood Culture adequate volume   Culture   Final    NO GROWTH 5 DAYS Performed at [facility], [address], [place], [place] [address]    Report Status 11/22/2019 FINAL  Final  MRSA PCR Screening     Status: Abnormal   Collection Time: 11/18/19  1:03 AM   Specimen: Nasopharyngeal  Result Value Ref Range Status   MRSA by PCR POSITIVE (A) NEGATIVE Corrected    Comment:        The GeneXpert MRSA Assay (FDA approved for NASAL specimens only), is one component of a comprehensive MRSA colonization surveillance program. It is not intended to diagnose MRSA infection nor to guide or monitor treatment for MRSA infections. RESULT CALLED TO, READ BACK BY AND VERIFIED WITH: CYNTHIA  VASQUEZ @0245  ON 11/18/19 SKL Performed at [facility], [address] [place],  [address] CORRECTED ON 10/29 AT 0249: PREVIOUSLY REPORTED AS POSITIVE        The GeneXpert MRSA Assay (FDA approved for NASAL specimens only), is one component of a comprehensive MRSA colonization surveillance program. It is not intended to diagnose MRSA infection  nor to guide or monitor treatment for MRSA infections. CYNTHIA VASQUEZ @0245  ON 11/18/19 SKL     Coagulation Studies: No results for input(s): LABPROT, INR in the last 72 hours.  Urinalysis: No results for input(s): COLORURINE, LABSPEC, PHURINE, GLUCOSEU, HGBUR, BILIRUBINUR, KETONESUR, PROTEINUR, UROBILINOGEN, NITRITE, LEUKOCYTESUR in the last 72 hours.  Invalid input(s): APPERANCEUR    Imaging: No results found.   Medications:   . sodium chloride Stopped (11/23/19 2033)  . sodium chloride Stopped (11/23/19 1105)  .  ceFAZolin (ANCEF) IV Stopped (11/23/19 2033)   . aspirin EC  81 mg Oral Daily   . Chlorhexidine Gluconate Cloth  6 each Topical Daily  . collagenase   Topical Daily  . [START ON 11/25/2019] epoetin (EPOGEN/PROCRIT) injection  10,000 Units Intravenous Q M,W,F-HD  . feeding supplement  237 mL Oral TID BM  . ferrous sulfate  325 mg Oral BID  . heparin  5,000 Units Subcutaneous Q8H  . hydrocortisone sod succinate (SOLU-CORTEF) inj  50 mg Intravenous Q6H  . mouth rinse  15 mL Mouth Rinse BID  . midodrine  20 mg Oral TID  . pantoprazole (PROTONIX) IV  40 mg Intravenous Q12H  . sodium chloride flush  3 mL Intravenous Q12H   sodium chloride, acetaminophen **OR** acetaminophen, atropine, dextrose, heparin, heparin sodium (porcine), HYDROcodone-acetaminophen, HYDROmorphone (DILAUDID) injection, melatonin, sodium chloride, sodium chloride flush, traZODone  Assessment/ Plan:  [name] is a 49 y.o. black male with end stage renal disease on hemodialysis, history of kidney transplant, lupus nephritis, and hypotension who was admitted to [facility] on 11/17/2019 for Cellulitis of right lower extremity [O37.858] ESRD on hemodialysis ([place]) [N18.6, Z99.2] Hypotension [I95.9] Severe sepsis ([place]) [A41.9, R65.20]  [facility] MWF [facility] Left AVF 93kg   1. End stage renal disease:   CRRT from 10/30-10/31, Then 11/1-11/2 - Resume MWF schedule. Challenge since patient is volume overloaded.  - Remove temp HD catheter  2. Sepsis with hypotension with right lower extremity cellulitis and leukocytosis. Blood cultures with no growth.   - broad spectrum antibiotics: cefazolin - Continue midodrine.  - tapering steroids as per hospitalist.   3. Anemia with chronic kidney disease: with thrombocytopenia. Hemoglobin 7.9 - EPO with HD treatment.   4. Secondary Hyperparathyroidism: not currently on binders.    LOS: 7 Kaya Klausing 11/4/202111:22 AM

## 2019-11-25 ENCOUNTER — Telehealth: Payer: Self-pay

## 2019-11-25 DIAGNOSIS — I495 Sick sinus syndrome: Secondary | ICD-10-CM

## 2019-11-25 DIAGNOSIS — L03115 Cellulitis of right lower limb: Secondary | ICD-10-CM | POA: Diagnosis not present

## 2019-11-25 DIAGNOSIS — I480 Paroxysmal atrial fibrillation: Secondary | ICD-10-CM | POA: Diagnosis not present

## 2019-11-25 DIAGNOSIS — N186 End stage renal disease: Secondary | ICD-10-CM | POA: Diagnosis not present

## 2019-11-25 DIAGNOSIS — R600 Localized edema: Secondary | ICD-10-CM | POA: Diagnosis not present

## 2019-11-25 LAB — CBC
HCT: 27.6 % — ABNORMAL LOW (ref 39.0–52.0)
Hemoglobin: 9 g/dL — ABNORMAL LOW (ref 13.0–17.0)
MCH: 29.6 pg (ref 26.0–34.0)
MCHC: 32.6 g/dL (ref 30.0–36.0)
MCV: 90.8 fL (ref 80.0–100.0)
Platelets: 165 10*3/uL (ref 150–400)
RBC: 3.04 MIL/uL — ABNORMAL LOW (ref 4.22–5.81)
RDW: 18.1 % — ABNORMAL HIGH (ref 11.5–15.5)
WBC: 16.3 10*3/uL — ABNORMAL HIGH (ref 4.0–10.5)
nRBC: 1.6 % — ABNORMAL HIGH (ref 0.0–0.2)

## 2019-11-25 LAB — RENAL FUNCTION PANEL
Albumin: 2.8 g/dL — ABNORMAL LOW (ref 3.5–5.0)
Anion gap: 11 (ref 5–15)
BUN: 34 mg/dL — ABNORMAL HIGH (ref 6–20)
CO2: 25 mmol/L (ref 22–32)
Calcium: 9.9 mg/dL (ref 8.9–10.3)
Chloride: 98 mmol/L (ref 98–111)
Creatinine, Ser: 3.38 mg/dL — ABNORMAL HIGH (ref 0.61–1.24)
GFR, Estimated: 21 mL/min — ABNORMAL LOW (ref >60)
Glucose, Bld: 110 mg/dL — ABNORMAL HIGH (ref 70–99)
Phosphorus: 3.7 mg/dL (ref 2.5–4.6)
Potassium: 3.7 mmol/L (ref 3.5–5.1)
Sodium: 134 mmol/L — ABNORMAL LOW (ref 135–145)

## 2019-11-25 LAB — POTASSIUM: Potassium: 3.7 mmol/L (ref 3.5–5.1)

## 2019-11-25 LAB — MAGNESIUM: Magnesium: 2.3 mg/dL (ref 1.7–2.4)

## 2019-11-25 MED ORDER — PANTOPRAZOLE SODIUM 40 MG PO TBEC
40.0000 mg | DELAYED_RELEASE_TABLET | Freq: Two times a day (BID) | ORAL | Status: DC
  Administered 2019-11-26 – 2019-11-30 (×2): 40 mg via ORAL
  Filled 2019-11-25 (×15): qty 1

## 2019-11-25 MED ORDER — POTASSIUM CHLORIDE CRYS ER 20 MEQ PO TBCR
40.0000 meq | EXTENDED_RELEASE_TABLET | Freq: Once | ORAL | Status: AC
  Administered 2019-11-25: 40 meq via ORAL
  Filled 2019-11-25: qty 2

## 2019-11-25 MED ORDER — LOPERAMIDE HCL 2 MG PO CAPS
2.0000 mg | ORAL_CAPSULE | ORAL | Status: DC | PRN
  Administered 2019-11-25 – 2019-11-26 (×6): 2 mg via ORAL
  Filled 2019-11-25 (×6): qty 1

## 2019-11-25 MED ORDER — CEFAZOLIN SODIUM-DEXTROSE 1-4 GM/50ML-% IV SOLN
1.0000 g | Freq: Two times a day (BID) | INTRAVENOUS | Status: AC
  Administered 2019-11-25 – 2019-11-27 (×5): 1 g via INTRAVENOUS
  Filled 2019-11-25 (×5): qty 50

## 2019-11-25 NOTE — Progress Notes (Signed)
Progress Note  Patient Name: [name] Date of Encounter: 11/25/2019  Primary Cardiologist: New to [facility] - consult by Quentin Ore  Subjective   No chest pain, dyspnea, dizziness, presyncope, or syncope.  He is for dialysis later today.  Potassium 3.7.  Magnesium 2.3.  Inpatient Medications    Scheduled Meds: . aspirin EC  81 mg Oral Daily  . Chlorhexidine Gluconate Cloth  6 each Topical Daily  . collagenase   Topical Daily  . epoetin (EPOGEN/PROCRIT) injection  10,000 Units Intravenous Q M,W,F-HD  . feeding supplement  237 mL Oral TID BM  . ferrous sulfate  325 mg Oral BID  . heparin  5,000 Units Subcutaneous Q8H  . mouth rinse  15 mL Mouth Rinse BID  . midodrine  20 mg Oral TID  . pantoprazole (PROTONIX) IV  40 mg Intravenous Q12H  . predniSONE  40 mg Oral Q breakfast   Followed by  . [START ON 11/27/2019] predniSONE  30 mg Oral Q breakfast   Followed by  . [START ON 11/29/2019] predniSONE  20 mg Oral Q breakfast   Followed by  . [START ON 12/01/2019] predniSONE  10 mg Oral Q breakfast  . sodium chloride flush  3 mL Intravenous Q12H   Continuous Infusions: . sodium chloride Stopped (11/23/19 2033)  . sodium chloride Stopped (11/23/19 1105)  .  ceFAZolin (ANCEF) IV 1 g (11/24/19 2058)   PRN Meds: sodium chloride, acetaminophen **OR** acetaminophen, atropine, dextrose, heparin, heparin sodium (porcine), HYDROcodone-acetaminophen, HYDROmorphone (DILAUDID) injection, melatonin, sodium chloride, sodium chloride flush, traZODone   Vital Signs    Vitals:   11/25/19 0359 11/25/19 0400 11/25/19 0821 11/25/19 0912  BP:  (!) 145/53 105/67   Pulse:  (!) 55 (!) 41 60  Resp:  16 17 12   Temp:  98.4 F (36.9 C) 98.5 F (36.9 C)   TempSrc:  Oral Oral   SpO2:  98% 98%   Weight: 98.9 kg     Height:       No intake or output data in the 24 hours ending 11/25/19 0916 Filed Weights   11/23/19 1844 11/24/19 0418 11/25/19 0359  Weight: 98.7 kg 99.7 kg 98.9 kg    Telemetry     Sinus bradycardia with rare PVCs and compensatory pause less than 2 seconds - Personally Reviewed  ECG    No new tracings - Personally Reviewed  Physical Exam   GEN: No acute distress.   Neck: No JVD. Cardiac: Bradycardic, no murmurs, rubs, or gallops.  Respiratory: Clear to auscultation bilaterally.  GI: Soft, nontender, non-distended.   MS: No significant bilateral lower extremity edema; right lower extremity is dressed and tender to palpation.  No deformity. Neuro:  Alert and oriented x 3; Nonfocal.  Psych: Normal affect.  Labs    Chemistry Recent Labs  Lab 11/23/19 0416 11/24/19 0545 11/25/19 0112  NA 133*  134* 135  137 134*  K 3.9  3.9 3.5  3.5 3.7  3.7  CL 99  98 97*  99 98  CO2 25  23 28  28 25   GLUCOSE 107*  109* 107*  105* 110*  BUN 26*  26* 24*  25* 34*  CREATININE 2.35*  2.24* 2.52*  2.45* 3.38*  CALCIUM 9.9  10.1 9.6  9.8 9.9  ALBUMIN 2.6* 2.5* 2.8*  GFRNONAA 33*  35* 30*  31* 21*  ANIONGAP 9  13 10  10 11      Hematology Recent Labs  Lab 11/23/19 0416 11/24/19  0545 11/25/19 0112  WBC 13.8* 15.9* 16.3*  RBC 2.72* 2.74* 3.04*  HGB 8.0* 7.9* 9.0*  HCT 25.0* 24.9* 27.6*  MCV 91.9 90.9 90.8  MCH 29.4 28.8 29.6  MCHC 32.0 31.7 32.6  RDW 16.3* 17.1* 18.1*  PLT 116* 133* 165    Cardiac EnzymesNo results for input(s): TROPONINI in the last 168 hours. No results for input(s): TROPIPOC in the last 168 hours.   BNPNo results for input(s): BNP, PROBNP in the last 168 hours.   DDimer No results for input(s): DDIMER in the last 168 hours.   Radiology    No results found.  Cardiac Studies   2D echo 11/19/2019: 1. Left ventricular ejection fraction, by estimation, is 65 to 70%. Left  ventricular ejection fraction by PLAX is 64 %. The left ventricle has  normal function. The left ventricle has no regional wall motion  abnormalities. Left ventricular diastolic  function could not be evaluated. There is the interventricular  septum is  flattened in systole and diastole, consistent with right ventricular  pressure and volume overload.  2. Right ventricular systolic function is normal. The right ventricular  size is normal.  3. The mitral valve is grossly normal. No evidence of mitral valve  regurgitation.  4. The aortic valve is normal in structure. Aortic valve regurgitation is  not visualized.  Patient Profile     49 y.o. male with history of ESRD with prior renal transplant on HD, lupus, anemia of chronic disease with baseline hemoglobin 8-9, diverticulitis, and GERD who we are seeing for evaluation of WCT.  Assessment & Plan    1.  WCT: -No recurrence within the past 4 days -Previously evaluated by EP this admission with [facility] felt to most likely represent outflow tract ventricular tachycardia driven by underlying sepsis -Previous on amiodarone though this was discontinued secondary to bradycardia -Reinitiation of amiodarone or beta-blockers are deferred at this time in the setting of absence of recurrence of VT and underlying bradycardia -Recommend maintaining electrolytes at goal with a potassium of 4.0 (can be repleted with hemodialysis today) and magnesium 2.0  2.  PAF: -Currently in sinus bradycardia -CHA2DS2-VASc zero -No indication for initiation of anticoagulation due to CHA2DS2-VASc score, anemia, and thrombocytopenia -Avoid AV nodal blocking agents with underlying bradycardia  3.  Sinus node dysfunction/bradycardia: -Asymptomatic -No prolonged pauses noted -BP stable -No indication for PPM at this time -Continue to monitor on telemetry while admitted -Reviewed in tandem with MD   For questions or updates, please contact [facility] Please consult [email or web address] for contact info under Cardiology/STEMI.    Signed, Christell Faith, PA-C [facility] Pager: [phone] 11/25/2019, 9:16 AM

## 2019-11-25 NOTE — Telephone Encounter (Signed)
-----   Message from [name], MD sent at 11/25/2019  1:44 PM EDT ----- Needs follow-up with Dr. Quentin Ore in several weeks time Discharge from hospital, had wide-complex tachycardia, bradycardia Thx TG

## 2019-11-25 NOTE — TOC Progression Note (Signed)
Transition of Care [facility]) - Progression Note    Patient Details  Name: [name] MRN: [id number] Date of Birth: February 05, 1970  Transition of Care [facility]) CM/SW [place], LCSW Phone Number: 11/25/2019, 4:53 PM  Clinical Narrative:    Patient was recommended for CIR, Pamala Hurry stated patient is out of network due to [place] benefits and we have submitted to [facility] which patient is in network with. Awaiting placement.   Expected Discharge Plan: [place] Barriers to Discharge: Continued Medical Work up  Expected Discharge Plan and Services Expected Discharge Plan: [place]   Discharge Planning Services: CM Consult Post Acute Care Choice: [place], [place] Living arrangements for the past 2 months: Single Family Home                                       Social Determinants of Health (SDOH) Interventions    Readmission Risk Interventions Readmission Risk Prevention Plan 11/24/2019  Transportation Screening Complete  PCP or Specialist Appt within 3-5 Days Complete  HRI or [place] Complete  Social Work Consult for [place] Planning/Counseling Complete  Palliative Care Screening Not Applicable  Medication Review Press photographer) Referral to Pharmacy  Some recent data might be hidden

## 2019-11-25 NOTE — Telephone Encounter (Signed)
Scheduled

## 2019-11-25 NOTE — Progress Notes (Addendum)
Inpatient Rehabilitation Admissions Coordinator  Inpatient rehabilitation consult received. Noted patient alert to self only. I have left a voicemail for [employer], ex wife to contact me to discuss his rehab needs.   Danne Baxter, RN, MSN Rehab Admissions Coordinator [phone] 11/25/2019 2:17 PM   I spoke with pt's ex wife, [place] by phone. She prefers using patient's [employer] benefits for acute inpt rehab for which [facility] is not contracted. I have alerted Acute team and TOC. We will sign off at this time.  Danne Baxter, RN, MSN Rehab Admissions Coordinator [phone] 11/25/2019 3:36 PM

## 2019-11-25 NOTE — Progress Notes (Signed)
Patient was in the process of transferring clinic prior to admission. Patient was supposed to start at [facility] last Tuesday. Patient's new schedule with them will be TTS 5:45am. Patient stated that he would transport self and that he had no transportation concerns. Clinic is asking for a COVID test to be done 5 days before starting at new clinic. If this is not done, it could result in a readmission due to lack of dialysis. Dr. Juleen China is aware. Please contact me with any dialysis placement concerns.  [name] Dialysis Coordinator [phone]

## 2019-11-25 NOTE — Progress Notes (Signed)
[facility]  ROUNDING NOTE   Subjective:  Patient resting in bed, in no acute distress. He appears confused about his discharge plans. No c/o SOB,nausea or vomiting.    Objective:  Vital signs in last 24 hours:  Temp:  [97.8 F (36.6 C)-98.5 F (36.9 C)] 97.8 F (36.6 C) (11/05 1534) Pulse Rate:  [41-60] 53 (11/05 1534) Resp:  [12-18] 16 (11/05 1534) BP: (105-145)/(46-68) 115/68 (11/05 1534) SpO2:  [95 %-100 %] 95 % (11/05 1534) Weight:  [98.9 kg] 98.9 kg (11/05 0359)  Weight change: 0.152 kg Filed Weights   11/23/19 1844 11/24/19 0418 11/25/19 0359  Weight: 98.7 kg 99.7 kg 98.9 kg    Intake/Output: I/O last 3 completed shifts: In: 168.6 [P.O.:120; IV Piggyback:48.6] Out: -    Intake/Output this shift:  No intake/output data recorded.  Physical Exam: General: In no acute distress  Head:  Moist oral mucosal membranes  Eyes: Sclerae and conjunctivae clear  Lungs:  Respirations even unlabored,Lungs clear to auscultation  Heart: S1S2, no rubs or gallops  Abdomen:  Soft, nontender  Extremities:  1+ peripheral edema.   Neurologic: Awake and alert, confused intermittently  Skin:  No new rashes or lesions  Access: Left AVF, left temp HD catheter ICU 98/33    Basic Metabolic Panel: Recent Labs  Lab 11/21/19 0330 11/21/19 1709 11/22/19 0522 11/22/19 0522 11/22/19 1628 11/22/19 1628 11/23/19 0416 11/24/19 0545 11/25/19 0112  NA 133*  132*   < > 133*  133*  --  135  --  133*  134* 135  137 134*  K 3.8  3.7   < > 3.6  3.6  --  3.9  --  3.9  3.9 3.5  3.5 3.7  3.7  CL 98  97*   < > 99  98  --  99  --  99  98 97*  99 98  CO2 25  25   < > 23  24  --  24  --  25  23 28  28 25   GLUCOSE 112*  112*   < > 118*  119*  --  125*  --  107*  109* 107*  105* 110*  BUN 28*  28*   < > 24*  24*  --  21*  --  26*  26* 24*  25* 34*  CREATININE 2.97*  3.05*   < > 2.17*  2.26*  --  1.81*  --  2.35*  2.24* 2.52*  2.45* 3.38*  CALCIUM 10.4*   10.3   < > 9.9  9.9   < > 9.6   < > 9.9  10.1 9.6  9.8 9.9  MG 2.2  --  2.6*  --   --   --  2.5* 2.2 2.3  PHOS 3.1  3.2   < > 1.7*  1.7*  --  1.6*  --  2.4*  2.4* 2.8 3.7   < > = values in this interval not displayed.    Liver Function Tests: Recent Labs  Lab 11/22/19 0522 11/22/19 1628 11/23/19 0416 11/24/19 0545 11/25/19 0112  ALBUMIN 2.8* 2.6* 2.6* 2.5* 2.8*   No results for input(s): LIPASE, AMYLASE in the last 168 hours. No results for input(s): AMMONIA in the last 168 hours.  CBC: Recent Labs  Lab 11/19/19 0600 11/19/19 0600 11/20/19 0509 11/20/19 0509 11/21/19 0330 11/22/19 0522 11/23/19 0416 11/24/19 0545 11/25/19 0112  WBC 20.2*   < > 15.5*   < > 17.0* 12.8* 13.8*  15.9* 16.3*  NEUTROABS 18.6*  --  14.1*  --  15.2* 10.8* 11.1*  --   --   HGB 8.4*   < > 8.4*   < > 8.4* 8.3* 8.0* 7.9* 9.0*  HCT 26.1*   < > 25.3*   < > 25.5* 24.9* 25.0* 24.9* 27.6*  MCV 91.6   < > 87.5   < > 88.5 87.4 91.9 90.9 90.8  PLT 102*   < > 114*   < > 131* 104* 116* 133* 165   < > = values in this interval not displayed.    Cardiac Enzymes: No results for input(s): CKTOTAL, CKMB, CKMBINDEX, TROPONINI in the last 168 hours.  BNP: Invalid input(s): POCBNP  CBG: Recent Labs  Lab 11/19/19 1540 11/19/19 2000 11/20/19 0004 11/20/19 0354 11/20/19 0727  GLUCAP 84 111* 108* 119* 109*    Microbiology: Results for orders placed or performed during the hospital encounter of 11/17/19  Wound or Superficial Culture     Status: Abnormal   Collection Time: 11/17/19  2:03 PM   Specimen: Wound  Result Value Ref Range Status   Specimen Description   Final    WOUND Performed at [facility], [address] [place], [place] [address]    Special Requests   Final    RL Performed at [facility], [address], [place], [place] [address]    Gram Stain   Final    NO WBC SEEN ABUNDANT GRAM NEGATIVE RODS FEW GRAM POSITIVE COCCI Performed at [facility], [place] [address], [place], [place] [address]    Culture MULTIPLE ORGANISMS PRESENT, NONE PREDOMINANT (A)  Final   Report Status 11/20/2019 FINAL  Final  Respiratory Panel by RT PCR (Flu A&B, Covid) - Nasopharyngeal Swab     Status: None   Collection Time: 11/17/19  2:05 PM   Specimen: Nasopharyngeal Swab  Result Value Ref Range Status   SARS Coronavirus 2 by RT PCR NEGATIVE NEGATIVE Final    Comment: (NOTE) SARS-CoV-2 target nucleic acids are NOT DETECTED.  The SARS-CoV-2 RNA is generally detectable in upper respiratoy specimens during the acute phase of infection. The lowest concentration of SARS-CoV-2 viral copies this assay can detect is 131 copies/mL. A negative result does not preclude SARS-Cov-2 infection and should not be used as the sole basis for treatment or other patient management decisions. A negative result may occur with  improper specimen collection/handling, submission of specimen other than nasopharyngeal swab, presence of viral mutation(s) within the areas targeted by this assay, and inadequate number of viral copies (<131 copies/mL). A negative result must be combined with clinical observations, patient history, and epidemiological information. The expected result is Negative.  Fact Sheet for Patients:  PinkCheek.be  Fact Sheet for Healthcare Providers:  GravelBags.it  This test is no t yet approved or cleared by the [place] FDA and  has been authorized for detection and/or diagnosis of SARS-CoV-2 by FDA under an Emergency Use Authorization (EUA). This EUA will remain  in effect (meaning this test can be used) for the duration of the COVID-19 declaration under Section 564(b)(1) of the Act, 21 U.S.C. section 360bbb-3(b)(1), unless the authorization is terminated or revoked sooner.     Influenza A by PCR NEGATIVE NEGATIVE Final   Influenza B by PCR NEGATIVE NEGATIVE Final    Comment: (NOTE) The  Xpert Xpress SARS-CoV-2/FLU/RSV assay is intended as an aid in  the diagnosis of influenza from Nasopharyngeal swab specimens and  should  not be used as a sole basis for treatment. Nasal washings and  aspirates are unacceptable for Xpert Xpress SARS-CoV-2/FLU/RSV  testing.  Fact Sheet for Patients: PinkCheek.be  Fact Sheet for Healthcare Providers: GravelBags.it  This test is not yet approved or cleared by the [place] FDA and  has been authorized for detection and/or diagnosis of SARS-CoV-2 by  FDA under an Emergency Use Authorization (EUA). This EUA will remain  in effect (meaning this test can be used) for the duration of the  Covid-19 declaration under Section 564(b)(1) of the Act, 21  U.S.C. section 360bbb-3(b)(1), unless the authorization is  terminated or revoked. Performed at [facility], [address] [place], [place] [address]   Blood Culture (routine x 2)     Status: None   Collection Time: 11/17/19  2:06 PM   Specimen: BLOOD  Result Value Ref Range Status   Specimen Description BLOOD BLOOD RIGHT HAND  Final   Special Requests   Final    BOTTLES DRAWN AEROBIC AND ANAEROBIC Blood Culture results may not be optimal due to an inadequate volume of blood received in culture bottles   Culture   Final    NO GROWTH 5 DAYS Performed at [facility], [address] [place], [place] [address]    Report Status 11/22/2019 FINAL  Final  Blood Culture (routine x 2)     Status: None   Collection Time: 11/17/19  2:06 PM   Specimen: BLOOD  Result Value Ref Range Status   Specimen Description BLOOD BLOOD RIGHT HAND  Final   Special Requests   Final    BOTTLES DRAWN AEROBIC AND ANAEROBIC Blood Culture adequate volume   Culture   Final    NO GROWTH 5 DAYS Performed at [facility], [address] [place], [place] [address]    Report Status 11/22/2019 FINAL  Final  MRSA PCR Screening      Status: Abnormal   Collection Time: 11/18/19  1:03 AM   Specimen: Nasopharyngeal  Result Value Ref Range Status   MRSA by PCR POSITIVE (A) NEGATIVE Corrected    Comment:        The GeneXpert MRSA Assay (FDA approved for NASAL specimens only), is one component of a comprehensive MRSA colonization surveillance program. It is not intended to diagnose MRSA infection nor to guide or monitor treatment for MRSA infections. RESULT CALLED TO, READ BACK BY AND VERIFIED WITH: CYNTHIA VASQUEZ @0245  ON 11/18/19 SKL Performed at [facility], [address] [place], [place] [address] CORRECTED ON 10/29 AT 0249: PREVIOUSLY REPORTED AS POSITIVE        The GeneXpert MRSA Assay (FDA approved for NASAL specimens only), is one component of a comprehensive MRSA colonization surveillance program. It is not intended to diagnose MRSA infection  nor to guide or monitor treatment for MRSA infections. CYNTHIA VASQUEZ @0245  ON 11/18/19 SKL     Coagulation Studies: No results for input(s): LABPROT, INR in the last 72 hours.  Urinalysis: No results for input(s): COLORURINE, LABSPEC, PHURINE, GLUCOSEU, HGBUR, BILIRUBINUR, KETONESUR, PROTEINUR, UROBILINOGEN, NITRITE, LEUKOCYTESUR in the last 72 hours.  Invalid input(s): APPERANCEUR    Imaging: No results found.   Medications:   . sodium chloride Stopped (11/23/19 2033)  . sodium chloride Stopped (11/23/19 1105)  .  ceFAZolin (ANCEF) IV     . aspirin EC  81 mg Oral Daily  . Chlorhexidine Gluconate Cloth  6 each Topical Daily  . collagenase   Topical Daily  .  epoetin (EPOGEN/PROCRIT) injection  10,000 Units Intravenous Q M,W,F-HD  . feeding supplement  237 mL Oral TID BM  . ferrous sulfate  325 mg Oral BID  . heparin  5,000 Units Subcutaneous Q8H  . mouth rinse  15 mL Mouth Rinse BID  . midodrine  20 mg Oral TID  . pantoprazole  40 mg Oral BID  . predniSONE  40 mg Oral Q breakfast   Followed by  . [START ON 11/27/2019] predniSONE  30  mg Oral Q breakfast   Followed by  . [START ON 11/29/2019] predniSONE  20 mg Oral Q breakfast   Followed by  . [START ON 12/01/2019] predniSONE  10 mg Oral Q breakfast  . sodium chloride flush  3 mL Intravenous Q12H   sodium chloride, acetaminophen **OR** acetaminophen, atropine, dextrose, heparin, heparin sodium (porcine), HYDROcodone-acetaminophen, HYDROmorphone (DILAUDID) injection, loperamide, melatonin, sodium chloride, sodium chloride flush, traZODone  Assessment/ Plan:  [name] is a 49 y.o. black male with end stage renal disease on hemodialysis, history of kidney transplant, lupus nephritis, and hypotension who was admitted to [facility] on 11/17/2019 for Cellulitis of right lower extremity [G99.242] ESRD on hemodialysis ([place]) [N18.6, Z99.2] Hypotension [I95.9] Severe sepsis ([place]) [A41.9, R65.20]  [facility] Left AVF 93kg   # End stage renal disease.  CRRT from 10/30-10/31, Then 11/1-11/2 Received dialysis yesterday Volume and electrolyte status acceptable No additional dialysis required today  # Sepsis/hypotension with right lower extremity cellulitis and leukocytosis. Blood cultures with no growth. Leukocytosis improving.  Normotensive today Patient is on Ancef for sepsis  #Anemia with chronic kidney disease.  We will continue monitoring CBCs Hemoglobin 9.0 today  #Secondary Hyperparathyroidism Calcium 9.9 Phosphorus 3.7 No acute indication for Phosphorus binders   LOS: 8 Samantha Olivera 11/5/20215:28 PM

## 2019-11-25 NOTE — Progress Notes (Signed)
Physical Therapy Treatment Patient Details Name: [name] MRN: [id number] DOB: 1970/01/24 Today's Date: 11/25/2019    History of Present Illness presented to ER secondary to R LE swelling, pain; admitted for management of severe cellulitis of R LE.  Hospital course additionally significant for hypotension, requiring transfer to CCU and pressor support; episode of vtach with synchronized cardioversion (10/30), CRRT (via temp dialysis cath, left) 10/30-11/2.  L temp fem cath discontinued and patient cleared for participation with session 11/24/19.    PT Comments    PT/OT Co-treat 2nd to needing +2 assist for safe functional mobility.  Pt is agreeable to session and motivated. Was able to answer orientation questions appropriately however has cognition deficits. Reports seeing things outside of window that were not there. Did consistently follow one step commands but with increased time to process. Overall poor insight of deficits. Prior to session, therapist discussed care with pt's daughter. Both pt and daughter are in agreement for comprehensive inpatient rehab prior to returning home. He was able to progress to OOB activity and even able to ambulate round EOB with +2 assistance. Recommend R knee orthotic to limit hyperextension (swedish knee). Pt is very motivated to improve but does present with endurance deficits.    Follow Up Recommendations  CIR;Other (comment) (pt will require extensive PT going forward to return to PLOF)     Equipment Recommendations  Rolling walker with 5" wheels;3in1 (PT)    Recommendations for Other Services       Precautions / Restrictions Precautions Precautions: Fall Precaution Comments: L AVF, R LE wound (ace bandaged) Restrictions Weight Bearing Restrictions: No    Mobility  Bed Mobility Overal bed mobility: Needs Assistance Bed Mobility: Supine to Sit;Sit to Supine     Supine to sit: Min assist;HOB elevated Sit to supine: Mod assist    General bed mobility comments: pt required min assist to exit bed with heavy use of bedrails and vcs throughout for technique and sequencing. required alot more assistance to return to supine from EOB sitting  Transfers Overall transfer level: Needs assistance Equipment used: Rolling walker (2 wheeled) Transfers: Sit to/from Stand Sit to Stand: +2 physical assistance;From elevated surface;Max assist         General transfer comment: MAX A x2 + RW sit<>stand x4 trials - pt hyperextends knees and has excessive forward lean. R knee block for initial standing trial  Ambulation/Gait Ambulation/Gait assistance: Mod assist;+2 physical assistance Gait Distance (Feet): 12 Feet Assistive device: Rolling walker (2 wheeled) Gait Pattern/deviations:  (BLEs hyperextension moreso in R then L) Gait velocity: decreased   General Gait Details: pt took 4 side steps 2 x prior to ambulating 12 ft with +2 assist. do not recommend ambulation with RN staff at this time       Balance Overall balance assessment: Needs assistance Sitting-balance support: No upper extremity supported;Feet supported Sitting balance-Leahy Scale: Fair     Standing balance support: Bilateral upper extremity supported Standing balance-Leahy Scale: Fair Standing balance comment: requires alot of assistance to achieve standing but once in standing with knee locked in extension is able to maintainstandiing with CGA.         Cognition Arousal/Alertness: Awake/alert Behavior During Therapy: Impulsive (likes to dictate his own care ) Overall Cognitive Status: Impaired/Different from baseline Area of Impairment: Attention;Memory;Following commands;Safety/judgement;Awareness;Problem solving      Current Attention Level: Divided;Alternating;Selective Memory: Decreased recall of precautions;Decreased short-term memory Following Commands: Follows one step commands consistently;Follows one step commands with increased  time;Follows multi-step commands  inconsistently Safety/Judgement: Decreased awareness of safety;Decreased awareness of deficits   Problem Solving: Slow processing General Comments: Pt is A and able to look at whiteboard for orientation. does have episodes of confusion/hullicinations       Exercises Other Exercises Other Exercises: Pt educated re: falls prevention, ECS, HEP, safe t/f technique Other Exercises: Toileting, LBD, sit<>stand x4 trials, sup<>sit, sitting/standing balance/tolerance, dynamic sitting/standing balance, ~15 ft in room mobility        Pertinent Vitals/Pain Pain Assessment: Faces Faces Pain Scale: Hurts even more Pain Location: tender when touching feet Pain Descriptors / Indicators: Aching;Grimacing;Moaning Pain Intervention(s): Limited activity within patient's tolerance;Monitored during session;Premedicated before session;Repositioned           PT Goals (current goals can now be found in the care plan section) Acute Rehab PT Goals Patient Stated Goal: to get better Progress towards PT goals: Progressing toward goals    Frequency    Min 2X/week      PT Plan Current plan remains appropriate    Co-evaluation   Reason for Co-Treatment: Complexity of the patient's impairments (multi-system involvement);Necessary to address cognition/behavior during functional activity;For patient/therapist safety;To address functional/ADL transfers PT goals addressed during session: Mobility/safety with mobility;Balance;Proper use of DME;Strengthening/ROM OT goals addressed during session: ADL's and self-care      AM-PAC PT "6 Clicks" Mobility   Outcome Measure  Help needed turning from your back to your side while in a flat bed without using bedrails?: A Lot Help needed moving from lying on your back to sitting on the side of a flat bed without using bedrails?: A Lot Help needed moving to and from a bed to a chair (including a wheelchair)?: Total Help needed  standing up from a chair using your arms (e.g., wheelchair or bedside chair)?: Total Help needed to walk in hospital room?: Total Help needed climbing 3-5 steps with a railing? : Total 6 Click Score: 8    End of Session Equipment Utilized During Treatment: Gait belt Activity Tolerance: Patient tolerated treatment well;Patient limited by fatigue Patient left: in bed;with bed alarm set;with call bell/phone within reach Nurse Communication: Mobility status PT Visit Diagnosis: Muscle weakness (generalized) (M62.81);Difficulty in walking, not elsewhere classified (R26.2);Pain Pain - Right/Left: Right Pain - part of body: Leg     Time: 7846-9629 PT Time Calculation (min) (ACUTE ONLY): 38 min  Charges:  $Gait Training: 8-22 mins $Therapeutic Activity: 8-22 mins                     Julaine Fusi PTA 11/25/19, 4:58 PM

## 2019-11-25 NOTE — Progress Notes (Signed)
Occupational Therapy Treatment Patient Details Name: [name] MRN: [id number] DOB: 09-07-1970 Today's Date: 11/25/2019    History of present illness presented to ER secondary to R LE swelling, pain; admitted for management of severe cellulitis of R LE.  Hospital course additionally significant for hypotension, requiring transfer to CCU and pressor support; episode of vtach with synchronized cardioversion (10/30), CRRT (via temp dialysis cath, left) 10/30-11/2.  L temp fem cath discontinued and patient cleared for participation with session 11/24/19.   OT comments  [name] was seen for OT/PT co-treatment on this date. Upon arrival to room pt reclined in bed agreeable to tx. Pt reporting pain from toe nails during LBD at bed level - required MAX A don B socks. MIN A to exit R side of bed.  MAX A x2 + RW sit<>stand x4 trials - pt hyperextends knees and has excessive forward lean. R knee block for initial standing trial. Pt requires CGA + RW for standing balance and MAX A for pericare. Pt instructed in bed level exercises, expresses appreciation for services. Pt making good progress toward goals. Pt continues to benefit from skilled OT services to maximize return to PLOF and minimize risk of future falls, injury, caregiver burden, and readmission. Will continue to follow POC. Discharge recommendation remains appropriate.    Follow Up Recommendations  CIR;Supervision/Assistance - 24 hour    Equipment Recommendations  Other (comment) (defer to next venue of care)    Recommendations for Other Services      Precautions / Restrictions Precautions Precautions: Fall Precaution Comments: L AVF, R LE wound (ace bandaged) Restrictions Weight Bearing Restrictions: No       Mobility Bed Mobility Overal bed mobility: Needs Assistance Bed Mobility: Supine to Sit;Sit to Supine     Supine to sit: Min assist;HOB elevated Sit to supine: Mod assist   General bed mobility comments: assist for BLE  return to bed  Transfers Overall transfer level: Needs assistance Equipment used: Rolling walker (2 wheeled) Transfers: Sit to/from Stand Sit to Stand: +2 physical assistance;From elevated surface;Max assist         General transfer comment: MAX A x2 + RW sit<>stand x4 trials - pt hyperextends knees and has excessive forward lean. R knee block for initial standing trial    Balance Overall balance assessment: Needs assistance Sitting-balance support: No upper extremity supported;Feet supported Sitting balance-Leahy Scale: Fair     Standing balance support: Bilateral upper extremity supported Standing balance-Leahy Scale: Fair            ADL either performed or assessed with clinical judgement   ADL Overall ADL's : Needs assistance/impaired          General ADL Comments: MAX A x2 pericare in standing - MAX A for pericare and MIN A + RW standing balance. MAX A x2 for ADL t/f - pt noted to hyperextend knee R worse than L. MAX A for LBD at bed level                Cognition Arousal/Alertness: Awake/alert Behavior During Therapy: WFL for tasks assessed/performed Overall Cognitive Status: Impaired/Different from baseline Area of Impairment: Following commands;Memory;Problem solving         Memory: Decreased recall of precautions Following Commands: Follows one step commands consistently;Follows one step commands with increased time     Problem Solving: Slow processing General Comments: A&O x4. pt stating "that man is looking at me" re: blue tarp on rooftop was a man in overalls  Exercises Exercises: Other exercises Other Exercises Other Exercises: Pt educated re: falls prevention, ECS, HEP, safe t/f technique Other Exercises: Toileting, LBD, sit<>stand x4 trials, sup<>sit, sitting/standing balance/tolerance, dynamic sitting/standing balance, ~15 ft in room mobility           Pertinent Vitals/ Pain       Pain Assessment: Faces Faces Pain Scale: Hurts  even more Pain Location: R LE; pain c socks on toenails Pain Descriptors / Indicators: Aching;Grimacing;Moaning Pain Intervention(s): Limited activity within patient's tolerance;Repositioned         Frequency  Min 2X/week        Progress Toward Goals  OT Goals(current goals can now be found in the care plan section)  Progress towards OT goals: Progressing toward goals  Acute Rehab OT Goals Patient Stated Goal: to get better OT Goal Formulation: With patient Time For Goal Achievement: 12/08/19 Potential to Achieve Goals: Good ADL Goals Pt Will Perform Grooming: with modified independence;sitting Pt Will Perform Lower Body Dressing: with min assist;sit to/from stand Pt Will Transfer to Toilet: with min assist;bedside commode Pt/caregiver will Perform Home Exercise Program: Increased strength;Both right and left upper extremity;With written HEP provided;With theraband;With Supervision  Plan Discharge plan remains appropriate;Frequency remains appropriate    Co-evaluation    PT/OT/SLP Co-Evaluation/Treatment: Yes Reason for Co-Treatment: Complexity of the patient's impairments (multi-system involvement);For patient/therapist safety;Necessary to address cognition/behavior during functional activity;To address functional/ADL transfers PT goals addressed during session: Mobility/safety with mobility;Balance;Proper use of DME OT goals addressed during session: ADL's and self-care      AM-PAC OT "6 Clicks" Daily Activity     Outcome Measure   Help from another person eating meals?: None Help from another person taking care of personal grooming?: A Little Help from another person toileting, which includes using toliet, bedpan, or urinal?: A Lot Help from another person bathing (including washing, rinsing, drying)?: A Lot Help from another person to put on and taking off regular upper body clothing?: A Little Help from another person to put on and taking off regular lower body  clothing?: A Lot 6 Click Score: 16    End of Session Equipment Utilized During Treatment: Rolling walker  OT Visit Diagnosis: Unsteadiness on feet (R26.81);Muscle weakness (generalized) (M62.81)   Activity Tolerance Patient tolerated treatment well   Patient Left in bed;with call bell/phone within reach;with bed alarm set   Nurse Communication          Time: 5009-3818 OT Time Calculation (min): 38 min  Charges: OT General Charges $OT Visit: 1 Visit OT Treatments $Self Care/Home Management : 8-22 mins  Dessie Coma, M.S. OTR/L  11/25/19, 4:48 PM  ascom [phone]

## 2019-11-25 NOTE — Progress Notes (Signed)
PROGRESS NOTE    [name]   HDQ:[id number]  DOB: [birth year]  PCP: Pcp, No    DOA: 11/17/2019 LOS: 8   Brief Narrative   [name] is a 49 y.o. male with medical history significant for end-stage renal disease on hemodialysis on Tuesday, Thursday, Saturday schedule, chronic prednisone therapy in the setting of previous kidney transplant, chronic anemia with baseline hemoglobin 8-9, diverticulitis, GERD, who is admitted on 11/17/2019 with right lower extremity cellulitis after presenting to the ED from home with worsening right lower extremity pain and swelling with purulent discharge, and associated fever/chills, rigors and general malaise.    While holding for a bed in the ED, patient became hypotensive requiring pressors, and ICU level of care for septic shock secondary to right lower extremity cellulitis.       TRH assumed care of patient on 11/23/19.  BP stable off pressors.  Had been receiving CRRT in ICU with plan for return to hemodialysis 11/3.  BP stable after dialysis, patient transferred out of ICU to progressive unit.     Assessment & Plan   Principal Problem:   Cellulitis of right lower extremity Active Problems:   Severe sepsis (HCC)   Lactic acidosis   Hypoglycemia   Anemia   GERD (gastroesophageal reflux disease)   ESRD (end stage renal disease) (HCC)   Paroxysmal atrial fibrillation (HCC)   Septic shock secondary to severe right lower extremity cellulitis - weaned off pressors, transferred out of ICU on 11/3 after hemodialysis and BP stable. Clinically improving, afebrile, leukocytosis improved.  Given severity of his infection with septic shock requiring multiple pressors. --wound culture grew multiple organisms --On Ancef, continue --Tylenol PRN fevers --Analgesics PRN --has been stress dose steroids since in ICU, tapering down to home dose prednisone as below  Hypotension - on midodrine per nephro  Acute metabolic encephalopathy - likely due  to infection and renal failure, improved.  Still confused at times as to situation.  Reports of hallucinations on 11/4.  ESRD - on hemodialysis MWF.  Nephrology following.  Removal femoral temporary catheter today.  Status post renal transplant - on chronic prednisone.  Has been on stress dose steroids since ICU.  Will taper down to usual dose of 10 mg daily, starting tomorrow with prednisone 40 mg.  Wide-complex tachycardia - Cardiology following, suspected due to sepsis.   Has not recurred in now >48 hours.  Was on amiodarone which was d/c'd due to bradycardia.  Seen by EP, agreed likely due to sepsis.   Patient has normal LV Systolic function --Telemetry monitoring --Maintain K>4.0 and Mg > 2.0  Paroxysmal A-fib - CHA2DS2-VASc score 0, no anticoagulation indicated and would be higher risk in setting of anemia and thrombocytopenia  Anemia of chronic renal disease - baseline Hbg 8-9.  On EPO.  Monitor CBC.  Hypoglycemic episode - resolved.  Required D10 infusion.  GERD  PPI     Patient BMI: Body mass index is 27.99 kg/m.   DVT prophylaxis: heparin injection 5,000 Units Start: 11/17/19 2200   Diet:  Diet Orders (From admission, onward)    Start     Ordered   11/17/19 1743  Diet renal/carb modified with fluid restriction Fluid restriction: 2000 mL Fluid; Room service appropriate? Yes; Fluid consistency: Thin  Diet effective now       Question Answer Comment  Fluid restriction: 2000 mL Fluid   Room service appropriate? Yes   Fluid consistency: Thin      11/17/19 1742  Code Status: Full Code    Subjective 11/25/19    Pt seen at bedside.  Currently right leg pain controlled.  Say he was calling his daughter to pick him up today.  Informed patient not ready for discharge and he stated understanding, but needed medicine for his diarrhea from [facility]. Reports chronic diarrhea, uses Imodium at home, ordered.  No other acute complaints at this time.  Does endorse  generalized weakness and concern about getting therapy so he can get back to walking.   Disposition Plan & Communication   Status is: Inpatient  Inpatient status remains appropriate because of severity of illness, continuing with IV antibiotics, still volume overloaded, placement underway for rehab.  Dispo: The patient is from: Home, lives alone              Anticipated d/c is to: home vs SNF pending PT eval              Anticipated d/c date is: 1-2 days              Patient currently is not medically stable for d/c.    Family Communication: none at bedside, will attempt to call    Consults, Procedures, Significant Events   Consultants:   Nephrology  Cardiology  PCCM  Procedures:   Dialysis, CRRT  Antimicrobials:  Anti-infectives (From admission, onward)   Start     Dose/Rate Route Frequency Ordered Stop   11/25/19 2200  ceFAZolin (ANCEF) IVPB 1 g/50 mL premix        1 g 100 mL/hr over 30 Minutes Intravenous Every 12 hours 11/25/19 1426 11/28/19 0959   11/23/19 1800  ceFAZolin (ANCEF) IVPB 1 g/50 mL premix  Status:  Discontinued        1 g 100 mL/hr over 30 Minutes Intravenous Every 24 hours 11/23/19 1222 11/25/19 1426   11/22/19 2200  ceFAZolin (ANCEF) IVPB 2g/100 mL premix  Status:  Discontinued        2 g 200 mL/hr over 30 Minutes Intravenous Every 12 hours 11/22/19 1136 11/23/19 1222   11/19/19 2000  vancomycin (VANCOCIN) IVPB 1000 mg/200 mL premix  Status:  Discontinued        1,000 mg 200 mL/hr over 60 Minutes Intravenous Every 24 hours 11/19/19 1422 11/21/19 1107   11/19/19 1800  ceFEPIme (MAXIPIME) 2 g in sodium chloride 0.9 % 100 mL IVPB  Status:  Discontinued        2 g 200 mL/hr over 30 Minutes Intravenous Every 12 hours 11/19/19 1422 11/22/19 1135   11/19/19 1200  vancomycin (VANCOCIN) IVPB 1000 mg/200 mL premix  Status:  Discontinued        1,000 mg 200 mL/hr over 60 Minutes Intravenous Every T-Th-Sa (Hemodialysis) 11/17/19 1836 11/18/19 1444    11/18/19 2000  ceFEPIme (MAXIPIME) 2 g in sodium chloride 0.9 % 100 mL IVPB  Status:  Discontinued        2 g 200 mL/hr over 30 Minutes Intravenous Every 24 hours 11/18/19 0154 11/18/19 0858   11/18/19 2000  ceFEPIme (MAXIPIME) 1 g in sodium chloride 0.9 % 100 mL IVPB  Status:  Discontinued        1 g 200 mL/hr over 30 Minutes Intravenous Every 24 hours 11/18/19 0858 11/19/19 1422   11/18/19 1500  cefTRIAXone (ROCEPHIN) 1 g in sodium chloride 0.9 % 100 mL IVPB  Status:  Discontinued        1 g 200 mL/hr over 30 Minutes Intravenous Every 24 hours 11/17/19  1800 11/17/19 2207   11/18/19 1442  vancomycin variable dose per unstable renal function (pharmacist dosing)  Status:  Discontinued         Does not apply See admin instructions 11/18/19 1444 11/19/19 1422   11/17/19 2245  ceFEPIme (MAXIPIME) 2 g in sodium chloride 0.9 % 100 mL IVPB        2 g 200 mL/hr over 30 Minutes Intravenous  Once 11/17/19 2214 11/18/19 0156   11/17/19 2200  clindamycin (CLEOCIN) IVPB 600 mg  Status:  Discontinued        600 mg 100 mL/hr over 30 Minutes Intravenous Every 8 hours 11/17/19 2011 11/18/19 2013   11/17/19 2000  vancomycin (VANCOREADY) IVPB 750 mg/150 mL  Status:  Discontinued        750 mg 150 mL/hr over 60 Minutes Intravenous Every 24 hours 11/17/19 1824 11/17/19 1832   11/17/19 1545  piperacillin-tazobactam (ZOSYN) IVPB 3.375 g  Status:  Discontinued        3.375 g 100 mL/hr over 30 Minutes Intravenous  Once 11/17/19 1535 11/17/19 1544   11/17/19 1545  clindamycin (CLEOCIN) IVPB 900 mg  Status:  Discontinued        900 mg 100 mL/hr over 30 Minutes Intravenous  Once 11/17/19 1535 11/17/19 1544   11/17/19 1500  vancomycin (VANCOREADY) IVPB 1250 mg/250 mL  Status:  Discontinued        1,250 mg 166.7 mL/hr over 90 Minutes Intravenous  Once 11/17/19 1410 11/17/19 1411   11/17/19 1415  vancomycin (VANCOCIN) IVPB 1000 mg/200 mL premix        1,000 mg 200 mL/hr over 60 Minutes Intravenous  Once 11/17/19  1401 11/17/19 1825   11/17/19 1415  cefTRIAXone (ROCEPHIN) 2 g in sodium chloride 0.9 % 100 mL IVPB        2 g 200 mL/hr over 30 Minutes Intravenous  Once 11/17/19 1401 11/17/19 1558         Objective   Vitals:   11/25/19 0912 11/25/19 1139 11/25/19 1534 11/25/19 2000  BP:  (!) 110/52 115/68 (!) 142/58  Pulse: 60 (!) 42 (!) 53 64  Resp: 12 17 16    Temp:  98 F (36.7 C) 97.8 F (36.6 C) 98.5 F (36.9 C)  TempSrc:  Oral Oral Oral  SpO2:  100% 95% 97%  Weight:      Height:       No intake or output data in the 24 hours ending 11/25/19 2028 Filed Weights   11/23/19 1844 11/24/19 0418 11/25/19 0359  Weight: 98.7 kg 99.7 kg 98.9 kg    Physical Exam:  General exam: awake, alert, no acute distress, confused Respiratory system: CTAB, normal respiratory effort, on room air Cardiovascular system: normal S1/S2, RRR Central nervous system: A&O x3. no gross focal neurologic deficits, normal speech Extremities: right lower extremity dressing in place, non-tender RLE, 2+ pitting edema of b/l LE's, moves all Psychiatry: confused but oriented to person place and time.  No apparent hallucinations.  Abnormal judgment and insight.   Labs   Data Reviewed: I have personally reviewed following labs and imaging studies  CBC: Recent Labs  Lab 11/19/19 0600 11/19/19 0600 11/20/19 0509 11/20/19 0509 11/21/19 0330 11/22/19 0522 11/23/19 0416 11/24/19 0545 11/25/19 0112  WBC 20.2*   < > 15.5*   < > 17.0* 12.8* 13.8* 15.9* 16.3*  NEUTROABS 18.6*  --  14.1*  --  15.2* 10.8* 11.1*  --   --   HGB 8.4*   < >  8.4*   < > 8.4* 8.3* 8.0* 7.9* 9.0*  HCT 26.1*   < > 25.3*   < > 25.5* 24.9* 25.0* 24.9* 27.6*  MCV 91.6   < > 87.5   < > 88.5 87.4 91.9 90.9 90.8  PLT 102*   < > 114*   < > 131* 104* 116* 133* 165   < > = values in this interval not displayed.   Basic Metabolic Panel: Recent Labs  Lab 11/21/19 0330 11/21/19 1709 11/22/19 0522 11/22/19 1628 11/23/19 0416 11/24/19 0545  11/25/19 0112  NA 133*  132*   < > 133*  133* 135 133*  134* 135  137 134*  K 3.8  3.7   < > 3.6  3.6 3.9 3.9  3.9 3.5  3.5 3.7  3.7  CL 98  97*   < > 99  98 99 99  98 97*  99 98  CO2 25  25   < > 23  24 24 25  23 28  28 25   GLUCOSE 112*  112*   < > 118*  119* 125* 107*  109* 107*  105* 110*  BUN 28*  28*   < > 24*  24* 21* 26*  26* 24*  25* 34*  CREATININE 2.97*  3.05*   < > 2.17*  2.26* 1.81* 2.35*  2.24* 2.52*  2.45* 3.38*  CALCIUM 10.4*  10.3   < > 9.9  9.9 9.6 9.9  10.1 9.6  9.8 9.9  MG 2.2  --  2.6*  --  2.5* 2.2 2.3  PHOS 3.1  3.2   < > 1.7*  1.7* 1.6* 2.4*  2.4* 2.8 3.7   < > = values in this interval not displayed.   GFR: Estimated Creatinine Clearance: 32.9 mL/min (A) (by C-G formula based on SCr of 3.38 mg/dL (H)). Liver Function Tests: Recent Labs  Lab 11/22/19 0522 11/22/19 1628 11/23/19 0416 11/24/19 0545 11/25/19 0112  ALBUMIN 2.8* 2.6* 2.6* 2.5* 2.8*   No results for input(s): LIPASE, AMYLASE in the last 168 hours. No results for input(s): AMMONIA in the last 168 hours. Coagulation Profile: No results for input(s): INR, PROTIME in the last 168 hours. Cardiac Enzymes: No results for input(s): CKTOTAL, CKMB, CKMBINDEX, TROPONINI in the last 168 hours. BNP (last 3 results) No results for input(s): PROBNP in the last 8760 hours. HbA1C: No results for input(s): HGBA1C in the last 72 hours. CBG: Recent Labs  Lab 11/19/19 1540 11/19/19 2000 11/20/19 0004 11/20/19 0354 11/20/19 0727  GLUCAP 84 111* 108* 119* 109*   Lipid Profile: No results for input(s): CHOL, HDL, LDLCALC, TRIG, CHOLHDL, LDLDIRECT in the last 72 hours. Thyroid Function Tests: No results for input(s): TSH, T4TOTAL, FREET4, T3FREE, THYROIDAB in the last 72 hours. Anemia Panel: No results for input(s): VITAMINB12, FOLATE, FERRITIN, TIBC, IRON, RETICCTPCT in the last 72 hours. Sepsis Labs: Recent Labs  Lab 11/18/19 2030 11/19/19 0600  PROCALCITON  --   50.67  LATICACIDVEN 1.6  --     Recent Results (from the past 240 hour(s))  Wound or Superficial Culture     Status: Abnormal   Collection Time: 11/17/19  2:03 PM   Specimen: Wound  Result Value Ref Range Status   Specimen Description   Final    WOUND Performed at [facility], [address], [place], [place] [address]    Special Requests   Final    RL Performed at [facility], [place],  [place], [place] [address]    Gram Stain   Final    NO WBC SEEN ABUNDANT GRAM NEGATIVE RODS FEW GRAM POSITIVE COCCI Performed at [facility], [place] [address], [place], [place] [address]    Culture MULTIPLE ORGANISMS PRESENT, NONE PREDOMINANT (A)  Final   Report Status 11/20/2019 FINAL  Final  Respiratory Panel by RT PCR (Flu A&B, Covid) - Nasopharyngeal Swab     Status: None   Collection Time: 11/17/19  2:05 PM   Specimen: Nasopharyngeal Swab  Result Value Ref Range Status   SARS Coronavirus 2 by RT PCR NEGATIVE NEGATIVE Final    Comment: (NOTE) SARS-CoV-2 target nucleic acids are NOT DETECTED.  The SARS-CoV-2 RNA is generally detectable in upper respiratoy specimens during the acute phase of infection. The lowest concentration of SARS-CoV-2 viral copies this assay can detect is 131 copies/mL. A negative result does not preclude SARS-Cov-2 infection and should not be used as the sole basis for treatment or other patient management decisions. A negative result may occur with  improper specimen collection/handling, submission of specimen other than nasopharyngeal swab, presence of viral mutation(s) within the areas targeted by this assay, and inadequate number of viral copies (<131 copies/mL). A negative result must be combined with clinical observations, patient history, and epidemiological information. The expected result is Negative.  Fact Sheet for Patients:  PinkCheek.be  Fact Sheet for Healthcare Providers:   GravelBags.it  This test is no t yet approved or cleared by the [place] FDA and  has been authorized for detection and/or diagnosis of SARS-CoV-2 by FDA under an Emergency Use Authorization (EUA). This EUA will remain  in effect (meaning this test can be used) for the duration of the COVID-19 declaration under Section 564(b)(1) of the Act, 21 U.S.C. section 360bbb-3(b)(1), unless the authorization is terminated or revoked sooner.     Influenza A by PCR NEGATIVE NEGATIVE Final   Influenza B by PCR NEGATIVE NEGATIVE Final    Comment: (NOTE) The Xpert Xpress SARS-CoV-2/FLU/RSV assay is intended as an aid in  the diagnosis of influenza from Nasopharyngeal swab specimens and  should not be used as a sole basis for treatment. Nasal washings and  aspirates are unacceptable for Xpert Xpress SARS-CoV-2/FLU/RSV  testing.  Fact Sheet for Patients: PinkCheek.be  Fact Sheet for Healthcare Providers: GravelBags.it  This test is not yet approved or cleared by the [place] FDA and  has been authorized for detection and/or diagnosis of SARS-CoV-2 by  FDA under an Emergency Use Authorization (EUA). This EUA will remain  in effect (meaning this test can be used) for the duration of the  Covid-19 declaration under Section 564(b)(1) of the Act, 21  U.S.C. section 360bbb-3(b)(1), unless the authorization is  terminated or revoked. Performed at [facility], [address] [place], [place] [address]   Blood Culture (routine x 2)     Status: None   Collection Time: 11/17/19  2:06 PM   Specimen: BLOOD  Result Value Ref Range Status   Specimen Description BLOOD BLOOD RIGHT HAND  Final   Special Requests   Final    BOTTLES DRAWN AEROBIC AND ANAEROBIC Blood Culture results may not be optimal due to an inadequate volume of blood received in culture bottles   Culture   Final    NO GROWTH 5  DAYS Performed at [facility], [address], [place], [place] [address]    Report Status 11/22/2019 FINAL  Final  Blood Culture (routine x 2)  Status: None   Collection Time: 11/17/19  2:06 PM   Specimen: BLOOD  Result Value Ref Range Status   Specimen Description BLOOD BLOOD RIGHT HAND  Final   Special Requests   Final    BOTTLES DRAWN AEROBIC AND ANAEROBIC Blood Culture adequate volume   Culture   Final    NO GROWTH 5 DAYS Performed at [facility], [address] [place], [place] [address]    Report Status 11/22/2019 FINAL  Final  MRSA PCR Screening     Status: Abnormal   Collection Time: 11/18/19  1:03 AM   Specimen: Nasopharyngeal  Result Value Ref Range Status   MRSA by PCR POSITIVE (A) NEGATIVE Corrected    Comment:        The GeneXpert MRSA Assay (FDA approved for NASAL specimens only), is one component of a comprehensive MRSA colonization surveillance program. It is not intended to diagnose MRSA infection nor to guide or monitor treatment for MRSA infections. RESULT CALLED TO, READ BACK BY AND VERIFIED WITH: CYNTHIA VASQUEZ @0245  ON 11/18/19 SKL Performed at [facility], [address] [place], [place] [address] CORRECTED ON 10/29 AT 0249: PREVIOUSLY REPORTED AS POSITIVE        The GeneXpert MRSA Assay (FDA approved for NASAL specimens only), is one component of a comprehensive MRSA colonization surveillance program. It is not intended to diagnose MRSA infection  nor to guide or monitor treatment for MRSA infections. CYNTHIA VASQUEZ @0245  ON 11/18/19 SKL       Imaging Studies   No results found.   Medications   Scheduled Meds: . aspirin EC  81 mg Oral Daily  . Chlorhexidine Gluconate Cloth  6 each Topical Daily  . collagenase   Topical Daily  . epoetin (EPOGEN/PROCRIT) injection  10,000 Units Intravenous Q M,W,F-HD  . feeding supplement  237 mL Oral TID BM  . ferrous sulfate  325 mg Oral BID  . heparin  5,000 Units  Subcutaneous Q8H  . mouth rinse  15 mL Mouth Rinse BID  . midodrine  20 mg Oral TID  . pantoprazole  40 mg Oral BID  . predniSONE  40 mg Oral Q breakfast   Followed by  . [START ON 11/27/2019] predniSONE  30 mg Oral Q breakfast   Followed by  . [START ON 11/29/2019] predniSONE  20 mg Oral Q breakfast   Followed by  . [START ON 12/01/2019] predniSONE  10 mg Oral Q breakfast  . sodium chloride flush  3 mL Intravenous Q12H   Continuous Infusions: . sodium chloride Stopped (11/23/19 2033)  . sodium chloride Stopped (11/23/19 1105)  .  ceFAZolin (ANCEF) IV         LOS: 8 days    Time spent: 25 minutes with > 50% spent in coordination of care and direct patient contact.    Ezekiel Slocumb, DO [facility]  11/25/2019, 8:28 PM    If 7PM-7AM, please contact night-coverage. How to contact the [facility] Attending or Consulting provider [place] or covering provider during after hours [place], for this patient?    1. Check the care team in [facility] and look for a) attending/consulting [facility] provider listed and b) the [facility] team listed 2. Log into [email or web address] and use [place]'s universal password to access. If you do not have the password, please contact the hospital operator. 3. Locate the [facility] provider you are looking for under [facility] and page to a number that you can be directly reached.  4. If you still have difficulty reaching the provider, please page the [facility] (Director on Call) for the Hospitalists listed on amion for assistance.

## 2019-11-25 NOTE — TOC Progression Note (Signed)
Transition of Care [facility]) - Progression Note    Patient Details  Name: [name] MRN: [id number] Date of Birth: 08-26-1970  Transition of Care [facility] CM/SW [place], LCSW Phone Number: 11/25/2019, 5:26 PM  Clinical Narrative:    CSW received response from [place] at [facility]. Patient is out of network and has been referred to [place] or [facility] CSW has faxed info waiting for response  Expected Discharge Plan: [place] Barriers to Discharge: Continued Medical Work up  Expected Discharge Plan and Services Expected Discharge Plan: [place]   Discharge Planning Services: CM Consult Post Acute Care Choice: [place], [place] Living arrangements for the past 2 months: Single Family Home                                       Social Determinants of Health (SDOH) Interventions    Readmission Risk Interventions Readmission Risk Prevention Plan 11/24/2019  Transportation Screening Complete  PCP or Specialist Appt within 3-5 Days Complete  HRI or [place] Complete  Social Work Consult for [place] Planning/Counseling Complete  Palliative Care Screening Not Applicable  Medication Review Press photographer) Referral to Pharmacy  Some recent data might be hidden

## 2019-11-26 DIAGNOSIS — I480 Paroxysmal atrial fibrillation: Secondary | ICD-10-CM | POA: Diagnosis not present

## 2019-11-26 DIAGNOSIS — L03115 Cellulitis of right lower limb: Secondary | ICD-10-CM | POA: Diagnosis not present

## 2019-11-26 DIAGNOSIS — N186 End stage renal disease: Secondary | ICD-10-CM | POA: Diagnosis not present

## 2019-11-26 LAB — CBC WITH DIFFERENTIAL/PLATELET
Abs Immature Granulocytes: 0.64 10*3/uL — ABNORMAL HIGH (ref 0.00–0.07)
Basophils Absolute: 0 10*3/uL (ref 0.0–0.1)
Basophils Relative: 0 %
Eosinophils Absolute: 0 10*3/uL (ref 0.0–0.5)
Eosinophils Relative: 0 %
HCT: 29.8 % — ABNORMAL LOW (ref 39.0–52.0)
Hemoglobin: 9.5 g/dL — ABNORMAL LOW (ref 13.0–17.0)
Immature Granulocytes: 6 %
Lymphocytes Relative: 8 %
Lymphs Abs: 1 10*3/uL (ref 0.7–4.0)
MCH: 29.2 pg (ref 26.0–34.0)
MCHC: 31.9 g/dL (ref 30.0–36.0)
MCV: 91.7 fL (ref 80.0–100.0)
Monocytes Absolute: 0.8 10*3/uL (ref 0.1–1.0)
Monocytes Relative: 7 %
Neutro Abs: 9.2 10*3/uL — ABNORMAL HIGH (ref 1.7–7.7)
Neutrophils Relative %: 79 %
Platelets: 158 10*3/uL (ref 150–400)
RBC: 3.25 MIL/uL — ABNORMAL LOW (ref 4.22–5.81)
RDW: 19.3 % — ABNORMAL HIGH (ref 11.5–15.5)
Smear Review: NORMAL
WBC: 11.7 10*3/uL — ABNORMAL HIGH (ref 4.0–10.5)
nRBC: 1.3 % — ABNORMAL HIGH (ref 0.0–0.2)

## 2019-11-26 LAB — RENAL FUNCTION PANEL
Albumin: 2.8 g/dL — ABNORMAL LOW (ref 3.5–5.0)
Anion gap: 13 (ref 5–15)
BUN: 50 mg/dL — ABNORMAL HIGH (ref 6–20)
CO2: 24 mmol/L (ref 22–32)
Calcium: 9.9 mg/dL (ref 8.9–10.3)
Chloride: 96 mmol/L — ABNORMAL LOW (ref 98–111)
Creatinine, Ser: 4.66 mg/dL — ABNORMAL HIGH (ref 0.61–1.24)
GFR, Estimated: 14 mL/min — ABNORMAL LOW (ref >60)
Glucose, Bld: 97 mg/dL (ref 70–99)
Phosphorus: 5.2 mg/dL — ABNORMAL HIGH (ref 2.5–4.6)
Potassium: 5 mmol/L (ref 3.5–5.1)
Sodium: 133 mmol/L — ABNORMAL LOW (ref 135–145)

## 2019-11-26 LAB — MAGNESIUM: Magnesium: 2.4 mg/dL (ref 1.7–2.4)

## 2019-11-26 MED ORDER — LOPERAMIDE HCL 2 MG PO CAPS
2.0000 mg | ORAL_CAPSULE | ORAL | Status: DC | PRN
  Administered 2019-11-26 – 2019-12-05 (×44): 2 mg via ORAL
  Filled 2019-11-26 (×46): qty 1

## 2019-11-26 NOTE — Plan of Care (Signed)
  Problem: Health Behavior/Discharge Planning: Goal: Ability to manage health-related needs will improve Outcome: Progressing   Problem: Clinical Measurements: Goal: Ability to maintain clinical measurements within normal limits will improve Outcome: Progressing Goal: Will remain free from infection Outcome: Progressing Goal: Diagnostic test results will improve Outcome: Progressing Goal: Cardiovascular complication will be avoided Outcome: Progressing   Problem: Activity: Goal: Risk for activity intolerance will decrease Outcome: Progressing   Problem: Nutrition: Goal: Adequate nutrition will be maintained Outcome: Progressing   Problem: Coping: Goal: Level of anxiety will decrease Outcome: Progressing   Problem: Elimination: Goal: Will not experience complications related to bowel motility Outcome: Progressing   Problem: Safety: Goal: Ability to remain free from injury will improve Outcome: Progressing   Problem: Skin Integrity: Goal: Risk for impaired skin integrity will decrease Outcome: Progressing   Problem: Fluid Volume: Goal: Hemodynamic stability will improve Outcome: Progressing   Problem: Clinical Measurements: Goal: Diagnostic test results will improve Outcome: Progressing Goal: Signs and symptoms of infection will decrease Outcome: Progressing   Problem: Respiratory: Goal: Ability to maintain adequate ventilation will improve Outcome: Progressing

## 2019-11-26 NOTE — Progress Notes (Signed)
[facility]  ROUNDING NOTE   Subjective:  Patient seen during dialysis Tolerating well    HEMODIALYSIS FLOWSHEET:  Blood Flow Rate (mL/min): 400 mL/min Arterial Pressure (mmHg): -170 mmHg Venous Pressure (mmHg): 180 mmHg Transmembrane Pressure (mmHg): 70 mmHg Ultrafiltration Rate (mL/min): 1170 mL/min Dialysate Flow Rate (mL/min): 600 ml/min Conductivity: Machine : 14.1 Conductivity: Machine : 14.1 Dialysis Fluid Bolus: Normal Saline Bolus Amount (mL): 300 mL  Patient has large amount of lower extremity edema Complains of pain and tenderness in the lower extremities    Objective:  Vital signs in last 24 hours:  Temp:  [97.8 F (36.6 C)-98.5 F (36.9 C)] 98.5 F (36.9 C) (11/06 1240) Pulse Rate:  [39-64] 39 (11/06 1245) Resp:  [15-17] 15 (11/06 1240) BP: (101-145)/(58-71) 129/61 (11/06 1245) SpO2:  [95 %-100 %] 100 % (11/06 1245) Weight:  [98.6 kg-100.7 kg] 100.7 kg (11/06 0623)  Weight change: -0.3 kg Filed Weights   11/25/19 0359 11/26/19 0546 11/26/19 0623  Weight: 98.9 kg 98.6 kg 100.7 kg    Intake/Output: I/O last 3 completed shifts: In: 290 [P.O.:240; IV Piggyback:50] Out: 0    Intake/Output this shift:  Total I/O In: 360 [P.O.:360] Out: 0   Physical Exam: General: In no acute distress  Head:  Moist oral mucosal membranes  Eyes: Sclerae and conjunctivae clear  Lungs:  Respirations even unlabored,Lungs clear to auscultation  Heart: S1S2, no rubs or gallops  Abdomen:  Soft, nontender  Extremities:  2+ peripheral edema. ,  Tender to touch  Neurologic: Awake and alert, confused intermittently  Skin:  No new rashes or lesions  Access: Left AVG    Basic Metabolic Panel: Recent Labs  Lab 11/22/19 0522 11/22/19 0522 11/22/19 1628 11/22/19 1628 11/23/19 0416 11/23/19 0416 11/24/19 0545 11/25/19 0112 11/26/19 0544  NA 133*  133*   < > 135  --  133*  134*  --  135  137 134* 133*  K 3.6  3.6   < > 3.9  --  3.9  3.9  --  3.5   3.5 3.7  3.7 5.0  CL 99  98   < > 99  --  99  98  --  97*  99 98 96*  CO2 23  24   < > 24  --  25  23  --  28  28 25 24   GLUCOSE 118*  119*   < > 125*  --  107*  109*  --  107*  105* 110* 97  BUN 24*  24*   < > 21*  --  26*  26*  --  24*  25* 34* 50*  CREATININE 2.17*  2.26*   < > 1.81*  --  2.35*  2.24*  --  2.52*  2.45* 3.38* 4.66*  CALCIUM 9.9  9.9   < > 9.6   < > 9.9  10.1   < > 9.6  9.8 9.9 9.9  MG 2.6*  --   --   --  2.5*  --  2.2 2.3 2.4  PHOS 1.7*  1.7*   < > 1.6*  --  2.4*  2.4*  --  2.8 3.7 5.2*   < > = values in this interval not displayed.    Liver Function Tests: Recent Labs  Lab 11/22/19 1628 11/23/19 0416 11/24/19 0545 11/25/19 0112 11/26/19 0544  ALBUMIN 2.6* 2.6* 2.5* 2.8* 2.8*   No results for input(s): LIPASE, AMYLASE in the last 168 hours. No results for input(s):  AMMONIA in the last 168 hours.  CBC: Recent Labs  Lab 11/20/19 0509 11/20/19 0509 11/21/19 0330 11/21/19 0330 11/22/19 0522 11/23/19 0416 11/24/19 0545 11/25/19 0112 11/26/19 0544  WBC 15.5*   < > 17.0*   < > 12.8* 13.8* 15.9* 16.3* 11.7*  NEUTROABS 14.1*  --  15.2*  --  10.8* 11.1*  --   --  9.2*  HGB 8.4*   < > 8.4*   < > 8.3* 8.0* 7.9* 9.0* 9.5*  HCT 25.3*   < > 25.5*   < > 24.9* 25.0* 24.9* 27.6* 29.8*  MCV 87.5   < > 88.5   < > 87.4 91.9 90.9 90.8 91.7  PLT 114*   < > 131*   < > 104* 116* 133* 165 158   < > = values in this interval not displayed.    Cardiac Enzymes: No results for input(s): CKTOTAL, CKMB, CKMBINDEX, TROPONINI in the last 168 hours.  BNP: Invalid input(s): POCBNP  CBG: Recent Labs  Lab 11/19/19 1540 11/19/19 2000 11/20/19 0004 11/20/19 0354 11/20/19 0727  GLUCAP 84 111* 108* 119* 109*    Microbiology: Results for orders placed or performed during the hospital encounter of 11/17/19  Wound or Superficial Culture     Status: Abnormal   Collection Time: 11/17/19  2:03 PM   Specimen: Wound  Result Value Ref Range Status   Specimen  Description   Final    WOUND Performed at [facility], [address], [place], [place] [address]    Special Requests   Final    RL Performed at [facility], [address] [place], [place] [address]    Gram Stain   Final    NO WBC SEEN ABUNDANT GRAM NEGATIVE RODS FEW GRAM POSITIVE COCCI Performed at [facility], [place] [address] [place], [place] [address]    Culture MULTIPLE ORGANISMS PRESENT, NONE PREDOMINANT (A)  Final   Report Status 11/20/2019 FINAL  Final  Respiratory Panel by RT PCR (Flu A&B, Covid) - Nasopharyngeal Swab     Status: None   Collection Time: 11/17/19  2:05 PM   Specimen: Nasopharyngeal Swab  Result Value Ref Range Status   SARS Coronavirus 2 by RT PCR NEGATIVE NEGATIVE Final    Comment: (NOTE) SARS-CoV-2 target nucleic acids are NOT DETECTED.  The SARS-CoV-2 RNA is generally detectable in upper respiratoy specimens during the acute phase of infection. The lowest concentration of SARS-CoV-2 viral copies this assay can detect is 131 copies/mL. A negative result does not preclude SARS-Cov-2 infection and should not be used as the sole basis for treatment or other patient management decisions. A negative result may occur with  improper specimen collection/handling, submission of specimen other than nasopharyngeal swab, presence of viral mutation(s) within the areas targeted by this assay, and inadequate number of viral copies (<131 copies/mL). A negative result must be combined with clinical observations, patient history, and epidemiological information. The expected result is Negative.  Fact Sheet for Patients:  PinkCheek.be  Fact Sheet for Healthcare Providers:  GravelBags.it  This test is no t yet approved or cleared by the [place] FDA and  has been authorized for detection and/or diagnosis of SARS-CoV-2 by FDA under an Emergency Use Authorization (EUA). This  EUA will remain  in effect (meaning this test can be used) for the duration of the COVID-19 declaration under Section 564(b)(1) of the Act, 21 U.S.C. section 360bbb-3(b)(1), unless the authorization is terminated or revoked sooner.  Influenza A by PCR NEGATIVE NEGATIVE Final   Influenza B by PCR NEGATIVE NEGATIVE Final    Comment: (NOTE) The Xpert Xpress SARS-CoV-2/FLU/RSV assay is intended as an aid in  the diagnosis of influenza from Nasopharyngeal swab specimens and  should not be used as a sole basis for treatment. Nasal washings and  aspirates are unacceptable for Xpert Xpress SARS-CoV-2/FLU/RSV  testing.  Fact Sheet for Patients: PinkCheek.be  Fact Sheet for Healthcare Providers: GravelBags.it  This test is not yet approved or cleared by the [place] FDA and  has been authorized for detection and/or diagnosis of SARS-CoV-2 by  FDA under an Emergency Use Authorization (EUA). This EUA will remain  in effect (meaning this test can be used) for the duration of the  Covid-19 declaration under Section 564(b)(1) of the Act, 21  U.S.C. section 360bbb-3(b)(1), unless the authorization is  terminated or revoked. Performed at [facility], [address] [place], [place] [address]   Blood Culture (routine x 2)     Status: None   Collection Time: 11/17/19  2:06 PM   Specimen: BLOOD  Result Value Ref Range Status   Specimen Description BLOOD BLOOD RIGHT HAND  Final   Special Requests   Final    BOTTLES DRAWN AEROBIC AND ANAEROBIC Blood Culture results may not be optimal due to an inadequate volume of blood received in culture bottles   Culture   Final    NO GROWTH 5 DAYS Performed at [facility], [address] [place], [place] [address]    Report Status 11/22/2019 FINAL  Final  Blood Culture (routine x 2)     Status: None   Collection Time: 11/17/19  2:06 PM   Specimen: BLOOD  Result Value  Ref Range Status   Specimen Description BLOOD BLOOD RIGHT HAND  Final   Special Requests   Final    BOTTLES DRAWN AEROBIC AND ANAEROBIC Blood Culture adequate volume   Culture   Final    NO GROWTH 5 DAYS Performed at [facility], [address], [place], [place] [address]    Report Status 11/22/2019 FINAL  Final  MRSA PCR Screening     Status: Abnormal   Collection Time: 11/18/19  1:03 AM   Specimen: Nasopharyngeal  Result Value Ref Range Status   MRSA by PCR POSITIVE (A) NEGATIVE Corrected    Comment:        The GeneXpert MRSA Assay (FDA approved for NASAL specimens only), is one component of a comprehensive MRSA colonization surveillance program. It is not intended to diagnose MRSA infection nor to guide or monitor treatment for MRSA infections. RESULT CALLED TO, READ BACK BY AND VERIFIED WITH: CYNTHIA VASQUEZ @0245  ON 11/18/19 SKL Performed at [facility], [address] [place], [place] [address] CORRECTED ON 10/29 AT 0249: PREVIOUSLY REPORTED AS POSITIVE        The GeneXpert MRSA Assay (FDA approved for NASAL specimens only), is one component of a comprehensive MRSA colonization surveillance program. It is not intended to diagnose MRSA infection  nor to guide or monitor treatment for MRSA infections. CYNTHIA VASQUEZ @0245  ON 11/18/19 SKL     Coagulation Studies: No results for input(s): LABPROT, INR in the last 72 hours.  Urinalysis: No results for input(s): COLORURINE, LABSPEC, PHURINE, GLUCOSEU, HGBUR, BILIRUBINUR, KETONESUR, PROTEINUR, UROBILINOGEN, NITRITE, LEUKOCYTESUR in the last 72 hours.  Invalid input(s): APPERANCEUR    Imaging: No results found.   Medications:   . sodium chloride Stopped (11/23/19 2033)  .  sodium chloride Stopped (11/23/19 1105)  .  ceFAZolin (ANCEF) IV 1 g (11/26/19 0846)   . aspirin EC  81 mg Oral Daily  . Chlorhexidine Gluconate Cloth  6 each Topical Daily  . collagenase   Topical Daily  . epoetin  (EPOGEN/PROCRIT) injection  10,000 Units Intravenous Q M,W,F-HD  . feeding supplement  237 mL Oral TID BM  . ferrous sulfate  325 mg Oral BID  . heparin  5,000 Units Subcutaneous Q8H  . mouth rinse  15 mL Mouth Rinse BID  . midodrine  20 mg Oral TID  . pantoprazole  40 mg Oral BID  . [START ON 11/27/2019] predniSONE  30 mg Oral Q breakfast   Followed by  . [START ON 11/29/2019] predniSONE  20 mg Oral Q breakfast   Followed by  . [START ON 12/01/2019] predniSONE  10 mg Oral Q breakfast  . sodium chloride flush  3 mL Intravenous Q12H   sodium chloride, acetaminophen **OR** acetaminophen, atropine, dextrose, heparin, heparin sodium (porcine), HYDROcodone-acetaminophen, HYDROmorphone (DILAUDID) injection, loperamide, melatonin, sodium chloride, sodium chloride flush, traZODone  Assessment/ Plan:  [name] is a 49 y.o. black male with end stage renal disease on hemodialysis, history of kidney transplant, lupus nephritis, and hypotension who was admitted to [facility] on 11/17/2019 for Cellulitis of right lower extremity [X83.382] ESRD on hemodialysis ([place]) [N18.6, Z99.2] Hypotension [I95.9] Severe sepsis ([place]) [A41.9, R65.20]  [facility] Left AVG 93kg   # End stage renal disease.  CRRT from 10/30-10/31, Then 11/1-11/2 Patient seen during dialysis Tolerating well  Volume removal as tolerated  # Sepsis/hypotension with right lower extremity cellulitis and leukocytosis. Blood cultures with no growth. Leukocytosis improving.  Normotensive today Patient is on Ancef for sepsis  #Anemia with chronic kidney disease.  We will continue monitoring CBCs Lab Results  Component Value Date   HGB 9.5 (L) 11/26/2019     #Secondary Hyperparathyroidism Lab Results  Component Value Date   CALCIUM 9.9 11/26/2019   PHOS 5.2 (H) 11/26/2019  monitor Ca and phos this admission     LOS: [place] 11/6/20212:30 PM

## 2019-11-26 NOTE — Progress Notes (Addendum)
PROGRESS NOTE    ROMARIO TITH   NMM:[id number]  DOB: 29-Dec-1970  PCP: Pcp, No    DOA: 11/17/2019 LOS: 9   Brief Narrative   [name] is a 49 y.o. male with medical history significant for end-stage renal disease on hemodialysis on Tuesday, Thursday, Saturday schedule, chronic prednisone therapy in the setting of previous kidney transplant, chronic anemia with baseline hemoglobin 8-9, diverticulitis, GERD, who is admitted on 11/17/2019 with right lower extremity cellulitis after presenting to the ED from home with worsening right lower extremity pain and swelling with purulent discharge, and associated fever/chills, rigors and general malaise.    While holding for a bed in the ED, patient became hypotensive requiring pressors, and ICU level of care for septic shock secondary to right lower extremity cellulitis.       [facility] assumed care of patient on 11/23/19.  BP stable off pressors.  Had been receiving CRRT in ICU with plan for return to hemodialysis 11/3.  BP stable after dialysis, patient transferred out of ICU to progressive unit.     Assessment & Plan   Principal Problem:   Cellulitis of right lower extremity Active Problems:   Severe sepsis (HCC)   Lactic acidosis   Hypoglycemia   Anemia   GERD (gastroesophageal reflux disease)   ESRD (end stage renal disease) (HCC)   Paroxysmal atrial fibrillation (HCC)   Septic shock secondary to severe right lower extremity cellulitis - weaned off pressors, transferred out of ICU on 11/3 after hemodialysis and BP stable. Clinically improving, afebrile, leukocytosis improved.  Given severity of his infection with septic shock requiring multiple pressors. --wound culture grew multiple organisms --On Ancef, continue - last day tomorrow --Tylenol PRN fevers --Analgesics PRN  Hypotension - on midodrine per nephro  Acute metabolic encephalopathy - likely due to infection and renal failure, improved.  Still confused at times as to  situation.  Reports of hallucinations on 11/4.  ESRD - on hemodialysis MWF.  Nephrology following.  Removal femoral temporary catheter today.  Status post renal transplant - on chronic prednisone.  Has been on stress dose steroids since ICU.   On taper of prednisone down to usual dose of 10 mg daily.  Wide-complex tachycardia - Cardiology following, suspected due to sepsis.   Has not recurred in now several days.   Was on amiodarone which was d/c'd due to bradycardia.  Seen by EP, agreed likely due to sepsis.    Patient has normal LV Systolic function --Telemetry monitoring --Maintain K>4.0 and Mg > 2.0  Paroxysmal A-fib - CHA2DS2-VASc score 0, no anticoagulation indicated and would be higher risk in setting of anemia and thrombocytopenia  Anemia of chronic renal disease - baseline Hbg 8-9.  On EPO.  Monitor CBC.  Hypoglycemic episode - resolved.  Required D10 infusion.  GERD  PPI  Thrombocytopenia - POA, likely due to sepsis.  Resolved.  Monitor CBC.   Patient BMI: Body mass index is 28.5 kg/m.   DVT prophylaxis: heparin injection 5,000 Units Start: 11/17/19 2200   Diet:  (regular because patient refuses renal or low sodium diet)  Diet Orders (From admission, onward)    Start     Ordered   11/26/19 1114  Diet regular Room service appropriate? Yes; Fluid consistency: Thin  Diet effective now       Question Answer Comment  Room service appropriate? Yes   Fluid consistency: Thin      11/26/19 1113  Code Status: Full Code    Subjective 11/26/19    Pt seen at bedside.  Says Imodium needed more frequently to control his chronic diarrhea.  Leg pain okay right now.  No fever/chills.  Has dialysis today.  Patient insists to have a regular diet, because he eats regular food at home, and says not tolerating "tasteless" food here.  Discussed importance of low sodium low potassium etc, but he is adamant and demands diet order be changed before lunch  today.   Disposition Plan & Communication   Status is: Inpatient  Inpatient status remains appropriate because of severity of illness, continuing with IV antibiotics, still volume overloaded, placement underway for rehab.  Dispo: The patient is from: Home, lives alone              Anticipated d/c is to: CIR              Anticipated d/c date is: 1-2 days              Patient currently is not medically stable for d/c.    Family Communication: none at bedside, will attempt to call    Consults, Procedures, Significant Events   Consultants:   Nephrology  Cardiology  PCCM  Procedures:   Dialysis, CRRT  Antimicrobials:  Anti-infectives (From admission, onward)   Start     Dose/Rate Route Frequency Ordered Stop   11/25/19 2200  ceFAZolin (ANCEF) IVPB 1 g/50 mL premix        1 g 100 mL/hr over 30 Minutes Intravenous Every 12 hours 11/25/19 1426 11/28/19 0959   11/23/19 1800  ceFAZolin (ANCEF) IVPB 1 g/50 mL premix  Status:  Discontinued        1 g 100 mL/hr over 30 Minutes Intravenous Every 24 hours 11/23/19 1222 11/25/19 1426   11/22/19 2200  ceFAZolin (ANCEF) IVPB 2g/100 mL premix  Status:  Discontinued        2 g 200 mL/hr over 30 Minutes Intravenous Every 12 hours 11/22/19 1136 11/23/19 1222   11/19/19 2000  vancomycin (VANCOCIN) IVPB 1000 mg/200 mL premix  Status:  Discontinued        1,000 mg 200 mL/hr over 60 Minutes Intravenous Every 24 hours 11/19/19 1422 11/21/19 1107   11/19/19 1800  ceFEPIme (MAXIPIME) 2 g in sodium chloride 0.9 % 100 mL IVPB  Status:  Discontinued        2 g 200 mL/hr over 30 Minutes Intravenous Every 12 hours 11/19/19 1422 11/22/19 1135   11/19/19 1200  vancomycin (VANCOCIN) IVPB 1000 mg/200 mL premix  Status:  Discontinued        1,000 mg 200 mL/hr over 60 Minutes Intravenous Every T-Th-Sa (Hemodialysis) 11/17/19 1836 11/18/19 1444   11/18/19 2000  ceFEPIme (MAXIPIME) 2 g in sodium chloride 0.9 % 100 mL IVPB  Status:  Discontinued        2  g 200 mL/hr over 30 Minutes Intravenous Every 24 hours 11/18/19 0154 11/18/19 0858   11/18/19 2000  ceFEPIme (MAXIPIME) 1 g in sodium chloride 0.9 % 100 mL IVPB  Status:  Discontinued        1 g 200 mL/hr over 30 Minutes Intravenous Every 24 hours 11/18/19 0858 11/19/19 1422   11/18/19 1500  cefTRIAXone (ROCEPHIN) 1 g in sodium chloride 0.9 % 100 mL IVPB  Status:  Discontinued        1 g 200 mL/hr over 30 Minutes Intravenous Every 24 hours 11/17/19 1800 11/17/19 2207   11/18/19 1442  vancomycin variable dose per unstable renal function (pharmacist dosing)  Status:  Discontinued         Does not apply See admin instructions 11/18/19 1444 11/19/19 1422   11/17/19 2245  ceFEPIme (MAXIPIME) 2 g in sodium chloride 0.9 % 100 mL IVPB        2 g 200 mL/hr over 30 Minutes Intravenous  Once 11/17/19 2214 11/18/19 0156   11/17/19 2200  clindamycin (CLEOCIN) IVPB 600 mg  Status:  Discontinued        600 mg 100 mL/hr over 30 Minutes Intravenous Every 8 hours 11/17/19 2011 11/18/19 2013   11/17/19 2000  vancomycin (VANCOREADY) IVPB 750 mg/150 mL  Status:  Discontinued        750 mg 150 mL/hr over 60 Minutes Intravenous Every 24 hours 11/17/19 1824 11/17/19 1832   11/17/19 1545  piperacillin-tazobactam (ZOSYN) IVPB 3.375 g  Status:  Discontinued        3.375 g 100 mL/hr over 30 Minutes Intravenous  Once 11/17/19 1535 11/17/19 1544   11/17/19 1545  clindamycin (CLEOCIN) IVPB 900 mg  Status:  Discontinued        900 mg 100 mL/hr over 30 Minutes Intravenous  Once 11/17/19 1535 11/17/19 1544   11/17/19 1500  vancomycin (VANCOREADY) IVPB 1250 mg/250 mL  Status:  Discontinued        1,250 mg 166.7 mL/hr over 90 Minutes Intravenous  Once 11/17/19 1410 11/17/19 1411   11/17/19 1415  vancomycin (VANCOCIN) IVPB 1000 mg/200 mL premix        1,000 mg 200 mL/hr over 60 Minutes Intravenous  Once 11/17/19 1401 11/17/19 1825   11/17/19 1415  cefTRIAXone (ROCEPHIN) 2 g in sodium chloride 0.9 % 100 mL IVPB        2  g 200 mL/hr over 30 Minutes Intravenous  Once 11/17/19 1401 11/17/19 1558         Objective   Vitals:   11/26/19 1146 11/26/19 1240 11/26/19 1245 11/26/19 1530  BP: 101/65 125/63 129/61 (!) 143/60  Pulse: (!) 53 (!) 54 (!) 39   Resp: 17 15  12   Temp: 98.3 F (36.8 C) 98.5 F (36.9 C)    TempSrc: Oral Oral    SpO2: 97%  100%   Weight:      Height:        Intake/Output Summary (Last 24 hours) at 11/26/2019 1607 Last data filed at 11/26/2019 1150 Gross per 24 hour  Intake 650 ml  Output 0 ml  Net 650 ml   Filed Weights   11/25/19 0359 11/26/19 0546 11/26/19 0623  Weight: 98.9 kg 98.6 kg 100.7 kg    Physical Exam:  General exam: awake, alert, no acute distress Respiratory system: CTAB, normal respiratory effort, on room air Cardiovascular system: normal S1/S2, RRR Central nervous system: A&O x3. no gross focal neurologic deficits, normal speech Extremities: right lower extremity unwrapped and underlying wound appears clean without purulence or drainage, does have pitting lower extremity edema. Psychiatry: normal mood with congruent affect, seems less confused   Labs   Data Reviewed: I have personally reviewed following labs and imaging studies  CBC: Recent Labs  Lab 11/20/19 0509 11/20/19 0509 11/21/19 0330 11/21/19 0330 11/22/19 0522 11/23/19 0416 11/24/19 0545 11/25/19 0112 11/26/19 0544  WBC 15.5*   < > 17.0*   < > 12.8* 13.8* 15.9* 16.3* 11.7*  NEUTROABS 14.1*  --  15.2*  --  10.8* 11.1*  --   --  9.2*  HGB 8.4*   < >  8.4*   < > 8.3* 8.0* 7.9* 9.0* 9.5*  HCT 25.3*   < > 25.5*   < > 24.9* 25.0* 24.9* 27.6* 29.8*  MCV 87.5   < > 88.5   < > 87.4 91.9 90.9 90.8 91.7  PLT 114*   < > 131*   < > 104* 116* 133* 165 158   < > = values in this interval not displayed.   Basic Metabolic Panel: Recent Labs  Lab 11/22/19 0522 11/22/19 0522 11/22/19 1628 11/23/19 0416 11/24/19 0545 11/25/19 0112 11/26/19 0544  NA 133*  133*   < > 135 133*  134* 135  137  134* 133*  K 3.6  3.6   < > 3.9 3.9  3.9 3.5  3.5 3.7  3.7 5.0  CL 99  98   < > 99 99  98 97*  99 98 96*  CO2 23  24   < > 24 25  23 28  28 25 24   GLUCOSE 118*  119*   < > 125* 107*  109* 107*  105* 110* 97  BUN 24*  24*   < > 21* 26*  26* 24*  25* 34* 50*  CREATININE 2.17*  2.26*   < > 1.81* 2.35*  2.24* 2.52*  2.45* 3.38* 4.66*  CALCIUM 9.9  9.9   < > 9.6 9.9  10.1 9.6  9.8 9.9 9.9  MG 2.6*  --   --  2.5* 2.2 2.3 2.4  PHOS 1.7*  1.7*   < > 1.6* 2.4*  2.4* 2.8 3.7 5.2*   < > = values in this interval not displayed.   GFR: Estimated Creatinine Clearance: 24 mL/min (A) (by C-G formula based on SCr of 4.66 mg/dL (H)). Liver Function Tests: Recent Labs  Lab 11/22/19 1628 11/23/19 0416 11/24/19 0545 11/25/19 0112 11/26/19 0544  ALBUMIN 2.6* 2.6* 2.5* 2.8* 2.8*   No results for input(s): LIPASE, AMYLASE in the last 168 hours. No results for input(s): AMMONIA in the last 168 hours. Coagulation Profile: No results for input(s): INR, PROTIME in the last 168 hours. Cardiac Enzymes: No results for input(s): CKTOTAL, CKMB, CKMBINDEX, TROPONINI in the last 168 hours. BNP (last 3 results) No results for input(s): PROBNP in the last 8760 hours. HbA1C: No results for input(s): HGBA1C in the last 72 hours. CBG: Recent Labs  Lab 11/19/19 2000 11/20/19 0004 11/20/19 0354 11/20/19 0727  GLUCAP 111* 108* 119* 109*   Lipid Profile: No results for input(s): CHOL, HDL, LDLCALC, TRIG, CHOLHDL, LDLDIRECT in the last 72 hours. Thyroid Function Tests: No results for input(s): TSH, T4TOTAL, FREET4, T3FREE, THYROIDAB in the last 72 hours. Anemia Panel: No results for input(s): VITAMINB12, FOLATE, FERRITIN, TIBC, IRON, RETICCTPCT in the last 72 hours. Sepsis Labs: No results for input(s): PROCALCITON, LATICACIDVEN in the last 168 hours.  Recent Results (from the past 240 hour(s))  Wound or Superficial Culture     Status: Abnormal   Collection Time: 11/17/19  2:03 PM    Specimen: Wound  Result Value Ref Range Status   Specimen Description   Final    WOUND Performed at [facility], [address], [place], [place] [address]    Special Requests   Final    RL Performed at [facility], [address] [place], [place] [address]    Gram Stain   Final    NO WBC SEEN ABUNDANT GRAM NEGATIVE RODS FEW GRAM POSITIVE COCCI Performed at [facility], [address] [place], [place] [address]    Culture MULTIPLE ORGANISMS PRESENT, NONE PREDOMINANT (A)  Final   Report Status 11/20/2019 FINAL  Final  Respiratory Panel by RT PCR (Flu A&B, Covid) - Nasopharyngeal Swab     Status: None   Collection Time: 11/17/19  2:05 PM   Specimen: Nasopharyngeal Swab  Result Value Ref Range Status   SARS Coronavirus 2 by RT PCR NEGATIVE NEGATIVE Final    Comment: (NOTE) SARS-CoV-2 target nucleic acids are NOT DETECTED.  The SARS-CoV-2 RNA is generally detectable in upper respiratoy specimens during the acute phase of infection. The lowest concentration of SARS-CoV-2 viral copies this assay can detect is 131 copies/mL. A negative result does not preclude SARS-Cov-2 infection and should not be used as the sole basis for treatment or other patient management decisions. A negative result may occur with  improper specimen collection/handling, submission of specimen other than nasopharyngeal swab, presence of viral mutation(s) within the areas targeted by this assay, and inadequate number of viral copies (<131 copies/mL). A negative result must be combined with clinical observations, patient history, and epidemiological information. The expected result is Negative.  Fact Sheet for Patients:  PinkCheek.be  Fact Sheet for Healthcare Providers:  GravelBags.it  This test is no t yet approved or cleared by the [place] FDA and  has been authorized for detection and/or diagnosis of  SARS-CoV-2 by FDA under an Emergency Use Authorization (EUA). This EUA will remain  in effect (meaning this test can be used) for the duration of the COVID-19 declaration under Section 564(b)(1) of the Act, 21 U.S.C. section 360bbb-3(b)(1), unless the authorization is terminated or revoked sooner.     Influenza A by PCR NEGATIVE NEGATIVE Final   Influenza B by PCR NEGATIVE NEGATIVE Final    Comment: (NOTE) The Xpert Xpress SARS-CoV-2/FLU/RSV assay is intended as an aid in  the diagnosis of influenza from Nasopharyngeal swab specimens and  should not be used as a sole basis for treatment. Nasal washings and  aspirates are unacceptable for Xpert Xpress SARS-CoV-2/FLU/RSV  testing.  Fact Sheet for Patients: PinkCheek.be  Fact Sheet for Healthcare Providers: GravelBags.it  This test is not yet approved or cleared by the [place] FDA and  has been authorized for detection and/or diagnosis of SARS-CoV-2 by  FDA under an Emergency Use Authorization (EUA). This EUA will remain  in effect (meaning this test can be used) for the duration of the  Covid-19 declaration under Section 564(b)(1) of the Act, 21  U.S.C. section 360bbb-3(b)(1), unless the authorization is  terminated or revoked. Performed at [facility], [address] [place], [place] [address]   Blood Culture (routine x 2)     Status: None   Collection Time: 11/17/19  2:06 PM   Specimen: BLOOD  Result Value Ref Range Status   Specimen Description BLOOD BLOOD RIGHT HAND  Final   Special Requests   Final    BOTTLES DRAWN AEROBIC AND ANAEROBIC Blood Culture results may not be optimal due to an inadequate volume of blood received in culture bottles   Culture   Final    NO GROWTH 5 DAYS Performed at [facility], [address] [place], [place] [address]    Report Status 11/22/2019 FINAL  Final  Blood Culture (routine x 2)     Status: None    Collection Time: 11/17/19  2:06 PM   Specimen: BLOOD  Result Value Ref Range Status   Specimen Description BLOOD BLOOD RIGHT HAND  Final   Special Requests   Final    BOTTLES DRAWN AEROBIC AND ANAEROBIC Blood Culture adequate volume   Culture   Final    NO GROWTH 5 DAYS Performed at [facility], [address] [place], [place] [address]    Report Status 11/22/2019 FINAL  Final  MRSA PCR Screening     Status: Abnormal   Collection Time: 11/18/19  1:03 AM   Specimen: Nasopharyngeal  Result Value Ref Range Status   MRSA by PCR POSITIVE (A) NEGATIVE Corrected    Comment:        The GeneXpert MRSA Assay (FDA approved for NASAL specimens only), is one component of a comprehensive MRSA colonization surveillance program. It is not intended to diagnose MRSA infection nor to guide or monitor treatment for MRSA infections. RESULT CALLED TO, READ BACK BY AND VERIFIED WITH: CYNTHIA VASQUEZ @0245  ON 11/18/19 SKL Performed at [facility], [address] [place], [place] [address] CORRECTED ON 10/29 AT 0249: PREVIOUSLY REPORTED AS POSITIVE        The GeneXpert MRSA Assay (FDA approved for NASAL specimens only), is one component of a comprehensive MRSA colonization surveillance program. It is not intended to diagnose MRSA infection  nor to guide or monitor treatment for MRSA infections. CYNTHIA VASQUEZ @0245  ON 11/18/19 SKL       Imaging Studies   No results found.   Medications   Scheduled Meds: . aspirin EC  81 mg Oral Daily  . Chlorhexidine Gluconate Cloth  6 each Topical Daily  . collagenase   Topical Daily  . epoetin (EPOGEN/PROCRIT) injection  10,000 Units Intravenous Q M,W,F-HD  . feeding supplement  237 mL Oral TID BM  . ferrous sulfate  325 mg Oral BID  . heparin  5,000 Units Subcutaneous Q8H  . mouth rinse  15 mL Mouth Rinse BID  . midodrine  20 mg Oral TID  . pantoprazole  40 mg Oral BID  . [START ON 11/27/2019] predniSONE  30 mg Oral Q breakfast    Followed by  . [START ON 11/29/2019] predniSONE  20 mg Oral Q breakfast   Followed by  . [START ON 12/01/2019] predniSONE  10 mg Oral Q breakfast  . sodium chloride flush  3 mL Intravenous Q12H   Continuous Infusions: . sodium chloride Stopped (11/23/19 2033)  . sodium chloride Stopped (11/23/19 1105)  .  ceFAZolin (ANCEF) IV 1 g (11/26/19 0846)       LOS: 9 days    Time spent: 25 minutes with > 50% spent in coordination of care and direct patient contact.    Ezekiel Slocumb, DO [facility]  11/26/2019, 4:07 PM    If 7PM-7AM, please contact night-coverage. How to contact the [facility] Attending or Consulting provider [place] or covering provider during after hours [place], for this patient?    1. Check the care team in [facility] and look for a) attending/consulting [facility] provider listed and b) the [facility] team listed 2. Log into [email or web address] and use [place]'s universal password to access. If you do not have the password, please contact the hospital operator. 3. Locate the [facility] provider you are looking for under [facility] and page to a number that you can be directly reached. 4. If you still have difficulty reaching the provider, please page the [facility] (Director on Call) for the Hospitalists listed on amion for assistance.

## 2019-11-26 NOTE — Progress Notes (Addendum)
Patient in today for HD treatment to be completed. V/S taken and recorded. HR noted to be decreased <60 and oxygen saturations are note registering regularly. Patient states the difficult time other staff have been having with his hands being very cold. Oxygen probe placed on the patients right ear with a couple of sats appearing throughout treatment. Patient continues to have oxygen via [place] at 4 LPM. No new orders at this time. Cannulation to Left UA fistula with no issues and AP and VP remained WNL with BFR at 400 during therapy. Treatment completed 20 minutes early per patient request with Dr. Candiss Norse being made aware. No new orders at this time.

## 2019-11-27 DIAGNOSIS — L03115 Cellulitis of right lower limb: Secondary | ICD-10-CM | POA: Diagnosis not present

## 2019-11-27 DIAGNOSIS — N186 End stage renal disease: Secondary | ICD-10-CM | POA: Diagnosis not present

## 2019-11-27 LAB — RENAL FUNCTION PANEL
Albumin: 2.8 g/dL — ABNORMAL LOW (ref 3.5–5.0)
Anion gap: 14 (ref 5–15)
BUN: 41 mg/dL — ABNORMAL HIGH (ref 6–20)
CO2: 22 mmol/L (ref 22–32)
Calcium: 9.4 mg/dL (ref 8.9–10.3)
Chloride: 101 mmol/L (ref 98–111)
Creatinine, Ser: 4.43 mg/dL — ABNORMAL HIGH (ref 0.61–1.24)
GFR, Estimated: 15 mL/min — ABNORMAL LOW (ref >60)
Glucose, Bld: 94 mg/dL (ref 70–99)
Phosphorus: 5.6 mg/dL — ABNORMAL HIGH (ref 2.5–4.6)
Potassium: 5.2 mmol/L — ABNORMAL HIGH (ref 3.5–5.1)
Sodium: 137 mmol/L (ref 135–145)

## 2019-11-27 LAB — CBC
HCT: 31.8 % — ABNORMAL LOW (ref 39.0–52.0)
Hemoglobin: 9.7 g/dL — ABNORMAL LOW (ref 13.0–17.0)
MCH: 29.1 pg (ref 26.0–34.0)
MCHC: 30.5 g/dL (ref 30.0–36.0)
MCV: 95.5 fL (ref 80.0–100.0)
Platelets: 148 10*3/uL — ABNORMAL LOW (ref 150–400)
RBC: 3.33 MIL/uL — ABNORMAL LOW (ref 4.22–5.81)
RDW: 19.8 % — ABNORMAL HIGH (ref 11.5–15.5)
WBC: 9.5 10*3/uL (ref 4.0–10.5)
nRBC: 1 % — ABNORMAL HIGH (ref 0.0–0.2)

## 2019-11-27 LAB — MAGNESIUM: Magnesium: 2.2 mg/dL (ref 1.7–2.4)

## 2019-11-27 MED ORDER — HYDROMORPHONE HCL 1 MG/ML IJ SOLN
0.5000 mg | INTRAMUSCULAR | Status: DC | PRN
  Administered 2019-11-27 – 2019-11-28 (×3): 0.5 mg via INTRAVENOUS
  Filled 2019-11-27 (×3): qty 1

## 2019-11-27 NOTE — Progress Notes (Signed)
[facility]  ROUNDING NOTE   Subjective:    Doing fair today Able to eat without nausea or vomiting although poor appetite Pain in legs is a little better     Objective:  Vital signs in last 24 hours:  Temp:  [97.6 F (36.4 C)-98.5 F (36.9 C)] 98.5 F (36.9 C) (11/07 0805) Pulse Rate:  [39-78] 41 (11/07 0805) Resp:  [12-19] 16 (11/07 0805) BP: (111-143)/(45-63) 114/49 (11/07 0805) SpO2:  [95 %-100 %] 100 % (11/07 0805) Weight:  [97.1 kg] 97.1 kg (11/07 0452)  Weight change: -1.5 kg Filed Weights   11/26/19 0546 11/26/19 0623 11/27/19 0452  Weight: 98.6 kg 100.7 kg 97.1 kg    Intake/Output: I/O last 3 completed shifts: In: 653 [P.O.:600; I.V.:3; IV Piggyback:50] Out: 2632 [Other:2632]   Intake/Output this shift:  Total I/O In: 3 [I.V.:3] Out: -   Physical Exam: General: In no acute distress  Head:  Moist oral mucosal membranes  Eyes: Sclerae and conjunctivae clear  Lungs:  Respirations even unlabored,Lungs clear to auscultation  Heart: S1S2, no rubs or gallops  Abdomen:  Soft, nontender  Extremities:  2+ peripheral edema. ,  Tender to touch  Neurologic: Awake and alert, confused intermittently  Skin:  No new rashes or lesions  Access: Left AVG    Basic Metabolic Panel: Recent Labs  Lab 11/23/19 0416 11/23/19 0416 11/24/19 0545 11/24/19 0545 11/25/19 0112 11/26/19 0544 11/27/19 0632  NA 133*  134*  --  135  137  --  134* 133* 137  K 3.9  3.9  --  3.5  3.5  --  3.7  3.7 5.0 5.2*  CL 99  98  --  97*  99  --  98 96* 101  CO2 25  23  --  28  28  --  25 24 22   GLUCOSE 107*  109*  --  107*  105*  --  110* 97 94  BUN 26*  26*  --  24*  25*  --  34* 50* 41*  CREATININE 2.35*  2.24*  --  2.52*  2.45*  --  3.38* 4.66* 4.43*  CALCIUM 9.9  10.1   < > 9.6  9.8   < > 9.9 9.9 9.4  MG 2.5*  --  2.2  --  2.3 2.4 2.2  PHOS 2.4*  2.4*  --  2.8  --  3.7 5.2* 5.6*   < > = values in this interval not displayed.    Liver Function  Tests: Recent Labs  Lab 11/23/19 0416 11/24/19 0545 11/25/19 0112 11/26/19 0544 11/27/19 3016  ALBUMIN 2.6* 2.5* 2.8* 2.8* 2.8*   No results for input(s): LIPASE, AMYLASE in the last 168 hours. No results for input(s): AMMONIA in the last 168 hours.  CBC: Recent Labs  Lab 11/21/19 0330 11/21/19 0330 11/22/19 0522 11/22/19 0522 11/23/19 0416 11/24/19 0545 11/25/19 0112 11/26/19 0544 11/27/19 0632  WBC 17.0*   < > 12.8*   < > 13.8* 15.9* 16.3* 11.7* 9.5  NEUTROABS 15.2*  --  10.8*  --  11.1*  --   --  9.2*  --   HGB 8.4*   < > 8.3*   < > 8.0* 7.9* 9.0* 9.5* 9.7*  HCT 25.5*   < > 24.9*   < > 25.0* 24.9* 27.6* 29.8* 31.8*  MCV 88.5   < > 87.4   < > 91.9 90.9 90.8 91.7 95.5  PLT 131*   < > 104*   < >  116* 133* 165 158 148*   < > = values in this interval not displayed.    Cardiac Enzymes: No results for input(s): CKTOTAL, CKMB, CKMBINDEX, TROPONINI in the last 168 hours.  BNP: Invalid input(s): POCBNP  CBG: No results for input(s): GLUCAP in the last 168 hours.  Microbiology: Results for orders placed or performed during the hospital encounter of 11/17/19  Wound or Superficial Culture     Status: Abnormal   Collection Time: 11/17/19  2:03 PM   Specimen: Wound  Result Value Ref Range Status   Specimen Description   Final    WOUND Performed at [facility], [address] [place], [place] [address]    Special Requests   Final    RL Performed at [facility], [address] [place], [place] [address]    Gram Stain   Final    NO WBC SEEN ABUNDANT GRAM NEGATIVE RODS FEW GRAM POSITIVE COCCI Performed at [facility], [place] [address] [place], [place] [address]    Culture MULTIPLE ORGANISMS PRESENT, NONE PREDOMINANT (A)  Final   Report Status 11/20/2019 FINAL  Final  Respiratory Panel by RT PCR (Flu A&B, Covid) - Nasopharyngeal Swab     Status: None   Collection Time: 11/17/19  2:05 PM   Specimen: Nasopharyngeal Swab  Result Value Ref  Range Status   SARS Coronavirus 2 by RT PCR NEGATIVE NEGATIVE Final    Comment: (NOTE) SARS-CoV-2 target nucleic acids are NOT DETECTED.  The SARS-CoV-2 RNA is generally detectable in upper respiratoy specimens during the acute phase of infection. The lowest concentration of SARS-CoV-2 viral copies this assay can detect is 131 copies/mL. A negative result does not preclude SARS-Cov-2 infection and should not be used as the sole basis for treatment or other patient management decisions. A negative result may occur with  improper specimen collection/handling, submission of specimen other than nasopharyngeal swab, presence of viral mutation(s) within the areas targeted by this assay, and inadequate number of viral copies (<131 copies/mL). A negative result must be combined with clinical observations, patient history, and epidemiological information. The expected result is Negative.  Fact Sheet for Patients:  PinkCheek.be  Fact Sheet for Healthcare Providers:  GravelBags.it  This test is no t yet approved or cleared by the [place] FDA and  has been authorized for detection and/or diagnosis of SARS-CoV-2 by FDA under an Emergency Use Authorization (EUA). This EUA will remain  in effect (meaning this test can be used) for the duration of the COVID-19 declaration under Section 564(b)(1) of the Act, 21 U.S.C. section 360bbb-3(b)(1), unless the authorization is terminated or revoked sooner.     Influenza A by PCR NEGATIVE NEGATIVE Final   Influenza B by PCR NEGATIVE NEGATIVE Final    Comment: (NOTE) The Xpert Xpress SARS-CoV-2/FLU/RSV assay is intended as an aid in  the diagnosis of influenza from Nasopharyngeal swab specimens and  should not be used as a sole basis for treatment. Nasal washings and  aspirates are unacceptable for Xpert Xpress SARS-CoV-2/FLU/RSV  testing.  Fact Sheet for  Patients: PinkCheek.be  Fact Sheet for Healthcare Providers: GravelBags.it  This test is not yet approved or cleared by the [place] FDA and  has been authorized for detection and/or diagnosis of SARS-CoV-2 by  FDA under an Emergency Use Authorization (EUA). This EUA will remain  in effect (meaning this test can be used) for the duration of the  Covid-19 declaration under Section 564(b)(1) of the Act, 21  U.S.C. section 360bbb-3(b)(1), unless the authorization is  terminated or revoked. Performed at [facility], [address] [place], [place] [address]   Blood Culture (routine x 2)     Status: None   Collection Time: 11/17/19  2:06 PM   Specimen: BLOOD  Result Value Ref Range Status   Specimen Description BLOOD BLOOD RIGHT HAND  Final   Special Requests   Final    BOTTLES DRAWN AEROBIC AND ANAEROBIC Blood Culture results may not be optimal due to an inadequate volume of blood received in culture bottles   Culture   Final    NO GROWTH 5 DAYS Performed at [facility], [address] [place], [place] [address]    Report Status 11/22/2019 FINAL  Final  Blood Culture (routine x 2)     Status: None   Collection Time: 11/17/19  2:06 PM   Specimen: BLOOD  Result Value Ref Range Status   Specimen Description BLOOD BLOOD RIGHT HAND  Final   Special Requests   Final    BOTTLES DRAWN AEROBIC AND ANAEROBIC Blood Culture adequate volume   Culture   Final    NO GROWTH 5 DAYS Performed at [facility], [address] [place], [place] [address]    Report Status 11/22/2019 FINAL  Final  MRSA PCR Screening     Status: Abnormal   Collection Time: 11/18/19  1:03 AM   Specimen: Nasopharyngeal  Result Value Ref Range Status   MRSA by PCR POSITIVE (A) NEGATIVE Corrected    Comment:        The GeneXpert MRSA Assay (FDA approved for NASAL specimens only), is one component of a comprehensive MRSA  colonization surveillance program. It is not intended to diagnose MRSA infection nor to guide or monitor treatment for MRSA infections. RESULT CALLED TO, READ BACK BY AND VERIFIED WITH: CYNTHIA VASQUEZ @0245  ON 11/18/19 SKL Performed at [facility], [address] [place], [place] [address] CORRECTED ON 10/29 AT 0249: PREVIOUSLY REPORTED AS POSITIVE        The GeneXpert MRSA Assay (FDA approved for NASAL specimens only), is one component of a comprehensive MRSA colonization surveillance program. It is not intended to diagnose MRSA infection  nor to guide or monitor treatment for MRSA infections. CYNTHIA VASQUEZ @0245  ON 11/18/19 SKL     Coagulation Studies: No results for input(s): LABPROT, INR in the last 72 hours.  Urinalysis: No results for input(s): COLORURINE, LABSPEC, PHURINE, GLUCOSEU, HGBUR, BILIRUBINUR, KETONESUR, PROTEINUR, UROBILINOGEN, NITRITE, LEUKOCYTESUR in the last 72 hours.  Invalid input(s): APPERANCEUR    Imaging: No results found.   Medications:   . sodium chloride Stopped (11/23/19 2033)  . sodium chloride Stopped (11/23/19 1105)  .  ceFAZolin (ANCEF) IV 1 g (11/27/19 0942)   . aspirin EC  81 mg Oral Daily  . Chlorhexidine Gluconate Cloth  6 each Topical Daily  . collagenase   Topical Daily  . epoetin (EPOGEN/PROCRIT) injection  10,000 Units Intravenous Q M,W,F-HD  . feeding supplement  237 mL Oral TID BM  . ferrous sulfate  325 mg Oral BID  . heparin  5,000 Units Subcutaneous Q8H  . mouth rinse  15 mL Mouth Rinse BID  . midodrine  20 mg Oral TID  . pantoprazole  40 mg Oral BID  . predniSONE  30 mg Oral Q breakfast   Followed by  . [START ON 11/29/2019] predniSONE  20 mg Oral Q breakfast   Followed by  . [START ON 12/01/2019]  predniSONE  10 mg Oral Q breakfast  . sodium chloride flush  3 mL Intravenous Q12H   sodium chloride, acetaminophen **OR** acetaminophen, atropine, dextrose, heparin, heparin sodium (porcine),  HYDROcodone-acetaminophen, HYDROmorphone (DILAUDID) injection, loperamide, melatonin, sodium chloride, sodium chloride flush, traZODone  Assessment/ Plan:  [name] is a 49 y.o. black male with end stage renal disease on hemodialysis, history of kidney transplant, lupus nephritis, and hypotension who was admitted to [facility] on 11/17/2019 for Cellulitis of right lower extremity [I34.742] ESRD on hemodialysis ([place]) [N18.6, Z99.2] Hypotension [I95.9] Severe sepsis ([place]) [A41.9, R65.20]  [facility] [place]/ Left AVG   # End stage renal disease.  CRRT from 10/30-10/31, Then 11/1-11/2  Extra HD on Monday per patient request Then TTS schedule  # Sepsis/hypotension with right lower extremity cellulitis and leukocytosis. Blood cultures with no growth. Leukocytosis improving.  Normotensive today Patient is on Ancef for sepsis  #Anemia with chronic kidney disease.  We will continue monitoring CBCs Lab Results  Component Value Date   HGB 9.7 (L) 11/27/2019     #Secondary Hyperparathyroidism Lab Results  Component Value Date   CALCIUM 9.4 11/27/2019   PHOS 5.6 (H) 11/27/2019  monitor Ca and phos this admission     LOS: [place] 11/7/202112:05 PM

## 2019-11-27 NOTE — Progress Notes (Signed)
PROGRESS NOTE    COLSTON PYLE   WRU:[id number]  DOB: Oct 02, 1970  PCP: Pcp, No    DOA: 11/17/2019 LOS: 10   Brief Narrative   [name] is a 49 y.o. male with medical history significant for end-stage renal disease on hemodialysis on Tuesday, Thursday, Saturday schedule, chronic prednisone therapy in the setting of previous kidney transplant, chronic anemia with baseline hemoglobin 8-9, diverticulitis, GERD, who is admitted on 11/17/2019 with right lower extremity cellulitis after presenting to the ED from home with worsening right lower extremity pain and swelling with purulent discharge, and associated fever/chills, rigors and general malaise.    While holding for a bed in the ED, patient became hypotensive requiring pressors, and ICU level of care for septic shock secondary to right lower extremity cellulitis.       TRH assumed care of patient on 11/23/19.  BP stable off pressors.  Had been receiving CRRT in ICU with plan for return to hemodialysis 11/3.  BP stable after dialysis, patient transferred out of ICU to progressive unit.     Assessment & Plan   Principal Problem:   Cellulitis of right lower extremity Active Problems:   Severe sepsis (HCC)   Lactic acidosis   Hypoglycemia   Anemia   GERD (gastroesophageal reflux disease)   ESRD (end stage renal disease) (HCC)   Paroxysmal atrial fibrillation (HCC)   Septic shock secondary to severe right lower extremity cellulitis - weaned off pressors, transferred out of ICU on 11/3 after hemodialysis and BP stable. Clinically improving, afebrile, leukocytosis has resolved.   --wound culture grew multiple organisms --On Ancef, continue - last day today --Tylenol PRN fevers --Analgesics PRN  Hypotension - on midodrine per nephro  Acute metabolic encephalopathy - likely due to infection and renal failure, improved.  Still confused at times as to situation.  Reports of hallucinations on 11/4.  ESRD - on hemodialysis  MWF.  Nephrology following.  Removal femoral temporary catheter today.  Status post renal transplant - on chronic prednisone.  Has been on stress dose steroids since ICU.   On taper of prednisone down to usual dose of 10 mg daily.  Wide-complex tachycardia - Cardiology following, suspected due to sepsis.   Has not recurred in now several days.   Was on amiodarone which was d/c'd due to bradycardia.  Seen by EP, agreed likely due to sepsis.    Patient has normal LV Systolic function --Telemetry monitoring --Maintain K>4.0 and Mg > 2.0  Paroxysmal A-fib - CHA2DS2-VASc score 0, no anticoagulation indicated and would be higher risk in setting of anemia and thrombocytopenia  Anemia of chronic renal disease - baseline Hbg 8-9.  On EPO.  Monitor CBC.  Hypoglycemic episode - resolved.  Required D10 infusion.  GERD  PPI  Thrombocytopenia - POA, likely due to sepsis.  Resolved.  Monitor CBC.   Patient BMI: Body mass index is 27.48 kg/m.   DVT prophylaxis: heparin injection 5,000 Units Start: 11/17/19 2200   Diet:  (regular because patient refuses renal or low sodium diet)  Diet Orders (From admission, onward)    Start     Ordered   11/26/19 1114  Diet regular Room service appropriate? Yes; Fluid consistency: Thin  Diet effective now       Question Answer Comment  Room service appropriate? Yes   Fluid consistency: Thin      11/26/19 1113            Code Status: Full Code  Subjective 11/27/19    Pt seen at bedside.  Say food is better with regular diet, but still not very good.  Loose stools improved with Imodium.  No fever/chills, nausea/vomiting or other acute complaints today.   Disposition Plan & Communication   Status is: Inpatient  Inpatient status remains appropriate because of need for CIR placement for rehab due to profound weakness.  Continue dialysis and complete IV antibiotics pending placement.  Dispo: The patient is from: Home, lives alone               Anticipated d/c is to: CIR              Anticipated d/c date is: 1-2 days              Patient currently IS medically stable for d/c.    Family Communication: none at bedside, will attempt to call    Consults, Procedures, Significant Events   Consultants:   Nephrology  Cardiology  PCCM  Procedures:   Dialysis, CRRT  Antimicrobials:  Anti-infectives (From admission, onward)   Start     Dose/Rate Route Frequency Ordered Stop   11/25/19 2200  ceFAZolin (ANCEF) IVPB 1 g/50 mL premix        1 g 100 mL/hr over 30 Minutes Intravenous Every 12 hours 11/25/19 1426 11/28/19 0959   11/23/19 1800  ceFAZolin (ANCEF) IVPB 1 g/50 mL premix  Status:  Discontinued        1 g 100 mL/hr over 30 Minutes Intravenous Every 24 hours 11/23/19 1222 11/25/19 1426   11/22/19 2200  ceFAZolin (ANCEF) IVPB 2g/100 mL premix  Status:  Discontinued        2 g 200 mL/hr over 30 Minutes Intravenous Every 12 hours 11/22/19 1136 11/23/19 1222   11/19/19 2000  vancomycin (VANCOCIN) IVPB 1000 mg/200 mL premix  Status:  Discontinued        1,000 mg 200 mL/hr over 60 Minutes Intravenous Every 24 hours 11/19/19 1422 11/21/19 1107   11/19/19 1800  ceFEPIme (MAXIPIME) 2 g in sodium chloride 0.9 % 100 mL IVPB  Status:  Discontinued        2 g 200 mL/hr over 30 Minutes Intravenous Every 12 hours 11/19/19 1422 11/22/19 1135   11/19/19 1200  vancomycin (VANCOCIN) IVPB 1000 mg/200 mL premix  Status:  Discontinued        1,000 mg 200 mL/hr over 60 Minutes Intravenous Every T-Th-Sa (Hemodialysis) 11/17/19 1836 11/18/19 1444   11/18/19 2000  ceFEPIme (MAXIPIME) 2 g in sodium chloride 0.9 % 100 mL IVPB  Status:  Discontinued        2 g 200 mL/hr over 30 Minutes Intravenous Every 24 hours 11/18/19 0154 11/18/19 0858   11/18/19 2000  ceFEPIme (MAXIPIME) 1 g in sodium chloride 0.9 % 100 mL IVPB  Status:  Discontinued        1 g 200 mL/hr over 30 Minutes Intravenous Every 24 hours 11/18/19 0858 11/19/19 1422   11/18/19 1500   cefTRIAXone (ROCEPHIN) 1 g in sodium chloride 0.9 % 100 mL IVPB  Status:  Discontinued        1 g 200 mL/hr over 30 Minutes Intravenous Every 24 hours 11/17/19 1800 11/17/19 2207   11/18/19 1442  vancomycin variable dose per unstable renal function (pharmacist dosing)  Status:  Discontinued         Does not apply See admin instructions 11/18/19 1444 11/19/19 1422   11/17/19 2245  ceFEPIme (MAXIPIME) 2 g in sodium  chloride 0.9 % 100 mL IVPB        2 g 200 mL/hr over 30 Minutes Intravenous  Once 11/17/19 2214 11/18/19 0156   11/17/19 2200  clindamycin (CLEOCIN) IVPB 600 mg  Status:  Discontinued        600 mg 100 mL/hr over 30 Minutes Intravenous Every 8 hours 11/17/19 2011 11/18/19 2013   11/17/19 2000  vancomycin (VANCOREADY) IVPB 750 mg/150 mL  Status:  Discontinued        750 mg 150 mL/hr over 60 Minutes Intravenous Every 24 hours 11/17/19 1824 11/17/19 1832   11/17/19 1545  piperacillin-tazobactam (ZOSYN) IVPB 3.375 g  Status:  Discontinued        3.375 g 100 mL/hr over 30 Minutes Intravenous  Once 11/17/19 1535 11/17/19 1544   11/17/19 1545  clindamycin (CLEOCIN) IVPB 900 mg  Status:  Discontinued        900 mg 100 mL/hr over 30 Minutes Intravenous  Once 11/17/19 1535 11/17/19 1544   11/17/19 1500  vancomycin (VANCOREADY) IVPB 1250 mg/250 mL  Status:  Discontinued        1,250 mg 166.7 mL/hr over 90 Minutes Intravenous  Once 11/17/19 1410 11/17/19 1411   11/17/19 1415  vancomycin (VANCOCIN) IVPB 1000 mg/200 mL premix        1,000 mg 200 mL/hr over 60 Minutes Intravenous  Once 11/17/19 1401 11/17/19 1825   11/17/19 1415  cefTRIAXone (ROCEPHIN) 2 g in sodium chloride 0.9 % 100 mL IVPB        2 g 200 mL/hr over 30 Minutes Intravenous  Once 11/17/19 1401 11/17/19 1558         Objective   Vitals:   11/26/19 2316 11/27/19 0452 11/27/19 0805 11/27/19 1207  BP: (!) 128/53 (!) 135/59 (!) 114/49 138/69  Pulse: 78 (!) 41 (!) 41 (!) 52  Resp: 12 18 16 16   Temp: 98.1 F (36.7 C)  98.3 F (36.8 C) 98.5 F (36.9 C) 97.7 F (36.5 C)  TempSrc: Oral Oral Oral Oral  SpO2: 100% 95% 100%   Weight:  97.1 kg    Height:        Intake/Output Summary (Last 24 hours) at 11/27/2019 1430 Last data filed at 11/27/2019 0947 Gross per 24 hour  Intake 6 ml  Output 2632 ml  Net -2626 ml   Filed Weights   11/26/19 0546 11/26/19 0623 11/27/19 0452  Weight: 98.6 kg 100.7 kg 97.1 kg    Physical Exam:  General exam: awake, alert, no acute distress Respiratory system: CTAB, normal respiratory effort, on room air Cardiovascular system: normal S1/S2, RRR Central nervous system: A&O x3. no gross focal neurologic deficits, normal speech Extremities: improved pitting lower extremity edema, RLE dressing in place   Labs   Data Reviewed: I have personally reviewed following labs and imaging studies  CBC: Recent Labs  Lab 11/21/19 0330 11/21/19 0330 11/22/19 0522 11/22/19 0522 11/23/19 0416 11/24/19 0545 11/25/19 0112 11/26/19 0544 11/27/19 0632  WBC 17.0*   < > 12.8*   < > 13.8* 15.9* 16.3* 11.7* 9.5  NEUTROABS 15.2*  --  10.8*  --  11.1*  --   --  9.2*  --   HGB 8.4*   < > 8.3*   < > 8.0* 7.9* 9.0* 9.5* 9.7*  HCT 25.5*   < > 24.9*   < > 25.0* 24.9* 27.6* 29.8* 31.8*  MCV 88.5   < > 87.4   < > 91.9 90.9 90.8 91.7 95.5  PLT  131*   < > 104*   < > 116* 133* 165 158 148*   < > = values in this interval not displayed.   Basic Metabolic Panel: Recent Labs  Lab 11/23/19 0416 11/24/19 0545 11/25/19 0112 11/26/19 0544 11/27/19 0632  NA 133*  134* 135  137 134* 133* 137  K 3.9  3.9 3.5  3.5 3.7  3.7 5.0 5.2*  CL 99  98 97*  99 98 96* 101  CO2 25  23 28  28 25 24 22   GLUCOSE 107*  109* 107*  105* 110* 97 94  BUN 26*  26* 24*  25* 34* 50* 41*  CREATININE 2.35*  2.24* 2.52*  2.45* 3.38* 4.66* 4.43*  CALCIUM 9.9  10.1 9.6  9.8 9.9 9.9 9.4  MG 2.5* 2.2 2.3 2.4 2.2  PHOS 2.4*  2.4* 2.8 3.7 5.2* 5.6*   GFR: Estimated Creatinine Clearance: 23.2 mL/min (A)  (by C-G formula based on SCr of 4.43 mg/dL (H)). Liver Function Tests: Recent Labs  Lab 11/23/19 0416 11/24/19 0545 11/25/19 0112 11/26/19 0544 11/27/19 7408  ALBUMIN 2.6* 2.5* 2.8* 2.8* 2.8*   No results for input(s): LIPASE, AMYLASE in the last 168 hours. No results for input(s): AMMONIA in the last 168 hours. Coagulation Profile: No results for input(s): INR, PROTIME in the last 168 hours. Cardiac Enzymes: No results for input(s): CKTOTAL, CKMB, CKMBINDEX, TROPONINI in the last 168 hours. BNP (last 3 results) No results for input(s): PROBNP in the last 8760 hours. HbA1C: No results for input(s): HGBA1C in the last 72 hours. CBG: No results for input(s): GLUCAP in the last 168 hours. Lipid Profile: No results for input(s): CHOL, HDL, LDLCALC, TRIG, CHOLHDL, LDLDIRECT in the last 72 hours. Thyroid Function Tests: No results for input(s): TSH, T4TOTAL, FREET4, T3FREE, THYROIDAB in the last 72 hours. Anemia Panel: No results for input(s): VITAMINB12, FOLATE, FERRITIN, TIBC, IRON, RETICCTPCT in the last 72 hours. Sepsis Labs: No results for input(s): PROCALCITON, LATICACIDVEN in the last 168 hours.  Recent Results (from the past 240 hour(s))  MRSA PCR Screening     Status: Abnormal   Collection Time: 11/18/19  1:03 AM   Specimen: Nasopharyngeal  Result Value Ref Range Status   MRSA by PCR POSITIVE (A) NEGATIVE Corrected    Comment:        The GeneXpert MRSA Assay (FDA approved for NASAL specimens only), is one component of a comprehensive MRSA colonization surveillance program. It is not intended to diagnose MRSA infection nor to guide or monitor treatment for MRSA infections. RESULT CALLED TO, READ BACK BY AND VERIFIED WITH: CYNTHIA VASQUEZ @0245  ON 11/18/19 SKL Performed at [facility], [address] [place], [place] [address] CORRECTED ON 10/29 AT 0249: PREVIOUSLY REPORTED AS POSITIVE        The GeneXpert MRSA Assay (FDA approved for NASAL specimens  only), is one component of a comprehensive MRSA colonization surveillance program. It is not intended to diagnose MRSA infection  nor to guide or monitor treatment for MRSA infections. CYNTHIA VASQUEZ @0245  ON 11/18/19 SKL       Imaging Studies   No results found.   Medications   Scheduled Meds: . aspirin EC  81 mg Oral Daily  . Chlorhexidine Gluconate Cloth  6 each Topical Daily  . collagenase   Topical Daily  . epoetin (EPOGEN/PROCRIT) injection  10,000 Units Intravenous Q M,W,F-HD  . feeding supplement  237 mL Oral TID BM  . ferrous sulfate  325 mg  Oral BID  . heparin  5,000 Units Subcutaneous Q8H  . mouth rinse  15 mL Mouth Rinse BID  . midodrine  20 mg Oral TID  . pantoprazole  40 mg Oral BID  . predniSONE  30 mg Oral Q breakfast   Followed by  . [START ON 11/29/2019] predniSONE  20 mg Oral Q breakfast   Followed by  . [START ON 12/01/2019] predniSONE  10 mg Oral Q breakfast  . sodium chloride flush  3 mL Intravenous Q12H   Continuous Infusions: . sodium chloride Stopped (11/23/19 2033)  . sodium chloride Stopped (11/23/19 1105)  .  ceFAZolin (ANCEF) IV 1 g (11/27/19 0942)       LOS: 10 days    Time spent: 20 minutes     Ezekiel Slocumb, DO [facility]  11/27/2019, 2:30 PM    If 7PM-7AM, please contact night-coverage. How to contact the [facility] Attending or Consulting provider [place] or covering provider during after hours [place], for this patient?    1. Check the care team in [facility] and look for a) attending/consulting [facility] provider listed and b) the [facility] team listed 2. Log into [email or web address] and use [place]'s universal password to access. If you do not have the password, please contact the hospital operator. 3. Locate the [facility] provider you are looking for under [facility] and page to a number that you can be directly reached. 4. If you still have difficulty reaching the provider, please page the [facility] (Director on Call) for the Hospitalists  listed on amion for assistance.

## 2019-11-27 NOTE — Progress Notes (Signed)
PT Cancellation Note  Patient Details Name: [name] MRN: [id number] DOB: October 12, 1970   Cancelled Treatment:    Reason Eval/Treat Not Completed: Medical issues which prohibited therapy (Chart reviewed, PT session held. K+: this date 5.2, elevated and uptrending from previous day despite previous day's HD. Will attempt again at later/date time once appropriate.)  11:17 AM, 11/27/19 [name], PT, DPT Physical Therapist - [facility]  [phone] ([place])   [name] 11/27/2019, 11:17 AM

## 2019-11-28 DIAGNOSIS — N186 End stage renal disease: Secondary | ICD-10-CM

## 2019-11-28 LAB — RENAL FUNCTION PANEL
Albumin: 2.9 g/dL — ABNORMAL LOW (ref 3.5–5.0)
Anion gap: 14 (ref 5–15)
BUN: 55 mg/dL — ABNORMAL HIGH (ref 6–20)
CO2: 24 mmol/L (ref 22–32)
Calcium: 9.6 mg/dL (ref 8.9–10.3)
Chloride: 98 mmol/L (ref 98–111)
Creatinine, Ser: 5.51 mg/dL — ABNORMAL HIGH (ref 0.61–1.24)
GFR, Estimated: 12 mL/min — ABNORMAL LOW (ref >60)
Glucose, Bld: 99 mg/dL (ref 70–99)
Phosphorus: 6.2 mg/dL — ABNORMAL HIGH (ref 2.5–4.6)
Potassium: 5 mmol/L (ref 3.5–5.1)
Sodium: 136 mmol/L (ref 135–145)

## 2019-11-28 LAB — CBC
HCT: 31.3 % — ABNORMAL LOW (ref 39.0–52.0)
Hemoglobin: 10.1 g/dL — ABNORMAL LOW (ref 13.0–17.0)
MCH: 29.7 pg (ref 26.0–34.0)
MCHC: 32.3 g/dL (ref 30.0–36.0)
MCV: 92.1 fL (ref 80.0–100.0)
Platelets: 196 10*3/uL (ref 150–400)
RBC: 3.4 MIL/uL — ABNORMAL LOW (ref 4.22–5.81)
RDW: 19.7 % — ABNORMAL HIGH (ref 11.5–15.5)
WBC: 18.5 10*3/uL — ABNORMAL HIGH (ref 4.0–10.5)
nRBC: 1.1 % — ABNORMAL HIGH (ref 0.0–0.2)

## 2019-11-28 LAB — MAGNESIUM: Magnesium: 2.3 mg/dL (ref 1.7–2.4)

## 2019-11-28 MED ORDER — ONDANSETRON HCL 4 MG/2ML IJ SOLN
4.0000 mg | Freq: Four times a day (QID) | INTRAMUSCULAR | Status: DC | PRN
  Administered 2019-11-28 – 2019-12-05 (×7): 4 mg via INTRAVENOUS
  Filled 2019-11-28 (×8): qty 2

## 2019-11-28 MED ORDER — MIDODRINE HCL 5 MG PO TABS
5.0000 mg | ORAL_TABLET | Freq: Three times a day (TID) | ORAL | Status: DC
  Administered 2019-11-28 – 2019-12-03 (×7): 5 mg via ORAL
  Filled 2019-11-28 (×11): qty 1

## 2019-11-28 MED ORDER — HYDROMORPHONE HCL 1 MG/ML IJ SOLN
0.5000 mg | INTRAMUSCULAR | Status: DC | PRN
  Administered 2019-11-28 – 2019-11-30 (×8): 0.5 mg via INTRAVENOUS
  Filled 2019-11-28 (×8): qty 1

## 2019-11-28 MED ORDER — HYDROCODONE-ACETAMINOPHEN 10-325 MG PO TABS
1.0000 | ORAL_TABLET | Freq: Four times a day (QID) | ORAL | Status: DC | PRN
  Administered 2019-11-28 – 2019-12-05 (×21): 1 via ORAL
  Filled 2019-11-28 (×24): qty 1

## 2019-11-28 NOTE — Progress Notes (Signed)
Notified Sharion Settler NP about patient's pain level and heart rate. Patient is bradycardic and requesting Dilaudid. NP adjusted orders of pain medication to be administered. NP is aware of current condition.

## 2019-11-28 NOTE — Plan of Care (Signed)
  Problem: Activity: Goal: Risk for activity intolerance will decrease Outcome: Progressing Note: Patient exercised in the room

## 2019-11-28 NOTE — Progress Notes (Signed)
OT Cancellation Note  Patient Details Name: [name] MRN: [id number] DOB: May 16, 1970   Cancelled Treatment:    Reason Eval/Treat Not Completed: Patient at procedure or test/ unavailable . Pt currently out of room for dialysis treatment. OT will follow up when pt is able to participate.   Darleen Crocker, MS, OTR/L , CBIS ascom [phone]  11/28/19, 12:41 PM   11/28/2019, 12:41 PM

## 2019-11-28 NOTE — Progress Notes (Signed)
[facility]  ROUNDING NOTE   Subjective:    Patient seen in dialysis, receiving dialysis treatment, tolerating well.    HEMODIALYSIS FLOWSHEET:  Blood Flow Rate (mL/min): 400 mL/min Arterial Pressure (mmHg): -180 mmHg Venous Pressure (mmHg): 170 mmHg Transmembrane Pressure (mmHg): 60 mmHg Ultrafiltration Rate (mL/min): 830 mL/min Dialysate Flow Rate (mL/min): 600 ml/min Conductivity: Machine : 13.8 Conductivity: Machine : 13.8 Dialysis Fluid Bolus: Normal Saline Bolus Amount (mL): 300 mL      Objective:  Vital signs in last 24 hours:  Temp:  [98 F (36.7 C)-98.7 F (37.1 C)] 98.5 F (36.9 C) (11/08 1000) Pulse Rate:  [56-80] 62 (11/08 1000) Resp:  [14-19] 17 (11/08 0817) BP: (126-157)/(50-64) 132/60 (11/08 1000) SpO2:  [96 %-100 %] 99 % (11/08 1000) Weight:  [97.9 kg] 97.9 kg (11/08 0500)  Weight change: 0.786 kg Filed Weights   11/26/19 0623 11/27/19 0452 11/28/19 0500  Weight: 100.7 kg 97.1 kg 97.9 kg    Intake/Output: I/O last 3 completed shifts: In: 106 [I.V.:6; IV Piggyback:100] Out: -    Intake/Output this shift:  Total I/O In: 3 [I.V.:3] Out: 0   Physical Exam: General: Resting in bed, receiving dialysis  Head: Normocephalic,atraumatic  Eyes: Anicteric  Lungs:  Lungs clear to auscultation  Heart: Irregular,HR in 50's  Abdomen:  Soft, non tender,non distended  Extremities:  2+ peripheral edema. ,  Tender to touch  Neurologic: Speech clear, forgetful  Skin:  No new rashes or lesions, Rt leg with dressing clean,dry and intact  Access: Left AVG    Basic Metabolic Panel: Recent Labs  Lab 11/24/19 0545 11/24/19 0545 11/25/19 0112 11/25/19 0112 11/26/19 0544 11/27/19 0632 11/28/19 1037  NA 135  137  --  134*  --  133* 137 136  K 3.5  3.5  --  3.7  3.7  --  5.0 5.2* 5.0  CL 97*  99  --  98  --  96* 101 98  CO2 28  28  --  25  --  24 22 24   GLUCOSE 107*  105*  --  110*  --  97 94 99  BUN 24*  25*  --  34*  --  50* 41*  55*  CREATININE 2.52*  2.45*  --  3.38*  --  4.66* 4.43* 5.51*  CALCIUM 9.6  9.8   < > 9.9   < > 9.9 9.4 9.6  MG 2.2  --  2.3  --  2.4 2.2 2.3  PHOS 2.8  --  3.7  --  5.2* 5.6* 6.2*   < > = values in this interval not displayed.    Liver Function Tests: Recent Labs  Lab 11/24/19 0545 11/25/19 0112 11/26/19 0544 11/27/19 0632 11/28/19 1037  ALBUMIN 2.5* 2.8* 2.8* 2.8* 2.9*   No results for input(s): LIPASE, AMYLASE in the last 168 hours. No results for input(s): AMMONIA in the last 168 hours.  CBC: Recent Labs  Lab 11/22/19 0522 11/22/19 0522 11/23/19 0416 11/23/19 0416 11/24/19 0545 11/25/19 0112 11/26/19 0544 11/27/19 0632 11/28/19 1037  WBC 12.8*   < > 13.8*   < > 15.9* 16.3* 11.7* 9.5 18.5*  NEUTROABS 10.8*  --  11.1*  --   --   --  9.2*  --   --   HGB 8.3*   < > 8.0*   < > 7.9* 9.0* 9.5* 9.7* 10.1*  HCT 24.9*   < > 25.0*   < > 24.9* 27.6* 29.8* 31.8* 31.3*  MCV  87.4   < > 91.9   < > 90.9 90.8 91.7 95.5 92.1  PLT 104*   < > 116*   < > 133* 165 158 148* 196   < > = values in this interval not displayed.    Cardiac Enzymes: No results for input(s): CKTOTAL, CKMB, CKMBINDEX, TROPONINI in the last 168 hours.  BNP: Invalid input(s): POCBNP  CBG: No results for input(s): GLUCAP in the last 168 hours.  Microbiology: Results for orders placed or performed during the hospital encounter of 11/17/19  Wound or Superficial Culture     Status: Abnormal   Collection Time: 11/17/19  2:03 PM   Specimen: Wound  Result Value Ref Range Status   Specimen Description   Final    WOUND Performed at [facility], [address], [place], [place] [address]    Special Requests   Final    RL Performed at [facility], [address] [place], [place] [address]    Gram Stain   Final    NO WBC SEEN ABUNDANT GRAM NEGATIVE RODS FEW GRAM POSITIVE COCCI Performed at [facility], [place] [address], [place], [place] [address]    Culture MULTIPLE  ORGANISMS PRESENT, NONE PREDOMINANT (A)  Final   Report Status 11/20/2019 FINAL  Final  Respiratory Panel by RT PCR (Flu A&B, Covid) - Nasopharyngeal Swab     Status: None   Collection Time: 11/17/19  2:05 PM   Specimen: Nasopharyngeal Swab  Result Value Ref Range Status   SARS Coronavirus 2 by RT PCR NEGATIVE NEGATIVE Final    Comment: (NOTE) SARS-CoV-2 target nucleic acids are NOT DETECTED.  The SARS-CoV-2 RNA is generally detectable in upper respiratoy specimens during the acute phase of infection. The lowest concentration of SARS-CoV-2 viral copies this assay can detect is 131 copies/mL. A negative result does not preclude SARS-Cov-2 infection and should not be used as the sole basis for treatment or other patient management decisions. A negative result may occur with  improper specimen collection/handling, submission of specimen other than nasopharyngeal swab, presence of viral mutation(s) within the areas targeted by this assay, and inadequate number of viral copies (<131 copies/mL). A negative result must be combined with clinical observations, patient history, and epidemiological information. The expected result is Negative.  Fact Sheet for Patients:  PinkCheek.be  Fact Sheet for Healthcare Providers:  GravelBags.it  This test is no t yet approved or cleared by the [place] FDA and  has been authorized for detection and/or diagnosis of SARS-CoV-2 by FDA under an Emergency Use Authorization (EUA). This EUA will remain  in effect (meaning this test can be used) for the duration of the COVID-19 declaration under Section 564(b)(1) of the Act, 21 U.S.C. section 360bbb-3(b)(1), unless the authorization is terminated or revoked sooner.     Influenza A by PCR NEGATIVE NEGATIVE Final   Influenza B by PCR NEGATIVE NEGATIVE Final    Comment: (NOTE) The Xpert Xpress SARS-CoV-2/FLU/RSV assay is intended as an aid in   the diagnosis of influenza from Nasopharyngeal swab specimens and  should not be used as a sole basis for treatment. Nasal washings and  aspirates are unacceptable for Xpert Xpress SARS-CoV-2/FLU/RSV  testing.  Fact Sheet for Patients: PinkCheek.be  Fact Sheet for Healthcare Providers: GravelBags.it  This test is not yet approved or cleared by the [place] FDA and  has been authorized for detection and/or diagnosis of SARS-CoV-2 by  FDA under an Emergency Use Authorization (EUA). This  EUA will remain  in effect (meaning this test can be used) for the duration of the  Covid-19 declaration under Section 564(b)(1) of the Act, 21  U.S.C. section 360bbb-3(b)(1), unless the authorization is  terminated or revoked. Performed at [facility], [address] [place], [place] [address]   Blood Culture (routine x 2)     Status: None   Collection Time: 11/17/19  2:06 PM   Specimen: BLOOD  Result Value Ref Range Status   Specimen Description BLOOD BLOOD RIGHT HAND  Final   Special Requests   Final    BOTTLES DRAWN AEROBIC AND ANAEROBIC Blood Culture results may not be optimal due to an inadequate volume of blood received in culture bottles   Culture   Final    NO GROWTH 5 DAYS Performed at [facility], [address] [place], [place] [address]    Report Status 11/22/2019 FINAL  Final  Blood Culture (routine x 2)     Status: None   Collection Time: 11/17/19  2:06 PM   Specimen: BLOOD  Result Value Ref Range Status   Specimen Description BLOOD BLOOD RIGHT HAND  Final   Special Requests   Final    BOTTLES DRAWN AEROBIC AND ANAEROBIC Blood Culture adequate volume   Culture   Final    NO GROWTH 5 DAYS Performed at [facility], [address] [place], [place] [address]    Report Status 11/22/2019 FINAL  Final  MRSA PCR Screening     Status: Abnormal   Collection Time: 11/18/19  1:03 AM    Specimen: Nasopharyngeal  Result Value Ref Range Status   MRSA by PCR POSITIVE (A) NEGATIVE Corrected    Comment:        The GeneXpert MRSA Assay (FDA approved for NASAL specimens only), is one component of a comprehensive MRSA colonization surveillance program. It is not intended to diagnose MRSA infection nor to guide or monitor treatment for MRSA infections. RESULT CALLED TO, READ BACK BY AND VERIFIED WITH: CYNTHIA VASQUEZ @0245  ON 11/18/19 SKL Performed at [facility], [address] [place],  [address] CORRECTED ON 10/29 AT 0249: PREVIOUSLY REPORTED AS POSITIVE        The GeneXpert MRSA Assay (FDA approved for NASAL specimens only), is one component of a comprehensive MRSA colonization surveillance program. It is not intended to diagnose MRSA infection  nor to guide or monitor treatment for MRSA infections. CYNTHIA VASQUEZ @0245  ON 11/18/19 SKL     Coagulation Studies: No results for input(s): LABPROT, INR in the last 72 hours.  Urinalysis: No results for input(s): COLORURINE, LABSPEC, PHURINE, GLUCOSEU, HGBUR, BILIRUBINUR, KETONESUR, PROTEINUR, UROBILINOGEN, NITRITE, LEUKOCYTESUR in the last 72 hours.  Invalid input(s): APPERANCEUR    Imaging: No results found.   Medications:   . sodium chloride 250 mL (11/27/19 2105)  . sodium chloride Stopped (11/23/19 1105)   . aspirin EC  81 mg Oral Daily  . Chlorhexidine Gluconate Cloth  6 each Topical Daily  . collagenase   Topical Daily  . epoetin (EPOGEN/PROCRIT) injection  10,000 Units Intravenous Q M,W,F-HD  . feeding supplement  237 mL Oral TID BM  . ferrous sulfate  325 mg Oral BID  . heparin  5,000 Units Subcutaneous Q8H  . mouth rinse  15 mL Mouth Rinse BID  . midodrine  20 mg Oral TID  . pantoprazole  40 mg Oral BID  . [START ON 11/29/2019] predniSONE  20 mg Oral Q breakfast   Followed  by  . Derrill Memo ON 12/01/2019] predniSONE  10 mg Oral Q breakfast  . sodium chloride flush  3 mL Intravenous  Q12H   sodium chloride, acetaminophen **OR** acetaminophen, atropine, dextrose, heparin, heparin sodium (porcine), HYDROcodone-acetaminophen, HYDROmorphone (DILAUDID) injection, loperamide, melatonin, ondansetron (ZOFRAN) IV, sodium chloride, sodium chloride flush, traZODone  Assessment/ Plan:  Mr. AZAN MANERI is a 49 y.o. black male with end stage renal disease on hemodialysis, history of kidney transplant, lupus nephritis, and hypotension who was admitted to [facility] on 11/17/2019 for Cellulitis of right lower extremity [X72.620] ESRD on hemodialysis ([place]) [N18.6, Z99.2] Hypotension [I95.9] Severe sepsis ([place]) [A41.9, R65.20]  [facility]/ [place]/ Left AVG   # End stage renal disease.  CRRT from 10/30-10/31, Then 11/1-11/2  Patient is requesting for extra dialysis today Receiving a short session of dialysis treatment Tolerating well  # Sepsis/hypotension with right lower extremity cellulitis and leukocytosis. Blood cultures with no growth. Leukocytosis improving.  Patient is on Cefazolin  BP 135/60 in dialysis He is on Midodrine #Anemia with chronic kidney disease.   Lab Results  Component Value Date   HGB 10.1 (L) 11/28/2019   Continue Epogen with dialysis TTS  #Secondary Hyperparathyroidism Lab Results  Component Value Date   CALCIUM 9.6 11/28/2019   PHOS 6.2 (H) 11/28/2019   Will continue monitoring bone mineral metabolism parameters    LOS: 11 Asiah Befort 11/8/202112:27 PM

## 2019-11-28 NOTE — Progress Notes (Signed)
PROGRESS NOTE    [name]   GYB:[id number]  DOB: [birth year]  PCP: Pcp, No    DOA: 11/17/2019 LOS: 11   Brief Narrative   [name] is a 49 y.o. male with medical history significant for end-stage renal disease on hemodialysis on Tuesday, Thursday, Saturday schedule, chronic prednisone therapy in the setting of previous kidney transplant, chronic anemia with baseline hemoglobin 8-9, diverticulitis, GERD, who is admitted on 11/17/2019 with right lower extremity cellulitis after presenting to the ED from home with worsening right lower extremity pain and swelling with purulent discharge, and associated fever/chills, rigors and general malaise.    While holding for a bed in the ED, patient became hypotensive requiring pressors, and ICU level of care for septic shock secondary to right lower extremity cellulitis.       TRH assumed care of patient on 11/23/19.  BP stable off pressors.  Had been receiving CRRT in ICU with plan for return to hemodialysis 11/3.  BP stable after dialysis, patient transferred out of ICU to progressive unit.     Assessment & Plan   Principal Problem:   Cellulitis of right lower extremity Active Problems:   Severe sepsis (HCC)   Lactic acidosis   Hypoglycemia   Anemia   GERD (gastroesophageal reflux disease)   ESRD (end stage renal disease) (HCC)   Paroxysmal atrial fibrillation (HCC)   Septic shock secondary to severe right lower extremity cellulitis - weaned off pressors, transferred out of ICU on 11/3 after hemodialysis and BP stable. Clinically improving, afebrile, leukocytosis has resolved.   --wound culture grew multiple organisms --Completed Ancef --Tylenol PRN fevers --Analgesics PRN  Hypotension - on midodrine per nephro  Acute metabolic encephalopathy - likely due to infection and renal failure, improved.  Still confused at times as to situation.  Reports of hallucinations on 11/4.  ESRD - on hemodialysis MWF.  Nephrology  following.  Removal femoral temporary catheter today.  Status post renal transplant - on chronic prednisone.  On taper of prednisone down to usual dose of 10 mg daily (after on stress dose steroids in ICU).  Wide-complex tachycardia - Cardiology following, suspected due to sepsis.   Has not recurred in now several days.   Was on amiodarone which was d/c'd due to bradycardia.  Seen by EP, agreed likely due to sepsis.    Patient has normal LV Systolic function --Telemetry monitoring --Maintain K>4.0 and Mg > 2.0  Paroxysmal A-fib - CHA2DS2-VASc score 0, no anticoagulation indicated and would be higher risk in setting of anemia and thrombocytopenia  Anemia of chronic renal disease - baseline Hbg 8-9.  On EPO.  Monitor CBC.  Hypoglycemic episode - resolved.  Required D10 infusion.  GERD  PPI  Thrombocytopenia - POA, likely due to sepsis.  Resolved.  Monitor CBC.   Patient BMI: Body mass index is 27.71 kg/m.   DVT prophylaxis: heparin injection 5,000 Units Start: 11/17/19 2200   Diet:  (regular because patient refuses renal or low sodium diet)  Diet Orders (From admission, onward)    Start     Ordered   11/26/19 1114  Diet regular Room service appropriate? Yes; Fluid consistency: Thin  Diet effective now       Question Answer Comment  Room service appropriate? Yes   Fluid consistency: Thin      11/26/19 1113            Code Status: Full Code    Subjective 11/28/19    Pt seen sleeping  in dialysis today.  No acute events reported.  Discussed weaning oxygen with RN.  Pt without pain currently.  No other complaints.   Disposition Plan & Communication   Status is: Inpatient  Inpatient status remains appropriate because of need for CIR placement for rehab due to profound weakness.  Continue dialysis and complete IV antibiotics pending placement.  Dispo: The patient is from: Home, lives alone              Anticipated d/c is to: CIR              Anticipated d/c date is:  1-2 days              Patient currently IS medically stable for d/c.    Family Communication: none at bedside, will attempt to call    Consults, Procedures, Significant Events   Consultants:   Nephrology  Cardiology  PCCM  Procedures:   Dialysis, CRRT  Antimicrobials:  Anti-infectives (From admission, onward)   Start     Dose/Rate Route Frequency Ordered Stop   11/25/19 2200  ceFAZolin (ANCEF) IVPB 1 g/50 mL premix        1 g 100 mL/hr over 30 Minutes Intravenous Every 12 hours 11/25/19 1426 11/27/19 2137   11/23/19 1800  ceFAZolin (ANCEF) IVPB 1 g/50 mL premix  Status:  Discontinued        1 g 100 mL/hr over 30 Minutes Intravenous Every 24 hours 11/23/19 1222 11/25/19 1426   11/22/19 2200  ceFAZolin (ANCEF) IVPB 2g/100 mL premix  Status:  Discontinued        2 g 200 mL/hr over 30 Minutes Intravenous Every 12 hours 11/22/19 1136 11/23/19 1222   11/19/19 2000  vancomycin (VANCOCIN) IVPB 1000 mg/200 mL premix  Status:  Discontinued        1,000 mg 200 mL/hr over 60 Minutes Intravenous Every 24 hours 11/19/19 1422 11/21/19 1107   11/19/19 1800  ceFEPIme (MAXIPIME) 2 g in sodium chloride 0.9 % 100 mL IVPB  Status:  Discontinued        2 g 200 mL/hr over 30 Minutes Intravenous Every 12 hours 11/19/19 1422 11/22/19 1135   11/19/19 1200  vancomycin (VANCOCIN) IVPB 1000 mg/200 mL premix  Status:  Discontinued        1,000 mg 200 mL/hr over 60 Minutes Intravenous Every T-Th-Sa (Hemodialysis) 11/17/19 1836 11/18/19 1444   11/18/19 2000  ceFEPIme (MAXIPIME) 2 g in sodium chloride 0.9 % 100 mL IVPB  Status:  Discontinued        2 g 200 mL/hr over 30 Minutes Intravenous Every 24 hours 11/18/19 0154 11/18/19 0858   11/18/19 2000  ceFEPIme (MAXIPIME) 1 g in sodium chloride 0.9 % 100 mL IVPB  Status:  Discontinued        1 g 200 mL/hr over 30 Minutes Intravenous Every 24 hours 11/18/19 0858 11/19/19 1422   11/18/19 1500  cefTRIAXone (ROCEPHIN) 1 g in sodium chloride 0.9 % 100 mL IVPB   Status:  Discontinued        1 g 200 mL/hr over 30 Minutes Intravenous Every 24 hours 11/17/19 1800 11/17/19 2207   11/18/19 1442  vancomycin variable dose per unstable renal function (pharmacist dosing)  Status:  Discontinued         Does not apply See admin instructions 11/18/19 1444 11/19/19 1422   11/17/19 2245  ceFEPIme (MAXIPIME) 2 g in sodium chloride 0.9 % 100 mL IVPB        2  g 200 mL/hr over 30 Minutes Intravenous  Once 11/17/19 2214 11/18/19 0156   11/17/19 2200  clindamycin (CLEOCIN) IVPB 600 mg  Status:  Discontinued        600 mg 100 mL/hr over 30 Minutes Intravenous Every 8 hours 11/17/19 2011 11/18/19 2013   11/17/19 2000  vancomycin (VANCOREADY) IVPB 750 mg/150 mL  Status:  Discontinued        750 mg 150 mL/hr over 60 Minutes Intravenous Every 24 hours 11/17/19 1824 11/17/19 1832   11/17/19 1545  piperacillin-tazobactam (ZOSYN) IVPB 3.375 g  Status:  Discontinued        3.375 g 100 mL/hr over 30 Minutes Intravenous  Once 11/17/19 1535 11/17/19 1544   11/17/19 1545  clindamycin (CLEOCIN) IVPB 900 mg  Status:  Discontinued        900 mg 100 mL/hr over 30 Minutes Intravenous  Once 11/17/19 1535 11/17/19 1544   11/17/19 1500  vancomycin (VANCOREADY) IVPB 1250 mg/250 mL  Status:  Discontinued        1,250 mg 166.7 mL/hr over 90 Minutes Intravenous  Once 11/17/19 1410 11/17/19 1411   11/17/19 1415  vancomycin (VANCOCIN) IVPB 1000 mg/200 mL premix        1,000 mg 200 mL/hr over 60 Minutes Intravenous  Once 11/17/19 1401 11/17/19 1825   11/17/19 1415  cefTRIAXone (ROCEPHIN) 2 g in sodium chloride 0.9 % 100 mL IVPB        2 g 200 mL/hr over 30 Minutes Intravenous  Once 11/17/19 1401 11/17/19 1558         Objective   Vitals:   11/28/19 0817 11/28/19 1000 11/28/19 1501 11/28/19 2026  BP: (!) 145/64 132/60 (!) 154/69 125/76  Pulse: 63 62 (!) 57 61  Resp: 17  17 18   Temp: 98.6 F (37 C) 98.5 F (36.9 C) 98.1 F (36.7 C) 98.4 F (36.9 C)  TempSrc: Oral Oral Oral Oral   SpO2: 100% 99% 100% 100%  Weight:      Height:        Intake/Output Summary (Last 24 hours) at 11/28/2019 2036 Last data filed at 11/28/2019 1810 Gross per 24 hour  Intake 823 ml  Output 2000 ml  Net -1177 ml   Filed Weights   11/26/19 0623 11/27/19 0452 11/28/19 0500  Weight: 100.7 kg 97.1 kg 97.9 kg    Physical Exam:  General exam: sleeping comfortably in dialysis chair, arouses easily, no acute distress Respiratory system: CTAB, normal respiratory effort Cardiovascular system: normal S1/S2, RRR Extremities: lower extremity edema improved, RLE dressing in place   Labs   Data Reviewed: I have personally reviewed following labs and imaging studies  CBC: Recent Labs  Lab 11/22/19 0522 11/22/19 0522 11/23/19 0416 11/23/19 0416 11/24/19 0545 11/25/19 0112 11/26/19 0544 11/27/19 0632 11/28/19 1037  WBC 12.8*   < > 13.8*   < > 15.9* 16.3* 11.7* 9.5 18.5*  NEUTROABS 10.8*  --  11.1*  --   --   --  9.2*  --   --   HGB 8.3*   < > 8.0*   < > 7.9* 9.0* 9.5* 9.7* 10.1*  HCT 24.9*   < > 25.0*   < > 24.9* 27.6* 29.8* 31.8* 31.3*  MCV 87.4   < > 91.9   < > 90.9 90.8 91.7 95.5 92.1  PLT 104*   < > 116*   < > 133* 165 158 148* 196   < > = values in this interval not displayed.  Basic Metabolic Panel: Recent Labs  Lab 11/24/19 0545 11/25/19 0112 11/26/19 0544 11/27/19 0632 11/28/19 1037  NA 135  137 134* 133* 137 136  K 3.5  3.5 3.7  3.7 5.0 5.2* 5.0  CL 97*  99 98 96* 101 98  CO2 28  28 25 24 22 24   GLUCOSE 107*  105* 110* 97 94 99  BUN 24*  25* 34* 50* 41* 55*  CREATININE 2.52*  2.45* 3.38* 4.66* 4.43* 5.51*  CALCIUM 9.6  9.8 9.9 9.9 9.4 9.6  MG 2.2 2.3 2.4 2.2 2.3  PHOS 2.8 3.7 5.2* 5.6* 6.2*   GFR: Estimated Creatinine Clearance: 18.6 mL/min (A) (by C-G formula based on SCr of 5.51 mg/dL (H)). Liver Function Tests: Recent Labs  Lab 11/24/19 0545 11/25/19 0112 11/26/19 0544 11/27/19 0632 11/28/19 1037  ALBUMIN 2.5* 2.8* 2.8* 2.8* 2.9*   No  results for input(s): LIPASE, AMYLASE in the last 168 hours. No results for input(s): AMMONIA in the last 168 hours. Coagulation Profile: No results for input(s): INR, PROTIME in the last 168 hours. Cardiac Enzymes: No results for input(s): CKTOTAL, CKMB, CKMBINDEX, TROPONINI in the last 168 hours. BNP (last 3 results) No results for input(s): PROBNP in the last 8760 hours. HbA1C: No results for input(s): HGBA1C in the last 72 hours. CBG: No results for input(s): GLUCAP in the last 168 hours. Lipid Profile: No results for input(s): CHOL, HDL, LDLCALC, TRIG, CHOLHDL, LDLDIRECT in the last 72 hours. Thyroid Function Tests: No results for input(s): TSH, T4TOTAL, FREET4, T3FREE, THYROIDAB in the last 72 hours. Anemia Panel: No results for input(s): VITAMINB12, FOLATE, FERRITIN, TIBC, IRON, RETICCTPCT in the last 72 hours. Sepsis Labs: No results for input(s): PROCALCITON, LATICACIDVEN in the last 168 hours.  No results found for this or any previous visit (from the past 240 hour(s)).    Imaging Studies   No results found.   Medications   Scheduled Meds: . aspirin EC  81 mg Oral Daily  . Chlorhexidine Gluconate Cloth  6 each Topical Daily  . collagenase   Topical Daily  . epoetin (EPOGEN/PROCRIT) injection  10,000 Units Intravenous Q M,W,F-HD  . feeding supplement  237 mL Oral TID BM  . ferrous sulfate  325 mg Oral BID  . heparin  5,000 Units Subcutaneous Q8H  . mouth rinse  15 mL Mouth Rinse BID  . midodrine  5 mg Oral TID  . pantoprazole  40 mg Oral BID  . [START ON 11/29/2019] predniSONE  20 mg Oral Q breakfast   Followed by  . [START ON 12/01/2019] predniSONE  10 mg Oral Q breakfast  . sodium chloride flush  3 mL Intravenous Q12H   Continuous Infusions: . sodium chloride 250 mL (11/27/19 2105)  . sodium chloride Stopped (11/23/19 1105)       LOS: 11 days    Time spent: 20 minutes     Ezekiel Slocumb, DO [facility]  11/28/2019, 8:36 PM    If  7PM-7AM, please contact night-coverage. How to contact the [facility] Attending or Consulting provider [place] or covering provider during after hours [place], for this patient?    1. Check the care team in [facility] and look for a) attending/consulting [facility] provider listed and b) the [facility] team listed 2. Log into [email or web address] and use [place]'s universal password to access. If you do not have the password, please contact the hospital operator. 3. Locate the [facility] provider you are looking for under [facility] and  page to a number that you can be directly reached. 4. If you still have difficulty reaching the provider, please page the [facility] (Director on Call) for the Hospitalists listed on amion for assistance.

## 2019-11-28 NOTE — TOC Progression Note (Signed)
Transition of Care [facility]) - Progression Note    Patient Details  Name: [name] MRN: [id number] Date of Birth: [birth year]  Transition of Care [facility]) CM/SW [place], RN Phone Number: 11/28/2019, 4:11 PM  Clinical Narrative:     Damaris Schooner with Tally Due at [facility]  # [phone] Fax# [phone].   Faxed information.  They have bed availability and work with the [place].    Expected Discharge Plan: [place] Barriers to Discharge: Continued Medical Work up  Expected Discharge Plan and Services Expected Discharge Plan: [place]   Discharge Planning Services: CM Consult Post Acute Care Choice: [place], [place] Living arrangements for the past 2 months: Single Family Home                                       Social Determinants of Health (SDOH) Interventions    Readmission Risk Interventions Readmission Risk Prevention Plan 11/24/2019  Transportation Screening Complete  PCP or Specialist Appt within 3-5 Days Complete  HRI or [place] Complete  Social Work Consult for [place] Planning/Counseling Complete  Palliative Care Screening Not Applicable  Medication Review Press photographer) Referral to Pharmacy  Some recent data might be hidden

## 2019-11-28 NOTE — Progress Notes (Signed)
PT Cancellation Note  Patient Details Name: [name] MRN: [id number] DOB: 31-Dec-1970   Cancelled Treatment:    Reason Eval/Treat Not Completed: Patient at procedure or test/unavailable   Pt at dialysis this pm.  Will continue as appropriate.   Chesley Noon 11/28/2019, 3:11 PM

## 2019-11-28 NOTE — Progress Notes (Signed)
Pt agitated and very demanding.  Pain med given per pt request.  Pt stated that the social worker does not have a rehab for him to go to by 3pm tomorrow ( 11/29/19)  He will be leaving AMA.

## 2019-11-28 NOTE — Progress Notes (Signed)
Patient performed leg exercises in the room with walker assisted by nurse tech. Patient performed 60 reps X2 of knee lifts. Patient complained of right leg pain. Patient has a steady gait. Minimal assistance from staff was required.

## 2019-11-29 ENCOUNTER — Inpatient Hospital Stay: Payer: No Typology Code available for payment source

## 2019-11-29 DIAGNOSIS — D72829 Elevated white blood cell count, unspecified: Secondary | ICD-10-CM

## 2019-11-29 DIAGNOSIS — L03115 Cellulitis of right lower limb: Secondary | ICD-10-CM | POA: Diagnosis not present

## 2019-11-29 DIAGNOSIS — N186 End stage renal disease: Secondary | ICD-10-CM

## 2019-11-29 LAB — PROCALCITONIN: Procalcitonin: 2.52 ng/mL

## 2019-11-29 LAB — RENAL FUNCTION PANEL
Albumin: 2.7 g/dL — ABNORMAL LOW (ref 3.5–5.0)
Anion gap: 12 (ref 5–15)
BUN: 26 mg/dL — ABNORMAL HIGH (ref 6–20)
CO2: 27 mmol/L (ref 22–32)
Calcium: 8.9 mg/dL (ref 8.9–10.3)
Chloride: 99 mmol/L (ref 98–111)
Creatinine, Ser: 3.38 mg/dL — ABNORMAL HIGH (ref 0.61–1.24)
GFR, Estimated: 21 mL/min — ABNORMAL LOW (ref >60)
Glucose, Bld: 99 mg/dL (ref 70–99)
Phosphorus: 2.8 mg/dL (ref 2.5–4.6)
Potassium: 3.5 mmol/L (ref 3.5–5.1)
Sodium: 138 mmol/L (ref 135–145)

## 2019-11-29 LAB — RESP PANEL BY RT PCR (RSV, FLU A&B, COVID)
Influenza A by PCR: NEGATIVE
Influenza B by PCR: NEGATIVE
Respiratory Syncytial Virus by PCR: NEGATIVE
SARS Coronavirus 2 by RT PCR: NEGATIVE

## 2019-11-29 LAB — MAGNESIUM: Magnesium: 1.8 mg/dL (ref 1.7–2.4)

## 2019-11-29 NOTE — TOC Progression Note (Addendum)
Transition of Care [facility]) - Progression Note    Patient Details  Name: [name] MRN: [id number] Date of Birth: 1970-03-16  Transition of Care [facility]) CM/SW [place], RN Phone Number: 11/29/2019, 12:47 PM  Clinical Narrative:     Faxed PT clinical note from today to Tally Due at [facility].     UPdate:  Received call from [place] sent to [facility] for authorization, request admit date on 11/30/19.  Expected Discharge Plan: [place] Barriers to Discharge: Continued Medical Work up  Expected Discharge Plan and Services Expected Discharge Plan: [place]   Discharge Planning Services: CM Consult Post Acute Care Choice: [place], [place] Living arrangements for the past 2 months: Single Family Home                                       Social Determinants of Health (SDOH) Interventions    Readmission Risk Interventions Readmission Risk Prevention Plan 11/24/2019  Transportation Screening Complete  PCP or Specialist Appt within 3-5 Days Complete  HRI or [place] Complete  Social Work Consult for [place] Planning/Counseling Complete  Palliative Care Screening Not Applicable  Medication Review Press photographer) Referral to Pharmacy  Some recent data might be hidden

## 2019-11-29 NOTE — Progress Notes (Signed)
OT Cancellation Note  Patient Details Name: [name] MRN: [id number] DOB: 1970-05-16   Cancelled Treatment:    Reason Eval/Treat Not Completed: Patient at procedure or test/ unavailable. Pt out of room. RN confirms pt just recently taken for dialysis. Will re-attempt OT tx at later date/time as pt is available.  Jeni Salles, MPH, MS, OTR/L ascom [phone] 11/29/19, 3:16 PM

## 2019-11-29 NOTE — Progress Notes (Signed)
PROGRESS NOTE    [name]   ENI:[id number]  DOB: [birth year]  PCP: Pcp, No    DOA: 11/17/2019 LOS: 12   Brief Narrative   [name] is a 49 y.o. male with medical history significant for end-stage renal disease on hemodialysis on Tuesday, Thursday, Saturday schedule, chronic prednisone therapy in the setting of previous kidney transplant, chronic anemia with baseline hemoglobin 8-9, diverticulitis, GERD, who is admitted on 11/17/2019 with right lower extremity cellulitis after presenting to the ED from home with worsening right lower extremity pain and swelling with purulent discharge, and associated fever/chills, rigors and general malaise.    While holding for a bed in the ED, patient became hypotensive requiring pressors, and ICU level of care for septic shock secondary to right lower extremity cellulitis.       [facility] assumed care of patient on 11/23/19.  BP stable off pressors.  Had been receiving CRRT in ICU with plan for return to hemodialysis 11/3.  BP stable after dialysis, patient transferred out of ICU to progressive unit.     Assessment & Plan   Principal Problem:   Cellulitis of right lower extremity Active Problems:   Severe sepsis (HCC)   Lactic acidosis   Hypoglycemia   Anemia   GERD (gastroesophageal reflux disease)   ESRD (end stage renal disease) (HCC)   Paroxysmal atrial fibrillation (HCC)   Septic shock secondary to severe right lower extremity cellulitis - weaned off pressors, transferred out of ICU on 11/3 after hemodialysis and BP stable.  Clinically improving, afebrile, leukocytosis resolved 11/8, recurred 11/9.  --wound culture grew multiple organisms --Completed Ancef on 11/7 --Tylenol PRN fevers --Analgesics PRN --ID following  Leukocytosis - had resolved, recurrent today with WBC 9.5>>18.5k since yesterday. Patient is asymptomatic and afebrile.  Completed abx 11/7 for cellulitis and wound appears clean and nonpurulent.  No other symptoms  of other infection.   Of note, patient is on steroids, but has been tapering down to his chronic dose after being on stress doses earlier this admission --check repeat wound culture, blood culture, chest xray --procal elevated, at least in part due to ESRD --chest xray reads bibasilar airspace disease --will hold off on resuming antibiotics r now foand monitor clinically   Hypotension - on midodrine per nephro  Acute metabolic encephalopathy - likely due to infection and renal failure, improved.  Still confused at times as to situation.  Reports of hallucinations on 11/4.  ESRD - on hemodialysis MWF.  Nephrology following.  Removal femoral temporary catheter today.  Status post renal transplant - on chronic prednisone.  On taper of prednisone down to usual dose of 10 mg daily (after on stress dose steroids in ICU).  Wide-complex tachycardia - Cardiology following, suspected due to sepsis.   Has not recurred in now several days.   Was on amiodarone which was d/c'd due to bradycardia.  Seen by EP, agreed likely due to sepsis.    Patient has normal LV Systolic function --Telemetry monitoring --Maintain K>4.0 and Mg > 2.0  Paroxysmal A-fib - CHA2DS2-VASc score 0, no anticoagulation indicated and would be higher risk in setting of anemia and thrombocytopenia  Anemia of chronic renal disease - baseline Hbg 8-9.  On EPO.  Monitor CBC.  Hypoglycemic episode - resolved.  Required D10 infusion.  GERD  PPI  Thrombocytopenia - POA, likely due to sepsis.  Resolved.  Monitor CBC.   Patient BMI: Body mass index is 27.71 kg/m.   DVT prophylaxis: heparin injection 5,000  Units Start: 11/17/19 2200   Diet:  (regular because patient refuses renal or low sodium diet)  Diet Orders (From admission, onward)    Start     Ordered   11/26/19 1114  Diet regular Room service appropriate? Yes; Fluid consistency: Thin  Diet effective now       Question Answer Comment  Room service appropriate? Yes    Fluid consistency: Thin      11/26/19 1113            Code Status: Full Code    Subjective 11/29/19    Pt seen this AM at bedside with RN present.  Pt reports his ride comes at 2:00 and he is going to leave AMA if not discharged before then.  Explained he would need transport to rehab, would not be able to leave AMA and then go to rehab.  He says he was told he can.    Denies fever or chills, cough or SOB, or any other acute symptoms.  Leg pain currently controlled.     Disposition Plan & Communication   Status is: Inpatient  Inpatient status remains appropriate because of need for CIR placement for rehab due to profound weakness. Has increased leukocytosis requiring evaluation.  Dispo: The patient is from: Home, lives alone              Anticipated d/c is to: CIR              Anticipated d/c date is: 1 day              Patient currently IS medically stable for d/c.    Family Communication: none at bedside, will attempt to call    Consults, Procedures, Significant Events   Consultants:   Nephrology  Cardiology  PCCM  Procedures:   Dialysis, CRRT  Antimicrobials:  Anti-infectives (From admission, onward)   Start     Dose/Rate Route Frequency Ordered Stop   11/25/19 2200  ceFAZolin (ANCEF) IVPB 1 g/50 mL premix        1 g 100 mL/hr over 30 Minutes Intravenous Every 12 hours 11/25/19 1426 11/27/19 2137   11/23/19 1800  ceFAZolin (ANCEF) IVPB 1 g/50 mL premix  Status:  Discontinued        1 g 100 mL/hr over 30 Minutes Intravenous Every 24 hours 11/23/19 1222 11/25/19 1426   11/22/19 2200  ceFAZolin (ANCEF) IVPB 2g/100 mL premix  Status:  Discontinued        2 g 200 mL/hr over 30 Minutes Intravenous Every 12 hours 11/22/19 1136 11/23/19 1222   11/19/19 2000  vancomycin (VANCOCIN) IVPB 1000 mg/200 mL premix  Status:  Discontinued        1,000 mg 200 mL/hr over 60 Minutes Intravenous Every 24 hours 11/19/19 1422 11/21/19 1107   11/19/19 1800  ceFEPIme  (MAXIPIME) 2 g in sodium chloride 0.9 % 100 mL IVPB  Status:  Discontinued        2 g 200 mL/hr over 30 Minutes Intravenous Every 12 hours 11/19/19 1422 11/22/19 1135   11/19/19 1200  vancomycin (VANCOCIN) IVPB 1000 mg/200 mL premix  Status:  Discontinued        1,000 mg 200 mL/hr over 60 Minutes Intravenous Every T-Th-Sa (Hemodialysis) 11/17/19 1836 11/18/19 1444   11/18/19 2000  ceFEPIme (MAXIPIME) 2 g in sodium chloride 0.9 % 100 mL IVPB  Status:  Discontinued        2 g 200 mL/hr over 30 Minutes Intravenous Every 24 hours  11/18/19 0154 11/18/19 0858   11/18/19 2000  ceFEPIme (MAXIPIME) 1 g in sodium chloride 0.9 % 100 mL IVPB  Status:  Discontinued        1 g 200 mL/hr over 30 Minutes Intravenous Every 24 hours 11/18/19 0858 11/19/19 1422   11/18/19 1500  cefTRIAXone (ROCEPHIN) 1 g in sodium chloride 0.9 % 100 mL IVPB  Status:  Discontinued        1 g 200 mL/hr over 30 Minutes Intravenous Every 24 hours 11/17/19 1800 11/17/19 2207   11/18/19 1442  vancomycin variable dose per unstable renal function (pharmacist dosing)  Status:  Discontinued         Does not apply See admin instructions 11/18/19 1444 11/19/19 1422   11/17/19 2245  ceFEPIme (MAXIPIME) 2 g in sodium chloride 0.9 % 100 mL IVPB        2 g 200 mL/hr over 30 Minutes Intravenous  Once 11/17/19 2214 11/18/19 0156   11/17/19 2200  clindamycin (CLEOCIN) IVPB 600 mg  Status:  Discontinued        600 mg 100 mL/hr over 30 Minutes Intravenous Every 8 hours 11/17/19 2011 11/18/19 2013   11/17/19 2000  vancomycin (VANCOREADY) IVPB 750 mg/150 mL  Status:  Discontinued        750 mg 150 mL/hr over 60 Minutes Intravenous Every 24 hours 11/17/19 1824 11/17/19 1832   11/17/19 1545  piperacillin-tazobactam (ZOSYN) IVPB 3.375 g  Status:  Discontinued        3.375 g 100 mL/hr over 30 Minutes Intravenous  Once 11/17/19 1535 11/17/19 1544   11/17/19 1545  clindamycin (CLEOCIN) IVPB 900 mg  Status:  Discontinued        900 mg 100 mL/hr over  30 Minutes Intravenous  Once 11/17/19 1535 11/17/19 1544   11/17/19 1500  vancomycin (VANCOREADY) IVPB 1250 mg/250 mL  Status:  Discontinued        1,250 mg 166.7 mL/hr over 90 Minutes Intravenous  Once 11/17/19 1410 11/17/19 1411   11/17/19 1415  vancomycin (VANCOCIN) IVPB 1000 mg/200 mL premix        1,000 mg 200 mL/hr over 60 Minutes Intravenous  Once 11/17/19 1401 11/17/19 1825   11/17/19 1415  cefTRIAXone (ROCEPHIN) 2 g in sodium chloride 0.9 % 100 mL IVPB        2 g 200 mL/hr over 30 Minutes Intravenous  Once 11/17/19 1401 11/17/19 1558         Objective   Vitals:   11/29/19 0408 11/29/19 0751 11/29/19 1203 11/29/19 1523  BP: (!) 139/59 (!) 106/50 125/70 139/60  Pulse: 66 69 71   Resp: 17 17 17    Temp: 98.8 F (37.1 C) 98.1 F (36.7 C) 98 F (36.7 C) 98.5 F (36.9 C)  TempSrc:  Oral Oral Oral  SpO2: 98% 95% 100%   Weight: 97.9 kg     Height:        Intake/Output Summary (Last 24 hours) at 11/29/2019 1830 Last data filed at 11/29/2019 0950 Gross per 24 hour  Intake 240 ml  Output --  Net 240 ml   Filed Weights   11/27/19 0452 11/28/19 0500 11/29/19 0408  Weight: 97.1 kg 97.9 kg 97.9 kg    Physical Exam:  General exam: awake sitting up in bed, no acute distress Respiratory system: diminished bases but clear without wheezes or rhonchi, normal respiratory effort Cardiovascular system: normal S1/S2, RRR Extremities:3+ pitting edema of bilateral LE's, RLE dressing in place   Labs  Data Reviewed: I have personally reviewed following labs and imaging studies  CBC: Recent Labs  Lab 11/23/19 0416 11/23/19 0416 11/24/19 0545 11/25/19 0112 11/26/19 0544 11/27/19 0632 11/28/19 1037  WBC 13.8*   < > 15.9* 16.3* 11.7* 9.5 18.5*  NEUTROABS 11.1*  --   --   --  9.2*  --   --   HGB 8.0*   < > 7.9* 9.0* 9.5* 9.7* 10.1*  HCT 25.0*   < > 24.9* 27.6* 29.8* 31.8* 31.3*  MCV 91.9   < > 90.9 90.8 91.7 95.5 92.1  PLT 116*   < > 133* 165 158 148* 196   < > = values  in this interval not displayed.   Basic Metabolic Panel: Recent Labs  Lab 11/25/19 0112 11/26/19 0544 11/27/19 0632 11/28/19 1037 11/29/19 1646  NA 134* 133* 137 136 138  K 3.7  3.7 5.0 5.2* 5.0 3.5  CL 98 96* 101 98 99  CO2 25 24 22 24 27   GLUCOSE 110* 97 94 99 99  BUN 34* 50* 41* 55* 26*  CREATININE 3.38* 4.66* 4.43* 5.51* 3.38*  CALCIUM 9.9 9.9 9.4 9.6 8.9  MG 2.3 2.4 2.2 2.3 1.8  PHOS 3.7 5.2* 5.6* 6.2* 2.8   GFR: Estimated Creatinine Clearance: 30.4 mL/min (A) (by C-G formula based on SCr of 3.38 mg/dL (H)). Liver Function Tests: Recent Labs  Lab 11/25/19 0112 11/26/19 0544 11/27/19 0632 11/28/19 1037 11/29/19 1646  ALBUMIN 2.8* 2.8* 2.8* 2.9* 2.7*   No results for input(s): LIPASE, AMYLASE in the last 168 hours. No results for input(s): AMMONIA in the last 168 hours. Coagulation Profile: No results for input(s): INR, PROTIME in the last 168 hours. Cardiac Enzymes: No results for input(s): CKTOTAL, CKMB, CKMBINDEX, TROPONINI in the last 168 hours. BNP (last 3 results) No results for input(s): PROBNP in the last 8760 hours. HbA1C: No results for input(s): HGBA1C in the last 72 hours. CBG: No results for input(s): GLUCAP in the last 168 hours. Lipid Profile: No results for input(s): CHOL, HDL, LDLCALC, TRIG, CHOLHDL, LDLDIRECT in the last 72 hours. Thyroid Function Tests: No results for input(s): TSH, T4TOTAL, FREET4, T3FREE, THYROIDAB in the last 72 hours. Anemia Panel: No results for input(s): VITAMINB12, FOLATE, FERRITIN, TIBC, IRON, RETICCTPCT in the last 72 hours. Sepsis Labs: Recent Labs  Lab 11/29/19 1646  PROCALCITON 2.52    Recent Results (from the past 240 hour(s))  Resp Panel by RT PCR (RSV, Flu A&B, Covid) - Nasopharyngeal Swab     Status: None   Collection Time: 11/29/19  2:13 PM   Specimen: Nasopharyngeal Swab  Result Value Ref Range Status   SARS Coronavirus 2 by RT PCR NEGATIVE NEGATIVE Final    Comment: (NOTE) SARS-CoV-2 target  nucleic acids are NOT DETECTED.  The SARS-CoV-2 RNA is generally detectable in upper respiratoy specimens during the acute phase of infection. The lowest concentration of SARS-CoV-2 viral copies this assay can detect is 131 copies/mL. A negative result does not preclude SARS-Cov-2 infection and should not be used as the sole basis for treatment or other patient management decisions. A negative result may occur with  improper specimen collection/handling, submission of specimen other than nasopharyngeal swab, presence of viral mutation(s) within the areas targeted by this assay, and inadequate number of viral copies (<131 copies/mL). A negative result must be combined with clinical observations, patient history, and epidemiological information. The expected result is Negative.  Fact Sheet for Patients:  PinkCheek.be  Fact Sheet for Healthcare Providers:  GravelBags.it  This test is no t yet approved or cleared by the [place] and  has been authorized for detection and/or diagnosis of SARS-CoV-2 by FDA under an Emergency Use Authorization (EUA). This EUA will remain  in effect (meaning this test can be used) for the duration of the COVID-19 declaration under Section 564(b)(1) of the Act, 21 U.S.C. section 360bbb-3(b)(1), unless the authorization is terminated or revoked sooner.     Influenza A by PCR NEGATIVE NEGATIVE Final   Influenza B by PCR NEGATIVE NEGATIVE Final    Comment: (NOTE) The Xpert Xpress SARS-CoV-2/FLU/RSV assay is intended as an aid in  the diagnosis of influenza from Nasopharyngeal swab specimens and  should not be used as a sole basis for treatment. Nasal washings and  aspirates are unacceptable for Xpert Xpress SARS-CoV-2/FLU/RSV  testing.  Fact Sheet for Patients: PinkCheek.be  Fact Sheet for Healthcare  Providers: GravelBags.it  This test is not yet approved or cleared by the [place] FDA and  has been authorized for detection and/or diagnosis of SARS-CoV-2 by  FDA under an Emergency Use Authorization (EUA). This EUA will remain  in effect (meaning this test can be used) for the duration of the  Covid-19 declaration under Section 564(b)(1) of the Act, 21  U.S.C. section 360bbb-3(b)(1), unless the authorization is  terminated or revoked.    Respiratory Syncytial Virus by PCR NEGATIVE NEGATIVE Final    Comment: (NOTE) Fact Sheet for Patients: PinkCheek.be  Fact Sheet for Healthcare Providers: GravelBags.it  This test is not yet approved or cleared by the [place] FDA and  has been authorized for detection and/or diagnosis of SARS-CoV-2 by  FDA under an Emergency Use Authorization (EUA). This EUA will remain  in effect (meaning this test can be used) for the duration of the  COVID-19 declaration under Section 564(b)(1) of the Act, 21 U.S.C.  section 360bbb-3(b)(1), unless the authorization is terminated or  revoked. Performed at [facility], [address], [place], [place] [address]       Imaging Studies   DG Chest [place] 1 View  Result Date: 11/29/2019 CLINICAL DATA:  Leukocytosis.  Fever. EXAM: PORTABLE CHEST 1 VIEW COMPARISON:  11/10/2019 FINDINGS: Cardiac enlargement without heart failure or edema. Mild bibasilar airspace disease, with improvement on the right. Small bilateral pleural effusions, with improvement on the right. Calcified lesion in the left lower neck likely thyroid nodule. This is unchanged. This measures approximately 15 x 20 mm. IMPRESSION: Mild bibasilar airspace disease. Improved aeration in the right lung base compared to the prior study. Electronically Signed   By: Franchot Gallo M.D.   On: 11/29/2019 09:03     Medications   Scheduled Meds: .  aspirin EC  81 mg Oral Daily  . Chlorhexidine Gluconate Cloth  6 each Topical Daily  . collagenase   Topical Daily  . epoetin (EPOGEN/PROCRIT) injection  10,000 Units Intravenous Q M,W,F-HD  . feeding supplement  237 mL Oral TID BM  . ferrous sulfate  325 mg Oral BID  . heparin  5,000 Units Subcutaneous Q8H  . mouth rinse  15 mL Mouth Rinse BID  . midodrine  5 mg Oral TID  . pantoprazole  40 mg Oral BID  . predniSONE  20 mg Oral Q breakfast   Followed by  . [START ON 12/01/2019] predniSONE  10 mg Oral Q breakfast  . sodium chloride flush  3 mL Intravenous Q12H   Continuous Infusions: . sodium chloride 250 mL (11/27/19 2105)  .  sodium chloride Stopped (11/23/19 1105)       LOS: 12 days    Time spent: 30 minutes with > 50% spent in coordination of care and direct patient contact.    Ezekiel Slocumb, DO [facility]  11/29/2019, 6:30 PM    If 7PM-7AM, please contact night-coverage. How to contact the [facility] Attending or Consulting provider [place] or covering provider during after hours [place], for this patient?    1. Check the care team in [facility] and look for a) attending/consulting [facility] provider listed and b) the [facility] team listed 2. Log into [email or web address] and use [place]'s universal password to access. If you do not have the password, please contact the hospital operator. 3. Locate the [facility] provider you are looking for under [facility] and page to a number that you can be directly reached. 4. If you still have difficulty reaching the provider, please page the [facility] (Director on Call) for the Hospitalists listed on amion for assistance.

## 2019-11-29 NOTE — Progress Notes (Signed)
[facility]  ROUNDING NOTE   Subjective:    Patient requested dialysis today and he states, he wants to leave AMA after getting his dialysis.   HEMODIALYSIS FLOWSHEET:  Blood Flow Rate (mL/min): 200 mL/min Arterial Pressure (mmHg): -80 mmHg Venous Pressure (mmHg): 70 mmHg Transmembrane Pressure (mmHg): 30 mmHg Ultrafiltration Rate (mL/min): 70 mL/min Dialysate Flow Rate (mL/min): 600 ml/min Conductivity: Machine : 13.8 Conductivity: Machine : 13.8 Dialysis Fluid Bolus: Normal Saline Bolus Amount (mL): 250 mL      Objective:  Vital signs in last 24 hours:  Temp:  [98 F (36.7 C)-98.8 F (37.1 C)] 98 F (36.7 C) (11/09 1203) Pulse Rate:  [57-71] 71 (11/09 1203) Resp:  [17-18] 17 (11/09 1203) BP: (106-154)/(50-76) 125/70 (11/09 1203) SpO2:  [95 %-100 %] 100 % (11/09 1203) Weight:  [97.9 kg] 97.9 kg (11/09 0408)  Weight change: 0 kg Filed Weights   11/27/19 0452 11/28/19 0500 11/29/19 0408  Weight: 97.1 kg 97.9 kg 97.9 kg    Intake/Output: I/O last 3 completed shifts: In: 823 [P.O.:720; I.V.:3; IV Piggyback:100] Out: 2000 [Other:2000]   Intake/Output this shift:  Total I/O In: 240 [P.O.:240] Out: -   Physical Exam: General: In no acute distress  Head: Moist oral mucous membranes  Eyes: Anicteric  Lungs:  Lungs clear   Heart: Irregular,S1S2,no rubs or gallops  Abdomen:  Soft, non tender,non distended  Extremities:  1+ peripheral edema.on left leg,Rt leg wrapped with dressing  Neurologic: Awake, alert, mood swings +  Skin: Rt leg with dressing clean,dry and intact  Access: Left AVG    Basic Metabolic Panel: Recent Labs  Lab 11/24/19 0545 11/24/19 0545 11/25/19 0112 11/25/19 0112 11/26/19 0544 11/27/19 0632 11/28/19 1037  NA 135  137  --  134*  --  133* 137 136  K 3.5  3.5  --  3.7  3.7  --  5.0 5.2* 5.0  CL 97*  99  --  98  --  96* 101 98  CO2 28  28  --  25  --  24 22 24   GLUCOSE 107*  105*  --  110*  --  97 94 99  BUN 24*   25*  --  34*  --  50* 41* 55*  CREATININE 2.52*  2.45*  --  3.38*  --  4.66* 4.43* 5.51*  CALCIUM 9.6  9.8   < > 9.9   < > 9.9 9.4 9.6  MG 2.2  --  2.3  --  2.4 2.2 2.3  PHOS 2.8  --  3.7  --  5.2* 5.6* 6.2*   < > = values in this interval not displayed.    Liver Function Tests: Recent Labs  Lab 11/24/19 0545 11/25/19 0112 11/26/19 0544 11/27/19 0632 11/28/19 1037  ALBUMIN 2.5* 2.8* 2.8* 2.8* 2.9*   No results for input(s): LIPASE, AMYLASE in the last 168 hours. No results for input(s): AMMONIA in the last 168 hours.  CBC: Recent Labs  Lab 11/23/19 0416 11/23/19 0416 11/24/19 0545 11/25/19 0112 11/26/19 0544 11/27/19 0632 11/28/19 1037  WBC 13.8*   < > 15.9* 16.3* 11.7* 9.5 18.5*  NEUTROABS 11.1*  --   --   --  9.2*  --   --   HGB 8.0*   < > 7.9* 9.0* 9.5* 9.7* 10.1*  HCT 25.0*   < > 24.9* 27.6* 29.8* 31.8* 31.3*  MCV 91.9   < > 90.9 90.8 91.7 95.5 92.1  PLT 116*   < >  133* 165 158 148* 196   < > = values in this interval not displayed.    Cardiac Enzymes: No results for input(s): CKTOTAL, CKMB, CKMBINDEX, TROPONINI in the last 168 hours.  BNP: Invalid input(s): POCBNP  CBG: No results for input(s): GLUCAP in the last 168 hours.  Microbiology: Results for orders placed or performed during the hospital encounter of 11/17/19  Wound or Superficial Culture     Status: Abnormal   Collection Time: 11/17/19  2:03 PM   Specimen: Wound  Result Value Ref Range Status   Specimen Description   Final    WOUND Performed at [facility], [address] [place], [place] [address]    Special Requests   Final    RL Performed at [facility], [address] [place], [place] [address]    Gram Stain   Final    NO WBC SEEN ABUNDANT GRAM NEGATIVE RODS FEW GRAM POSITIVE COCCI Performed at [facility], [place] [address] [place], [place] [address]    Culture MULTIPLE ORGANISMS PRESENT, NONE PREDOMINANT (A)  Final   Report Status 11/20/2019  FINAL  Final  Respiratory Panel by RT PCR (Flu A&B, Covid) - Nasopharyngeal Swab     Status: None   Collection Time: 11/17/19  2:05 PM   Specimen: Nasopharyngeal Swab  Result Value Ref Range Status   SARS Coronavirus 2 by RT PCR NEGATIVE NEGATIVE Final    Comment: (NOTE) SARS-CoV-2 target nucleic acids are NOT DETECTED.  The SARS-CoV-2 RNA is generally detectable in upper respiratoy specimens during the acute phase of infection. The lowest concentration of SARS-CoV-2 viral copies this assay can detect is 131 copies/mL. A negative result does not preclude SARS-Cov-2 infection and should not be used as the sole basis for treatment or other patient management decisions. A negative result may occur with  improper specimen collection/handling, submission of specimen other than nasopharyngeal swab, presence of viral mutation(s) within the areas targeted by this assay, and inadequate number of viral copies (<131 copies/mL). A negative result must be combined with clinical observations, patient history, and epidemiological information. The expected result is Negative.  Fact Sheet for Patients:  PinkCheek.be  Fact Sheet for Healthcare Providers:  GravelBags.it  This test is no t yet approved or cleared by the [place] FDA and  has been authorized for detection and/or diagnosis of SARS-CoV-2 by FDA under an Emergency Use Authorization (EUA). This EUA will remain  in effect (meaning this test can be used) for the duration of the COVID-19 declaration under Section 564(b)(1) of the Act, 21 U.S.C. section 360bbb-3(b)(1), unless the authorization is terminated or revoked sooner.     Influenza A by PCR NEGATIVE NEGATIVE Final   Influenza B by PCR NEGATIVE NEGATIVE Final    Comment: (NOTE) The Xpert Xpress SARS-CoV-2/FLU/RSV assay is intended as an aid in  the diagnosis of influenza from Nasopharyngeal swab specimens and  should  not be used as a sole basis for treatment. Nasal washings and  aspirates are unacceptable for Xpert Xpress SARS-CoV-2/FLU/RSV  testing.  Fact Sheet for Patients: PinkCheek.be  Fact Sheet for Healthcare Providers: GravelBags.it  This test is not yet approved or cleared by the [place] FDA and  has been authorized for detection and/or diagnosis of SARS-CoV-2 by  FDA under an Emergency Use Authorization (EUA). This EUA will remain  in effect (meaning this test can be used) for the duration of the  Covid-19 declaration under Section 564(b)(1) of the Act, 21  U.S.C. section 360bbb-3(b)(1), unless the authorization is  terminated or revoked. Performed at [facility], [address] [place], [place] [address]   Blood Culture (routine x 2)     Status: None   Collection Time: 11/17/19  2:06 PM   Specimen: BLOOD  Result Value Ref Range Status   Specimen Description BLOOD BLOOD RIGHT HAND  Final   Special Requests   Final    BOTTLES DRAWN AEROBIC AND ANAEROBIC Blood Culture results may not be optimal due to an inadequate volume of blood received in culture bottles   Culture   Final    NO GROWTH 5 DAYS Performed at [facility], [address] [place], [place] [address]    Report Status 11/22/2019 FINAL  Final  Blood Culture (routine x 2)     Status: None   Collection Time: 11/17/19  2:06 PM   Specimen: BLOOD  Result Value Ref Range Status   Specimen Description BLOOD BLOOD RIGHT HAND  Final   Special Requests   Final    BOTTLES DRAWN AEROBIC AND ANAEROBIC Blood Culture adequate volume   Culture   Final    NO GROWTH 5 DAYS Performed at [facility], [address] [place], [place] [address]    Report Status 11/22/2019 FINAL  Final  MRSA PCR Screening     Status: Abnormal   Collection Time: 11/18/19  1:03 AM   Specimen: Nasopharyngeal  Result Value Ref Range Status   MRSA by PCR POSITIVE  (A) NEGATIVE Corrected    Comment:        The GeneXpert MRSA Assay (FDA approved for NASAL specimens only), is one component of a comprehensive MRSA colonization surveillance program. It is not intended to diagnose MRSA infection nor to guide or monitor treatment for MRSA infections. RESULT CALLED TO, READ BACK BY AND VERIFIED WITH: CYNTHIA VASQUEZ @0245  ON 11/18/19 SKL Performed at [facility], [address] [place], [place] [address] CORRECTED ON 10/29 AT 0249: PREVIOUSLY REPORTED AS POSITIVE        The GeneXpert MRSA Assay (FDA approved for NASAL specimens only), is one component of a comprehensive MRSA colonization surveillance program. It is not intended to diagnose MRSA infection  nor to guide or monitor treatment for MRSA infections. CYNTHIA VASQUEZ @0245  ON 11/18/19 SKL     Coagulation Studies: No results for input(s): LABPROT, INR in the last 72 hours.  Urinalysis: No results for input(s): COLORURINE, LABSPEC, PHURINE, GLUCOSEU, HGBUR, BILIRUBINUR, KETONESUR, PROTEINUR, UROBILINOGEN, NITRITE, LEUKOCYTESUR in the last 72 hours.  Invalid input(s): APPERANCEUR    Imaging: DG Chest Port 1 View  Result Date: 11/29/2019 CLINICAL DATA:  Leukocytosis.  Fever. EXAM: PORTABLE CHEST 1 VIEW COMPARISON:  11/10/2019 FINDINGS: Cardiac enlargement without heart failure or edema. Mild bibasilar airspace disease, with improvement on the right. Small bilateral pleural effusions, with improvement on the right. Calcified lesion in the left lower neck likely thyroid nodule. This is unchanged. This measures approximately 15 x 20 mm. IMPRESSION: Mild bibasilar airspace disease. Improved aeration in the right lung base compared to the prior study. Electronically Signed   By: Franchot Gallo M.D.   On: 11/29/2019 09:03     Medications:   . sodium chloride 250 mL (11/27/19 2105)  . sodium chloride Stopped (11/23/19 1105)   . aspirin EC  81 mg Oral Daily  . Chlorhexidine Gluconate  Cloth  6 each Topical Daily  . collagenase   Topical Daily  . epoetin (EPOGEN/PROCRIT) injection  10,000  Units Intravenous Q M,W,F-HD  . feeding supplement  237 mL Oral TID BM  . ferrous sulfate  325 mg Oral BID  . heparin  5,000 Units Subcutaneous Q8H  . mouth rinse  15 mL Mouth Rinse BID  . midodrine  5 mg Oral TID  . pantoprazole  40 mg Oral BID  . predniSONE  20 mg Oral Q breakfast   Followed by  . [START ON 12/01/2019] predniSONE  10 mg Oral Q breakfast  . sodium chloride flush  3 mL Intravenous Q12H   sodium chloride, acetaminophen **OR** acetaminophen, atropine, dextrose, HYDROcodone-acetaminophen, HYDROmorphone (DILAUDID) injection, loperamide, melatonin, ondansetron (ZOFRAN) IV, sodium chloride, sodium chloride flush, traZODone  Assessment/ Plan:  [name] is a 49 y.o. black male with end stage renal disease on hemodialysis, history of kidney transplant, lupus nephritis, and hypotension who was admitted to [facility] on 11/17/2019 for Cellulitis of right lower extremity [H90.931] ESRD on hemodialysis ([place]) [N18.6, Z99.2] Hypotension [I95.9] Severe sepsis ([place]) [A41.9, R65.20]  [facility] [place]/ Left AVG   # End stage renal disease.  CRRT from 10/30-10/31, Then 11/1-11/2  Patient is receiving scheduled dialysis treatment today Will continue TTS schedule while hospitalized  # Sepsis/hypotension with right lower extremity cellulitis   Patient treated with antibiotics for sepsis  He is normotensive today Midodrine reduced to 5 mg TID from yesterday  #Anemia with chronic kidney disease.   Lab Results  Component Value Date   HGB 10.1 (L) 11/28/2019   Stays at goal  #Secondary Hyperparathyroidism Lab Results  Component Value Date   CALCIUM 9.6 11/28/2019   PHOS 6.2 (H) 11/28/2019       LOS: 12 Henriette Hesser 11/9/20212:28 PM

## 2019-11-29 NOTE — Consult Note (Signed)
NAME: [name]  DOB: [birth year]  MRN: [id number]  Date/Time: 11/29/2019 3:43 PM  REQUESTING PROVIDER: Dr.Griffith Subjective:  REASON FOR CONSULT: leucocytosis ? [name] is a 49 y.o. male with a history of lupus, ESRD, s/p renal transpalnt, failed, s/p nephrectomy of the transplant kidney on prednisone presented first to the ED on 10/21 with a cult on his rt leg which he sustained by the door slamming and cutting his leg . The wound was sutured and he was discharged- he came back to the ED on 11/17/19 with swelling and pain. Vitals were BP 11/17/19 1321 (!) 85/40     Pulse Rate 11/17/19 1321 72     Resp 11/17/19 1321 (!) 30     Temp 11/17/19 1321 98.5 F (36.9 C)     Temp Source 11/17/19 1321 Oral     SpO2 11/17/19 1321 90 %   He was diagnosed with severe cellulitis, labs sent and started on vanco and ceftriaxone.  Labs were notable for sodium 139, potassium 4.7, bicarb 27, anion gap 15, CPK 136, WBC 9.8 with neutrophilia, hemoglobin 8.9, platelets 117, INR 1.2, lactic acid 3.3.  His COVID-19 PCR and influenza PCR were both negative.  CT of the right lower extremity showed evidence of subcutaneous edema consistent with cellulitis in the absence of any evidence of drainable abscess or osteomyelitis.  He met sepsis criteria therefore he received IV fluid resuscitation (2 L), IV Rocephin and vancomycin.  He was to be admitted to the stepdown unit by the hospitalist.  While awaiting bed placement in the ED, he became further hypotensive with worsening lactic acidosis, which was unresponsive to 250 cc bolus. He ultimately required Levophed infusion. He was admitted to ICU.He also had hypoglycemia and received D0 Ceftriaxone was changed to cefepime and clindamycin was added. H e was also started on CRRT instead of HD. In the ICU he had a wide QRS V tach and after adenosine, amio he had to be ultimately cardioverted. Pt had leucocytosis which was resolved he completed cefazolin  yesterday. On 1/8 wbc was 18.5 and I am asked to see patient. He is feeling better than before No cough, no fever, no chest pain  today Past Medical History:  Diagnosis Date  . Anemia   . Collagen vascular disease ([place])   . Lupus ([place])   . Renal disorder   . Renal insufficiency   . Renal transplant recipient     Past Surgical History:  Procedure Laterality Date  . NEPHRECTOMY TRANSPLANTED ORGAN      Social History   Socioeconomic History  . Marital status: Divorced    Spouse name: Not on file  . Number of children: 2  . Years of education: Not on file  . Highest education level: Not on file  Occupational History  . Not on file  Tobacco Use  . Smoking status: Former Smoker    Packs/day: 0.50    Years: 13.00    Pack years: 6.50    Types: Cigarettes  . Smokeless tobacco: Never Used  Vaping Use  . Vaping Use: Never used  Substance and Sexual Activity  . Alcohol use: Yes    Comment: Occasional  . Drug use: No  . Sexual activity: Not on file  Other Topics Concern  . Not on file  Social History Narrative  . Not on file   Social Determinants of Health   Financial Resource Strain:   . Difficulty of Paying Living Expenses: Not on file  Food  Insecurity:   . Worried About Charity fundraiser in the Last Year: Not on file  . Ran Out of Food in the Last Year: Not on file  Transportation Needs:   . Lack of Transportation (Medical): Not on file  . Lack of Transportation (Non-Medical): Not on file  Physical Activity:   . Days of Exercise per Week: Not on file  . Minutes of Exercise per Session: Not on file  Stress:   . Feeling of Stress : Not on file  Social Connections:   . Frequency of Communication with Friends and Family: Not on file  . Frequency of Social Gatherings with Friends and Family: Not on file  . Attends Religious Services: Not on file  . Active Member of Clubs or Organizations: Not on file  . Attends Archivist Meetings: Not on file  .  Marital Status: Not on file  Intimate Partner Violence:   . Fear of Current or Ex-Partner: Not on file  . Emotionally Abused: Not on file  . Physically Abused: Not on file  . Sexually Abused: Not on file    Family History  Problem Relation Age of Onset  . Hypertension Other   . Brain cancer Mother        Died at age 39  . Aneurysm Mother   . CAD Father   . Heart attack Father 3   Allergies  Allergen Reactions  . Ciprofloxacin Rash  . Sulfa Antibiotics Rash   ? Current Facility-Administered Medications  Medication Dose Route Frequency Provider Last Rate Last Admin  . 0.9 %  sodium chloride infusion  250 mL Intravenous PRN Howerter, Justin B, DO 10 mL/hr at 11/27/19 2105 250 mL at 11/27/19 2105  . 0.9 %  sodium chloride infusion  250 mL Intravenous Continuous Lang Snow, NP   Stopped at 11/23/19 1105  . acetaminophen (TYLENOL) tablet 650 mg  650 mg Oral Q6H PRN Howerter, Justin B, DO       Or  . acetaminophen (TYLENOL) suppository 650 mg  650 mg Rectal Q6H PRN Howerter, Justin B, DO      . aspirin EC tablet 81 mg  81 mg Oral Daily Howerter, Justin B, DO   81 mg at 11/28/19 0901  . atropine 1 MG/10ML injection 1 mg  1 mg Intravenous Once PRN Rust-Chester, Huel Cote, NP      . Chlorhexidine Gluconate Cloth 2 % PADS 6 each  6 each Topical Daily Bradly Bienenstock, NP   6 each at 11/28/19 [phone]  . collagenase (SANTYL) ointment   Topical Daily Rosine Door, MD   1 application at 60/10/93 2200  . dextrose 50 % solution 50 mL  1 ampule Intravenous PRN Howerter, Justin B, DO   50 mL at 11/18/19 1741  . epoetin alfa (EPOGEN) injection 10,000 Units  10,000 Units Intravenous Q M,W,F-HD Kolluru, Sarath, MD   10,000 Units at 11/28/19 1226  . feeding supplement (ENSURE ENLIVE / ENSURE PLUS) liquid 237 mL  237 mL Oral TID BM Rosine Door, MD   237 mL at 11/28/19 2138  . ferrous sulfate tablet 325 mg  325 mg Oral BID Howerter, Justin B, DO   325 mg at 11/28/19 1638  . heparin  injection 5,000 Units  5,000 Units Subcutaneous Q8H Howerter, Justin B, DO   5,000 Units at 11/29/19 0559  . HYDROcodone-acetaminophen (NORCO) 10-325 MG per tablet 1 tablet  1 tablet Oral Q6H PRN Nicole Kindred A, DO   1 tablet  at 11/29/19 0353  . HYDROmorphone (DILAUDID) injection 0.5 mg  0.5 mg Intravenous Q4H PRN Esaw Grandchild A, DO   0.5 mg at 11/29/19 1422  . loperamide (IMODIUM) capsule 2 mg  2 mg Oral Q2H PRN Esaw Grandchild A, DO   2 mg at 11/29/19 1422  . MEDLINE mouth rinse  15 mL Mouth Rinse BID Harlon Ditty D, NP   15 mL at 11/28/19 0902  . melatonin tablet 2.5 mg  2.5 mg Oral QHS PRN Tressie Ellis, RPH   2.5 mg at 11/21/19 2115  . midodrine (PROAMATINE) tablet 5 mg  5 mg Oral TID Mosetta Pigeon, MD   5 mg at 11/29/19 0940  . ondansetron (ZOFRAN) injection 4 mg  4 mg Intravenous Q6H PRN Manuela Schwartz, NP   4 mg at 11/28/19 0415  . pantoprazole (PROTONIX) EC tablet 40 mg  40 mg Oral BID Ronnald Ramp, RPH   40 mg at 11/26/19 0831  . predniSONE (DELTASONE) tablet 20 mg  20 mg Oral Q breakfast Ronnald Ramp, RPH   20 mg at 11/29/19 1202   Followed by  . [START ON 12/01/2019] predniSONE (DELTASONE) tablet 10 mg  10 mg Oral Q breakfast Ronnald Ramp, RPH      . sodium chloride 0.9 % primer fluid for CRRT   CRRT PRN Kolluru, Sarath, MD      . sodium chloride flush (NS) 0.9 % injection 3 mL  3 mL Intravenous Q12H Howerter, Justin B, DO   3 mL at 11/29/19 0900  . sodium chloride flush (NS) 0.9 % injection 3 mL  3 mL Intravenous PRN Howerter, Justin B, DO      . traZODone (DESYREL) tablet 50 mg  50 mg Oral QHS PRN Rust-Chester, Britton L, NP   50 mg at 11/23/19 0054     Abtx:  Anti-infectives (From admission, onward)   Start     Dose/Rate Route Frequency Ordered Stop   11/25/19 2200  ceFAZolin (ANCEF) IVPB 1 g/50 mL premix        1 g 100 mL/hr over 30 Minutes Intravenous Every 12 hours 11/25/19 1426 11/27/19 2137   11/23/19 1800  ceFAZolin (ANCEF) IVPB 1 g/50 mL premix   Status:  Discontinued        1 g 100 mL/hr over 30 Minutes Intravenous Every 24 hours 11/23/19 1222 11/25/19 1426   11/22/19 2200  ceFAZolin (ANCEF) IVPB 2g/100 mL premix  Status:  Discontinued        2 g 200 mL/hr over 30 Minutes Intravenous Every 12 hours 11/22/19 1136 11/23/19 1222   11/19/19 2000  vancomycin (VANCOCIN) IVPB 1000 mg/200 mL premix  Status:  Discontinued        1,000 mg 200 mL/hr over 60 Minutes Intravenous Every 24 hours 11/19/19 1422 11/21/19 1107   11/19/19 1800  ceFEPIme (MAXIPIME) 2 g in sodium chloride 0.9 % 100 mL IVPB  Status:  Discontinued        2 g 200 mL/hr over 30 Minutes Intravenous Every 12 hours 11/19/19 1422 11/22/19 1135   11/19/19 1200  vancomycin (VANCOCIN) IVPB 1000 mg/200 mL premix  Status:  Discontinued        1,000 mg 200 mL/hr over 60 Minutes Intravenous Every T-Th-Sa (Hemodialysis) 11/17/19 1836 11/18/19 1444   11/18/19 2000  ceFEPIme (MAXIPIME) 2 g in sodium chloride 0.9 % 100 mL IVPB  Status:  Discontinued        2 g 200 mL/hr over 30 Minutes Intravenous  Every 24 hours 11/18/19 0154 11/18/19 0858   11/18/19 2000  ceFEPIme (MAXIPIME) 1 g in sodium chloride 0.9 % 100 mL IVPB  Status:  Discontinued        1 g 200 mL/hr over 30 Minutes Intravenous Every 24 hours 11/18/19 0858 11/19/19 1422   11/18/19 1500  cefTRIAXone (ROCEPHIN) 1 g in sodium chloride 0.9 % 100 mL IVPB  Status:  Discontinued        1 g 200 mL/hr over 30 Minutes Intravenous Every 24 hours 11/17/19 1800 11/17/19 2207   11/18/19 1442  vancomycin variable dose per unstable renal function (pharmacist dosing)  Status:  Discontinued         Does not apply See admin instructions 11/18/19 1444 11/19/19 1422   11/17/19 2245  ceFEPIme (MAXIPIME) 2 g in sodium chloride 0.9 % 100 mL IVPB        2 g 200 mL/hr over 30 Minutes Intravenous  Once 11/17/19 2214 11/18/19 0156   11/17/19 2200  clindamycin (CLEOCIN) IVPB 600 mg  Status:  Discontinued        600 mg 100 mL/hr over 30 Minutes  Intravenous Every 8 hours 11/17/19 2011 11/18/19 2013   11/17/19 2000  vancomycin (VANCOREADY) IVPB 750 mg/150 mL  Status:  Discontinued        750 mg 150 mL/hr over 60 Minutes Intravenous Every 24 hours 11/17/19 1824 11/17/19 1832   11/17/19 1545  piperacillin-tazobactam (ZOSYN) IVPB 3.375 g  Status:  Discontinued        3.375 g 100 mL/hr over 30 Minutes Intravenous  Once 11/17/19 1535 11/17/19 1544   11/17/19 1545  clindamycin (CLEOCIN) IVPB 900 mg  Status:  Discontinued        900 mg 100 mL/hr over 30 Minutes Intravenous  Once 11/17/19 1535 11/17/19 1544   11/17/19 1500  vancomycin (VANCOREADY) IVPB 1250 mg/250 mL  Status:  Discontinued        1,250 mg 166.7 mL/hr over 90 Minutes Intravenous  Once 11/17/19 1410 11/17/19 1411   11/17/19 1415  vancomycin (VANCOCIN) IVPB 1000 mg/200 mL premix        1,000 mg 200 mL/hr over 60 Minutes Intravenous  Once 11/17/19 1401 11/17/19 1825   11/17/19 1415  cefTRIAXone (ROCEPHIN) 2 g in sodium chloride 0.9 % 100 mL IVPB        2 g 200 mL/hr over 30 Minutes Intravenous  Once 11/17/19 1401 11/17/19 1558      REVIEW OF SYSTEMS:  Const: negative fever, negative chills, negative weight loss Eyes: negative diplopia or visual changes, negative eye pain ENT: negative coryza, negative sore throat Resp: negative cough, hemoptysis, has dyspnea Cards: negative for chest pain, palpitations, lower extremity edema GU: negative for frequency, dysuria and hematuria GI: Negative for abdominal pain, diarrhea, bleeding, constipation Skin: negative for rash and pruritus Heme: negative for easy bruising and gum/nose bleeding MS: general  weakness Neurolo:negative for headaches, dizziness, vertigo, memory problems  Psych: negative for feelings of anxiety, depression  Endocrine: negative for thyroid, diabetes Allergy/Immunology- as above: Objective:  VITALS:  BP 125/70 (BP Location: Right Wrist)   Pulse 71   Temp 98 F (36.7 C) (Oral)   Resp 17   Ht $R'6\' 2"'mG$   (1.88 m)   Wt 97.9 kg   SpO2 100%   BMI 27.71 kg/m  PHYSICAL EXAM:  General: Alert, cooperative, no distress, appears stated age. Getting dialysis Head: Normocephalic, without obvious abnormality, atraumatic. Eyes: Conjunctivae clear, anicteric sclerae. Pupils are equal ENT Nares  normal. No drainage or sinus tenderness. Lips, mucosa, and tongue normal. No Thrush Neck: Supple, symmetrical, no adenopathy, thyroid: non tender no carotid bruit and no JVD. Back: No CVA tenderness. Lungs: B/L air entry Heart: s1s2 Abdomen: Soft, non-tender,not distended. Fullness left side of the abdomen Extremities: b/l edema legs Wound rt leg-  Skin: No rashes or lesions. Or bruising Lymph: Cervical, supraclavicular normal. Neurologic: Grossly non-focal Pertinent Labs Lab Results CBC    Component Value Date/Time   WBC 18.5 (H) 11/28/2019 1037   RBC 3.40 (L) 11/28/2019 1037   HGB 10.1 (L) 11/28/2019 1037   HCT 31.3 (L) 11/28/2019 1037   PLT 196 11/28/2019 1037   MCV 92.1 11/28/2019 1037   MCH 29.7 11/28/2019 1037   MCHC 32.3 11/28/2019 1037   RDW 19.7 (H) 11/28/2019 1037   LYMPHSABS 1.0 11/26/2019 0544   MONOABS 0.8 11/26/2019 0544   EOSABS 0.0 11/26/2019 0544   BASOSABS 0.0 11/26/2019 0544    CMP Latest Ref Rng & Units 11/28/2019 11/27/2019 11/26/2019  Glucose 70 - 99 mg/dL 99 94 97  BUN 6 - 20 mg/dL 55(H) 41(H) 50(H)  Creatinine 0.61 - 1.24 mg/dL 5.51(H) 4.43(H) 4.66(H)  Sodium 135 - 145 mmol/L 136 137 133(L)  Potassium 3.5 - 5.1 mmol/L 5.0 5.2(H) 5.0  Chloride 98 - 111 mmol/L 98 101 96(L)  CO2 22 - 32 mmol/L $RemoveB'24 22 24  '[id number]$ Calcium 8.9 - 10.3 mg/dL 9.6 9.4 9.9  Total Protein 6.5 - 8.1 g/dL - - -  Total Bilirubin 0.3 - 1.2 mg/dL - - -  Alkaline Phos 38 - 126 U/L - - -  AST 15 - 41 U/L - - -  ALT 0 - 44 U/L - - -      Microbiology: Recent Results (from the past 240 hour(s))  Resp Panel by RT PCR (RSV, Flu A&B, Covid) - Nasopharyngeal Swab     Status: None   Collection Time: 11/29/19   2:13 PM   Specimen: Nasopharyngeal Swab  Result Value Ref Range Status   SARS Coronavirus 2 by RT PCR NEGATIVE NEGATIVE Final    Comment: (NOTE) SARS-CoV-2 target nucleic acids are NOT DETECTED.  The SARS-CoV-2 RNA is generally detectable in upper respiratoy specimens during the acute phase of infection. The lowest concentration of SARS-CoV-2 viral copies this assay can detect is 131 copies/mL. A negative result does not preclude SARS-Cov-2 infection and should not be used as the sole basis for treatment or other patient management decisions. A negative result may occur with  improper specimen collection/handling, submission of specimen other than nasopharyngeal swab, presence of viral mutation(s) within the areas targeted by this assay, and inadequate number of viral copies (<131 copies/mL). A negative result must be combined with clinical observations, patient history, and epidemiological information. The expected result is Negative.  Fact Sheet for Patients:  PinkCheek.be  Fact Sheet for Healthcare Providers:  GravelBags.it  This test is no t yet approved or cleared by the [place] FDA and  has been authorized for detection and/or diagnosis of SARS-CoV-2 by FDA under an Emergency Use Authorization (EUA). This EUA will remain  in effect (meaning this test can be used) for the duration of the COVID-19 declaration under Section 564(b)(1) of the Act, 21 U.S.C. section 360bbb-3(b)(1), unless the authorization is terminated or revoked sooner.     Influenza A by PCR NEGATIVE NEGATIVE Final   Influenza B by PCR NEGATIVE NEGATIVE Final    Comment: (NOTE) The Xpert Xpress SARS-CoV-2/FLU/RSV assay is intended as  an aid in  the diagnosis of influenza from Nasopharyngeal swab specimens and  should not be used as a sole basis for treatment. Nasal washings and  aspirates are unacceptable for Xpert Xpress SARS-CoV-2/FLU/RSV   testing.  Fact Sheet for Patients: PinkCheek.be  Fact Sheet for Healthcare Providers: GravelBags.it  This test is not yet approved or cleared by the [place] FDA and  has been authorized for detection and/or diagnosis of SARS-CoV-2 by  FDA under an Emergency Use Authorization (EUA). This EUA will remain  in effect (meaning this test can be used) for the duration of the  Covid-19 declaration under Section 564(b)(1) of the Act, 21  U.S.C. section 360bbb-3(b)(1), unless the authorization is  terminated or revoked.    Respiratory Syncytial Virus by PCR NEGATIVE NEGATIVE Final    Comment: (NOTE) Fact Sheet for Patients: PinkCheek.be  Fact Sheet for Healthcare Providers: GravelBags.it  This test is not yet approved or cleared by the [place] FDA and  has been authorized for detection and/or diagnosis of SARS-CoV-2 by  FDA under an Emergency Use Authorization (EUA). This EUA will remain  in effect (meaning this test can be used) for the duration of the  COVID-19 declaration under Section 564(b)(1) of the Act, 21 U.S.C.  section 360bbb-3(b)(1), unless the authorization is terminated or  revoked. Performed at [facility], [address] [place], [place] [address]     IMAGING RESULTS: I have personally reviewed the films ?Diffuse and marked subcutaneous soft tissue swelling/edema/fluid suggesting severe cellulitis. I do not see a discrete drainable fluid collection to suggest a soft tissue abscess Impression/Recommendation ? ?49 yr male with ESRD, 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 He was in septic shock and was in the ICU. Had CRRT, the wound culture had gram neg rods but was not finalized due to multiple organisms present. He was  treated with vanco, ceftriaxone, followed by vancom cefepime and clindamycin and then cefazolin for 4 days. The total duration of antibiotic was 10 days. Leucocytosis resolved and then shot up again and I am asked to see the patient for the same The wound does not look infected now As he is stable and no obvious signs of infection currently will hold off starting antibiotics and will repeat CBC . He laso received high dose steroids which is being tapered now. Discussed the management with the patient and Dr.Griffith  ?

## 2019-11-29 NOTE — Progress Notes (Addendum)
Physical Therapy Treatment Patient Details Name: [name] MRN: [id number] DOB: August 10, 1970 Today's Date: 11/29/2019    History of Present Illness presented to ER secondary to R LE swelling, pain; admitted for management of severe cellulitis of R LE.  Hospital course additionally significant for hypotension, requiring transfer to CCU and pressor support; episode of vtach with synchronized cardioversion (10/30), CRRT (via temp dialysis cath, left) 10/30-11/2.  L temp fem cath discontinued and patient cleared for participation with session 11/24/19.    PT Comments    Consistent progression towards all mobility goals noted.  Continues to require +2 for optimal safety, but tolerating increased gait distance and overall activity level, demonstrating increased functional strength with basic transfers.  Remains unable to complete sit/stand from standard height surfaces; unsafe to attempt without +2 at this time. Continues with significant recurvatum bilat LEs, R > L, with all closed-chain, standing activities; heavy WBing bilat UEs in upright positioning.  Poor eccentric control with stand to sit; very high risk for buckling/LOB when knees in neutral alignment. Generally agitated with extended hospital stay, self-directive of session this date; however, does participate and show progress with each attempt. Discharge recommendation of CIR remains appropriate for patient, as patient will certainly benefit from high-intensity post-acute rehab services and demonstrates the ability to tolerate 3 hours of therapy per day necessary to complete this level of rehab.  Of note, patient noted with bleeding to R heel during session; heels floated end of session.  RN informed/aware.    Follow Up Recommendations  CIR;Other (comment) will certainly benefit from high-intensity post-acute rehab services and demonstrates the ability to tolerate 3 hours of therapy per day necessary to complete this level of rehab.       Equipment Recommendations  Rolling walker with 5" wheels;3in1 (PT)    Recommendations for Other Services       Precautions / Restrictions Precautions Precautions: Fall Precaution Comments: L AVF, R LE wound (ace bandaged) Restrictions Weight Bearing Restrictions: No    Mobility  Bed Mobility Overal bed mobility: Needs Assistance Bed Mobility: Supine to Sit;Sit to Supine     Supine to sit: Min assist Sit to supine: Mod assist   General bed mobility comments: assist for bilat LE elevation into bed  Transfers Overall transfer level: Needs assistance Equipment used: Rolling walker (2 wheeled) Transfers: Sit to/from Stand Sit to Stand: Mod assist;+2 physical assistance         General transfer comment: from signficantly elevated bed surface; heavy WBing bilat UEs (pulling/pushing on RW) to complete lift off and standing. Unable to complete from standard bed height despite max assist +2  Ambulation/Gait Ambulation/Gait assistance: Min assist;+2 safety/equipment Gait Distance (Feet): 25 Feet Assistive device: Rolling walker (2 wheeled)       General Gait Details: reciprocal stepping pattern with decreased step height/length; forward flexed posture with heavy WBing bilat UEs.  Bilat genu recurvatum in loading phases of gait, R > L; high risk for buckling otherwise.  Sats 93%, HR 85 after gait trial on 3L O2   Stairs             Wheelchair Mobility    Modified Rankin (Stroke Patients Only)       Balance Overall balance assessment: Needs assistance Sitting-balance support: No upper extremity supported;Feet supported Sitting balance-Leahy Scale: Good     Standing balance support: Bilateral upper extremity supported Standing balance-Leahy Scale: Fair Standing balance comment: requires alot of assistance to achieve standing but once in standing with knee locked  in extension is able to maintainstandiing with CGA.                             Cognition Arousal/Alertness: Awake/alert Behavior During Therapy: Agitated Overall Cognitive Status: Within Functional Limits for tasks assessed                                 General Comments: Generally oriented to basic information; globally agitated with extended stay in hospital, eager for next level of care.  Very self-directive of care this date      Exercises Other Exercises Other Exercises: Sit/stand x3 with RW, heavy mod assist +2 to complete; heavy assist from bilat UEs.  Unable to copmlete without UE support, unable to complete from standard seating surface height.  Poor eccentric control with stand to sit Other Exercises: Standing LE therex, 3x25, marching with RW, min assist +2 for safety.  Very low amplitude marching, heavy reliance on UEs for balance/support.    General Comments        Pertinent Vitals/Pain Pain Assessment: 0-10 Pain Score: 8  Pain Location: R LE Pain Descriptors / Indicators: Aching Pain Intervention(s): Limited activity within patient's tolerance;Repositioned;Monitored during session;Patient requesting pain meds-RN notified    Home Living                      Prior Function            PT Goals (current goals can now be found in the care plan section) Acute Rehab PT Goals Patient Stated Goal: to get better PT Goal Formulation: Patient unable to participate in goal setting Time For Goal Achievement: 12/08/19 Potential to Achieve Goals: Good Progress towards PT goals: Progressing toward goals    Frequency    Min 2X/week      PT Plan Current plan remains appropriate    Co-evaluation              AM-PAC PT "6 Clicks" Mobility   Outcome Measure  Help needed turning from your back to your side while in a flat bed without using bedrails?: A Little Help needed moving from lying on your back to sitting on the side of a flat bed without using bedrails?: A Lot Help needed moving to and from a bed to a chair  (including a wheelchair)?: A Lot Help needed standing up from a chair using your arms (e.g., wheelchair or bedside chair)?: A Lot Help needed to walk in hospital room?: A Lot Help needed climbing 3-5 steps with a railing? : Total 6 Click Score: 12    End of Session Equipment Utilized During Treatment: Gait belt Activity Tolerance: Patient tolerated treatment well Patient left: in bed;with bed alarm set;with call bell/phone within reach Nurse Communication: Mobility status PT Visit Diagnosis: Muscle weakness (generalized) (M62.81);Difficulty in walking, not elsewhere classified (R26.2);Pain Pain - Right/Left: Right Pain - part of body: Leg     Time: 1112-1150 PT Time Calculation (min) (ACUTE ONLY): 38 min  Charges:  $Gait Training: 8-22 mins $Therapeutic Exercise: 8-22 mins $Therapeutic Activity: 8-22 mins                    Tonya Carlile H. Owens Shark, PT, DPT, NCS 11/29/19, 12:03 PM [phone]

## 2019-11-29 NOTE — TOC Progression Note (Signed)
Transition of Care [facility]) - Progression Note    Patient Details  Name: [name] MRN: [id number] Date of Birth: September 17, 1970  Transition [facility] Care [facility]) CM/SW [place], RN Phone Number: 11/29/2019, 9:24 AM  Clinical Narrative:   Damaris Schooner with [facility] at the [place] facility, will need current PT note and facesheet to fax for approval. PT notified, patient is aware and will cooperate with evaluation.    Expected Discharge Plan: [place] Barriers to Discharge: Continued Medical Work up  Expected Discharge Plan and Services Expected Discharge Plan: [place]   Discharge Planning Services: CM Consult Post Acute Care Choice: [place], [place] Living arrangements for the past 2 months: Single Family Home                                       Social Determinants of Health (SDOH) Interventions    Readmission Risk Interventions Readmission Risk Prevention Plan 11/24/2019  Transportation Screening Complete  PCP or Specialist Appt within 3-5 Days Complete  HRI or [place] Complete  Social Work Consult for [place] Planning/Counseling Complete  Palliative Care Screening Not Applicable  Medication Review [employer]) Referral to Pharmacy  Some recent data might be hidden

## 2019-11-30 DIAGNOSIS — I495 Sick sinus syndrome: Secondary | ICD-10-CM

## 2019-11-30 DIAGNOSIS — Z9119 Patient's noncompliance with other medical treatment and regimen: Secondary | ICD-10-CM

## 2019-11-30 DIAGNOSIS — N189 Chronic kidney disease, unspecified: Secondary | ICD-10-CM

## 2019-11-30 DIAGNOSIS — D631 Anemia in chronic kidney disease: Secondary | ICD-10-CM

## 2019-11-30 DIAGNOSIS — I455 Other specified heart block: Secondary | ICD-10-CM

## 2019-11-30 DIAGNOSIS — M329 Systemic lupus erythematosus, unspecified: Secondary | ICD-10-CM | POA: Diagnosis present

## 2019-11-30 LAB — OXYCODONES,MS,WB/SP RFX
Oxycocone: NEGATIVE ng/mL
Oxycodones Confirmation: NEGATIVE
Oxymorphone: NEGATIVE ng/mL

## 2019-11-30 MED ORDER — POTASSIUM CHLORIDE CRYS ER 20 MEQ PO TBCR
40.0000 meq | EXTENDED_RELEASE_TABLET | Freq: Once | ORAL | Status: DC
  Filled 2019-11-30: qty 2

## 2019-11-30 NOTE — Progress Notes (Signed)
Pt came back form leaving [facility]a to wait for placement at the [place] pt still refuses anything but pain meds and immodium pt also refuses to put on gown or go on telemetry

## 2019-11-30 NOTE — Progress Notes (Signed)
Pt reuses any med's besides pain and imodium, also refuses to let us assess him or participate in any nursing care

## 2019-11-30 NOTE — Progress Notes (Addendum)
PROGRESS NOTE  PHU RECORD ION:[id number] DOB: [birth year]   PCP: Pcp, No  Patient is from: Home.  DOA: 11/17/2019 LOS: 75  Chief complaints: Right lower extremity pain and swelling with purulent discharge  Brief Narrative / Interim history: 49 year old male with history of ESRD on HD TTS, previous kidney transplant on prednisone chronically, anemia of renal disease, diverticulosis and GERD mated with right lower extremity cellulitis after he presented with right lower extremity pain, swelling with purulent discharge and associated fever and rigors.  While waiting on.  In ED, became hypotensive requiring pressors and ICU level of care for septic shock secondary to right lower extremity cellulitis.  Eventually stabilized and transferred to [facility] service on 11/23/2019.   Subjective: Seen and examined earlier this morning.  Patient is unhappy as he was not getting his pain medication on time overnight.  He also refuses to go to hemodialysis here saying hemodialysis staff are not nice to him.  He  refuses medications of his pain medication and Imodium.  No other complaints.  I was called to bedside this afternoon when he threatened to leave AMA again.  He was getting dressed up to leave the hospital when I arrived.  He states insurance has rejected his CIR, and he is ready to go home and get to his dialysis in [place] tomorrow.  I recommended against leaving as he is not physically stable.  However, patient was adamant about leaving.  He is awake and oriented x4 including situations.  He understands the risk and benefits but making poor decision.  Objective: Vitals:   11/29/19 1943 11/30/19 0527 11/30/19 0823 11/30/19 1100  BP: 130/75 125/63 123/60 132/68  Pulse: 76 72 65 74  Resp: 20 20 16 16   Temp: 97.7 F (36.5 C) 98.3 F (36.8 C) 98.4 F (36.9 C) 98.2 F (36.8 C)  TempSrc:  Oral Oral Oral  SpO2: 100% 100% 100% 100%  Weight:      Height:        Intake/Output Summary (Last 24  hours) at 11/30/2019 1523 Last data filed at 11/30/2019 0657 Gross per 24 hour  Intake 480 ml  Output 2000 ml  Net -1520 ml   Filed Weights   11/27/19 0452 11/28/19 0500 11/29/19 0408  Weight: 97.1 kg 97.9 kg 97.9 kg    Examination:  GENERAL: No apparent distress.  Nontoxic. HEENT: MMM.  Vision and hearing grossly intact.  NECK: Supple.  No apparent JVD.  RESP:  No IWOB.  Fair aeration bilaterally. CVS:  RRR. Heart sounds normal.  ABD/GI/GU: BS+. Abd soft, NTND.  MSK/EXT:  Moves extremities. No apparent deformity.  Trace edema bilaterally. SKIN: Dressing over RLE DCI. NEURO: Awake, alert and oriented x4 including situation.  No apparent focal neuro deficit. PSYCH: Somewhat irritable and angry.  Procedures:  None  Microbiology summarized: 10/28-Limited RVP nonreactive. 10/29-MRSA PCR positive. 10/28-superficial wound culture with multiple species 10/28-blood cultures NGTD. 11/9-superficial wound culture NGTD.  Assessment & Plan: Septic shock secondary to severe right lower extremity purulent cellulitis-completed antibiotic course on 11/7.  Clinically improved.  Has been afebrile.  Hemodynamically stable. Some leukocytosis on 11/9 after initial improvement.  Due to steroid? -Infectious disease reconsulted due to leukocytosis.  Repeat superficial wound culture as above. -Recheck CBC with differential if he allows blood draw -Follow ID recommendations.  Leukocytosis:  WBC 9.5>>18.5k on 11/9.  Afebrile.  Hemodynamically stable.  No obvious source.  Repeat wound culture NGTD.  He is on steroid taper after septic shock. -Appreciate help  by infectious disease-monitor off antibiotics  Wide-complex tachycardia/sinus node dysfunction/paroxysmal A. Fib: CHA2DS2-VASc score 0.  Evaluated by cardiology neurology during this hospitalization.  He has intermittent sinus pauses but not symptomatic.  Hemodynamically stable.  He is not on nodal blocking agents. -Discussed with cardiology on  call-no further intervention.  No indication for pacemaker. -Optimize electrolytes  Acute metabolic encephalopathy: Likely due to sepsis and renal failure.  Resolved. -Reorientation and delirium precautions  Hypotension: Resolved. -Continue midodrine  ESRD on HD MWF -Per nephrology.  Refuses to go to HD here. He says he is unhappy with HD staff  S/p renal transplant: On chronic prednisone. -Resume home prednisone after taper  Anemia of renal disease: Stable. -Per nephrology  Chronic pain syndrome -Continue current regimen.  GERD: -Continue PPI  Noncompliance: -Encouraged and counseled.  Debility/physical deconditioning: -PT/OT recommended CIR.  Body mass index is 27.71 kg/m.        Pressure skin injury: Stage I. Pressure Injury 11/29/19 Heel Right Deep Tissue Pressure Injury - Purple or maroon localized area of discolored intact skin or blood-filled blister due to damage of underlying soft tissue from pressure and/or shear. (Active)  11/29/19 0945  Location: Heel  Location Orientation: Right  Staging: Deep Tissue Pressure Injury - Purple or maroon localized area of discolored intact skin or blood-filled blister due to damage of underlying soft tissue from pressure and/or shear.  Wound Description (Comments):   Present on Admission: No   DVT prophylaxis:  heparin injection 5,000 Units Start: 11/17/19 2200  Code Status: Full code. Family Communication: Patient and/or RN.  Attempted to call patient's daughter but no answer. Status is: Inpatient  Remains inpatient appropriate because:Unsafe d/c plan   Dispo: The patient is from: Home              Anticipated d/c is to: CIR              Anticipated d/c date is: 1 day              Patient currently is medically stable to d/c.       Consultants:  Nephrology PCCM Chest disease   Sch Meds:  Scheduled Meds: . aspirin EC  81 mg Oral Daily  . Chlorhexidine Gluconate Cloth  6 each Topical Daily  .  collagenase   Topical Daily  . epoetin (EPOGEN/PROCRIT) injection  10,000 Units Intravenous Q M,W,F-HD  . feeding supplement  237 mL Oral TID BM  . ferrous sulfate  325 mg Oral BID  . heparin  5,000 Units Subcutaneous Q8H  . mouth rinse  15 mL Mouth Rinse BID  . midodrine  5 mg Oral TID  . pantoprazole  40 mg Oral BID  . potassium chloride  40 mEq Oral Once  . [START ON 12/01/2019] predniSONE  10 mg Oral Q breakfast  . sodium chloride flush  3 mL Intravenous Q12H   Continuous Infusions: . sodium chloride 250 mL (11/27/19 2105)  . sodium chloride Stopped (11/23/19 1105)   PRN Meds:.sodium chloride, acetaminophen **OR** acetaminophen, atropine, dextrose, HYDROcodone-acetaminophen, HYDROmorphone (DILAUDID) injection, loperamide, melatonin, ondansetron (ZOFRAN) IV, sodium chloride, sodium chloride flush, traZODone  Antimicrobials: Anti-infectives (From admission, onward)   Start     Dose/Rate Route Frequency Ordered Stop   11/25/19 2200  ceFAZolin (ANCEF) IVPB 1 g/50 mL premix        1 g 100 mL/hr over 30 Minutes Intravenous Every 12 hours 11/25/19 1426 11/27/19 2137   11/23/19 1800  ceFAZolin (ANCEF) IVPB 1 g/50 mL premix  Status:  Discontinued        1 g 100 mL/hr over 30 Minutes Intravenous Every 24 hours 11/23/19 1222 11/25/19 1426   11/22/19 2200  ceFAZolin (ANCEF) IVPB 2g/100 mL premix  Status:  Discontinued        2 g 200 mL/hr over 30 Minutes Intravenous Every 12 hours 11/22/19 1136 11/23/19 1222   11/19/19 2000  vancomycin (VANCOCIN) IVPB 1000 mg/200 mL premix  Status:  Discontinued        1,000 mg 200 mL/hr over 60 Minutes Intravenous Every 24 hours 11/19/19 1422 11/21/19 1107   11/19/19 1800  ceFEPIme (MAXIPIME) 2 g in sodium chloride 0.9 % 100 mL IVPB  Status:  Discontinued        2 g 200 mL/hr over 30 Minutes Intravenous Every 12 hours 11/19/19 1422 11/22/19 1135   11/19/19 1200  vancomycin (VANCOCIN) IVPB 1000 mg/200 mL premix  Status:  Discontinued        1,000 mg 200  mL/hr over 60 Minutes Intravenous Every T-Th-Sa (Hemodialysis) 11/17/19 1836 11/18/19 1444   11/18/19 2000  ceFEPIme (MAXIPIME) 2 g in sodium chloride 0.9 % 100 mL IVPB  Status:  Discontinued        2 g 200 mL/hr over 30 Minutes Intravenous Every 24 hours 11/18/19 0154 11/18/19 0858   11/18/19 2000  ceFEPIme (MAXIPIME) 1 g in sodium chloride 0.9 % 100 mL IVPB  Status:  Discontinued        1 g 200 mL/hr over 30 Minutes Intravenous Every 24 hours 11/18/19 0858 11/19/19 1422   11/18/19 1500  cefTRIAXone (ROCEPHIN) 1 g in sodium chloride 0.9 % 100 mL IVPB  Status:  Discontinued        1 g 200 mL/hr over 30 Minutes Intravenous Every 24 hours 11/17/19 1800 11/17/19 2207   11/18/19 1442  vancomycin variable dose per unstable renal function (pharmacist dosing)  Status:  Discontinued         Does not apply See admin instructions 11/18/19 1444 11/19/19 1422   11/17/19 2245  ceFEPIme (MAXIPIME) 2 g in sodium chloride 0.9 % 100 mL IVPB        2 g 200 mL/hr over 30 Minutes Intravenous  Once 11/17/19 2214 11/18/19 0156   11/17/19 2200  clindamycin (CLEOCIN) IVPB 600 mg  Status:  Discontinued        600 mg 100 mL/hr over 30 Minutes Intravenous Every 8 hours 11/17/19 2011 11/18/19 2013   11/17/19 2000  vancomycin (VANCOREADY) IVPB 750 mg/150 mL  Status:  Discontinued        750 mg 150 mL/hr over 60 Minutes Intravenous Every 24 hours 11/17/19 1824 11/17/19 1832   11/17/19 1545  piperacillin-tazobactam (ZOSYN) IVPB 3.375 g  Status:  Discontinued        3.375 g 100 mL/hr over 30 Minutes Intravenous  Once 11/17/19 1535 11/17/19 1544   11/17/19 1545  clindamycin (CLEOCIN) IVPB 900 mg  Status:  Discontinued        900 mg 100 mL/hr over 30 Minutes Intravenous  Once 11/17/19 1535 11/17/19 1544   11/17/19 1500  vancomycin (VANCOREADY) IVPB 1250 mg/250 mL  Status:  Discontinued        1,250 mg 166.7 mL/hr over 90 Minutes Intravenous  Once 11/17/19 1410 11/17/19 1411   11/17/19 1415  vancomycin (VANCOCIN) IVPB  1000 mg/200 mL premix        1,000 mg 200 mL/hr over 60 Minutes Intravenous  Once 11/17/19 1401 11/17/19 1825  11/17/19 1415  cefTRIAXone (ROCEPHIN) 2 g in sodium chloride 0.9 % 100 mL IVPB        2 g 200 mL/hr over 30 Minutes Intravenous  Once 11/17/19 1401 11/17/19 1558       I have personally reviewed the following labs and images: CBC: Recent Labs  Lab 11/24/19 0545 11/25/19 0112 11/26/19 0544 11/27/19 0632 11/28/19 1037  WBC 15.9* 16.3* 11.7* 9.5 18.5*  NEUTROABS  --   --  9.2*  --   --   HGB 7.9* 9.0* 9.5* 9.7* 10.1*  HCT 24.9* 27.6* 29.8* 31.8* 31.3*  MCV 90.9 90.8 91.7 95.5 92.1  PLT 133* 165 158 148* 196   BMP &GFR Recent Labs  Lab 11/25/19 0112 11/26/19 0544 11/27/19 0632 11/28/19 1037 11/29/19 1646  NA 134* 133* 137 136 138  K 3.7  3.7 5.0 5.2* 5.0 3.5  CL 98 96* 101 98 99  CO2 25 24 22 24 27   GLUCOSE 110* 97 94 99 99  BUN 34* 50* 41* 55* 26*  CREATININE 3.38* 4.66* 4.43* 5.51* 3.38*  CALCIUM 9.9 9.9 9.4 9.6 8.9  MG 2.3 2.4 2.2 2.3 1.8  PHOS 3.7 5.2* 5.6* 6.2* 2.8   Estimated Creatinine Clearance: 30.4 mL/min (A) (by C-G formula based on SCr of 3.38 mg/dL (H)). Liver & Pancreas: Recent Labs  Lab 11/25/19 0112 11/26/19 0544 11/27/19 0632 11/28/19 1037 11/29/19 1646  ALBUMIN 2.8* 2.8* 2.8* 2.9* 2.7*   No results for input(s): LIPASE, AMYLASE in the last 168 hours. No results for input(s): AMMONIA in the last 168 hours. Diabetic: No results for input(s): HGBA1C in the last 72 hours. No results for input(s): GLUCAP in the last 168 hours. Cardiac Enzymes: No results for input(s): CKTOTAL, CKMB, CKMBINDEX, TROPONINI in the last 168 hours. No results for input(s): PROBNP in the last 8760 hours. Coagulation Profile: No results for input(s): INR, PROTIME in the last 168 hours. Thyroid Function Tests: No results for input(s): TSH, T4TOTAL, FREET4, T3FREE, THYROIDAB in the last 72 hours. Lipid Profile: No results for input(s): CHOL, HDL, LDLCALC,  TRIG, CHOLHDL, LDLDIRECT in the last 72 hours. Anemia Panel: No results for input(s): VITAMINB12, FOLATE, FERRITIN, TIBC, IRON, RETICCTPCT in the last 72 hours. Urine analysis: No results found for: COLORURINE, APPEARANCEUR, LABSPEC, PHURINE, GLUCOSEU, HGBUR, BILIRUBINUR, KETONESUR, PROTEINUR, UROBILINOGEN, NITRITE, LEUKOCYTESUR Sepsis Labs: Invalid input(s): PROCALCITONIN, [place]  Microbiology: Recent Results (from the past 240 hour(s))  Resp Panel by RT PCR (RSV, Flu A&B, Covid) - Nasopharyngeal Swab     Status: None   Collection Time: 11/29/19  2:13 PM   Specimen: Nasopharyngeal Swab  Result Value Ref Range Status   SARS Coronavirus 2 by RT PCR NEGATIVE NEGATIVE Final    Comment: (NOTE) SARS-CoV-2 target nucleic acids are NOT DETECTED.  The SARS-CoV-2 RNA is generally detectable in upper respiratoy specimens during the acute phase of infection. The lowest concentration of SARS-CoV-2 viral copies this assay can detect is 131 copies/mL. A negative result does not preclude SARS-Cov-2 infection and should not be used as the sole basis for treatment or other patient management decisions. A negative result may occur with  improper specimen collection/handling, submission of specimen other than nasopharyngeal swab, presence of viral mutation(s) within the areas targeted by this assay, and inadequate number of viral copies (<131 copies/mL). A negative result must be combined with clinical observations, patient history, and epidemiological information. The expected result is Negative.  Fact Sheet for Patients:  PinkCheek.be  Fact Sheet for Healthcare Providers:  GravelBags.it  This test is no t yet approved or cleared by the [place] and  has been authorized for detection and/or diagnosis of SARS-CoV-2 by FDA under an Emergency Use Authorization (EUA). This EUA will remain  in effect (meaning this test can be  used) for the duration of the COVID-19 declaration under Section 564(b)(1) of the Act, 21 U.S.C. section 360bbb-3(b)(1), unless the authorization is terminated or revoked sooner.     Influenza A by PCR NEGATIVE NEGATIVE Final   Influenza B by PCR NEGATIVE NEGATIVE Final    Comment: (NOTE) The Xpert Xpress SARS-CoV-2/FLU/RSV assay is intended as an aid in  the diagnosis of influenza from Nasopharyngeal swab specimens and  should not be used as a sole basis for treatment. Nasal washings and  aspirates are unacceptable for Xpert Xpress SARS-CoV-2/FLU/RSV  testing.  Fact Sheet for Patients: PinkCheek.be  Fact Sheet for Healthcare Providers: GravelBags.it  This test is not yet approved or cleared by the [place] FDA and  has been authorized for detection and/or diagnosis of SARS-CoV-2 by  FDA under an Emergency Use Authorization (EUA). This EUA will remain  in effect (meaning this test can be used) for the duration of the  Covid-19 declaration under Section 564(b)(1) of the Act, 21  U.S.C. section 360bbb-3(b)(1), unless the authorization is  terminated or revoked.    Respiratory Syncytial Virus by PCR NEGATIVE NEGATIVE Final    Comment: (NOTE) Fact Sheet for Patients: PinkCheek.be  Fact Sheet for Healthcare Providers: GravelBags.it  This test is not yet approved or cleared by the [place] FDA and  has been authorized for detection and/or diagnosis of SARS-CoV-2 by  FDA under an Emergency Use Authorization (EUA). This EUA will remain  in effect (meaning this test can be used) for the duration of the  COVID-19 declaration under Section 564(b)(1) of the Act, 21 U.S.C.  section 360bbb-3(b)(1), unless the authorization is terminated or  revoked. Performed at [facility], [address] [place], [place] [address]   Aerobic Culture (superficial  specimen)     Status: None (Preliminary result)   Collection Time: 11/29/19  5:23 PM   Specimen: Wound  Result Value Ref Range Status   Specimen Description   Final    WOUND Performed at [facility], [address] [place], [place] [address]    Special Requests   Final    RIGHT LEG Performed at [facility], [address] [place], [place] [address]    Gram Stain   Final    FEW WBC PRESENT, PREDOMINANTLY PMN RARE GRAM POSITIVE COCCI    Culture   Final    NO GROWTH < 12 HOURS Performed at [facility], [place] [address] [place], [place] [address]    Report Status PENDING  Incomplete    Radiology Studies: No results found.   Sahiti Joswick T. [place]  If 7PM-7AM, please contact night-coverage [email or web address] 11/30/2019, 3:23 PM

## 2019-11-30 NOTE — TOC Progression Note (Signed)
Transition of Care [facility]) - Progression Note    Patient Details  Name: [name] MRN: [id number] Date of Birth: [birth year]  Transition of Care [facility]) CM/SW [place], RN Phone Number: 11/30/2019, 3:39 PM  Clinical Narrative:     Received bed offer from [facility], they would like to see updated documentation. Updated information faxed to [facility] at [facility] fax # [phone].  Waiting to here back from [facility] about admission.  When accepted:  [facility] inpatient rehab Rm [id number]   Address: [address] [place], [place] [address]  Patient is frustrated he is not leaving today, but explained to patient he will need dialysis in the morning before transport.  Patient agreeable to stay at this time.   Expected Discharge Plan: [place] Barriers to Discharge: Continued Medical Work up  Expected Discharge Plan and Services Expected Discharge Plan: [place]   Discharge Planning Services: CM Consult Post Acute Care Choice: [place], [place] Living arrangements for the past 2 months: Single Family Home                                       Social Determinants of Health (SDOH) Interventions    Readmission Risk Interventions Readmission Risk Prevention Plan 11/24/2019  Transportation Screening Complete  PCP or Specialist Appt within 3-5 Days Complete  HRI or [place] Complete  Social Work Consult for [place] Planning/Counseling Complete  Palliative Care Screening Not Applicable  Medication Review Press photographer) Referral to Pharmacy  Some recent data might be hidden

## 2019-11-30 NOTE — Progress Notes (Signed)
Patient leaving AMA contacted MD, MD came to bedside explained to pt the risk of him leaving pt refused to stay, pt Alert and oriented pt IV removed, patient signed AMA and walking self out

## 2019-12-01 DIAGNOSIS — L089 Local infection of the skin and subcutaneous tissue, unspecified: Secondary | ICD-10-CM

## 2019-12-01 DIAGNOSIS — S81801A Unspecified open wound, right lower leg, initial encounter: Secondary | ICD-10-CM

## 2019-12-01 LAB — RENAL FUNCTION PANEL
Albumin: 2.7 g/dL — ABNORMAL LOW (ref 3.5–5.0)
Anion gap: 15 (ref 5–15)
BUN: 42 mg/dL — ABNORMAL HIGH (ref 6–20)
CO2: 25 mmol/L (ref 22–32)
Calcium: 9.1 mg/dL (ref 8.9–10.3)
Chloride: 99 mmol/L (ref 98–111)
Creatinine, Ser: 5.06 mg/dL — ABNORMAL HIGH (ref 0.61–1.24)
GFR, Estimated: 13 mL/min — ABNORMAL LOW (ref >60)
Glucose, Bld: 75 mg/dL (ref 70–99)
Phosphorus: 4.4 mg/dL (ref 2.5–4.6)
Potassium: 4.4 mmol/L (ref 3.5–5.1)
Sodium: 139 mmol/L (ref 135–145)

## 2019-12-01 LAB — CBC
HCT: 31.3 % — ABNORMAL LOW (ref 39.0–52.0)
Hemoglobin: 9.7 g/dL — ABNORMAL LOW (ref 13.0–17.0)
MCH: 29.8 pg (ref 26.0–34.0)
MCHC: 31 g/dL (ref 30.0–36.0)
MCV: 96 fL (ref 80.0–100.0)
Platelets: 133 10*3/uL — ABNORMAL LOW (ref 150–400)
RBC: 3.26 MIL/uL — ABNORMAL LOW (ref 4.22–5.81)
RDW: 21 % — ABNORMAL HIGH (ref 11.5–15.5)
WBC: 21.8 10*3/uL — ABNORMAL HIGH (ref 4.0–10.5)
nRBC: 0.2 % (ref 0.0–0.2)

## 2019-12-01 LAB — MAGNESIUM: Magnesium: 1.8 mg/dL (ref 1.7–2.4)

## 2019-12-01 MED ORDER — EPOETIN ALFA 10000 UNIT/ML IJ SOLN: 10000.0000 [IU] | INTRAMUSCULAR | Status: DC

## 2019-12-01 MED ORDER — MIDODRINE HCL 5 MG PO TABS
5.0000 mg | ORAL_TABLET | Freq: Three times a day (TID) | ORAL | Status: DC
Start: 2019-12-01 — End: 2019-12-17

## 2019-12-01 MED ORDER — SODIUM CHLORIDE 0.9 % IV SOLN
2.0000 g | Freq: Once | INTRAVENOUS | Status: AC
  Administered 2019-12-01: 2 g via INTRAVENOUS
  Filled 2019-12-01 (×2): qty 2

## 2019-12-01 MED ORDER — NEPRO/CARBSTEADY PO LIQD: 237.0000 mL | Freq: Two times a day (BID) | ORAL | Status: DC

## 2019-12-01 MED ORDER — NEPRO/CARBSTEADY PO LIQD
237.0000 mL | Freq: Two times a day (BID) | ORAL | 0 refills | Status: DC
Start: 2019-12-01 — End: 2020-03-27

## 2019-12-01 MED ORDER — SODIUM CHLORIDE 0.9 % IV SOLN: 1.0000 g | INTRAVENOUS | Status: DC

## 2019-12-01 MED ORDER — RENA-VITE PO TABS
1.0000 | ORAL_TABLET | Freq: Every day | ORAL | Status: DC
  Filled 2019-12-01 (×5): qty 1

## 2019-12-01 MED ORDER — RENA-VITE PO TABS
1.0000 | ORAL_TABLET | Freq: Every day | ORAL | 0 refills | Status: DC
Start: 2019-12-01 — End: 2020-03-27

## 2019-12-01 NOTE — Progress Notes (Signed)
ID Pt says his rt leg hurts Had refused labs because of difficulty in getting veins . Agrees only to be done during dilaysis WBC was scheduled for 2 days was not done and today the WBC is still high and c limbing No fever  O/e Patient Vitals for the past 24 hrs:  BP Temp Temp src Pulse Resp SpO2  12/01/19 1704 (!) 104/53 98.5 F (36.9 C) Oral 81 19 --  12/01/19 1409 (!) 120/57 -- -- -- -- --  12/01/19 1315 (!) 120/59 -- -- -- -- --  12/01/19 1300 (!) 130/55 -- -- -- -- --  12/01/19 1230 119/60 -- -- -- -- --  12/01/19 1100 139/64 98.4 F (36.9 C) Oral 86 -- 96 %  12/01/19 0749 127/72 (!) 97.5 F (36.4 C) Oral 78 19 --  12/01/19 0550 128/67 98.7 F (37.1 C) Oral 64 15 100 %  11/30/19 2015 127/61 97.7 F (36.5 C) Oral 79 17 100 %    Awake and alert Left AVFistula- site fine Chest b/l air entry And soft Rt leg    Labs  CBC Latest Ref Rng & Units 12/01/2019 11/28/2019 11/27/2019  WBC 4.0 - 10.5 K/uL 21.8(H) 18.5(H) 9.5  Hemoglobin 13.0 - 17.0 g/dL 9.7(L) 10.1(L) 9.7(L)  Hematocrit 39 - 52 % 31.3(L) 31.3(L) 31.8(L)  Platelets 150 - 400 K/uL 133(L) 196 148(L)    CMP Latest Ref Rng & Units 12/01/2019 11/29/2019 11/28/2019  Glucose 70 - 99 mg/dL 75 99 99  BUN 6 - 20 mg/dL 42(H) 26(H) 55(H)  Creatinine 0.61 - 1.24 mg/dL 5.06(H) 3.38(H) 5.51(H)  Sodium 135 - 145 mmol/L 139 138 136  Potassium 3.5 - 5.1 mmol/L 4.4 3.5 5.0  Chloride 98 - 111 mmol/L 99 99 98  CO2 22 - 32 mmol/L 25 27 24   Calcium 8.9 - 10.3 mg/dL 9.1 8.9 9.6  Total Protein 6.5 - 8.1 g/dL - - -  Total Bilirubin 0.3 - 1.2 mg/dL - - -  Alkaline Phos 38 - 126 U/L - - -  AST 15 - 41 U/L - - -  ALT 0 - 44 U/L - - -    Micro 11/17/19 BC-NG 11/17/19- WC- multiple organisms 11/29/19- WC pseudomonas 11/18/19  MRSA nares PCR-+  B/l basilar infiltrate   Impression/Recommendation ? 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 He was in septic shock and was in the ICU. Had CRRT, the wound culture had gram neg rods but was not finalized due to multiple organisms present. He was treated with vanco, ceftriaxone, followed by vanco/ cefepime and clindamycin and then cefazolin for 4 days. The total duration of antibiotic was 10 days. HE also was cardioverted for wide complex Vtac Leucocytosis resolved and then shot up again  The wound did not look overtly infected but because of worsening leucocytosis and pseudomonas in the wound culture  will start ceftazidime. Follow WBC to see whether it is trending down   Discussed the management with the patient and Dr.Gonfa and care team

## 2019-12-01 NOTE — Progress Notes (Signed)
[facility]  ROUNDING NOTE   Subjective:    Patient seen in dialysis today, tolerating treatment well.   HEMODIALYSIS FLOWSHEET:  Blood Flow Rate (mL/min): 350 mL/min Arterial Pressure (mmHg): -120 mmHg Venous Pressure (mmHg): 180 mmHg Transmembrane Pressure (mmHg): 80 mmHg Ultrafiltration Rate (mL/min): 1170 mL/min Dialysate Flow Rate (mL/min): 600 ml/min Conductivity: Machine : 13.8 Conductivity: Machine : 13.8 Dialysis Fluid Bolus: Normal Saline Bolus Amount (mL): 200 mL         Objective:  Vital signs in last 24 hours:  Temp:  [97.5 F (36.4 C)-98.7 F (37.1 C)] 98.4 F (36.9 C) (11/11 1100) Pulse Rate:  [64-86] 86 (11/11 1100) Resp:  [15-19] 19 (11/11 0749) BP: (112-139)/(55-72) 130/55 (11/11 1300) SpO2:  [96 %-100 %] 96 % (11/11 1100)  Weight change:  Filed Weights   11/27/19 0452 11/28/19 0500 11/29/19 0408  Weight: 97.1 kg 97.9 kg 97.9 kg    Intake/Output: I/O last 3 completed shifts: In: 480 [P.O.:480] Out: 0    Intake/Output this shift:  No intake/output data recorded.  Physical Exam: General:  Resting in bed, receiving dialysis treatment  Head: Normocephalic, Atraumatic  Eyes: Sclerae and conjunctivae clear  Lungs:  Respirations even, unlabored,lungs clear  Heart: S1S2,no rubs or gallops,Irregular  Abdomen:  Soft, non tender,non distended  Extremities:  1+ peripheral edema.on left leg,Rt leg wrapped with dressing  Neurologic: mood swings +, speech clear, and appropriate  Skin: Rt leg with dressing clean,dry and intact  Access: Left AVG    Basic Metabolic Panel: Recent Labs  Lab 11/26/19 0544 11/26/19 0544 11/27/19 0632 11/27/19 0632 11/28/19 1037 11/29/19 1646 12/01/19 1100  NA 133*  --  137  --  136 138 139  K 5.0  --  5.2*  --  5.0 3.5 4.4  CL 96*  --  101  --  98 99 99  CO2 24  --  22  --  24 27 25   GLUCOSE 97  --  94  --  99 99 75  BUN 50*  --  41*  --  55* 26* 42*  CREATININE 4.66*  --  4.43*  --  5.51* 3.38*  5.06*  CALCIUM 9.9   < > 9.4   < > 9.6 8.9 9.1  MG 2.4  --  2.2  --  2.3 1.8 1.8  PHOS 5.2*  --  5.6*  --  6.2* 2.8 4.4   < > = values in this interval not displayed.    Liver Function Tests: Recent Labs  Lab 11/26/19 0544 11/27/19 0632 11/28/19 1037 11/29/19 1646 12/01/19 1100  ALBUMIN 2.8* 2.8* 2.9* 2.7* 2.7*   No results for input(s): LIPASE, AMYLASE in the last 168 hours. No results for input(s): AMMONIA in the last 168 hours.  CBC: Recent Labs  Lab 11/25/19 0112 11/26/19 0544 11/27/19 0632 11/28/19 1037 12/01/19 1100  WBC 16.3* 11.7* 9.5 18.5* 21.8*  NEUTROABS  --  9.2*  --   --   --   HGB 9.0* 9.5* 9.7* 10.1* 9.7*  HCT 27.6* 29.8* 31.8* 31.3* 31.3*  MCV 90.8 91.7 95.5 92.1 96.0  PLT 165 158 148* 196 133*    Cardiac Enzymes: No results for input(s): CKTOTAL, CKMB, CKMBINDEX, TROPONINI in the last 168 hours.  BNP: Invalid input(s): POCBNP  CBG: No results for input(s): GLUCAP in the last 168 hours.  Microbiology: Results for orders placed or performed during the hospital encounter of 11/17/19  Wound or Superficial Culture     Status: Abnormal  Collection Time: 11/17/19  2:03 PM   Specimen: Wound  Result Value Ref Range Status   Specimen Description   Final    WOUND Performed at [facility], [address], [place], [place] [address]    Special Requests   Final    RL Performed at [facility], [address] [place], [place] [address]    Gram Stain   Final    NO WBC SEEN ABUNDANT GRAM NEGATIVE RODS FEW GRAM POSITIVE COCCI Performed at [facility], [place] [address] [place], [place] [address]    Culture MULTIPLE ORGANISMS PRESENT, NONE PREDOMINANT (A)  Final   Report Status 11/20/2019 FINAL  Final  Respiratory Panel by RT PCR (Flu A&B, Covid) - Nasopharyngeal Swab     Status: None   Collection Time: 11/17/19  2:05 PM   Specimen: Nasopharyngeal Swab  Result Value Ref Range Status   SARS Coronavirus 2 by RT PCR  NEGATIVE NEGATIVE Final    Comment: (NOTE) SARS-CoV-2 target nucleic acids are NOT DETECTED.  The SARS-CoV-2 RNA is generally detectable in upper respiratoy specimens during the acute phase of infection. The lowest concentration of SARS-CoV-2 viral copies this assay can detect is 131 copies/mL. A negative result does not preclude SARS-Cov-2 infection and should not be used as the sole basis for treatment or other patient management decisions. A negative result may occur with  improper specimen collection/handling, submission of specimen other than nasopharyngeal swab, presence of viral mutation(s) within the areas targeted by this assay, and inadequate number of viral copies (<131 copies/mL). A negative result must be combined with clinical observations, patient history, and epidemiological information. The expected result is Negative.  Fact Sheet for Patients:  PinkCheek.be  Fact Sheet for Healthcare Providers:  GravelBags.it  This test is no t yet approved or cleared by the [place] FDA and  has been authorized for detection and/or diagnosis of SARS-CoV-2 by FDA under an Emergency Use Authorization (EUA). This EUA will remain  in effect (meaning this test can be used) for the duration of the COVID-19 declaration under Section 564(b)(1) of the Act, 21 U.S.C. section 360bbb-3(b)(1), unless the authorization is terminated or revoked sooner.     Influenza A by PCR NEGATIVE NEGATIVE Final   Influenza B by PCR NEGATIVE NEGATIVE Final    Comment: (NOTE) The Xpert Xpress SARS-CoV-2/FLU/RSV assay is intended as an aid in  the diagnosis of influenza from Nasopharyngeal swab specimens and  should not be used as a sole basis for treatment. Nasal washings and  aspirates are unacceptable for Xpert Xpress SARS-CoV-2/FLU/RSV  testing.  Fact Sheet for Patients: PinkCheek.be  Fact Sheet for  Healthcare Providers: GravelBags.it  This test is not yet approved or cleared by the [place] FDA and  has been authorized for detection and/or diagnosis of SARS-CoV-2 by  FDA under an Emergency Use Authorization (EUA). This EUA will remain  in effect (meaning this test can be used) for the duration of the  Covid-19 declaration under Section 564(b)(1) of the Act, 21  U.S.C. section 360bbb-3(b)(1), unless the authorization is  terminated or revoked. Performed at [facility], [address] [place], [place] [address]   Blood Culture (routine x 2)     Status: None   Collection Time: 11/17/19  2:06 PM   Specimen: BLOOD  Result Value Ref Range Status   Specimen Description BLOOD BLOOD RIGHT HAND  Final   Special Requests   Final    BOTTLES DRAWN AEROBIC  AND ANAEROBIC Blood Culture results may not be optimal due to an inadequate volume of blood received in culture bottles   Culture   Final    NO GROWTH 5 DAYS Performed at [facility], [address] [place], [place] [address]    Report Status 11/22/2019 FINAL  Final  Blood Culture (routine x 2)     Status: None   Collection Time: 11/17/19  2:06 PM   Specimen: BLOOD  Result Value Ref Range Status   Specimen Description BLOOD BLOOD RIGHT HAND  Final   Special Requests   Final    BOTTLES DRAWN AEROBIC AND ANAEROBIC Blood Culture adequate volume   Culture   Final    NO GROWTH 5 DAYS Performed at [facility], [address], [place], [place] [address]    Report Status 11/22/2019 FINAL  Final  MRSA PCR Screening     Status: Abnormal   Collection Time: 11/18/19  1:03 AM   Specimen: Nasopharyngeal  Result Value Ref Range Status   MRSA by PCR POSITIVE (A) NEGATIVE Corrected    Comment:        The GeneXpert MRSA Assay (FDA approved for NASAL specimens only), is one component of a comprehensive MRSA colonization surveillance program. It is not intended to diagnose  MRSA infection nor to guide or monitor treatment for MRSA infections. RESULT CALLED TO, READ BACK BY AND VERIFIED WITH: CYNTHIA VASQUEZ @0245  ON 11/18/19 SKL Performed at [facility], [address] [place], [place] [address] CORRECTED ON 10/29 AT 0249: PREVIOUSLY REPORTED AS POSITIVE        The GeneXpert MRSA Assay (FDA approved for NASAL specimens only), is one component of a comprehensive MRSA colonization surveillance program. It is not intended to diagnose MRSA infection  nor to guide or monitor treatment for MRSA infections. CYNTHIA VASQUEZ @0245  ON 11/18/19 SKL   Resp Panel by RT PCR (RSV, Flu A&B, Covid) - Nasopharyngeal Swab     Status: None   Collection Time: 11/29/19  2:13 PM   Specimen: Nasopharyngeal Swab  Result Value Ref Range Status   SARS Coronavirus 2 by RT PCR NEGATIVE NEGATIVE Final    Comment: (NOTE) SARS-CoV-2 target nucleic acids are NOT DETECTED.  The SARS-CoV-2 RNA is generally detectable in upper respiratoy specimens during the acute phase of infection. The lowest concentration of SARS-CoV-2 viral copies this assay can detect is 131 copies/mL. A negative result does not preclude SARS-Cov-2 infection and should not be used as the sole basis for treatment or other patient management decisions. A negative result may occur with  improper specimen collection/handling, submission of specimen other than nasopharyngeal swab, presence of viral mutation(s) within the areas targeted by this assay, and inadequate number of viral copies (<131 copies/mL). A negative result must be combined with clinical observations, patient history, and epidemiological information. The expected result is Negative.  Fact Sheet for Patients:  PinkCheek.be  Fact Sheet for Healthcare Providers:  GravelBags.it  This test is no t yet approved or cleared by the [place] FDA and  has been authorized for detection  and/or diagnosis of SARS-CoV-2 by FDA under an Emergency Use Authorization (EUA). This EUA will remain  in effect (meaning this test can be used) for the duration of the COVID-19 declaration under Section 564(b)(1) of the Act, 21 U.S.C. section 360bbb-3(b)(1), unless the authorization is terminated or revoked sooner.     Influenza A by PCR NEGATIVE NEGATIVE Final   Influenza B by PCR NEGATIVE NEGATIVE Final  Comment: (NOTE) The Xpert Xpress SARS-CoV-2/FLU/RSV assay is intended as an aid in  the diagnosis of influenza from Nasopharyngeal swab specimens and  should not be used as a sole basis for treatment. Nasal washings and  aspirates are unacceptable for Xpert Xpress SARS-CoV-2/FLU/RSV  testing.  Fact Sheet for Patients: PinkCheek.be  Fact Sheet for Healthcare Providers: GravelBags.it  This test is not yet approved or cleared by the [place] FDA and  has been authorized for detection and/or diagnosis of SARS-CoV-2 by  FDA under an Emergency Use Authorization (EUA). This EUA will remain  in effect (meaning this test can be used) for the duration of the  Covid-19 declaration under Section 564(b)(1) of the Act, 21  U.S.C. section 360bbb-3(b)(1), unless the authorization is  terminated or revoked.    Respiratory Syncytial Virus by PCR NEGATIVE NEGATIVE Final    Comment: (NOTE) Fact Sheet for Patients: PinkCheek.be  Fact Sheet for Healthcare Providers: GravelBags.it  This test is not yet approved or cleared by the [place] FDA and  has been authorized for detection and/or diagnosis of SARS-CoV-2 by  FDA under an Emergency Use Authorization (EUA). This EUA will remain  in effect (meaning this test can be used) for the duration of the  COVID-19 declaration under Section 564(b)(1) of the Act, 21 U.S.C.  section 360bbb-3(b)(1), unless the authorization is  terminated or  revoked. Performed at [facility], [address] [place], [place] [address]   Aerobic Culture (superficial specimen)     Status: None (Preliminary result)   Collection Time: 11/29/19  5:23 PM   Specimen: Wound  Result Value Ref Range Status   Specimen Description   Final    WOUND Performed at [facility], [address], [place], [place] [address]    Special Requests   Final    RIGHT LEG Performed at [facility], [address] [place], [place] [address]    Gram Stain   Final    FEW WBC PRESENT, PREDOMINANTLY PMN RARE GRAM POSITIVE COCCI    Culture   Final    RARE PSEUDOMONAS AERUGINOSA SUSCEPTIBILITIES TO FOLLOW Performed at [facility], [place] [address], [place], [place] [address]    Report Status PENDING  Incomplete    Coagulation Studies: No results for input(s): LABPROT, INR in the last 72 hours.  Urinalysis: No results for input(s): COLORURINE, LABSPEC, PHURINE, GLUCOSEU, HGBUR, BILIRUBINUR, KETONESUR, PROTEINUR, UROBILINOGEN, NITRITE, LEUKOCYTESUR in the last 72 hours.  Invalid input(s): APPERANCEUR    Imaging: No results found.   Medications:   . sodium chloride 250 mL (11/27/19 2105)  . sodium chloride Stopped (11/23/19 1105)   . aspirin EC  81 mg Oral Daily  . Chlorhexidine Gluconate Cloth  6 each Topical Daily  . collagenase   Topical Daily  . epoetin (EPOGEN/PROCRIT) injection  10,000 Units Intravenous Q M,W,F-HD  . feeding supplement (NEPRO CARB STEADY)  237 mL Oral BID BM  . ferrous sulfate  325 mg Oral BID  . heparin  5,000 Units Subcutaneous Q8H  . mouth rinse  15 mL Mouth Rinse BID  . midodrine  5 mg Oral TID  . multivitamin  1 tablet Oral QHS  . pantoprazole  40 mg Oral BID  . potassium chloride  40 mEq Oral Once  . predniSONE  10 mg Oral Q breakfast  . sodium chloride flush  3 mL Intravenous Q12H   sodium chloride, acetaminophen **OR** acetaminophen, atropine, dextrose,  HYDROcodone-acetaminophen, HYDROmorphone (DILAUDID) injection, loperamide, melatonin,  ondansetron (ZOFRAN) IV, sodium chloride, sodium chloride flush, traZODone  Assessment/ Plan:  Mr. BRINTON BRANDEL is a 49 y.o. black male with end stage renal disease on hemodialysis, history of kidney transplant, lupus nephritis, and hypotension who was admitted to [facility] on 11/17/2019 for Cellulitis of right lower extremity [Q33.744] ESRD on hemodialysis ([place]) [N18.6, Z99.2] Hypotension [I95.9] Severe sepsis ([place]) [A41.9, R65.20]  [facility]/ [place]/ Left AVG/TTS  # End stage renal disease.  CRRT from 10/30-10/31, Then 11/1-11/2  Patient is receiving dialysis today Tolerating well Will continue TTS schedule  # Sepsis/hypotension with right lower extremity cellulitis   Patient treated with antibiotics for sepsis Prednisone tapering ID team managing care Blood pressure readings within acceptable range,continues to be on Midodrine   #Anemia with chronic kidney disease.   Lab Results  Component Value Date   HGB 9.7 (L) 12/01/2019   Epogen 10,000 units MWF   #Secondary Hyperparathyroidism Lab Results  Component Value Date   CALCIUM 9.1 12/01/2019   PHOS 4.4 12/01/2019   Will continue monitoring bone mineral metabolism parameters    LOS: 14 [name] 11/11/20211:18 PM

## 2019-12-01 NOTE — Progress Notes (Signed)
Patient in today for HD treatment to be completed. V/S taken and recorded and are noted to be WNL. Oxygen unable to sense oxygen saturation on the patients fingers or earlobe frequently. [place] at 2 LPM started per patient request during dialysis. 96% on RA prior to oxygen started. Patient refused to have sensor on his ears. No s/s of any respiratory distress noted. Left AVF with noted thrill and bruit prior to cannulation. 15 G needles utilized to access site, with no issues or concerns during cannulation. Treatment started and completed without any concerns. 3L x 3hrs during today's treatment. Dr. Candiss Norse at bedside prior to treatment started and new order received to decrease treatment time to 3 hrs per patient request.

## 2019-12-01 NOTE — Progress Notes (Signed)
PROGRESS NOTE  [name] KGY:[id number] DOB: March 17, 1970   PCP: Pcp, No  Patient is from: Home.  DOA: 11/17/2019 LOS: 63  Chief complaints: Right lower extremity pain and swelling with purulent discharge  Brief Narrative / Interim history: 49 year old male with history of ESRD on HD TTS, previous kidney transplant on prednisone chronically, anemia of renal disease, diverticulosis and GERD mated with right lower extremity cellulitis after he presented with right lower extremity pain, swelling with purulent discharge and associated fever and rigors.  While waiting on.  In ED, became hypotensive requiring pressors and ICU level of care for septic shock secondary to right lower extremity cellulitis.  Eventually stabilized and transferred to [facility] service on 11/23/2019.  He received different IV antibiotics from 10/28-11/7 which includes IV cefepime from 10/28-11/2.  Patient had leukocytosis after initial improvement.  Infectious disease reconsulted.  Superficial wound culture grew Pseudomonas.   Subjective: Seen and examined earlier this morning and later this afternoon.  No major events overnight of this morning.  Somewhat irritated and frustrated about delay in rehab transfer.  Continues to endorse pain in his right leg. No other complaints.  Denies chest pain, dyspnea, GI or UTI symptoms.  Objective: Vitals:   11/30/19 2015 12/01/19 0550 12/01/19 0749 12/01/19 1100  BP: 127/61 128/67 127/72 139/64  Pulse: 79 64 78 86  Resp: 17 15 19    Temp: 97.7 F (36.5 C) 98.7 F (37.1 C) (!) 97.5 F (36.4 C) 98.4 F (36.9 C)  TempSrc: Oral Oral Oral Oral  SpO2: 100% 100%  96%  Weight:      Height:        Intake/Output Summary (Last 24 hours) at 12/01/2019 1223 Last data filed at 11/30/2019 1607 Gross per 24 hour  Intake --  Output 0 ml  Net 0 ml   Filed Weights   11/27/19 0452 11/28/19 0500 11/29/19 0408  Weight: 97.1 kg 97.9 kg 97.9 kg    Examination:  GENERAL: No apparent  distress.  Nontoxic. HEENT: MMM.  Vision and hearing grossly intact.  NECK: Supple.  No apparent JVD.  RESP: On RA.  No IWOB.  Fair aeration bilaterally. CVS:  RRR. Heart sounds normal.  ABD/GI/GU: BS+. Abd soft, NTND.  MSK/EXT:  Moves extremities.  1+ edema bilaterally. SKIN: Clear looking skin wound over lateral aspect of right leg.  No facial discoloration, erythema or significant tenderness.  See picture below for more. NEURO: Awake, alert and oriented appropriately.  No apparent focal neuro deficit. PSYCH: Somewhat irritable     Procedures:  None  Microbiology summarized: 10/28-Limited RVP nonreactive. 10/29-MRSA PCR positive. 10/28-superficial wound culture with multiple species 10/28-blood cultures NGTD. 11/9-superficial wound culture with Pseudomonas.  Assessment & Plan: Septic shock secondary to severe right lower extremity cellulitis-CT right leg on 10/28 revealed diffuse and marked subcutaneous soft tissue swelling/edema/fluid suggesting severe cellulitis but no discrete drainable abscess.  Clinically improved.  Has been afebrile.  Hemodynamically stable.  However, patient has rising leukocytosis since 11/9.  Repeat superficial wound culture positive for Pseudomonas.  Wound appears clean without signs of abscess or drainable fluid on exam. -Infectious disease reconsulted due to leukocytosis.  -Follow ID recs: IV cefepime or IV ceftazidime with HD  Leukocytosis:  WBC 9.5>>18.5>>22.  Afebrile.  Hemodynamically stable.  No obvious source.  Repeat wound culture NGTD.  He is on steroid taper after septic shock. -Appreciate help by infectious disease-monitor off antibiotics  Wide-complex tachycardia/sinus node dysfunction/paroxysmal A. Fib: CHA2DS2-VASc score 0.  Evaluated by cardiology neurology during  this hospitalization.  He has intermittent sinus pauses as long as 6 secs but not symptomatic.  Hemodynamically stable.  He is not on nodal blocking agents. -Discussed with  cardiology, Dr. Garen Lah on 11/10-no further intervention.  No indication for pacemaker. -Optimize electrolytes  Acute metabolic encephalopathy: Likely due to sepsis and renal failure.  Resolved. -Reorientation and delirium precautions  Hypotension: Resolved. -Continue midodrine  ESRD on HD MWF -Per nephrology.  Received HD today.  S/p renal transplant: On chronic prednisone. -Continue home prednisone after taper  Anemia of renal disease: Stable. -Per nephrology  Chronic pain syndrome -Continue current regimen.  GERD: -Continue PPI  Noncompliance: -Encouraged and counseled.  Debility/physical deconditioning: -PT/OT recommended CIR.  Body mass index is 27.71 kg/m.        Pressure skin injury: Stage I. Pressure Injury 11/29/19 Heel Right Deep Tissue Pressure Injury - Purple or maroon localized area of discolored intact skin or blood-filled blister due to damage of underlying soft tissue from pressure and/or shear. (Active)  11/29/19 0945  Location: Heel  Location Orientation: Right  Staging: Deep Tissue Pressure Injury - Purple or maroon localized area of discolored intact skin or blood-filled blister due to damage of underlying soft tissue from pressure and/or shear.  Wound Description (Comments):   Present on Admission: No   DVT prophylaxis:  heparin injection 5,000 Units Start: 11/17/19 2200  Code Status: Full code. Family Communication: Patient and/or RN.  Attempted to call patient's daughter but no answer. Status is: Inpatient  Remains inpatient appropriate because:Unsafe d/c plan   Dispo: The patient is from: Home              Anticipated d/c is to: CIR              Anticipated d/c date is: 1 day              Patient currently is medically stable to d/c.       Consultants:  Nephrology PCCM-signed off Infectious disease   Sch Meds:  Scheduled Meds: . aspirin EC  81 mg Oral Daily  . Chlorhexidine Gluconate Cloth  6 each Topical Daily  .  collagenase   Topical Daily  . epoetin (EPOGEN/PROCRIT) injection  10,000 Units Intravenous Q M,W,F-HD  . feeding supplement (NEPRO CARB STEADY)  237 mL Oral BID BM  . ferrous sulfate  325 mg Oral BID  . heparin  5,000 Units Subcutaneous Q8H  . mouth rinse  15 mL Mouth Rinse BID  . midodrine  5 mg Oral TID  . multivitamin  1 tablet Oral QHS  . pantoprazole  40 mg Oral BID  . potassium chloride  40 mEq Oral Once  . predniSONE  10 mg Oral Q breakfast  . sodium chloride flush  3 mL Intravenous Q12H   Continuous Infusions: . sodium chloride 250 mL (11/27/19 2105)  . sodium chloride Stopped (11/23/19 1105)   PRN Meds:.sodium chloride, acetaminophen **OR** acetaminophen, atropine, dextrose, HYDROcodone-acetaminophen, HYDROmorphone (DILAUDID) injection, loperamide, melatonin, ondansetron (ZOFRAN) IV, sodium chloride, sodium chloride flush, traZODone  Antimicrobials: Anti-infectives (From admission, onward)   Start     Dose/Rate Route Frequency Ordered Stop   11/25/19 2200  ceFAZolin (ANCEF) IVPB 1 g/50 mL premix        1 g 100 mL/hr over 30 Minutes Intravenous Every 12 hours 11/25/19 1426 11/27/19 2137   11/23/19 1800  ceFAZolin (ANCEF) IVPB 1 g/50 mL premix  Status:  Discontinued        1 g 100 mL/hr over  30 Minutes Intravenous Every 24 hours 11/23/19 1222 11/25/19 1426   11/22/19 2200  ceFAZolin (ANCEF) IVPB 2g/100 mL premix  Status:  Discontinued        2 g 200 mL/hr over 30 Minutes Intravenous Every 12 hours 11/22/19 1136 11/23/19 1222   11/19/19 2000  vancomycin (VANCOCIN) IVPB 1000 mg/200 mL premix  Status:  Discontinued        1,000 mg 200 mL/hr over 60 Minutes Intravenous Every 24 hours 11/19/19 1422 11/21/19 1107   11/19/19 1800  ceFEPIme (MAXIPIME) 2 g in sodium chloride 0.9 % 100 mL IVPB  Status:  Discontinued        2 g 200 mL/hr over 30 Minutes Intravenous Every 12 hours 11/19/19 1422 11/22/19 1135   11/19/19 1200  vancomycin (VANCOCIN) IVPB 1000 mg/200 mL premix  Status:   Discontinued        1,000 mg 200 mL/hr over 60 Minutes Intravenous Every T-Th-Sa (Hemodialysis) 11/17/19 1836 11/18/19 1444   11/18/19 2000  ceFEPIme (MAXIPIME) 2 g in sodium chloride 0.9 % 100 mL IVPB  Status:  Discontinued        2 g 200 mL/hr over 30 Minutes Intravenous Every 24 hours 11/18/19 0154 11/18/19 0858   11/18/19 2000  ceFEPIme (MAXIPIME) 1 g in sodium chloride 0.9 % 100 mL IVPB  Status:  Discontinued        1 g 200 mL/hr over 30 Minutes Intravenous Every 24 hours 11/18/19 0858 11/19/19 1422   11/18/19 1500  cefTRIAXone (ROCEPHIN) 1 g in sodium chloride 0.9 % 100 mL IVPB  Status:  Discontinued        1 g 200 mL/hr over 30 Minutes Intravenous Every 24 hours 11/17/19 1800 11/17/19 2207   11/18/19 1442  vancomycin variable dose per unstable renal function (pharmacist dosing)  Status:  Discontinued         Does not apply See admin instructions 11/18/19 1444 11/19/19 1422   11/17/19 2245  ceFEPIme (MAXIPIME) 2 g in sodium chloride 0.9 % 100 mL IVPB        2 g 200 mL/hr over 30 Minutes Intravenous  Once 11/17/19 2214 11/18/19 0156   11/17/19 2200  clindamycin (CLEOCIN) IVPB 600 mg  Status:  Discontinued        600 mg 100 mL/hr over 30 Minutes Intravenous Every 8 hours 11/17/19 2011 11/18/19 2013   11/17/19 2000  vancomycin (VANCOREADY) IVPB 750 mg/150 mL  Status:  Discontinued        750 mg 150 mL/hr over 60 Minutes Intravenous Every 24 hours 11/17/19 1824 11/17/19 1832   11/17/19 1545  piperacillin-tazobactam (ZOSYN) IVPB 3.375 g  Status:  Discontinued        3.375 g 100 mL/hr over 30 Minutes Intravenous  Once 11/17/19 1535 11/17/19 1544   11/17/19 1545  clindamycin (CLEOCIN) IVPB 900 mg  Status:  Discontinued        900 mg 100 mL/hr over 30 Minutes Intravenous  Once 11/17/19 1535 11/17/19 1544   11/17/19 1500  vancomycin (VANCOREADY) IVPB 1250 mg/250 mL  Status:  Discontinued        1,250 mg 166.7 mL/hr over 90 Minutes Intravenous  Once 11/17/19 1410 11/17/19 1411   11/17/19  1415  vancomycin (VANCOCIN) IVPB 1000 mg/200 mL premix        1,000 mg 200 mL/hr over 60 Minutes Intravenous  Once 11/17/19 1401 11/17/19 1825   11/17/19 1415  cefTRIAXone (ROCEPHIN) 2 g in sodium chloride 0.9 % 100 mL IVPB  2 g 200 mL/hr over 30 Minutes Intravenous  Once 11/17/19 1401 11/17/19 1558       I have personally reviewed the following labs and images: CBC: Recent Labs  Lab 11/25/19 0112 11/26/19 0544 11/27/19 0632 11/28/19 1037 12/01/19 1100  WBC 16.3* 11.7* 9.5 18.5* 21.8*  NEUTROABS  --  9.2*  --   --   --   HGB 9.0* 9.5* 9.7* 10.1* 9.7*  HCT 27.6* 29.8* 31.8* 31.3* 31.3*  MCV 90.8 91.7 95.5 92.1 96.0  PLT 165 158 148* 196 133*   BMP &GFR Recent Labs  Lab 11/26/19 0544 11/27/19 0632 11/28/19 1037 11/29/19 1646 12/01/19 1100  NA 133* 137 136 138 139  K 5.0 5.2* 5.0 3.5 4.4  CL 96* 101 98 99 99  CO2 24 22 24 27 25   GLUCOSE 97 94 99 99 75  BUN 50* 41* 55* 26* 42*  CREATININE 4.66* 4.43* 5.51* 3.38* 5.06*  CALCIUM 9.9 9.4 9.6 8.9 9.1  MG 2.4 2.2 2.3 1.8 1.8  PHOS 5.2* 5.6* 6.2* 2.8 4.4   Estimated Creatinine Clearance: 20.3 mL/min (A) (by C-G formula based on SCr of 5.06 mg/dL (H)). Liver & Pancreas: Recent Labs  Lab 11/26/19 0544 11/27/19 0632 11/28/19 1037 11/29/19 1646 12/01/19 1100  ALBUMIN 2.8* 2.8* 2.9* 2.7* 2.7*   No results for input(s): LIPASE, AMYLASE in the last 168 hours. No results for input(s): AMMONIA in the last 168 hours. Diabetic: No results for input(s): HGBA1C in the last 72 hours. No results for input(s): GLUCAP in the last 168 hours. Cardiac Enzymes: No results for input(s): CKTOTAL, CKMB, CKMBINDEX, TROPONINI in the last 168 hours. No results for input(s): PROBNP in the last 8760 hours. Coagulation Profile: No results for input(s): INR, PROTIME in the last 168 hours. Thyroid Function Tests: No results for input(s): TSH, T4TOTAL, FREET4, T3FREE, THYROIDAB in the last 72 hours. Lipid Profile: No results for  input(s): CHOL, HDL, LDLCALC, TRIG, CHOLHDL, LDLDIRECT in the last 72 hours. Anemia Panel: No results for input(s): VITAMINB12, FOLATE, FERRITIN, TIBC, IRON, RETICCTPCT in the last 72 hours. Urine analysis: No results found for: COLORURINE, APPEARANCEUR, LABSPEC, PHURINE, GLUCOSEU, HGBUR, BILIRUBINUR, KETONESUR, PROTEINUR, UROBILINOGEN, NITRITE, LEUKOCYTESUR Sepsis Labs: Invalid input(s): PROCALCITONIN, [place]  Microbiology: Recent Results (from the past 240 hour(s))  Resp Panel by RT PCR (RSV, Flu A&B, Covid) - Nasopharyngeal Swab     Status: None   Collection Time: 11/29/19  2:13 PM   Specimen: Nasopharyngeal Swab  Result Value Ref Range Status   SARS Coronavirus 2 by RT PCR NEGATIVE NEGATIVE Final    Comment: (NOTE) SARS-CoV-2 target nucleic acids are NOT DETECTED.  The SARS-CoV-2 RNA is generally detectable in upper respiratoy specimens during the acute phase of infection. The lowest concentration of SARS-CoV-2 viral copies this assay can detect is 131 copies/mL. A negative result does not preclude SARS-Cov-2 infection and should not be used as the sole basis for treatment or other patient management decisions. A negative result may occur with  improper specimen collection/handling, submission of specimen other than nasopharyngeal swab, presence of viral mutation(s) within the areas targeted by this assay, and inadequate number of viral copies (<131 copies/mL). A negative result must be combined with clinical observations, patient history, and epidemiological information. The expected result is Negative.  Fact Sheet for Patients:  PinkCheek.be  Fact Sheet for Healthcare Providers:  GravelBags.it  This test is no t yet approved or cleared by the [place] FDA and  has been authorized for detection and/or  diagnosis of SARS-CoV-2 by FDA under an Emergency Use Authorization (EUA). This EUA will remain  in effect  (meaning this test can be used) for the duration of the COVID-19 declaration under Section 564(b)(1) of the Act, 21 U.S.C. section 360bbb-3(b)(1), unless the authorization is terminated or revoked sooner.     Influenza A by PCR NEGATIVE NEGATIVE Final   Influenza B by PCR NEGATIVE NEGATIVE Final    Comment: (NOTE) The Xpert Xpress SARS-CoV-2/FLU/RSV assay is intended as an aid in  the diagnosis of influenza from Nasopharyngeal swab specimens and  should not be used as a sole basis for treatment. Nasal washings and  aspirates are unacceptable for Xpert Xpress SARS-CoV-2/FLU/RSV  testing.  Fact Sheet for Patients: PinkCheek.be  Fact Sheet for Healthcare Providers: GravelBags.it  This test is not yet approved or cleared by the [place] FDA and  has been authorized for detection and/or diagnosis of SARS-CoV-2 by  FDA under an Emergency Use Authorization (EUA). This EUA will remain  in effect (meaning this test can be used) for the duration of the  Covid-19 declaration under Section 564(b)(1) of the Act, 21  U.S.C. section 360bbb-3(b)(1), unless the authorization is  terminated or revoked.    Respiratory Syncytial Virus by PCR NEGATIVE NEGATIVE Final    Comment: (NOTE) Fact Sheet for Patients: PinkCheek.be  Fact Sheet for Healthcare Providers: GravelBags.it  This test is not yet approved or cleared by the [place] FDA and  has been authorized for detection and/or diagnosis of SARS-CoV-2 by  FDA under an Emergency Use Authorization (EUA). This EUA will remain  in effect (meaning this test can be used) for the duration of the  COVID-19 declaration under Section 564(b)(1) of the Act, 21 U.S.C.  section 360bbb-3(b)(1), unless the authorization is terminated or  revoked. Performed at [facility], [address] [place], [place] [address]    Aerobic Culture (superficial specimen)     Status: None (Preliminary result)   Collection Time: 11/29/19  5:23 PM   Specimen: Wound  Result Value Ref Range Status   Specimen Description   Final    WOUND Performed at [facility], [address] [place], [place] [address]    Special Requests   Final    RIGHT LEG Performed at [facility], [address] [place], [place] [address]    Gram Stain   Final    FEW WBC PRESENT, PREDOMINANTLY PMN RARE GRAM POSITIVE COCCI    Culture   Final    RARE PSEUDOMONAS AERUGINOSA SUSCEPTIBILITIES TO FOLLOW Performed at [facility], [place] [address] [place], [place] [address]    Report Status PENDING  Incomplete    Radiology Studies: No results found.   Ajla Mcgeachy T. [place]  If 7PM-7AM, please contact night-coverage [email or web address] 12/01/2019, 12:23 PM

## 2019-12-01 NOTE — Plan of Care (Signed)
Pt resting in bed comfortably Received from dialysis and assisted with ordering meal tray New IV placed for IV antibiotics Patient refuses some medications and tele box but cooperative with care. Verbalizes an understanding of plan of care.  Patient pain treated with prn pain medications.  Denies any additional wants or needs at this time Call bell within reach Will continue to closely monitor.    Problem: Health Behavior/Discharge Planning: Goal: Ability to manage health-related needs will improve Outcome: Progressing   Problem: Clinical Measurements: Goal: Ability to maintain clinical measurements within normal limits will improve Outcome: Progressing Goal: Will remain free from infection Outcome: Progressing Goal: Diagnostic test results will improve Outcome: Progressing Goal: Cardiovascular complication will be avoided Outcome: Progressing   Problem: Activity: Goal: Risk for activity intolerance will decrease Outcome: Progressing   Problem: Nutrition: Goal: Adequate nutrition will be maintained Outcome: Progressing   Problem: Coping: Goal: Level of anxiety will decrease Outcome: Progressing   Problem: Elimination: Goal: Will not experience complications related to bowel motility Outcome: Progressing   Problem: Safety: Goal: Ability to remain free from injury will improve Outcome: Progressing   Problem: Skin Integrity: Goal: Risk for impaired skin integrity will decrease Outcome: Progressing   Problem: Fluid Volume: Goal: Hemodynamic stability will improve Outcome: Progressing   Problem: Clinical Measurements: Goal: Diagnostic test results will improve Outcome: Progressing Goal: Signs and symptoms of infection will decrease Outcome: Progressing   Problem: Respiratory: Goal: Ability to maintain adequate ventilation will improve Outcome: Progressing

## 2019-12-01 NOTE — Progress Notes (Signed)
Note request to update/inform Rehab per [place] CM.   Patient was in transition to another dialysis clinic prior to hospital admission. Patient was known at [facility] [phone], however upon discharge patient will start at [facility] is requesting a Covid test within 5 days of first treatment. Please contact Luanne [phone] or Whitman Hero [phone] at [facility] for more information regarding start date at this clinic.   [name] Dialysis Coordinator [phone]

## 2019-12-01 NOTE — Progress Notes (Signed)
Pt still refusing labs, some meds, IV, heart monitor.  He is waiting to go to rehab.

## 2019-12-01 NOTE — TOC Progression Note (Signed)
Transition of Care [facility]) - Progression Note    Patient Details  Name: IBRAHEM VOLKMAN MRN: [id number] Date of Birth: January 05, 1971  Transition of Care [facility]) CM/SW [place], RN Phone Number: 12/01/2019, 3:47 PM  Clinical Narrative:     [facility] Admissions Team wanting updated progress notes and labs for patient.    Due to abnormal lab values, patient may require more medical treatment before rehab admission.   Expected Discharge Plan: [place] Barriers to Discharge: Continued Medical Work up  Expected Discharge Plan and Services Expected Discharge Plan: [place]   Discharge Planning Services: CM Consult Post Acute Care Choice: [place], [place] Living arrangements for the past 2 months: Single Family Home Expected Discharge Date: 12/01/19                                     Social Determinants of Health (SDOH) Interventions    Readmission Risk Interventions Readmission Risk Prevention Plan 11/24/2019  Transportation Screening Complete  PCP or Specialist Appt within 3-5 Days Complete  HRI or [place] Complete  Social Work Consult for [place] Planning/Counseling Complete  Palliative Care Screening Not Applicable  Medication Review Press photographer) Referral to Pharmacy  Some recent data might be hidden

## 2019-12-02 ENCOUNTER — Encounter: Payer: No Typology Code available for payment source | Admitting: Registered Nurse

## 2019-12-02 LAB — CBC
HCT: 29.4 % — ABNORMAL LOW (ref 39.0–52.0)
Hemoglobin: 9 g/dL — ABNORMAL LOW (ref 13.0–17.0)
MCH: 30.1 pg (ref 26.0–34.0)
MCHC: 30.6 g/dL (ref 30.0–36.0)
MCV: 98.3 fL (ref 80.0–100.0)
Platelets: 108 10*3/uL — ABNORMAL LOW (ref 150–400)
RBC: 2.99 MIL/uL — ABNORMAL LOW (ref 4.22–5.81)
RDW: 21 % — ABNORMAL HIGH (ref 11.5–15.5)
WBC: 15.7 10*3/uL — ABNORMAL HIGH (ref 4.0–10.5)
nRBC: 0.2 % (ref 0.0–0.2)

## 2019-12-02 LAB — RENAL FUNCTION PANEL
Albumin: 2.6 g/dL — ABNORMAL LOW (ref 3.5–5.0)
Anion gap: 11 (ref 5–15)
BUN: 33 mg/dL — ABNORMAL HIGH (ref 6–20)
CO2: 29 mmol/L (ref 22–32)
Calcium: 9.1 mg/dL (ref 8.9–10.3)
Chloride: 97 mmol/L — ABNORMAL LOW (ref 98–111)
Creatinine, Ser: 4.69 mg/dL — ABNORMAL HIGH (ref 0.61–1.24)
GFR, Estimated: 14 mL/min — ABNORMAL LOW (ref >60)
Glucose, Bld: 80 mg/dL (ref 70–99)
Phosphorus: 4.3 mg/dL (ref 2.5–4.6)
Potassium: 4.6 mmol/L (ref 3.5–5.1)
Sodium: 137 mmol/L (ref 135–145)

## 2019-12-02 LAB — AEROBIC CULTURE W GRAM STAIN (SUPERFICIAL SPECIMEN)

## 2019-12-02 LAB — MAGNESIUM: Magnesium: 1.9 mg/dL (ref 1.7–2.4)

## 2019-12-02 MED ORDER — CIPROFLOXACIN HCL 500 MG PO TABS
250.0000 mg | ORAL_TABLET | Freq: Every day | ORAL | Status: DC
  Filled 2019-12-02: qty 0.5

## 2019-12-02 MED ORDER — SODIUM CHLORIDE 0.9 % IV SOLN
500.0000 mg | INTRAVENOUS | Status: DC
  Administered 2019-12-02 – 2019-12-04 (×3): 500 mg via INTRAVENOUS
  Filled 2019-12-02 (×2): qty 0.5
  Filled 2019-12-02: qty 500
  Filled 2019-12-02 (×2): qty 0.5

## 2019-12-02 NOTE — TOC Progression Note (Signed)
Transition of Care [facility]) - Progression Note    Patient Details  Name: OLGA SEYLER MRN: [id number] Date of Birth: Dec 30, 1970  Transition of Care [facility]) CM/SW [place], RN Phone Number: 12/02/2019, 9:26 AM  Clinical Narrative:     Reviewed case with [facility] Acute Rehab Admissions Coordinator. They have reported with increase in WBC and patient's refusal of care/lab draws.  They want to re-evaluate patient on Monday for medical stability and admission to their facility.  Expected Discharge Plan: [place] Barriers to Discharge: Continued Medical Work up  Expected Discharge Plan and Services Expected Discharge Plan: [place]   Discharge Planning Services: CM Consult Post Acute Care Choice: [place], [place] Living arrangements for the past 2 months: Single Family Home Expected Discharge Date: 12/01/19                                     Social Determinants of Health (SDOH) Interventions    Readmission Risk Interventions Readmission Risk Prevention Plan 11/24/2019  Transportation Screening Complete  PCP or Specialist Appt within 3-5 Days Complete  HRI or [place] Complete  Social Work Consult for [place] Planning/Counseling Complete  Palliative Care Screening Not Applicable  Medication Review Press photographer) Referral to Pharmacy  Some recent data might be hidden

## 2019-12-02 NOTE — Progress Notes (Signed)
Physical Therapy Treatment Patient Details Name: [name] MRN: [id number] DOB: 1970/12/12 Today's Date: 12/02/2019    History of Present Illness presented to ER secondary to R LE swelling, pain; admitted for management of severe cellulitis of R LE.  Hospital course additionally significant for hypotension, requiring transfer to CCU and pressor support; episode of vtach with synchronized cardioversion (10/30), CRRT (via temp dialysis cath, left) 10/30-11/2.  L temp fem cath discontinued and patient cleared for participation with session 11/24/19.    PT Comments    Pt was supine in bed with HOB elevated ~ 20 degrees. He agrees to PT session and was cooperative throughout. He likes to dictate session. Was able to exit L side of bed with Min assist however required mod assist to return to supine after OOB activity. He stood from extremely elevated bed height several times throughout session with Mod assist of one however +2 assist for safety 2/2 to pt's LE weakness and need for hyperextension of knees to maintain  standing. Pt present with severe fatigue and requested to be on 4 L during ambulation. Did ambulate a total of 200 ft however required chair follow and several standing rest breaks. Pt continues to be at high fall risk. Will benefit from continues skilled PT to address strength, endurance, and safety deficits. Recommend rehab at [facility] to address these deficits while assisting pt back to PLOF. He was in bed at conclusion of session with RN/RN tech aware of pt's abilities. Acute PT will continue to follow per POC.      Follow Up Recommendations  CIR;Other (comment) (Cooperative and motivated this session. Eager for rehab )     Clinical biochemist with 5" wheels;3in1 (PT)    Recommendations for Other Services       Precautions / Restrictions Precautions Precautions: Fall Precaution Comments: L AVF, R LE wound (ace bandaged) Restrictions Weight Bearing  Restrictions: No    Mobility  Bed Mobility Overal bed mobility: Needs Assistance Bed Mobility: Supine to Sit;Sit to Supine     Supine to sit: Min assist;HOB elevated Sit to supine: Mod assist   General bed mobility comments: Pt continues to require assistance to exit and re-enter bed after OOB activity. Vcs throughout for improved technique and sequencing.  Transfers Overall transfer level: Needs assistance Equipment used: Rolling walker (2 wheeled) Transfers: Sit to/from Stand Sit to Stand: From elevated surface;Mod assist;+2 safety/equipment         General transfer comment: Pt was able to stand from elevated bed height with mod assist of one, with 2nd person close for safety. pt does continue to present with weakness in BLEs in which continues to make him high fall risk  Ambulation/Gait Ambulation/Gait assistance: +2 safety/equipment;Min assist   Assistive device: Rolling walker (2 wheeled) Gait Pattern/deviations: Trunk flexed (hyper-extension on knees in stance phase) Gait velocity: decreased   General Gait Details: Pt was able to ambulate a total of 200 ft however requires ~ 4 standing rest breaks during . Pt requested 4 L o2 during ambulation and was able to maintain sao2> 94%. Discussed techniques to improve gait and he states understanding       Balance Overall balance assessment: Needs assistance Sitting-balance support: No upper extremity supported;Feet supported Sitting balance-Leahy Scale: Good Sitting balance - Comments: no LOB in sitting   Standing balance support: Bilateral upper extremity supported Standing balance-Leahy Scale: Fair Standing balance comment: reliant on hyperextended knees in standing to maintain standing balance  Cognition Arousal/Alertness: Awake/alert Behavior During Therapy: WFL for tasks assessed/performed (likes to dictate his care) Overall Cognitive Status: Within Functional Limits for tasks assessed        General  Comments: Pt was agreeable to PT session and was cooperative throughout however likes to perform task in a his desired order         General Comments General comments (skin integrity, edema, etc.): Discussed need for increased activity even in bed throughout the day. " I'll try."      Pertinent Vitals/Pain Pain Assessment: 0-10 Pain Score: 4  Pain Location: Bilateral feet pain Pain Descriptors / Indicators: Aching Pain Intervention(s): Limited activity within patient's tolerance;Monitored during session;Premedicated before session           PT Goals (current goals can now be found in the care plan section) Acute Rehab PT Goals Patient Stated Goal: to go to rehab Progress towards PT goals: Progressing toward goals    Frequency    Min 2X/week      PT Plan Current plan remains appropriate    Co-evaluation     PT goals addressed during session: Mobility/safety with mobility;Balance;Proper use of DME;Strengthening/ROM        AM-PAC PT "6 Clicks" Mobility   Outcome Measure  Help needed turning from your back to your side while in a flat bed without using bedrails?: A Little Help needed moving from lying on your back to sitting on the side of a flat bed without using bedrails?: A Lot Help needed moving to and from a bed to a chair (including a wheelchair)?: A Lot Help needed standing up from a chair using your arms (e.g., wheelchair or bedside chair)?: A Lot Help needed to walk in hospital room?: A Lot Help needed climbing 3-5 steps with a railing? : A Lot 6 Click Score: 13    End of Session Equipment Utilized During Treatment: Gait belt Activity Tolerance: Patient tolerated treatment well Patient left: in bed;with bed alarm set;with call bell/phone within reach Nurse Communication: Mobility status;Other (comment) (Discussed with CM/ RN tech/ RN pt's progress) PT Visit Diagnosis: Muscle weakness (generalized) (M62.81);Difficulty in walking, not elsewhere classified  (R26.2);Pain Pain - Right/Left: Right Pain - part of body: Leg     Time: 0920-1000 PT Time Calculation (min) (ACUTE ONLY): 40 min  Charges:  $Gait Training: 23-37 mins $Therapeutic Activity: 8-22 mins                     Julaine Fusi PTA 12/02/19, 11:32 AM

## 2019-12-02 NOTE — Progress Notes (Addendum)
After starting PIV, patient stated to leave bed up in the air and he won't fall out. Patient educated on having the bed down for safety reasons, but still wanted it up in the air some. Ariel, RN made aware that bed is not in the lowest position.

## 2019-12-02 NOTE — Progress Notes (Signed)
Patient educated on fall risk r/t bed not being in lowest position x2 during shift. Bed was up in air. Patient stated that he wasn't lowering his bed. Nurse manager and charge nurse aware. Will continue to monitor.

## 2019-12-02 NOTE — Progress Notes (Signed)
Pt refusing hep subq injection even with education. Will continue to monitor.

## 2019-12-02 NOTE — Consult Note (Signed)
Pharmacy Antibiotic Note  [name] is a 49 y.o. male admitted on 11/17/2019 with cellulitis.  Pharmacy has been consulted for meropenem dosing for pseudomonas in leg wound.  Patient with ciprofloxacin allergy - skin on hands peeled off "in sheets."  Patient on HD TTS  Plan:  Meropenem 500mg  IV q24h based on culture and susceptibility results   Height: 6\' 2"  (188 cm) Weight: 93.6 kg (206 lb 6.4 oz) IBW/kg (Calculated) : 82.2  Temp (24hrs), Avg:98.6 F (37 C), Min:98 F (36.7 C), Max:99.5 F (37.5 C)  Recent Labs  Lab 11/26/19 0544 11/26/19 0544 11/27/19 0632 11/28/19 1037 11/29/19 1646 12/01/19 1100 12/02/19 0727  WBC 11.7*  --  9.5 18.5*  --  21.8* 15.7*  CREATININE 4.66*   < > 4.43* 5.51* 3.38* 5.06* 4.69*   < > = values in this interval not displayed.    Estimated Creatinine Clearance: 21.9 mL/min (A) (by C-G formula based on SCr of 4.69 mg/dL (H)).    Allergies  Allergen Reactions  . Ciprofloxacin Rash  . Sulfa Antibiotics Rash    Antimicrobials this admission: Ongoing 11/12 meropenem 500mg  IV q24h Completed 11/ 11-11 ceftazadime 1g TTS   Microbiology results: 11/9 Superficial wound culture: Susceptibility  Pseudomonas aeruginosa (ZZ00)  Antibiotic Interpretation Microscan Method Status  CEFTAZIDIME Resistant >=64 RESISTANT MIC Final  CIPROFLOXACIN Sensitive <=0.25 SENSITIVE MIC Final  GENTAMICIN Sensitive <=1 SENSITIVE MIC Final  IMIPENEM Sensitive 2 SENSITIVE MIC Final  CEFEPIME Resistant 16 RESISTANT MIC Final  Susceptibility Comments  RARE PSEUDOMONAS AERUGINOSA     Thank you for allowing pharmacy to be a part of this patient's care.  Doreene Eland, PharmD, BCPS.   Work Cell: [phone] 12/02/2019 2:51 PM

## 2019-12-02 NOTE — Progress Notes (Addendum)
PROGRESS NOTE  [name] XBM:[id number] DOB: [birth year]   PCP: Pcp, No  Patient is from: Home.  DOA: 11/17/2019 LOS: 15  Chief complaints: Right lower extremity pain and swelling with purulent discharge  Brief Narrative / Interim history: 49 year old male with history of ESRD on HD TTS, previous kidney transplant on prednisone chronically, anemia of renal disease, diverticulosis and GERD mated with right lower extremity cellulitis after he presented with right lower extremity pain, swelling with purulent discharge and associated fever and rigors.  While waiting on.  In ED, became hypotensive requiring pressors and ICU level of care for septic shock secondary to right lower extremity cellulitis.  Eventually stabilized and transferred to [facility] service on 11/23/2019.  He received different IV antibiotics from 10/28-11/7 which includes IV cefepime from 10/28-11/2.  Patient had leukocytosis after initial improvement.  Infectious disease reconsulted.  Superficial wound culture grew Pseudomonas.  Started on ceftazidime on 12/01/2019.  However, Pseudomonas resistant to ceftazidime.  Started on meropenem on 12/02/2019.   Subjective: Seen and examined earlier this morning.  No major events overnight of this morning.  Still with some pain in his right leg.  Frustrated about delay with CIR transfer.  Objective: Vitals:   12/01/19 1928 12/02/19 0500 12/02/19 0749 12/02/19 1120  BP: (!) 134/55 111/68 (!) 94/59 128/69  Pulse: 79 80 75 77  Resp: 17 18 16 17   Temp: 99.5 F (37.5 C) 98.8 F (37.1 C) 98.4 F (36.9 C) 98 F (36.7 C)  TempSrc: Oral Oral Oral Oral  SpO2: 100% 100% 100% 100%  Weight:  93.6 kg    Height:        Intake/Output Summary (Last 24 hours) at 12/02/2019 1237 Last data filed at 12/02/2019 0950 Gross per 24 hour  Intake 1797 ml  Output 3000 ml  Net -1203 ml   Filed Weights   11/28/19 0500 11/29/19 0408 12/02/19 0500  Weight: 97.9 kg 97.9 kg 93.6 kg     Examination:  GENERAL: No apparent distress.  Nontoxic. HEENT: MMM.  Vision and hearing grossly intact.  NECK: Supple.  No apparent JVD.  RESP: On RA.  No IWOB.  Fair aeration bilaterally. CVS:  RRR.  2/6 SEM over RUSB. ABD/GI/GU: BS+. Abd soft, NTND.  MSK/EXT:  Moves extremities.  1+ pitting edema bilaterally. SKIN: Clear looking RLE wound as below. NEURO: Awake, alert and oriented appropriately.  No apparent focal neuro deficit. PSYCH: Somewhat frustrated     Procedures:  None  Microbiology summarized: 10/28-Limited RVP nonreactive. 10/29-MRSA PCR positive. 10/28-superficial wound culture with multiple species 10/28-blood cultures NGTD. 11/9-superficial wound culture with Pseudomonas resistant to ceftazidime and cefepime  Assessment & Plan: Septic shock secondary to severe right lower extremity cellulitis-CT right leg on 10/28 revealed diffuse and marked subcutaneous soft tissue swelling/edema/fluid suggesting severe cellulitis but no discrete drainable abscess.  Clinically improved.  Has been afebrile.  Hemodynamically stable.  However, patient has rising leukocytosis since 11/9.  Repeat superficial wound culture positive for Pseudomonas resistant to ceftazidime and cefepime.  Wound appears clean without signs of abscess or drainable fluid on exam. -ID recommended stopping ceftazidime and starting meropenem.  Allergic to Cipro. -ID following.  Leukocytosis:  WBC 9.5>>18.5>>22>15.7.  Likely due to the above.  Improving. -Biotics as above.  Wide-complex tachycardia/sinus node dysfunction/paroxysmal A. Fib: CHA2DS2-VASc score 0.  Evaluated by cardiology neurology during this hospitalization.  He has intermittent sinus pauses as long as 6 secs but not symptomatic.  Hemodynamically stable.  He is not on nodal blocking agents. -  Discussed with cardiology, Dr. Garen Lah on 11/10-no further intervention.  No indication for pacemaker. -Optimize electrolytes  Acute metabolic  encephalopathy: Likely due to sepsis and renal failure.  Resolved. -Reorientation and delirium precautions  Hypotension: Resolved. -Continue midodrine  ESRD on HD MWF -Per nephrology.  Received HD today.  S/p renal transplant: On chronic prednisone. -Continue home prednisone after taper  Anemia of renal disease: Stable. -Per nephrology  Chronic pain syndrome -Continue current regimen.  GERD: -Continue PPI  Noncompliance: -Encouraged and counseled.  Debility/physical deconditioning: -PT/OT recommended CIR.  Body mass index is 26.5 kg/m.        Pressure skin injury: Stage I. Pressure Injury 11/29/19 Heel Right Deep Tissue Pressure Injury - Purple or maroon localized area of discolored intact skin or blood-filled blister due to damage of underlying soft tissue from pressure and/or shear. (Active)  11/29/19 0945  Location: Heel  Location Orientation: Right  Staging: Deep Tissue Pressure Injury - Purple or maroon localized area of discolored intact skin or blood-filled blister due to damage of underlying soft tissue from pressure and/or shear.  Wound Description (Comments):   Present on Admission: No   DVT prophylaxis:  heparin injection 5,000 Units Start: 11/17/19 2200  Code Status: Full code. Family Communication: Patient and/or RN.  Attempted to call patient's daughter but no answer. Status is: Inpatient  Remains inpatient appropriate because:Unsafe d/c plan, IV treatments appropriate due to intensity of illness or inability to take PO and Inpatient level of care appropriate due to severity of illness   Dispo: The patient is from: Home              Anticipated d/c is to: CIR              Anticipated d/c date is: > 3 days              Patient currently is not medically stable to d/c.       Consultants:  Nephrology PCCM-signed off Infectious disease   Sch Meds:  Scheduled Meds: . aspirin EC  81 mg Oral Daily  . Chlorhexidine Gluconate Cloth  6 each  Topical Daily  . epoetin (EPOGEN/PROCRIT) injection  10,000 Units Intravenous Q M,W,F-HD  . feeding supplement (NEPRO CARB STEADY)  237 mL Oral BID BM  . ferrous sulfate  325 mg Oral BID  . heparin  5,000 Units Subcutaneous Q8H  . mouth rinse  15 mL Mouth Rinse BID  . midodrine  5 mg Oral TID  . multivitamin  1 tablet Oral QHS  . pantoprazole  40 mg Oral BID  . potassium chloride  40 mEq Oral Once  . predniSONE  10 mg Oral Q breakfast  . sodium chloride flush  3 mL Intravenous Q12H   Continuous Infusions: . sodium chloride 250 mL (11/27/19 2105)  . sodium chloride Stopped (11/23/19 1105)  . [START ON 12/03/2019] cefTAZidime (FORTAZ)  IV     PRN Meds:.sodium chloride, acetaminophen **OR** acetaminophen, atropine, dextrose, HYDROcodone-acetaminophen, loperamide, melatonin, ondansetron (ZOFRAN) IV, sodium chloride, sodium chloride flush, traZODone  Antimicrobials: Anti-infectives (From admission, onward)   Start     Dose/Rate Route Frequency Ordered Stop   12/03/19 1200  cefTAZidime (FORTAZ) 1 g in sodium chloride 0.9 % 100 mL IVPB        1 g 200 mL/hr over 30 Minutes Intravenous Every T-Th-Sa (Hemodialysis) 12/01/19 1442     12/01/19 1600  cefTAZidime (FORTAZ) 2 g in sodium chloride 0.9 % 100 mL IVPB  2 g 200 mL/hr over 30 Minutes Intravenous  Once 12/01/19 1442 12/01/19 1826   11/25/19 2200  ceFAZolin (ANCEF) IVPB 1 g/50 mL premix        1 g 100 mL/hr over 30 Minutes Intravenous Every 12 hours 11/25/19 1426 11/27/19 2137   11/23/19 1800  ceFAZolin (ANCEF) IVPB 1 g/50 mL premix  Status:  Discontinued        1 g 100 mL/hr over 30 Minutes Intravenous Every 24 hours 11/23/19 1222 11/25/19 1426   11/22/19 2200  ceFAZolin (ANCEF) IVPB 2g/100 mL premix  Status:  Discontinued        2 g 200 mL/hr over 30 Minutes Intravenous Every 12 hours 11/22/19 1136 11/23/19 1222   11/19/19 2000  vancomycin (VANCOCIN) IVPB 1000 mg/200 mL premix  Status:  Discontinued        1,000 mg 200 mL/hr  over 60 Minutes Intravenous Every 24 hours 11/19/19 1422 11/21/19 1107   11/19/19 1800  ceFEPIme (MAXIPIME) 2 g in sodium chloride 0.9 % 100 mL IVPB  Status:  Discontinued        2 g 200 mL/hr over 30 Minutes Intravenous Every 12 hours 11/19/19 1422 11/22/19 1135   11/19/19 1200  vancomycin (VANCOCIN) IVPB 1000 mg/200 mL premix  Status:  Discontinued        1,000 mg 200 mL/hr over 60 Minutes Intravenous Every T-Th-Sa (Hemodialysis) 11/17/19 1836 11/18/19 1444   11/18/19 2000  ceFEPIme (MAXIPIME) 2 g in sodium chloride 0.9 % 100 mL IVPB  Status:  Discontinued        2 g 200 mL/hr over 30 Minutes Intravenous Every 24 hours 11/18/19 0154 11/18/19 0858   11/18/19 2000  ceFEPIme (MAXIPIME) 1 g in sodium chloride 0.9 % 100 mL IVPB  Status:  Discontinued        1 g 200 mL/hr over 30 Minutes Intravenous Every 24 hours 11/18/19 0858 11/19/19 1422   11/18/19 1500  cefTRIAXone (ROCEPHIN) 1 g in sodium chloride 0.9 % 100 mL IVPB  Status:  Discontinued        1 g 200 mL/hr over 30 Minutes Intravenous Every 24 hours 11/17/19 1800 11/17/19 2207   11/18/19 1442  vancomycin variable dose per unstable renal function (pharmacist dosing)  Status:  Discontinued         Does not apply See admin instructions 11/18/19 1444 11/19/19 1422   11/17/19 2245  ceFEPIme (MAXIPIME) 2 g in sodium chloride 0.9 % 100 mL IVPB        2 g 200 mL/hr over 30 Minutes Intravenous  Once 11/17/19 2214 11/18/19 0156   11/17/19 2200  clindamycin (CLEOCIN) IVPB 600 mg  Status:  Discontinued        600 mg 100 mL/hr over 30 Minutes Intravenous Every 8 hours 11/17/19 2011 11/18/19 2013   11/17/19 2000  vancomycin (VANCOREADY) IVPB 750 mg/150 mL  Status:  Discontinued        750 mg 150 mL/hr over 60 Minutes Intravenous Every 24 hours 11/17/19 1824 11/17/19 1832   11/17/19 1545  piperacillin-tazobactam (ZOSYN) IVPB 3.375 g  Status:  Discontinued        3.375 g 100 mL/hr over 30 Minutes Intravenous  Once 11/17/19 1535 11/17/19 1544    11/17/19 1545  clindamycin (CLEOCIN) IVPB 900 mg  Status:  Discontinued        900 mg 100 mL/hr over 30 Minutes Intravenous  Once 11/17/19 1535 11/17/19 1544   11/17/19 1500  vancomycin (VANCOREADY) IVPB 1250 mg/250  mL  Status:  Discontinued        1,250 mg 166.7 mL/hr over 90 Minutes Intravenous  Once 11/17/19 1410 11/17/19 1411   11/17/19 1415  vancomycin (VANCOCIN) IVPB 1000 mg/200 mL premix        1,000 mg 200 mL/hr over 60 Minutes Intravenous  Once 11/17/19 1401 11/17/19 1825   11/17/19 1415  cefTRIAXone (ROCEPHIN) 2 g in sodium chloride 0.9 % 100 mL IVPB        2 g 200 mL/hr over 30 Minutes Intravenous  Once 11/17/19 1401 11/17/19 1558       I have personally reviewed the following labs and images: CBC: Recent Labs  Lab 11/26/19 0544 11/27/19 0632 11/28/19 1037 12/01/19 1100 12/02/19 0727  WBC 11.7* 9.5 18.5* 21.8* 15.7*  NEUTROABS 9.2*  --   --   --   --   HGB 9.5* 9.7* 10.1* 9.7* 9.0*  HCT 29.8* 31.8* 31.3* 31.3* 29.4*  MCV 91.7 95.5 92.1 96.0 98.3  PLT 158 148* 196 133* 108*   BMP &GFR Recent Labs  Lab 11/27/19 0632 11/28/19 1037 11/29/19 1646 12/01/19 1100 12/02/19 0727  NA 137 136 138 139 137  K 5.2* 5.0 3.5 4.4 4.6  CL 101 98 99 99 97*  CO2 22 24 27 25 29   GLUCOSE 94 99 99 75 80  BUN 41* 55* 26* 42* 33*  CREATININE 4.43* 5.51* 3.38* 5.06* 4.69*  CALCIUM 9.4 9.6 8.9 9.1 9.1  MG 2.2 2.3 1.8 1.8 1.9  PHOS 5.6* 6.2* 2.8 4.4 4.3   Estimated Creatinine Clearance: 21.9 mL/min (A) (by C-G formula based on SCr of 4.69 mg/dL (H)). Liver & Pancreas: Recent Labs  Lab 11/27/19 0632 11/28/19 1037 11/29/19 1646 12/01/19 1100 12/02/19 0727  ALBUMIN 2.8* 2.9* 2.7* 2.7* 2.6*   No results for input(s): LIPASE, AMYLASE in the last 168 hours. No results for input(s): AMMONIA in the last 168 hours. Diabetic: No results for input(s): HGBA1C in the last 72 hours. No results for input(s): GLUCAP in the last 168 hours. Cardiac Enzymes: No results for input(s):  CKTOTAL, CKMB, CKMBINDEX, TROPONINI in the last 168 hours. No results for input(s): PROBNP in the last 8760 hours. Coagulation Profile: No results for input(s): INR, PROTIME in the last 168 hours. Thyroid Function Tests: No results for input(s): TSH, T4TOTAL, FREET4, T3FREE, THYROIDAB in the last 72 hours. Lipid Profile: No results for input(s): CHOL, HDL, LDLCALC, TRIG, CHOLHDL, LDLDIRECT in the last 72 hours. Anemia Panel: No results for input(s): VITAMINB12, FOLATE, FERRITIN, TIBC, IRON, RETICCTPCT in the last 72 hours. Urine analysis: No results found for: COLORURINE, APPEARANCEUR, LABSPEC, PHURINE, GLUCOSEU, HGBUR, BILIRUBINUR, KETONESUR, PROTEINUR, UROBILINOGEN, NITRITE, LEUKOCYTESUR Sepsis Labs: Invalid input(s): PROCALCITONIN, [place]  Microbiology: Recent Results (from the past 240 hour(s))  Resp Panel by RT PCR (RSV, Flu A&B, Covid) - Nasopharyngeal Swab     Status: None   Collection Time: 11/29/19  2:13 PM   Specimen: Nasopharyngeal Swab  Result Value Ref Range Status   SARS Coronavirus 2 by RT PCR NEGATIVE NEGATIVE Final    Comment: (NOTE) SARS-CoV-2 target nucleic acids are NOT DETECTED.  The SARS-CoV-2 RNA is generally detectable in upper respiratoy specimens during the acute phase of infection. The lowest concentration of SARS-CoV-2 viral copies this assay can detect is 131 copies/mL. A negative result does not preclude SARS-Cov-2 infection and should not be used as the sole basis for treatment or other patient management decisions. A negative result may occur with  improper specimen collection/handling,  submission of specimen other than nasopharyngeal swab, presence of viral mutation(s) within the areas targeted by this assay, and inadequate number of viral copies (<131 copies/mL). A negative result must be combined with clinical observations, patient history, and epidemiological information. The expected result is Negative.  Fact Sheet for Patients:   PinkCheek.be  Fact Sheet for Healthcare Providers:  GravelBags.it  This test is no t yet approved or cleared by the [place] FDA and  has been authorized for detection and/or diagnosis of SARS-CoV-2 by FDA under an Emergency Use Authorization (EUA). This EUA will remain  in effect (meaning this test can be used) for the duration of the COVID-19 declaration under Section 564(b)(1) of the Act, 21 U.S.C. section 360bbb-3(b)(1), unless the authorization is terminated or revoked sooner.     Influenza A by PCR NEGATIVE NEGATIVE Final   Influenza B by PCR NEGATIVE NEGATIVE Final    Comment: (NOTE) The Xpert Xpress SARS-CoV-2/FLU/RSV assay is intended as an aid in  the diagnosis of influenza from Nasopharyngeal swab specimens and  should not be used as a sole basis for treatment. Nasal washings and  aspirates are unacceptable for Xpert Xpress SARS-CoV-2/FLU/RSV  testing.  Fact Sheet for Patients: PinkCheek.be  Fact Sheet for Healthcare Providers: GravelBags.it  This test is not yet approved or cleared by the [place] FDA and  has been authorized for detection and/or diagnosis of SARS-CoV-2 by  FDA under an Emergency Use Authorization (EUA). This EUA will remain  in effect (meaning this test can be used) for the duration of the  Covid-19 declaration under Section 564(b)(1) of the Act, 21  U.S.C. section 360bbb-3(b)(1), unless the authorization is  terminated or revoked.    Respiratory Syncytial Virus by PCR NEGATIVE NEGATIVE Final    Comment: (NOTE) Fact Sheet for Patients: PinkCheek.be  Fact Sheet for Healthcare Providers: GravelBags.it  This test is not yet approved or cleared by the [place] FDA and  has been authorized for detection and/or diagnosis of SARS-CoV-2 by  FDA under an Emergency  Use Authorization (EUA). This EUA will remain  in effect (meaning this test can be used) for the duration of the  COVID-19 declaration under Section 564(b)(1) of the Act, 21 U.S.C.  section 360bbb-3(b)(1), unless the authorization is terminated or  revoked. Performed at [facility], [address], [place], [place] [address]   Aerobic Culture (superficial specimen)     Status: None   Collection Time: 11/29/19  5:23 PM   Specimen: Wound  Result Value Ref Range Status   Specimen Description   Final    WOUND Performed at [facility], [address] [place], [place] [address]    Special Requests   Final    RIGHT LEG Performed at [facility], [address] [place], [place] [address]    Gram Stain   Final    FEW WBC PRESENT, PREDOMINANTLY PMN RARE GRAM POSITIVE COCCI Performed at [facility], [place] [address], [place], [place] [address]    Culture RARE PSEUDOMONAS AERUGINOSA  Final   Report Status 12/02/2019 FINAL  Final   Organism ID, Bacteria PSEUDOMONAS AERUGINOSA  Final      Susceptibility   Pseudomonas aeruginosa - MIC*    CEFTAZIDIME >=64 RESISTANT Resistant     CIPROFLOXACIN <=0.25 SENSITIVE Sensitive     GENTAMICIN <=1 SENSITIVE Sensitive     IMIPENEM 2 SENSITIVE Sensitive     CEFEPIME 16 RESISTANT Resistant     * RARE PSEUDOMONAS  AERUGINOSA    Radiology Studies: No results found.   Hallie Ertl T. [place]  If 7PM-7AM, please contact night-coverage [email or web address] 12/02/2019, 12:37 PM

## 2019-12-02 NOTE — Progress Notes (Addendum)
[facility]  ROUNDING NOTE   Subjective:    Patient resting in bed, in no acute distress.He still has c/o pain in his right leg. No other complaints voiced today.   He received dialysis treatment yesterday, tolerated well.     Objective:  Vital signs in last 24 hours:  Temp:  [98 F (36.7 C)-99.5 F (37.5 C)] 98 F (36.7 C) (11/12 1120) Pulse Rate:  [75-81] 77 (11/12 1120) Resp:  [16-19] 17 (11/12 1120) BP: (94-134)/(53-69) 128/69 (11/12 1120) SpO2:  [100 %] 100 % (11/12 1120) Weight:  [93.6 kg] 93.6 kg (11/12 0500)  Weight change:  Filed Weights   11/28/19 0500 11/29/19 0408 12/02/19 0500  Weight: 97.9 kg 97.9 kg 93.6 kg    Intake/Output: I/O last 3 completed shifts: In: 53 [P.O.:480; NG/GT:237] Out: 3000 [Other:3000]   Intake/Output this shift:  Total I/O In: 1080 [P.O.:1080] Out: 0   Physical Exam: General:  In no acute distress,flat affect  Head: Oral mucous membranes moist  Eyes: Anicteric  Lungs:  Lungs clear to auscultation bilaterally, normal effort  Heart: Irregular  Abdomen:  Soft, non tender,non distended  Extremities:  1+ peripheral edema.on left leg,Rt leg wrapped with dressing  Neurologic: Able to answer simple questions appropriately  Skin: Rt leg with dressing clean,dry and intact  Access: Left AVG    Basic Metabolic Panel: Recent Labs  Lab 11/27/19 0632 11/27/19 0632 11/28/19 1037 11/28/19 1037 11/29/19 1646 12/01/19 1100 12/02/19 0727  NA 137  --  136  --  138 139 137  K 5.2*  --  5.0  --  3.5 4.4 4.6  CL 101  --  98  --  99 99 97*  CO2 22  --  24  --  27 25 29   GLUCOSE 94  --  99  --  99 75 80  BUN 41*  --  55*  --  26* 42* 33*  CREATININE 4.43*  --  5.51*  --  3.38* 5.06* 4.69*  CALCIUM 9.4   < > 9.6   < > 8.9 9.1 9.1  MG 2.2  --  2.3  --  1.8 1.8 1.9  PHOS 5.6*  --  6.2*  --  2.8 4.4 4.3   < > = values in this interval not displayed.    Liver Function Tests: Recent Labs  Lab 11/27/19 0632 11/28/19 1037  11/29/19 1646 12/01/19 1100 12/02/19 0727  ALBUMIN 2.8* 2.9* 2.7* 2.7* 2.6*   No results for input(s): LIPASE, AMYLASE in the last 168 hours. No results for input(s): AMMONIA in the last 168 hours.  CBC: Recent Labs  Lab 11/26/19 0544 11/27/19 0632 11/28/19 1037 12/01/19 1100 12/02/19 0727  WBC 11.7* 9.5 18.5* 21.8* 15.7*  NEUTROABS 9.2*  --   --   --   --   HGB 9.5* 9.7* 10.1* 9.7* 9.0*  HCT 29.8* 31.8* 31.3* 31.3* 29.4*  MCV 91.7 95.5 92.1 96.0 98.3  PLT 158 148* 196 133* 108*    Cardiac Enzymes: No results for input(s): CKTOTAL, CKMB, CKMBINDEX, TROPONINI in the last 168 hours.  BNP: Invalid input(s): POCBNP  CBG: No results for input(s): GLUCAP in the last 168 hours.  Microbiology: Results for orders placed or performed during the hospital encounter of 11/17/19  Wound or Superficial Culture     Status: Abnormal   Collection Time: 11/17/19  2:03 PM   Specimen: Wound  Result Value Ref Range Status   Specimen Description   Final  WOUND Performed at [facility], [address], [place], [place] [address]    Special Requests   Final    RL Performed at [facility], [place], [place] [address]    Gram Stain   Final    NO WBC SEEN ABUNDANT GRAM NEGATIVE RODS FEW GRAM POSITIVE COCCI Performed at [facility], [place] [address] [place], [place] [address]    Culture MULTIPLE ORGANISMS PRESENT, NONE PREDOMINANT (A)  Final   Report Status 11/20/2019 FINAL  Final  Respiratory Panel by RT PCR (Flu A&B, Covid) - Nasopharyngeal Swab     Status: None   Collection Time: 11/17/19  2:05 PM   Specimen: Nasopharyngeal Swab  Result Value Ref Range Status   SARS Coronavirus 2 by RT PCR NEGATIVE NEGATIVE Final    Comment: (NOTE) SARS-CoV-2 target nucleic acids are NOT DETECTED.  The SARS-CoV-2 RNA is generally detectable in upper respiratoy specimens during the acute phase of infection. The lowest concentration of SARS-CoV-2 viral  copies this assay can detect is 131 copies/mL. A negative result does not preclude SARS-Cov-2 infection and should not be used as the sole basis for treatment or other patient management decisions. A negative result may occur with  improper specimen collection/handling, submission of specimen other than nasopharyngeal swab, presence of viral mutation(s) within the areas targeted by this assay, and inadequate number of viral copies (<131 copies/mL). A negative result must be combined with clinical observations, patient history, and epidemiological information. The expected result is Negative.  Fact Sheet for Patients:  PinkCheek.be  Fact Sheet for Healthcare Providers:  GravelBags.it  This test is no t yet approved or cleared by the [place] FDA and  has been authorized for detection and/or diagnosis of SARS-CoV-2 by FDA under an Emergency Use Authorization (EUA). This EUA will remain  in effect (meaning this test can be used) for the duration of the COVID-19 declaration under Section 564(b)(1) of the Act, 21 U.S.C. section 360bbb-3(b)(1), unless the authorization is terminated or revoked sooner.     Influenza A by PCR NEGATIVE NEGATIVE Final   Influenza B by PCR NEGATIVE NEGATIVE Final    Comment: (NOTE) The Xpert Xpress SARS-CoV-2/FLU/RSV assay is intended as an aid in  the diagnosis of influenza from Nasopharyngeal swab specimens and  should not be used as a sole basis for treatment. Nasal washings and  aspirates are unacceptable for Xpert Xpress SARS-CoV-2/FLU/RSV  testing.  Fact Sheet for Patients: PinkCheek.be  Fact Sheet for Healthcare Providers: GravelBags.it  This test is not yet approved or cleared by the [place] FDA and  has been authorized for detection and/or diagnosis of SARS-CoV-2 by  FDA under an Emergency Use Authorization (EUA). This  EUA will remain  in effect (meaning this test can be used) for the duration of the  Covid-19 declaration under Section 564(b)(1) of the Act, 21  U.S.C. section 360bbb-3(b)(1), unless the authorization is  terminated or revoked. Performed at [facility], [address] [address] [place], [place] [address]   Blood Culture (routine x 2)     Status: None   Collection Time: 11/17/19  2:06 PM   Specimen: BLOOD  Result Value Ref Range Status   Specimen Description BLOOD BLOOD RIGHT HAND  Final   Special Requests   Final    BOTTLES DRAWN AEROBIC AND ANAEROBIC Blood Culture results may not be optimal due to an inadequate volume of blood received in culture bottles   Culture   Final  NO GROWTH 5 DAYS Performed at [facility], [address] [place], [place] [address]    Report Status 11/22/2019 FINAL  Final  Blood Culture (routine x 2)     Status: None   Collection Time: 11/17/19  2:06 PM   Specimen: BLOOD  Result Value Ref Range Status   Specimen Description BLOOD BLOOD RIGHT HAND  Final   Special Requests   Final    BOTTLES DRAWN AEROBIC AND ANAEROBIC Blood Culture adequate volume   Culture   Final    NO GROWTH 5 DAYS Performed at [facility], [address] [place], [place] [address]    Report Status 11/22/2019 FINAL  Final  MRSA PCR Screening     Status: Abnormal   Collection Time: 11/18/19  1:03 AM   Specimen: Nasopharyngeal  Result Value Ref Range Status   MRSA by PCR POSITIVE (A) NEGATIVE Corrected    Comment:        The GeneXpert MRSA Assay (FDA approved for NASAL specimens only), is one component of a comprehensive MRSA colonization surveillance program. It is not intended to diagnose MRSA infection nor to guide or monitor treatment for MRSA infections. RESULT CALLED TO, READ BACK BY AND VERIFIED WITH: CYNTHIA VASQUEZ @0245  ON 11/18/19 SKL Performed at [facility], [address] [place], [place] [address] CORRECTED ON 10/29 AT  0249: PREVIOUSLY REPORTED AS POSITIVE        The GeneXpert MRSA Assay (FDA approved for NASAL specimens only), is one component of a comprehensive MRSA colonization surveillance program. It is not intended to diagnose MRSA infection  nor to guide or monitor treatment for MRSA infections. CYNTHIA VASQUEZ @0245  ON 11/18/19 SKL   Resp Panel by RT PCR (RSV, Flu A&B, Covid) - Nasopharyngeal Swab     Status: None   Collection Time: 11/29/19  2:13 PM   Specimen: Nasopharyngeal Swab  Result Value Ref Range Status   SARS Coronavirus 2 by RT PCR NEGATIVE NEGATIVE Final    Comment: (NOTE) SARS-CoV-2 target nucleic acids are NOT DETECTED.  The SARS-CoV-2 RNA is generally detectable in upper respiratoy specimens during the acute phase of infection. The lowest concentration of SARS-CoV-2 viral copies this assay can detect is 131 copies/mL. A negative result does not preclude SARS-Cov-2 infection and should not be used as the sole basis for treatment or other patient management decisions. A negative result may occur with  improper specimen collection/handling, submission of specimen other than nasopharyngeal swab, presence of viral mutation(s) within the areas targeted by this assay, and inadequate number of viral copies (<131 copies/mL). A negative result must be combined with clinical observations, patient history, and epidemiological information. The expected result is Negative.  Fact Sheet for Patients:  PinkCheek.be  Fact Sheet for Healthcare Providers:  GravelBags.it  This test is no t yet approved or cleared by the [place] FDA and  has been authorized for detection and/or diagnosis of SARS-CoV-2 by FDA under an Emergency Use Authorization (EUA). This EUA will remain  in effect (meaning this test can be used) for the duration of the COVID-19 declaration under Section 564(b)(1) of the Act, 21 U.S.C. section 360bbb-3(b)(1), unless  the authorization is terminated or revoked sooner.     Influenza A by PCR NEGATIVE NEGATIVE Final   Influenza B by PCR NEGATIVE NEGATIVE Final    Comment: (NOTE) The Xpert Xpress SARS-CoV-2/FLU/RSV assay is intended as an aid in  the diagnosis of influenza from Nasopharyngeal swab specimens and  should not  be used as a sole basis for treatment. Nasal washings and  aspirates are unacceptable for Xpert Xpress SARS-CoV-2/FLU/RSV  testing.  Fact Sheet for Patients: PinkCheek.be  Fact Sheet for Healthcare Providers: GravelBags.it  This test is not yet approved or cleared by the [place] FDA and  has been authorized for detection and/or diagnosis of SARS-CoV-2 by  FDA under an Emergency Use Authorization (EUA). This EUA will remain  in effect (meaning this test can be used) for the duration of the  Covid-19 declaration under Section 564(b)(1) of the Act, 21  U.S.C. section 360bbb-3(b)(1), unless the authorization is  terminated or revoked.    Respiratory Syncytial Virus by PCR NEGATIVE NEGATIVE Final    Comment: (NOTE) Fact Sheet for Patients: PinkCheek.be  Fact Sheet for Healthcare Providers: GravelBags.it  This test is not yet approved or cleared by the [place] FDA and  has been authorized for detection and/or diagnosis of SARS-CoV-2 by  FDA under an Emergency Use Authorization (EUA). This EUA will remain  in effect (meaning this test can be used) for the duration of the  COVID-19 declaration under Section 564(b)(1) of the Act, 21 U.S.C.  section 360bbb-3(b)(1), unless the authorization is terminated or  revoked. Performed at [facility], [address], [place], [place] [address]   Aerobic Culture (superficial specimen)     Status: None   Collection Time: 11/29/19  5:23 PM   Specimen: Wound  Result Value Ref Range Status   Specimen  Description   Final    WOUND Performed at [facility], [address] [place], [place] [address]    Special Requests   Final    RIGHT LEG Performed at [facility], [address] [place], [place] [address]    Gram Stain   Final    FEW WBC PRESENT, PREDOMINANTLY PMN RARE GRAM POSITIVE COCCI Performed at [facility], [place] [address], [place],  [address]    Culture RARE PSEUDOMONAS AERUGINOSA  Final   Report Status 12/02/2019 FINAL  Final   Organism ID, Bacteria PSEUDOMONAS AERUGINOSA  Final      Susceptibility   Pseudomonas aeruginosa - MIC*    CEFTAZIDIME >=64 RESISTANT Resistant     CIPROFLOXACIN <=0.25 SENSITIVE Sensitive     GENTAMICIN <=1 SENSITIVE Sensitive     IMIPENEM 2 SENSITIVE Sensitive     CEFEPIME 16 RESISTANT Resistant     * RARE PSEUDOMONAS AERUGINOSA    Coagulation Studies: No results for input(s): LABPROT, INR in the last 72 hours.  Urinalysis: No results for input(s): COLORURINE, LABSPEC, PHURINE, GLUCOSEU, HGBUR, BILIRUBINUR, KETONESUR, PROTEINUR, UROBILINOGEN, NITRITE, LEUKOCYTESUR in the last 72 hours.  Invalid input(s): APPERANCEUR    Imaging: No results found.   Medications:   . sodium chloride 250 mL (11/27/19 2105)  . sodium chloride Stopped (11/23/19 1105)  . [START ON 12/03/2019] cefTAZidime (FORTAZ)  IV     . aspirin EC  81 mg Oral Daily  . Chlorhexidine Gluconate Cloth  6 each Topical Daily  . epoetin (EPOGEN/PROCRIT) injection  10,000 Units Intravenous Q M,W,F-HD  . feeding supplement (NEPRO CARB STEADY)  237 mL Oral BID BM  . ferrous sulfate  325 mg Oral BID  . heparin  5,000 Units Subcutaneous Q8H  . mouth rinse  15 mL Mouth Rinse BID  . midodrine  5 mg Oral TID  . multivitamin  1 tablet Oral QHS  . pantoprazole  40 mg Oral BID  . potassium chloride  40 mEq  Oral Once  . predniSONE  10 mg Oral Q breakfast  . sodium chloride flush  3 mL Intravenous Q12H   sodium chloride, acetaminophen **OR**  acetaminophen, atropine, dextrose, HYDROcodone-acetaminophen, loperamide, melatonin, ondansetron (ZOFRAN) IV, sodium chloride, sodium chloride flush, traZODone  Assessment/ Plan:  [name] is a 49 y.o. black male with end stage renal disease on hemodialysis, history of kidney transplant, lupus nephritis, and hypotension who was admitted to [facility] on 11/17/2019 for Cellulitis of right lower extremity [O37.858] ESRD on hemodialysis ([place]) [N18.6, Z99.2] Hypotension [I95.9] Severe sepsis ([place]) [A41.9, R65.20]  [facility]/ [place]/ Left AVG/TTS  # End stage renal disease.  CRRT from 10/30-10/31, Then 11/1-11/2  Patient received dialysis treatment yesterday,ultra filtration of 3L  Patient tolerated treatment well. Volume and electrolyte status acceptable today No need for additional dialysis Will continue TTS schedule Out patient dialysis center arrangement at [place],TTS 5.45am  #Anemia with chronic kidney disease.   Lab Results  Component Value Date   HGB 9.0 (L) 12/02/2019   Will continue Epogen with dialysis   #Secondary Hyperparathyroidism Lab Results  Component Value Date   CALCIUM 9.1 12/02/2019   PHOS 4.3 12/02/2019  No acute indication for Phos binders    LOS: 15 Princy Raju 11/12/20211:08 PM  I saw and evaluated the patient and discussed the care with Crosby Oyster, DNP.  I agree with the findings and plan as documented in the note.    Murlean Iba , MD [facility] 11/12/20214:28 PM

## 2019-12-02 NOTE — Consult Note (Deleted)
Pharmacy Antibiotic Note  [name] is a 49 y.o. male admitted on 11/17/2019 with cellulitis.  Pharmacy has been consulted for ciprofloxacin dosing.  Plan: The dose of ciprofloxacin PO will be adjusted to 250mg  q24H based on renal function.  Height: 6\' 2"  (188 cm) Weight: 93.6 kg (206 lb 6.4 oz) IBW/kg (Calculated) : 82.2  Temp (24hrs), Avg:98.6 F (37 C), Min:98 F (36.7 C), Max:99.5 F (37.5 C)  Recent Labs  Lab 11/26/19 0544 11/26/19 0544 11/27/19 0632 11/28/19 1037 11/29/19 1646 12/01/19 1100 12/02/19 0727  WBC 11.7*  --  9.5 18.5*  --  21.8* 15.7*  CREATININE 4.66*   < > 4.43* 5.51* 3.38* 5.06* 4.69*   < > = values in this interval not displayed.    Estimated Creatinine Clearance: 21.9 mL/min (A) (by C-G formula based on SCr of 4.69 mg/dL (H)).    Allergies  Allergen Reactions  . Ciprofloxacin Rash  . Sulfa Antibiotics Rash    Antimicrobials this admission: Ongoing 11/12 Ciprofloxacin PO 250mg  Q24h Completed 11/ 11-11 ceftazadime 1g TTS   Microbiology results: 11/9 Superficial wound culture: Susceptibility  Pseudomonas aeruginosa (ZZ00)  Antibiotic Interpretation Microscan Method Status  CEFTAZIDIME Resistant >=64 RESISTANT MIC Final  CIPROFLOXACIN Sensitive <=0.25 SENSITIVE MIC Final  GENTAMICIN Sensitive <=1 SENSITIVE MIC Final  IMIPENEM Sensitive 2 SENSITIVE MIC Final  CEFEPIME Resistant 16 RESISTANT MIC Final  Susceptibility Comments  RARE PSEUDOMONAS AERUGINOSA     Thank you for allowing pharmacy to be a part of this patient's care.  Shanon Brow Lysette Lindenbaum 12/02/2019 1:18 PM

## 2019-12-02 NOTE — Plan of Care (Signed)
  Problem: Clinical Measurements: Goal: Ability to maintain clinical measurements within normal limits will improve Outcome: Progressing Goal: Will remain free from infection Outcome: Progressing   Problem: Clinical Measurements: Goal: Signs and symptoms of infection will decrease Outcome: Progressing

## 2019-12-02 NOTE — Progress Notes (Signed)
Pt refused lab draw this am.

## 2019-12-02 NOTE — Progress Notes (Signed)
ID Started on ceftazidime for rt leg ulcer due to pseudomonas and climbing leucocytes.  Wbc down to 15 but the pseudomonas resistant to ceftazidime      Labs  CBC Latest Ref Rng & Units 12/02/2019 12/01/2019 11/28/2019  WBC 4.0 - 10.5 K/uL 15.7(H) 21.8(H) 18.5(H)  Hemoglobin 13.0 - 17.0 g/dL 9.0(L) 9.7(L) 10.1(L)  Hematocrit 39 - 52 % 29.4(L) 31.3(L) 31.3(L)  Platelets 150 - 400 K/uL 108(L) 133(L) 196    CMP Latest Ref Rng & Units 12/02/2019 12/01/2019 11/29/2019  Glucose 70 - 99 mg/dL 80 75 99  BUN 6 - 20 mg/dL 33(H) 42(H) 26(H)  Creatinine 0.61 - 1.24 mg/dL 4.69(H) 5.06(H) 3.38(H)  Sodium 135 - 145 mmol/L 137 139 138  Potassium 3.5 - 5.1 mmol/L 4.6 4.4 3.5  Chloride 98 - 111 mmol/L 97(L) 99 99  CO2 22 - 32 mmol/L 29 25 27   Calcium 8.9 - 10.3 mg/dL 9.1 9.1 8.9  Total Protein 6.5 - 8.1 g/dL - - -  Total Bilirubin 0.3 - 1.2 mg/dL - - -  Alkaline Phos 38 - 126 U/L - - -  AST 15 - 41 U/L - - -  ALT 0 - 44 U/L - - -    Micro 11/17/19 BC-NG 11/17/19- WC- multiple organisms 11/29/19- WC pseudomonas 11/18/19  MRSA nares PCR-+  B/l basilar infiltrate   Impression/Recommendation ? 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 He was in septic shock and was in the ICU. Had CRRT, the wound culture had gram neg rods but was not finalized due to multiple organisms present. He was treated with vanco, ceftriaxone, followed by vanco/ cefepime and clindamycin and then cefazolin for 4 days. The total duration of antibiotic was 10 days. HE also was cardioverted for wide complex Vtac Leucocytosis resolved and then shot up again  The wound did not look overtly infected but because of worsening leucocytosis and pseudomonas in the wound culture  Started ceftazidime on 12/01/19. The sensi is reported today and it is resistant to ceftaz. Eventhough the WBC declined ot 15  will dc ceftaz and change to mero.  If leucocytosis does not resolve will need surgery to evaluate the wound Discussed with hospitalist ID will follow him peripherally this weekend    Discussed the management with the patient and Dr.Gonfa and care team

## 2019-12-02 NOTE — TOC Progression Note (Addendum)
Transition of Care [facility]) - Progression Note    Patient Details  Name: [name] MRN: [id number] Date of Birth: 09-Oct-1970  Transition of Care [facility]) CM/SW [place], RN Phone Number: 12/02/2019, 10:16 AM  Clinical Narrative:      Met with patient this morning, explained concerns to patient about admitting to [facility] acute inpatient rehab today.  They are concerned with labs and positive culture, with new antibiotic treatment.  They also expressed concerns with the behavior of refusing labs and attempt to leave AMA this week.  I explained to [facility], that this is related to his frustration with having to be in the hospital and wanting to get to rehab.  I explained to patient that I expressed this to them.  They would like to see his medical status improve and compliance over the weekend and will evaluate on Monday morning for admission.  He expresses understanding.  He is expressed frustration with multiple lab draws due to his being a "hard stick".  He feels if these could be less frequent or done during dialysis it would be a lot easier on him.    UPdate:  Current Plan is for patient to receive 4 more doses of antibiotics, as of this date.  Expected Discharge Plan: [place] Barriers to Discharge: Continued Medical Work up  Expected Discharge Plan and Services Expected Discharge Plan: [place]   Discharge Planning Services: CM Consult Post Acute Care Choice: [place], [place] Living arrangements for the past 2 months: Single Family Home Expected Discharge Date: 12/01/19                                     Social Determinants of Health (SDOH) Interventions    Readmission Risk Interventions Readmission Risk Prevention Plan 11/24/2019  Transportation Screening Complete  PCP or Specialist Appt within 3-5 Days Complete  HRI or [place] Complete  Social Work Consult for [place]  Planning/Counseling Complete  Palliative Care Screening Not Applicable  Medication Review [employer] photographer) Referral to Pharmacy  Some recent data might be hidden

## 2019-12-03 LAB — CBC WITH DIFFERENTIAL/PLATELET
Abs Immature Granulocytes: 0.12 10*3/uL — ABNORMAL HIGH (ref 0.00–0.07)
Basophils Absolute: 0 10*3/uL (ref 0.0–0.1)
Basophils Relative: 0 %
Eosinophils Absolute: 0.2 10*3/uL (ref 0.0–0.5)
Eosinophils Relative: 1 %
HCT: 28 % — ABNORMAL LOW (ref 39.0–52.0)
Hemoglobin: 8.8 g/dL — ABNORMAL LOW (ref 13.0–17.0)
Immature Granulocytes: 1 %
Lymphocytes Relative: 8 %
Lymphs Abs: 1 10*3/uL (ref 0.7–4.0)
MCH: 30.2 pg (ref 26.0–34.0)
MCHC: 31.4 g/dL (ref 30.0–36.0)
MCV: 96.2 fL (ref 80.0–100.0)
Monocytes Absolute: 1.2 10*3/uL — ABNORMAL HIGH (ref 0.1–1.0)
Monocytes Relative: 10 %
Neutro Abs: 10 10*3/uL — ABNORMAL HIGH (ref 1.7–7.7)
Neutrophils Relative %: 80 %
Platelets: 101 10*3/uL — ABNORMAL LOW (ref 150–400)
RBC: 2.91 MIL/uL — ABNORMAL LOW (ref 4.22–5.81)
RDW: 20.3 % — ABNORMAL HIGH (ref 11.5–15.5)
WBC: 12.5 10*3/uL — ABNORMAL HIGH (ref 4.0–10.5)
nRBC: 0 % (ref 0.0–0.2)

## 2019-12-03 LAB — RENAL FUNCTION PANEL
Albumin: 2.5 g/dL — ABNORMAL LOW (ref 3.5–5.0)
Anion gap: 10 (ref 5–15)
BUN: 44 mg/dL — ABNORMAL HIGH (ref 6–20)
CO2: 29 mmol/L (ref 22–32)
Calcium: 8.9 mg/dL (ref 8.9–10.3)
Chloride: 96 mmol/L — ABNORMAL LOW (ref 98–111)
Creatinine, Ser: 5.55 mg/dL — ABNORMAL HIGH (ref 0.61–1.24)
GFR, Estimated: 12 mL/min — ABNORMAL LOW (ref >60)
Glucose, Bld: 66 mg/dL — ABNORMAL LOW (ref 70–99)
Phosphorus: 5.1 mg/dL — ABNORMAL HIGH (ref 2.5–4.6)
Potassium: 5.1 mmol/L (ref 3.5–5.1)
Sodium: 135 mmol/L (ref 135–145)

## 2019-12-03 LAB — MAGNESIUM: Magnesium: 1.9 mg/dL (ref 1.7–2.4)

## 2019-12-03 NOTE — Progress Notes (Signed)
PT Cancellation Note  Patient Details Name: [name] MRN: [id number] DOB: 08/23/1970   Cancelled Treatment:     PT attempt. Pt off floor for HD. Will return at later time and date and continue to follow per POC.    Willette Pa 12/03/2019, 12:34 PM

## 2019-12-03 NOTE — Plan of Care (Signed)
°  Problem: Clinical Measurements: Goal: Ability to maintain clinical measurements within normal limits will improve Outcome: Progressing Goal: Will remain free from infection Outcome: Progressing   Problem: Safety: Goal: Ability to remain free from injury will improve Outcome: Progressing   Problem: Clinical Measurements: Goal: Signs and symptoms of infection will decrease Outcome: Progressing

## 2019-12-03 NOTE — Progress Notes (Signed)
[facility]  ROUNDING NOTE   Subjective:    Patient seen and evaluated during hemodialysis treatment today. Tolerating well. Noted to be hypotensive. Patient did receive midodrine. Placed in sequential mode at the beginning of dialysis.     Objective:  Vital signs in last 24 hours:  Temp:  [98.3 F (36.8 C)-99 F (37.2 C)] 98.3 F (36.8 C) (11/13 1035) Pulse Rate:  [61-79] 62 (11/13 1300) Resp:  [13-24] 15 (11/13 1300) BP: (95-125)/(45-62) 106/56 (11/13 1300) SpO2:  [91 %-100 %] 100 % (11/13 1300) Weight:  [92.9 kg] 92.9 kg (11/13 0627)  Weight change: -0.722 kg Filed Weights   11/29/19 0408 12/02/19 0500 12/03/19 0627  Weight: 97.9 kg 93.6 kg 92.9 kg    Intake/Output: I/O last 3 completed shifts: In: 2014.2 [P.O.:1680; NG/GT:237; IV Piggyback:97.2] Out: 550 [Urine:200; Stool:350]   Intake/Output this shift:  Total I/O In: 480 [P.O.:480] Out: -   Physical Exam: General: No acute distress,flat affect  Head: Oral mucous membranes moist  Eyes: Anicteric  Lungs:  Lungs clear to auscultation bilaterally, normal effort  Heart: Irregular  Abdomen:  Soft, non tender,non distended  Extremities: 1+ peripheral edema.on left leg,Rt leg wrapped with dressing  Neurologic: Awake, alert, conversant  Skin: Rt leg with dressing clean,dry and intact  Access: Left AVG    Basic Metabolic Panel: Recent Labs  Lab 11/28/19 1037 11/28/19 1037 11/29/19 1646 11/29/19 1646 12/01/19 1100 12/02/19 0727 12/03/19 0723  NA 136  --  138  --  139 137 135  K 5.0  --  3.5  --  4.4 4.6 5.1  CL 98  --  99  --  99 97* 96*  CO2 24  --  27  --  25 29 29   GLUCOSE 99  --  99  --  75 80 66*  BUN 55*  --  26*  --  42* 33* 44*  CREATININE 5.51*  --  3.38*  --  5.06* 4.69* 5.55*  CALCIUM 9.6   < > 8.9   < > 9.1 9.1 8.9  MG 2.3  --  1.8  --  1.8 1.9 1.9  PHOS 6.2*  --  2.8  --  4.4 4.3 5.1*   < > = values in this interval not displayed.    Liver Function Tests: Recent Labs   Lab 11/28/19 1037 11/29/19 1646 12/01/19 1100 12/02/19 0727 12/03/19 0723  ALBUMIN 2.9* 2.7* 2.7* 2.6* 2.5*   No results for input(s): LIPASE, AMYLASE in the last 168 hours. No results for input(s): AMMONIA in the last 168 hours.  CBC: Recent Labs  Lab 11/27/19 0632 11/28/19 1037 12/01/19 1100 12/02/19 0727 12/03/19 0723  WBC 9.5 18.5* 21.8* 15.7* 12.5*  NEUTROABS  --   --   --   --  10.0*  HGB 9.7* 10.1* 9.7* 9.0* 8.8*  HCT 31.8* 31.3* 31.3* 29.4* 28.0*  MCV 95.5 92.1 96.0 98.3 96.2  PLT 148* 196 133* 108* 101*    Cardiac Enzymes: No results for input(s): CKTOTAL, CKMB, CKMBINDEX, TROPONINI in the last 168 hours.  BNP: Invalid input(s): POCBNP  CBG: No results for input(s): GLUCAP in the last 168 hours.  Microbiology: Results for orders placed or performed during the hospital encounter of 11/17/19  Wound or Superficial Culture     Status: Abnormal   Collection Time: 11/17/19  2:03 PM   Specimen: Wound  Result Value Ref Range Status   Specimen Description   Final    WOUND Performed at [facility], [address], [place], [place] [address]    Special Requests   Final    RL Performed at [facility], [place], [place] [address]    Gram Stain   Final    NO WBC SEEN ABUNDANT GRAM NEGATIVE RODS FEW GRAM POSITIVE COCCI Performed at [facility], [place] [address], [place], [place] [address]    Culture MULTIPLE ORGANISMS PRESENT, NONE PREDOMINANT (A)  Final   Report Status 11/20/2019 FINAL  Final  Respiratory Panel by RT PCR (Flu A&B, Covid) - Nasopharyngeal Swab     Status: None   Collection Time: 11/17/19  2:05 PM   Specimen: Nasopharyngeal Swab  Result Value Ref Range Status   SARS Coronavirus 2 by RT PCR NEGATIVE NEGATIVE Final    Comment: (NOTE) SARS-CoV-2 target nucleic acids are NOT DETECTED.  The SARS-CoV-2 RNA is generally detectable in upper respiratoy specimens during the acute phase of infection. The  lowest concentration of SARS-CoV-2 viral copies this assay can detect is 131 copies/mL. A negative result does not preclude SARS-Cov-2 infection and should not be used as the sole basis for treatment or other patient management decisions. A negative result may occur with  improper specimen collection/handling, submission of specimen other than nasopharyngeal swab, presence of viral mutation(s) within the areas targeted by this assay, and inadequate number of viral copies (<131 copies/mL). A negative result must be combined with clinical observations, patient history, and epidemiological information. The expected result is Negative.  Fact Sheet for Patients:  PinkCheek.be  Fact Sheet for Healthcare Providers:  GravelBags.it  This test is no t yet approved or cleared by the [place] FDA and  has been authorized for detection and/or diagnosis of SARS-CoV-2 by FDA under an Emergency Use Authorization (EUA). This EUA will remain  in effect (meaning this test can be used) for the duration of the COVID-19 declaration under Section 564(b)(1) of the Act, 21 U.S.C. section 360bbb-3(b)(1), unless the authorization is terminated or revoked sooner.     Influenza A by PCR NEGATIVE NEGATIVE Final   Influenza B by PCR NEGATIVE NEGATIVE Final    Comment: (NOTE) The Xpert Xpress SARS-CoV-2/FLU/RSV assay is intended as an aid in  the diagnosis of influenza from Nasopharyngeal swab specimens and  should not be used as a sole basis for treatment. Nasal washings and  aspirates are unacceptable for Xpert Xpress SARS-CoV-2/FLU/RSV  testing.  Fact Sheet for Patients: PinkCheek.be  Fact Sheet for Healthcare Providers: GravelBags.it  This test is not yet approved or cleared by the [place] FDA and  has been authorized for detection and/or diagnosis of SARS-CoV-2 by  FDA under  an Emergency Use Authorization (EUA). This EUA will remain  in effect (meaning this test can be used) for the duration of the  Covid-19 declaration under Section 564(b)(1) of the Act, 21  U.S.C. section 360bbb-3(b)(1), unless the authorization is  terminated or revoked. Performed at [facility], [address] [place], [place] [address]   Blood Culture (routine x 2)     Status: None   Collection Time: 11/17/19  2:06 PM   Specimen: BLOOD  Result Value Ref Range Status   Specimen Description BLOOD BLOOD RIGHT HAND  Final   Special Requests   Final    BOTTLES DRAWN AEROBIC AND ANAEROBIC Blood Culture results may not be optimal due to an inadequate volume of blood received in culture bottles   Culture   Final    NO  GROWTH 5 DAYS Performed at [facility], [address] [place], [place] [address]    Report Status 11/22/2019 FINAL  Final  Blood Culture (routine x 2)     Status: None   Collection Time: 11/17/19  2:06 PM   Specimen: BLOOD  Result Value Ref Range Status   Specimen Description BLOOD BLOOD RIGHT HAND  Final   Special Requests   Final    BOTTLES DRAWN AEROBIC AND ANAEROBIC Blood Culture adequate volume   Culture   Final    NO GROWTH 5 DAYS Performed at [facility], [address] [place], [place] [address]    Report Status 11/22/2019 FINAL  Final  MRSA PCR Screening     Status: Abnormal   Collection Time: 11/18/19  1:03 AM   Specimen: Nasopharyngeal  Result Value Ref Range Status   MRSA by PCR POSITIVE (A) NEGATIVE Corrected    Comment:        The GeneXpert MRSA Assay (FDA approved for NASAL specimens only), is one component of a comprehensive MRSA colonization surveillance program. It is not intended to diagnose MRSA infection nor to guide or monitor treatment for MRSA infections. RESULT CALLED TO, READ BACK BY AND VERIFIED WITH: CYNTHIA VASQUEZ @0245  ON 11/18/19 SKL Performed at [facility], [address]  [place], [place] [address] CORRECTED ON 10/29 AT 0249: PREVIOUSLY REPORTED AS POSITIVE        The GeneXpert MRSA Assay (FDA approved for NASAL specimens only), is one component of a comprehensive MRSA colonization surveillance program. It is not intended to diagnose MRSA infection  nor to guide or monitor treatment for MRSA infections. CYNTHIA VASQUEZ @0245  ON 11/18/19 SKL   Resp Panel by RT PCR (RSV, Flu A&B, Covid) - Nasopharyngeal Swab     Status: None   Collection Time: 11/29/19  2:13 PM   Specimen: Nasopharyngeal Swab  Result Value Ref Range Status   SARS Coronavirus 2 by RT PCR NEGATIVE NEGATIVE Final    Comment: (NOTE) SARS-CoV-2 target nucleic acids are NOT DETECTED.  The SARS-CoV-2 RNA is generally detectable in upper respiratoy specimens during the acute phase of infection. The lowest concentration of SARS-CoV-2 viral copies this assay can detect is 131 copies/mL. A negative result does not preclude SARS-Cov-2 infection and should not be used as the sole basis for treatment or other patient management decisions. A negative result may occur with  improper specimen collection/handling, submission of specimen other than nasopharyngeal swab, presence of viral mutation(s) within the areas targeted by this assay, and inadequate number of viral copies (<131 copies/mL). A negative result must be combined with clinical observations, patient history, and epidemiological information. The expected result is Negative.  Fact Sheet for Patients:  PinkCheek.be  Fact Sheet for Healthcare Providers:  GravelBags.it  This test is no t yet approved or cleared by the [place] FDA and  has been authorized for detection and/or diagnosis of SARS-CoV-2 by FDA under an Emergency Use Authorization (EUA). This EUA will remain  in effect (meaning this test can be used) for the duration of the COVID-19 declaration under Section 564(b)(1) of the  Act, 21 U.S.C. section 360bbb-3(b)(1), unless the authorization is terminated or revoked sooner.     Influenza A by PCR NEGATIVE NEGATIVE Final   Influenza B by PCR NEGATIVE NEGATIVE Final    Comment: (NOTE) The Xpert Xpress SARS-CoV-2/FLU/RSV assay is intended as an aid in  the diagnosis of influenza from Nasopharyngeal swab specimens and  should not be  used as a sole basis for treatment. Nasal washings and  aspirates are unacceptable for Xpert Xpress SARS-CoV-2/FLU/RSV  testing.  Fact Sheet for Patients: PinkCheek.be  Fact Sheet for Healthcare Providers: GravelBags.it  This test is not yet approved or cleared by the [place] FDA and  has been authorized for detection and/or diagnosis of SARS-CoV-2 by  FDA under an Emergency Use Authorization (EUA). This EUA will remain  in effect (meaning this test can be used) for the duration of the  Covid-19 declaration under Section 564(b)(1) of the Act, 21  U.S.C. section 360bbb-3(b)(1), unless the authorization is  terminated or revoked.    Respiratory Syncytial Virus by PCR NEGATIVE NEGATIVE Final    Comment: (NOTE) Fact Sheet for Patients: PinkCheek.be  Fact Sheet for Healthcare Providers: GravelBags.it  This test is not yet approved or cleared by the [place] FDA and  has been authorized for detection and/or diagnosis of SARS-CoV-2 by  FDA under an Emergency Use Authorization (EUA). This EUA will remain  in effect (meaning this test can be used) for the duration of the  COVID-19 declaration under Section 564(b)(1) of the Act, 21 U.S.C.  section 360bbb-3(b)(1), unless the authorization is terminated or  revoked. Performed at [facility], [address] [place], [place] [address]   Aerobic Culture (superficial specimen)     Status: None   Collection Time: 11/29/19  5:23 PM   Specimen: Wound   Result Value Ref Range Status   Specimen Description   Final    WOUND Performed at [facility], [address], [place], [place] [address]    Special Requests   Final    RIGHT LEG Performed at [facility], [address] [place], [place] [address]    Gram Stain   Final    FEW WBC PRESENT, PREDOMINANTLY PMN RARE GRAM POSITIVE COCCI Performed at [facility], [place] [address] [place], [place] [address]    Culture RARE PSEUDOMONAS AERUGINOSA  Final   Report Status 12/02/2019 FINAL  Final   Organism ID, Bacteria PSEUDOMONAS AERUGINOSA  Final      Susceptibility   Pseudomonas aeruginosa - MIC*    CEFTAZIDIME >=64 RESISTANT Resistant     CIPROFLOXACIN <=0.25 SENSITIVE Sensitive     GENTAMICIN <=1 SENSITIVE Sensitive     IMIPENEM 2 SENSITIVE Sensitive     CEFEPIME 16 RESISTANT Resistant     * RARE PSEUDOMONAS AERUGINOSA    Coagulation Studies: No results for input(s): LABPROT, INR in the last 72 hours.  Urinalysis: No results for input(s): COLORURINE, LABSPEC, PHURINE, GLUCOSEU, HGBUR, BILIRUBINUR, KETONESUR, PROTEINUR, UROBILINOGEN, NITRITE, LEUKOCYTESUR in the last 72 hours.  Invalid input(s): APPERANCEUR    Imaging: No results found.   Medications:   . sodium chloride 250 mL (12/02/19 1653)  . sodium chloride Stopped (11/23/19 1105)  . meropenem (MERREM) IV Stopped (12/02/19 1725)   . aspirin EC  81 mg Oral Daily  . Chlorhexidine Gluconate Cloth  6 each Topical Daily  . epoetin (EPOGEN/PROCRIT) injection  10,000 Units Intravenous Q M,W,F-HD  . feeding supplement (NEPRO CARB STEADY)  237 mL Oral BID BM  . ferrous sulfate  325 mg Oral BID  . heparin  5,000 Units Subcutaneous Q8H  . mouth rinse  15 mL Mouth Rinse BID  . midodrine  5 mg Oral TID  . multivitamin  1 tablet Oral QHS  . pantoprazole  40 mg Oral BID  . potassium chloride  40 mEq Oral Once  .  predniSONE  10 mg Oral Q breakfast  . sodium chloride flush  3 mL Intravenous Q12H    sodium chloride, acetaminophen **OR** acetaminophen, atropine, dextrose, HYDROcodone-acetaminophen, loperamide, melatonin, ondansetron (ZOFRAN) IV, sodium chloride, sodium chloride flush, traZODone  Assessment/ Plan:  [name] is a 49 y.o. black male with end stage renal disease on hemodialysis, history of kidney transplant, lupus nephritis, and hypotension who was admitted to [facility] on 11/17/2019 for Cellulitis of right lower extremity [B86.754] ESRD on hemodialysis ([place]) [N18.6, Z99.2] Hypotension [I95.9] Severe sepsis ([place]) [A41.9, R65.20]  [facility]/ [place]/ Left AVG/TTS  # End stage renal disease.  CRRT from 10/30-10/31, Then 11/1-11/2  -Patient seen and evaluated during hemodialysis treatment today.  Tolerating well.  Noted to be a bit hypotensive.  Received midodrine.  We placed him in sequential with the profile.  #Anemia with chronic kidney disease.   Lab Results  Component Value Date   HGB 8.8 (L) 12/03/2019  Maintain Epogen with dialysis treatments.  #Secondary Hyperparathyroidism Lab Results  Component Value Date   CALCIUM 8.9 12/03/2019   PHOS 5.1 (H) 12/03/2019  Phosphorus acceptable at 5.1.  Continue to periodically monitor.    LOS: 16 [name] 11/13/20211:10 PM  I saw and evaluated the patient and discussed the care with Crosby Oyster, DNP.  I agree with the findings and plan as documented in the note.    [name] , [facility] 11/13/20211:10 PM

## 2019-12-03 NOTE — Progress Notes (Signed)
PROGRESS NOTE  [name] CNO:[id number] DOB: 05-20-1970   PCP: Pcp, No  Patient is from: Home.  DOA: 11/17/2019 LOS: 68  Chief complaints: Right lower extremity pain and swelling with purulent discharge  Brief Narrative / Interim history: 49 year old male with history of ESRD on HD TTS, previous kidney transplant on prednisone chronically, anemia of renal disease, diverticulosis and GERD mated with right lower extremity cellulitis after he presented with right lower extremity pain, swelling with purulent discharge and associated fever and rigors.  While waiting on.  In ED, became hypotensive requiring pressors and ICU level of care for septic shock secondary to right lower extremity cellulitis.  Eventually stabilized and transferred to [facility] service on 11/23/2019.  He received different IV antibiotics from 10/28-11/7 which includes IV cefepime from 10/28-11/2.  Patient had leukocytosis after initial improvement.  Infectious disease reconsulted.  Superficial wound culture grew Pseudomonas.  Started on ceftazidime on 12/01/2019.  However, Pseudomonas resistant to ceftazidime.  Started on meropenem on 12/02/2019.   Subjective: Seen and examined earlier this morning.  No major events overnight of this morning.  No major complaints other than the usual pain in right leg.  Objective: Vitals:   12/03/19 1200 12/03/19 1215 12/03/19 1230 12/03/19 1245  BP: (!) 112/58 (!) 107/53 (!) 109/50 (!) 110/54  Pulse: 65 72 70 61  Resp: 14 (!) 24 16 13   Temp:      TempSrc:      SpO2: 100% 100% 100% 100%  Weight:      Height:        Intake/Output Summary (Last 24 hours) at 12/03/2019 1250 Last data filed at 12/03/2019 1025 Gross per 24 hour  Intake 1177.21 ml  Output 550 ml  Net 627.21 ml   Filed Weights   11/29/19 0408 12/02/19 0500 12/03/19 0627  Weight: 97.9 kg 93.6 kg 92.9 kg    Examination:  GENERAL: No apparent distress.  Nontoxic. HEENT: MMM.  Vision and hearing grossly intact.   NECK: Supple.  No apparent JVD.  RESP:  No IWOB.  Fair aeration bilaterally. CVS:  RRR.  2/6 SEM over RUSB. ABD/GI/GU: BS+. Abd soft, NTND.  MSK/EXT:  Moves extremities but weak.  1+ pitting edema bilaterally. SKIN: no apparent skin lesion or wound NEURO: Awake, alert and oriented appropriately.  BLE weakness PSYCH: Somewhat irritable.     Procedures:  None  Microbiology summarized: 10/28-Limited RVP nonreactive. 10/29-MRSA PCR positive. 10/28-superficial wound culture with multiple species 10/28-blood cultures NGTD. 11/9-superficial wound culture with Pseudomonas resistant to ceftazidime and cefepime  Assessment & Plan: Septic shock secondary to severe right lower extremity cellulitis-CT right leg on 10/28 revealed diffuse and marked subcutaneous soft tissue swelling/edema/fluid suggesting severe cellulitis but no discrete drainable abscess.  Clinically improved.  Has been afebrile.  Hemodynamically stable.  However, patient has rising leukocytosis since 11/9.  Repeat superficial wound culture positive for Pseudomonas resistant to ceftazidime and cefepime.  Wound appears clean without signs of abscess or drainable fluid on exam. -IV meropenem on 11/12>>> per ID.  Leukocytosis:  WBC 9.5>>18.5>>22> 12.5.  Likely due to the above.  Improving. -Antibiotics as above.  Wide-complex tachycardia/sinus node dysfunction/paroxysmal A. Fib: CHA2DS2-VASc score 0.  Evaluated by cardiology neurology during this hospitalization.  He has intermittent sinus pauses as long as 6 secs but not symptomatic.  Hemodynamically stable.  He is not on nodal blocking agents. -Discussed with cardiology, Dr. Garen Lah on 11/10-no further intervention.  No indication for pacemaker. -Optimize electrolytes  Acute metabolic encephalopathy: Likely due to  sepsis and renal failure.  Resolved. -Reorientation and delirium precautions  Hypotension: Resolved. -Continue midodrine  ESRD on HD MWF -Per nephrology.   Received HD today.  S/p renal transplant: On chronic prednisone. -Continue home prednisone after taper  Anemia of renal disease: Stable. -Per nephrology  Chronic pain syndrome -Continue current regimen.  GERD: -Continue PPI  Noncompliance: Improved. -Encouraged and counseled.  Debility/physical deconditioning: -PT/OT recommended CIR.  Body mass index is 26.3 kg/m.        Pressure skin injury: Stage I. Pressure Injury 11/29/19 Heel Right Deep Tissue Pressure Injury - Purple or maroon localized area of discolored intact skin or blood-filled blister due to damage of underlying soft tissue from pressure and/or shear. (Active)  11/29/19 0945  Location: Heel  Location Orientation: Right  Staging: Deep Tissue Pressure Injury - Purple or maroon localized area of discolored intact skin or blood-filled blister due to damage of underlying soft tissue from pressure and/or shear.  Wound Description (Comments):   Present on Admission: No   DVT prophylaxis:  heparin injection 5,000 Units Start: 11/17/19 2200  Code Status: Full code. Family Communication: Patient and/or RN.  Status is: Inpatient  Remains inpatient appropriate because:Unsafe d/c plan, IV treatments appropriate due to intensity of illness or inability to take PO and Inpatient level of care appropriate due to severity of illness   Dispo: The patient is from: Home              Anticipated d/c is to: CIR              Anticipated d/c date is: 2 days              Patient currently is not medically stable to d/c.       Consultants:  Nephrology PCCM-signed off Infectious disease   Sch Meds:  Scheduled Meds:  aspirin EC  81 mg Oral Daily   Chlorhexidine Gluconate Cloth  6 each Topical Daily   epoetin (EPOGEN/PROCRIT) injection  10,000 Units Intravenous Q M,W,F-HD   feeding supplement (NEPRO CARB STEADY)  237 mL Oral BID BM   ferrous sulfate  325 mg Oral BID   heparin  5,000 Units Subcutaneous Q8H    mouth rinse  15 mL Mouth Rinse BID   midodrine  5 mg Oral TID   multivitamin  1 tablet Oral QHS   pantoprazole  40 mg Oral BID   potassium chloride  40 mEq Oral Once   predniSONE  10 mg Oral Q breakfast   sodium chloride flush  3 mL Intravenous Q12H   Continuous Infusions:  sodium chloride 250 mL (12/02/19 1653)   sodium chloride Stopped (11/23/19 1105)   meropenem (MERREM) IV Stopped (12/02/19 1725)   PRN Meds:.sodium chloride, acetaminophen **OR** acetaminophen, atropine, dextrose, HYDROcodone-acetaminophen, loperamide, melatonin, ondansetron (ZOFRAN) IV, sodium chloride, sodium chloride flush, traZODone  Antimicrobials: Anti-infectives (From admission, onward)   Start     Dose/Rate Route Frequency Ordered Stop   12/03/19 1200  cefTAZidime (FORTAZ) 1 g in sodium chloride 0.9 % 100 mL IVPB  Status:  Discontinued        1 g 200 mL/hr over 30 Minutes Intravenous Every T-Th-Sa (Hemodialysis) 12/01/19 1442 12/02/19 1405   12/02/19 1600  meropenem (MERREM) 500 mg in sodium chloride 0.9 % 100 mL IVPB        500 mg 200 mL/hr over 30 Minutes Intravenous Every 24 hours 12/02/19 1448     12/02/19 1430  ciprofloxacin (CIPRO) tablet 250 mg  Status:  Discontinued  250 mg Oral Daily with breakfast 12/02/19 1315 12/02/19 1416   12/01/19 1600  cefTAZidime (FORTAZ) 2 g in sodium chloride 0.9 % 100 mL IVPB        2 g 200 mL/hr over 30 Minutes Intravenous  Once 12/01/19 1442 12/01/19 1826   11/25/19 2200  ceFAZolin (ANCEF) IVPB 1 g/50 mL premix        1 g 100 mL/hr over 30 Minutes Intravenous Every 12 hours 11/25/19 1426 11/27/19 2137   11/23/19 1800  ceFAZolin (ANCEF) IVPB 1 g/50 mL premix  Status:  Discontinued        1 g 100 mL/hr over 30 Minutes Intravenous Every 24 hours 11/23/19 1222 11/25/19 1426   11/22/19 2200  ceFAZolin (ANCEF) IVPB 2g/100 mL premix  Status:  Discontinued        2 g 200 mL/hr over 30 Minutes Intravenous Every 12 hours 11/22/19 1136 11/23/19 1222    11/19/19 2000  vancomycin (VANCOCIN) IVPB 1000 mg/200 mL premix  Status:  Discontinued        1,000 mg 200 mL/hr over 60 Minutes Intravenous Every 24 hours 11/19/19 1422 11/21/19 1107   11/19/19 1800  ceFEPIme (MAXIPIME) 2 g in sodium chloride 0.9 % 100 mL IVPB  Status:  Discontinued        2 g 200 mL/hr over 30 Minutes Intravenous Every 12 hours 11/19/19 1422 11/22/19 1135   11/19/19 1200  vancomycin (VANCOCIN) IVPB 1000 mg/200 mL premix  Status:  Discontinued        1,000 mg 200 mL/hr over 60 Minutes Intravenous Every T-Th-Sa (Hemodialysis) 11/17/19 1836 11/18/19 1444   11/18/19 2000  ceFEPIme (MAXIPIME) 2 g in sodium chloride 0.9 % 100 mL IVPB  Status:  Discontinued        2 g 200 mL/hr over 30 Minutes Intravenous Every 24 hours 11/18/19 0154 11/18/19 0858   11/18/19 2000  ceFEPIme (MAXIPIME) 1 g in sodium chloride 0.9 % 100 mL IVPB  Status:  Discontinued        1 g 200 mL/hr over 30 Minutes Intravenous Every 24 hours 11/18/19 0858 11/19/19 1422   11/18/19 1500  cefTRIAXone (ROCEPHIN) 1 g in sodium chloride 0.9 % 100 mL IVPB  Status:  Discontinued        1 g 200 mL/hr over 30 Minutes Intravenous Every 24 hours 11/17/19 1800 11/17/19 2207   11/18/19 1442  vancomycin variable dose per unstable renal function (pharmacist dosing)  Status:  Discontinued         Does not apply See admin instructions 11/18/19 1444 11/19/19 1422   11/17/19 2245  ceFEPIme (MAXIPIME) 2 g in sodium chloride 0.9 % 100 mL IVPB        2 g 200 mL/hr over 30 Minutes Intravenous  Once 11/17/19 2214 11/18/19 0156   11/17/19 2200  clindamycin (CLEOCIN) IVPB 600 mg  Status:  Discontinued        600 mg 100 mL/hr over 30 Minutes Intravenous Every 8 hours 11/17/19 2011 11/18/19 2013   11/17/19 2000  vancomycin (VANCOREADY) IVPB 750 mg/150 mL  Status:  Discontinued        750 mg 150 mL/hr over 60 Minutes Intravenous Every 24 hours 11/17/19 1824 11/17/19 1832   11/17/19 1545  piperacillin-tazobactam (ZOSYN) IVPB 3.375 g   Status:  Discontinued        3.375 g 100 mL/hr over 30 Minutes Intravenous  Once 11/17/19 1535 11/17/19 1544   11/17/19 1545  clindamycin (CLEOCIN) IVPB 900 mg  Status:  Discontinued        900 mg 100 mL/hr over 30 Minutes Intravenous  Once 11/17/19 1535 11/17/19 1544   11/17/19 1500  vancomycin (VANCOREADY) IVPB 1250 mg/250 mL  Status:  Discontinued        1,250 mg 166.7 mL/hr over 90 Minutes Intravenous  Once 11/17/19 1410 11/17/19 1411   11/17/19 1415  vancomycin (VANCOCIN) IVPB 1000 mg/200 mL premix        1,000 mg 200 mL/hr over 60 Minutes Intravenous  Once 11/17/19 1401 11/17/19 1825   11/17/19 1415  cefTRIAXone (ROCEPHIN) 2 g in sodium chloride 0.9 % 100 mL IVPB        2 g 200 mL/hr over 30 Minutes Intravenous  Once 11/17/19 1401 11/17/19 1558       I have personally reviewed the following labs and images: CBC: Recent Labs  Lab 11/27/19 0632 11/28/19 1037 12/01/19 1100 12/02/19 0727 12/03/19 0723  WBC 9.5 18.5* 21.8* 15.7* 12.5*  NEUTROABS  --   --   --   --  10.0*  HGB 9.7* 10.1* 9.7* 9.0* 8.8*  HCT 31.8* 31.3* 31.3* 29.4* 28.0*  MCV 95.5 92.1 96.0 98.3 96.2  PLT 148* 196 133* 108* 101*   BMP &GFR Recent Labs  Lab 11/28/19 1037 11/29/19 1646 12/01/19 1100 12/02/19 0727 12/03/19 0723  NA 136 138 139 137 135  K 5.0 3.5 4.4 4.6 5.1  CL 98 99 99 97* 96*  CO2 24 27 25 29 29   GLUCOSE 99 99 75 80 66*  BUN 55* 26* 42* 33* 44*  CREATININE 5.51* 3.38* 5.06* 4.69* 5.55*  CALCIUM 9.6 8.9 9.1 9.1 8.9  MG 2.3 1.8 1.8 1.9 1.9  PHOS 6.2* 2.8 4.4 4.3 5.1*   Estimated Creatinine Clearance: 18.5 mL/min (A) (by C-G formula based on SCr of 5.55 mg/dL (H)). Liver & Pancreas: Recent Labs  Lab 11/28/19 1037 11/29/19 1646 12/01/19 1100 12/02/19 0727 12/03/19 0723  ALBUMIN 2.9* 2.7* 2.7* 2.6* 2.5*   No results for input(s): LIPASE, AMYLASE in the last 168 hours. No results for input(s): AMMONIA in the last 168 hours. Diabetic: No results for input(s): HGBA1C in the  last 72 hours. No results for input(s): GLUCAP in the last 168 hours. Cardiac Enzymes: No results for input(s): CKTOTAL, CKMB, CKMBINDEX, TROPONINI in the last 168 hours. No results for input(s): PROBNP in the last 8760 hours. Coagulation Profile: No results for input(s): INR, PROTIME in the last 168 hours. Thyroid Function Tests: No results for input(s): TSH, T4TOTAL, FREET4, T3FREE, THYROIDAB in the last 72 hours. Lipid Profile: No results for input(s): CHOL, HDL, LDLCALC, TRIG, CHOLHDL, LDLDIRECT in the last 72 hours. Anemia Panel: No results for input(s): VITAMINB12, FOLATE, FERRITIN, TIBC, IRON, RETICCTPCT in the last 72 hours. Urine analysis: No results found for: COLORURINE, APPEARANCEUR, LABSPEC, PHURINE, GLUCOSEU, HGBUR, BILIRUBINUR, KETONESUR, PROTEINUR, UROBILINOGEN, NITRITE, LEUKOCYTESUR Sepsis Labs: Invalid input(s): PROCALCITONIN, [place]  Microbiology: Recent Results (from the past 240 hour(s))  Resp Panel by RT PCR (RSV, Flu A&B, Covid) - Nasopharyngeal Swab     Status: None   Collection Time: 11/29/19  2:13 PM   Specimen: Nasopharyngeal Swab  Result Value Ref Range Status   SARS Coronavirus 2 by RT PCR NEGATIVE NEGATIVE Final    Comment: (NOTE) SARS-CoV-2 target nucleic acids are NOT DETECTED.  The SARS-CoV-2 RNA is generally detectable in upper respiratoy specimens during the acute phase of infection. The lowest concentration of SARS-CoV-2 viral copies this assay can detect is 131 copies/mL.  A negative result does not preclude SARS-Cov-2 infection and should not be used as the sole basis for treatment or other patient management decisions. A negative result may occur with  improper specimen collection/handling, submission of specimen other than nasopharyngeal swab, presence of viral mutation(s) within the areas targeted by this assay, and inadequate number of viral copies (<131 copies/mL). A negative result must be combined with clinical observations,  patient history, and epidemiological information. The expected result is Negative.  Fact Sheet for Patients:  PinkCheek.be  Fact Sheet for Healthcare Providers:  GravelBags.it  This test is no t yet approved or cleared by the [place] FDA and  has been authorized for detection and/or diagnosis of SARS-CoV-2 by FDA under an Emergency Use Authorization (EUA). This EUA will remain  in effect (meaning this test can be used) for the duration of the COVID-19 declaration under Section 564(b)(1) of the Act, 21 U.S.C. section 360bbb-3(b)(1), unless the authorization is terminated or revoked sooner.     Influenza A by PCR NEGATIVE NEGATIVE Final   Influenza B by PCR NEGATIVE NEGATIVE Final    Comment: (NOTE) The Xpert Xpress SARS-CoV-2/FLU/RSV assay is intended as an aid in  the diagnosis of influenza from Nasopharyngeal swab specimens and  should not be used as a sole basis for treatment. Nasal washings and  aspirates are unacceptable for Xpert Xpress SARS-CoV-2/FLU/RSV  testing.  Fact Sheet for Patients: PinkCheek.be  Fact Sheet for Healthcare Providers: GravelBags.it  This test is not yet approved or cleared by the [place] FDA and  has been authorized for detection and/or diagnosis of SARS-CoV-2 by  FDA under an Emergency Use Authorization (EUA). This EUA will remain  in effect (meaning this test can be used) for the duration of the  Covid-19 declaration under Section 564(b)(1) of the Act, 21  U.S.C. section 360bbb-3(b)(1), unless the authorization is  terminated or revoked.    Respiratory Syncytial Virus by PCR NEGATIVE NEGATIVE Final    Comment: (NOTE) Fact Sheet for Patients: PinkCheek.be  Fact Sheet for Healthcare Providers: GravelBags.it  This test is not yet approved or cleared by the  [place] FDA and  has been authorized for detection and/or diagnosis of SARS-CoV-2 by  FDA under an Emergency Use Authorization (EUA). This EUA will remain  in effect (meaning this test can be used) for the duration of the  COVID-19 declaration under Section 564(b)(1) of the Act, 21 U.S.C.  section 360bbb-3(b)(1), unless the authorization is terminated or  revoked. Performed at [facility], [address], [place], [place] [address]   Aerobic Culture (superficial specimen)     Status: None   Collection Time: 11/29/19  5:23 PM   Specimen: Wound  Result Value Ref Range Status   Specimen Description   Final    WOUND Performed at [facility], [address], [place], [place] [address]    Special Requests   Final    RIGHT LEG Performed at [facility], [address] [place], [place] [address]    Gram Stain   Final    FEW WBC PRESENT, PREDOMINANTLY PMN RARE GRAM POSITIVE COCCI Performed at [facility], [place] [address] [place], [place] [address]    Culture RARE PSEUDOMONAS AERUGINOSA  Final   Report Status 12/02/2019 FINAL  Final   Organism ID, Bacteria PSEUDOMONAS AERUGINOSA  Final      Susceptibility   Pseudomonas aeruginosa - MIC*    CEFTAZIDIME >=64 RESISTANT Resistant     CIPROFLOXACIN <=  0.25 SENSITIVE Sensitive     GENTAMICIN <=1 SENSITIVE Sensitive     IMIPENEM 2 SENSITIVE Sensitive     CEFEPIME 16 RESISTANT Resistant     * RARE PSEUDOMONAS AERUGINOSA    Radiology Studies: No results found.   [name]  If 7PM-7AM, please contact night-coverage [email or web address] 12/03/2019, 12:50 PM

## 2019-12-04 LAB — RENAL FUNCTION PANEL
Albumin: 2.5 g/dL — ABNORMAL LOW (ref 3.5–5.0)
Anion gap: 10 (ref 5–15)
BUN: 34 mg/dL — ABNORMAL HIGH (ref 6–20)
CO2: 28 mmol/L (ref 22–32)
Calcium: 8.6 mg/dL — ABNORMAL LOW (ref 8.9–10.3)
Chloride: 95 mmol/L — ABNORMAL LOW (ref 98–111)
Creatinine, Ser: 4.37 mg/dL — ABNORMAL HIGH (ref 0.61–1.24)
GFR, Estimated: 16 mL/min — ABNORMAL LOW (ref >60)
Glucose, Bld: 103 mg/dL — ABNORMAL HIGH (ref 70–99)
Phosphorus: 4.7 mg/dL — ABNORMAL HIGH (ref 2.5–4.6)
Potassium: 4.8 mmol/L (ref 3.5–5.1)
Sodium: 133 mmol/L — ABNORMAL LOW (ref 135–145)

## 2019-12-04 LAB — CBC WITH DIFFERENTIAL/PLATELET
Abs Immature Granulocytes: 0.09 10*3/uL — ABNORMAL HIGH (ref 0.00–0.07)
Basophils Absolute: 0 10*3/uL (ref 0.0–0.1)
Basophils Relative: 0 %
Eosinophils Absolute: 0.1 10*3/uL (ref 0.0–0.5)
Eosinophils Relative: 1 %
HCT: 27.8 % — ABNORMAL LOW (ref 39.0–52.0)
Hemoglobin: 8.6 g/dL — ABNORMAL LOW (ref 13.0–17.0)
Immature Granulocytes: 1 %
Lymphocytes Relative: 8 %
Lymphs Abs: 0.8 10*3/uL (ref 0.7–4.0)
MCH: 30.3 pg (ref 26.0–34.0)
MCHC: 30.9 g/dL (ref 30.0–36.0)
MCV: 97.9 fL (ref 80.0–100.0)
Monocytes Absolute: 1 10*3/uL (ref 0.1–1.0)
Monocytes Relative: 9 %
Neutro Abs: 8.8 10*3/uL — ABNORMAL HIGH (ref 1.7–7.7)
Neutrophils Relative %: 81 %
Platelets: 104 10*3/uL — ABNORMAL LOW (ref 150–400)
RBC: 2.84 MIL/uL — ABNORMAL LOW (ref 4.22–5.81)
RDW: 19.5 % — ABNORMAL HIGH (ref 11.5–15.5)
WBC: 10.8 10*3/uL — ABNORMAL HIGH (ref 4.0–10.5)
nRBC: 0 % (ref 0.0–0.2)

## 2019-12-04 LAB — MAGNESIUM: Magnesium: 1.8 mg/dL (ref 1.7–2.4)

## 2019-12-04 MED ORDER — TAMSULOSIN HCL 0.4 MG PO CAPS
0.8000 mg | ORAL_CAPSULE | Freq: Every day | ORAL | Status: DC
  Administered 2019-12-04 – 2019-12-05 (×2): 0.8 mg via ORAL
  Filled 2019-12-04 (×2): qty 2

## 2019-12-04 MED ORDER — EPOETIN ALFA 10000 UNIT/ML IJ SOLN: 10000.0000 [IU] | INTRAMUSCULAR | Status: DC

## 2019-12-04 MED ORDER — MIDODRINE HCL 5 MG PO TABS
10.0000 mg | ORAL_TABLET | Freq: Three times a day (TID) | ORAL | Status: DC
  Administered 2019-12-04 – 2019-12-05 (×5): 10 mg via ORAL
  Filled 2019-12-04 (×5): qty 2

## 2019-12-04 MED ORDER — LOPERAMIDE HCL 2 MG PO CAPS
2.0000 mg | ORAL_CAPSULE | ORAL | Status: DC | PRN
  Administered 2019-12-05: 2 mg via ORAL
  Filled 2019-12-04 (×2): qty 1

## 2019-12-04 NOTE — Progress Notes (Signed)
[facility]  ROUNDING NOTE   Subjective:  Patient completed hemodialysis yesterday. Tolerated well. Resting comfortably in bed at the moment.    Objective:  Vital signs in last 24 hours:  Temp:  [97.8 F (36.6 C)-98.6 F (37 C)] 97.8 F (36.6 C) (11/14 1252) Pulse Rate:  [55-79] 70 (11/14 1252) Resp:  [17-20] 17 (11/14 1252) BP: (90-122)/(44-65) 93/65 (11/14 1252) SpO2:  [99 %-100 %] 100 % (11/14 1252) Weight:  [96.2 kg] 96.2 kg (11/14 0329)  Weight change: 3.308 kg Filed Weights   12/02/19 0500 12/03/19 0627 12/04/19 0329  Weight: 93.6 kg 92.9 kg 96.2 kg    Intake/Output: I/O last 3 completed shifts: In: 1200 [P.O.:1200] Out: 850 [Urine:200; Other:500; Stool:150]   Intake/Output this shift:  Total I/O In: 360 [P.O.:360] Out: -   Physical Exam: General: No acute distress,flat affect  Head: Oral mucous membranes moist  Eyes: Anicteric  Lungs:  Lungs clear to auscultation bilaterally, normal effort  Heart: Irregular  Abdomen:  Soft, non tender,non distended  Extremities: 1+ peripheral edema.on left leg  Neurologic: Awake, alert, conversant  Skin: Rt leg with dressing   Access: Left AVG    Basic Metabolic Panel: Recent Labs  Lab 11/29/19 1646 11/29/19 1646 12/01/19 1100 12/01/19 1100 12/02/19 0727 12/03/19 0723 12/04/19 0453  NA 138  --  139  --  137 135 133*  K 3.5  --  4.4  --  4.6 5.1 4.8  CL 99  --  99  --  97* 96* 95*  CO2 27  --  25  --  29 29 28   GLUCOSE 99  --  75  --  80 66* 103*  BUN 26*  --  42*  --  33* 44* 34*  CREATININE 3.38*  --  5.06*  --  4.69* 5.55* 4.37*  CALCIUM 8.9   < > 9.1   < > 9.1 8.9 8.6*  MG 1.8  --  1.8  --  1.9 1.9 1.8  PHOS 2.8  --  4.4  --  4.3 5.1* 4.7*   < > = values in this interval not displayed.    Liver Function Tests: Recent Labs  Lab 11/29/19 1646 12/01/19 1100 12/02/19 0727 12/03/19 0723 12/04/19 0453  ALBUMIN 2.7* 2.7* 2.6* 2.5* 2.5*   No results for input(s): LIPASE, AMYLASE in the  last 168 hours. No results for input(s): AMMONIA in the last 168 hours.  CBC: Recent Labs  Lab 11/28/19 1037 12/01/19 1100 12/02/19 0727 12/03/19 0723 12/04/19 0453  WBC 18.5* 21.8* 15.7* 12.5* 10.8*  NEUTROABS  --   --   --  10.0* 8.8*  HGB 10.1* 9.7* 9.0* 8.8* 8.6*  HCT 31.3* 31.3* 29.4* 28.0* 27.8*  MCV 92.1 96.0 98.3 96.2 97.9  PLT 196 133* 108* 101* 104*    Cardiac Enzymes: No results for input(s): CKTOTAL, CKMB, CKMBINDEX, TROPONINI in the last 168 hours.  BNP: Invalid input(s): POCBNP  CBG: No results for input(s): GLUCAP in the last 168 hours.  Microbiology: Results for orders placed or performed during the hospital encounter of 11/17/19  Wound or Superficial Culture     Status: Abnormal   Collection Time: 11/17/19  2:03 PM   Specimen: Wound  Result Value Ref Range Status   Specimen Description   Final    WOUND Performed at [facility], [address] [place], [place] [address]    Special Requests   Final    RL Performed at [facility], [phone] [place], [place] [address]    Gram Stain   Final    NO WBC SEEN ABUNDANT GRAM NEGATIVE RODS FEW GRAM POSITIVE COCCI Performed at [facility], [place] [address] [place], [place] [address]    Culture MULTIPLE ORGANISMS PRESENT, NONE PREDOMINANT (A)  Final   Report Status 11/20/2019 FINAL  Final  Respiratory Panel by RT PCR (Flu A&B, Covid) - Nasopharyngeal Swab     Status: None   Collection Time: 11/17/19  2:05 PM   Specimen: Nasopharyngeal Swab  Result Value Ref Range Status   SARS Coronavirus 2 by RT PCR NEGATIVE NEGATIVE Final    Comment: (NOTE) SARS-CoV-2 target nucleic acids are NOT DETECTED.  The SARS-CoV-2 RNA is generally detectable in upper respiratoy specimens during the acute phase of infection. The lowest concentration of SARS-CoV-2 viral copies this assay can detect is 131 copies/mL. A negative result does not preclude SARS-Cov-2 infection and should not be  used as the sole basis for treatment or other patient management decisions. A negative result may occur with  improper specimen collection/handling, submission of specimen other than nasopharyngeal swab, presence of viral mutation(s) within the areas targeted by this assay, and inadequate number of viral copies (<131 copies/mL). A negative result must be combined with clinical observations, patient history, and epidemiological information. The expected result is Negative.  Fact Sheet for Patients:  PinkCheek.be  Fact Sheet for Healthcare Providers:  GravelBags.it  This test is no t yet approved or cleared by the [place] FDA and  has been authorized for detection and/or diagnosis of SARS-CoV-2 by FDA under an Emergency Use Authorization (EUA). This EUA will remain  in effect (meaning this test can be used) for the duration of the COVID-19 declaration under Section 564(b)(1) of the Act, 21 U.S.C. section 360bbb-3(b)(1), unless the authorization is terminated or revoked sooner.     Influenza A by PCR NEGATIVE NEGATIVE Final   Influenza B by PCR NEGATIVE NEGATIVE Final    Comment: (NOTE) The Xpert Xpress SARS-CoV-2/FLU/RSV assay is intended as an aid in  the diagnosis of influenza from Nasopharyngeal swab specimens and  should not be used as a sole basis for treatment. Nasal washings and  aspirates are unacceptable for Xpert Xpress SARS-CoV-2/FLU/RSV  testing.  Fact Sheet for Patients: PinkCheek.be  Fact Sheet for Healthcare Providers: GravelBags.it  This test is not yet approved or cleared by the [place] FDA and  has been authorized for detection and/or diagnosis of SARS-CoV-2 by  FDA under an Emergency Use Authorization (EUA). This EUA will remain  in effect (meaning this test can be used) for the duration of the  Covid-19 declaration under Section  564(b)(1) of the Act, 21  U.S.C. section 360bbb-3(b)(1), unless the authorization is  terminated or revoked. Performed at [facility], [address] [place], [place] [address]   Blood Culture (routine x 2)     Status: None   Collection Time: 11/17/19  2:06 PM   Specimen: BLOOD  Result Value Ref Range Status   Specimen Description BLOOD BLOOD RIGHT HAND  Final   Special Requests   Final    BOTTLES DRAWN AEROBIC AND ANAEROBIC Blood Culture results may not be optimal due to an inadequate volume of blood received in culture bottles   Culture   Final    NO GROWTH 5 DAYS Performed at [facility], [address] [place], [place] [address]    Report Status 11/22/2019 FINAL  Final  Blood Culture (  routine x 2)     Status: None   Collection Time: 11/17/19  2:06 PM   Specimen: BLOOD  Result Value Ref Range Status   Specimen Description BLOOD BLOOD RIGHT HAND  Final   Special Requests   Final    BOTTLES DRAWN AEROBIC AND ANAEROBIC Blood Culture adequate volume   Culture   Final    NO GROWTH 5 DAYS Performed at [facility], [address] [place], [place] [address]    Report Status 11/22/2019 FINAL  Final  MRSA PCR Screening     Status: Abnormal   Collection Time: 11/18/19  1:03 AM   Specimen: Nasopharyngeal  Result Value Ref Range Status   MRSA by PCR POSITIVE (A) NEGATIVE Corrected    Comment:        The GeneXpert MRSA Assay (FDA approved for NASAL specimens only), is one component of a comprehensive MRSA colonization surveillance program. It is not intended to diagnose MRSA infection nor to guide or monitor treatment for MRSA infections. RESULT CALLED TO, READ BACK BY AND VERIFIED WITH: CYNTHIA VASQUEZ @0245  ON 11/18/19 SKL Performed at [facility], [address] [place], [place] [address] CORRECTED ON 10/29 AT 0249: PREVIOUSLY REPORTED AS POSITIVE        The GeneXpert MRSA Assay (FDA approved for NASAL specimens only), is one  component of a comprehensive MRSA colonization surveillance program. It is not intended to diagnose MRSA infection  nor to guide or monitor treatment for MRSA infections. CYNTHIA VASQUEZ @0245  ON 11/18/19 SKL   Resp Panel by RT PCR (RSV, Flu A&B, Covid) - Nasopharyngeal Swab     Status: None   Collection Time: 11/29/19  2:13 PM   Specimen: Nasopharyngeal Swab  Result Value Ref Range Status   SARS Coronavirus 2 by RT PCR NEGATIVE NEGATIVE Final    Comment: (NOTE) SARS-CoV-2 target nucleic acids are NOT DETECTED.  The SARS-CoV-2 RNA is generally detectable in upper respiratoy specimens during the acute phase of infection. The lowest concentration of SARS-CoV-2 viral copies this assay can detect is 131 copies/mL. A negative result does not preclude SARS-Cov-2 infection and should not be used as the sole basis for treatment or other patient management decisions. A negative result may occur with  improper specimen collection/handling, submission of specimen other than nasopharyngeal swab, presence of viral mutation(s) within the areas targeted by this assay, and inadequate number of viral copies (<131 copies/mL). A negative result must be combined with clinical observations, patient history, and epidemiological information. The expected result is Negative.  Fact Sheet for Patients:  PinkCheek.be  Fact Sheet for Healthcare Providers:  GravelBags.it  This test is no t yet approved or cleared by the [place] FDA and  has been authorized for detection and/or diagnosis of SARS-CoV-2 by FDA under an Emergency Use Authorization (EUA). This EUA will remain  in effect (meaning this test can be used) for the duration of the COVID-19 declaration under Section 564(b)(1) of the Act, 21 U.S.C. section 360bbb-3(b)(1), unless the authorization is terminated or revoked sooner.     Influenza A by PCR NEGATIVE NEGATIVE Final   Influenza B  by PCR NEGATIVE NEGATIVE Final    Comment: (NOTE) The Xpert Xpress SARS-CoV-2/FLU/RSV assay is intended as an aid in  the diagnosis of influenza from Nasopharyngeal swab specimens and  should not be used as a sole basis for treatment. Nasal washings and  aspirates are unacceptable for Xpert Xpress SARS-CoV-2/FLU/RSV  testing.  Fact Sheet for Patients: PinkCheek.be  Fact Sheet for Healthcare Providers: GravelBags.it  This test is not yet approved or cleared by the [place] FDA and  has been authorized for detection and/or diagnosis of SARS-CoV-2 by  FDA under an Emergency Use Authorization (EUA). This EUA will remain  in effect (meaning this test can be used) for the duration of the  Covid-19 declaration under Section 564(b)(1) of the Act, 21  U.S.C. section 360bbb-3(b)(1), unless the authorization is  terminated or revoked.    Respiratory Syncytial Virus by PCR NEGATIVE NEGATIVE Final    Comment: (NOTE) Fact Sheet for Patients: PinkCheek.be  Fact Sheet for Healthcare Providers: GravelBags.it  This test is not yet approved or cleared by the [place] FDA and  has been authorized for detection and/or diagnosis of SARS-CoV-2 by  FDA under an Emergency Use Authorization (EUA). This EUA will remain  in effect (meaning this test can be used) for the duration of the  COVID-19 declaration under Section 564(b)(1) of the Act, 21 U.S.C.  section 360bbb-3(b)(1), unless the authorization is terminated or  revoked. Performed at [facility], [address] [place], [place] [address]   Aerobic Culture (superficial specimen)     Status: None   Collection Time: 11/29/19  5:23 PM   Specimen: Wound  Result Value Ref Range Status   Specimen Description   Final    WOUND Performed at [facility], [address] [place], [place] [address]     Special Requests   Final    RIGHT LEG Performed at [facility], [address] [place],  [place] [address]    Gram Stain   Final    FEW WBC PRESENT, PREDOMINANTLY PMN RARE GRAM POSITIVE COCCI Performed at [facility], [place] [address], [place], [place] [address]    Culture RARE PSEUDOMONAS AERUGINOSA  Final   Report Status 12/02/2019 FINAL  Final   Organism ID, Bacteria PSEUDOMONAS AERUGINOSA  Final      Susceptibility   Pseudomonas aeruginosa - MIC*    CEFTAZIDIME >=64 RESISTANT Resistant     CIPROFLOXACIN <=0.25 SENSITIVE Sensitive     GENTAMICIN <=1 SENSITIVE Sensitive     IMIPENEM 2 SENSITIVE Sensitive     CEFEPIME 16 RESISTANT Resistant     * RARE PSEUDOMONAS AERUGINOSA    Coagulation Studies: No results for input(s): LABPROT, INR in the last 72 hours.  Urinalysis: No results for input(s): COLORURINE, LABSPEC, PHURINE, GLUCOSEU, HGBUR, BILIRUBINUR, KETONESUR, PROTEINUR, UROBILINOGEN, NITRITE, LEUKOCYTESUR in the last 72 hours.  Invalid input(s): APPERANCEUR    Imaging: No results found.   Medications:   . sodium chloride 250 mL (12/02/19 1653)  . sodium chloride Stopped (11/23/19 1105)  . meropenem (MERREM) IV Stopped (12/03/19 1554)   . aspirin EC  81 mg Oral Daily  . Chlorhexidine Gluconate Cloth  6 each Topical Daily  . epoetin (EPOGEN/PROCRIT) injection  10,000 Units Intravenous Q M,W,F-HD  . feeding supplement (NEPRO CARB STEADY)  237 mL Oral BID BM  . ferrous sulfate  325 mg Oral BID  . heparin  5,000 Units Subcutaneous Q8H  . mouth rinse  15 mL Mouth Rinse BID  . midodrine  10 mg Oral TID  . multivitamin  1 tablet Oral QHS  . pantoprazole  40 mg Oral BID  . predniSONE  10 mg Oral Q breakfast  . sodium chloride flush  3 mL Intravenous Q12H  . tamsulosin  0.8 mg Oral Daily   sodium chloride, acetaminophen **OR** acetaminophen, atropine, dextrose, HYDROcodone-acetaminophen, loperamide,  loperamide, melatonin, ondansetron (ZOFRAN) IV,  sodium chloride, sodium chloride flush, traZODone  Assessment/ Plan:  [name] is a 49 y.o. black male with end stage renal disease on hemodialysis, history of kidney transplant, lupus nephritis, and hypotension who was admitted to [facility] on 11/17/2019 for Cellulitis of right lower extremity [A26.333] ESRD on hemodialysis ([place]) [N18.6, Z99.2] Hypotension [I95.9] Severe sepsis ([place]) [A41.9, R65.20]  [facility] [place]/ Left AVG/TTS  # End stage renal disease.  CRRT from 10/30-10/31, Then 11/1-11/2  -Patient completed dialysis treatment yesterday.  No acute indication for dialysis today.  Next Alysis treatment on Tuesday.  #Anemia with chronic kidney disease.   Lab Results  Component Value Date   HGB 8.6 (L) 12/04/2019  Change Epogen to 10,000 units q. TTS.  #Secondary Hyperparathyroidism Lab Results  Component Value Date   CALCIUM 8.6 (L) 12/04/2019   PHOS 4.7 (H) 12/04/2019  Phosphorus within target range at 4.7.    LOS: 20 Kele Barthelemy Roselle Locus , MD [employer] 11/14/20211:42 PM

## 2019-12-04 NOTE — Progress Notes (Signed)
PROGRESS NOTE  [name] KNL:[id number] DOB: 1970-04-10   PCP: Pcp, No  Patient is from: Home.  DOA: 11/17/2019 LOS: 79  Chief complaints: Right lower extremity pain and swelling with purulent discharge  Brief Narrative / Interim history: 49 year old male with history of ESRD on HD TTS, previous kidney transplant on prednisone chronically, anemia of renal disease, diverticulosis and GERD mated with right lower extremity cellulitis after he presented with right lower extremity pain, swelling with purulent discharge and associated fever and rigors.  While waiting on.  In ED, became hypotensive requiring pressors and ICU level of care for septic shock secondary to right lower extremity cellulitis.  Eventually stabilized and transferred to [facility] service on 11/23/2019.  He received different IV antibiotics from 10/28-11/7 which includes IV cefepime from 10/28-11/2.  Patient had leukocytosis after initial improvement.  Infectious disease reconsulted.  Superficial wound culture grew Pseudomonas.  Started on ceftazidime on 12/01/2019.  However, Pseudomonas resistant to ceftazidime.  Started on meropenem on 12/02/2019.  Leukocytosis improving.   Subjective: Seen and examined earlier this morning.  No major events overnight of this morning.  Right leg pain feels better.  No other complaints.  Denies chest pain, dyspnea and GI symptoms.  Objective: Vitals:   12/04/19 0329 12/04/19 0531 12/04/19 0759 12/04/19 1020  BP: (!) 90/55 (!) 104/53 (!) 106/58 (!) 99/49  Pulse: 68  66 (!) 55  Resp: 18  18 17   Temp: 98.4 F (36.9 C)  98.5 F (36.9 C) 98.2 F (36.8 C)  TempSrc: Oral  Oral Oral  SpO2: 100%  100% 100%  Weight: 96.2 kg     Height:        Intake/Output Summary (Last 24 hours) at 12/04/2019 1246 Last data filed at 12/04/2019 0951 Gross per 24 hour  Intake 960 ml  Output 500 ml  Net 460 ml   Filed Weights   12/02/19 0500 12/03/19 0627 12/04/19 0329  Weight: 93.6 kg 92.9 kg 96.2  kg    Examination:  GENERAL: No apparent distress.  Nontoxic. HEENT: MMM.  Vision and hearing grossly intact.  NECK: Supple.  No apparent JVD.  RESP:  No IWOB.  Fair aeration bilaterally. CVS:  RRR.  2/6 SEM over RUSB. ABD/GI/GU: BS+. Abd soft, NTND.  MSK/EXT:  Moves extremities. No apparent deformity.  Plus pitting edema bilaterally. SKIN: no apparent skin lesion or wound NEURO: Awake, alert and oriented appropriately.  No apparent focal neuro deficit. PSYCH: Somewhat upset fluid     Procedures:  None  Microbiology summarized: 10/28-Limited RVP nonreactive. 10/29-MRSA PCR positive. 10/28-superficial wound culture with multiple species 10/28-blood cultures NGTD. 11/9-superficial wound culture with Pseudomonas resistant to ceftazidime and cefepime  Assessment & Plan: Septic shock secondary to severe right lower extremity cellulitis-CT right leg on 10/28 revealed diffuse and marked subcutaneous soft tissue swelling/edema/fluid suggesting severe cellulitis but no discrete drainable abscess.  Clinically improved.  Has been afebrile.  Hemodynamically stable.  However, patient has rising leukocytosis since 11/9.  Repeat superficial wound culture positive for Pseudomonas resistant to ceftazidime and cefepime.  Wound appears clean without signs of abscess or drainable fluid on exam. -IV meropenem on 11/12>>> per ID.  Will ask ID to clarify duration and need for central line.  Leukocytosis:  WBC 9.5>>18.5>>22>> 10.8   likely due to the above.  Improving. -Antibiotics as above.  Wide-complex tachycardia/sinus node dysfunction/paroxysmal A. Fib: CHA2DS2-VASc score 0.  Evaluated by cardiology neurology during this hospitalization.  He has intermittent sinus pauses as long as 6 secs  but not symptomatic.  Hemodynamically stable.  He is not on nodal blocking agents. -Discussed with cardiology, Dr. Garen Lah on 11/10-no further intervention.  No indication for pacemaker. -Optimize  electrolytes  Acute metabolic encephalopathy: Likely due to sepsis and renal failure.  Resolved. -Reorientation and delirium precautions  Hypotension: Soft blood pressures with diastolic in 62H -Increase midodrine to 10 mg  ESRD on HD MWF -Per nephrology.  Received HD today.  S/p renal transplant: On chronic prednisone. -Continue home prednisone after taper  Anemia of renal disease: Stable. -Per nephrology  Chronic pain syndrome -Continue current regimen.  GERD: -Continue PPI  Noncompliance: Improved. -Encouraged and counseled.  Debility/physical deconditioning: -PT/OT recommended CIR.  Body mass index is 27.23 kg/m.        Pressure skin injury: Stage I. Pressure Injury 11/29/19 Heel Right Deep Tissue Pressure Injury - Purple or maroon localized area of discolored intact skin or blood-filled blister due to damage of underlying soft tissue from pressure and/or shear. (Active)  11/29/19 0945  Location: Heel  Location Orientation: Right  Staging: Deep Tissue Pressure Injury - Purple or maroon localized area of discolored intact skin or blood-filled blister due to damage of underlying soft tissue from pressure and/or shear.  Wound Description (Comments):   Present on Admission: No   DVT prophylaxis:  heparin injection 5,000 Units Start: 11/17/19 2200  Code Status: Full code. Family Communication: Patient and/or RN.  Status is: Inpatient  Remains inpatient appropriate because:Unsafe d/c plan, IV treatments appropriate due to intensity of illness or inability to take PO and Inpatient level of care appropriate due to severity of illness   Dispo: The patient is from: Home              Anticipated d/c is to: CIR              Anticipated d/c date is: 2 days once cleared by ID              Patient currently is not medically stable to d/c.       Consultants:  Nephrology PCCM-signed off Infectious disease   Sch Meds:  Scheduled Meds: . aspirin EC  81 mg Oral  Daily  . Chlorhexidine Gluconate Cloth  6 each Topical Daily  . epoetin (EPOGEN/PROCRIT) injection  10,000 Units Intravenous Q M,W,F-HD  . feeding supplement (NEPRO CARB STEADY)  237 mL Oral BID BM  . ferrous sulfate  325 mg Oral BID  . heparin  5,000 Units Subcutaneous Q8H  . mouth rinse  15 mL Mouth Rinse BID  . midodrine  10 mg Oral TID  . multivitamin  1 tablet Oral QHS  . pantoprazole  40 mg Oral BID  . predniSONE  10 mg Oral Q breakfast  . sodium chloride flush  3 mL Intravenous Q12H  . tamsulosin  0.8 mg Oral Daily   Continuous Infusions: . sodium chloride 250 mL (12/02/19 1653)  . sodium chloride Stopped (11/23/19 1105)  . meropenem (MERREM) IV Stopped (12/03/19 1554)   PRN Meds:.sodium chloride, acetaminophen **OR** acetaminophen, atropine, dextrose, HYDROcodone-acetaminophen, loperamide, loperamide, melatonin, ondansetron (ZOFRAN) IV, sodium chloride, sodium chloride flush, traZODone  Antimicrobials: Anti-infectives (From admission, onward)   Start     Dose/Rate Route Frequency Ordered Stop   12/03/19 1200  cefTAZidime (FORTAZ) 1 g in sodium chloride 0.9 % 100 mL IVPB  Status:  Discontinued        1 g 200 mL/hr over 30 Minutes Intravenous Every T-Th-Sa (Hemodialysis) 12/01/19 1442 12/02/19 1405   12/02/19  1600  meropenem (MERREM) 500 mg in sodium chloride 0.9 % 100 mL IVPB        500 mg 200 mL/hr over 30 Minutes Intravenous Every 24 hours 12/02/19 1448     12/02/19 1430  ciprofloxacin (CIPRO) tablet 250 mg  Status:  Discontinued        250 mg Oral Daily with breakfast 12/02/19 1315 12/02/19 1416   12/01/19 1600  cefTAZidime (FORTAZ) 2 g in sodium chloride 0.9 % 100 mL IVPB        2 g 200 mL/hr over 30 Minutes Intravenous  Once 12/01/19 1442 12/01/19 1826   11/25/19 2200  ceFAZolin (ANCEF) IVPB 1 g/50 mL premix        1 g 100 mL/hr over 30 Minutes Intravenous Every 12 hours 11/25/19 1426 11/27/19 2137   11/23/19 1800  ceFAZolin (ANCEF) IVPB 1 g/50 mL premix  Status:   Discontinued        1 g 100 mL/hr over 30 Minutes Intravenous Every 24 hours 11/23/19 1222 11/25/19 1426   11/22/19 2200  ceFAZolin (ANCEF) IVPB 2g/100 mL premix  Status:  Discontinued        2 g 200 mL/hr over 30 Minutes Intravenous Every 12 hours 11/22/19 1136 11/23/19 1222   11/19/19 2000  vancomycin (VANCOCIN) IVPB 1000 mg/200 mL premix  Status:  Discontinued        1,000 mg 200 mL/hr over 60 Minutes Intravenous Every 24 hours 11/19/19 1422 11/21/19 1107   11/19/19 1800  ceFEPIme (MAXIPIME) 2 g in sodium chloride 0.9 % 100 mL IVPB  Status:  Discontinued        2 g 200 mL/hr over 30 Minutes Intravenous Every 12 hours 11/19/19 1422 11/22/19 1135   11/19/19 1200  vancomycin (VANCOCIN) IVPB 1000 mg/200 mL premix  Status:  Discontinued        1,000 mg 200 mL/hr over 60 Minutes Intravenous Every T-Th-Sa (Hemodialysis) 11/17/19 1836 11/18/19 1444   11/18/19 2000  ceFEPIme (MAXIPIME) 2 g in sodium chloride 0.9 % 100 mL IVPB  Status:  Discontinued        2 g 200 mL/hr over 30 Minutes Intravenous Every 24 hours 11/18/19 0154 11/18/19 0858   11/18/19 2000  ceFEPIme (MAXIPIME) 1 g in sodium chloride 0.9 % 100 mL IVPB  Status:  Discontinued        1 g 200 mL/hr over 30 Minutes Intravenous Every 24 hours 11/18/19 0858 11/19/19 1422   11/18/19 1500  cefTRIAXone (ROCEPHIN) 1 g in sodium chloride 0.9 % 100 mL IVPB  Status:  Discontinued        1 g 200 mL/hr over 30 Minutes Intravenous Every 24 hours 11/17/19 1800 11/17/19 2207   11/18/19 1442  vancomycin variable dose per unstable renal function (pharmacist dosing)  Status:  Discontinued         Does not apply See admin instructions 11/18/19 1444 11/19/19 1422   11/17/19 2245  ceFEPIme (MAXIPIME) 2 g in sodium chloride 0.9 % 100 mL IVPB        2 g 200 mL/hr over 30 Minutes Intravenous  Once 11/17/19 2214 11/18/19 0156   11/17/19 2200  clindamycin (CLEOCIN) IVPB 600 mg  Status:  Discontinued        600 mg 100 mL/hr over 30 Minutes Intravenous Every  8 hours 11/17/19 2011 11/18/19 2013   11/17/19 2000  vancomycin (VANCOREADY) IVPB 750 mg/150 mL  Status:  Discontinued        750 mg 150 mL/hr  over 60 Minutes Intravenous Every 24 hours 11/17/19 1824 11/17/19 1832   11/17/19 1545  piperacillin-tazobactam (ZOSYN) IVPB 3.375 g  Status:  Discontinued        3.375 g 100 mL/hr over 30 Minutes Intravenous  Once 11/17/19 1535 11/17/19 1544   11/17/19 1545  clindamycin (CLEOCIN) IVPB 900 mg  Status:  Discontinued        900 mg 100 mL/hr over 30 Minutes Intravenous  Once 11/17/19 1535 11/17/19 1544   11/17/19 1500  vancomycin (VANCOREADY) IVPB 1250 mg/250 mL  Status:  Discontinued        1,250 mg 166.7 mL/hr over 90 Minutes Intravenous  Once 11/17/19 1410 11/17/19 1411   11/17/19 1415  vancomycin (VANCOCIN) IVPB 1000 mg/200 mL premix        1,000 mg 200 mL/hr over 60 Minutes Intravenous  Once 11/17/19 1401 11/17/19 1825   11/17/19 1415  cefTRIAXone (ROCEPHIN) 2 g in sodium chloride 0.9 % 100 mL IVPB        2 g 200 mL/hr over 30 Minutes Intravenous  Once 11/17/19 1401 11/17/19 1558       I have personally reviewed the following labs and images: CBC: Recent Labs  Lab 11/28/19 1037 12/01/19 1100 12/02/19 0727 12/03/19 0723 12/04/19 0453  WBC 18.5* 21.8* 15.7* 12.5* 10.8*  NEUTROABS  --   --   --  10.0* 8.8*  HGB 10.1* 9.7* 9.0* 8.8* 8.6*  HCT 31.3* 31.3* 29.4* 28.0* 27.8*  MCV 92.1 96.0 98.3 96.2 97.9  PLT 196 133* 108* 101* 104*   BMP &GFR Recent Labs  Lab 11/29/19 1646 12/01/19 1100 12/02/19 0727 12/03/19 0723 12/04/19 0453  NA 138 139 137 135 133*  K 3.5 4.4 4.6 5.1 4.8  CL 99 99 97* 96* 95*  CO2 27 25 29 29 28   GLUCOSE 99 75 80 66* 103*  BUN 26* 42* 33* 44* 34*  CREATININE 3.38* 5.06* 4.69* 5.55* 4.37*  CALCIUM 8.9 9.1 9.1 8.9 8.6*  MG 1.8 1.8 1.9 1.9 1.8  PHOS 2.8 4.4 4.3 5.1* 4.7*   Estimated Creatinine Clearance: 23.5 mL/min (A) (by C-G formula based on SCr of 4.37 mg/dL (H)). Liver & Pancreas: Recent Labs  Lab  11/29/19 1646 12/01/19 1100 12/02/19 0727 12/03/19 0723 12/04/19 0453  ALBUMIN 2.7* 2.7* 2.6* 2.5* 2.5*   No results for input(s): LIPASE, AMYLASE in the last 168 hours. No results for input(s): AMMONIA in the last 168 hours. Diabetic: No results for input(s): HGBA1C in the last 72 hours. No results for input(s): GLUCAP in the last 168 hours. Cardiac Enzymes: No results for input(s): CKTOTAL, CKMB, CKMBINDEX, TROPONINI in the last 168 hours. No results for input(s): PROBNP in the last 8760 hours. Coagulation Profile: No results for input(s): INR, PROTIME in the last 168 hours. Thyroid Function Tests: No results for input(s): TSH, T4TOTAL, FREET4, T3FREE, THYROIDAB in the last 72 hours. Lipid Profile: No results for input(s): CHOL, HDL, LDLCALC, TRIG, CHOLHDL, LDLDIRECT in the last 72 hours. Anemia Panel: No results for input(s): VITAMINB12, FOLATE, FERRITIN, TIBC, IRON, RETICCTPCT in the last 72 hours. Urine analysis: No results found for: COLORURINE, APPEARANCEUR, LABSPEC, PHURINE, GLUCOSEU, HGBUR, BILIRUBINUR, KETONESUR, PROTEINUR, UROBILINOGEN, NITRITE, LEUKOCYTESUR Sepsis Labs: Invalid input(s): PROCALCITONIN, [place]  Microbiology: Recent Results (from the past 240 hour(s))  Resp Panel by RT PCR (RSV, Flu A&B, Covid) - Nasopharyngeal Swab     Status: None   Collection Time: 11/29/19  2:13 PM   Specimen: Nasopharyngeal Swab  Result Value Ref Range  Status   SARS Coronavirus 2 by RT PCR NEGATIVE NEGATIVE Final    Comment: (NOTE) SARS-CoV-2 target nucleic acids are NOT DETECTED.  The SARS-CoV-2 RNA is generally detectable in upper respiratoy specimens during the acute phase of infection. The lowest concentration of SARS-CoV-2 viral copies this assay can detect is 131 copies/mL. A negative result does not preclude SARS-Cov-2 infection and should not be used as the sole basis for treatment or other patient management decisions. A negative result may occur with   improper specimen collection/handling, submission of specimen other than nasopharyngeal swab, presence of viral mutation(s) within the areas targeted by this assay, and inadequate number of viral copies (<131 copies/mL). A negative result must be combined with clinical observations, patient history, and epidemiological information. The expected result is Negative.  Fact Sheet for Patients:  PinkCheek.be  Fact Sheet for Healthcare Providers:  GravelBags.it  This test is no t yet approved or cleared by the [place] FDA and  has been authorized for detection and/or diagnosis of SARS-CoV-2 by FDA under an Emergency Use Authorization (EUA). This EUA will remain  in effect (meaning this test can be used) for the duration of the COVID-19 declaration under Section 564(b)(1) of the Act, 21 U.S.C. section 360bbb-3(b)(1), unless the authorization is terminated or revoked sooner.     Influenza A by PCR NEGATIVE NEGATIVE Final   Influenza B by PCR NEGATIVE NEGATIVE Final    Comment: (NOTE) The Xpert Xpress SARS-CoV-2/FLU/RSV assay is intended as an aid in  the diagnosis of influenza from Nasopharyngeal swab specimens and  should not be used as a sole basis for treatment. Nasal washings and  aspirates are unacceptable for Xpert Xpress SARS-CoV-2/FLU/RSV  testing.  Fact Sheet for Patients: PinkCheek.be  Fact Sheet for Healthcare Providers: GravelBags.it  This test is not yet approved or cleared by the [place] FDA and  has been authorized for detection and/or diagnosis of SARS-CoV-2 by  FDA under an Emergency Use Authorization (EUA). This EUA will remain  in effect (meaning this test can be used) for the duration of the  Covid-19 declaration under Section 564(b)(1) of the Act, 21  U.S.C. section 360bbb-3(b)(1), unless the authorization is  terminated or revoked.     Respiratory Syncytial Virus by PCR NEGATIVE NEGATIVE Final    Comment: (NOTE) Fact Sheet for Patients: PinkCheek.be  Fact Sheet for Healthcare Providers: GravelBags.it  This test is not yet approved or cleared by the [place] FDA and  has been authorized for detection and/or diagnosis of SARS-CoV-2 by  FDA under an Emergency Use Authorization (EUA). This EUA will remain  in effect (meaning this test can be used) for the duration of the  COVID-19 declaration under Section 564(b)(1) of the Act, 21 U.S.C.  section 360bbb-3(b)(1), unless the authorization is terminated or  revoked. Performed at [facility], [address], [place], [place] [address]   Aerobic Culture (superficial specimen)     Status: None   Collection Time: 11/29/19  5:23 PM   Specimen: Wound  Result Value Ref Range Status   Specimen Description   Final    WOUND Performed at [facility], [address], [place], [place] [address]    Special Requests   Final    RIGHT LEG Performed at [facility], [place] [place],  [address]    Gram Stain   Final    FEW WBC PRESENT, PREDOMINANTLY PMN RARE GRAM POSITIVE COCCI Performed at [facility], [place]  [address] [place], [place] [address]    Culture RARE PSEUDOMONAS AERUGINOSA  Final   Report Status 12/02/2019 FINAL  Final   Organism ID, Bacteria PSEUDOMONAS AERUGINOSA  Final      Susceptibility   Pseudomonas aeruginosa - MIC*    CEFTAZIDIME >=64 RESISTANT Resistant     CIPROFLOXACIN <=0.25 SENSITIVE Sensitive     GENTAMICIN <=1 SENSITIVE Sensitive     IMIPENEM 2 SENSITIVE Sensitive     CEFEPIME 16 RESISTANT Resistant     * RARE PSEUDOMONAS AERUGINOSA    Radiology Studies: No results found.   Kayan Blissett T. [place]  If 7PM-7AM, please contact night-coverage [email or web address] 12/04/2019, 12:46 PM

## 2019-12-04 NOTE — Progress Notes (Signed)
Occupational Therapy Treatment Patient Details Name: [name] MRN: [id number] DOB: 03/17/1970 Today's Date: 12/04/2019    History of present illness presented to ER secondary to R LE swelling, pain; admitted for management of severe cellulitis of R LE.  Hospital course additionally significant for hypotension, requiring transfer to CCU and pressor support; episode of vtach with synchronized cardioversion (10/30), CRRT (via temp dialysis cath, left) 10/30-11/2.  L temp fem cath discontinued and patient cleared for participation with session 11/24/19.   OT comments  [name] was seen for OT/PT co-treatment on this date. Upon arrival to room pt pleasant and motivated to participate. Pt has poor insight into deficits and decreased safety awareness requiring +2 for safe mobility and MAX cues t/o session. Pt required MOD A for bed mobility. MAX A don B socks at bed elvel, MOD A don/doff B shoes seated EOB. MIN A + RW for ADL t/f from highly elevated surface - pt refuses to attempt toileting on commode stating "that's too short y'all would have to pick me up off of it." Pt making good progress toward goals. Pt continues to benefit from skilled OT services to maximize return to PLOF and minimize risk of future falls, injury, caregiver burden, and readmission. Will continue to follow POC. Discharge recommendation remains appropriate.    Follow Up Recommendations  CIR;Supervision/Assistance - 24 hour    Equipment Recommendations  Other (comment) (defer to next venue of care)    Recommendations for Other Services      Precautions / Restrictions Precautions Precautions: Fall Precaution Comments: L AVF, R LE wound (ace bandaged) Restrictions Weight Bearing Restrictions: No       Mobility Bed Mobility Overal bed mobility: Needs Assistance Bed Mobility: Supine to Sit;Sit to Supine     Supine to sit: HOB elevated;Mod assist Sit to supine: Mod assist   General bed mobility comments: Mod A  for trunk support to sit upright with tactile cues for sequencing. Assistance for BLE support to return to bed   Transfers Overall transfer level: Needs assistance Equipment used: Rolling walker (2 wheeled) Transfers: Sit to/from Stand Sit to Stand: From elevated surface         General transfer comment: Min A required to stand with bed height in elevated position (per patient request). Anticiapte patient may need +2 person assistance if standing from bed at lowest height. Verbal cues for safety and hand placement     Balance Overall balance assessment: Needs assistance Sitting-balance support: No upper extremity supported;Feet supported Sitting balance-Leahy Scale: Good     Standing balance support: Bilateral upper extremity supported Standing balance-Leahy Scale: Fair Standing balance comment: patient relying heavily on rolling walker for UE support in standing position           ADL either performed or assessed with clinical judgement   ADL Overall ADL's : Needs assistance/impaired        General ADL Comments: MAX A don B socks at bed elvel, MOD A don/doff B shoes seated EOB. MIN A + RW for ADL t/f from highly elevated surface - pt refuses to attempt toileting bc "that's too short y'all would have to pick me up off of it."               Cognition Arousal/Alertness: Awake/alert Behavior During Therapy: WFL for tasks assessed/performed Overall Cognitive Status: Within Functional Limits for tasks assessed      General Comments: Pt is cooperative but requires tasks done his way - requires MAX cues  Exercises Exercises: Other exercises Other Exercises Other Exercises: Pt educated re: d/c recs, DME recs, falls prevention, ECS, importance of mobility for functional strengthening Other Exercises: LBD, sup<>sit, sit<>stand, UBD, sitting/standing balance/toelrance           Pertinent Vitals/ Pain       Pain Assessment: Faces Faces Pain Scale: Hurts a little  bit Pain Location: bilateral foot pain  Pain Descriptors / Indicators: Aching Pain Intervention(s): Limited activity within patient's tolerance         Frequency  Min 2X/week        Progress Toward Goals  OT Goals(current goals can now be found in the care plan section)  Progress towards OT goals: Progressing toward goals  Acute Rehab OT Goals Patient Stated Goal: to go to rehab  OT Goal Formulation: With patient Time For Goal Achievement: 12/08/19 Potential to Achieve Goals: Good ADL Goals Pt Will Perform Grooming: with modified independence;sitting Pt Will Perform Lower Body Dressing: with min assist;sit to/from stand Pt Will Transfer to Toilet: with min assist;bedside commode Pt/caregiver will Perform Home Exercise Program: Increased strength;Both right and left upper extremity;With written HEP provided;With theraband;With Supervision  Plan Discharge plan remains appropriate;Frequency remains appropriate    Co-evaluation    PT/OT/SLP Co-Evaluation/Treatment: Yes Reason for Co-Treatment: Necessary to address cognition/behavior during functional activity;To address functional/ADL transfers;For patient/therapist safety PT goals addressed during session: Mobility/safety with mobility OT goals addressed during session: ADL's and self-care      AM-PAC OT "6 Clicks" Daily Activity     Outcome Measure   Help from another person eating meals?: None Help from another person taking care of personal grooming?: A Little Help from another person toileting, which includes using toliet, bedpan, or urinal?: A Lot Help from another person bathing (including washing, rinsing, drying)?: A Lot Help from another person to put on and taking off regular upper body clothing?: A Little Help from another person to put on and taking off regular lower body clothing?: A Lot 6 Click Score: 16    End of Session Equipment Utilized During Treatment: Rolling walker;Gait belt  OT Visit  Diagnosis: Unsteadiness on feet (R26.81);Muscle weakness (generalized) (M62.81)   Activity Tolerance Patient tolerated treatment well   Patient Left in bed;with call bell/phone within reach;with bed alarm set   Nurse Communication          Time: 6761-9509 OT Time Calculation (min): 30 min  Charges: OT General Charges $OT Visit: 1 Visit OT Treatments $Self Care/Home Management : 8-22 mins  Dessie Coma, M.S. OTR/L  12/04/19, 4:10 PM  ascom [phone]

## 2019-12-04 NOTE — Progress Notes (Addendum)
Physical Therapy Treatment Patient Details Name: [name] MRN: [id number] DOB: March 17, 1970 Today's Date: 12/04/2019    History of Present Illness presented to ER secondary to R LE swelling, pain; admitted for management of severe cellulitis of R LE.  Hospital course additionally significant for hypotension, requiring transfer to CCU and pressor support; episode of vtach with synchronized cardioversion (10/30), CRRT (via temp dialysis cath, left) 10/30-11/2.  L temp fem cath discontinued and patient cleared for participation with session 11/24/19.    PT Comments    Patient is motivated to participate with therapy. Patient eager to walk in the hallway today. Patient continues to require assistance for bed mobility and transfers. Patient directs care at times, and prefers to stand with bed height elevated maximally. Patient likely would need +2 person assistance for standing with bed in lowest position. Patient has bilateral knee hyperextension during gait and needs +2 person assistance for management of equipment. Assistance is required for turns with rolling walker for steadiness. Sp02 99% on 4 L 02 after ambulating. Patient needs continued PT to address functional limitations to maximize independence and return to PLOF. Highly recommend inpatient rehab hospital at discharge in order to meet current rehabilitation needs.    Follow Up Recommendations  CIR     Equipment Recommendations  Rolling walker with 5" wheels;3in1 (PT)    Recommendations for Other Services       Precautions / Restrictions Precautions Precautions: Fall Restrictions Weight Bearing Restrictions: No    Mobility  Bed Mobility Overal bed mobility: Needs Assistance Bed Mobility: Supine to Sit;Sit to Supine     Supine to sit: HOB elevated;Mod assist Sit to supine: Mod assist   General bed mobility comments: Mod A for trunk support to sit upright with tactile cues for sequencing. Assistance for BLE support to  return to bed   Transfers Overall transfer level: Needs assistance Equipment used: Rolling walker (2 wheeled) Transfers: Sit to/from Stand Sit to Stand: From elevated surface         General transfer comment: Min A required to stand with bed height in elevated position (per patient request). Anticiapte patient may need +2 person assistance if standing from bed at lowest height. Verbal cues for safety and hand placement   Ambulation/Gait Ambulation/Gait assistance: +2 safety/equipment;Min assist Gait Distance (Feet): 120 Feet Assistive device: Rolling walker (2 wheeled) Gait Pattern/deviations: Narrow base of support (knee hyperextension bilaterally ) Gait velocity: decreased    General Gait Details: Assistance is required for steadying with turns using rolling walker. Verbal cues for safety. Patient fatigued with activity and continues to demonstrate knee hyperextension with stance phase of gait bilaterally. Sp02 99% on 4 L 02 after walking.    Stairs             Wheelchair Mobility    Modified Rankin (Stroke Patients Only)       Balance Overall balance assessment: Needs assistance Sitting-balance support: No upper extremity supported;Feet supported Sitting balance-Leahy Scale: Good     Standing balance support: Bilateral upper extremity supported Standing balance-Leahy Scale: Fair Standing balance comment: patient relying heavily on rolling walker for UE support in standing position                             Cognition Arousal/Alertness: Awake/alert Behavior During Therapy: WFL for tasks assessed/performed Overall Cognitive Status: Within Functional Limits for tasks assessed  Exercises Other Exercises Other Exercises: quad set performed x 2 sets on BLE. verbal cues for technique.     General Comments        Pertinent Vitals/Pain Faces Pain Scale: Hurts a little bit Pain Location:  bilateral foot pain  Pain Descriptors / Indicators: Aching Pain Intervention(s): Limited activity within patient's tolerance    Home Living                      Prior Function            PT Goals (current goals can now be found in the care plan section) Acute Rehab PT Goals Patient Stated Goal: to go to rehab  PT Goal Formulation: With patient Time For Goal Achievement: 12/08/19 Potential to Achieve Goals: Good Progress towards PT goals: Progressing toward goals    Frequency    Min 2X/week      PT Plan Current plan remains appropriate    Co-evaluation     PT goals addressed during session: Mobility/safety with mobility        AM-PAC PT "6 Clicks" Mobility   Outcome Measure  Help needed turning from your back to your side while in a flat bed without using bedrails?: A Little Help needed moving from lying on your back to sitting on the side of a flat bed without using bedrails?: A Lot Help needed moving to and from a bed to a chair (including a wheelchair)?: A Lot Help needed standing up from a chair using your arms (e.g., wheelchair or bedside chair)?: A Lot Help needed to walk in hospital room?: A Lot Help needed climbing 3-5 steps with a railing? : A Lot 6 Click Score: 13    End of Session Equipment Utilized During Treatment: Gait belt;Oxygen Activity Tolerance: Patient tolerated treatment well Patient left: in bed;with bed alarm set;with call bell/phone within reach Nurse Communication:  (white board updated with mobility status ) PT Visit Diagnosis: Muscle weakness (generalized) (M62.81);Difficulty in walking, not elsewhere classified (R26.2)     Time: 1561-5379 PT Time Calculation (min) (ACUTE ONLY): 42 min  Charges:  $Gait Training: 23-37 mins                     [name], PT, MPT    [name] 12/04/2019, 3:12 PM

## 2019-12-05 ENCOUNTER — Inpatient Hospital Stay
Admission: EM | Admit: 2019-12-05 | Discharge: 2019-12-23 | DRG: 640 | Disposition: A | Discharge status: Rehabilitation Facility | Payer: No Typology Code available for payment source | Attending: Internal Medicine | Admitting: Internal Medicine

## 2019-12-05 ENCOUNTER — Other Ambulatory Visit: Payer: Self-pay

## 2019-12-05 DIAGNOSIS — Z87891 Personal history of nicotine dependence: Secondary | ICD-10-CM

## 2019-12-05 DIAGNOSIS — I480 Paroxysmal atrial fibrillation: Secondary | ICD-10-CM | POA: Diagnosis present

## 2019-12-05 DIAGNOSIS — E875 Hyperkalemia: Secondary | ICD-10-CM | POA: Diagnosis not present

## 2019-12-05 DIAGNOSIS — L03115 Cellulitis of right lower limb: Secondary | ICD-10-CM | POA: Diagnosis present

## 2019-12-05 DIAGNOSIS — N2581 Secondary hyperparathyroidism of renal origin: Secondary | ICD-10-CM | POA: Diagnosis present

## 2019-12-05 DIAGNOSIS — Z992 Dependence on renal dialysis: Secondary | ICD-10-CM

## 2019-12-05 DIAGNOSIS — D631 Anemia in chronic kidney disease: Secondary | ICD-10-CM | POA: Diagnosis present

## 2019-12-05 DIAGNOSIS — D696 Thrombocytopenia, unspecified: Secondary | ICD-10-CM | POA: Diagnosis present

## 2019-12-05 DIAGNOSIS — Z5329 Procedure and treatment not carried out because of patient's decision for other reasons: Secondary | ICD-10-CM | POA: Diagnosis present

## 2019-12-05 DIAGNOSIS — Z7982 Long term (current) use of aspirin: Secondary | ICD-10-CM

## 2019-12-05 DIAGNOSIS — L0889 Other specified local infections of the skin and subcutaneous tissue: Secondary | ICD-10-CM | POA: Diagnosis not present

## 2019-12-05 DIAGNOSIS — Z905 Acquired absence of kidney: Secondary | ICD-10-CM

## 2019-12-05 DIAGNOSIS — I9589 Other hypotension: Secondary | ICD-10-CM | POA: Diagnosis present

## 2019-12-05 DIAGNOSIS — Z8619 Personal history of other infectious and parasitic diseases: Secondary | ICD-10-CM

## 2019-12-05 DIAGNOSIS — Z882 Allergy status to sulfonamides status: Secondary | ICD-10-CM

## 2019-12-05 DIAGNOSIS — K219 Gastro-esophageal reflux disease without esophagitis: Secondary | ICD-10-CM | POA: Diagnosis present

## 2019-12-05 DIAGNOSIS — Z808 Family history of malignant neoplasm of other organs or systems: Secondary | ICD-10-CM

## 2019-12-05 DIAGNOSIS — L03039 Cellulitis of unspecified toe: Secondary | ICD-10-CM

## 2019-12-05 DIAGNOSIS — Y830 Surgical operation with transplant of whole organ as the cause of abnormal reaction of the patient, or of later complication, without mention of misadventure at the time of the procedure: Secondary | ICD-10-CM | POA: Diagnosis present

## 2019-12-05 DIAGNOSIS — Z9114 Patient's other noncompliance with medication regimen: Secondary | ICD-10-CM

## 2019-12-05 DIAGNOSIS — T8611 Kidney transplant rejection: Secondary | ICD-10-CM

## 2019-12-05 DIAGNOSIS — N186 End stage renal disease: Secondary | ICD-10-CM | POA: Diagnosis present

## 2019-12-05 DIAGNOSIS — B965 Pseudomonas (aeruginosa) (mallei) (pseudomallei) as the cause of diseases classified elsewhere: Secondary | ICD-10-CM | POA: Diagnosis present

## 2019-12-05 DIAGNOSIS — K5790 Diverticulosis of intestine, part unspecified, without perforation or abscess without bleeding: Secondary | ICD-10-CM | POA: Diagnosis present

## 2019-12-05 DIAGNOSIS — R600 Localized edema: Secondary | ICD-10-CM | POA: Diagnosis present

## 2019-12-05 DIAGNOSIS — R5381 Other malaise: Secondary | ICD-10-CM

## 2019-12-05 DIAGNOSIS — G894 Chronic pain syndrome: Secondary | ICD-10-CM | POA: Diagnosis present

## 2019-12-05 DIAGNOSIS — L0292 Furuncle, unspecified: Secondary | ICD-10-CM | POA: Diagnosis not present

## 2019-12-05 DIAGNOSIS — Z20822 Contact with and (suspected) exposure to covid-19: Secondary | ICD-10-CM | POA: Diagnosis present

## 2019-12-05 DIAGNOSIS — R3915 Urgency of urination: Secondary | ICD-10-CM | POA: Diagnosis present

## 2019-12-05 DIAGNOSIS — Z79899 Other long term (current) drug therapy: Secondary | ICD-10-CM

## 2019-12-05 DIAGNOSIS — Z8249 Family history of ischemic heart disease and other diseases of the circulatory system: Secondary | ICD-10-CM

## 2019-12-05 DIAGNOSIS — A498 Other bacterial infections of unspecified site: Secondary | ICD-10-CM

## 2019-12-05 DIAGNOSIS — M329 Systemic lupus erythematosus, unspecified: Secondary | ICD-10-CM | POA: Diagnosis present

## 2019-12-05 DIAGNOSIS — R6520 Severe sepsis without septic shock: Secondary | ICD-10-CM

## 2019-12-05 DIAGNOSIS — M3214 Glomerular disease in systemic lupus erythematosus: Secondary | ICD-10-CM | POA: Diagnosis present

## 2019-12-05 DIAGNOSIS — T8612 Kidney transplant failure: Secondary | ICD-10-CM | POA: Diagnosis present

## 2019-12-05 DIAGNOSIS — Z7952 Long term (current) use of systemic steroids: Secondary | ICD-10-CM

## 2019-12-05 LAB — RESPIRATORY PANEL BY RT PCR (FLU A&B, COVID)
Influenza A by PCR: NEGATIVE
Influenza B by PCR: NEGATIVE
SARS Coronavirus 2 by RT PCR: NEGATIVE

## 2019-12-05 LAB — RENAL FUNCTION PANEL
Albumin: 2.6 g/dL — ABNORMAL LOW (ref 3.5–5.0)
Anion gap: 11 (ref 5–15)
BUN: 48 mg/dL — ABNORMAL HIGH (ref 6–20)
CO2: 29 mmol/L (ref 22–32)
Calcium: 9.2 mg/dL (ref 8.9–10.3)
Chloride: 99 mmol/L (ref 98–111)
Creatinine, Ser: 5.36 mg/dL — ABNORMAL HIGH (ref 0.61–1.24)
GFR, Estimated: 12 mL/min — ABNORMAL LOW (ref >60)
Glucose, Bld: 79 mg/dL (ref 70–99)
Phosphorus: 5.8 mg/dL — ABNORMAL HIGH (ref 2.5–4.6)
Potassium: 5.9 mmol/L — ABNORMAL HIGH (ref 3.5–5.1)
Sodium: 139 mmol/L (ref 135–145)

## 2019-12-05 LAB — CBC WITH DIFFERENTIAL/PLATELET
Abs Immature Granulocytes: 0.09 10*3/uL — ABNORMAL HIGH (ref 0.00–0.07)
Abs Immature Granulocytes: 0.11 10*3/uL — ABNORMAL HIGH (ref 0.00–0.07)
Basophils Absolute: 0 10*3/uL (ref 0.0–0.1)
Basophils Absolute: 0 10*3/uL (ref 0.0–0.1)
Basophils Relative: 0 %
Basophils Relative: 0 %
Eosinophils Absolute: 0.2 10*3/uL (ref 0.0–0.5)
Eosinophils Absolute: 0 10*3/uL (ref 0.0–0.5)
Eosinophils Relative: 2 %
Eosinophils Relative: 0 %
HCT: 28.8 % — ABNORMAL LOW (ref 39.0–52.0)
HCT: 28.4 % — ABNORMAL LOW (ref 39.0–52.0)
Hemoglobin: 8.6 g/dL — ABNORMAL LOW (ref 13.0–17.0)
Hemoglobin: 9.2 g/dL — ABNORMAL LOW (ref 13.0–17.0)
Immature Granulocytes: 1 %
Immature Granulocytes: 1 %
Lymphocytes Relative: 9 %
Lymphocytes Relative: 4 %
Lymphs Abs: 0.8 10*3/uL (ref 0.7–4.0)
Lymphs Abs: 0.5 10*3/uL — ABNORMAL LOW (ref 0.7–4.0)
MCH: 29.3 pg (ref 26.0–34.0)
MCH: 30.4 pg (ref 26.0–34.0)
MCHC: 29.9 g/dL — ABNORMAL LOW (ref 30.0–36.0)
MCHC: 32.4 g/dL (ref 30.0–36.0)
MCV: 98 fL (ref 80.0–100.0)
MCV: 93.7 fL (ref 80.0–100.0)
Monocytes Absolute: 0.8 10*3/uL (ref 0.1–1.0)
Monocytes Absolute: 0.7 10*3/uL (ref 0.1–1.0)
Monocytes Relative: 8 %
Monocytes Relative: 6 %
Neutro Abs: 7.8 10*3/uL — ABNORMAL HIGH (ref 1.7–7.7)
Neutro Abs: 10.2 10*3/uL — ABNORMAL HIGH (ref 1.7–7.7)
Neutrophils Relative %: 80 %
Neutrophils Relative %: 89 %
Platelets: 110 10*3/uL — ABNORMAL LOW (ref 150–400)
Platelets: 106 10*3/uL — ABNORMAL LOW (ref 150–400)
RBC: 2.94 MIL/uL — ABNORMAL LOW (ref 4.22–5.81)
RBC: 3.03 MIL/uL — ABNORMAL LOW (ref 4.22–5.81)
RDW: 18.9 % — ABNORMAL HIGH (ref 11.5–15.5)
RDW: 18.8 % — ABNORMAL HIGH (ref 11.5–15.5)
WBC: 9.7 10*3/uL (ref 4.0–10.5)
WBC: 11.5 10*3/uL — ABNORMAL HIGH (ref 4.0–10.5)
nRBC: 0 % (ref 0.0–0.2)
nRBC: 0 % (ref 0.0–0.2)

## 2019-12-05 LAB — DRUG SCREEN 10 W/CONF, SERUM
Amphetamines, IA: NEGATIVE ng/mL
Barbiturates, IA: NEGATIVE ug/mL
Benzodiazepines, IA: NEGATIVE ng/mL
Cocaine & Metabolite, IA: NEGATIVE ng/mL
Methadone, IA: NEGATIVE ng/mL
Opiates, IA: POSITIVE ng/mL — AB
Oxycodones, IA: NEGATIVE ng/mL
Phencyclidine, IA: NEGATIVE ng/mL
Propoxyphene, IA: NEGATIVE ng/mL
THC(Marijuana) Metabolite, IA: NEGATIVE ng/mL

## 2019-12-05 LAB — MAGNESIUM: Magnesium: 2 mg/dL (ref 1.7–2.4)

## 2019-12-05 LAB — OPIATES,MS,WB/SP RFX
6-Acetylmorphine: NEGATIVE
Codeine: NEGATIVE ng/mL
Dihydrocodeine: 3 ng/mL
Hydrocodone: 15.5 ng/mL
Hydromorphone: NEGATIVE ng/mL
Morphine: 3.5 ng/mL
Opiate Confirmation: POSITIVE

## 2019-12-05 LAB — BASIC METABOLIC PANEL
Anion gap: 14 (ref 5–15)
BUN: 54 mg/dL — ABNORMAL HIGH (ref 6–20)
CO2: 24 mmol/L (ref 22–32)
Calcium: 8.6 mg/dL — ABNORMAL LOW (ref 8.9–10.3)
Chloride: 98 mmol/L (ref 98–111)
Creatinine, Ser: 5.95 mg/dL — ABNORMAL HIGH (ref 0.61–1.24)
GFR, Estimated: 11 mL/min — ABNORMAL LOW (ref >60)
Glucose, Bld: 113 mg/dL — ABNORMAL HIGH (ref 70–99)
Potassium: 6.5 mmol/L (ref 3.5–5.1)
Sodium: 136 mmol/L (ref 135–145)

## 2019-12-05 MED ORDER — ACETAMINOPHEN 650 MG RE SUPP: 650.0000 mg | Freq: Four times a day (QID) | RECTAL | Status: DC | PRN

## 2019-12-05 MED ORDER — SODIUM CHLORIDE 0.9 % IV SOLN
500.0000 mg | Freq: Two times a day (BID) | INTRAVENOUS | Status: DC
  Filled 2019-12-05 (×2): qty 0.5

## 2019-12-05 MED ORDER — HEPARIN SODIUM (PORCINE) 5000 UNIT/ML IJ SOLN
5000.0000 [IU] | Freq: Three times a day (TID) | INTRAMUSCULAR | Status: DC
  Administered 2019-12-06 – 2019-12-10 (×5): 5000 [IU] via SUBCUTANEOUS
  Filled 2019-12-05 (×15): qty 1

## 2019-12-05 MED ORDER — HYDROCODONE-ACETAMINOPHEN 10-325 MG PO TABS
1.0000 | ORAL_TABLET | Freq: Three times a day (TID) | ORAL | Status: DC | PRN
  Administered 2019-12-06: 1 via ORAL
  Filled 2019-12-05 (×2): qty 1

## 2019-12-05 MED ORDER — SODIUM CHLORIDE 0.9 % IV SOLN
500.0000 mg | INTRAVENOUS | Status: DC
  Administered 2019-12-06: 500 mg via INTRAVENOUS
  Filled 2019-12-05 (×2): qty 0.5

## 2019-12-05 MED ORDER — PATIROMER SORBITEX CALCIUM 8.4 G PO PACK
16.8000 g | PACK | Freq: Every day | ORAL | Status: DC
  Administered 2019-12-06 (×2): 16.8 g via ORAL
  Filled 2019-12-05 (×9): qty 2

## 2019-12-05 MED ORDER — PREDNISONE 20 MG PO TABS
10.0000 mg | ORAL_TABLET | Freq: Every day | ORAL | Status: DC
  Administered 2019-12-06 – 2019-12-21 (×11): 10 mg via ORAL
  Filled 2019-12-05 (×14): qty 1

## 2019-12-05 MED ORDER — ONDANSETRON HCL 4 MG PO TABS
4.0000 mg | ORAL_TABLET | Freq: Four times a day (QID) | ORAL | Status: DC | PRN
  Administered 2019-12-07: 4 mg via ORAL
  Filled 2019-12-05 (×3): qty 1

## 2019-12-05 MED ORDER — SODIUM CHLORIDE 0.9 % IV SOLN
500.0000 mg | Freq: Once | INTRAVENOUS | Status: DC
  Filled 2019-12-05: qty 0.5

## 2019-12-05 MED ORDER — ONDANSETRON HCL 4 MG/2ML IJ SOLN
4.0000 mg | Freq: Four times a day (QID) | INTRAMUSCULAR | Status: DC | PRN
  Administered 2019-12-06 – 2019-12-19 (×21): 4 mg via INTRAVENOUS
  Filled 2019-12-05 (×23): qty 2

## 2019-12-05 MED ORDER — ACETAMINOPHEN 325 MG PO TABS
650.0000 mg | ORAL_TABLET | Freq: Four times a day (QID) | ORAL | Status: DC | PRN
  Administered 2019-12-06 – 2019-12-07 (×2): 650 mg via ORAL
  Filled 2019-12-05 (×2): qty 2

## 2019-12-05 MED ORDER — ASPIRIN EC 81 MG PO TBEC
81.0000 mg | DELAYED_RELEASE_TABLET | Freq: Every day | ORAL | Status: DC
  Administered 2019-12-06 – 2019-12-23 (×13): 81 mg via ORAL
  Filled 2019-12-05 (×14): qty 1

## 2019-12-05 MED ORDER — SODIUM ZIRCONIUM CYCLOSILICATE 10 G PO PACK
10.0000 g | PACK | Freq: Once | ORAL | Status: DC
  Filled 2019-12-05: qty 1

## 2019-12-05 MED ORDER — CALCIUM GLUCONATE-NACL 1-0.675 GM/50ML-% IV SOLN
1.0000 g | Freq: Once | INTRAVENOUS | Status: AC
  Administered 2019-12-05: 1000 mg via INTRAVENOUS
  Filled 2019-12-05: qty 50

## 2019-12-05 NOTE — Progress Notes (Signed)
Pt has a K of 5.9, MD ordered Lokelma but pt refused. Said he only wants dialysis to lower his K. MD was notified and asked for Telemetry order but pt refused when placed. Educate pt but still refused. MD and charge nurse were informed.

## 2019-12-05 NOTE — Progress Notes (Signed)
[facility]  ROUNDING NOTE   Subjective:    Patient in no acute distress, resting in bed. He reports consuming his breakfast this morning, tolerated well, no nausea or vomiting. He received dialysis treatment on Saturday, will plan for dialysis again  tomorrow.     Objective:  Vital signs in last 24 hours:  Temp:  [97.5 F (36.4 C)-98.4 F (36.9 C)] 98.4 F (36.9 C) (11/15 0750) Pulse Rate:  [61-70] 69 (11/15 0750) Resp:  [16-17] 16 (11/15 0750) BP: (93-124)/(48-65) 101/55 (11/15 0750) SpO2:  [100 %] 100 % (11/15 0750)  Weight change:  Filed Weights   12/02/19 0500 12/03/19 0627 12/04/19 0329  Weight: 93.6 kg 92.9 kg 96.2 kg    Intake/Output: I/O last 3 completed shifts: In: 65 [P.O.:960] Out: -    Intake/Output this shift:  Total I/O In: 480 [P.O.:480] Out: -   Physical Exam: General:  Lying in bed, appears comfortable  Head: Normocephalic,atraumatic  Eyes: Sclerae and conjunctivae clear  Lungs:  Normal and symmetrical respiratory effort, lungs clear  Heart: S1S2, No rubs or gallops, irregular rhythm  Abdomen:  Soft, non tender,non distended  Extremities:  1+ peripheral edema.on left leg,Rt leg wrapped with dressing  Neurologic: Awake,alert, answers questions appropriately  Skin: Rt leg with dressing clean,dry and intact  Access: Left AVG    Basic Metabolic Panel: Recent Labs  Lab 12/01/19 1100 12/01/19 1100 12/02/19 0727 12/02/19 0727 12/03/19 0723 12/04/19 0453 12/05/19 0432  NA 139  --  137  --  135 133* 139  K 4.4  --  4.6  --  5.1 4.8 5.9*  CL 99  --  97*  --  96* 95* 99  CO2 25  --  29  --  29 28 29   GLUCOSE 75  --  80  --  66* 103* 79  BUN 42*  --  33*  --  44* 34* 48*  CREATININE 5.06*  --  4.69*  --  5.55* 4.37* 5.36*  CALCIUM 9.1   < > 9.1   < > 8.9 8.6* 9.2  MG 1.8  --  1.9  --  1.9 1.8 2.0  PHOS 4.4  --  4.3  --  5.1* 4.7* 5.8*   < > = values in this interval not displayed.    Liver Function Tests: Recent Labs  Lab  12/01/19 1100 12/02/19 0727 12/03/19 0723 12/04/19 0453 12/05/19 0432  ALBUMIN 2.7* 2.6* 2.5* 2.5* 2.6*   No results for input(s): LIPASE, AMYLASE in the last 168 hours. No results for input(s): AMMONIA in the last 168 hours.  CBC: Recent Labs  Lab 12/01/19 1100 12/02/19 0727 12/03/19 0723 12/04/19 0453 12/05/19 0432  WBC 21.8* 15.7* 12.5* 10.8* 9.7  NEUTROABS  --   --  10.0* 8.8* 7.8*  HGB 9.7* 9.0* 8.8* 8.6* 8.6*  HCT 31.3* 29.4* 28.0* 27.8* 28.8*  MCV 96.0 98.3 96.2 97.9 98.0  PLT 133* 108* 101* 104* 110*    Cardiac Enzymes: No results for input(s): CKTOTAL, CKMB, CKMBINDEX, TROPONINI in the last 168 hours.  BNP: Invalid input(s): POCBNP  CBG: No results for input(s): GLUCAP in the last 168 hours.  Microbiology: Results for orders placed or performed during the hospital encounter of 11/17/19  Wound or Superficial Culture     Status: Abnormal   Collection Time: 11/17/19  2:03 PM   Specimen: Wound  Result Value Ref Range Status   Specimen Description   Final    WOUND Performed at [facility], [address], [place], [place] [address]    Special Requests   Final    RL Performed at [facility], [place], [place] [address]    Gram Stain   Final    NO WBC SEEN ABUNDANT GRAM NEGATIVE RODS FEW GRAM POSITIVE COCCI Performed at [facility], [place] [address] [place], [place] [address]    Culture MULTIPLE ORGANISMS PRESENT, NONE PREDOMINANT (A)  Final   Report Status 11/20/2019 FINAL  Final  Respiratory Panel by RT PCR (Flu A&B, Covid) - Nasopharyngeal Swab     Status: None   Collection Time: 11/17/19  2:05 PM   Specimen: Nasopharyngeal Swab  Result Value Ref Range Status   SARS Coronavirus 2 by RT PCR NEGATIVE NEGATIVE Final    Comment: (NOTE) SARS-CoV-2 target nucleic acids are NOT DETECTED.  The SARS-CoV-2 RNA is generally detectable in upper respiratoy specimens during the acute phase of infection. The  lowest concentration of SARS-CoV-2 viral copies this assay can detect is 131 copies/mL. A negative result does not preclude SARS-Cov-2 infection and should not be used as the sole basis for treatment or other patient management decisions. A negative result may occur with  improper specimen collection/handling, submission of specimen other than nasopharyngeal swab, presence of viral mutation(s) within the areas targeted by this assay, and inadequate number of viral copies (<131 copies/mL). A negative result must be combined with clinical observations, patient history, and epidemiological information. The expected result is Negative.  Fact Sheet for Patients:  PinkCheek.be  Fact Sheet for Healthcare Providers:  GravelBags.it  This test is no t yet approved or cleared by the [place] FDA and  has been authorized for detection and/or diagnosis of SARS-CoV-2 by FDA under an Emergency Use Authorization (EUA). This EUA will remain  in effect (meaning this test can be used) for the duration of the COVID-19 declaration under Section 564(b)(1) of the Act, 21 U.S.C. section 360bbb-3(b)(1), unless the authorization is terminated or revoked sooner.     Influenza A by PCR NEGATIVE NEGATIVE Final   Influenza B by PCR NEGATIVE NEGATIVE Final    Comment: (NOTE) The Xpert Xpress SARS-CoV-2/FLU/RSV assay is intended as an aid in  the diagnosis of influenza from Nasopharyngeal swab specimens and  should not be used as a sole basis for treatment. Nasal washings and  aspirates are unacceptable for Xpert Xpress SARS-CoV-2/FLU/RSV  testing.  Fact Sheet for Patients: PinkCheek.be  Fact Sheet for Healthcare Providers: GravelBags.it  This test is not yet approved or cleared by the [place] FDA and  has been authorized for detection and/or diagnosis of SARS-CoV-2 by  FDA under  an Emergency Use Authorization (EUA). This EUA will remain  in effect (meaning this test can be used) for the duration of the  Covid-19 declaration under Section 564(b)(1) of the Act, 21  U.S.C. section 360bbb-3(b)(1), unless the authorization is  terminated or revoked. Performed at [facility], [address] [place], [place] [address]   Blood Culture (routine x 2)     Status: None   Collection Time: 11/17/19  2:06 PM   Specimen: BLOOD  Result Value Ref Range Status   Specimen Description BLOOD BLOOD RIGHT HAND  Final   Special Requests   Final    BOTTLES DRAWN AEROBIC AND ANAEROBIC Blood Culture results may not be optimal due to an inadequate volume of blood received in culture bottles   Culture   Final    NO GROWTH  5 DAYS Performed at [facility], [address] [place], [place] [address]    Report Status 11/22/2019 FINAL  Final  Blood Culture (routine x 2)     Status: None   Collection Time: 11/17/19  2:06 PM   Specimen: BLOOD  Result Value Ref Range Status   Specimen Description BLOOD BLOOD RIGHT HAND  Final   Special Requests   Final    BOTTLES DRAWN AEROBIC AND ANAEROBIC Blood Culture adequate volume   Culture   Final    NO GROWTH 5 DAYS Performed at [facility], [address] [place], [place] [address]    Report Status 11/22/2019 FINAL  Final  MRSA PCR Screening     Status: Abnormal   Collection Time: 11/18/19  1:03 AM   Specimen: Nasopharyngeal  Result Value Ref Range Status   MRSA by PCR POSITIVE (A) NEGATIVE Corrected    Comment:        The GeneXpert MRSA Assay (FDA approved for NASAL specimens only), is one component of a comprehensive MRSA colonization surveillance program. It is not intended to diagnose MRSA infection nor to guide or monitor treatment for MRSA infections. RESULT CALLED TO, READ BACK BY AND VERIFIED WITH: CYNTHIA VASQUEZ @0245  ON 11/18/19 SKL Performed at [facility], [address]  [place], [place] [address] CORRECTED ON 10/29 AT 0249: PREVIOUSLY REPORTED AS POSITIVE        The GeneXpert MRSA Assay (FDA approved for NASAL specimens only), is one component of a comprehensive MRSA colonization surveillance program. It is not intended to diagnose MRSA infection  nor to guide or monitor treatment for MRSA infections. CYNTHIA VASQUEZ @0245  ON 11/18/19 SKL   Resp Panel by RT PCR (RSV, Flu A&B, Covid) - Nasopharyngeal Swab     Status: None   Collection Time: 11/29/19  2:13 PM   Specimen: Nasopharyngeal Swab  Result Value Ref Range Status   SARS Coronavirus 2 by RT PCR NEGATIVE NEGATIVE Final    Comment: (NOTE) SARS-CoV-2 target nucleic acids are NOT DETECTED.  The SARS-CoV-2 RNA is generally detectable in upper respiratoy specimens during the acute phase of infection. The lowest concentration of SARS-CoV-2 viral copies this assay can detect is 131 copies/mL. A negative result does not preclude SARS-Cov-2 infection and should not be used as the sole basis for treatment or other patient management decisions. A negative result may occur with  improper specimen collection/handling, submission of specimen other than nasopharyngeal swab, presence of viral mutation(s) within the areas targeted by this assay, and inadequate number of viral copies (<131 copies/mL). A negative result must be combined with clinical observations, patient history, and epidemiological information. The expected result is Negative.  Fact Sheet for Patients:  PinkCheek.be  Fact Sheet for Healthcare Providers:  GravelBags.it  This test is no t yet approved or cleared by the [place] FDA and  has been authorized for detection and/or diagnosis of SARS-CoV-2 by FDA under an Emergency Use Authorization (EUA). This EUA will remain  in effect (meaning this test can be used) for the duration of the COVID-19 declaration under Section 564(b)(1) of the  Act, 21 U.S.C. section 360bbb-3(b)(1), unless the authorization is terminated or revoked sooner.     Influenza A by PCR NEGATIVE NEGATIVE Final   Influenza B by PCR NEGATIVE NEGATIVE Final    Comment: (NOTE) The Xpert Xpress SARS-CoV-2/FLU/RSV assay is intended as an aid in  the diagnosis of influenza from Nasopharyngeal swab specimens and  should not be used  as a sole basis for treatment. Nasal washings and  aspirates are unacceptable for Xpert Xpress SARS-CoV-2/FLU/RSV  testing.  Fact Sheet for Patients: PinkCheek.be  Fact Sheet for Healthcare Providers: GravelBags.it  This test is not yet approved or cleared by the [place] FDA and  has been authorized for detection and/or diagnosis of SARS-CoV-2 by  FDA under an Emergency Use Authorization (EUA). This EUA will remain  in effect (meaning this test can be used) for the duration of the  Covid-19 declaration under Section 564(b)(1) of the Act, 21  U.S.C. section 360bbb-3(b)(1), unless the authorization is  terminated or revoked.    Respiratory Syncytial Virus by PCR NEGATIVE NEGATIVE Final    Comment: (NOTE) Fact Sheet for Patients: PinkCheek.be  Fact Sheet for Healthcare Providers: GravelBags.it  This test is not yet approved or cleared by the [place] FDA and  has been authorized for detection and/or diagnosis of SARS-CoV-2 by  FDA under an Emergency Use Authorization (EUA). This EUA will remain  in effect (meaning this test can be used) for the duration of the  COVID-19 declaration under Section 564(b)(1) of the Act, 21 U.S.C.  section 360bbb-3(b)(1), unless the authorization is terminated or  revoked. Performed at [facility], [address], [place], [place] [address]   Aerobic Culture (superficial specimen)     Status: None   Collection Time: 11/29/19  5:23 PM   Specimen: Wound   Result Value Ref Range Status   Specimen Description   Final    WOUND Performed at [facility], [address] [place], [place] [address]    Special Requests   Final    RIGHT LEG Performed at [facility], [address] [place], [place] [address]    Gram Stain   Final    FEW WBC PRESENT, PREDOMINANTLY PMN RARE GRAM POSITIVE COCCI Performed at [facility], [place] [address], [place],  [address]    Culture RARE PSEUDOMONAS AERUGINOSA  Final   Report Status 12/02/2019 FINAL  Final   Organism ID, Bacteria PSEUDOMONAS AERUGINOSA  Final      Susceptibility   Pseudomonas aeruginosa - MIC*    CEFTAZIDIME >=64 RESISTANT Resistant     CIPROFLOXACIN <=0.25 SENSITIVE Sensitive     GENTAMICIN <=1 SENSITIVE Sensitive     IMIPENEM 2 SENSITIVE Sensitive     CEFEPIME 16 RESISTANT Resistant     * RARE PSEUDOMONAS AERUGINOSA    Coagulation Studies: No results for input(s): LABPROT, INR in the last 72 hours.  Urinalysis: No results for input(s): COLORURINE, LABSPEC, PHURINE, GLUCOSEU, HGBUR, BILIRUBINUR, KETONESUR, PROTEINUR, UROBILINOGEN, NITRITE, LEUKOCYTESUR in the last 72 hours.  Invalid input(s): APPERANCEUR    Imaging: No results found.   Medications:   . sodium chloride 250 mL (12/02/19 1653)  . sodium chloride Stopped (11/23/19 1105)  . meropenem (MERREM) IV 500 mg (12/04/19 1718)   . aspirin EC  81 mg Oral Daily  . Chlorhexidine Gluconate Cloth  6 each Topical Daily  . [START ON 12/06/2019] epoetin (EPOGEN/PROCRIT) injection  10,000 Units Intravenous Q T,Th,Sa-HD  . feeding supplement (NEPRO CARB STEADY)  237 mL Oral BID BM  . ferrous sulfate  325 mg Oral BID  . heparin  5,000 Units Subcutaneous Q8H  . mouth rinse  15 mL Mouth Rinse BID  . midodrine  10 mg Oral TID  . multivitamin  1 tablet Oral QHS  . pantoprazole  40 mg Oral BID  . predniSONE  10 mg Oral Q  breakfast  . sodium chloride flush  3 mL Intravenous Q12H  . sodium  zirconium cyclosilicate  10 g Oral Once  . tamsulosin  0.8 mg Oral Daily   sodium chloride, acetaminophen **OR** acetaminophen, atropine, dextrose, HYDROcodone-acetaminophen, loperamide, loperamide, melatonin, ondansetron (ZOFRAN) IV, sodium chloride, sodium chloride flush, traZODone  Assessment/ Plan:  [name] is a 49 y.o. black male with end stage renal disease on hemodialysis, history of kidney transplant, lupus nephritis, and hypotension who was admitted to [facility] on 11/17/2019 for Cellulitis of right lower extremity [Z61.096] ESRD on hemodialysis ([place]) [N18.6, Z99.2] Hypotension [I95.9] Severe sepsis ([place]) [A41.9, R65.20]  [facility] [place]/ Left AVG/TTS  # End stage renal disease.  CRRT from 10/30-10/31, Then 11/1-11/2 Out patient dialysis center arrangement at [place],TTS 5.45am Patient received dialysis treatment on Saturday Volume status acceptable No additional dialysis required today  #Anemia with chronic kidney disease.   Lab Results  Component Value Date   HGB 8.6 (L) 12/05/2019   Patient is on Epogen with dialysis   #Secondary Hyperparathyroidism Lab Results  Component Value Date   CALCIUM 9.2 12/05/2019   PHOS 5.8 (H) 12/05/2019  Will continue monitoring bone mineral metabolism parameters.  #Hyperkalemia Potassium 5.9 today Getting treated with [facility] Will continue monitoring closely    LOS: 18 [name] 11/15/202111:35 AM  I

## 2019-12-05 NOTE — ED Triage Notes (Signed)
Pt to ED via [facility] from home for "readmission to finish treatment" after leaving AMA today. States he left because he was not going to rehab soon enough and thought he could do dialysis out patient, was refused d/t not having recent covid test. States he needs dialysis and wants to be readmitted to finish antiobiotics from infection to right leg.  Right leg is wrapped on arrival.  Swelling noted to right arm, pt states the swelling had gone down this morning before leaving but he has not had it elevated today.  Pt on 4L [place]

## 2019-12-05 NOTE — Consult Note (Signed)
Pharmacy Antibiotic Note  [name] is a 49 y.o. male admitted on 11/17/2019 with cellulitis. Patient was started on ceftazidime, but culture came back resistant. Patient with ciprofloxacin allergy - skin on hands peeled off "in sheets."  Patient is on HD TTS. Pharmacy has been consulted for meropenem dosing for pseudomonas in leg wound. Day 4 meropenem, WBC down to 9.7 from 21.8 on 11/11.   Plan:  Continue meropenem 500mg  IV q24h based on culture and susceptibility results   Height: 6\' 2"  (188 cm) Weight: 96.2 kg (212 lb 1.6 oz) IBW/kg (Calculated) : 82.2  Temp (24hrs), Avg:98 F (36.7 C), Min:97.5 F (36.4 C), Max:98.4 F (36.9 C)  Recent Labs  Lab 12/01/19 1100 12/02/19 0727 12/03/19 0723 12/04/19 0453 12/05/19 0432  WBC 21.8* 15.7* 12.5* 10.8* 9.7  CREATININE 5.06* 4.69* 5.55* 4.37* 5.36*    Estimated Creatinine Clearance: 19.2 mL/min (A) (by C-G formula based on SCr of 5.36 mg/dL (H)).    Allergies  Allergen Reactions  . Ciprofloxacin Rash    skin on hands peeled off "in sheets."  . Sulfa Antibiotics Rash    Antimicrobials this admission: Ongoing 11/12 meropenem 500mg  IV q24h Completed 11/ 11-11 ceftazadime 1g TTS   Microbiology results: 11/9 Superficial wound culture: Susceptibility  Pseudomonas aeruginosa (ZZ00)  Antibiotic Interpretation Microscan Method Status  CEFTAZIDIME Resistant >=64 RESISTANT MIC Final  CIPROFLOXACIN Sensitive <=0.25 SENSITIVE MIC Final  GENTAMICIN Sensitive <=1 SENSITIVE MIC Final  IMIPENEM Sensitive 2 SENSITIVE MIC Final  CEFEPIME Resistant 16 RESISTANT MIC Final  Susceptibility Comments  RARE PSEUDOMONAS AERUGINOSA    Thank you for allowing pharmacy to be a part of this patient's care.  Sherilyn Banker, PharmD Pharmacy Resident  12/05/2019 12:51 PM

## 2019-12-05 NOTE — H&P (Addendum)
History and Physical    RUSH SALCE DGU:[id number] DOB: [birth year] DOA: 12/05/2019  PCP: Pcp, No   Patient coming from: Home  I have personally briefly reviewed patient's old medical records in [place]  Chief Complaint: Unable to get dialysis  HPI: [name] is a 49 y.o. 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  He denied chest pain or shortness of breath since leaving the hospital.  Denied fever or chills. ED Course: On arrival, temperature 97.6, BP 105/55, heart rate 80 RR 22 with O2 sat 98% on room air.  His blood work revealed potassium of 6.5.  CBC showed WBC of 11.5, slightly more elevated than with discharge around 8.7, hemoglobin 9.2, slightly improved from baseline. EKG as reviewed by me : Normal sinus rhythm with no acute ST-T wave changes changes  Review of Systems: As per HPI otherwise all other systems on review of systems negative.    Past Medical History:  Diagnosis Date  . Anemia   . Collagen vascular disease ([place])   . Lupus ([place])   . Renal disorder   . Renal insufficiency   . Renal transplant recipient     Past Surgical History:  Procedure Laterality Date  . NEPHRECTOMY TRANSPLANTED ORGAN       reports that he has quit smoking. His smoking use included cigarettes. He has a 6.50 pack-year smoking history. He has never used smokeless tobacco. He reports current alcohol use. He reports that he does not use drugs.  Allergies  Allergen Reactions  . Ciprofloxacin Rash    skin  on hands peeled off "in sheets."  . Sulfa Antibiotics Rash    Family History  Problem Relation Age of Onset  . Hypertension Other   . Brain cancer Mother        Died at age 5  . Aneurysm Mother   . CAD Father   . Heart attack Father 45      Prior to Admission medications   Medication Sig Start Date End Date Taking? Authorizing Provider  aspirin EC 81 MG tablet Take 81 mg by mouth daily.    [provider]  epoetin alfa (EPOGEN) 10000 UNIT/ML injection Inject 1 mL (10,000 Units total) into the vein every Monday, Wednesday, and Friday with hemodialysis. 12/02/19   Mercy Riding, MD  ferrous sulfate 324 (65 FE) MG TBEC Take 1 tablet (325 mg total) by mouth 2 (two) times daily. 12/10/14   Vaughan Basta, MD  HYDROcodone-acetaminophen (NORCO) 10-325 MG tablet Take 1 tablet by mouth every 8 (eight) hours as needed. Do Not Fill Before 11/21/2019 11/15/19   Bayard Hugger, NP  midodrine (PROAMATINE) 5 MG tablet Take 1 tablet (5 mg total) by mouth 3 (three) times daily. 12/01/19   Mercy Riding, MD  multivitamin (RENA-VIT) TABS tablet Take 1 tablet by mouth at bedtime. 12/01/19   Mercy Riding, MD  Nutritional Supplements (FEEDING SUPPLEMENT, NEPRO CARB STEADY,) LIQD Take 237 mLs by mouth 2 (two) times daily between meals. 12/01/19   Cyndia Skeeters,  Charlesetta Ivory, MD  pantoprazole (PROTONIX) 40 MG tablet Take 1 tablet (40 mg total) by mouth 2 (two) times daily. 12/10/14   Vaughan Basta, MD  predniSONE (DELTASONE) 10 MG tablet Take 10 mg by mouth daily. 11/02/19   [provider]  promethazine (PHENERGAN) 25 MG tablet Take 25 mg by mouth every 6 (six) hours as needed for nausea or vomiting.    [provider]  tamsulosin (FLOMAX) 0.4 MG CAPS capsule Take 0.8 mg by mouth as needed.     [provider]  traZODone (DESYREL) 50 MG tablet Take 50 mg by mouth at bedtime.    [provider]    Physical Exam: Vitals:   12/05/19 1754 12/05/19 1756  12/05/19 1757 12/05/19 2144  BP: (!) 105/55   (!) 111/58  Pulse:  80  93  Resp: (!) 22   18  Temp: 97.8 F (36.6 C)     TempSrc: Oral     SpO2: 93%   98%  Weight:   95.3 kg   Height:   6\' 2"  (1.88 m)      Vitals:   12/05/19 1754 12/05/19 1756 12/05/19 1757 12/05/19 2144  BP: (!) 105/55   (!) 111/58  Pulse:  80  93  Resp: (!) 22   18  Temp: 97.8 F (36.6 C)     TempSrc: Oral     SpO2: 93%   98%  Weight:   95.3 kg   Height:   6\' 2"  (1.88 m)       Constitutional: Alert and oriented x 3 . Not in any apparent distress HEENT:      Head: Normocephalic and atraumatic.         Eyes: PERLA, EOMI, Conjunctivae are normal. Sclera is non-icteric.       Mouth/Throat: Mucous membranes are moist.       Neck: Supple with no signs of meningismus. Cardiovascular: Regular rate and rhythm. No murmurs, gallops, or rubs. 2+ symmetrical distal pulses are present . No JVD. 2+edema both feet Respiratory: Respiratory effort normal .Lungs sounds clear bilaterally. No wheezes, crackles, or rhonchi.  Gastrointestinal: Soft, non tender, and non distended with positive bowel sounds. No rebound or guarding. Genitourinary: No CVA tenderness. Musculoskeletal: Nontender with normal range of motion in all extremities. No cyanosis, or erythema of extremities. Neurologic:  Face is symmetric. Moving all extremities. No gross focal neurologic deficits . Skin: ulcer on mid right shin 2x5 cm with fat layer exposed Psychiatric: Mood and affect are normal    Labs on Admission: I have personally reviewed following labs and imaging studies  CBC: Recent Labs  Lab 12/02/19 0727 12/03/19 0723 12/04/19 0453 12/05/19 0432 12/05/19 1827  WBC 15.7* 12.5* 10.8* 9.7 11.5*  NEUTROABS  --  10.0* 8.8* 7.8* 10.2*  HGB 9.0* 8.8* 8.6* 8.6* 9.2*  HCT 29.4* 28.0* 27.8* 28.8* 28.4*  MCV 98.3 96.2 97.9 98.0 93.7  PLT 108* 101* 104* 110* 440*   Basic Metabolic Panel: Recent Labs  Lab 12/01/19 1100 12/01/19 1100  12/02/19 0727 12/03/19 0723 12/04/19 0453 12/05/19 0432 12/05/19 1827  NA 139   < > 137 135 133* 139 136  K 4.4   < > 4.6 5.1 4.8 5.9* 6.5*  CL 99   < > 97* 96* 95* 99 98  CO2 25   < > 29 29 28 29 24   GLUCOSE 75   < > 80 66* 103* 79 113*  BUN 42*   < > 33* 44* 34*  48* 54*  CREATININE 5.06*   < > 4.69* 5.55* 4.37* 5.36* 5.95*  CALCIUM 9.1   < > 9.1 8.9 8.6* 9.2 8.6*  MG 1.8  --  1.9 1.9 1.8 2.0  --   PHOS 4.4  --  4.3 5.1* 4.7* 5.8*  --    < > = values in this interval not displayed.   GFR: Estimated Creatinine Clearance: 17.3 mL/min (A) (by C-G formula based on SCr of 5.95 mg/dL (H)). Liver Function Tests: Recent Labs  Lab 12/01/19 1100 12/02/19 0727 12/03/19 0723 12/04/19 0453 12/05/19 0432  ALBUMIN 2.7* 2.6* 2.5* 2.5* 2.6*   No results for input(s): LIPASE, AMYLASE in the last 168 hours. No results for input(s): AMMONIA in the last 168 hours. Coagulation Profile: No results for input(s): INR, PROTIME in the last 168 hours. Cardiac Enzymes: No results for input(s): CKTOTAL, CKMB, CKMBINDEX, TROPONINI in the last 168 hours. BNP (last 3 results) No results for input(s): PROBNP in the last 8760 hours. HbA1C: No results for input(s): HGBA1C in the last 72 hours. CBG: No results for input(s): GLUCAP in the last 168 hours. Lipid Profile: No results for input(s): CHOL, HDL, LDLCALC, TRIG, CHOLHDL, LDLDIRECT in the last 72 hours. Thyroid Function Tests: No results for input(s): TSH, T4TOTAL, FREET4, T3FREE, THYROIDAB in the last 72 hours. Anemia Panel: No results for input(s): VITAMINB12, FOLATE, FERRITIN, TIBC, IRON, RETICCTPCT in the last 72 hours. Urine analysis: No results found for: COLORURINE, APPEARANCEUR, LABSPEC, PHURINE, GLUCOSEU, HGBUR, BILIRUBINUR, KETONESUR, PROTEINUR, UROBILINOGEN, NITRITE, LEUKOCYTESUR  Radiological Exams on Admission: No results found.   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  Found to be hyperkalemic     Hyperkalemia   ESRD on hemodialysis [facility]) with history of failed renal transplant -Patient received calcium gluconate and Veltassa in the emergency room -Continue to monitor potassium every 4 hours -For dialysis in the a.m. per Dr. Holley Raring    Pseudomonas aeruginosa wound infection   Cellulitis of right lower extremity   Wound right lower leg -Patient completed 4/5 days of meropenem. -Patient was seen by ID with plans for meropenem from 11/12-11/16 -Pharmacy consult for meropenem -Wound care and keep leg elevated -Patient voices interest in going to rehab    Systemic lupus erythematosus, unspecified ([place]) -Continue prednisone    DVT prophylaxis: Heparin Code Status: full code  Family Communication:  none  Disposition Plan: Possibly to rehab Consults called: Dr. Holley Raring Status: Observation    Athena Masse MD [facility]     12/05/2019, 10:43 PM

## 2019-12-05 NOTE — ED Provider Notes (Signed)
[facility] Emergency Department Provider Note  ____________________________________________   I have reviewed the triage vital signs and the nursing notes.   HISTORY  Chief Complaint Needs dialysis  History limited by: Not Limited   HPI [name] is a 49 y.o. male who presents to the emergency department today because he was unable to obtain outpatient dialysis.  Patient states that they required a Covid negative test prior to giving him his dialysis.  Patient did leave AMA from the hospital earlier today.  While in the hospital he was obtaining IV antibiotics for wound infection.  He denies any chest pain shortness of breath since leaving the hospital.  Records reviewed. Per medical record review patient has a history of admission to the hospital with discharge AMA today.  Patient was being treated for a wound infection.  Patient did have elevated potassium earlier today.  Past Medical History:  Diagnosis Date  . Anemia   . Collagen vascular disease ([place])   . Lupus ([place])   . Renal disorder   . Renal insufficiency   . Renal transplant recipient     Patient Active Problem List   Diagnosis Date Noted  . Paroxysmal atrial fibrillation (H[facility])   . Severe sepsis ([place]) 11/17/2019  . Cellulitis of right lower extremity 11/17/2019  . Lactic acidosis 11/17/2019  . Hypoglycemia 11/17/2019  . Anemia   . GERD (gastroesophageal reflux disease)   . ESRD (end stage renal disease) ([place])   . Murmur 03/25/2018  . Laboratory examination 03/25/2018  . Tendonitis of both rotator cuffs 12/29/2017  . Greater trochanteric bursitis of both hips 12/29/2017  . Diverticulitis of large intestine without perforation or abscess without bleeding   . Diverticulitis 07/01/2017  . 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 At risk for sepsis 09/08/2015  . GI bleed 12/09/2014    Past Surgical History:  Procedure Laterality Date  .  NEPHRECTOMY TRANSPLANTED ORGAN      Prior to Admission medications   Medication Sig Start Date End Date Taking? Authorizing Provider  aspirin EC 81 MG tablet Take 81 mg by mouth daily.    [provider]  epoetin alfa (EPOGEN) 10000 UNIT/ML injection Inject 1 mL (10,000 Units total) into the vein every Monday, Wednesday, and Friday with hemodialysis. 12/02/19   Mercy Riding, MD  ferrous sulfate 324 (65 FE) MG TBEC Take 1 tablet (325 mg total) by mouth 2 (two) times daily. 12/10/14   Vaughan Basta, MD  HYDROcodone-acetaminophen (NORCO) 10-325 MG tablet Take 1 tablet by mouth every 8 (eight) hours as needed. Do Not Fill Before 11/21/2019 11/15/19   Bayard Hugger, NP  midodrine (PROAMATINE) 5 MG tablet Take 1 tablet (5 mg total) by mouth 3 (three) times daily. 12/01/19   Mercy Riding, MD  multivitamin (RENA-VIT) TABS tablet Take 1 tablet by mouth at bedtime. 12/01/19   Mercy Riding, MD  Nutritional Supplements (FEEDING SUPPLEMENT, NEPRO CARB STEADY,) LIQD Take 237 mLs by mouth 2 (two) times daily between meals. 12/01/19   Mercy Riding, MD  pantoprazole (PROTONIX) 40 MG tablet Take 1 tablet (40 mg total) by mouth 2 (two) times daily. 12/10/14   Vaughan Basta, MD  predniSONE (DELTASONE) 10 MG tablet Take 10 mg by mouth daily. 11/02/19   [provider]  promethazine (PHENERGAN) 25 MG tablet Take 25 mg by mouth every 6 (six) hours as needed for nausea or vomiting.    [provider]  tamsulosin (FLOMAX) 0.4 MG CAPS capsule Take 0.8 mg by mouth as needed.     [provider]  traZODone (DESYREL) 50 MG tablet Take 50 mg by mouth at bedtime.    [provider]    Allergies Ciprofloxacin and Sulfa antibiotics  Family History  Problem Relation Age of Onset  . Hypertension Other   . Brain cancer Mother        Died at age 10  . Aneurysm Mother   . CAD Father   . Heart attack Father 74    Social History Social History    Tobacco Use  . Smoking status: Former Smoker    Packs/day: 0.50    Years: 13.00    Pack years: 6.50    Types: Cigarettes  . Smokeless tobacco: Never Used  Vaping Use  . Vaping Use: Never used  Substance Use Topics  . Alcohol use: Yes    Comment: Occasional  . Drug use: No    Review of Systems Constitutional: No fever/chills Eyes: No visual changes. ENT: No sore throat. Cardiovascular: Denies chest pain. Respiratory: Denies shortness of breath. Gastrointestinal: No abdominal pain.  No nausea, no vomiting.  No diarrhea.   Genitourinary: Negative for dysuria. Musculoskeletal: Positive for leg pain. Skin: Negative for rash. Neurological: Negative for headaches, focal weakness or numbness.  ____________________________________________   PHYSICAL EXAM:  VITAL SIGNS: ED Triage Vitals  Enc Vitals Group     BP 12/05/19 1754 (!) 105/55     Pulse Rate 12/05/19 1756 80     Resp 12/05/19 1754 (!) 22     Temp 12/05/19 1754 97.8 F (36.6 C)     Temp Source 12/05/19 1754 Oral     SpO2 12/05/19 1754 93 %     Weight 12/05/19 1757 210 lb (95.3 kg)     Height 12/05/19 1757 6\' 2"  (1.88 m)     Head Circumference --      Peak Flow --      Pain Score 12/05/19 1757 8   Constitutional: Alert and oriented.  Eyes: Conjunctivae are normal.  ENT      Head: Normocephalic and atraumatic.      Nose: No congestion/rhinnorhea.      Mouth/Throat: Mucous membranes are moist.      Neck: No stridor. Hematological/Lymphatic/Immunilogical: No cervical lymphadenopathy. Cardiovascular: Normal rate, regular rhythm.  No murmurs, rubs, or gallops. Respiratory: Normal respiratory effort without tachypnea nor retractions. Breath sounds are clear and equal bilaterally. No wheezes/rales/rhonchi. Gastrointestinal: Soft and non tender. No rebound. No guarding.  Genitourinary: Deferred Musculoskeletal: Normal range of motion in all extremities. No lower extremity edema. Neurologic:  Normal speech and  language. No gross focal neurologic deficits are appreciated.  Skin:  Wound to right shin. No significant erythema or pus appreciated. Psychiatric: Mood and affect are normal. Speech and behavior are normal. Patient exhibits appropriate insight and judgment.  ____________________________________________    LABS (pertinent positives/negatives)  CBC wbc 11.5, hgb 9.2, plt 106 BMP na 136, k 6.5, glu 113, cr 5.95  ____________________________________________   EKG  I, Nance Pear, attending physician, personally viewed and interpreted this EKG  EKG Time: 1800 Rate: 64 Rhythm: atrial flutter with variable av block Axis: normal Intervals: qtc 425 QRS: incomplete RBBB ST changes: no st elevation Impression: abnormal ekg  ____________________________________________    RADIOLOGY  None  ____________________________________________   PROCEDURES  Procedures  ____________________________________________   INITIAL IMPRESSION / ASSESSMENT AND PLAN / ED COURSE  Pertinent labs & imaging results that  were available during my care of the patient were reviewed by me and considered in my medical decision making (see chart for details).   Patient presented to the emergency department today because of inability to obtain dialysis.  Patient left AMA from the hospital earlier today.  Blood work does show elevated potassium.  EKG without any obvious changes.  Case discussed with Dr. Holley Raring.  Will give calcium and Veltassa.  Will plan on admission to the hospital service. Patient was also being treated for an infection at his recent hospitalization, however it appears it was a daily dosing medication. Discussed with hospitalist for admission. Discussed plan with patient.   ____________________________________________   FINAL CLINICAL IMPRESSION(S) / ED DIAGNOSES  Final diagnoses:  Hyperkalemia     Note: This dictation was prepared with Dragon dictation. Any transcriptional  errors that result from this process are unintentional     Nance Pear, MD 12/05/19 2235

## 2019-12-05 NOTE — Progress Notes (Signed)
  PT Cancellation Note  Patient Details Name: [name] MRN: [id number] DOB: 04-Mar-1970   Cancelled Treatment:    Reason Eval/Treat Not Completed: Other (comment). Pt with elevated K+ levels at 5.9, not appropriate for PT session this date. Will hold and re-attempt next date.   Ralonda Tartt 12/05/2019, 8:56 AM  Greggory Stallion, PT, DPT [phone]

## 2019-12-05 NOTE — ED Notes (Signed)
Lab to lobby to draw blood work.  Per phlebotomy, they were able to stick pt, but his blood is mixed with "fluid" because he is swollen.  They stated the blood would be no good for results.  Will defer bloodwork at this time.

## 2019-12-05 NOTE — Progress Notes (Signed)
Pt just left AMA. MD was informed. AMA paper was signed by pt. PT felt with his son.

## 2019-12-05 NOTE — Discharge Summary (Signed)
Physician Discharge Summary  CHAPMAN MATTEUCCI ZTI:[id number] DOB: 06/16/1970 DOA: 11/17/2019  PCP: Pcp, No  Admit date: 11/17/2019 Discharge date: 12/05/2019  Admitted From: Home Disposition: Left AGAINST MEDICAL ADVICE  Recommendations for Outpatient Follow-up:  1. Follow up on right lower extremity wound/cellulitis 2. Please obtain CBC/BMP/Mag at follow up 3. Please follow up on the following pending results: None  Discharge Condition: Stable CODE STATUS: Full code   Follow-up Information    Vickie Epley, MD On 12/21/2019.   Specialties: Cardiology, Radiology Why: @ 9:20am Contact information: [place] [address] [phone]                Hospital Course: 49 year old male with history of ESRD on HD TTS, previous kidney transplant on prednisone chronically, anemia of renal disease, diverticulosis and GERD mated with right lower extremity cellulitis after he presented with right lower extremity pain, swelling with purulent discharge and associated fever and rigors.  While waiting on.  In ED, became hypotensive requiring pressors and ICU level of care for septic shock secondary to right lower extremity cellulitis.  Eventually stabilized and transferred to [facility] service on 11/23/2019.  He received different IV antibiotics from 10/28-11/7 which includes IV cefepime from 10/28-11/2.  Patient had leukocytosis after initial improvement.  Infectious disease reconsulted.  Superficial wound culture grew Pseudomonas.  Started on ceftazidime on 12/01/2019.  However, Pseudomonas resistant to ceftazidime.  Started on meropenem on 12/02/2019.  Leukocytosis resolved.   Patient has been noncompliant with care throughout hospitalization.  He threatened to leave [place] multiple times.  He signed AMA paper on 11/30/2019 but changed his mind and stayed, until 12/05/2019 when he signed AMA paper again and left the hospital with his son.   Patient demonstrates capacity to make medical decision but making poor decision.  See individual problem list below for more on hospital course.  Discharge Diagnoses:  Septic shock secondary to severe right lower extremity purulent cellulitis-c -Completed 7 days of antibiotic on 11/7.  Clinically improved.  Has been afebrile.  Hemodynamically stable.  -Leukocytosis again on 11/9.  ID reconsulted.  Started on IV ceftazidime.  Wound culture with Pseudomonas aeruginosa resistant to ceftazidime.  Antibiotic changed to IV meropenem on 11/12.  Leukocytosis resolved.  Plan was to complete antibiotic through 11/16 but he left AGAINST MEDICAL ADVICE.  He is allergic to Cipro.  Wide-complex tachycardia/sinus node dysfunction/paroxysmal A. Fib: CHA2DS2-VASc score 0.  Evaluated by cardiology neurology during this hospitalization.  He has intermittent sinus pauses as long as 6 secs but not symptomatic.  Hemodynamically stable.  He is not on nodal blocking agents. -Discussed with cardiology, Dr. Garen Lah on 11/10-no further intervention.  No indication for pacemaker.  Acute metabolic encephalopathy: Likely due to sepsis and renal failure.  Resolved. -Reorientation and delirium precautions  Hypotension:  BP 120/55 -Continue midodrine.  ESRD on HD MWF Hyperkalemia: K5.9.  Refused Lokelma. -Per nephrology.   S/p renal transplant: On chronic prednisone. -Continue home prednisone after taper  Anemia of renal disease: Stable. -Per nephrology  Chronic pain syndrome -Continue current regimen.  GERD: -Continue PPI  Noncompliance: Improved. -Encouraged and counseled.  Debility/physical deconditioning: -PT/OT recommended CIR.  Leukocytosis:  WBC 9.5>>18.5>> 9.7.  Resolved.  Thrombocytopenia: Stable.  Pressure skin injury: Stage I. Pressure Injury 11/29/19 Heel Right Deep Tissue Pressure Injury - Purple or maroon localized area of discolored intact skin or blood-filled blister due to  damage of underlying soft tissue from pressure and/or shear. (Active)  11/29/19 0945  Location: Heel  Location Orientation: Right  Staging: Deep Tissue Pressure Injury - Purple or maroon localized area of discolored intact skin or blood-filled blister due to damage of underlying soft tissue from pressure and/or shear.  Wound Description (Comments):   Present on Admission: No    Discharge Exam: Vitals:   12/05/19 0750 12/05/19 1206  BP: (!) 101/55 (!) 120/55  Pulse: 69 70  Resp: 16 17  Temp: 98.4 F (36.9 C) 99 F (37.2 C)  SpO2: 100% 98%    GENERAL: No apparent distress.  Nontoxic. HEENT: MMM.  Vision and hearing grossly intact.  NECK: Supple.  No apparent JVD.  RESP:  No IWOB.  Fair aeration bilaterally. CVS: 2/6 SEM over RUSB ABD/GI/GU: Bowel sounds present. Soft. Non tender.  MSK/EXT:  Moves extremities. No apparent deformity.  1+ edema bilaterally SKIN: Clear looking skin ulceration over RLE NEURO: Awake, alert and oriented appropriately.  No apparent focal neuro deficit. PSYCH: Somewhat upset  Discharge Instructions  Discharge Instructions    Diet - low sodium heart healthy   Complete by: As directed    Discharge wound care:   Complete by: As directed    Dressing procedure/placement/frequency: Apply enzymatic debridement ointment to clear non viable tissue, top with small saline moist gauze dressing. Top with dry dressing, secure with kerlix or conform gauze and top with ACE wrap with light tension to secure dressing. Change daily.   Increase activity slowly   Complete by: As directed      Allergies as of 12/05/2019      Reactions   Ciprofloxacin Rash   skin on hands peeled off "in sheets."   Sulfa Antibiotics Rash    Med Rec must be completed prior to using this [facility]        Discharge Care Instructions  (From admission, onward)         Start     Ordered   12/01/19 0000  Discharge wound care:       Comments: Dressing  procedure/placement/frequency: Apply enzymatic debridement ointment to clear non viable tissue, top with small saline moist gauze dressing. Top with dry dressing, secure with kerlix or conform gauze and top with ACE wrap with light tension to secure dressing. Change daily.   12/01/19 1058          Consultations:  Infectious disease  Nephrology  Procedures/Studies:   CT HEAD WO CONTRAST  Result Date: 11/20/2019 CLINICAL DATA:  Neuro deficit, acute. EXAM: CT HEAD WITHOUT CONTRAST TECHNIQUE: Contiguous axial images were obtained from the base of the skull through the vertex without intravenous contrast. COMPARISON:  None. FINDINGS: Brain: No acute infarct or intracranial hemorrhage. No mass lesion. No midline shift, ventriculomegaly or extra-axial fluid collection. Vascular: No hyperdense vessel or unexpected calcification. Bilateral skull base atherosclerotic calcifications. Skull: Negative for fracture or focal lesion. Sinuses/Orbits: Normal orbits. Clear paranasal sinuses. No mastoid effusion. Other: None. IMPRESSION: No acute intracranial process. Electronically Signed   By: Primitivo Gauze M.D.   On: 11/20/2019 20:50   CT Tibia Fibula Right Wo Contrast  Result Date: 11/17/2019 CLINICAL DATA:  Right lower extremity pain and swelling. History of laceration. EXAM: CT OF THE LOWER RIGHT EXTREMITY WITHOUT CONTRAST TECHNIQUE: Multidetector CT imaging of the right lower extremity was performed according to the standard protocol. COMPARISON:  None. FINDINGS: Diffuse and marked subcutaneous soft tissue swelling/edema/fluid suggesting severe cellulitis. I do not see a discrete drainable fluid collection to suggest a soft tissue abscess. No findings suspicious for  myofasciitis or pyomyositis. No knee or ankle joint effusion is identified. Severe vascular disease for age. No CT findings suspicious for septic arthritis or osteomyelitis. IMPRESSION: 1. Diffuse and marked subcutaneous soft tissue  swelling/edema/fluid suggesting severe cellulitis. No discrete drainable abscess. 2. No findings suspicious for myofasciitis or pyomyositis. 3. Severe vascular disease for age. 4. No findings suspicious for osteomyelitis or septic arthritis. Electronically Signed   By: Marijo Sanes M.D.   On: 11/17/2019 15:36   US Venous Img Lower Unilateral Right (DVT)  Result Date: 11/19/2019 CLINICAL DATA:  49 year old male with a history of edema EXAM: RIGHT LOWER EXTREMITY VENOUS DOPPLER ULTRASOUND TECHNIQUE: Gray-scale sonography with graded compression, as well as color Doppler and duplex ultrasound were performed to evaluate the lower extremity deep venous systems from the level of the common femoral vein and including the common femoral, femoral, profunda femoral, popliteal and calf veins including the posterior tibial, peroneal and gastrocnemius veins when visible. The superficial great saphenous vein was also interrogated. Spectral Doppler was utilized to evaluate flow at rest and with distal augmentation maneuvers in the common femoral, femoral and popliteal veins. COMPARISON:  None. FINDINGS: Contralateral Common Femoral Vein: Respiratory phasicity is normal and symmetric with the symptomatic side. No evidence of thrombus. Normal compressibility. Common Femoral Vein: No evidence of thrombus. Normal compressibility, respiratory phasicity and response to augmentation. Saphenofemoral Junction: No evidence of thrombus. Normal compressibility and flow on color Doppler imaging. Profunda Femoral Vein: No evidence of thrombus. Normal compressibility and flow on color Doppler imaging. Femoral Vein: No evidence of thrombus. Normal compressibility, respiratory phasicity and response to augmentation. Popliteal Vein: No evidence of thrombus. Normal compressibility, respiratory phasicity and response to augmentation. Calf Veins: No evidence of thrombus. Normal compressibility and flow on color Doppler imaging. Superficial Great  Saphenous Vein: No evidence of thrombus. Normal compressibility and flow on color Doppler imaging. Other Findings:  Edema IMPRESSION: Sonographic survey of the right lower extremity negative for DVT Edema right lower extremity Electronically Signed   By: Corrie Mckusick D.O.   On: 11/19/2019 10:14   DG Chest Port 1 View  Result Date: 11/29/2019 CLINICAL DATA:  Leukocytosis.  Fever. EXAM: PORTABLE CHEST 1 VIEW COMPARISON:  11/10/2019 FINDINGS: Cardiac enlargement without heart failure or edema. Mild bibasilar airspace disease, with improvement on the right. Small bilateral pleural effusions, with improvement on the right. Calcified lesion in the left lower neck likely thyroid nodule. This is unchanged. This measures approximately 15 x 20 mm. IMPRESSION: Mild bibasilar airspace disease. Improved aeration in the right lung base compared to the prior study. Electronically Signed   By: Franchot Gallo M.D.   On: 11/29/2019 09:03   DG Chest Port 1 View  Result Date: 11/20/2019 CLINICAL DATA:  Shortness of breath EXAM: PORTABLE CHEST 1 VIEW COMPARISON:  November 19, 2019 FINDINGS: Stable cardiomegaly. Mild increased interstitial markings. More focal opacity in the right base. No pneumothorax. No other change. IMPRESSION: 1. Cardiomegaly and mild edema. 2. More focal opacity in the right base could represent developing pneumonia. Recommend attention on follow-up. Electronically Signed   By: Dorise Bullion III M.D   On: 11/20/2019 09:29   DG Chest Port 1 View  Result Date: 11/19/2019 CLINICAL DATA:  Sepsis.  Hypotension.  Follow-up exam. EXAM: PORTABLE CHEST 1 VIEW COMPARISON:  11/17/2019 and older studies. FINDINGS: Stable cardiomegaly.  No mediastinal or hilar masses. Thickened interstitial markings. There is additional hazy opacity at the right lung base with the right hemidiaphragm partly obscured. Suspect a small  pleural effusion. No pneumothorax. IMPRESSION: 1. Stable appearance from the most recent prior  exam. 2. Cardiomegaly with interstitial thickening as well as a small right effusion. Possible mild degree congestive heart failure or fluid overload. No convincing pneumonia. Electronically Signed   By: Lajean Manes M.D.   On: 11/19/2019 06:29   DG Chest Port 1 View  Result Date: 11/17/2019 CLINICAL DATA:  Hypotension. EXAM: PORTABLE CHEST 1 VIEW COMPARISON:  July 01, 2017 FINDINGS: Mild to moderate severity diffuse chronic appearing increased interstitial lung markings are seen. Mild atelectasis is noted within the bilateral lung bases. There is a small, stable right pleural effusion. No pneumothorax is identified. The cardiac silhouette is markedly enlarged and unchanged in size. The visualized skeletal structures are unremarkable. IMPRESSION: 1. Diffuse chronic appearing increased interstitial lung markings. A small superimposed component of interstitial edema cannot be excluded. 2. Mild bibasilar atelectasis. 3. Small right pleural effusion. Electronically Signed   By: Virgina Norfolk M.D.   On: 11/17/2019 20:41   ECHOCARDIOGRAM COMPLETE  Result Date: 11/19/2019    ECHOCARDIOGRAM REPORT   Patient Name:   ARCHIBALD MARCHETTA Date of Exam: 11/19/2019 Medical Rec #:  [id number]       Height:       74.0 in Accession #:    [id number]      Weight:       213.6 lb Date of Birth:  01-31-1970        BSA:          2.235 m Patient Age:    49 years        BP:           121/57 mmHg Patient Gender: M               HR:           61 bpm. Exam Location:  [facility] Procedure: 2D Echo Indications:     Cardiomyopathy 425.9/ I42.9  History:         Patient has prior history of Echocardiogram examinations, most                  recent 02/07/2019.  Sonographer:     Arville Go RDCS Referring Phys:  [id number] [place] Diagnosing Phys: Yolonda Kida MD  Sonographer Comments: Image acquisition challenging due to patient behavioral factors. IMPRESSIONS  1. Left ventricular ejection fraction, by estimation, is 65 to  70%. Left ventricular ejection fraction by PLAX is 64 %. The left ventricle has normal function. The left ventricle has no regional wall motion abnormalities. Left ventricular diastolic function could not be evaluated. There is the interventricular septum is flattened in systole and diastole, consistent with right ventricular pressure and volume overload.  2. Right ventricular systolic function is normal. The right ventricular size is normal.  3. The mitral valve is grossly normal. No evidence of mitral valve regurgitation.  4. The aortic valve is normal in structure. Aortic valve regurgitation is not visualized. FINDINGS  Left Ventricle: Left ventricular ejection fraction, by estimation, is 65 to 70%. Left ventricular ejection fraction by PLAX is 64 %. The left ventricle has normal function. The left ventricle has no regional wall motion abnormalities. The left ventricular internal cavity size was normal in size. There is no left ventricular hypertrophy. The interventricular septum is flattened in systole and diastole, consistent with right ventricular pressure and volume overload. Left ventricular diastolic function could not be evaluated. Right Ventricle: The right ventricular size is normal. No increase  in right ventricular wall thickness. Right ventricular systolic function is normal. Left Atrium: Left atrial size was normal in size. Right Atrium: Right atrial size was normal in size. Pericardium: There is no evidence of pericardial effusion. Mitral Valve: The mitral valve is grossly normal. No evidence of mitral valve regurgitation. Tricuspid Valve: The tricuspid valve is normal in structure. Tricuspid valve regurgitation is trivial. Aortic Valve: The aortic valve is normal in structure. Aortic valve regurgitation is not visualized. Aortic regurgitation PHT measures 714 msec. Aortic valve mean gradient measures 16.0 mmHg. Aortic valve peak gradient measures 25.5 mmHg. Pulmonic Valve: The pulmonic valve was  normal in structure. Pulmonic valve regurgitation is trivial. Aorta: The ascending aorta was not well visualized. IAS/Shunts: No atrial level shunt detected by color flow Doppler.  LEFT VENTRICLE PLAX 2D LV EF:         Left ventricular ejection fraction by PLAX is 64 %. LVIDd:         5.45 cm LVIDs:         3.53 cm LV PW:         1.55 cm LV IVS:        1.33 cm LVOT diam:     2.10 cm LVOT Area:     3.46 cm  LEFT ATRIUM         Index LA diam:    5.10 cm 2.28 cm/m  AORTIC VALVE AV Vmax:      252.50 cm/s AV Vmean:     195.000 cm/s AV VTI:       0.413 m AV Peak Grad: 25.5 mmHg    PULMONARY ARTERY AV Mean Grad: 16.0 mmHg    MPA diam:        3.30 cm AI PHT:       714 msec  AORTA Ao Root diam: 4.50 cm Ao Asc diam:  3.60 cm TRICUSPID VALVE TV Peak grad:   35.3 mmHg TV Vmax:        2.97 m/s  SHUNTS Systemic Diam: 2.10 cm Dwayne Prince Rome MD Electronically signed by Yolonda Kida MD Signature Date/Time: 11/19/2019/3:23:25 PM    Final        The results of significant diagnostics from this hospitalization (including imaging, microbiology, ancillary and laboratory) are listed below for reference.     Microbiology: Recent Results (from the past 240 hour(s))  Resp Panel by RT PCR (RSV, Flu A&B, Covid) - Nasopharyngeal Swab     Status: None   Collection Time: 11/29/19  2:13 PM   Specimen: Nasopharyngeal Swab  Result Value Ref Range Status   SARS Coronavirus 2 by RT PCR NEGATIVE NEGATIVE Final    Comment: (NOTE) SARS-CoV-2 target nucleic acids are NOT DETECTED.  The SARS-CoV-2 RNA is generally detectable in upper respiratoy specimens during the acute phase of infection. The lowest concentration of SARS-CoV-2 viral copies this assay can detect is 131 copies/mL. A negative result does not preclude SARS-Cov-2 infection and should not be used as the sole basis for treatment or other patient management decisions. A negative result may occur with  improper specimen collection/handling, submission of  specimen other than nasopharyngeal swab, presence of viral mutation(s) within the areas targeted by this assay, and inadequate number of viral copies (<131 copies/mL). A negative result must be combined with clinical observations, patient history, and epidemiological information. The expected result is Negative.  Fact Sheet for Patients:  PinkCheek.be  Fact Sheet for Healthcare Providers:  GravelBags.it  This test is no t yet approved or  cleared by the [place] and  has been authorized for detection and/or diagnosis of SARS-CoV-2 by FDA under an Emergency Use Authorization (EUA). This EUA will remain  in effect (meaning this test can be used) for the duration of the COVID-19 declaration under Section 564(b)(1) of the Act, 21 U.S.C. section 360bbb-3(b)(1), unless the authorization is terminated or revoked sooner.     Influenza A by PCR NEGATIVE NEGATIVE Final   Influenza B by PCR NEGATIVE NEGATIVE Final    Comment: (NOTE) The Xpert Xpress SARS-CoV-2/FLU/RSV assay is intended as an aid in  the diagnosis of influenza from Nasopharyngeal swab specimens and  should not be used as a sole basis for treatment. Nasal washings and  aspirates are unacceptable for Xpert Xpress SARS-CoV-2/FLU/RSV  testing.  Fact Sheet for Patients: PinkCheek.be  Fact Sheet for Healthcare Providers: GravelBags.it  This test is not yet approved or cleared by the [place] FDA and  has been authorized for detection and/or diagnosis of SARS-CoV-2 by  FDA under an Emergency Use Authorization (EUA). This EUA will remain  in effect (meaning this test can be used) for the duration of the  Covid-19 declaration under Section 564(b)(1) of the Act, 21  U.S.C. section 360bbb-3(b)(1), unless the authorization is  terminated or revoked.    Respiratory Syncytial Virus by PCR NEGATIVE NEGATIVE  Final    Comment: (NOTE) Fact Sheet for Patients: PinkCheek.be  Fact Sheet for Healthcare Providers: GravelBags.it  This test is not yet approved or cleared by the [place] FDA and  has been authorized for detection and/or diagnosis of SARS-CoV-2 by  FDA under an Emergency Use Authorization (EUA). This EUA will remain  in effect (meaning this test can be used) for the duration of the  COVID-19 declaration under Section 564(b)(1) of the Act, 21 U.S.C.  section 360bbb-3(b)(1), unless the authorization is terminated or  revoked. Performed at [facility], [address], [place], [place] [address]   Aerobic Culture (superficial specimen)     Status: None   Collection Time: 11/29/19  5:23 PM   Specimen: Wound  Result Value Ref Range Status   Specimen Description   Final    WOUND Performed at [facility], [address] [place], [place] [address]    Special Requests   Final    RIGHT LEG Performed at [facility], [address] [place], [place] [address]    Gram Stain   Final    FEW WBC PRESENT, PREDOMINANTLY PMN RARE GRAM POSITIVE COCCI Performed at [facility], [place] [address] [place], [place] [address]    Culture RARE PSEUDOMONAS AERUGINOSA  Final   Report Status 12/02/2019 FINAL  Final   Organism ID, Bacteria PSEUDOMONAS AERUGINOSA  Final      Susceptibility   Pseudomonas aeruginosa - MIC*    CEFTAZIDIME >=64 RESISTANT Resistant     CIPROFLOXACIN <=0.25 SENSITIVE Sensitive     GENTAMICIN <=1 SENSITIVE Sensitive     IMIPENEM 2 SENSITIVE Sensitive     CEFEPIME 16 RESISTANT Resistant     * RARE PSEUDOMONAS AERUGINOSA     Labs: BNP (last 3 results) No results for input(s): BNP in the last 8760 hours. Basic Metabolic Panel: Recent Labs  Lab 12/01/19 1100 12/02/19 0727 12/03/19 0723 12/04/19 0453 12/05/19 0432  NA 139 137 135 133* 139  K 4.4 4.6 5.1 4.8 5.9*  CL  99 97* 96* 95* 99  CO2 25 29 29 28 29   GLUCOSE 75 80  66* 103* 79  BUN 42* 33* 44* 34* 48*  CREATININE 5.06* 4.69* 5.55* 4.37* 5.36*  CALCIUM 9.1 9.1 8.9 8.6* 9.2  MG 1.8 1.9 1.9 1.8 2.0  PHOS 4.4 4.3 5.1* 4.7* 5.8*   Liver Function Tests: Recent Labs  Lab 12/01/19 1100 12/02/19 0727 12/03/19 0723 12/04/19 0453 12/05/19 0432  ALBUMIN 2.7* 2.6* 2.5* 2.5* 2.6*   No results for input(s): LIPASE, AMYLASE in the last 168 hours. No results for input(s): AMMONIA in the last 168 hours. CBC: Recent Labs  Lab 12/01/19 1100 12/02/19 0727 12/03/19 0723 12/04/19 0453 12/05/19 0432  WBC 21.8* 15.7* 12.5* 10.8* 9.7  NEUTROABS  --   --  10.0* 8.8* 7.8*  HGB 9.7* 9.0* 8.8* 8.6* 8.6*  HCT 31.3* 29.4* 28.0* 27.8* 28.8*  MCV 96.0 98.3 96.2 97.9 98.0  PLT 133* 108* 101* 104* 110*   Cardiac Enzymes: No results for input(s): CKTOTAL, CKMB, CKMBINDEX, TROPONINI in the last 168 hours. BNP: Invalid input(s): POCBNP CBG: No results for input(s): GLUCAP in the last 168 hours. D-Dimer No results for input(s): DDIMER in the last 72 hours. Hgb A1c No results for input(s): HGBA1C in the last 72 hours. Lipid Profile No results for input(s): CHOL, HDL, LDLCALC, TRIG, CHOLHDL, LDLDIRECT in the last 72 hours. Thyroid function studies No results for input(s): TSH, T4TOTAL, T3FREE, THYROIDAB in the last 72 hours.  Invalid input(s): FREET3 Anemia work up No results for input(s): VITAMINB12, FOLATE, FERRITIN, TIBC, IRON, RETICCTPCT in the last 72 hours. Urinalysis No results found for: COLORURINE, APPEARANCEUR, LABSPEC, PHURINE, GLUCOSEU, HGBUR, BILIRUBINUR, KETONESUR, PROTEINUR, UROBILINOGEN, NITRITE, LEUKOCYTESUR Sepsis Labs Invalid input(s): PROCALCITONIN,  WBC,  LACTICIDVEN   Time coordinating discharge: 35 minutes  SIGNED:  Mercy Riding, MD  [facility] 12/05/2019, 3:50 PM  If 7PM-7AM, please contact night-coverage [email or web address]

## 2019-12-05 NOTE — TOC Progression Note (Signed)
Transition of Care [facility]) - Progression Note    Patient Details  Name: [name] MRN: [id number] Date of Birth: [birth year]  Transition of Care [facility]) CM/SW Reader, RN Phone Number: 12/05/2019, 10:18 AM  Clinical Narrative:  8:30am- RNCM sent updated clinicals to [facility] inpatient rehab via fax at [phone].     10am- RNCM received return call from [facility] with [facility] inpatient. Pam reports that they did receive updated clinicals today but due to patient's Potassium being elevated they will not be able to accept him today. She reports that if Potassium is treated and his dialysis can be done early tomorrow they can likely take him tomorrow afternoon. She does also report that she will get in touch with the [facility] to update the for his authorization.     Expected Discharge Plan: [place] Barriers to Discharge: Continued Medical Work up  Expected Discharge Plan and Services Expected Discharge Plan: [place]   Discharge Planning Services: CM Consult Post Acute Care Choice: [place], [place] Living arrangements for the past 2 months: Single Family Home Expected Discharge Date: 12/01/19                                     Social Determinants of Health (SDOH) Interventions    Readmission Risk Interventions Readmission Risk Prevention Plan 11/24/2019  Transportation Screening Complete  PCP or Specialist Appt within 3-5 Days Complete  HRI or [place] Complete  Social Work Consult for [place] Planning/Counseling Complete  Palliative Care Screening Not Applicable  Medication Review Press photographer) Referral to Pharmacy  Some recent data might be hidden

## 2019-12-05 NOTE — Progress Notes (Signed)
Pharmacy Antibiotic Note  [name] is a 49 y.o. male admitted on 12/05/2019 with Wound infection with Pseudomonas.  Pharmacy has been consulted for Meropenem dosing. CrCl = 17.3 ml/min , on HD MWF  Plan: Meropenem 500 mg IV Q24H   Height: 6\' 2"  (188 cm) Weight: 95.3 kg (210 lb) IBW/kg (Calculated) : 82.2  Temp (24hrs), Avg:98.1 F (36.7 C), Min:97.5 F (36.4 C), Max:99 F (37.2 C)  Recent Labs  Lab 12/02/19 0727 12/03/19 0723 12/04/19 0453 12/05/19 0432 12/05/19 1827  WBC 15.7* 12.5* 10.8* 9.7 11.5*  CREATININE 4.69* 5.55* 4.37* 5.36* 5.95*    Estimated Creatinine Clearance: 17.3 mL/min (A) (by C-G formula based on SCr of 5.95 mg/dL (H)).    Allergies  Allergen Reactions  . Ciprofloxacin Rash    skin on hands peeled off "in sheets."  . Sulfa Antibiotics Rash    Antimicrobials this admission:   >>    >>   Dose adjustments this admission:   Microbiology results:  BCx:   UCx:    Sputum:    MRSA PCR:   Thank you for allowing pharmacy to be a part of this patient's care.  Fronnie Urton D 12/05/2019 11:12 PM

## 2019-12-06 DIAGNOSIS — R5381 Other malaise: Secondary | ICD-10-CM

## 2019-12-06 DIAGNOSIS — N2581 Secondary hyperparathyroidism of renal origin: Secondary | ICD-10-CM | POA: Diagnosis present

## 2019-12-06 DIAGNOSIS — N186 End stage renal disease: Secondary | ICD-10-CM

## 2019-12-06 DIAGNOSIS — Z87891 Personal history of nicotine dependence: Secondary | ICD-10-CM | POA: Diagnosis not present

## 2019-12-06 DIAGNOSIS — L03115 Cellulitis of right lower limb: Secondary | ICD-10-CM | POA: Diagnosis present

## 2019-12-06 DIAGNOSIS — Z9119 Patient's noncompliance with other medical treatment and regimen: Secondary | ICD-10-CM

## 2019-12-06 DIAGNOSIS — Z5329 Procedure and treatment not carried out because of patient's decision for other reasons: Secondary | ICD-10-CM | POA: Diagnosis present

## 2019-12-06 DIAGNOSIS — G894 Chronic pain syndrome: Secondary | ICD-10-CM

## 2019-12-06 DIAGNOSIS — T8611 Kidney transplant rejection: Secondary | ICD-10-CM

## 2019-12-06 DIAGNOSIS — B965 Pseudomonas (aeruginosa) (mallei) (pseudomallei) as the cause of diseases classified elsewhere: Secondary | ICD-10-CM | POA: Diagnosis present

## 2019-12-06 DIAGNOSIS — T8612 Kidney transplant failure: Secondary | ICD-10-CM

## 2019-12-06 DIAGNOSIS — Z8249 Family history of ischemic heart disease and other diseases of the circulatory system: Secondary | ICD-10-CM | POA: Diagnosis not present

## 2019-12-06 DIAGNOSIS — R3915 Urgency of urination: Secondary | ICD-10-CM | POA: Diagnosis present

## 2019-12-06 DIAGNOSIS — I480 Paroxysmal atrial fibrillation: Secondary | ICD-10-CM | POA: Diagnosis present

## 2019-12-06 DIAGNOSIS — Z20822 Contact with and (suspected) exposure to covid-19: Secondary | ICD-10-CM | POA: Diagnosis present

## 2019-12-06 DIAGNOSIS — Z992 Dependence on renal dialysis: Secondary | ICD-10-CM | POA: Diagnosis not present

## 2019-12-06 DIAGNOSIS — M329 Systemic lupus erythematosus, unspecified: Secondary | ICD-10-CM | POA: Diagnosis present

## 2019-12-06 DIAGNOSIS — Z7982 Long term (current) use of aspirin: Secondary | ICD-10-CM | POA: Diagnosis not present

## 2019-12-06 DIAGNOSIS — E875 Hyperkalemia: Secondary | ICD-10-CM | POA: Diagnosis present

## 2019-12-06 DIAGNOSIS — Z79899 Other long term (current) drug therapy: Secondary | ICD-10-CM | POA: Diagnosis not present

## 2019-12-06 DIAGNOSIS — Z808 Family history of malignant neoplasm of other organs or systems: Secondary | ICD-10-CM | POA: Diagnosis not present

## 2019-12-06 DIAGNOSIS — D696 Thrombocytopenia, unspecified: Secondary | ICD-10-CM | POA: Diagnosis present

## 2019-12-06 DIAGNOSIS — A498 Other bacterial infections of unspecified site: Secondary | ICD-10-CM

## 2019-12-06 DIAGNOSIS — L0889 Other specified local infections of the skin and subcutaneous tissue: Secondary | ICD-10-CM | POA: Diagnosis not present

## 2019-12-06 DIAGNOSIS — K219 Gastro-esophageal reflux disease without esophagitis: Secondary | ICD-10-CM | POA: Diagnosis present

## 2019-12-06 DIAGNOSIS — Y830 Surgical operation with transplant of whole organ as the cause of abnormal reaction of the patient, or of later complication, without mention of misadventure at the time of the procedure: Secondary | ICD-10-CM | POA: Diagnosis present

## 2019-12-06 DIAGNOSIS — D631 Anemia in chronic kidney disease: Secondary | ICD-10-CM | POA: Diagnosis present

## 2019-12-06 DIAGNOSIS — Z7952 Long term (current) use of systemic steroids: Secondary | ICD-10-CM | POA: Diagnosis not present

## 2019-12-06 DIAGNOSIS — Z905 Acquired absence of kidney: Secondary | ICD-10-CM | POA: Diagnosis not present

## 2019-12-06 DIAGNOSIS — Z882 Allergy status to sulfonamides status: Secondary | ICD-10-CM | POA: Diagnosis not present

## 2019-12-06 LAB — BASIC METABOLIC PANEL
Anion gap: 12 (ref 5–15)
Anion gap: 13 (ref 5–15)
BUN: 55 mg/dL — ABNORMAL HIGH (ref 6–20)
BUN: 58 mg/dL — ABNORMAL HIGH (ref 6–20)
CO2: 27 mmol/L (ref 22–32)
CO2: 29 mmol/L (ref 22–32)
Calcium: 9 mg/dL (ref 8.9–10.3)
Calcium: 9.3 mg/dL (ref 8.9–10.3)
Chloride: 96 mmol/L — ABNORMAL LOW (ref 98–111)
Chloride: 97 mmol/L — ABNORMAL LOW (ref 98–111)
Creatinine, Ser: 6.23 mg/dL — ABNORMAL HIGH (ref 0.61–1.24)
Creatinine, Ser: 6.5 mg/dL — ABNORMAL HIGH (ref 0.61–1.24)
GFR, Estimated: 10 mL/min — ABNORMAL LOW (ref >60)
GFR, Estimated: 10 mL/min — ABNORMAL LOW (ref >60)
Glucose, Bld: 112 mg/dL — ABNORMAL HIGH (ref 70–99)
Glucose, Bld: 91 mg/dL (ref 70–99)
Potassium: 6 mmol/L — ABNORMAL HIGH (ref 3.5–5.1)
Potassium: 6.5 mmol/L (ref 3.5–5.1)
Sodium: 135 mmol/L (ref 135–145)
Sodium: 139 mmol/L (ref 135–145)

## 2019-12-06 LAB — CBC
HCT: 29 % — ABNORMAL LOW (ref 39.0–52.0)
Hemoglobin: 9 g/dL — ABNORMAL LOW (ref 13.0–17.0)
MCH: 30 pg (ref 26.0–34.0)
MCHC: 31 g/dL (ref 30.0–36.0)
MCV: 96.7 fL (ref 80.0–100.0)
Platelets: 92 10*3/uL — ABNORMAL LOW (ref 150–400)
RBC: 3 MIL/uL — ABNORMAL LOW (ref 4.22–5.81)
RDW: 18.6 % — ABNORMAL HIGH (ref 11.5–15.5)
WBC: 10.7 10*3/uL — ABNORMAL HIGH (ref 4.0–10.5)
nRBC: 0 % (ref 0.0–0.2)

## 2019-12-06 MED ORDER — PROMETHAZINE HCL 25 MG PO TABS
25.0000 mg | ORAL_TABLET | Freq: Four times a day (QID) | ORAL | Status: DC | PRN
  Administered 2019-12-11 – 2019-12-23 (×10): 25 mg via ORAL
  Filled 2019-12-06 (×13): qty 1

## 2019-12-06 MED ORDER — RENA-VITE PO TABS
1.0000 | ORAL_TABLET | Freq: Every day | ORAL | Status: DC
  Administered 2019-12-06: 1 via ORAL
  Filled 2019-12-06 (×16): qty 1

## 2019-12-06 MED ORDER — PANTOPRAZOLE SODIUM 40 MG PO TBEC
40.0000 mg | DELAYED_RELEASE_TABLET | Freq: Two times a day (BID) | ORAL | Status: DC
  Administered 2019-12-06 – 2019-12-07 (×3): 40 mg via ORAL
  Filled 2019-12-06 (×15): qty 1

## 2019-12-06 MED ORDER — NEPRO/CARBSTEADY PO LIQD
237.0000 mL | Freq: Two times a day (BID) | ORAL | Status: DC
  Administered 2019-12-07 – 2019-12-10 (×3): 237 mL via ORAL

## 2019-12-06 MED ORDER — OXYCODONE HCL 5 MG PO TABS
5.0000 mg | ORAL_TABLET | Freq: Four times a day (QID) | ORAL | Status: DC | PRN
  Administered 2019-12-06: 10 mg via ORAL
  Administered 2019-12-06: 5 mg via ORAL
  Administered 2019-12-07 – 2019-12-14 (×24): 10 mg via ORAL
  Administered 2019-12-14: 5 mg via ORAL
  Administered 2019-12-14 – 2019-12-23 (×29): 10 mg via ORAL
  Filled 2019-12-06 (×39): qty 2
  Filled 2019-12-06 (×3): qty 1
  Filled 2019-12-06 (×2): qty 2
  Filled 2019-12-06: qty 1
  Filled 2019-12-06 (×12): qty 2
  Filled 2019-12-06: qty 1

## 2019-12-06 MED ORDER — FERROUS SULFATE 325 (65 FE) MG PO TABS
325.0000 mg | ORAL_TABLET | Freq: Two times a day (BID) | ORAL | Status: DC
  Administered 2019-12-06 – 2019-12-23 (×12): 325 mg via ORAL
  Filled 2019-12-06 (×20): qty 1

## 2019-12-06 MED ORDER — LOPERAMIDE HCL 2 MG PO CAPS
2.0000 mg | ORAL_CAPSULE | Freq: Four times a day (QID) | ORAL | Status: DC | PRN
  Administered 2019-12-06 – 2019-12-23 (×49): 2 mg via ORAL
  Filled 2019-12-06 (×51): qty 1

## 2019-12-06 MED ORDER — DEXTROSE 50 % IV SOLN
50.0000 mL | Freq: Once | INTRAVENOUS | Status: AC
  Administered 2019-12-06: 50 mL via INTRAVENOUS
  Filled 2019-12-06: qty 50

## 2019-12-06 MED ORDER — TRAZODONE HCL 50 MG PO TABS
50.0000 mg | ORAL_TABLET | Freq: Every day | ORAL | Status: DC
  Administered 2019-12-06 – 2019-12-22 (×17): 50 mg via ORAL
  Filled 2019-12-06 (×17): qty 1

## 2019-12-06 MED ORDER — INSULIN ASPART 100 UNIT/ML IV SOLN
10.0000 [IU] | Freq: Once | INTRAVENOUS | Status: AC
  Administered 2019-12-06: 10 [IU] via INTRAVENOUS
  Filled 2019-12-06: qty 0.1

## 2019-12-06 MED ORDER — MIDODRINE HCL 5 MG PO TABS
10.0000 mg | ORAL_TABLET | Freq: Three times a day (TID) | ORAL | Status: DC
  Administered 2019-12-06 – 2019-12-15 (×24): 10 mg via ORAL
  Filled 2019-12-06 (×46): qty 2

## 2019-12-06 MED ORDER — SODIUM CHLORIDE 0.9 % IV SOLN
500.0000 mg | INTRAVENOUS | Status: AC
  Administered 2019-12-06 – 2019-12-08 (×3): 500 mg via INTRAVENOUS
  Filled 2019-12-06 (×3): qty 500

## 2019-12-06 NOTE — Progress Notes (Signed)
PROGRESS NOTE  [name] STM:[id number] DOB: Aug 10, 1970   PCP: [facility]  Patient is from: Home.  DOA: 12/05/2019 LOS: 0  Chief complaints: Right lower extremity pain and swelling with purulent discharge  Brief Narrative / Interim history: 49 year old male with history of ESRD on HD TTS, previous kidney transplant on prednisone chronically, anemia of renal disease, diverticulosis and GERD admitted with right lower extremity cellulitis after he presented with right lower extremity pain, swelling with purulent discharge and associated fever and rigors.  In ED, became hypotensive requiring pressors and ICU level of care for septic shock secondary to right lower extremity cellulitis.  Eventually stabilized and transferred to [facility] service on 11/23/2019.  He received different IV antibiotics from 10/28-11/7 which includes IV cefepime from 10/28-11/2.  Patient had leukocytosis after initial improvement.  Infectious disease reconsulted.  Superficial wound culture grew Pseudomonas.  Started on ceftazidime on 12/01/2019.  However, Pseudomonas resistant to ceftazidime.  Started on meropenem on 12/02/2019.  Leukocytosis resolved.  Patient left AGAINST MEDICAL ADVICE on 12/05/2019 but returned to ED the same evening as his outpatient dialysis center refused to dialyze him without COVID-19 test..  He was hemodynamically stable.  CBC at baseline.  K6.5.  Nephrology and ID reconsulted.  Restarted on IV meropenem.    Subjective: Patient returned after he left [place].  No major events overnight or this morning.  No complaints other than the usual pain in his leg.   Objective: Vitals:   12/06/19 0600 12/06/19 0615 12/06/19 0630 12/06/19 0733  BP:    (!) 92/57  Pulse: 68 60 64 68  Resp: 12 12 14 12   Temp:    97.8 F (36.6 C)  TempSrc:    Oral  SpO2: 100% 100% 100% 100%  Weight:      Height:       No intake or output data in the 24 hours ending 12/06/19 1019 Filed  Weights   12/05/19 1757  Weight: 95.3 kg    Examination:  GENERAL: No apparent distress.  Nontoxic. HEENT: MMM.  Vision and hearing grossly intact.  NECK: Supple.  No apparent JVD.  RESP:  No IWOB.  Fair aeration bilaterally. CVS: RRR.  2/6 SEM over RUSB. ABD/GI/GU: BS+. Abd soft, NTND.  MSK/EXT:  Moves extremities but weak.  1+ pitting edema bilaterally. SKIN: Clean looking RLE wound as below.  No drainage or signs of fluid loculation. NEURO: Awake, alert and oriented appropriately.  No apparent focal neuro deficit. PSYCH: Calm.  No distress or agitation.       Procedures:  None  Microbiology summarized: 10/28-Limited RVP nonreactive. 10/29-MRSA PCR positive. 10/28-superficial wound culture with multiple species 10/28-blood cultures NGTD. 11/9-superficial wound culture with Pseudomonas resistant to ceftazidime and cefepime  Assessment & Plan: Right lower extremity cellulitis/wound infection: Recently treated for septic shock due to this.  CT did not reveal abscess or osseous involvement.  Started on IV meropenem before he left AMA on 12/05/2019. Delay in wound healing due to ESRD and lower extremity edema. -Resume IV meropenem on 11/12 through 11/20 per ID recommendations. -Wound care  Leukocytosis:  WBC 9.5>>18.5>>22>> 9.7> 10.7.  Likely due to the above.  Improving. -Antibiotics as above.  Wide-complex tachycardia/sinus node dysfunction/paroxysmal A. Fib: CHA2DS2-VASc score 0.  Evaluated by cardiology.  Previously discussed with cardiology twice. -No further intervention nor  indication for pacemaker. -Avoid nodal blocking agents. -Optimize electrolytes.  Hypotension: Soft blood pressures with diastolic in 89Q -Continue midodrine 10 mg 3 times daily  ESRD on HD MWF-left AMA before HD yesterday. Hyperkalemia: K6.5. -Plan for HD today  S/p renal transplant: On chronic prednisone. -Continue home prednisone 10 mg daily  Anemia of renal disease: Stable. -Per  nephrology  Chronic pain syndrome -Continue as needed oxycodone and Tylenol  Noncompliance: Left AMA on 11/15 but returned to the hospital the same evening. -Encouraged and counseled.  Debility/physical deconditioning: -PT OT consulted.  Previously recommended inpatient rehab and he was accepted to [facility] inpatient rehab before he left AMA. -Consult TOC  Body mass index is 26.96 kg/m.        Pressure skin injury: Stage I. Pressure Injury 11/29/19 Heel Right Deep Tissue Pressure Injury - Purple or maroon localized area of discolored intact skin or blood-filled blister due to damage of underlying soft tissue from pressure and/or shear. (Active)  11/29/19 0945  Location: Heel  Location Orientation: Right  Staging: Deep Tissue Pressure Injury - Purple or maroon localized area of discolored intact skin or blood-filled blister due to damage of underlying soft tissue from pressure and/or shear.  Wound Description (Comments):   Present on Admission: No   DVT prophylaxis:  heparin injection 5,000 Units Start: 12/05/19 2245  Code Status: Full code. Family Communication: Patient and/or RN.  Status is: Observation  The patient will require care spanning > 2 midnights and should be moved to inpatient because: Unsafe d/c plan, IV treatments appropriate due to intensity of illness or inability to take PO and Inpatient level of care appropriate due to severity of illness  Dispo: The patient is from: Home              Anticipated d/c is to: Inpatient rehab?              Anticipated d/c date is: 1 day              Patient currently is not medically stable to d/c.           Consultants:  Nephrology Infectious disease   Sch Meds:  Scheduled Meds: . aspirin EC  81 mg Oral Daily  . heparin  5,000 Units Subcutaneous Q8H  . patiromer  16.8 g Oral Daily  . predniSONE  10 mg Oral Daily   Continuous Infusions: . meropenem (MERREM) IV Stopped (12/06/19 0140)   PRN Meds:.acetaminophen  **OR** acetaminophen, loperamide, ondansetron **OR** ondansetron (ZOFRAN) IV, oxyCODONE  Antimicrobials: Anti-infectives (From admission, onward)   Start     Dose/Rate Route Frequency Ordered Stop   12/05/19 2330  meropenem (MERREM) 500 mg in sodium chloride 0.9 % 100 mL IVPB        500 mg 200 mL/hr over 30 Minutes Intravenous Every 24 hours 12/05/19 2315     12/05/19 2315  meropenem (MERREM) 500 mg in sodium chloride 0.9 % 100 mL IVPB  Status:  Discontinued        500 mg 200 mL/hr over 30 Minutes Intravenous Every 12 hours 12/05/19 2312 12/05/19 2315   12/05/19 2215  meropenem (MERREM) 500 mg in sodium chloride 0.9 % 100 mL IVPB  Status:  Discontinued        500 mg 200 mL/hr over 30 Minutes Intravenous  Once 12/05/19 2202 12/05/19 2203       I have personally reviewed the following labs and images: CBC: Recent Labs  Lab 12/03/19 0723 12/04/19 0453 12/05/19 0432 12/05/19 1827 12/05/19 2335  WBC 12.5* 10.8* 9.7 11.5* 10.7*  NEUTROABS 10.0* 8.8* 7.8* 10.2*  --   HGB 8.8* 8.6* 8.6* 9.2*  9.0*  HCT 28.0* 27.8* 28.8* 28.4* 29.0*  MCV 96.2 97.9 98.0 93.7 96.7  PLT 101* 104* 110* 106* 92*   BMP &GFR Recent Labs  Lab 12/01/19 1100 12/01/19 1100 12/02/19 0727 12/02/19 0727 12/03/19 0723 12/04/19 0453 12/05/19 0432 12/05/19 1827 12/05/19 2335  NA 139   < > 137   < > 135 133* 139 136 139  K 4.4   < > 4.6   < > 5.1 4.8 5.9* 6.5* 6.5*  CL 99   < > 97*   < > 96* 95* 99 98 97*  CO2 25   < > 29   < > 29 28 29 24 29   GLUCOSE 75   < > 80   < > 66* 103* 79 113* 91  BUN 42*   < > 33*   < > 44* 34* 48* 54* 55*  CREATININE 5.06*   < > 4.69*   < > 5.55* 4.37* 5.36* 5.95* 6.23*  CALCIUM 9.1   < > 9.1   < > 8.9 8.6* 9.2 8.6* 9.3  MG 1.8  --  1.9  --  1.9 1.8 2.0  --   --   PHOS 4.4  --  4.3  --  5.1* 4.7* 5.8*  --   --    < > = values in this interval not displayed.   Estimated Creatinine Clearance: 16.5 mL/min (A) (by C-G formula based on SCr of 6.23 mg/dL (H)). Liver &  Pancreas: Recent Labs  Lab 12/01/19 1100 12/02/19 0727 12/03/19 0723 12/04/19 0453 12/05/19 0432  ALBUMIN 2.7* 2.6* 2.5* 2.5* 2.6*   No results for input(s): LIPASE, AMYLASE in the last 168 hours. No results for input(s): AMMONIA in the last 168 hours. Diabetic: No results for input(s): HGBA1C in the last 72 hours. No results for input(s): GLUCAP in the last 168 hours. Cardiac Enzymes: No results for input(s): CKTOTAL, CKMB, CKMBINDEX, TROPONINI in the last 168 hours. No results for input(s): PROBNP in the last 8760 hours. Coagulation Profile: No results for input(s): INR, PROTIME in the last 168 hours. Thyroid Function Tests: No results for input(s): TSH, T4TOTAL, FREET4, T3FREE, THYROIDAB in the last 72 hours. Lipid Profile: No results for input(s): CHOL, HDL, LDLCALC, TRIG, CHOLHDL, LDLDIRECT in the last 72 hours. Anemia Panel: No results for input(s): VITAMINB12, FOLATE, FERRITIN, TIBC, IRON, RETICCTPCT in the last 72 hours. Urine analysis: No results found for: COLORURINE, APPEARANCEUR, LABSPEC, PHURINE, GLUCOSEU, HGBUR, BILIRUBINUR, KETONESUR, PROTEINUR, UROBILINOGEN, NITRITE, LEUKOCYTESUR Sepsis Labs: Invalid input(s): PROCALCITONIN, [place]  Microbiology: Recent Results (from the past 240 hour(s))  Resp Panel by RT PCR (RSV, Flu A&B, Covid) - Nasopharyngeal Swab     Status: None   Collection Time: 11/29/19  2:13 PM   Specimen: Nasopharyngeal Swab  Result Value Ref Range Status   SARS Coronavirus 2 by RT PCR NEGATIVE NEGATIVE Final    Comment: (NOTE) SARS-CoV-2 target nucleic acids are NOT DETECTED.  The SARS-CoV-2 RNA is generally detectable in upper respiratoy specimens during the acute phase of infection. The lowest concentration of SARS-CoV-2 viral copies this assay can detect is 131 copies/mL. A negative result does not preclude SARS-Cov-2 infection and should not be used as the sole basis for treatment or other patient management decisions. A negative  result may occur with  improper specimen collection/handling, submission of specimen other than nasopharyngeal swab, presence of viral mutation(s) within the areas targeted by this assay, and inadequate number of viral copies (<131 copies/mL). A negative result must be  combined with clinical observations, patient history, and epidemiological information. The expected result is Negative.  Fact Sheet for Patients:  PinkCheek.be  Fact Sheet for Healthcare Providers:  GravelBags.it  This test is no t yet approved or cleared by the [place] FDA and  has been authorized for detection and/or diagnosis of SARS-CoV-2 by FDA under an Emergency Use Authorization (EUA). This EUA will remain  in effect (meaning this test can be used) for the duration of the COVID-19 declaration under Section 564(b)(1) of the Act, 21 U.S.C. section 360bbb-3(b)(1), unless the authorization is terminated or revoked sooner.     Influenza A by PCR NEGATIVE NEGATIVE Final   Influenza B by PCR NEGATIVE NEGATIVE Final    Comment: (NOTE) The Xpert Xpress SARS-CoV-2/FLU/RSV assay is intended as an aid in  the diagnosis of influenza from Nasopharyngeal swab specimens and  should not be used as a sole basis for treatment. Nasal washings and  aspirates are unacceptable for Xpert Xpress SARS-CoV-2/FLU/RSV  testing.  Fact Sheet for Patients: PinkCheek.be  Fact Sheet for Healthcare Providers: GravelBags.it  This test is not yet approved or cleared by the [place] FDA and  has been authorized for detection and/or diagnosis of SARS-CoV-2 by  FDA under an Emergency Use Authorization (EUA). This EUA will remain  in effect (meaning this test can be used) for the duration of the  Covid-19 declaration under Section 564(b)(1) of the Act, 21  U.S.C. section 360bbb-3(b)(1), unless the authorization is   terminated or revoked.    Respiratory Syncytial Virus by PCR NEGATIVE NEGATIVE Final    Comment: (NOTE) Fact Sheet for Patients: PinkCheek.be  Fact Sheet for Healthcare Providers: GravelBags.it  This test is not yet approved or cleared by the [place] FDA and  has been authorized for detection and/or diagnosis of SARS-CoV-2 by  FDA under an Emergency Use Authorization (EUA). This EUA will remain  in effect (meaning this test can be used) for the duration of the  COVID-19 declaration under Section 564(b)(1) of the Act, 21 U.S.C.  section 360bbb-3(b)(1), unless the authorization is terminated or  revoked. Performed at [facility], [address], [place], [place] [address]   Aerobic Culture (superficial specimen)     Status: None   Collection Time: 11/29/19  5:23 PM   Specimen: Wound  Result Value Ref Range Status   Specimen Description   Final    WOUND Performed at [facility], [address], [place], [place] [address]    Special Requests   Final    RIGHT LEG Performed at [facility], [address] [place], [place] [address]    Gram Stain   Final    FEW WBC PRESENT, PREDOMINANTLY PMN RARE GRAM POSITIVE COCCI Performed at [facility], [place] [address], [place], [place] [address]    Culture RARE PSEUDOMONAS AERUGINOSA  Final   Report Status 12/02/2019 FINAL  Final   Organism ID, Bacteria PSEUDOMONAS AERUGINOSA  Final      Susceptibility   Pseudomonas aeruginosa - MIC*    CEFTAZIDIME >=64 RESISTANT Resistant     CIPROFLOXACIN <=0.25 SENSITIVE Sensitive     GENTAMICIN <=1 SENSITIVE Sensitive     IMIPENEM 2 SENSITIVE Sensitive     CEFEPIME 16 RESISTANT Resistant     * RARE PSEUDOMONAS AERUGINOSA  Respiratory Panel by RT PCR (Flu A&B, Covid) - Nasopharyngeal Swab     Status: None   Collection Time: 12/05/19 10:04 PM   Specimen: Nasopharyngeal Swab  Result Value Ref  Range Status   SARS Coronavirus 2 by RT PCR NEGATIVE NEGATIVE Final    Comment: (NOTE) SARS-CoV-2 target nucleic acids are NOT DETECTED.  The SARS-CoV-2 RNA is generally detectable in upper respiratoy specimens during the acute phase of infection. The lowest concentration of SARS-CoV-2 viral copies this assay can detect is 131 copies/mL. A negative result does not preclude SARS-Cov-2 infection and should not be used as the sole basis for treatment or other patient management decisions. A negative result may occur with  improper specimen collection/handling, submission of specimen other than nasopharyngeal swab, presence of viral mutation(s) within the areas targeted by this assay, and inadequate number of viral copies (<131 copies/mL). A negative result must be combined with clinical observations, patient history, and epidemiological information. The expected result is Negative.  Fact Sheet for Patients:  PinkCheek.be  Fact Sheet for Healthcare Providers:  GravelBags.it  This test is no t yet approved or cleared by the [place] FDA and  has been authorized for detection and/or diagnosis of SARS-CoV-2 by FDA under an Emergency Use Authorization (EUA). This EUA will remain  in effect (meaning this test can be used) for the duration of the COVID-19 declaration under Section 564(b)(1) of the Act, 21 U.S.C. section 360bbb-3(b)(1), unless the authorization is terminated or revoked sooner.     Influenza A by PCR NEGATIVE NEGATIVE Final   Influenza B by PCR NEGATIVE NEGATIVE Final    Comment: (NOTE) The Xpert Xpress SARS-CoV-2/FLU/RSV assay is intended as an aid in  the diagnosis of influenza from Nasopharyngeal swab specimens and  should not be used as a sole basis for treatment. Nasal washings and  aspirates are unacceptable for Xpert Xpress SARS-CoV-2/FLU/RSV  testing.  Fact Sheet for  Patients: PinkCheek.be  Fact Sheet for Healthcare Providers: GravelBags.it  This test is not yet approved or cleared by the [place] FDA and  has been authorized for detection and/or diagnosis of SARS-CoV-2 by  FDA under an Emergency Use Authorization (EUA). This EUA will remain  in effect (meaning this test can be used) for the duration of the  Covid-19 declaration under Section 564(b)(1) of the Act, 21  U.S.C. section 360bbb-3(b)(1), unless the authorization is  terminated or revoked. Performed at [facility], [address] [place], [place] [address]     Radiology Studies: No results found.   Jadan Rouillard T. [place]  If 7PM-7AM, please contact night-coverage [email or web address] 12/06/2019, 10:19 AM

## 2019-12-06 NOTE — TOC Initial Note (Signed)
Transition of Care [facility]) - Initial/Assessment Note    Patient Details  Name: [name] MRN: [id number] Date of Birth: February 22, 1970  Transition of Care [facility]) CM/SW Contact:    Iona Beard, [place] Phone Number: 12/06/2019, 6:31 PM  Clinical Narrative:                 Pt admitted for Hyperkalemia. PT recommended SNF for pt however, pt left hospital AMA yesterday (11/15). Pt back to hospital and TOC received consult. This CSW was updated by 1st shift CSW Adelene Amas that pt was not willing to go to SNF before leaving AMA and that [facility]ne Cornea with [facility] was agreeable to take pt but needs to verify that they can do IV antibiotics, they will find out tomorrow (11/17). CSW completed assessment with pt. Pt states that he lives alone and can complete ADLs independently. Pt states that he drives. Pt states that he has a walker and rollator at home to use when needed. Pt states that he has had HH in the past and that is what he needed now. CSW informed pt that if he was willing there was a [facility] agency possibly able to accept him. Pt states that he wants HH. CSW informed pt that TOC will f/u tomorrow to confirm that [facility] agency can accept pt. TOC to follow.   Expected Discharge Plan: [place] Barriers to Discharge: Continued Medical Work up   Patient Goals and CMS Choice Patient states their goals for this hospitalization and ongoing recovery are:: Return home with [facility]   Choice offered to / list presented to : NA  Expected Discharge Plan and Services Expected Discharge Plan: [place] In-house Referral: Clinical Social Work Discharge Planning Services: CM Consult Post Acute Care Choice: [place] arrangements for the past 2 months: Single Family Home                 DME Arranged: N/A DME Agency: NA         HH Agency: [place] ([place])        Prior Living Arrangements/Services Living arrangements for the past 2 months:  Single Family Home Lives with:: Self Patient language and need for interpreter reviewed:: Yes Do you feel safe going back to the place where you live?: Yes      Need for Family Participation in Patient Care: No (Comment)   Current home services: DME (rollator and 2 wheel walker) Criminal Activity/Legal Involvement Pertinent to Current Situation/Hospitalization: No - Comment as needed  Activities of Daily Living Home Assistive Devices/Equipment: CPAP ADL Screening (condition at time of admission) Patient's cognitive ability adequate to safely complete daily activities?: Yes Is the patient deaf or have difficulty hearing?: No Does the patient have difficulty seeing, even when wearing glasses/contacts?: No Does the patient have difficulty concentrating, remembering, or making decisions?: No Patient able to express need for assistance with ADLs?: Yes Does the patient have difficulty dressing or bathing?: No Independently performs ADLs?: Yes (appropriate for developmental age) Does the patient have difficulty walking or climbing stairs?: Yes Weakness of Legs: Both Weakness of Arms/Hands: None  Permission Sought/Granted                  Emotional Assessment Appearance:: Appears stated age Attitude/Demeanor/Rapport: Engaged Affect (typically observed): Accepting Orientation: : Oriented to Self, Oriented to Place, Oriented to Situation, Oriented to  Time Alcohol / Substance Use: Not Applicable Psych Involvement: No (comment)  Admission diagnosis:  Hyperkalemia [E87.5] Cellulitis of right lower extremity [L03.115] Patient Active Problem List   Diagnosis Date Noted  . Hyperkalemia 12/05/2019  . History of Renal transplant failure 12/05/2019  . Pseudomonas aeruginosa wound infection 12/05/2019  . Systemic lupus erythematosus, unspecified ([place]) 11/30/2019  . Paroxysmal atrial fibrillation (HCC)   . Severe sepsis ([place]) 11/17/2019  . Cellulitis of right lower extremity 11/17/2019   . Lactic acidosis 11/17/2019  . Hypoglycemia 11/17/2019  . Anemia   . GERD (gastroesophageal reflux disease)   . ESRD on hemodialysis ([place])   . Murmur 03/25/2018  . Laboratory examination 03/25/2018  . Tendonitis of both rotator cuffs 12/29/2017  . Greater trochanteric bursitis of both hips 12/29/2017  . Diverticulitis of large intestine without perforation or abscess without bleeding   . Diverticulitis 07/01/2017  . 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 At risk for sepsis 09/08/2015  . GI bleed 12/09/2014   PCP:  [place]:   [id number] [place], [address] [place] [address] Phone: [phone] Fax: [phone]     Social Determinants of Health (SDOH) Interventions    Readmission Risk Interventions Readmission Risk Prevention Plan 12/06/2019 11/24/2019  Transportation Screening Complete Complete  PCP or Specialist Appt within 3-5 Days - Complete  HRI or [place] Complete Complete  Social Work Consult for [place] Planning/Counseling Complete Complete  Palliative Care Screening Not Applicable Not Applicable  Medication Review Press photographer) - Referral to Pharmacy  Some recent data might be hidden

## 2019-12-06 NOTE — ED Notes (Signed)
Iv started  meds given  Pt watching tv

## 2019-12-06 NOTE — ED Notes (Signed)
Provider at bedside. Provider explained to pt that he is only ordering PO pain med.

## 2019-12-06 NOTE — ED Notes (Signed)
Pt awake, warm blankets applied and head of bed raised per pt request. Ginger ale provided. RN attempted to draw morning labs without success, pt agreeable to calling lab and attempting blood draw.  IV remains patent and flushes without difficulty. Pt verbalized the pain in his right arm has decreased. Arm remains significantly swollen.

## 2019-12-06 NOTE — Progress Notes (Signed)
UF remains off at this time due to continued decreased blood pressure. Will continue to monitor

## 2019-12-06 NOTE — Progress Notes (Signed)
PT Cancellation Note  Patient Details Name: KENTA LASTER MRN: [id number] DOB: 1970/07/30   Cancelled Treatment:    Reason Eval/Treat Not Completed: Medical issues which prohibited therapy Pt off the floor, K+ = 6 and he is therefore out of appropriate levels for activity with PT anyway.  Will hold and attempt tomorrow as appropriate.   Kreg Shropshire, DPT 12/06/2019, 1:29 PM

## 2019-12-06 NOTE — ED Notes (Signed)
Attending messaged back and said will come see pt and evaluate needed pain mgmt.

## 2019-12-06 NOTE — ED Notes (Signed)
Provided warm blanket and graham crackers.

## 2019-12-06 NOTE — ED Notes (Signed)
Pt requesting IV dilaudid for pain mgmt. Messaged attending.

## 2019-12-06 NOTE — Progress Notes (Signed)
Readmission noted, spoke with patient's dialysis center to inform. As per previous admission, dialysis center will not accept patient into center unless he has a Negative Covid test no more than 5 days prior to treatment. Patient was in transition to another dialysis center prior to last admission. He will be going to [facility] TTS 5:45am. Please contact me with any dialysis placement concerns.  [name] Dialysis Coordinator [phone]

## 2019-12-06 NOTE — ED Notes (Signed)
Spoke with dialysis nurse. She said to have pt come at 10am. Informed her that morning labs still need to be drawn (BMP). Printed label to send with pt.

## 2019-12-06 NOTE — ED Notes (Signed)
Report off to shannon rn

## 2019-12-06 NOTE — Progress Notes (Signed)
OT Cancellation Note  Patient Details Name: [name] MRN: [id number] DOB: Apr 01, 1970   Cancelled Treatment:    Reason Eval/Treat Not Completed: Patient at procedure or test/ unavailable. OT order received and chart reviewed. OT to see pt for evaluation when he is next available.   Darleen Crocker, MS, OTR/L , CBIS ascom [phone]  12/06/19, 1:18 PM   12/06/2019, 1:17 PM

## 2019-12-06 NOTE — ED Notes (Signed)
Report called to [place] rn all questions answered

## 2019-12-06 NOTE — Progress Notes (Signed)
Patient has home CPAP at bedside, Unit assessed and no cracks nor frayed wires found upon assessing.

## 2019-12-06 NOTE — ED Notes (Signed)
Blood work has not yet been drawn. RN called Lab to verify they were still aware.

## 2019-12-06 NOTE — ED Notes (Signed)
Pt taken to dialysis by [facility]

## 2019-12-06 NOTE — ED Notes (Addendum)
Pt refusing BP due to pain in both legs and right arm. Unable to obtain BP on left arm due to dialysis access.

## 2019-12-06 NOTE — ED Notes (Signed)
Dialysis nurse called. They are ready to send pt back to ED. Pt will go to room 12.

## 2019-12-06 NOTE — Progress Notes (Signed)
Noted patient blood pressure decreased at this time. Patient requesting to leave UF on at this time and recheck in 15 min.

## 2019-12-06 NOTE — Progress Notes (Signed)
UF off at this time due to decrease in blood pressure. Will continue to monitor and recheck bp.

## 2019-12-06 NOTE — Progress Notes (Signed)
Dr. Lynder Parents, NP made aware of decreased blood pressure prior to treatment. Patient asymptomatic at this time. UF goal 1018ml to be obtained during todays treatment per patient request. Treatment started without any issues or concerns. Will continue to monitor.

## 2019-12-06 NOTE — ED Notes (Signed)
Breakfast tray provided to pt.

## 2019-12-06 NOTE — ED Notes (Signed)
Entire RUE is edematous. Pt rates pain in RUE as 8/10.

## 2019-12-06 NOTE — ED Notes (Signed)
Nephrologist at bedside

## 2019-12-06 NOTE — Progress Notes (Signed)
[facility]  ROUNDING NOTE   Subjective:    Patient left AMA yesterday, but came back today as his outpatient dialysis center was not able to provide care for him according to their protocol, his Covid test was not within 5 days.   Patient seen in dialysis today, tolerating treatment well    HEMODIALYSIS FLOWSHEET:  Blood Flow Rate (mL/min): 400 mL/min Arterial Pressure (mmHg): -150 mmHg Venous Pressure (mmHg): 150 mmHg Transmembrane Pressure (mmHg): 80 mmHg Ultrafiltration Rate (mL/min): 360 mL/min Dialysate Flow Rate (mL/min): 800 ml/min Conductivity: Machine : 13.8 Conductivity: Machine : 13.8 Dialysis Fluid Bolus: Normal Saline Bolus Amount (mL): 200 mL   Objective:  Vital signs in last 24 hours:  Temp:  [97.8 F (36.6 C)-98.2 F (36.8 C)] 98.2 F (36.8 C) (11/16 1015) Pulse Rate:  [54-111] 75 (11/16 1215) Resp:  [11-23] 23 (11/16 1215) BP: (92-119)/(40-69) 101/62 (11/16 1215) SpO2:  [72 %-100 %] 100 % (11/16 1215) Weight:  [95.3 kg] 95.3 kg (11/15 1757)  Weight change:  Filed Weights   12/05/19 1757  Weight: 95.3 kg    Intake/Output: No intake/output data recorded.   Intake/Output this shift:  No intake/output data recorded.  Physical Exam: General:  Resting in bed, in no acute distress  Head:  Oral mucous membranes moist  Eyes:  Anicteric  Lungs:   Lungs clear to auscultation bilaterally, respiratory effort normal and symmetrical  Heart:  Irregular rhythm, HR in 70s  Abdomen:  Soft, non tender,non distended  Extremities:  1+ peripheral edema  Neurologic:  Oriented, speech clear and appropriate  Skin: Rt leg with open wound to mid calf  Access: Left AVG +bruit,+thrill    Basic Metabolic Panel: Recent Labs  Lab 12/01/19 1100 12/01/19 1100 12/02/19 0727 12/02/19 0727 12/03/19 0723 12/03/19 0723 12/04/19 0453 12/04/19 0453 12/05/19 0432 12/05/19 0432 12/05/19 1827 12/05/19 2335 12/06/19 0925  NA 139   < > 137   < > 135   < >  133*  --  139  --  136 139 135  K 4.4   < > 4.6   < > 5.1   < > 4.8  --  5.9*  --  6.5* 6.5* 6.0*  CL 99   < > 97*   < > 96*   < > 95*  --  99  --  98 97* 96*  CO2 25   < > 29   < > 29   < > 28  --  29  --  24 29 27   GLUCOSE 75   < > 80   < > 66*   < > 103*  --  79  --  113* 91 112*  BUN 42*   < > 33*   < > 44*   < > 34*  --  48*  --  54* 55* 58*  CREATININE 5.06*   < > 4.69*   < > 5.55*   < > 4.37*  --  5.36*  --  5.95* 6.23* 6.50*  CALCIUM 9.1   < > 9.1   < > 8.9   < > 8.6*   < > 9.2   < > 8.6* 9.3 9.0  MG 1.8  --  1.9  --  1.9  --  1.8  --  2.0  --   --   --   --   PHOS 4.4  --  4.3  --  5.1*  --  4.7*  --  5.8*  --   --   --   --    < > =  values in this interval not displayed.    Liver Function Tests: Recent Labs  Lab 12/01/19 1100 12/02/19 0727 12/03/19 0723 12/04/19 0453 12/05/19 0432  ALBUMIN 2.7* 2.6* 2.5* 2.5* 2.6*   No results for input(s): LIPASE, AMYLASE in the last 168 hours. No results for input(s): AMMONIA in the last 168 hours.  CBC: Recent Labs  Lab 12/03/19 0723 12/04/19 0453 12/05/19 0432 12/05/19 1827 12/05/19 2335  WBC 12.5* 10.8* 9.7 11.5* 10.7*  NEUTROABS 10.0* 8.8* 7.8* 10.2*  --   HGB 8.8* 8.6* 8.6* 9.2* 9.0*  HCT 28.0* 27.8* 28.8* 28.4* 29.0*  MCV 96.2 97.9 98.0 93.7 96.7  PLT 101* 104* 110* 106* 92*    Cardiac Enzymes: No results for input(s): CKTOTAL, CKMB, CKMBINDEX, TROPONINI in the last 168 hours.  BNP: Invalid input(s): POCBNP  CBG: No results for input(s): GLUCAP in the last 168 hours.  Microbiology: Results for orders placed or performed during the hospital encounter of 12/05/19  Respiratory Panel by RT PCR (Flu A&B, Covid) - Nasopharyngeal Swab     Status: None   Collection Time: 12/05/19 10:04 PM   Specimen: Nasopharyngeal Swab  Result Value Ref Range Status   SARS Coronavirus 2 by RT PCR NEGATIVE NEGATIVE Final    Comment: (NOTE) SARS-CoV-2 target nucleic acids are NOT DETECTED.  The SARS-CoV-2 RNA is generally  detectable in upper respiratoy specimens during the acute phase of infection. The lowest concentration of SARS-CoV-2 viral copies this assay can detect is 131 copies/mL. A negative result does not preclude SARS-Cov-2 infection and should not be used as the sole basis for treatment or other patient management decisions. A negative result may occur with  improper specimen collection/handling, submission of specimen other than nasopharyngeal swab, presence of viral mutation(s) within the areas targeted by this assay, and inadequate number of viral copies (<131 copies/mL). A negative result must be combined with clinical observations, patient history, and epidemiological information. The expected result is Negative.  Fact Sheet for Patients:  PinkCheek.be  Fact Sheet for Healthcare Providers:  GravelBags.it  This test is no t yet approved or cleared by the [place] FDA and  has been authorized for detection and/or diagnosis of SARS-CoV-2 by FDA under an Emergency Use Authorization (EUA). This EUA will remain  in effect (meaning this test can be used) for the duration of the COVID-19 declaration under Section 564(b)(1) of the Act, 21 U.S.C. section 360bbb-3(b)(1), unless the authorization is terminated or revoked sooner.     Influenza A by PCR NEGATIVE NEGATIVE Final   Influenza B by PCR NEGATIVE NEGATIVE Final    Comment: (NOTE) The Xpert Xpress SARS-CoV-2/FLU/RSV assay is intended as an aid in  the diagnosis of influenza from Nasopharyngeal swab specimens and  should not be used as a sole basis for treatment. Nasal washings and  aspirates are unacceptable for Xpert Xpress SARS-CoV-2/FLU/RSV  testing.  Fact Sheet for Patients: PinkCheek.be  Fact Sheet for Healthcare Providers: GravelBags.it  This test is not yet approved or cleared by the [place] FDA and   has been authorized for detection and/or diagnosis of SARS-CoV-2 by  FDA under an Emergency Use Authorization (EUA). This EUA will remain  in effect (meaning this test can be used) for the duration of the  Covid-19 declaration under Section 564(b)(1) of the Act, 21  U.S.C. section 360bbb-3(b)(1), unless the authorization is  terminated or revoked. Performed at [facility], [address], [place], [place] [address]     Coagulation Studies: No results  for input(s): LABPROT, INR in the last 72 hours.  Urinalysis: No results for input(s): COLORURINE, LABSPEC, PHURINE, GLUCOSEU, HGBUR, BILIRUBINUR, KETONESUR, PROTEINUR, UROBILINOGEN, NITRITE, LEUKOCYTESUR in the last 72 hours.  Invalid input(s): APPERANCEUR    Imaging: No results found.   Medications:    [START ON 12/07/2019] meropenem (MERREM) IV      aspirin EC  81 mg Oral Daily   feeding supplement (NEPRO CARB STEADY)  237 mL Oral BID BM   ferrous sulfate  325 mg Oral BID   heparin  5,000 Units Subcutaneous Q8H   midodrine  10 mg Oral TID WC   multivitamin  1 tablet Oral QHS   pantoprazole  40 mg Oral BID   patiromer  16.8 g Oral Daily   predniSONE  10 mg Oral Daily   traZODone  50 mg Oral QHS   acetaminophen **OR** acetaminophen, loperamide, ondansetron **OR** ondansetron (ZOFRAN) IV, oxyCODONE, promethazine  Assessment/ Plan:  [name] is a 49 y.o. black male with end stage renal disease on hemodialysis, history of kidney transplant, lupus nephritis, and hypotension who was admitted to [facility] on 12/05/2019 for Hyperkalemia [E87.5]  [facility] [place]/ Left AVG/TTS  # End stage renal disease.  Patient left AMA yesterday, back today as his outpatient dialysis center was not able to provide care, due to his recent Covid test status was not available Receiving inpatient hemodialysis today, tolerating treatment well We will continue Tuesday Thursday Saturday schedule  #Anemia with  chronic kidney disease.   Lab Results  Component Value Date   HGB 9.0 (L) 12/05/2019  Will plan for Epogen with dialysis Continue oral iron supplements  #Secondary Hyperparathyroidism Lab Results  Component Value Date   CALCIUM 9.0 12/06/2019   PHOS 5.8 (H) 12/05/2019    #Hyperkalemia Potassium 6.0 Receiving dialysis with 2K bath    LOS: 0 Trevonn Hallum 11/16/202112:26 PM  I

## 2019-12-06 NOTE — ED Notes (Signed)
Pt given meal tray and ginger ale to drink

## 2019-12-06 NOTE — ED Notes (Signed)
Pt states prefers to get pain med once dialysis is completed so it does not get washed out of his system.

## 2019-12-06 NOTE — ED Notes (Signed)
Lab came to draw morning labs. Lab was unable to draw adequate amount of blood for labs.

## 2019-12-07 LAB — CBC WITH DIFFERENTIAL/PLATELET
Abs Immature Granulocytes: 0.06 10*3/uL (ref 0.00–0.07)
Basophils Absolute: 0 10*3/uL (ref 0.0–0.1)
Basophils Relative: 0 %
Eosinophils Absolute: 0.1 10*3/uL (ref 0.0–0.5)
Eosinophils Relative: 1 %
HCT: 25.7 % — ABNORMAL LOW (ref 39.0–52.0)
Hemoglobin: 8.1 g/dL — ABNORMAL LOW (ref 13.0–17.0)
Immature Granulocytes: 1 %
Lymphocytes Relative: 14 %
Lymphs Abs: 1.1 10*3/uL (ref 0.7–4.0)
MCH: 30.1 pg (ref 26.0–34.0)
MCHC: 31.5 g/dL (ref 30.0–36.0)
MCV: 95.5 fL (ref 80.0–100.0)
Monocytes Absolute: 0.6 10*3/uL (ref 0.1–1.0)
Monocytes Relative: 8 %
Neutro Abs: 6 10*3/uL (ref 1.7–7.7)
Neutrophils Relative %: 76 %
Platelets: 96 10*3/uL — ABNORMAL LOW (ref 150–400)
RBC: 2.69 MIL/uL — ABNORMAL LOW (ref 4.22–5.81)
RDW: 18.7 % — ABNORMAL HIGH (ref 11.5–15.5)
WBC: 7.9 10*3/uL (ref 4.0–10.5)
nRBC: 0 % (ref 0.0–0.2)

## 2019-12-07 LAB — RENAL FUNCTION PANEL
Albumin: 2.4 g/dL — ABNORMAL LOW (ref 3.5–5.0)
Anion gap: 12 (ref 5–15)
BUN: 38 mg/dL — ABNORMAL HIGH (ref 6–20)
CO2: 29 mmol/L (ref 22–32)
Calcium: 8.6 mg/dL — ABNORMAL LOW (ref 8.9–10.3)
Chloride: 95 mmol/L — ABNORMAL LOW (ref 98–111)
Creatinine, Ser: 4.78 mg/dL — ABNORMAL HIGH (ref 0.61–1.24)
GFR, Estimated: 14 mL/min — ABNORMAL LOW (ref >60)
Glucose, Bld: 73 mg/dL (ref 70–99)
Phosphorus: 5.2 mg/dL — ABNORMAL HIGH (ref 2.5–4.6)
Potassium: 5.4 mmol/L — ABNORMAL HIGH (ref 3.5–5.1)
Sodium: 136 mmol/L (ref 135–145)

## 2019-12-07 LAB — MAGNESIUM: Magnesium: 1.8 mg/dL (ref 1.7–2.4)

## 2019-12-07 MED ORDER — CALCIUM CARBONATE ANTACID 500 MG PO CHEW
1.0000 | CHEWABLE_TABLET | Freq: Four times a day (QID) | ORAL | Status: DC | PRN
  Filled 2019-12-07 (×2): qty 1

## 2019-12-07 MED ORDER — COLLAGENASE 250 UNIT/GM EX OINT
TOPICAL_OINTMENT | Freq: Every day | CUTANEOUS | Status: DC
  Administered 2019-12-21 – 2019-12-23 (×2): 1 via TOPICAL
  Filled 2019-12-07 (×2): qty 30

## 2019-12-07 MED ORDER — COLLAGENASE 250 UNIT/GM EX OINT
TOPICAL_OINTMENT | CUTANEOUS | Status: DC | PRN
  Filled 2019-12-07: qty 30

## 2019-12-07 MED ORDER — EPOETIN ALFA 10000 UNIT/ML IJ SOLN
10000.0000 [IU] | INTRAMUSCULAR | Status: DC
  Administered 2019-12-08 – 2019-12-22 (×6): 10000 [IU] via INTRAVENOUS
  Filled 2019-12-07 (×6): qty 1

## 2019-12-07 NOTE — Progress Notes (Addendum)
CSW informed by Dr. Priscella Mann that patient is now agreeable with going to rehab. Patient needs new PT/OT Evals. CSW sent secure email to [employer] to see if patient is service connected and can go to rehab if recommended, waiting for confirmation.   10:05- Confirmation received from [facility] who reported patient is 100% service connected. She reported patient goes to the [facility] and provided contact information for their Representative April Alexander. CSW will reach out to April.   1:45- PT recommending inpatient rehab. Call to April with [facility] [phone] ext. [phone]). Left voicemail requesting a return call for [facility] process for inpatient rehab.  Oleh Genin, [place]

## 2019-12-07 NOTE — Evaluation (Signed)
Occupational Therapy Evaluation Patient Details Name: [name] MRN: [id number] DOB: 11-21-1970 Today's Date: 12/07/2019    History of Present Illness Recently here and left AMA to try and get dialysis off site, unable and returns within a few hours. 2 weeks ago came in secondary to R LE swelling, pain; admitted for management of severe cellulitis of R LE.  Hospital course additionally significant for hypotension, requiring transfer to CCU and pressor support; episode of vtach with synchronized cardioversion (10/30), CRRT (via temp dialysis cath, left) 10/30-11/2.    Clinical Impression   Patient presenting with decreased I in self care, balance, functional mobility/transfers, endurance, and safety awareness.  Patient reports living at home alone without use of AD and driving self to dialysis ( MWF) PTA. Pt reporting during this session, " I know I can't go home and do it by myself".  Pt verbalized fatigue from PT evaluation and declined EOB and functional transfers but does request assist with self care. OT providing pt with basin of warm water and wash cloths and able to wash UB and peri hygiene with set up A. Set upA to don clean hospital gown. Pt  Patient will benefit from acute OT to increase overall independence in the areas of ADLs, functional mobility, and safety awareness in order to safely discharge to next venue of care.    Follow Up Recommendations  Supervision/Assistance - 24 hour;Other (comment) (inpatient rehab)    Equipment Recommendations  Other (comment) (defer to next venue of care)       Precautions / Restrictions Precautions Precautions: Fall Precaution Comments: L AVF, R LE wound (ace bandaged) Restrictions Weight Bearing Restrictions: No      Mobility Bed Mobility Overal bed mobility: Needs Assistance Bed Mobility: Rolling Rolling: Min assist   Supine to sit: Min assist Sit to supine: Min assist   General bed mobility comments: Pt declined OOB activity  secondary to reported fatigue.    Transfers Overall transfer level: Needs assistance Equipment used: Rolling walker (2 wheeled) Transfers: Sit to/from Stand Sit to Stand: From elevated surface;Mod assist         General transfer comment: Pt with poor quad strength/control and was unable to give any initiatory effort in getting to standing even from elevated height.      Balance Overall balance assessment: Needs assistance Sitting-balance support: No upper extremity supported;Feet supported Sitting balance-Leahy Scale: Good Sitting balance - Comments: no issues sitting EOB     Standing balance-Leahy Scale: Fair Standing balance comment: patient relying heavily on rolling walker for UE support in standing position, knees in static hyperextension to keep from buclkling        ADL either performed or assessed with clinical judgement   ADL Overall ADL's : Needs assistance/impaired     Grooming: Wash/dry hands;Wash/dry face;Oral care;Set up   Upper Body Bathing: Set up;Bed level       Upper Body Dressing : Set up;Bed level      General ADL Comments: Pt declined sitting up onto EOB but requesting to wash self and change gown. OT provided warm soapy water and pt washing UB and peri area with setup  A overall. Set up A to don hospital gown. Pt would likely need assistance to wash B LEs and buttocks.     Vision Baseline Vision/History: Wears glasses Wears Glasses: At all times Patient Visual Report: No change from baseline              Pertinent Vitals/Pain Pain Assessment: No/denies pain  Pain Score: 0-No pain Pain Location: bilateral foot pain      Hand Dominance Left   Extremity/Trunk Assessment Upper Extremity Assessment Upper Extremity Assessment: Generalized weakness   Lower Extremity Assessment Lower Extremity Assessment: Generalized weakness       Communication Communication Communication: No difficulties   Cognition Arousal/Alertness:  Awake/alert Behavior During Therapy: WFL for tasks assessed/performed Overall Cognitive Status: Within Functional Limits for tasks assessed Area of Impairment: Attention;Memory;Following commands;Safety/judgement;Awareness;Problem solving     General Comments: Pt A & O x4   General Comments  Pt with b/l LE edema, ostensibly new onset profound weakness    Exercises Exercises: Other exercises;General Lower Extremity General Exercises - Lower Extremity Ankle Circles/Pumps: AAROM;PROM;10 reps Quad Sets: Strengthening;10 reps Short Arc Quad: AAROM;AROM;10 reps Heel Slides: AROM;AAROM;10 reps (lightly resisted leg extensions, very weak quads b/l) Hip ABduction/ADduction: AROM;Strengthening;10 reps Straight Leg Raises:  (unable)        Home Living Family/patient expects to be discharged to:: Private residence Living Arrangements: Alone   Type of Home: House Home Access: Ramped entrance     Home Layout: One level      Home Equipment: None   Additional Comments: Patient questionable historian; will verify with family as available      Prior Functioning/Environment Level of Independence: Independent        Comments: Per patient report, indep with ADLs, household and community mobilization without assist device; drives to/from dialysis MWF.  Patient questionable historian; will verify with family as available        OT Problem List: Decreased strength;Decreased coordination;Decreased safety awareness;Decreased activity tolerance;Decreased knowledge of use of DME or AE;Impaired balance (sitting and/or standing);Decreased knowledge of precautions;Pain      OT Treatment/Interventions: Self-care/ADL training;Therapeutic exercise;Therapeutic activities;Energy conservation;Visual/perceptual remediation/compensation;DME and/or AE instruction;Patient/family education;Balance training    OT Goals(Current goals can be found in the care plan section) Acute Rehab OT Goals Patient  Stated Goal: to go to rehab  OT Goal Formulation: With patient Time For Goal Achievement: 12/21/19 Potential to Achieve Goals: Good  OT Frequency: Min 2X/week   Barriers to D/C: Decreased caregiver support  lives alone          AM-PAC OT "6 Clicks" Daily Activity     Outcome Measure Help from another person eating meals?: None Help from another person taking care of personal grooming?: A Little Help from another person toileting, which includes using toliet, bedpan, or urinal?: A Lot Help from another person bathing (including washing, rinsing, drying)?: A Lot Help from another person to put on and taking off regular upper body clothing?: A Little Help from another person to put on and taking off regular lower body clothing?: A Lot 6 Click Score: 16   End of Session Nurse Communication: Mobility status;Precautions  Activity Tolerance:   Patient left: in bed;with call bell/phone within reach;with bed alarm set  OT Visit Diagnosis: Unsteadiness on feet (R26.81);Muscle weakness (generalized) (M62.81)                Time: 0240-9735 OT Time Calculation (min): 21 min Charges:  OT General Charges $OT Visit: 1 Visit OT Evaluation $OT Eval Low Complexity: 1 Low OT Treatments $Self Care/Home Management : 8-22 mins  Darleen Crocker, MS, OTR/L , CBIS ascom [phone]  12/07/19, 2:55 PM

## 2019-12-07 NOTE — Progress Notes (Addendum)
  Rehab Admissions Coordinator Note:  Patient was screened by Cleatrice Burke for appropriateness for an Inpatient Acute Rehab admission at [facility] per therapy recommendations.  I am familiar with this patient from recent admit. [facility] has [facility] contracts therefore they were pursuing VA connected inpt rehab . Noted [facility], SW pursuing VA connected rehab. We will not consult at this time as we do not have [facility] contract.  Cleatrice Burke RN MSN 12/07/2019, 2:49 PM  I can be reached at [phone].

## 2019-12-07 NOTE — Progress Notes (Signed)
PROGRESS NOTE    [name]  DJM:[id number] DOB: [birth year] DOA: 12/05/2019 PCP: [facility]   Brief Narrative:  49 y.o. 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 He remains on IV meropenem.  No indication of sepsis or septic shock at this time.  Patient was only out of the hospital for a few hours.  Nephrology consulted.  Message sent to ID for recommendations.   Assessment & Plan:   Principal Problem:   Hyperkalemia Active Problems:   Cellulitis of right lower extremity   ESRD on hemodialysis (HCC)   Systemic lupus erythematosus, unspecified ([place])   History of Renal transplant failure   Pseudomonas aeruginosa wound infection  Hyperkalemia   ESRD on hemodialysis ([place]) with history of failed renal transplant -Patient received calcium gluconate and Veltassa in the emergency room Hemodialysis 12/06/2019 Patient has been refusing his Veltassa Plan: Continue Veltassa per nephrology recommendations Hyperkalemia improved    Pseudomonas aeruginosa wound infection   Cellulitis of right lower extremity   Wound right lower leg Message sent to ID to determine appropriate end date for IV meropenem.  Continue for now.  Wound care consulted.  Leg elevation.  We will plan to discharge to inpatient rehab.  Patient is service-connected.  Therapy evaluations requested.    Systemic lupus erythematosus, unspecified ([place]) -Continue prednisone   DVT prophylaxis: Heparin subcutaneous Code Status: Full  code Family Communication: None today Disposition Plan: Status is: Inpatient  Remains inpatient appropriate because:IV treatments appropriate due to intensity of illness or inability to take PO   Dispo: The patient is from: Home              Anticipated d/c is to: SNF              Anticipated d/c date is: 1 day              Patient currently is not medically stable to d/c.   Consultants:   Nephrology  Procedures:   None  Antimicrobials:   Meropenem   Subjective: Seen and examined.  No pain complaints.  Concerned about healing of wound on right leg.  Objective: Vitals:   12/06/19 2338 12/07/19 0434 12/07/19 0739 12/07/19 1124  BP: 136/64 (!) 128/58 (!) 133/59 (!) 107/49  Pulse: 71 73 72 74  Resp: 18  16 15   Temp: 98.2 F (36.8 C) 97.6 F (36.4 C) 98.2 F (36.8 C) 98.2 F (36.8 C)  TempSrc: Oral Oral Oral Oral  SpO2: 100% 100% 100% 100%  Weight:      Height:        Intake/Output Summary (Last 24 hours) at 12/07/2019 1318 Last data filed at 12/07/2019 1002 Gross per 24 hour  Intake 240 ml  Output --  Net 240 ml   Filed Weights   12/05/19 1757  Weight: 95.3 kg    Examination:  General exam: Appears calm and comfortable  Respiratory system: Clear to auscultation. Respiratory effort normal. Cardiovascular system: S1 & S2 heard, RRR. No JVD, murmurs, rubs, gallops or clicks. No pedal edema.  Gastrointestinal system: Abdomen is nondistended, soft and nontender. No organomegaly or masses felt. Normal bowel sounds heard. Central nervous system: Alert and oriented. No focal neurological deficits. Extremities: Symmetric 5 x 5 power.  LUE AVG Skin: Superficial wound on right shin.  No drainage. Psychiatry: Judgement and insight appear normal. Mood & affect appropriate.     Data Reviewed: I have personally reviewed following labs and imaging studies  CBC: Recent Labs  Lab 12/03/19 0723 12/03/19 0723 12/04/19 0453 12/05/19 0432 12/05/19 1827  12/05/19 2335 12/07/19 0724  WBC 12.5*   < > 10.8* 9.7 11.5* 10.7* 7.9  NEUTROABS 10.0*  --  8.8* 7.8* 10.2*  --  6.0  HGB 8.8*   < > 8.6* 8.6* 9.2* 9.0* 8.1*  HCT 28.0*   < > 27.8* 28.8* 28.4* 29.0* 25.7*  MCV 96.2   < > 97.9 98.0 93.7 96.7 95.5  PLT 101*   < > 104* 110* 106* 92* 96*   < > = values in this interval not displayed.   Basic Metabolic Panel: Recent Labs  Lab 12/02/19 0727 12/02/19 0727 12/03/19 0723 12/03/19 0723 12/04/19 0453 12/04/19 0453 12/05/19 0432 12/05/19 1827 12/05/19 2335 12/06/19 0925 12/07/19 0724  NA 137   < > 135   < > 133*   < > 139 136 139 135 136  K 4.6   < > 5.1   < > 4.8   < > 5.9* 6.5* 6.5* 6.0* 5.4*  CL 97*   < > 96*   < > 95*   < > 99 98 97* 96* 95*  CO2 29   < > 29   < > 28   < > 29 24 29 27 29   GLUCOSE 80   < > 66*   < > 103*   < > 79 113* 91 112* 73  BUN 33*   < > 44*   < > 34*   < > 48* 54* 55* 58* 38*  CREATININE 4.69*   < > 5.55*   < > 4.37*   < > 5.36* 5.95* 6.23* 6.50* 4.78*  CALCIUM 9.1   < > 8.9   < > 8.6*   < > 9.2 8.6* 9.3 9.0 8.6*  MG 1.9  --  1.9  --  1.8  --  2.0  --   --   --  1.8  PHOS 4.3  --  5.1*  --  4.7*  --  5.8*  --   --   --  5.2*   < > = values in this interval not displayed.   GFR: Estimated Creatinine Clearance: 21.5 mL/min (A) (by C-G formula based on SCr of 4.78 mg/dL (H)). Liver Function Tests: Recent Labs  Lab 12/02/19 0727 12/03/19 0723 12/04/19 0453 12/05/19 0432 12/07/19 0724  ALBUMIN 2.6* 2.5* 2.5* 2.6* 2.4*   No results for input(s): LIPASE, AMYLASE in the last 168 hours. No results for input(s): AMMONIA in the last 168 hours. Coagulation Profile: No results for input(s): INR, PROTIME in the last 168 hours. Cardiac Enzymes: No results for input(s): CKTOTAL, CKMB, CKMBINDEX, TROPONINI in the last 168 hours. BNP (last 3 results) No results for input(s): PROBNP in the last 8760 hours. HbA1C: No results for input(s): HGBA1C in the last 72 hours. CBG: No results for input(s): GLUCAP in the  last 168 hours. Lipid Profile: No results for input(s): CHOL, HDL, LDLCALC, TRIG, CHOLHDL, LDLDIRECT in the last 72 hours. Thyroid Function Tests: No results for input(s): TSH, T4TOTAL,  FREET4, T3FREE, THYROIDAB in the last 72 hours. Anemia Panel: No results for input(s): VITAMINB12, FOLATE, FERRITIN, TIBC, IRON, RETICCTPCT in the last 72 hours. Sepsis Labs: No results for input(s): PROCALCITON, LATICACIDVEN in the last 168 hours.  Recent Results (from the past 240 hour(s))  Resp Panel by RT PCR (RSV, Flu A&B, Covid) - Nasopharyngeal Swab     Status: None   Collection Time: 11/29/19  2:13 PM   Specimen: Nasopharyngeal Swab  Result Value Ref Range Status   SARS Coronavirus 2 by RT PCR NEGATIVE NEGATIVE Final    Comment: (NOTE) SARS-CoV-2 target nucleic acids are NOT DETECTED.  The SARS-CoV-2 RNA is generally detectable in upper respiratoy specimens during the acute phase of infection. The lowest concentration of SARS-CoV-2 viral copies this assay can detect is 131 copies/mL. A negative result does not preclude SARS-Cov-2 infection and should not be used as the sole basis for treatment or other patient management decisions. A negative result may occur with  improper specimen collection/handling, submission of specimen other than nasopharyngeal swab, presence of viral mutation(s) within the areas targeted by this assay, and inadequate number of viral copies (<131 copies/mL). A negative result must be combined with clinical observations, patient history, and epidemiological information. The expected result is Negative.  Fact Sheet for Patients:  PinkCheek.be  Fact Sheet for Healthcare Providers:  GravelBags.it  This test is no t yet approved or cleared by the [place] FDA and  has been authorized for detection and/or diagnosis of SARS-CoV-2 by FDA under an Emergency Use Authorization (EUA). This EUA will remain  in  effect (meaning this test can be used) for the duration of the COVID-19 declaration under Section 564(b)(1) of the Act, 21 U.S.C. section 360bbb-3(b)(1), unless the authorization is terminated or revoked sooner.     Influenza A by PCR NEGATIVE NEGATIVE Final   Influenza B by PCR NEGATIVE NEGATIVE Final    Comment: (NOTE) The Xpert Xpress SARS-CoV-2/FLU/RSV assay is intended as an aid in  the diagnosis of influenza from Nasopharyngeal swab specimens and  should not be used as a sole basis for treatment. Nasal washings and  aspirates are unacceptable for Xpert Xpress SARS-CoV-2/FLU/RSV  testing.  Fact Sheet for Patients: PinkCheek.be  Fact Sheet for Healthcare Providers: GravelBags.it  This test is not yet approved or cleared by the [place] FDA and  has been authorized for detection and/or diagnosis of SARS-CoV-2 by  FDA under an Emergency Use Authorization (EUA). This EUA will remain  in effect (meaning this test can be used) for the duration of the  Covid-19 declaration under Section 564(b)(1) of the Act, 21  U.S.C. section 360bbb-3(b)(1), unless the authorization is  terminated or revoked.    Respiratory Syncytial Virus by PCR NEGATIVE NEGATIVE Final    Comment: (NOTE) Fact Sheet for Patients: PinkCheek.be  Fact Sheet for Healthcare Providers: GravelBags.it  This test is not yet approved or cleared by the [place] FDA and  has been authorized for detection and/or diagnosis of SARS-CoV-2 by  FDA under an Emergency Use Authorization (EUA). This EUA will remain  in effect (meaning this test can be used) for the duration of the  COVID-19 declaration under Section 564(b)(1) of the Act, 21 U.S.C.  section 360bbb-3(b)(1), unless the authorization is terminated or  revoked. Performed at [facility], [address] [place], [place] [address]    Aerobic Culture (superficial specimen)     Status: None   Collection Time: 11/29/19  5:23 PM   Specimen:  Wound  Result Value Ref Range Status   Specimen Description   Final    WOUND Performed at [facility], [address] [place], [place] [address]    Special Requests   Final    RIGHT LEG Performed at [facility], [place], [place] [address]    Gram Stain   Final    FEW WBC PRESENT, PREDOMINANTLY PMN RARE GRAM POSITIVE COCCI Performed at [facility], [place] [address], [place], [place] [address]    Culture RARE PSEUDOMONAS AERUGINOSA  Final   Report Status 12/02/2019 FINAL  Final   Organism ID, Bacteria PSEUDOMONAS AERUGINOSA  Final      Susceptibility   Pseudomonas aeruginosa - MIC*    CEFTAZIDIME >=64 RESISTANT Resistant     CIPROFLOXACIN <=0.25 SENSITIVE Sensitive     GENTAMICIN <=1 SENSITIVE Sensitive     IMIPENEM 2 SENSITIVE Sensitive     CEFEPIME 16 RESISTANT Resistant     * RARE PSEUDOMONAS AERUGINOSA  Respiratory Panel by RT PCR (Flu A&B, Covid) - Nasopharyngeal Swab     Status: None   Collection Time: 12/05/19 10:04 PM   Specimen: Nasopharyngeal Swab  Result Value Ref Range Status   SARS Coronavirus 2 by RT PCR NEGATIVE NEGATIVE Final    Comment: (NOTE) SARS-CoV-2 target nucleic acids are NOT DETECTED.  The SARS-CoV-2 RNA is generally detectable in upper respiratoy specimens during the acute phase of infection. The lowest concentration of SARS-CoV-2 viral copies this assay can detect is 131 copies/mL. A negative result does not preclude SARS-Cov-2 infection and should not be used as the sole basis for treatment or other patient management decisions. A negative result may occur with  improper specimen collection/handling, submission of specimen other than nasopharyngeal swab, presence of viral mutation(s) within the areas targeted by this assay, and inadequate number of viral copies (<131 copies/mL). A negative result  must be combined with clinical observations, patient history, and epidemiological information. The expected result is Negative.  Fact Sheet for Patients:  PinkCheek.be  Fact Sheet for Healthcare Providers:  GravelBags.it  This test is no t yet approved or cleared by the [place] FDA and  has been authorized for detection and/or diagnosis of SARS-CoV-2 by FDA under an Emergency Use Authorization (EUA). This EUA will remain  in effect (meaning this test can be used) for the duration of the COVID-19 declaration under Section 564(b)(1) of the Act, 21 U.S.C. section 360bbb-3(b)(1), unless the authorization is terminated or revoked sooner.     Influenza A by PCR NEGATIVE NEGATIVE Final   Influenza B by PCR NEGATIVE NEGATIVE Final    Comment: (NOTE) The Xpert Xpress SARS-CoV-2/FLU/RSV assay is intended as an aid in  the diagnosis of influenza from Nasopharyngeal swab specimens and  should not be used as a sole basis for treatment. Nasal washings and  aspirates are unacceptable for Xpert Xpress SARS-CoV-2/FLU/RSV  testing.  Fact Sheet for Patients: PinkCheek.be  Fact Sheet for Healthcare Providers: GravelBags.it  This test is not yet approved or cleared by the [place] FDA and  has been authorized for detection and/or diagnosis of SARS-CoV-2 by  FDA under an Emergency Use Authorization (EUA). This EUA will remain  in effect (meaning this test can be used) for the duration of the  Covid-19 declaration under Section 564(b)(1) of the Act, 21  U.S.C. section 360bbb-3(b)(1), unless the authorization is  terminated or revoked. Performed at [facility], [address], [place], [place] [address]  Radiology Studies: No results found.      Scheduled Meds: . aspirin EC  81 mg Oral Daily  . collagenase   Topical Daily  . [START ON  12/08/2019] epoetin (EPOGEN/PROCRIT) injection  10,000 Units Intravenous Q T,Th,Sa-HD  . feeding supplement (NEPRO CARB STEADY)  237 mL Oral BID BM  . ferrous sulfate  325 mg Oral BID  . heparin  5,000 Units Subcutaneous Q8H  . midodrine  10 mg Oral TID WC  . multivitamin  1 tablet Oral QHS  . pantoprazole  40 mg Oral BID  . patiromer  16.8 g Oral Daily  . predniSONE  10 mg Oral Daily  . traZODone  50 mg Oral QHS   Continuous Infusions: . meropenem (MERREM) IV 500 mg (12/06/19 2351)     LOS: 1 day    Time spent: 25 minutes    Sidney Ace, MD [facility] Pager [phone]  If 7PM-7AM, please contact night-coverage 12/07/2019, 1:18 PM

## 2019-12-07 NOTE — Evaluation (Signed)
Physical Therapy Evaluation Patient Details Name: [name] MRN: [id number] DOB: Jan 09, 1971 Today's Date: 12/07/2019   History of Present Illness  Recently here and left AMA to try and get dialysis off site, unable and returns within a few hours. 2 weeks ago came in secondary to R LE swelling, pain; admitted for management of severe cellulitis of R LE.  Hospital course additionally significant for hypotension, requiring transfer to CCU and pressor support; episode of vtach with synchronized cardioversion (10/30), CRRT (via temp dialysis cath, left) 10/30-11/2.   Clinical Impression  Pt showed good effort and willingness to work with PT once convinced that now was the time to do it.  He showed good motivation with exercises and mobility but profound hip and quad weakness was a clearly limiter with both, additionally lack of DF in b/l ankles.  He did not have any LOBs or over unsteadiness during 200 ft of ambulation, but he was relying on knee (hyper) extension/resting on ligaments to keep from buckling t/o the effort, along with heavy UE reliance on the walker.  Apparently prior to recent hospitalization (~2 weeks ago) he was walking w/o AD and could get up from floor or low recliner, now he displays extremely poor quad control with not nearly enough strength to rise from anything close to standard height.  Despite being able to walk 200 ft, he was extremely inefficient and with profound LE weakness he clearly is unable/unsafe to try and return home from the hospital and will require further PT intervention building strength and function, recommending inpatient acute and pt on board with this idea.    Follow Up Recommendations  (inpatient acute)    Equipment Recommendations  Rolling walker with 5" wheels    Recommendations for Other Services       Precautions / Restrictions Precautions Precautions: Fall Restrictions Weight Bearing Restrictions: No      Mobility  Bed Mobility Overal  bed mobility: Needs Assistance Bed Mobility: Supine to Sit;Sit to Supine     Supine to sit: Min assist Sit to supine: Min assist   General bed mobility comments: Pt did not need excessive assist, however heavy use of UEs to move LEs in/out of bed    Transfers Overall transfer level: Needs assistance Equipment used: Rolling walker (2 wheeled) Transfers: Sit to/from Stand Sit to Stand: From elevated surface;Mod assist         General transfer comment: Pt with poor quad strength/control and was unable to give any initiatory effort in getting to standing even from elevated height.    Ambulation/Gait Ambulation/Gait assistance: +2 safety/equipment;Min assist Gait Distance (Feet): 200 Feet Assistive device: Rolling walker (2 wheeled)       General Gait Details: Once up pt was highly reliant on the walker, but was able to maintain slow but steady gait.  He clearly was reliant on using TKE/ligaments to maintain standing as quads were too weak to actively extend and b/l ankles lacked ROM to consitently clear toes.  Stairs            Wheelchair Mobility    Modified Rankin (Stroke Patients Only)       Balance Overall balance assessment: Needs assistance Sitting-balance support: No upper extremity supported;Feet supported Sitting balance-Leahy Scale: Good Sitting balance - Comments: no issues sitting EOB     Standing balance-Leahy Scale: Fair Standing balance comment: patient relying heavily on rolling walker for UE support in standing position, knees in static hyperextension to keep from buclkling  Pertinent Vitals/Pain Pain Assessment: No/denies pain Pain Score: 0-No pain Pain Location: bilateral foot pain     Home Living Family/patient expects to be discharged to:: Private residence Living Arrangements: Alone   Type of Home: House Home Access: Ramped entrance     Home Layout: One level Home Equipment: None       Prior Function Level of Independence: Independent         Comments: Per patient report, indep with ADLs, household and community mobilization without assist device; drives to/from dialysis MWF.  Patient questionable historian; will verify with family as available     Hand Dominance   Dominant Hand: Left    Extremity/Trunk Assessment   Upper Extremity Assessment Upper Extremity Assessment: Overall WFL for tasks assessed    Lower Extremity Assessment Lower Extremity Assessment: Generalized weakness (b/l quads very weak < 3/5, lacks DF to neutral b/l ankles)       Communication   Communication: No difficulties  Cognition Arousal/Alertness: Awake/alert Behavior During Therapy: WFL for tasks assessed/performed Overall Cognitive Status: Within Functional Limits for tasks assessed Area of Impairment: Attention;Memory;Following commands;Safety/judgement;Awareness;Problem solving                                      General Comments General comments (skin integrity, edema, etc.): Pt with b/l LE edema, ostensibly new onset profound weakness    Exercises General Exercises - Lower Extremity Ankle Circles/Pumps: AAROM;PROM;10 reps Quad Sets: Strengthening;10 reps Short Arc Quad: AAROM;AROM;10 reps Heel Slides: AROM;AAROM;10 reps (lightly resisted leg extensions, very weak quads b/l) Hip ABduction/ADduction: AROM;Strengthening;10 reps Straight Leg Raises:  (unable)   Assessment/Plan    PT Assessment Patient needs continued PT services  PT Problem List Decreased strength;Decreased range of motion;Decreased activity tolerance;Decreased balance;Decreased mobility;Decreased coordination;Decreased cognition;Decreased knowledge of use of DME;Decreased safety awareness;Decreased knowledge of precautions;Impaired sensation;Decreased skin integrity;Pain       PT Treatment Interventions Therapeutic exercise;DME instruction;Gait training;Stair training;Functional mobility  training;Therapeutic activities;Balance training;Cognitive remediation;Patient/family education;Neuromuscular re-education    PT Goals (Current goals can be found in the Care Plan section)  Acute Rehab PT Goals Patient Stated Goal: to go to rehab  PT Goal Formulation: With patient Time For Goal Achievement: 12/21/19 Potential to Achieve Goals: Fair    Frequency Min 2X/week   Barriers to discharge        Co-evaluation               AM-PAC PT "6 Clicks" Mobility  Outcome Measure Help needed turning from your back to your side while in a flat bed without using bedrails?: A Little Help needed moving from lying on your back to sitting on the side of a flat bed without using bedrails?: A Lot Help needed moving to and from a bed to a chair (including a wheelchair)?: A Lot Help needed standing up from a chair using your arms (e.g., wheelchair or bedside chair)?: A Lot Help needed to walk in hospital room?: A Little Help needed climbing 3-5 steps with a railing? : Total 6 Click Score: 13    End of Session Equipment Utilized During Treatment: Gait belt;Oxygen (4L, O2 remains in the mid/high 90s t/o ambulation) Activity Tolerance: Patient tolerated treatment well Patient left: in bed;with bed alarm set;with call bell/phone within reach   PT Visit Diagnosis: Muscle weakness (generalized) (M62.81);Difficulty in walking, not elsewhere classified (R26.2) Pain - Right/Left: Right Pain - part of body: Leg    Time:  2458-0998 PT Time Calculation (min) (ACUTE ONLY): 48 min   Charges:              Kreg Shropshire, DPT 12/07/2019, 1:29 PM

## 2019-12-07 NOTE — Progress Notes (Addendum)
[facility]  ROUNDING NOTE   Subjective:    Patient sitting up in bed, consuming breakfast. He denies SOB,nausea or vomiting. Received dialysis treatment yesterday.      Objective:  Vital signs in last 24 hours:  Temp:  [97.6 F (36.4 C)-98.4 F (36.9 C)] 98.2 F (36.8 C) (11/17 0739) Pulse Rate:  [67-85] 72 (11/17 0739) Resp:  [13-23] 16 (11/17 0739) BP: (87-136)/(40-65) 133/59 (11/17 0739) SpO2:  [97 %-100 %] 100 % (11/17 0739)  Weight change:  Filed Weights   12/05/19 1757  Weight: 95.3 kg    Intake/Output: No intake/output data recorded.   Intake/Output this shift:  No intake/output data recorded.  Physical Exam: General:  In no acute distress  Head:  Normocephalic,atraumatic  Eyes: Sclerae and conjunctivae clear  Lungs:   Respiratory effort normal and symmetrical, lungs clear  Heart:  S1S2, no rubs or gallops   Abdomen:  Soft, non tender,non distended  Extremities:  1+ peripheral edema  Neurologic:  Awake, alert, speech clear , answers simple questions appropriately  Skin: Rt leg with dressing clean,dry and intact  Access: Left AVG +bruit,+thrill    Basic Metabolic Panel: Recent Labs  Lab 12/02/19 0727 12/02/19 0727 12/03/19 0723 12/03/19 0723 12/04/19 0453 12/04/19 0453 12/05/19 0432 12/05/19 0432 12/05/19 1827 12/05/19 1827 12/05/19 2335 12/06/19 0925 12/07/19 0724  NA 137   < > 135   < > 133*   < > 139  --  136  --  139 135 136  K 4.6   < > 5.1   < > 4.8   < > 5.9*  --  6.5*  --  6.5* 6.0* 5.4*  CL 97*   < > 96*   < > 95*   < > 99  --  98  --  97* 96* 95*  CO2 29   < > 29   < > 28   < > 29  --  24  --  29 27 29   GLUCOSE 80   < > 66*   < > 103*   < > 79  --  113*  --  91 112* 73  BUN 33*   < > 44*   < > 34*   < > 48*  --  54*  --  55* 58* 38*  CREATININE 4.69*   < > 5.55*   < > 4.37*   < > 5.36*  --  5.95*  --  6.23* 6.50* 4.78*  CALCIUM 9.1   < > 8.9   < > 8.6*   < > 9.2   < > 8.6*   < > 9.3 9.0 8.6*  MG 1.9  --  1.9  --  1.8   --  2.0  --   --   --   --   --  1.8  PHOS 4.3  --  5.1*  --  4.7*  --  5.8*  --   --   --   --   --  5.2*   < > = values in this interval not displayed.    Liver Function Tests: Recent Labs  Lab 12/02/19 0727 12/03/19 0723 12/04/19 0453 12/05/19 0432 12/07/19 0724  ALBUMIN 2.6* 2.5* 2.5* 2.6* 2.4*   No results for input(s): LIPASE, AMYLASE in the last 168 hours. No results for input(s): AMMONIA in the last 168 hours.  CBC: Recent Labs  Lab 12/03/19 0723 12/03/19 0723 12/04/19 0453 12/05/19 0432 12/05/19 1827 12/05/19 2335 12/07/19 0623  WBC 12.5*   < > 10.8* 9.7 11.5* 10.7* 7.9  NEUTROABS 10.0*  --  8.8* 7.8* 10.2*  --  6.0  HGB 8.8*   < > 8.6* 8.6* 9.2* 9.0* 8.1*  HCT 28.0*   < > 27.8* 28.8* 28.4* 29.0* 25.7*  MCV 96.2   < > 97.9 98.0 93.7 96.7 95.5  PLT 101*   < > 104* 110* 106* 92* 96*   < > = values in this interval not displayed.    Cardiac Enzymes: No results for input(s): CKTOTAL, CKMB, CKMBINDEX, TROPONINI in the last 168 hours.  BNP: Invalid input(s): POCBNP  CBG: No results for input(s): GLUCAP in the last 168 hours.  Microbiology: Results for orders placed or performed during the hospital encounter of 12/05/19  Respiratory Panel by RT PCR (Flu A&B, Covid) - Nasopharyngeal Swab     Status: None   Collection Time: 12/05/19 10:04 PM   Specimen: Nasopharyngeal Swab  Result Value Ref Range Status   SARS Coronavirus 2 by RT PCR NEGATIVE NEGATIVE Final    Comment: (NOTE) SARS-CoV-2 target nucleic acids are NOT DETECTED.  The SARS-CoV-2 RNA is generally detectable in upper respiratoy specimens during the acute phase of infection. The lowest concentration of SARS-CoV-2 viral copies this assay can detect is 131 copies/mL. A negative result does not preclude SARS-Cov-2 infection and should not be used as the sole basis for treatment or other patient management decisions. A negative result may occur with  improper specimen collection/handling, submission  of specimen other than nasopharyngeal swab, presence of viral mutation(s) within the areas targeted by this assay, and inadequate number of viral copies (<131 copies/mL). A negative result must be combined with clinical observations, patient history, and epidemiological information. The expected result is Negative.  Fact Sheet for Patients:  PinkCheek.be  Fact Sheet for Healthcare Providers:  GravelBags.it  This test is no t yet approved or cleared by the [place] FDA and  has been authorized for detection and/or diagnosis of SARS-CoV-2 by FDA under an Emergency Use Authorization (EUA). This EUA will remain  in effect (meaning this test can be used) for the duration of the COVID-19 declaration under Section 564(b)(1) of the Act, 21 U.S.C. section 360bbb-3(b)(1), unless the authorization is terminated or revoked sooner.     Influenza A by PCR NEGATIVE NEGATIVE Final   Influenza B by PCR NEGATIVE NEGATIVE Final    Comment: (NOTE) The Xpert Xpress SARS-CoV-2/FLU/RSV assay is intended as an aid in  the diagnosis of influenza from Nasopharyngeal swab specimens and  should not be used as a sole basis for treatment. Nasal washings and  aspirates are unacceptable for Xpert Xpress SARS-CoV-2/FLU/RSV  testing.  Fact Sheet for Patients: PinkCheek.be  Fact Sheet for Healthcare Providers: GravelBags.it  This test is not yet approved or cleared by the [place] FDA and  has been authorized for detection and/or diagnosis of SARS-CoV-2 by  FDA under an Emergency Use Authorization (EUA). This EUA will remain  in effect (meaning this test can be used) for the duration of the  Covid-19 declaration under Section 564(b)(1) of the Act, 21  U.S.C. section 360bbb-3(b)(1), unless the authorization is  terminated or revoked. Performed at [facility], [address] [place], [place] [address]     Coagulation Studies: No results for input(s): LABPROT, INR in the last 72 hours.  Urinalysis: No results for input(s): COLORURINE, LABSPEC, PHURINE, GLUCOSEU, HGBUR, BILIRUBINUR, KETONESUR, PROTEINUR, UROBILINOGEN, NITRITE, LEUKOCYTESUR in the last 72 hours.  Invalid  input(s): APPERANCEUR    Imaging: No results found.   Medications:   . meropenem (MERREM) IV 500 mg (12/06/19 2351)   . aspirin EC  81 mg Oral Daily  . feeding supplement (NEPRO CARB STEADY)  237 mL Oral BID BM  . ferrous sulfate  325 mg Oral BID  . heparin  5,000 Units Subcutaneous Q8H  . midodrine  10 mg Oral TID WC  . multivitamin  1 tablet Oral QHS  . pantoprazole  40 mg Oral BID  . patiromer  16.8 g Oral Daily  . predniSONE  10 mg Oral Daily  . traZODone  50 mg Oral QHS   acetaminophen **OR** acetaminophen, loperamide, ondansetron **OR** ondansetron (ZOFRAN) IV, oxyCODONE, promethazine  Assessment/ Plan:  [name] is a 49 y.o. black male with end stage renal disease on hemodialysis, history of kidney transplant, lupus nephritis, and hypotension who [facility] for Hyperkalemia [E87.5] Cellulitis of right lower extremity [T46.568]  [facility] [place]/ Left AVG/TTS  # End stage renal disease.  Received dialysis treatment yesterday,was hypotensive during the treatment Unable to achieve goal UF of 1L Plan to give Midodrine before next dialysis sessions Volume and electrolyte status acceptable today No additional dialysis required  Will continue TTS schedule  #Anemia with chronic kidney disease.   Lab Results  Component Value Date   HGB 8.1 (L) 12/07/2019  Epogen 10,000 units with TTS with dialysis  Continue Ferrous Sulfate PO  #Secondary Hyperparathyroidism Lab Results  Component Value Date   CALCIUM 8.6 (L) 12/07/2019   PHOS 5.2 (H) 12/07/2019  Will continue monitoring bone mineral metabolism parameters  #Hyperkalemia S.  Potassium level down to 5.4 today, still above goal Continue Veltassa 16.8 gm daily Will continue monitoring   LOS: 1 Princy Raju 11/17/20219:15 AM  I saw and evaluated the patient and discussed the care with Crosby Oyster, DNP.  I agree with the findings and plan as documented in the note.    Gaelan Glennon Holley Raring , [facility] 11/17/20212:34 PM

## 2019-12-07 NOTE — Consult Note (Addendum)
WOC Nurse Consult Note: Consult was performed remotely after review of the progress notes and photos in the EMR. Pt is familiar to [facility] team from a previous admission, refer to consult note on 10/29.  Santyl was ordered at that time.  Wound type: Right leg with chronic full thickness wound is slowly improving from previous description. Wound bed: approx 40% yellow, 60% red and moist Periwound: intact skin surrounding Dressing procedure/placement/frequency: Topical treatment orders provided for bedside nurse to perform daily, as was the previous plan of care to provide enzymatic debridement of nonviable tissue. Apply Santyl to right leg wound Q day, then cover with moist gauze and foam dressing.  (Change foam dressing Q 3 days or PRN soiling.) Please re-consult if further assistance is needed.  Thank-you,  Julien Girt MSN, [place], [place], [place], [place]

## 2019-12-07 NOTE — Plan of Care (Signed)

## 2019-12-08 LAB — BASIC METABOLIC PANEL
Anion gap: 12 (ref 5–15)
BUN: 48 mg/dL — ABNORMAL HIGH (ref 6–20)
CO2: 29 mmol/L (ref 22–32)
Calcium: 8.7 mg/dL — ABNORMAL LOW (ref 8.9–10.3)
Chloride: 96 mmol/L — ABNORMAL LOW (ref 98–111)
Creatinine, Ser: 5.89 mg/dL — ABNORMAL HIGH (ref 0.61–1.24)
GFR, Estimated: 11 mL/min — ABNORMAL LOW (ref >60)
Glucose, Bld: 126 mg/dL — ABNORMAL HIGH (ref 70–99)
Potassium: 5.9 mmol/L — ABNORMAL HIGH (ref 3.5–5.1)
Sodium: 137 mmol/L (ref 135–145)

## 2019-12-08 LAB — CBC WITH DIFFERENTIAL/PLATELET
Abs Immature Granulocytes: 0.06 10*3/uL (ref 0.00–0.07)
Basophils Absolute: 0 10*3/uL (ref 0.0–0.1)
Basophils Relative: 0 %
Eosinophils Absolute: 0.1 10*3/uL (ref 0.0–0.5)
Eosinophils Relative: 2 %
HCT: 24 % — ABNORMAL LOW (ref 39.0–52.0)
Hemoglobin: 7.4 g/dL — ABNORMAL LOW (ref 13.0–17.0)
Immature Granulocytes: 1 %
Lymphocytes Relative: 15 %
Lymphs Abs: 1.1 10*3/uL (ref 0.7–4.0)
MCH: 29.7 pg (ref 26.0–34.0)
MCHC: 30.8 g/dL (ref 30.0–36.0)
MCV: 96.4 fL (ref 80.0–100.0)
Monocytes Absolute: 0.5 10*3/uL (ref 0.1–1.0)
Monocytes Relative: 7 %
Neutro Abs: 5.5 10*3/uL (ref 1.7–7.7)
Neutrophils Relative %: 75 %
Platelets: 92 10*3/uL — ABNORMAL LOW (ref 150–400)
RBC: 2.49 MIL/uL — ABNORMAL LOW (ref 4.22–5.81)
RDW: 18.2 % — ABNORMAL HIGH (ref 11.5–15.5)
WBC: 7.3 10*3/uL (ref 4.0–10.5)
nRBC: 0 % (ref 0.0–0.2)

## 2019-12-08 MED ORDER — ALBUMIN HUMAN 25 % IV SOLN
25.0000 g | INTRAVENOUS | Status: DC
  Administered 2019-12-08 – 2019-12-22 (×6): 25 g via INTRAVENOUS
  Filled 2019-12-08 (×7): qty 100

## 2019-12-08 MED ORDER — TAMSULOSIN HCL 0.4 MG PO CAPS
0.4000 mg | ORAL_CAPSULE | Freq: Once | ORAL | Status: AC
Start: 2019-12-08 — End: 2019-12-08
  Administered 2019-12-08: 0.4 mg via ORAL
  Filled 2019-12-08: qty 1

## 2019-12-08 NOTE — Progress Notes (Signed)
PROGRESS NOTE    [name]  VVO:[id number] DOB: [birth year] DOA: 12/05/2019 PCP: [facility]   Brief Narrative:  49 y.o. 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  He remains on IV meropenem.  No indication of sepsis or septic shock at this time.  Patient was only out of the hospital for a few hours.  Nephrology consulted.  Message sent to ID for recommendations.  Communicated with infectious disease.  Last day of antibiotics will be 12/09/2019.  Communicated this with patient.  He is understanding.  Plan to discharge to inpatient rehab facility once bed is found.  Although patient did leave AMA his previous admission this time he is motivated and actively seeking inpatient rehab facility.  I believe he is motivated to improve and recover and at this time risk of him leaving AMA is minimal.   Assessment & Plan:   Principal Problem:   Hyperkalemia Active Problems:   Cellulitis of right lower extremity   ESRD on hemodialysis (HCC)   Systemic lupus erythematosus, unspecified ([place])   History of Renal transplant failure   Pseudomonas aeruginosa wound infection  Hyperkalemia   ESRD on hemodialysis ([place]) with history of failed renal transplant -Patient received calcium gluconate and Veltassa in the emergency room Hemodialysis 12/06/2019 Patient has been refusing his Veltassa Plan: Continue Veltassa per nephrology recommendations Hyperkalemia improved HD today 12/08/2019    Pseudomonas aeruginosa wound  infection   Cellulitis of right lower extremity   Wound right lower leg Communicated with infectious disease End date of meropenem 12/09/2019, pharmacy aware Continue wound care per [facility] recommendations Plan for discharge to inpatient rehab sometime after last dose tomorrow     Systemic lupus erythematosus, unspecified ([place]) -Continue prednisone   DVT prophylaxis: Heparin subcutaneous Code Status: Full code Family Communication: None today Disposition Plan: Status is: Inpatient  Remains inpatient appropriate because:IV treatments appropriate due to intensity of illness or inability to take PO   Dispo: The patient is from: Home              Anticipated d/c is to: SNF              Anticipated d/c date is: 1 day              Patient currently is not medically stable to d/c. Marland Kitchen Last dose of meropenem will be 12/09/2019.  Case management looking into inpatient rehab options.  Consultants:   Nephrology  Procedures:   None  Antimicrobials:   Meropenem   Subjective: Seen and examined.  Cannot tolerate telemetry monitor.  No pain complaints.  Inquires about status of inpatient rehab referral.  Objective: Vitals:   12/08/19 1130 12/08/19 1145 12/08/19 1200 12/08/19 1215  BP: (!) 103/49 (!) 101/46 (!) 114/57 (!) 114/52  Pulse: 69 64 65 69  Resp: 20 20 (!) 24 15  Temp:      TempSrc:      SpO2:  100% 100% 100%  Weight:      Height:  Intake/Output Summary (Last 24 hours) at 12/08/2019 1302 Last data filed at 12/08/2019 0959 Gross per 24 hour  Intake 1250 ml  Output --  Net 1250 ml   Filed Weights   12/05/19 1757  Weight: 95.3 kg    Examination:  General exam: Appears calm and comfortable  Respiratory system: Clear to auscultation. Respiratory effort normal. Cardiovascular system: S1 & S2 heard, RRR. No JVD, murmurs, rubs, gallops or clicks. No pedal edema. Gastrointestinal system: Abdomen is nondistended, soft and nontender. No organomegaly or masses  felt. Normal bowel sounds heard. Central nervous system: Alert and oriented. No focal neurological deficits. Extremities: Symmetric 5 x 5 power.  LUE AVG with positive thrill Skin: Right shin wound, superficial Psychiatry: Judgement and insight appear normal. Mood & affect appropriate.     Data Reviewed: I have personally reviewed following labs and imaging studies  CBC: Recent Labs  Lab 12/04/19 0453 12/04/19 0453 12/05/19 0432 12/05/19 1827 12/05/19 2335 12/07/19 0724 12/08/19 1045  WBC 10.8*   < > 9.7 11.5* 10.7* 7.9 7.3  NEUTROABS 8.8*  --  7.8* 10.2*  --  6.0 5.5  HGB 8.6*   < > 8.6* 9.2* 9.0* 8.1* 7.4*  HCT 27.8*   < > 28.8* 28.4* 29.0* 25.7* 24.0*  MCV 97.9   < > 98.0 93.7 96.7 95.5 96.4  PLT 104*   < > 110* 106* 92* 96* 92*   < > = values in this interval not displayed.   Basic Metabolic Panel: Recent Labs  Lab 12/02/19 0727 12/02/19 0727 12/03/19 0723 12/03/19 0723 12/04/19 0453 12/04/19 0453 12/05/19 0432 12/05/19 0432 12/05/19 1827 12/05/19 2335 12/06/19 0925 12/07/19 0724 12/08/19 1045  NA 137   < > 135   < > 133*   < > 139   < > 136 139 135 136 137  K 4.6   < > 5.1   < > 4.8   < > 5.9*   < > 6.5* 6.5* 6.0* 5.4* 5.9*  CL 97*   < > 96*   < > 95*   < > 99   < > 98 97* 96* 95* 96*  CO2 29   < > 29   < > 28   < > 29   < > 24 29 27 29 29   GLUCOSE 80   < > 66*   < > 103*   < > 79   < > 113* 91 112* 73 126*  BUN 33*   < > 44*   < > 34*   < > 48*   < > 54* 55* 58* 38* 48*  CREATININE 4.69*   < > 5.55*   < > 4.37*   < > 5.36*   < > 5.95* 6.23* 6.50* 4.78* 5.89*  CALCIUM 9.1   < > 8.9   < > 8.6*   < > 9.2   < > 8.6* 9.3 9.0 8.6* 8.7*  MG 1.9  --  1.9  --  1.8  --  2.0  --   --   --   --  1.8  --   PHOS 4.3  --  5.1*  --  4.7*  --  5.8*  --   --   --   --  5.2*  --    < > = values in this interval not displayed.   GFR: Estimated Creatinine Clearance: 17.4 mL/min (A) (by C-G formula based on SCr of 5.89 mg/dL (H)). Liver Function Tests: Recent Labs  Lab  12/02/19 0727 12/03/19 0723 12/04/19 0453 12/05/19 0432 12/07/19 0724  ALBUMIN 2.6* 2.5* 2.5* 2.6* 2.4*   No results for input(s): LIPASE, AMYLASE in the last 168 hours. No results for input(s): AMMONIA in the last 168 hours. Coagulation Profile: No results for input(s): INR, PROTIME in the last 168 hours. Cardiac Enzymes: No results for input(s): CKTOTAL, CKMB, CKMBINDEX, TROPONINI in the last 168 hours. BNP (last 3 results) No results for input(s): PROBNP in the last 8760 hours. HbA1C: No results for input(s): HGBA1C in the last 72 hours. CBG: No results for input(s): GLUCAP in the last 168 hours. Lipid Profile: No results for input(s): CHOL, HDL, LDLCALC, TRIG, CHOLHDL, LDLDIRECT in the last 72 hours. Thyroid Function Tests: No results for input(s): TSH, T4TOTAL, FREET4, T3FREE, THYROIDAB in the last 72 hours. Anemia Panel: No results for input(s): VITAMINB12, FOLATE, FERRITIN, TIBC, IRON, RETICCTPCT in the last 72 hours. Sepsis Labs: No results for input(s): PROCALCITON, LATICACIDVEN in the last 168 hours.  Recent Results (from the past 240 hour(s))  Resp Panel by RT PCR (RSV, Flu A&B, Covid) - Nasopharyngeal Swab     Status: None   Collection Time: 11/29/19  2:13 PM   Specimen: Nasopharyngeal Swab  Result Value Ref Range Status   SARS Coronavirus 2 by RT PCR NEGATIVE NEGATIVE Final    Comment: (NOTE) SARS-CoV-2 target nucleic acids are NOT DETECTED.  The SARS-CoV-2 RNA is generally detectable in upper respiratoy specimens during the acute phase of infection. The lowest concentration of SARS-CoV-2 viral copies this assay can detect is 131 copies/mL. A negative result does not preclude SARS-Cov-2 infection and should not be used as the sole basis for treatment or other patient management decisions. A negative result may occur with  improper specimen collection/handling, submission of specimen other than nasopharyngeal swab, presence of viral mutation(s) within  the areas targeted by this assay, and inadequate number of viral copies (<131 copies/mL). A negative result must be combined with clinical observations, patient history, and epidemiological information. The expected result is Negative.  Fact Sheet for Patients:  PinkCheek.be  Fact Sheet for Healthcare Providers:  GravelBags.it  This test is no t yet approved or cleared by the [place] FDA and  has been authorized for detection and/or diagnosis of SARS-CoV-2 by FDA under an Emergency Use Authorization (EUA). This EUA will remain  in effect (meaning this test can be used) for the duration of the COVID-19 declaration under Section 564(b)(1) of the Act, 21 U.S.C. section 360bbb-3(b)(1), unless the authorization is terminated or revoked sooner.     Influenza A by PCR NEGATIVE NEGATIVE Final   Influenza B by PCR NEGATIVE NEGATIVE Final    Comment: (NOTE) The Xpert Xpress SARS-CoV-2/FLU/RSV assay is intended as an aid in  the diagnosis of influenza from Nasopharyngeal swab specimens and  should not be used as a sole basis for treatment. Nasal washings and  aspirates are unacceptable for Xpert Xpress SARS-CoV-2/FLU/RSV  testing.  Fact Sheet for Patients: PinkCheek.be  Fact Sheet for Healthcare Providers: GravelBags.it  This test is not yet approved or cleared by the [place] FDA and  has been authorized for detection and/or diagnosis of SARS-CoV-2 by  FDA under an Emergency Use Authorization (EUA). This EUA will remain  in effect (meaning this test can be used) for the duration of the  Covid-19 declaration under Section 564(b)(1) of the Act, 21  U.S.C. section 360bbb-3(b)(1), unless the authorization is  terminated or revoked.    Respiratory Syncytial Virus  by PCR NEGATIVE NEGATIVE Final    Comment: (NOTE) Fact Sheet for  Patients: PinkCheek.be  Fact Sheet for Healthcare Providers: GravelBags.it  This test is not yet approved or cleared by the [place] FDA and  has been authorized for detection and/or diagnosis of SARS-CoV-2 by  FDA under an Emergency Use Authorization (EUA). This EUA will remain  in effect (meaning this test can be used) for the duration of the  COVID-19 declaration under Section 564(b)(1) of the Act, 21 U.S.C.  section 360bbb-3(b)(1), unless the authorization is terminated or  revoked. Performed at [facility], [address], [place], [place] [address]   Aerobic Culture (superficial specimen)     Status: None   Collection Time: 11/29/19  5:23 PM   Specimen: Wound  Result Value Ref Range Status   Specimen Description   Final    WOUND Performed at [facility], [address], [place], [place] [address]    Special Requests   Final    RIGHT LEG Performed at [facility], [address] [place], [place] [address]    Gram Stain   Final    FEW WBC PRESENT, PREDOMINANTLY PMN RARE GRAM POSITIVE COCCI Performed at [facility], [place] [address] [place], [place] [address]    Culture RARE PSEUDOMONAS AERUGINOSA  Final   Report Status 12/02/2019 FINAL  Final   Organism ID, Bacteria PSEUDOMONAS AERUGINOSA  Final      Susceptibility   Pseudomonas aeruginosa - MIC*    CEFTAZIDIME >=64 RESISTANT Resistant     CIPROFLOXACIN <=0.25 SENSITIVE Sensitive     GENTAMICIN <=1 SENSITIVE Sensitive     IMIPENEM 2 SENSITIVE Sensitive     CEFEPIME 16 RESISTANT Resistant     * RARE PSEUDOMONAS AERUGINOSA  Respiratory Panel by RT PCR (Flu A&B, Covid) - Nasopharyngeal Swab     Status: None   Collection Time: 12/05/19 10:04 PM   Specimen: Nasopharyngeal Swab  Result Value Ref Range Status   SARS Coronavirus 2 by RT PCR NEGATIVE NEGATIVE Final    Comment: (NOTE) SARS-CoV-2 target nucleic acids are  NOT DETECTED.  The SARS-CoV-2 RNA is generally detectable in upper respiratoy specimens during the acute phase of infection. The lowest concentration of SARS-CoV-2 viral copies this assay can detect is 131 copies/mL. A negative result does not preclude SARS-Cov-2 infection and should not be used as the sole basis for treatment or other patient management decisions. A negative result may occur with  improper specimen collection/handling, submission of specimen other than nasopharyngeal swab, presence of viral mutation(s) within the areas targeted by this assay, and inadequate number of viral copies (<131 copies/mL). A negative result must be combined with clinical observations, patient history, and epidemiological information. The expected result is Negative.  Fact Sheet for Patients:  PinkCheek.be  Fact Sheet for Healthcare Providers:  GravelBags.it  This test is no t yet approved or cleared by the [place] FDA and  has been authorized for detection and/or diagnosis of SARS-CoV-2 by FDA under an Emergency Use Authorization (EUA). This EUA will remain  in effect (meaning this test can be used) for the duration of the COVID-19 declaration under Section 564(b)(1) of the Act, 21 U.S.C. section 360bbb-3(b)(1), unless the authorization is terminated or revoked sooner.     Influenza A by PCR NEGATIVE NEGATIVE Final   Influenza B by PCR NEGATIVE NEGATIVE Final    Comment: (NOTE) The Xpert Xpress SARS-CoV-2/FLU/RSV assay is intended as an aid in  the  diagnosis of influenza from Nasopharyngeal swab specimens and  should not be used as a sole basis for treatment. Nasal washings and  aspirates are unacceptable for Xpert Xpress SARS-CoV-2/FLU/RSV  testing.  Fact Sheet for Patients: PinkCheek.be  Fact Sheet for Healthcare Providers: GravelBags.it  This test is not yet  approved or cleared by the [place] FDA and  has been authorized for detection and/or diagnosis of SARS-CoV-2 by  FDA under an Emergency Use Authorization (EUA). This EUA will remain  in effect (meaning this test can be used) for the duration of the  Covid-19 declaration under Section 564(b)(1) of the Act, 21  U.S.C. section 360bbb-3(b)(1), unless the authorization is  terminated or revoked. Performed at [facility], [address], [place], [place] [address]          Radiology Studies: No results found.      Scheduled Meds: . aspirin EC  81 mg Oral Daily  . collagenase   Topical Daily  . epoetin (EPOGEN/PROCRIT) injection  10,000 Units Intravenous Q T,Th,Sa-HD  . feeding supplement (NEPRO CARB STEADY)  237 mL Oral BID BM  . ferrous sulfate  325 mg Oral BID  . heparin  5,000 Units Subcutaneous Q8H  . midodrine  10 mg Oral TID WC  . multivitamin  1 tablet Oral QHS  . pantoprazole  40 mg Oral BID  . patiromer  16.8 g Oral Daily  . predniSONE  10 mg Oral Daily  . traZODone  50 mg Oral QHS   Continuous Infusions: . albumin human 25 g (12/08/19 1155)  . meropenem (MERREM) IV 500 mg (12/08/19 0003)     LOS: 2 days    Time spent: 15 minutes    Sidney Ace, MD [facility] Pager [phone]  If 7PM-7AM, please contact night-coverage 12/08/2019, 1:02 PM

## 2019-12-08 NOTE — Progress Notes (Signed)
[facility]  ROUNDING NOTE   Subjective:    Patient awake, alert, denies SOB, nausea or vomiting. We are planning for dialysis treatment today as per his regular schedule.Orders placed.      Objective:  Vital signs in last 24 hours:  Temp:  [97.4 F (36.3 C)-98.2 F (36.8 C)] 98.1 F (36.7 C) (11/18 0723) Pulse Rate:  [60-74] 62 (11/18 0723) Resp:  [14-17] 17 (11/18 0723) BP: (107-127)/(49-57) 120/57 (11/18 0723) SpO2:  [100 %] 100 % (11/18 0723)  Weight change:  Filed Weights   12/05/19 1757  Weight: 95.3 kg    Intake/Output: I/O last 3 completed shifts: In: 1050 [P.O.:360; IV Piggyback:690] Out: -    Intake/Output this shift:  Total I/O In: 440 [P.O.:440] Out: -   Physical Exam: General:  Sitting up in bed, in no acute distress  Head:  Oral mucous membranes moist  Eyes: Anicteric  Lungs:    Lungs clear, normal and symmetrical respiratory effort  Heart:  Regular rate and rhythm   Abdomen:  Soft, non tender,non distended  Extremities:  1+ peripheral edema  Neurologic: Oriented  Skin: Rt leg with dressing clean,dry and intact  Access: Left AVG +bruit,+thrill    Basic Metabolic Panel: Recent Labs  Lab 12/02/19 0727 12/02/19 0727 12/03/19 0723 12/03/19 0723 12/04/19 0453 12/04/19 0453 12/05/19 0432 12/05/19 0432 12/05/19 1827 12/05/19 1827 12/05/19 2335 12/05/19 2335 12/06/19 0925 12/07/19 0724 12/08/19 1045  NA 137   < > 135   < > 133*   < > 139   < > 136  --  139  --  135 136 137  K 4.6   < > 5.1   < > 4.8   < > 5.9*   < > 6.5*  --  6.5*  --  6.0* 5.4* 5.9*  CL 97*   < > 96*   < > 95*   < > 99   < > 98  --  97*  --  96* 95* 96*  CO2 29   < > 29   < > 28   < > 29   < > 24  --  29  --  27 29 29   GLUCOSE 80   < > 66*   < > 103*   < > 79   < > 113*  --  91  --  112* 73 126*  BUN 33*   < > 44*   < > 34*   < > 48*   < > 54*  --  55*  --  58* 38* 48*  CREATININE 4.69*   < > 5.55*   < > 4.37*   < > 5.36*   < > 5.95*  --  6.23*  --  6.50*  4.78* 5.89*  CALCIUM 9.1   < > 8.9   < > 8.6*   < > 9.2   < > 8.6*   < > 9.3   < > 9.0 8.6* 8.7*  MG 1.9  --  1.9  --  1.8  --  2.0  --   --   --   --   --   --  1.8  --   PHOS 4.3  --  5.1*  --  4.7*  --  5.8*  --   --   --   --   --   --  5.2*  --    < > = values in this interval not displayed.  Liver Function Tests: Recent Labs  Lab 12/02/19 0727 12/03/19 0723 12/04/19 0453 12/05/19 0432 12/07/19 0724  ALBUMIN 2.6* 2.5* 2.5* 2.6* 2.4*   No results for input(s): LIPASE, AMYLASE in the last 168 hours. No results for input(s): AMMONIA in the last 168 hours.  CBC: Recent Labs  Lab 12/03/19 0723 12/03/19 0723 12/04/19 0453 12/05/19 0432 12/05/19 1827 12/05/19 2335 12/07/19 0724  WBC 12.5*   < > 10.8* 9.7 11.5* 10.7* 7.9  NEUTROABS 10.0*  --  8.8* 7.8* 10.2*  --  6.0  HGB 8.8*   < > 8.6* 8.6* 9.2* 9.0* 8.1*  HCT 28.0*   < > 27.8* 28.8* 28.4* 29.0* 25.7*  MCV 96.2   < > 97.9 98.0 93.7 96.7 95.5  PLT 101*   < > 104* 110* 106* 92* 96*   < > = values in this interval not displayed.    Cardiac Enzymes: No results for input(s): CKTOTAL, CKMB, CKMBINDEX, TROPONINI in the last 168 hours.  BNP: Invalid input(s): POCBNP  CBG: No results for input(s): GLUCAP in the last 168 hours.  Microbiology: Results for orders placed or performed during the hospital encounter of 12/05/19  Respiratory Panel by RT PCR (Flu A&B, Covid) - Nasopharyngeal Swab     Status: None   Collection Time: 12/05/19 10:04 PM   Specimen: Nasopharyngeal Swab  Result Value Ref Range Status   SARS Coronavirus 2 by RT PCR NEGATIVE NEGATIVE Final    Comment: (NOTE) SARS-CoV-2 target nucleic acids are NOT DETECTED.  The SARS-CoV-2 RNA is generally detectable in upper respiratoy specimens during the acute phase of infection. The lowest concentration of SARS-CoV-2 viral copies this assay can detect is 131 copies/mL. A negative result does not preclude SARS-Cov-2 infection and should not be used as the sole  basis for treatment or other patient management decisions. A negative result may occur with  improper specimen collection/handling, submission of specimen other than nasopharyngeal swab, presence of viral mutation(s) within the areas targeted by this assay, and inadequate number of viral copies (<131 copies/mL). A negative result must be combined with clinical observations, patient history, and epidemiological information. The expected result is Negative.  Fact Sheet for Patients:  PinkCheek.be  Fact Sheet for Healthcare Providers:  GravelBags.it  This test is no t yet approved or cleared by the [place] FDA and  has been authorized for detection and/or diagnosis of SARS-CoV-2 by FDA under an Emergency Use Authorization (EUA). This EUA will remain  in effect (meaning this test can be used) for the duration of the COVID-19 declaration under Section 564(b)(1) of the Act, 21 U.S.C. section 360bbb-3(b)(1), unless the authorization is terminated or revoked sooner.     Influenza A by PCR NEGATIVE NEGATIVE Final   Influenza B by PCR NEGATIVE NEGATIVE Final    Comment: (NOTE) The Xpert Xpress SARS-CoV-2/FLU/RSV assay is intended as an aid in  the diagnosis of influenza from Nasopharyngeal swab specimens and  should not be used as a sole basis for treatment. Nasal washings and  aspirates are unacceptable for Xpert Xpress SARS-CoV-2/FLU/RSV  testing.  Fact Sheet for Patients: PinkCheek.be  Fact Sheet for Healthcare Providers: GravelBags.it  This test is not yet approved or cleared by the [place] FDA and  has been authorized for detection and/or diagnosis of SARS-CoV-2 by  FDA under an Emergency Use Authorization (EUA). This EUA will remain  in effect (meaning this test can be used) for the duration of the  Covid-19 declaration under Section 564(b)(1) of the  Act, 21  U.S.C. section 360bbb-3(b)(1), unless the authorization is  terminated or revoked. Performed at [facility], [address] [place], [place] [address]     Coagulation Studies: No results for input(s): LABPROT, INR in the last 72 hours.  Urinalysis: No results for input(s): COLORURINE, LABSPEC, PHURINE, GLUCOSEU, HGBUR, BILIRUBINUR, KETONESUR, PROTEINUR, UROBILINOGEN, NITRITE, LEUKOCYTESUR in the last 72 hours.  Invalid input(s): APPERANCEUR    Imaging: No results found.   Medications:   . albumin human    . meropenem (MERREM) IV 500 mg (12/08/19 0003)   . aspirin EC  81 mg Oral Daily  . collagenase   Topical Daily  . epoetin (EPOGEN/PROCRIT) injection  10,000 Units Intravenous Q T,Th,Sa-HD  . feeding supplement (NEPRO CARB STEADY)  237 mL Oral BID BM  . ferrous sulfate  325 mg Oral BID  . heparin  5,000 Units Subcutaneous Q8H  . midodrine  10 mg Oral TID WC  . multivitamin  1 tablet Oral QHS  . pantoprazole  40 mg Oral BID  . patiromer  16.8 g Oral Daily  . predniSONE  10 mg Oral Daily  . traZODone  50 mg Oral QHS   acetaminophen **OR** acetaminophen, calcium carbonate, loperamide, ondansetron **OR** ondansetron (ZOFRAN) IV, oxyCODONE, promethazine  Assessment/ Plan:  [name] is a 49 y.o. black male with end stage renal disease on hemodialysis, history of kidney transplant, lupus nephritis, and hypotension who was admitted to [facility] on 12/05/2019 for Hyperkalemia [E87.5] Cellulitis of right lower extremity [J03.009]  [facility] [place]/ Left AVG/TTS  # End stage renal disease.  Planning for dialysis treatment today with a goal UF of 2L Midodrine prior to dialysis as gets hypotensive during treatments Will continue TTS schedule  #Anemia with chronic kidney disease.   Lab Results  Component Value Date   HGB 8.1 (L) 12/07/2019  Will continue PO Iron supplements and Epogen with dialysis   #Secondary Hyperparathyroidism Lab  Results  Component Value Date   CALCIUM 8.7 (L) 12/08/2019   PHOS 5.2 (H) 12/07/2019  Will continue monitoring  #Hyperkalemia Potassium elevated to 5.9 Will use 2K bath for dialysis today   LOS: 2 Lena Fieldhouse 11/18/202111:05 AM  I

## 2019-12-08 NOTE — TOC Progression Note (Signed)
Transition of Care [facility]) - Progression Note    Patient Details  Name: [name] MRN: [id number] Date of Birth: [birth year]  Transition of Care [facility]) CM/SW [place], LCSW Phone Number: 12/08/2019, 1:07 PM  Clinical Narrative:   Received a return call from April with [facility]. She reported Inpatient Rehab facility will have to get approval from Mirian Capuchin at the [place] [phone] ext [phone]) after they accept patient. She was unsure which Inpatient Rehab facilities are in network with the [facility]. She provided patient's [facility] Social Worker's information- Abundio Miu [phone] ext. [phone]). CSW called and left a voicemail for [employer] and asked for list of Inpatient Rehab facilities.   Patient told MD today he will agree to go to Inpatient Rehab, but not SNF rehab. PT recommending Inpatient Rehab.   RNCM Ginnie reported they were working on Acute Inpatient Rehab in [facility] on last admission. CSW left voicemail for their Admissions Worker Pam and requested a return call.     Expected Discharge Plan: [place] Barriers to Discharge: Continued Medical Work up  Expected Discharge Plan and Services Expected Discharge Plan: [place] In-house Referral: Clinical Social Work Discharge Planning Services: CM Consult Post Acute Care Choice: [place] arrangements for the past 2 months: Single Family Home                 DME Arranged: N/A DME Agency: NA         [place] Agency: [place] ([facility])         Social Determinants of Health (SDOH) Interventions    Readmission Risk Interventions Readmission Risk Prevention Plan 12/06/2019 11/24/2019  Transportation Screening Complete Complete  PCP or Specialist Appt within 3-5 Days - Complete  HRI or [place] Complete Complete  Social Work Consult for [place] Planning/Counseling Complete Complete  Palliative Care Screening Not  Applicable Not Applicable  Medication Review [employer]) - Referral to Pharmacy  Some recent data might be hidden

## 2019-12-08 NOTE — Progress Notes (Signed)
Patient took off Cardiac monitor and is refusing at this time

## 2019-12-09 NOTE — Progress Notes (Signed)
Pharmacy Antibiotic Note  [name] is a 49 y.o. male admitted on 12/05/2019 with Wound infection with Pseudomonas.  Pharmacy has been consulted for Meropenem dosing. CrCl = 17.3 ml/min , on HD MWF  Plan: Meropenem 500 mg IV Q24H . Per previous discussion with Dr. Priscella Mann, patient will end treatment today.   Height: 6\' 2"  (188 cm) Weight: 95.3 kg (210 lb) IBW/kg (Calculated) : 82.2  Temp (24hrs), Avg:98.1 F (36.7 C), Min:97.8 F (36.6 C), Max:98.4 F (36.9 C)  Recent Labs  Lab 12/05/19 0432 12/05/19 0432 12/05/19 1827 12/05/19 2335 12/06/19 0925 12/07/19 0724 12/08/19 1045  WBC 9.7  --  11.5* 10.7*  --  7.9 7.3  CREATININE 5.36*   < > 5.95* 6.23* 6.50* 4.78* 5.89*   < > = values in this interval not displayed.    Estimated Creatinine Clearance: 17.4 mL/min (A) (by C-G formula based on SCr of 5.89 mg/dL (H)).    Allergies  Allergen Reactions  . Ciprofloxacin Rash    skin on hands peeled off "in sheets."  . Sulfa Antibiotics Rash    Thank you for allowing pharmacy to be a part of this patient's care.  Rowland Lathe 12/09/2019 8:19 AM

## 2019-12-09 NOTE — Progress Notes (Signed)
Pt says we are coming into his room too much and we need to stop.  He says he does not want to be bothered anymore tonight.  He will call me when he wakes up in the morning so I can get his vitals.

## 2019-12-09 NOTE — Progress Notes (Signed)
Brief Pharmacy Note  Patient has been refusing SubQ heparin 5000 u Q8h for DVT ppx. This pharmacist called and spoke with patient and attempted to educate pt on DVT ppx while in the hospital. Patient was abrupt and stated he "doesn't want to risk anything with getting heparin" and understands the purpose of DVT ppx and risk of refusing DVT ppx.   Thank you for allowing pharmacy to be a part of this patient's care.  Kristeen Miss, PharmD Clinical Pharmacist

## 2019-12-09 NOTE — Progress Notes (Addendum)
[facility]  ROUNDING NOTE   Subjective:    Patient resting in bed, reports feeling better ,after dialysis treatment yesterday,able to achieve UF of 2.5L yesterday with dialysis.       Objective:  Vital signs in last 24 hours:  Temp:  [98 F (36.7 C)-98.4 F (36.9 C)] 98.1 F (36.7 C) (11/19 0746) Pulse Rate:  [39-75] 75 (11/19 0746) Resp:  [14-22] 17 (11/19 0746) BP: (88-120)/(34-58) 95/52 (11/19 0746) SpO2:  [97 %-100 %] 100 % (11/19 0746)  Weight change:  Filed Weights   12/05/19 1757  Weight: 95.3 kg    Intake/Output: I/O last 3 completed shifts: In: 1490 [P.O.:800; IV Piggyback:690] Out: 2500 [Other:2500]   Intake/Output this shift:  Total I/O In: 480 [P.O.:480] Out: -   Physical Exam: General:  In no acute distress  Head:  Normocephalic,atraumatic  Eyes: Sclerae and conjunctivae clear  Lungs:   Normal and symmetrical respiratory effort,Lungs clear  Heart:  S1S2, no rubs or gallops  Abdomen:  Soft, non tender,non distended  Extremities:  1+ peripheral edema  Neurologic: Awake, alert,speech clear and appropriate  Skin: Rt leg with dressing on mid calf area  Access: Left AVG +bruit,+thrill    Basic Metabolic Panel: Recent Labs  Lab 12/03/19 0723 12/03/19 0723 12/04/19 0453 12/04/19 0453 12/05/19 0432 12/05/19 0432 12/05/19 1827 12/05/19 1827 12/05/19 2335 12/05/19 2335 12/06/19 0925 12/07/19 0724 12/08/19 1045  NA 135   < > 133*   < > 139   < > 136  --  139  --  135 136 137  K 5.1   < > 4.8   < > 5.9*   < > 6.5*  --  6.5*  --  6.0* 5.4* 5.9*  CL 96*   < > 95*   < > 99   < > 98  --  97*  --  96* 95* 96*  CO2 29   < > 28   < > 29   < > 24  --  29  --  27 29 29   GLUCOSE 66*   < > 103*   < > 79   < > 113*  --  91  --  112* 73 126*  BUN 44*   < > 34*   < > 48*   < > 54*  --  55*  --  58* 38* 48*  CREATININE 5.55*   < > 4.37*   < > 5.36*   < > 5.95*  --  6.23*  --  6.50* 4.78* 5.89*  CALCIUM 8.9   < > 8.6*   < > 9.2   < > 8.6*   <  > 9.3   < > 9.0 8.6* 8.7*  MG 1.9  --  1.8  --  2.0  --   --   --   --   --   --  1.8  --   PHOS 5.1*  --  4.7*  --  5.8*  --   --   --   --   --   --  5.2*  --    < > = values in this interval not displayed.    Liver Function Tests: Recent Labs  Lab 12/03/19 0723 12/04/19 0453 12/05/19 0432 12/07/19 0724  ALBUMIN 2.5* 2.5* 2.6* 2.4*   No results for input(s): LIPASE, AMYLASE in the last 168 hours. No results for input(s): AMMONIA in the last 168 hours.  CBC: Recent Labs  Lab 12/04/19  2505 12/04/19 0453 12/05/19 0432 12/05/19 1827 12/05/19 2335 12/07/19 0724 12/08/19 1045  WBC 10.8*   < > 9.7 11.5* 10.7* 7.9 7.3  NEUTROABS 8.8*  --  7.8* 10.2*  --  6.0 5.5  HGB 8.6*   < > 8.6* 9.2* 9.0* 8.1* 7.4*  HCT 27.8*   < > 28.8* 28.4* 29.0* 25.7* 24.0*  MCV 97.9   < > 98.0 93.7 96.7 95.5 96.4  PLT 104*   < > 110* 106* 92* 96* 92*   < > = values in this interval not displayed.    Cardiac Enzymes: No results for input(s): CKTOTAL, CKMB, CKMBINDEX, TROPONINI in the last 168 hours.  BNP: Invalid input(s): POCBNP  CBG: No results for input(s): GLUCAP in the last 168 hours.  Microbiology: Results for orders placed or performed during the hospital encounter of 12/05/19  Respiratory Panel by RT PCR (Flu A&B, Covid) - Nasopharyngeal Swab     Status: None   Collection Time: 12/05/19 10:04 PM   Specimen: Nasopharyngeal Swab  Result Value Ref Range Status   SARS Coronavirus 2 by RT PCR NEGATIVE NEGATIVE Final    Comment: (NOTE) SARS-CoV-2 target nucleic acids are NOT DETECTED.  The SARS-CoV-2 RNA is generally detectable in upper respiratoy specimens during the acute phase of infection. The lowest concentration of SARS-CoV-2 viral copies this assay can detect is 131 copies/mL. A negative result does not preclude SARS-Cov-2 infection and should not be used as the sole basis for treatment or other patient management decisions. A negative result may occur with  improper specimen  collection/handling, submission of specimen other than nasopharyngeal swab, presence of viral mutation(s) within the areas targeted by this assay, and inadequate number of viral copies (<131 copies/mL). A negative result must be combined with clinical observations, patient history, and epidemiological information. The expected result is Negative.  Fact Sheet for Patients:  PinkCheek.be  Fact Sheet for Healthcare Providers:  GravelBags.it  This test is no t yet approved or cleared by the [place] FDA and  has been authorized for detection and/or diagnosis of SARS-CoV-2 by FDA under an Emergency Use Authorization (EUA). This EUA will remain  in effect (meaning this test can be used) for the duration of the COVID-19 declaration under Section 564(b)(1) of the Act, 21 U.S.C. section 360bbb-3(b)(1), unless the authorization is terminated or revoked sooner.     Influenza A by PCR NEGATIVE NEGATIVE Final   Influenza B by PCR NEGATIVE NEGATIVE Final    Comment: (NOTE) The Xpert Xpress SARS-CoV-2/FLU/RSV assay is intended as an aid in  the diagnosis of influenza from Nasopharyngeal swab specimens and  should not be used as a sole basis for treatment. Nasal washings and  aspirates are unacceptable for Xpert Xpress SARS-CoV-2/FLU/RSV  testing.  Fact Sheet for Patients: PinkCheek.be  Fact Sheet for Healthcare Providers: GravelBags.it  This test is not yet approved or cleared by the [place] FDA and  has been authorized for detection and/or diagnosis of SARS-CoV-2 by  FDA under an Emergency Use Authorization (EUA). This EUA will remain  in effect (meaning this test can be used) for the duration of the  Covid-19 declaration under Section 564(b)(1) of the Act, 21  U.S.C. section 360bbb-3(b)(1), unless the authorization is  terminated or revoked. Performed at [facility], [address] [place], [place] [address]     Coagulation Studies: No results for input(s): LABPROT, INR in the last 72 hours.  Urinalysis: No results for input(s): COLORURINE, LABSPEC, [place], [place],  HGBUR, BILIRUBINUR, KETONESUR, PROTEINUR, UROBILINOGEN, NITRITE, LEUKOCYTESUR in the last 72 hours.  Invalid input(s): APPERANCEUR    Imaging: No results found.   Medications:   . albumin human 25 g (12/08/19 1155)   . aspirin EC  81 mg Oral Daily  . collagenase   Topical Daily  . epoetin (EPOGEN/PROCRIT) injection  10,000 Units Intravenous Q T,Th,Sa-HD  . feeding supplement (NEPRO CARB STEADY)  237 mL Oral BID BM  . ferrous sulfate  325 mg Oral BID  . heparin  5,000 Units Subcutaneous Q8H  . midodrine  10 mg Oral TID WC  . multivitamin  1 tablet Oral QHS  . pantoprazole  40 mg Oral BID  . patiromer  16.8 g Oral Daily  . predniSONE  10 mg Oral Daily  . traZODone  50 mg Oral QHS   acetaminophen **OR** acetaminophen, calcium carbonate, loperamide, ondansetron **OR** ondansetron (ZOFRAN) IV, oxyCODONE, promethazine  Assessment/ Plan:  [name] is a 49 y.o. black male with end stage renal disease on hemodialysis, history of kidney transplant, lupus nephritis, and hypotension who was admitted to [facility] on 12/05/2019 for Hyperkalemia [E87.5] Cellulitis of right lower extremity [B26.203]  [facility] [place]/ Left AVG/TTS  # End stage renal disease.  Dialysis yesterday Patient tolerated the treatment well, no hypotensive episodes reported No additional dialysis required today Will continue TTS schedule  #Anemia with chronic kidney disease.   Lab Results  Component Value Date   HGB 7.4 (L) 12/08/2019  Epogen with dialysis  Patient is also on ferrous Sulfate PO  #Secondary Hyperparathyroidism Lab Results  Component Value Date   CALCIUM 8.7 (L) 12/08/2019   PHOS 5.2 (H) 12/07/2019  Will continue monitoring bone mineral metabolism  parameters  #Hyperkalemia Potassium elevated to 5.9 yesterday  Dialyzed with low Potassium dialysis solution Will continue monitoring    LOS: 3 [name] 11/19/202112:16 PM  I saw and evaluated the patient and discussed the care with Crosby Oyster, DNP.  I agree with the findings and plan as documented in the note.  Will plan for HD tomorrow.   Elaf Clauson Holley Raring , [facility] 11/19/20213:00 PM

## 2019-12-09 NOTE — Progress Notes (Signed)
PROGRESS NOTE    [name]  WER:[id number] DOB: Jul 03, 1970 DOA: 12/05/2019 PCP: [facility]   Brief Narrative:  49 y.o. 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  He remains on IV meropenem.  No indication of sepsis or septic shock at this time.  Patient was only out of the hospital for a few hours.  Nephrology consulted.  Message sent to ID for recommendations.  Communicated with infectious disease.  Last day of antibiotics will be 12/09/2019.  Communicated this with patient.  He is understanding.  Plan to discharge to inpatient rehab facility once bed is found.  Although patient did leave AMA his previous admission this time he is motivated and actively seeking inpatient rehab facility.  I believe he is motivated to improve and recover and at this time risk of him leaving AMA is minimal.  11/19: Last day of antibiotics today.  Had a long discussion with patient about disposition plan.  He continues to express his desire to go to inpatient rehab.  From my perspective he is motivated and wishes to improve.  My clinical impression inpatient rehab would be the best environment which he can continue to recover.  I believe he is motivated and not a high risk of leaving [place] despite his actions during this hospitalization   Assessment & Plan:   Principal Problem:   Hyperkalemia Active Problems:   Cellulitis of right lower extremity   ESRD on hemodialysis (HCC)   Systemic lupus erythematosus,  unspecified ([place])   History of Renal transplant failure   Pseudomonas aeruginosa wound infection  Hyperkalemia   ESRD on hemodialysis ([place]) with history of failed renal transplant -Patient received calcium gluconate and Veltassa in the emergency room Hemodialysis 12/06/2019 Patient has been refusing his [place]: Continue Veltassa per nephrology recommendations Hyperkalemia improved HD  12/08/2019    Pseudomonas aeruginosa wound infection   Cellulitis of right lower extremity   Wound right lower leg Communicated with infectious disease Last dose of meropenem today 12/09/2019 Continue wound care per [facility] recommendations     Systemic lupus erythematosus, unspecified ([place]) -Continue prednisone   DVT prophylaxis: Heparin subcutaneous Code Status: Full code Family Communication: None today Disposition Plan: Status is: Inpatient  Remains inpatient appropriate because:IV treatments appropriate due to intensity of illness or inability to take PO   Dispo: The patient is from: Home              Anticipated d/c is to: SNF              Anticipated d/c date is: 1 day              Patient currently is not medically stable to d/c. Marland Kitchen Last dose of meropenem today.  TOC team working on disposition inpatient rehab.  Consultants:   Nephrology  Procedures:   None  Antimicrobials:   Meropenem, last dose 12/08/2019   Subjective: Seen and examined.  Feeling better today.  No pain complaints.  Again inquire about status of inpatient rehab referral  Objective:  Vitals:   12/08/19 2346 12/09/19 0343 12/09/19 0746 12/09/19 1243  BP: (!) 100/58 (!) 120/55 (!) 95/52 104/61  Pulse: 71 73 75 77  Resp: 16 20 17 16   Temp: 98.2 F (36.8 C) 98.1 F (36.7 C) 98.1 F (36.7 C) 98.1 F (36.7 C)  TempSrc:  Oral Oral Oral  SpO2: 100% 97% 100% 98%  Weight:      Height:        Intake/Output Summary (Last 24 hours) at 12/09/2019 1410 Last data filed at 12/09/2019 1022 Gross per  24 hour  Intake 840 ml  Output 0 ml  Net 840 ml   Filed Weights   12/05/19 1757  Weight: 95.3 kg    Examination:  General exam: Appears calm and comfortable  Respiratory system: Clear to auscultation. Respiratory effort normal. Cardiovascular system: S1 & S2 heard, RRR. No JVD, murmurs, rubs, gallops or clicks. No pedal edema. Gastrointestinal system: Abdomen is nondistended, soft and nontender. No organomegaly or masses felt. Normal bowel sounds heard. Central nervous system: Alert and oriented. No focal neurological deficits. Extremities: Symmetric 5 x 5 power.  LUE AVG with positive thrill Skin: Right shin wound, superficial Psychiatry: Judgement and insight appear normal. Mood & affect appropriate.     Data Reviewed: I have personally reviewed following labs and imaging studies  CBC: Recent Labs  Lab 12/04/19 0453 12/04/19 0453 12/05/19 0432 12/05/19 1827 12/05/19 2335 12/07/19 0724 12/08/19 1045  WBC 10.8*   < > 9.7 11.5* 10.7* 7.9 7.3  NEUTROABS 8.8*  --  7.8* 10.2*  --  6.0 5.5  HGB 8.6*   < > 8.6* 9.2* 9.0* 8.1* 7.4*  HCT 27.8*   < > 28.8* 28.4* 29.0* 25.7* 24.0*  MCV 97.9   < > 98.0 93.7 96.7 95.5 96.4  PLT 104*   < > 110* 106* 92* 96* 92*   < > = values in this interval not displayed.   Basic Metabolic Panel: Recent Labs  Lab 12/03/19 0723 12/03/19 0723 12/04/19 0453 12/04/19 0453 12/05/19 0432 12/05/19 0432 12/05/19 1827 12/05/19 2335 12/06/19 0925 12/07/19 0724 12/08/19 1045  NA 135   < > 133*   < > 139   < > 136 139 135 136 137  K 5.1   < > 4.8   < > 5.9*   < > 6.5* 6.5* 6.0* 5.4* 5.9*  CL 96*   < > 95*   < > 99   < > 98 97* 96* 95* 96*  CO2 29   < > 28   < > 29   < > 24 29 27 29 29   GLUCOSE 66*   < > 103*   < > 79   < > 113* 91 112* 73 126*  BUN 44*   < > 34*   < > 48*   < > 54* 55* 58* 38* 48*  CREATININE 5.55*   < > 4.37*   < > 5.36*   < > 5.95* 6.23* 6.50* 4.78* 5.89*  CALCIUM 8.9   < > 8.6*   < > 9.2   < > 8.6* 9.3 9.0 8.6* 8.7*  MG  1.9  --  1.8  --  2.0  --   --   --   --  1.8  --   PHOS 5.1*  --  4.7*  --  5.8*  --   --   --   --  5.2*  --    < > = values in this  interval not displayed.   GFR: Estimated Creatinine Clearance: 17.4 mL/min (A) (by C-G formula based on SCr of 5.89 mg/dL (H)). Liver Function Tests: Recent Labs  Lab 12/03/19 0723 12/04/19 0453 12/05/19 0432 12/07/19 0724  ALBUMIN 2.5* 2.5* 2.6* 2.4*   No results for input(s): LIPASE, AMYLASE in the last 168 hours. No results for input(s): AMMONIA in the last 168 hours. Coagulation Profile: No results for input(s): INR, PROTIME in the last 168 hours. Cardiac Enzymes: No results for input(s): CKTOTAL, CKMB, CKMBINDEX, TROPONINI in the last 168 hours. BNP (last 3 results) No results for input(s): PROBNP in the last 8760 hours. HbA1C: No results for input(s): HGBA1C in the last 72 hours. CBG: No results for input(s): GLUCAP in the last 168 hours. Lipid Profile: No results for input(s): CHOL, HDL, LDLCALC, TRIG, CHOLHDL, LDLDIRECT in the last 72 hours. Thyroid Function Tests: No results for input(s): TSH, T4TOTAL, FREET4, T3FREE, THYROIDAB in the last 72 hours. Anemia Panel: No results for input(s): VITAMINB12, FOLATE, FERRITIN, TIBC, IRON, RETICCTPCT in the last 72 hours. Sepsis Labs: No results for input(s): PROCALCITON, LATICACIDVEN in the last 168 hours.  Recent Results (from the past 240 hour(s))  Resp Panel by RT PCR (RSV, Flu A&B, Covid) - Nasopharyngeal Swab     Status: None   Collection Time: 11/29/19  2:13 PM   Specimen: Nasopharyngeal Swab  Result Value Ref Range Status   SARS Coronavirus 2 by RT PCR NEGATIVE NEGATIVE Final    Comment: (NOTE) SARS-CoV-2 target nucleic acids are NOT DETECTED.  The SARS-CoV-2 RNA is generally detectable in upper respiratoy specimens during the acute phase of infection. The lowest concentration of SARS-CoV-2 viral copies this assay can detect is 131 copies/mL. A negative result does not preclude  SARS-Cov-2 infection and should not be used as the sole basis for treatment or other patient management decisions. A negative result may occur with  improper specimen collection/handling, submission of specimen other than nasopharyngeal swab, presence of viral mutation(s) within the areas targeted by this assay, and inadequate number of viral copies (<131 copies/mL). A negative result must be combined with clinical observations, patient history, and epidemiological information. The expected result is Negative.  Fact Sheet for Patients:  PinkCheek.be  Fact Sheet for Healthcare Providers:  GravelBags.it  This test is no t yet approved or cleared by the [place] FDA and  has been authorized for detection and/or diagnosis of SARS-CoV-2 by FDA under an Emergency Use Authorization (EUA). This EUA will remain  in effect (meaning this test can be used) for the duration of the COVID-19 declaration under Section 564(b)(1) of the Act, 21 U.S.C. section 360bbb-3(b)(1), unless the authorization is terminated or revoked sooner.     Influenza A by PCR NEGATIVE NEGATIVE Final   Influenza B by PCR NEGATIVE NEGATIVE Final    Comment: (NOTE) The Xpert Xpress SARS-CoV-2/FLU/RSV assay is intended as an aid in  the diagnosis of influenza from Nasopharyngeal swab specimens and  should not be used as a sole basis for treatment. Nasal washings and  aspirates are unacceptable for Xpert Xpress SARS-CoV-2/FLU/RSV  testing.  Fact Sheet for Patients: PinkCheek.be  Fact Sheet for Healthcare Providers: GravelBags.it  This test is not yet approved or cleared by the [place] FDA and  has been authorized for detection and/or diagnosis of SARS-CoV-2 by  FDA under an Emergency Use Authorization (EUA). This EUA will remain  in effect (meaning this test can be used) for the duration of the   Covid-19 declaration  under Section 564(b)(1) of the Act, 21  U.S.C. section 360bbb-3(b)(1), unless the authorization is  terminated or revoked.    Respiratory Syncytial Virus by PCR NEGATIVE NEGATIVE Final    Comment: (NOTE) Fact Sheet for Patients: PinkCheek.be  Fact Sheet for Healthcare Providers: GravelBags.it  This test is not yet approved or cleared by the [place] FDA and  has been authorized for detection and/or diagnosis of SARS-CoV-2 by  FDA under an Emergency Use Authorization (EUA). This EUA will remain  in effect (meaning this test can be used) for the duration of the  COVID-19 declaration under Section 564(b)(1) of the Act, 21 U.S.C.  section 360bbb-3(b)(1), unless the authorization is terminated or  revoked. Performed at [facility], [address], [place], [place] [address]   Aerobic Culture (superficial specimen)     Status: None   Collection Time: 11/29/19  5:23 PM   Specimen: Wound  Result Value Ref Range Status   Specimen Description   Final    WOUND Performed at [facility], [address], [place], [place] [address]    Special Requests   Final    RIGHT LEG Performed at [facility], [place] [place], [place] [address]    Gram Stain   Final    FEW WBC PRESENT, PREDOMINANTLY PMN RARE GRAM POSITIVE COCCI Performed at [facility], [place] [address] [place], [place] [address]    Culture RARE PSEUDOMONAS AERUGINOSA  Final   Report Status 12/02/2019 FINAL  Final   Organism ID, Bacteria PSEUDOMONAS AERUGINOSA  Final      Susceptibility   Pseudomonas aeruginosa - MIC*    CEFTAZIDIME >=64 RESISTANT Resistant     CIPROFLOXACIN <=0.25 SENSITIVE Sensitive     GENTAMICIN <=1 SENSITIVE Sensitive     IMIPENEM 2 SENSITIVE Sensitive     CEFEPIME 16 RESISTANT Resistant     * RARE PSEUDOMONAS AERUGINOSA  Respiratory Panel by RT PCR (Flu A&B, Covid) -  Nasopharyngeal Swab     Status: None   Collection Time: 12/05/19 10:04 PM   Specimen: Nasopharyngeal Swab  Result Value Ref Range Status   SARS Coronavirus 2 by RT PCR NEGATIVE NEGATIVE Final    Comment: (NOTE) SARS-CoV-2 target nucleic acids are NOT DETECTED.  The SARS-CoV-2 RNA is generally detectable in upper respiratoy specimens during the acute phase of infection. The lowest concentration of SARS-CoV-2 viral copies this assay can detect is 131 copies/mL. A negative result does not preclude SARS-Cov-2 infection and should not be used as the sole basis for treatment or other patient management decisions. A negative result may occur with  improper specimen collection/handling, submission of specimen other than nasopharyngeal swab, presence of viral mutation(s) within the areas targeted by this assay, and inadequate number of viral copies (<131 copies/mL). A negative result must be combined with clinical observations, patient history, and epidemiological information. The expected result is Negative.  Fact Sheet for Patients:  PinkCheek.be  Fact Sheet for Healthcare Providers:  GravelBags.it  This test is no t yet approved or cleared by the [place] FDA and  has been authorized for detection and/or diagnosis of SARS-CoV-2 by FDA under an Emergency Use Authorization (EUA). This EUA will remain  in effect (meaning this test can be used) for the duration of the COVID-19 declaration under Section 564(b)(1) of the Act, 21 U.S.C. section 360bbb-3(b)(1), unless the authorization is terminated or revoked sooner.     Influenza A by PCR NEGATIVE NEGATIVE Final   Influenza  B by PCR NEGATIVE NEGATIVE Final    Comment: (NOTE) The Xpert Xpress SARS-CoV-2/FLU/RSV assay is intended as an aid in  the diagnosis of influenza from Nasopharyngeal swab specimens and  should not be used as a sole basis for treatment. Nasal washings and   aspirates are unacceptable for Xpert Xpress SARS-CoV-2/FLU/RSV  testing.  Fact Sheet for Patients: PinkCheek.be  Fact Sheet for Healthcare Providers: GravelBags.it  This test is not yet approved or cleared by the [place] FDA and  has been authorized for detection and/or diagnosis of SARS-CoV-2 by  FDA under an Emergency Use Authorization (EUA). This EUA will remain  in effect (meaning this test can be used) for the duration of the  Covid-19 declaration under Section 564(b)(1) of the Act, 21  U.S.C. section 360bbb-3(b)(1), unless the authorization is  terminated or revoked. Performed at [facility], [address] [place], [place] [address]          Radiology Studies: No results found.      Scheduled Meds: . aspirin EC  81 mg Oral Daily  . collagenase   Topical Daily  . epoetin (EPOGEN/PROCRIT) injection  10,000 Units Intravenous Q T,Th,Sa-HD  . feeding supplement (NEPRO CARB STEADY)  237 mL Oral BID BM  . ferrous sulfate  325 mg Oral BID  . heparin  5,000 Units Subcutaneous Q8H  . midodrine  10 mg Oral TID WC  . multivitamin  1 tablet Oral QHS  . pantoprazole  40 mg Oral BID  . patiromer  16.8 g Oral Daily  . predniSONE  10 mg Oral Daily  . traZODone  50 mg Oral QHS   Continuous Infusions: . albumin human 25 g (12/08/19 1155)     LOS: 3 days    Time spent: 15 minutes    Sidney Ace, MD [facility] Pager [phone]  If 7PM-7AM, please contact night-coverage 12/09/2019, 2:10 PM

## 2019-12-09 NOTE — TOC Progression Note (Addendum)
Transition of Care [facility]) - Progression Note    Patient Details  Name: DJIBRIL GLOGOWSKI MRN: [id number] Date of Birth: 1970/06/28  Transition of [facility] CM/SW [place], LCSW Phone Number: 12/09/2019, 1:22 PM  Clinical Narrative:   Call from Acute Inpatient Rehab in [place]. She reported she was under the impression that patient left AMA. Informed her that he did but is now back and wants to go to Inpatient Rehab. She reported she will confirm with their MD, but from her understanding they will not be able to accept patient since he left AMA. She said she will call me if they decide they would consider taking patient.  CSW attempted another call to [place] [phone] ext. [phone]) to get list of other Inpatient Rehab options. Per Marquita's voicemail she is out, calls directed to [place] (ext. [phone]) and [facility] (ext. [phone]). CSW left voicemails for [place] requesting a return call with in [place].  Updated patient. He verbalized understanding and reported he still wants Inpatient Rehab. He said "as soon as you find out just let me know, I'm not going anywhere."    1:50- Call from [place]. She reported their in [place] are the [facility] through [place] and [facility] through [facility]. She reported they are not in network with [facility] Inpatient Rehab. She reported she will call CSW back if she finds out there are any others in network. [place] Ar confirmed once an Inpatient Rehab has accepted patient, they will have to get VA approval.  [facility] [phone], ask for [facility] Admissions), left a voicemail for Admissions Worker Neoma Laming requesting a return call.   Called [facility] [phone]), spoke to Admissions Worker [place]. Faxed referral to [phone]. Stanton Kidney reported one of their Liaisons will review the referral and call CSW back with a yes or no  answer.     Expected Discharge Plan: [place] Barriers to Discharge: Continued Medical Work up  Expected Discharge Plan and Services Expected Discharge Plan: [place] In-house Referral: Clinical Social Work Discharge Planning Services: CM Consult Post Acute Care Choice: [place] arrangements for the past 2 months: Single Family Home                 DME Arranged: N/A DME Agency: NA         [place] Agency: [place] [facility])         Social Determinants of Health (SDOH) Interventions    Readmission Risk Interventions Readmission Risk Prevention Plan 12/06/2019 11/24/2019  Transportation Screening Complete Complete  PCP or Specialist Appt within 3-5 Days - Complete  HRI or [place] Complete Complete  Social Work Consult for [place] Planning/Counseling Complete Complete  Palliative Care Screening Not Applicable Not Applicable  Medication Review Press photographer) - Referral to Pharmacy  Some recent data might be hidden

## 2019-12-10 LAB — PHOSPHORUS: Phosphorus: 6 mg/dL — ABNORMAL HIGH (ref 2.5–4.6)

## 2019-12-10 LAB — BASIC METABOLIC PANEL
Anion gap: 11 (ref 5–15)
BUN: 46 mg/dL — ABNORMAL HIGH (ref 6–20)
CO2: 27 mmol/L (ref 22–32)
Calcium: 8.7 mg/dL — ABNORMAL LOW (ref 8.9–10.3)
Chloride: 94 mmol/L — ABNORMAL LOW (ref 98–111)
Creatinine, Ser: 5.72 mg/dL — ABNORMAL HIGH (ref 0.61–1.24)
GFR, Estimated: 11 mL/min — ABNORMAL LOW (ref >60)
Glucose, Bld: 89 mg/dL (ref 70–99)
Potassium: 5.9 mmol/L — ABNORMAL HIGH (ref 3.5–5.1)
Sodium: 132 mmol/L — ABNORMAL LOW (ref 135–145)

## 2019-12-10 NOTE — Progress Notes (Signed)
PROGRESS NOTE    [name]  CWC:[id number] DOB: 1971-01-14 DOA: 12/05/2019 PCP: [facility]   Brief Narrative:  49 y.o. 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  He remains on IV meropenem.  No indication of sepsis or septic shock at this time.  Patient was only out of the hospital for a few hours.  Nephrology consulted.  Message sent to ID for recommendations.  Communicated with infectious disease.  Last day of antibiotics will be 12/09/2019.  Communicated this with patient.  He is understanding.  Plan to discharge to inpatient rehab facility once bed is found.  Although patient did leave AMA his previous admission this time he is motivated and actively seeking inpatient rehab facility.  I believe he is motivated to improve and recover and at this time risk of him leaving AMA is minimal.  11/19: Last day of antibiotics.  Had a long discussion with patient about disposition plan.  He continues to express his desire to go to inpatient rehab.  From my perspective he is motivated and wishes to improve.    In My clinical impression inpatient rehab would be the best environment which he can continue to recover.  I believe he is motivated and not a high risk of leaving [facility] despite his actions during this hospitalization.  He has been adherent to care and working physical therapy and every opportunity.   Assessment & Plan:   Principal Problem:   Hyperkalemia Active Problems:   Cellulitis of  right lower extremity   ESRD on hemodialysis (HCC)   Systemic lupus erythematosus, unspecified ([place])   History of Renal transplant failure   Pseudomonas aeruginosa wound infection  Hyperkalemia   ESRD on hemodialysis ([place]) with history of failed renal transplant -Patient received calcium gluconate and Veltassa in the emergency room Hemodialysis 12/06/2019 HD  12/08/2019 Plan: Continue Veltassa per nephrology recommendations Hyperkalemia improved HD 11/20.  Labs to be drawn during HD     Pseudomonas aeruginosa wound infection   Cellulitis of right lower extremity   Wound right lower leg Communicated with infectious disease Completed meropenem, last dose 12/09/2019 Continue wound care per [facility] recommendations     Systemic lupus erythematosus, unspecified ([place]) -Continue prednisone   DVT prophylaxis: Heparin subcutaneous Code Status: Full code Family Communication: None today Disposition Plan: Status is: Inpatient  Remains inpatient appropriate because:Unsafe d/c plan   Dispo: The patient is from: Home              Anticipated d/c is to: Inpatient rehab              Anticipated d/c date is: 1 day              Patient currently is medically stable to d/c.   Patient has completed his course of meropenem in house.  No further need for IV antibiotics.  He will continue dialysis in house until we have a safe disposition plan.   Consultants:   Nephrology  Procedures:   None  Antimicrobials:   Meropenem,  last dose 12/08/2019   Subjective: Seen and examined.  Frustrated about being woken up at night for vital signs checks.  No pain complaints.  Otherwise asymptomatic  Objective: Vitals:   12/09/19 1946 12/09/19 2321 12/10/19 0638 12/10/19 0808  BP: (!) 123/51 (!) 106/55 (!) 97/52 (!) 98/55  Pulse: 62 66 61 62  Resp: 17 18 19    Temp: 98 F (36.7 C) 98.1 F (36.7 C) 98.2 F (36.8 C)   TempSrc: Oral Oral    SpO2: 96% 100% 100% 98%  Weight:      Height:         Intake/Output Summary (Last 24 hours) at 12/10/2019 1113 Last data filed at 12/10/2019 0211 Gross per 24 hour  Intake --  Output 0 ml  Net 0 ml   Filed Weights   12/05/19 1757  Weight: 95.3 kg    Examination:  General exam: Appears calm and comfortable  Respiratory system: Clear to auscultation. Respiratory effort normal. Cardiovascular system: S1 & S2 heard, RRR. No JVD, murmurs, rubs, gallops or clicks. No pedal edema. Gastrointestinal system: Abdomen is nondistended, soft and nontender. No organomegaly or masses felt. Normal bowel sounds heard. Central nervous system: Alert and oriented. No focal neurological deficits. Extremities: Symmetric 5 x 5 power.  LUE AVG with positive thrill Skin: Right shin wound, superficial Psychiatry: Judgement and insight appear normal. Mood & affect appropriate.     Data Reviewed: I have personally reviewed following labs and imaging studies  CBC: Recent Labs  Lab 12/04/19 0453 12/04/19 0453 12/05/19 0432 12/05/19 1827 12/05/19 2335 12/07/19 0724 12/08/19 1045  WBC 10.8*   < > 9.7 11.5* 10.7* 7.9 7.3  NEUTROABS 8.8*  --  7.8* 10.2*  --  6.0 5.5  HGB 8.6*   < > 8.6* 9.2* 9.0* 8.1* 7.4*  HCT 27.8*   < > 28.8* 28.4* 29.0* 25.7* 24.0*  MCV 97.9   < > 98.0 93.7 96.7 95.5 96.4  PLT 104*   < > 110* 106* 92* 96* 92*   < > = values in this interval not displayed.   Basic Metabolic Panel: Recent Labs  Lab 12/04/19 0453 12/04/19 0453 12/05/19 0432 12/05/19 0432 12/05/19 1827 12/05/19 2335 12/06/19 0925 12/07/19 0724 12/08/19 1045  NA 133*   < > 139   < > 136 139 135 136 137  K 4.8   < > 5.9*   < > 6.5* 6.5* 6.0* 5.4* 5.9*  CL 95*   < > 99   < > 98 97* 96* 95* 96*  CO2 28   < > 29   < > 24 29 27 29 29   GLUCOSE 103*   < > 79   < > 113* 91 112* 73 126*  BUN 34*   < > 48*   < > 54* 55* 58* 38* 48*  CREATININE 4.37*   < > 5.36*   < > 5.95* 6.23* 6.50* 4.78* 5.89*  CALCIUM 8.6*   < > 9.2   < > 8.6* 9.3 9.0 8.6* 8.7*  MG 1.8  --   2.0  --   --   --   --  1.8  --   PHOS 4.7*  --  5.8*  --   --   --   --  5.2*  --    < > = values in this interval not displayed.   GFR: Estimated Creatinine Clearance: 17.4 mL/min (A) (by C-G formula based on SCr of 5.89 mg/dL (H)). Liver Function Tests:  Recent Labs  Lab 12/04/19 0453 12/05/19 0432 12/07/19 0724  ALBUMIN 2.5* 2.6* 2.4*   No results for input(s): LIPASE, AMYLASE in the last 168 hours. No results for input(s): AMMONIA in the last 168 hours. Coagulation Profile: No results for input(s): INR, PROTIME in the last 168 hours. Cardiac Enzymes: No results for input(s): CKTOTAL, CKMB, CKMBINDEX, TROPONINI in the last 168 hours. BNP (last 3 results) No results for input(s): PROBNP in the last 8760 hours. HbA1C: No results for input(s): HGBA1C in the last 72 hours. CBG: No results for input(s): GLUCAP in the last 168 hours. Lipid Profile: No results for input(s): CHOL, HDL, LDLCALC, TRIG, CHOLHDL, LDLDIRECT in the last 72 hours. Thyroid Function Tests: No results for input(s): TSH, T4TOTAL, FREET4, T3FREE, THYROIDAB in the last 72 hours. Anemia Panel: No results for input(s): VITAMINB12, FOLATE, FERRITIN, TIBC, IRON, RETICCTPCT in the last 72 hours. Sepsis Labs: No results for input(s): PROCALCITON, LATICACIDVEN in the last 168 hours.  Recent Results (from the past 240 hour(s))  Respiratory Panel by RT PCR (Flu A&B, Covid) - Nasopharyngeal Swab     Status: None   Collection Time: 12/05/19 10:04 PM   Specimen: Nasopharyngeal Swab  Result Value Ref Range Status   SARS Coronavirus 2 by RT PCR NEGATIVE NEGATIVE Final    Comment: (NOTE) SARS-CoV-2 target nucleic acids are NOT DETECTED.  The SARS-CoV-2 RNA is generally detectable in upper respiratoy specimens during the acute phase of infection. The lowest concentration of SARS-CoV-2 viral copies this assay can detect is 131 copies/mL. A negative result does not preclude SARS-Cov-2 infection and should not be used as  the sole basis for treatment or other patient management decisions. A negative result may occur with  improper specimen collection/handling, submission of specimen other than nasopharyngeal swab, presence of viral mutation(s) within the areas targeted by this assay, and inadequate number of viral copies (<131 copies/mL). A negative result must be combined with clinical observations, patient history, and epidemiological information. The expected result is Negative.  Fact Sheet for Patients:  PinkCheek.be  Fact Sheet for Healthcare Providers:  GravelBags.it  This test is no t yet approved or cleared by the [place] FDA and  has been authorized for detection and/or diagnosis of SARS-CoV-2 by FDA under an Emergency Use Authorization (EUA). This EUA will remain  in effect (meaning this test can be used) for the duration of the COVID-19 declaration under Section 564(b)(1) of the Act, 21 U.S.C. section 360bbb-3(b)(1), unless the authorization is terminated or revoked sooner.     Influenza A by PCR NEGATIVE NEGATIVE Final   Influenza B by PCR NEGATIVE NEGATIVE Final    Comment: (NOTE) The Xpert Xpress SARS-CoV-2/FLU/RSV assay is intended as an aid in  the diagnosis of influenza from Nasopharyngeal swab specimens and  should not be used as a sole basis for treatment. Nasal washings and  aspirates are unacceptable for Xpert Xpress SARS-CoV-2/FLU/RSV  testing.  Fact Sheet for Patients: PinkCheek.be  Fact Sheet for Healthcare Providers: GravelBags.it  This test is not yet approved or cleared by the [place] FDA and  has been authorized for detection and/or diagnosis of SARS-CoV-2 by  FDA under an Emergency Use Authorization (EUA). This EUA will remain  in effect (meaning this test can be used) for the duration of the  Covid-19 declaration under Section 564(b)(1)  of the Act, 21  U.S.C. section 360bbb-3(b)(1), unless the authorization is  terminated or revoked. Performed at [facility], [place], [place]  [place]          Radiology Studies: No results found.      Scheduled Meds: . aspirin EC  81 mg Oral Daily  . collagenase   Topical Daily  . epoetin (EPOGEN/PROCRIT) injection  10,000 Units Intravenous Q T,Th,Sa-HD  . feeding supplement (NEPRO CARB STEADY)  237 mL Oral BID BM  . ferrous sulfate  325 mg Oral BID  . heparin  5,000 Units Subcutaneous Q8H  . midodrine  10 mg Oral TID WC  . multivitamin  1 tablet Oral QHS  . pantoprazole  40 mg Oral BID  . patiromer  16.8 g Oral Daily  . predniSONE  10 mg Oral Daily  . traZODone  50 mg Oral QHS   Continuous Infusions: . albumin human 25 g (12/08/19 1155)     LOS: 4 days    Time spent: 15 minutes    Sidney Ace, MD [facility] Pager [phone]  If 7PM-7AM, please contact night-coverage 12/10/2019, 11:13 AM

## 2019-12-10 NOTE — Progress Notes (Signed)
Pt has refused some of meds. Telemetry and CPAP last night, MD was informed.

## 2019-12-10 NOTE — Progress Notes (Signed)
[name]  MRN: [id number]  DOB/AGE: May 26, 1970 49 y.o.  Primary Care Physician:[facility]  Admit date: 12/05/2019  Chief Complaint:  Chief Complaint  Patient presents with  . Wound Infection  . dialysis    S-Pt presented on  12/05/2019 with  Chief Complaint  Patient presents with  . Wound Infection  . dialysis  . Pateint seen on HD Patient tolerating the hd well.   Medications . aspirin EC  81 mg Oral Daily  . collagenase   Topical Daily  . epoetin (EPOGEN/PROCRIT) injection  10,000 Units Intravenous Q T,Th,Sa-HD  . feeding supplement (NEPRO CARB STEADY)  237 mL Oral BID BM  . ferrous sulfate  325 mg Oral BID  . heparin  5,000 Units Subcutaneous Q8H  . midodrine  10 mg Oral TID WC  . multivitamin  1 tablet Oral QHS  . pantoprazole  40 mg Oral BID  . patiromer  16.8 g Oral Daily  . predniSONE  10 mg Oral Daily  . traZODone  50 mg Oral QHS         PTW:SFKCL from the symptoms mentioned above,there are no other symptoms referable to all systems reviewed.  Physical Exam: Vital signs in last 24 hours: Temp:  [98 F (36.7 C)-98.2 F (36.8 C)] 98.2 F (36.8 C) (11/20 2751) Pulse Rate:  [61-77] 62 (11/20 0808) Resp:  [16-19] 19 (11/20 0638) BP: (97-123)/(51-65) 98/55 (11/20 0808) SpO2:  [96 %-100 %] 98 % (11/20 0808) Weight change:  Last BM Date: 12/07/19  Intake/Output from previous day: 11/19 0701 - 11/20 0700 In: 480 [P.O.:480] Out: 0  No intake/output data recorded.   Physical Exam: General- pt is awake,alert, oriented to time place and person  Resp- No acute REsp distress, CTA B/L NO Rhonchi  CVS- S1S2 regular in rate and rhythm  GIT- BS+, soft, Non tender , Non distended  EXT- NO LE Edema, Cyanosis  Access- RUE  AVG  -two needles in situ   Lab Results:  CBC  Recent Labs    12/08/19 1045  WBC 7.3  HGB 7.4*  HCT 24.0*  PLT 92*    BMET  Recent Labs    12/08/19 1045  NA 137  K 5.9*  CL 96*  CO2 29  GLUCOSE  126*  BUN 48*  CREATININE 5.89*  CALCIUM 8.7*      Most recent Creatinine trend  Lab Results  Component Value Date   CREATININE 5.89 (H) 12/08/2019   CREATININE 4.78 (H) 12/07/2019   CREATININE 6.50 (H) 12/06/2019      MICRO   Recent Results (from the past 240 hour(s))  Respiratory Panel by RT PCR (Flu A&B, Covid) - Nasopharyngeal Swab     Status: None   Collection Time: 12/05/19 10:04 PM   Specimen: Nasopharyngeal Swab  Result Value Ref Range Status   SARS Coronavirus 2 by RT PCR NEGATIVE NEGATIVE Final    Comment: (NOTE) SARS-CoV-2 target nucleic acids are NOT DETECTED.  The SARS-CoV-2 RNA is generally detectable in upper respiratoy specimens during the acute phase of infection. The lowest concentration of SARS-CoV-2 viral copies this assay can detect is 131 copies/mL. A negative result does not preclude SARS-Cov-2 infection and should not be used as the sole basis for treatment or other patient management decisions. A negative result may occur with  improper specimen collection/handling, submission of specimen other than nasopharyngeal swab, presence of viral mutation(s) within the areas targeted by this assay, and inadequate number of viral copies (<131 copies/mL).  A negative result must be combined with clinical observations, patient history, and epidemiological information. The expected result is Negative.  Fact Sheet for Patients:  PinkCheek.be  Fact Sheet for Healthcare Providers:  GravelBags.it  This test is no t yet approved or cleared by the [place] FDA and  has been authorized for detection and/or diagnosis of SARS-CoV-2 by FDA under an Emergency Use Authorization (EUA). This EUA will remain  in effect (meaning this test can be used) for the duration of the COVID-19 declaration under Section 564(b)(1) of the Act, 21 U.S.C. section 360bbb-3(b)(1), unless the authorization is terminated  or revoked sooner.     Influenza A by PCR NEGATIVE NEGATIVE Final   Influenza B by PCR NEGATIVE NEGATIVE Final    Comment: (NOTE) The Xpert Xpress SARS-CoV-2/FLU/RSV assay is intended as an aid in  the diagnosis of influenza from Nasopharyngeal swab specimens and  should not be used as a sole basis for treatment. Nasal washings and  aspirates are unacceptable for Xpert Xpress SARS-CoV-2/FLU/RSV  testing.  Fact Sheet for Patients: PinkCheek.be  Fact Sheet for Healthcare Providers: GravelBags.it  This test is not yet approved or cleared by the [place] FDA and  has been authorized for detection and/or diagnosis of SARS-CoV-2 by  FDA under an Emergency Use Authorization (EUA). This EUA will remain  in effect (meaning this test can be used) for the duration of the  Covid-19 declaration under Section 564(b)(1) of the Act, 21  U.S.C. section 360bbb-3(b)(1), unless the authorization is  terminated or revoked. Performed at [facility], [address] [place], [place] [address]          Impression:   [name] is a 49 year old African-American male with a past medical history of End stage renal disease on hemodialysis, history of kidney transplant, lupus nephritis, and hypotension who was admitted to [facility] on 12/05/2019 for Hyperkalemia [E87.5] Cellulitis of right lower extremity [M54.650]  [facility] [place]/ Left AVG/TTS  1)Renal    End-stage renal disease Patient is on hemodialysis Patient is on Tuesday Thursday Saturday schedule Patient will be dialyzed today  2) hypotension  Blood pressure is stable    3)Anemia of chronic disease  CBC Latest Ref Rng & Units 12/08/2019 12/07/2019 12/05/2019  WBC 4.0 - 10.5 K/uL 7.3 7.9 10.7(H)  Hemoglobin 13.0 - 17.0 g/dL 7.4(L) 8.1(L) 9.0(L)  Hematocrit 39 - 52 % 24.0(L) 25.7(L) 29.0(L)  Platelets 150 - 400 K/uL 92(L) 96(L) 92(L)       HGb  is not at goal (9--11) Patient is on Epogen  4) Secondary hyperparathyroidism -CKD Mineral-Bone Disorder    Lab Results  Component Value Date   CALCIUM 8.7 (L) 12/08/2019   PHOS 5.2 (H) 12/07/2019    Secondary Hyperparathyroidism present Phosphorus at goal.   5) Pseudomonas aeruginosa wound infection/cellulitis of right lower extremity Patient was on IV antibiotics Patient last dose of meropenem was due on 12/09/2019 Patient is currently under wound care Primary team is following  6) Electrolytes   BMP Latest Ref Rng & Units 12/08/2019 12/07/2019 12/06/2019  Glucose 70 - 99 mg/dL 126(H) 73 112(H)  BUN 6 - 20 mg/dL 48(H) 38(H) 58(H)  Creatinine 0.61 - 1.24 mg/dL 5.89(H) 4.78(H) 6.50(H)  Sodium 135 - 145 mmol/L 137 136 135  Potassium 3.5 - 5.1 mmol/L 5.9(H) 5.4(H) 6.0(H)  Chloride 98 - 111 mmol/L 96(L) 95(L) 96(L)  CO2 22 - 32 mmol/L 29 29 27   Calcium 8.9 - 10.3 mg/dL 8.7(L) 8.6(L) 9.0  Sodium Normonatremic   Potassium Hyperkalemia Patient is being dialyzed with low potassium bath    7)Acid base Co2 at goal  8) SLE Patient continues to be on steroids   Plan:   Will continue current care.     Tian Mcmurtrey s Theador Hawthorne 12/10/2019, 8:22 AM

## 2019-12-11 MED ORDER — SEVELAMER CARBONATE 800 MG PO TABS
800.0000 mg | ORAL_TABLET | Freq: Two times a day (BID) | ORAL | Status: DC
  Administered 2019-12-15: 800 mg via ORAL
  Filled 2019-12-11 (×20): qty 1

## 2019-12-11 NOTE — Progress Notes (Signed)
PROGRESS NOTE    [name]  DPO:[id number] DOB: 28-Mar-1970 DOA: 12/05/2019 PCP: [facility]   Brief Narrative:  49 y.o. 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  He remains on IV meropenem.  No indication of sepsis or septic shock at this time.  Patient was only out of the hospital for a few hours.  Nephrology consulted.  Message sent to ID for recommendations.  Communicated with infectious disease.  Last day of antibiotics will be 12/09/2019.  Communicated this with patient.  He is understanding.  Plan to discharge to inpatient rehab facility once bed is found.  Although patient did leave AMA his previous admission this time he is motivated and actively seeking inpatient rehab facility.  I believe he is motivated to improve and recover and at this time risk of him leaving AMA is minimal.  11/19: Last day of antibiotics.  Had a long discussion with patient about disposition plan.  He continues to express his desire to go to inpatient rehab.  From my perspective he is motivated and wishes to improve.    In My clinical impression inpatient rehab would be the best environment which he can continue to recover.  I believe he is motivated and not a high risk of leaving [facility] despite his actions during this hospitalization.  He has been adherent to care and working physical therapy and every opportunity.   Assessment & Plan:   Principal Problem:   Hyperkalemia Active Problems:   Cellulitis of  right lower extremity   ESRD on hemodialysis (HCC)   Systemic lupus erythematosus, unspecified ([place])   History of Renal transplant failure   Pseudomonas aeruginosa wound infection  Hyperkalemia ESRD on hemodialysis ([place]) with history of failed renal transplant -Patient received calcium gluconate and Veltassa in the emergency room HD 12/06/2019 HD  12/08/2019 HD 12/10/2019 Plan: Continue Veltassa per nephrology recommendations Draw labs during HD No RRT today  Pseudomonas aeruginosa wound infection Cellulitis of right lower extremity Wound right lower leg Communicated with infectious disease Completed meropenem, last dose 12/09/2019 Continue wound care per [facility] recommendations  Systemic lupus erythematosus, unspecified ([place]) Continue prednisone   DVT prophylaxis: Heparin subcutaneous Code Status: Full code Family Communication: None today Disposition Plan: Status is: Inpatient  Remains inpatient appropriate because:Unsafe d/c plan   Dispo: The patient is from: Home              Anticipated d/c is to: Inpatient rehab              Anticipated d/c date is: 1 day              Patient currently is medically stable to d/c.   Patient has completed his course of meropenem in house.  No further need for IV antibiotics.  He will continue dialysis in house until we have a safe disposition plan.   Consultants:   Nephrology  Procedures:   None  Antimicrobials:   Meropenem, last dose 12/08/2019   Subjective: Patient seen and examined.  No pain complaints  this morning.  Objective: Vitals:   12/10/19 1405 12/10/19 1528 12/11/19 0007 12/11/19 0744  BP: (!) 129/58 (!) 101/55 (!) 101/52 (!) 112/48  Pulse: 77 77 69 77  Resp: 13 18 17 16   Temp: 97.7 F (36.5 C) 98.2 F (36.8 C) 98.6 F (37 C) 98.2 F (36.8 C)  TempSrc: Oral Oral Oral   SpO2: 100%  100% 100%  Weight:      Height:        Intake/Output Summary (Last 24 hours) at 12/11/2019 1114 Last data filed at  12/10/2019 2200 Gross per 24 hour  Intake 240 ml  Output 1000 ml  Net -760 ml   Filed Weights   12/05/19 1757  Weight: 95.3 kg    Examination:  General exam: Appears calm and comfortable  Respiratory system: Clear to auscultation. Respiratory effort normal. Cardiovascular system: S1 & S2 heard, RRR. No JVD, murmurs, rubs, gallops or clicks. No pedal edema. Gastrointestinal system: Abdomen is nondistended, soft and nontender. No organomegaly or masses felt. Normal bowel sounds heard. Central nervous system: Alert and oriented. No focal neurological deficits. Extremities: Symmetric 5 x 5 power.  LUE AVG + thrill Skin: Right shin wound, superficial Psychiatry: Judgement and insight appear normal. Mood & affect appropriate.     Data Reviewed: I have personally reviewed following labs and imaging studies  CBC: Recent Labs  Lab 12/05/19 0432 12/05/19 1827 12/05/19 2335 12/07/19 0724 12/08/19 1045  WBC 9.7 11.5* 10.7* 7.9 7.3  NEUTROABS 7.8* 10.2*  --  6.0 5.5  HGB 8.6* 9.2* 9.0* 8.1* 7.4*  HCT 28.8* 28.4* 29.0* 25.7* 24.0*  MCV 98.0 93.7 96.7 95.5 96.4  PLT 110* 106* 92* 96* 92*   Basic Metabolic Panel: Recent Labs  Lab 12/05/19 0432 12/05/19 1827 12/05/19 2335 12/06/19 0925 12/07/19 0724 12/08/19 1045 12/10/19 1035  NA 139   < > 139 135 136 137 132*  K 5.9*   < > 6.5* 6.0* 5.4* 5.9* 5.9*  CL 99   < > 97* 96* 95* 96* 94*  CO2 29   < > 29 27 29 29 27   GLUCOSE 79   < > 91 112* 73 126* 89  BUN 48*   < > 55* 58* 38* 48* 46*  CREATININE 5.36*   < > 6.23* 6.50* 4.78* 5.89* 5.72*  CALCIUM 9.2   < > 9.3 9.0 8.6* 8.7* 8.7*  MG 2.0  --   --   --  1.8  --   --   PHOS 5.8*  --   --   --  5.2*  --  6.0*   < > = values in this interval not displayed.   GFR: Estimated Creatinine Clearance: 18 mL/min (A) (by C-G formula based on SCr of 5.72 mg/dL (H)). Liver Function Tests: Recent Labs  Lab 12/05/19 0432 12/07/19 0724  ALBUMIN 2.6* 2.4*   No results for input(s):  LIPASE, AMYLASE in the last 168 hours. No results for input(s): AMMONIA in the last 168 hours. Coagulation Profile: No results for input(s): INR, PROTIME in the last 168 hours. Cardiac Enzymes: No results for input(s): CKTOTAL, CKMB, CKMBINDEX, TROPONINI in the last 168 hours. BNP (last 3 results) No results for input(s): PROBNP in the last 8760 hours. HbA1C: No results for input(s): HGBA1C in the last 72 hours. CBG: No results for input(s): GLUCAP in the last 168 hours. Lipid Profile: No results for input(s): CHOL, HDL, LDLCALC, TRIG, CHOLHDL, LDLDIRECT in the last 72 hours. Thyroid Function Tests: No results  for input(s): TSH, T4TOTAL, FREET4, T3FREE, THYROIDAB in the last 72 hours. Anemia Panel: No results for input(s): VITAMINB12, FOLATE, FERRITIN, TIBC, IRON, RETICCTPCT in the last 72 hours. Sepsis Labs: No results for input(s): PROCALCITON, LATICACIDVEN in the last 168 hours.  Recent Results (from the past 240 hour(s))  Respiratory Panel by RT PCR (Flu A&B, Covid) - Nasopharyngeal Swab     Status: None   Collection Time: 12/05/19 10:04 PM   Specimen: Nasopharyngeal Swab  Result Value Ref Range Status   SARS Coronavirus 2 by RT PCR NEGATIVE NEGATIVE Final    Comment: (NOTE) SARS-CoV-2 target nucleic acids are NOT DETECTED.  The SARS-CoV-2 RNA is generally detectable in upper respiratoy specimens during the acute phase of infection. The lowest concentration of SARS-CoV-2 viral copies this assay can detect is 131 copies/mL. A negative result does not preclude SARS-Cov-2 infection and should not be used as the sole basis for treatment or other patient management decisions. A negative result may occur with  improper specimen collection/handling, submission of specimen other than nasopharyngeal swab, presence of viral mutation(s) within the areas targeted by this assay, and inadequate number of viral copies (<131 copies/mL). A negative result must be combined with  clinical observations, patient history, and epidemiological information. The expected result is Negative.  Fact Sheet for Patients:  PinkCheek.be  Fact Sheet for Healthcare Providers:  GravelBags.it  This test is no t yet approved or cleared by the [place] FDA and  has been authorized for detection and/or diagnosis of SARS-CoV-2 by FDA under an Emergency Use Authorization (EUA). This EUA will remain  in effect (meaning this test can be used) for the duration of the COVID-19 declaration under Section 564(b)(1) of the Act, 21 U.S.C. section 360bbb-3(b)(1), unless the authorization is terminated or revoked sooner.     Influenza A by PCR NEGATIVE NEGATIVE Final   Influenza B by PCR NEGATIVE NEGATIVE Final    Comment: (NOTE) The Xpert Xpress SARS-CoV-2/FLU/RSV assay is intended as an aid in  the diagnosis of influenza from Nasopharyngeal swab specimens and  should not be used as a sole basis for treatment. Nasal washings and  aspirates are unacceptable for Xpert Xpress SARS-CoV-2/FLU/RSV  testing.  Fact Sheet for Patients: PinkCheek.be  Fact Sheet for Healthcare Providers: GravelBags.it  This test is not yet approved or cleared by the [place] FDA and  has been authorized for detection and/or diagnosis of SARS-CoV-2 by  FDA under an Emergency Use Authorization (EUA). This EUA will remain  in effect (meaning this test can be used) for the duration of the  Covid-19 declaration under Section 564(b)(1) of the Act, 21  U.S.C. section 360bbb-3(b)(1), unless the authorization is  terminated or revoked. Performed at [facility], [address], [place], [place] [address]          Radiology Studies: No results found.      Scheduled Meds: . aspirin EC  81 mg Oral Daily  . collagenase   Topical Daily  . epoetin (EPOGEN/PROCRIT) injection   10,000 Units Intravenous Q T,Th,Sa-HD  . feeding supplement (NEPRO CARB STEADY)  237 mL Oral BID BM  . ferrous sulfate  325 mg Oral BID  . heparin  5,000 Units Subcutaneous Q8H  . midodrine  10 mg Oral TID WC  . multivitamin  1 tablet Oral QHS  . pantoprazole  40 mg Oral BID  . patiromer  16.8 g Oral Daily  . predniSONE  10 mg Oral Daily  . sevelamer carbonate  800 mg Oral BID WC  . traZODone  50 mg Oral QHS   Continuous Infusions: . albumin human 25 g (12/10/19 1516)     LOS: 5 days    Time spent: 15 minutes    Sidney Ace, MD [facility] Pager [phone]  If 7PM-7AM, please contact night-coverage 12/11/2019, 11:14 AM

## 2019-12-11 NOTE — Progress Notes (Signed)
[name]  MRN: [id number]  DOB/AGE: 1970-09-07 49 y.o.  Primary Care Physician:[facility]  Admit date: 12/05/2019  Chief Complaint:  Chief Complaint  Patient presents with  . Wound Infection  . dialysis    S-Pt presented on  12/05/2019 with  Chief Complaint  Patient presents with  . Wound Infection  . dialysis  . Patient offers no new physical complaints  Medications . aspirin EC  81 mg Oral Daily  . collagenase   Topical Daily  . epoetin (EPOGEN/PROCRIT) injection  10,000 Units Intravenous Q T,Th,Sa-HD  . feeding supplement (NEPRO CARB STEADY)  237 mL Oral BID BM  . ferrous sulfate  325 mg Oral BID  . heparin  5,000 Units Subcutaneous Q8H  . midodrine  10 mg Oral TID WC  . multivitamin  1 tablet Oral QHS  . pantoprazole  40 mg Oral BID  . patiromer  16.8 g Oral Daily  . predniSONE  10 mg Oral Daily  . traZODone  50 mg Oral QHS         JJK:KXFGH from the symptoms mentioned above,there are no other symptoms referable to all systems reviewed.  Physical Exam: Vital signs in last 24 hours: Temp:  [97.6 F (36.4 C)-98.6 F (37 C)] 98.6 F (37 C) (11/21 0007) Pulse Rate:  [55-77] 69 (11/21 0007) Resp:  [13-18] 17 (11/21 0007) BP: (94-129)/(43-58) 101/52 (11/21 0007) SpO2:  [98 %-100 %] 100 % (11/21 0007) Weight change:  Last BM Date: 12/09/19  Intake/Output from previous day: 11/20 0701 - 11/21 0700 In: 240 [P.O.:240] Out: 1000  No intake/output data recorded.   Physical Exam:  General- pt is awake,alert, oriented to time place and person  Resp- No acute REsp distress, CTA B/L NO Rhonchi  CVS- S1S2 regular in rate and rhythm  GIT- BS+, soft, Non tender , Non distended  EXT- NO LE Edema, Cyanosis  Access- RUE  AVG .  Lab Results:  CBC  Recent Labs    12/08/19 1045  WBC 7.3  HGB 7.4*  HCT 24.0*  PLT 92*    BMET  Recent Labs    12/08/19 1045 12/10/19 1035  NA 137 132*  K 5.9* 5.9*  CL 96* 94*  CO2 29 27   GLUCOSE 126* 89  BUN 48* 46*  CREATININE 5.89* 5.72*  CALCIUM 8.7* 8.7*      Most recent Creatinine trend  Lab Results  Component Value Date   CREATININE 5.72 (H) 12/10/2019   CREATININE 5.89 (H) 12/08/2019   CREATININE 4.78 (H) 12/07/2019      MICRO   Recent Results (from the past 240 hour(s))  Respiratory Panel by RT PCR (Flu A&B, Covid) - Nasopharyngeal Swab     Status: None   Collection Time: 12/05/19 10:04 PM   Specimen: Nasopharyngeal Swab  Result Value Ref Range Status   SARS Coronavirus 2 by RT PCR NEGATIVE NEGATIVE Final    Comment: (NOTE) SARS-CoV-2 target nucleic acids are NOT DETECTED.  The SARS-CoV-2 RNA is generally detectable in upper respiratoy specimens during the acute phase of infection. The lowest concentration of SARS-CoV-2 viral copies this assay can detect is 131 copies/mL. A negative result does not preclude SARS-Cov-2 infection and should not be used as the sole basis for treatment or other patient management decisions. A negative result may occur with  improper specimen collection/handling, submission of specimen other than nasopharyngeal swab, presence of viral mutation(s) within the areas targeted by this assay, and inadequate number of viral copies (<  131 copies/mL). A negative result must be combined with clinical observations, patient history, and epidemiological information. The expected result is Negative.  Fact Sheet for Patients:  PinkCheek.be  Fact Sheet for Healthcare Providers:  GravelBags.it  This test is no t yet approved or cleared by the [place] FDA and  has been authorized for detection and/or diagnosis of SARS-CoV-2 by FDA under an Emergency Use Authorization (EUA). This EUA will remain  in effect (meaning this test can be used) for the duration of the COVID-19 declaration under Section 564(b)(1) of the Act, 21 U.S.C. section 360bbb-3(b)(1), unless the  authorization is terminated or revoked sooner.     Influenza A by PCR NEGATIVE NEGATIVE Final   Influenza B by PCR NEGATIVE NEGATIVE Final    Comment: (NOTE) The Xpert Xpress SARS-CoV-2/FLU/RSV assay is intended as an aid in  the diagnosis of influenza from Nasopharyngeal swab specimens and  should not be used as a sole basis for treatment. Nasal washings and  aspirates are unacceptable for Xpert Xpress SARS-CoV-2/FLU/RSV  testing.  Fact Sheet for Patients: PinkCheek.be  Fact Sheet for Healthcare Providers: GravelBags.it  This test is not yet approved or cleared by the [place] FDA and  has been authorized for detection and/or diagnosis of SARS-CoV-2 by  FDA under an Emergency Use Authorization (EUA). This EUA will remain  in effect (meaning this test can be used) for the duration of the  Covid-19 declaration under Section 564(b)(1) of the Act, 21  U.S.C. section 360bbb-3(b)(1), unless the authorization is  terminated or revoked. Performed at [facility], [address] [place], [place] [address]          Impression:   [name] is a 49 year old African-American male with a past medical history of End stage renal disease on hemodialysis, history of kidney transplant, lupus nephritis, and hypotension who was admitted to [facility] on 12/05/2019 for Hyperkalemia [E87.5] Cellulitis of right lower extremity [X91.478]  [facility] [place]/ Left AVG/TTS  1)Renal    End-stage renal disease Patient is on hemodialysis Patient is on Tuesday Thursday Saturday schedule Patient was last dialyzed yesterday  2) hypotension  Blood pressure is stable    3)Anemia of chronic disease  CBC Latest Ref Rng & Units 12/08/2019 12/07/2019 12/05/2019  WBC 4.0 - 10.5 K/uL 7.3 7.9 10.7(H)  Hemoglobin 13.0 - 17.0 g/dL 7.4(L) 8.1(L) 9.0(L)  Hematocrit 39 - 52 % 24.0(L) 25.7(L) 29.0(L)  Platelets 150 - 400  K/uL 92(L) 96(L) 92(L)       HGb is not at goal (9--11) Patient is on Epogen  4) Secondary hyperparathyroidism -CKD Mineral-Bone Disorder    Lab Results  Component Value Date   CALCIUM 8.7 (L) 12/10/2019   PHOS 6.0 (H) 12/10/2019    Secondary Hyperparathyroidism present Phosphorus is not at goal. We will start on binders  5) Pseudomonas aeruginosa wound infection/cellulitis of right lower extremity Patient was on IV antibiotics Patient last dose of meropenem was due on 12/09/2019 Patient is currently under wound care Primary team is following  6) Electrolytes   BMP Latest Ref Rng & Units 12/10/2019 12/08/2019 12/07/2019  Glucose 70 - 99 mg/dL 89 126(H) 73  BUN 6 - 20 mg/dL 46(H) 48(H) 38(H)  Creatinine 0.61 - 1.24 mg/dL 5.72(H) 5.89(H) 4.78(H)  Sodium 135 - 145 mmol/L 132(L) 137 136  Potassium 3.5 - 5.1 mmol/L 5.9(H) 5.9(H) 5.4(H)  Chloride 98 - 111 mmol/L 94(L) 96(L) 95(L)  CO2 22 - 32 mmol/L 27 29 29  Calcium 8.9 - 10.3 mg/dL 8.7(L) 8.7(L) 8.6(L)     Sodium Normonatremic   Potassium Hyperkalemia Patient was last dialyzed yesterday    7)Acid base Co2 at goal  8) SLE Patient continues to be on steroids   Plan:   We will start on binders No need for renal replacement therapy today   Elga Santy s Cleotis Sparr 12/11/2019, 7:10 AM

## 2019-12-11 NOTE — Progress Notes (Addendum)
Physical Therapy Treatment Patient Details Name: [name] MRN: [id number] DOB: Jan 05, 1971 Today's Date: 12/11/2019    History of Present Illness Recently here and left AMA to try and get dialysis off site, unable and returns within a few hours. 2 weeks ago came in secondary to R LE swelling, pain; admitted for management of severe cellulitis of R LE.  Hospital course additionally significant for hypotension, requiring transfer to CCU and pressor support; episode of vtach with synchronized cardioversion (10/30), CRRT (via temp dialysis cath, left) 10/30-11/2.     PT Comments    Patient motivated to participate with therapy. Patient participated with LE therapeutic exercises with emphasis on quad muscle activation with verbal and tactile cues for technique. Short rest break required between bouts of exercise with shortness of breath. Patient required minimal assistance for bed mobility. Sit to stand transfers continue to require assistance from elevated bed height. Min A for steadying with ambulation in hallway, cues for safety and technique using rolling walker with knee hyperextension still present. Recommend to continue PT to maximize independence and address functional limitations remaining. Recommend inpatient rehab at discharge.    Follow Up Recommendations  CIR     Equipment Recommendations  Rolling walker with 5" wheels    Recommendations for Other Services       Precautions / Restrictions Precautions Precautions: Fall Precaution Comments: left AVF, distal RLE wound  Restrictions Weight Bearing Restrictions: No    Mobility  Bed Mobility Overal bed mobility: Needs Assistance       Supine to sit: Min assist Sit to supine: Min assist   General bed mobility comments: assistance required for trunk support to sit upright. assistance for BLE support to return to bed. cues for technique and task initiation   Transfers Overall transfer level: Needs assistance Equipment  used: Rolling walker (2 wheeled) Transfers: Sit to/from Stand Sit to Stand: From elevated surface;Mod assist         General transfer comment: lifting assistance is required for standing even with bed height in elevated position. cues for technique   Ambulation/Gait Ambulation/Gait assistance: Min assist Gait Distance (Feet): 100 Feet Assistive device: Rolling walker (2 wheeled) Gait Pattern/deviations: Narrow base of support (knee hyperextension bilaterally ) Gait velocity: decreased   General Gait Details: One standing rest break required with ambulation. Verbal cues for safety and upright standing posture. patient relying heavily on rolling walker for support and with knee hyper extension bilaterally (left more than right) likely due to significant weakness in LE. prolonged walking limited due to generalized muscle weakness and fatigue with activity. Sp02 100% after walking on 4 L02    Stairs             Wheelchair Mobility    Modified Rankin (Stroke Patients Only)       Balance Overall balance assessment: Needs assistance Sitting-balance support: No upper extremity supported;Feet supported Sitting balance-Leahy Scale: Good     Standing balance support: Bilateral upper extremity supported Standing balance-Leahy Scale: Fair Standing balance comment: patient relying heavily on rolling walker for UE support in standing position, knees in static hyperextension to keep from buclkling                            Cognition Arousal/Alertness: Awake/alert Behavior During Therapy: WFL for tasks assessed/performed Overall Cognitive Status: Within Functional Limits for tasks assessed  Exercises General Exercises - Lower Extremity Quad Sets: AROM;Strengthening;Both;15 reps;Supine Short Arc Quad: AAROM;Strengthening;Both;15 reps;Supine Heel Slides: AAROM;Strengthening;Both;15 reps;Supine Straight Leg Raises:  AAROM;Strengthening;Both;Supine;10 reps Other Exercises Other Exercises: verbal and visual cues for exercise technique     General Comments        Pertinent Vitals/Pain Pain Assessment: No/denies pain    Home Living                      Prior Function            PT Goals (current goals can now be found in the care plan section) Acute Rehab PT Goals Patient Stated Goal: to go to rehab  PT Goal Formulation: With patient Time For Goal Achievement: 12/21/19 Potential to Achieve Goals: Fair Progress towards PT goals: Progressing toward goals    Frequency    Min 2X/week      PT Plan Current plan remains appropriate    Co-evaluation              AM-PAC PT "6 Clicks" Mobility   Outcome Measure  Help needed turning from your back to your side while in a flat bed without using bedrails?: A Little Help needed moving from lying on your back to sitting on the side of a flat bed without using bedrails?: A Lot Help needed moving to and from a bed to a chair (including a wheelchair)?: A Lot Help needed standing up from a chair using your arms (e.g., wheelchair or bedside chair)?: A Lot Help needed to walk in hospital room?: A Lot Help needed climbing 3-5 steps with a railing? : Total 6 Click Score: 12    End of Session Equipment Utilized During Treatment: Gait belt;Oxygen Activity Tolerance: Patient tolerated treatment well Patient left: in bed;with bed alarm set;with call bell/phone within reach Nurse Communication: Mobility status PT Visit Diagnosis: Muscle weakness (generalized) (M62.81);Difficulty in walking, not elsewhere classified (R26.2)     Time: 2130-8657 PT Time Calculation (min) (ACUTE ONLY): 63 min  Charges:  $Gait Training: 8-22 mins $Therapeutic Exercise: 23-37 mins $Therapeutic Activity: 8-22 mins                     [name], PT, MPT    [name] 12/11/2019, 3:34 PM

## 2019-12-11 NOTE — Progress Notes (Signed)
Pt has been refusing multiple medications including Heparin SQ. Educate pt on the importance of taking Lovenox SQ but still refused. MD aware.

## 2019-12-12 MED ORDER — PATIROMER SORBITEX CALCIUM 8.4 G PO PACK
25.2000 g | PACK | Freq: Every day | ORAL | Status: DC
  Filled 2019-12-12 (×7): qty 3

## 2019-12-12 MED ORDER — SODIUM BICARBONATE 650 MG PO TABS
1300.0000 mg | ORAL_TABLET | Freq: Two times a day (BID) | ORAL | Status: DC
  Administered 2019-12-12 – 2019-12-14 (×5): 1300 mg via ORAL
  Filled 2019-12-12 (×20): qty 2

## 2019-12-12 NOTE — Progress Notes (Signed)
[facility]  ROUNDING NOTE   Subjective:    Patient appears in no acute distress, has c/o heart burn today, but does not want to take any medications for that yet. Denies nausea or vomiting. Patient continues to be on O2 2-3L via nasal cannula, denies worsening SOB.      Objective:  Vital signs in last 24 hours:  Temp:  [98 F (36.7 C)-98.2 F (36.8 C)] 98.1 F (36.7 C) (11/22 0752) Pulse Rate:  [52-70] 52 (11/22 0752) Resp:  [16-17] 16 (11/22 0752) BP: (125-132)/(51-54) 132/54 (11/22 0752) SpO2:  [100 %] 100 % (11/22 0752)  Weight change:  Filed Weights   12/05/19 1757  Weight: 95.3 kg    Intake/Output: I/O last 3 completed shifts: In: 480 [P.O.:480] Out: 0    Intake/Output this shift:  Total I/O In: 480 [P.O.:480] Out: -   Physical Exam: General:  In no acute distress  Head:  Normocephalic,atraumatic  Eyes: Sclerae and conjunctivae celar  Lungs:    Normal and symmetrical respiratory effort,lungs clear  Heart:  S1S2, no rubs or gallops  Abdomen:  Soft, non tender,non distended  Extremities:  1+ peripheral edema  Neurologic: Awake, alert, oriented  Skin: Rt leg with dressing clean,dry and intact  Access: Left AVG +bruit,+thrill    Basic Metabolic Panel: Recent Labs  Lab 12/05/19 2335 12/05/19 2335 12/06/19 0925 12/06/19 0925 12/07/19 0724 12/08/19 1045 12/10/19 1035  NA 139  --  135  --  136 137 132*  K 6.5*  --  6.0*  --  5.4* 5.9* 5.9*  CL 97*  --  96*  --  95* 96* 94*  CO2 29  --  27  --  29 29 27   GLUCOSE 91  --  112*  --  73 126* 89  BUN 55*  --  58*  --  38* 48* 46*  CREATININE 6.23*  --  6.50*  --  4.78* 5.89* 5.72*  CALCIUM 9.3   < > 9.0   < > 8.6* 8.7* 8.7*  MG  --   --   --   --  1.8  --   --   PHOS  --   --   --   --  5.2*  --  6.0*   < > = values in this interval not displayed.    Liver Function Tests: Recent Labs  Lab 12/07/19 0724  ALBUMIN 2.4*   No results for input(s): LIPASE, AMYLASE in the last 168  hours. No results for input(s): AMMONIA in the last 168 hours.  CBC: Recent Labs  Lab 12/05/19 1827 12/05/19 2335 12/07/19 0724 12/08/19 1045  WBC 11.5* 10.7* 7.9 7.3  NEUTROABS 10.2*  --  6.0 5.5  HGB 9.2* 9.0* 8.1* 7.4*  HCT 28.4* 29.0* 25.7* 24.0*  MCV 93.7 96.7 95.5 96.4  PLT 106* 92* 96* 92*    Cardiac Enzymes: No results for input(s): CKTOTAL, CKMB, CKMBINDEX, TROPONINI in the last 168 hours.  BNP: Invalid input(s): POCBNP  CBG: No results for input(s): GLUCAP in the last 168 hours.  Microbiology: Results for orders placed or performed during the hospital encounter of 12/05/19  Respiratory Panel by RT PCR (Flu A&B, Covid) - Nasopharyngeal Swab     Status: None   Collection Time: 12/05/19 10:04 PM   Specimen: Nasopharyngeal Swab  Result Value Ref Range Status   SARS Coronavirus 2 by RT PCR NEGATIVE NEGATIVE Final    Comment: (NOTE) SARS-CoV-2 target nucleic acids are NOT DETECTED.  The SARS-CoV-2 RNA is generally detectable in upper respiratoy specimens during the acute phase of infection. The lowest concentration of SARS-CoV-2 viral copies this assay can detect is 131 copies/mL. A negative result does not preclude SARS-Cov-2 infection and should not be used as the sole basis for treatment or other patient management decisions. A negative result may occur with  improper specimen collection/handling, submission of specimen other than nasopharyngeal swab, presence of viral mutation(s) within the areas targeted by this assay, and inadequate number of viral copies (<131 copies/mL). A negative result must be combined with clinical observations, patient history, and epidemiological information. The expected result is Negative.  Fact Sheet for Patients:  PinkCheek.be  Fact Sheet for Healthcare Providers:  GravelBags.it  This test is no t yet approved or cleared by the [place] FDA and  has been  authorized for detection and/or diagnosis of SARS-CoV-2 by FDA under an Emergency Use Authorization (EUA). This EUA will remain  in effect (meaning this test can be used) for the duration of the COVID-19 declaration under Section 564(b)(1) of the Act, 21 U.S.C. section 360bbb-3(b)(1), unless the authorization is terminated or revoked sooner.     Influenza A by PCR NEGATIVE NEGATIVE Final   Influenza B by PCR NEGATIVE NEGATIVE Final    Comment: (NOTE) The Xpert Xpress SARS-CoV-2/FLU/RSV assay is intended as an aid in  the diagnosis of influenza from Nasopharyngeal swab specimens and  should not be used as a sole basis for treatment. Nasal washings and  aspirates are unacceptable for Xpert Xpress SARS-CoV-2/FLU/RSV  testing.  Fact Sheet for Patients: PinkCheek.be  Fact Sheet for Healthcare Providers: GravelBags.it  This test is not yet approved or cleared by the [place] FDA and  has been authorized for detection and/or diagnosis of SARS-CoV-2 by  FDA under an Emergency Use Authorization (EUA). This EUA will remain  in effect (meaning this test can be used) for the duration of the  Covid-19 declaration under Section 564(b)(1) of the Act, 21  U.S.C. section 360bbb-3(b)(1), unless the authorization is  terminated or revoked. Performed at [facility], [address] [place], [place] [address]     Coagulation Studies: No results for input(s): LABPROT, INR in the last 72 hours.  Urinalysis: No results for input(s): COLORURINE, LABSPEC, PHURINE, GLUCOSEU, HGBUR, BILIRUBINUR, KETONESUR, PROTEINUR, UROBILINOGEN, NITRITE, LEUKOCYTESUR in the last 72 hours.  Invalid input(s): APPERANCEUR    Imaging: No results found.   Medications:   . albumin human 25 g (12/10/19 1516)   . aspirin EC  81 mg Oral Daily  . collagenase   Topical Daily  . epoetin (EPOGEN/PROCRIT) injection  10,000 Units Intravenous Q  T,Th,Sa-HD  . feeding supplement (NEPRO CARB STEADY)  237 mL Oral BID BM  . ferrous sulfate  325 mg Oral BID  . heparin  5,000 Units Subcutaneous Q8H  . midodrine  10 mg Oral TID WC  . multivitamin  1 tablet Oral QHS  . pantoprazole  40 mg Oral BID  . patiromer  16.8 g Oral Daily  . predniSONE  10 mg Oral Daily  . sevelamer carbonate  800 mg Oral BID WC  . traZODone  50 mg Oral QHS   acetaminophen **OR** acetaminophen, calcium carbonate, loperamide, ondansetron **OR** ondansetron (ZOFRAN) IV, oxyCODONE, promethazine  Assessment/ Plan:  Mr. JHAYDEN DEMURO is a 49 y.o. black male with end stage renal disease on hemodialysis, history of kidney transplant, lupus nephritis, and hypotension who was admitted to [facility] on 12/05/2019 for Hyperkalemia [E87.5]  Cellulitis of right lower extremity [L03.115]  [facility] [place]/ Left AVG/TTS  # End stage renal disease.  Patient received dialysis on Saturday Volume status acceptable No acute indication for additional dialysis today Will continue TTS schedule  #Anemia with chronic kidney disease.   Lab Results  Component Value Date   HGB 7.4 (L) 12/08/2019  Will continue PO Iron supplements Epogen 10,000 units with dialysis TTS  #Secondary Hyperparathyroidism Lab Results  Component Value Date   CALCIUM 8.7 (L) 12/10/2019   PHOS 6.0 (H) 12/10/2019  Patient is on Sevelamer  #Hyperkalemia Potassium elevated to 5.9 Discussed with attending nephrologist,Dr.Kolluru Veltassa dose increased Added Sodium Bicarbonate     LOS: 6 Jacqueline Spofford 11/22/202112:02 PM  I

## 2019-12-12 NOTE — Progress Notes (Signed)
OT Cancellation Note  Patient Details Name: [name] MRN: [id number] DOB: 1970/12/13   Cancelled Treatment:    Reason Eval/Treat Not Completed: Medical issues which prohibited therapy. Pt with K+ of 5.9 which is contraindicated for participation in OT therapeutic intervention. OT will hold today and follow up when pt is able to participate.   Darleen Crocker, [place], OTR/L , CBIS ascom [phone]  12/12/19, 11:34 AM    12/12/2019, 11:33 AM

## 2019-12-12 NOTE — Progress Notes (Signed)
PROGRESS NOTE    [name]  WNU:[id number] DOB: 1970/04/19 DOA: 12/05/2019 PCP: [facility]   Brief Narrative:  49 y.o. 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  He remains on IV meropenem.  No indication of sepsis or septic shock at this time.  Patient was only out of the hospital for a few hours.  Nephrology consulted.  Message sent to ID for recommendations.  Communicated with infectious disease.  Last day of antibiotics will be 12/09/2019.  Communicated this with patient.  He is understanding.  Plan to discharge to inpatient rehab facility once bed is found.  Although patient did leave AMA his previous admission this time he is motivated and actively seeking inpatient rehab facility.  I believe he is motivated to improve and recover and at this time risk of him leaving AMA is minimal.  11/19: Last day of antibiotics.  Had a long discussion with patient about disposition plan.  He continues to express his desire to go to inpatient rehab.  From my perspective he is motivated and wishes to improve.    In My clinical impression inpatient rehab would be the best environment which he can continue to recover.  I believe he is motivated and not a high risk of leaving [place] despite his actions during this hospitalization.  He has been adherent to care and working physical therapy and every opportunity.   Assessment & Plan:   Principal Problem:   Hyperkalemia Active Problems:   Cellulitis of  right lower extremity   ESRD on hemodialysis (HCC)   Systemic lupus erythematosus, unspecified ([place])   History of Renal transplant failure   Pseudomonas aeruginosa wound infection  Hyperkalemia ESRD on hemodialysis ([place]) with history of failed renal transplant -Patient received calcium gluconate and Veltassa in the emergency room HD 12/06/2019 HD  12/08/2019 HD 12/10/2019 Plan: Nephrology following No negation for inpatient dialysis today   Pseudomonas aeruginosa wound infection Cellulitis of right lower extremity Wound right lower leg Communicated with infectious disease regarding antibiotic recommendations.  Patient completed his last dose of meropenem on 12/09/2019.  He has no further indication for systemic antibiotic therapy.  Continue wound care per WOCN recommendations.  Systemic lupus erythematosus, unspecified ([place]) Continue prednisone per home dose   DVT prophylaxis: Heparin subcutaneous Code Status: Full code Family Communication: None today Disposition Plan: Status is: Inpatient  Remains inpatient appropriate because:Unsafe d/c plan   Dispo: The patient is from: Home              Anticipated d/c is to: Inpatient rehab              Anticipated d/c date is: 1 day              Patient currently is medically stable to d/c.   Patient has completed his IV antibiotic course in house.  No further need for IV antibiotic therapy.  We will continue dialysis in house until we have a safe disposition plan.  Plan to discharge to inpatient rehab once we have  an accepting facility   Consultants:   Nephrology  Procedures:   None  Antimicrobials:      Subjective: Patient seen and examined.  No pain complaints this morning.  Objective: Vitals:   12/11/19 0744 12/11/19 1509 12/11/19 2328 12/12/19 0752  BP: (!) 112/48 (!) 126/51 (!) 125/54 (!) 132/54  Pulse: 77 70 68 (!) 52  Resp: 16 16 17 16   Temp: 98.2 F (36.8 C) 98.2 F (36.8 C) 98 F (36.7 C) 98.1 F  (36.7 C)  TempSrc:    Oral  SpO2: 100% 100% 100% 100%  Weight:      Height:        Intake/Output Summary (Last 24 hours) at 12/12/2019 1321 Last data filed at 12/12/2019 1002 Gross per 24 hour  Intake 720 ml  Output 0 ml  Net 720 ml   Filed Weights   12/05/19 1757  Weight: 95.3 kg    Examination:  General exam: Appears calm and comfortable  Respiratory system: Clear to auscultation. Respiratory effort normal. Cardiovascular system: S1 & S2 heard, RRR. No JVD, murmurs, rubs, gallops or clicks. No pedal edema. Gastrointestinal system: Abdomen is nondistended, soft and nontender. No organomegaly or masses felt. Normal bowel sounds heard. Central nervous system: Alert and oriented. No focal neurological deficits. Extremities: Symmetric 5 x 5 power.  LUE AVG + thrill Skin: Right shin wound, superficial Psychiatry: Judgement and insight appear normal. Mood & affect appropriate.     Data Reviewed: I have personally reviewed following labs and imaging studies  CBC: Recent Labs  Lab 12/05/19 1827 12/05/19 2335 12/07/19 0724 12/08/19 1045  WBC 11.5* 10.7* 7.9 7.3  NEUTROABS 10.2*  --  6.0 5.5  HGB 9.2* 9.0* 8.1* 7.4*  HCT 28.4* 29.0* 25.7* 24.0*  MCV 93.7 96.7 95.5 96.4  PLT 106* 92* 96* 92*   Basic Metabolic Panel: Recent Labs  Lab 12/05/19 2335 12/06/19 0925 12/07/19 0724 12/08/19 1045 12/10/19 1035  NA 139 135 136 137 132*  K 6.5* 6.0* 5.4* 5.9* 5.9*  CL 97* 96* 95* 96* 94*  CO2 29 27 29 29 27   GLUCOSE 91 112* 73 126* 89  BUN 55* 58* 38* 48* 46*  CREATININE 6.23* 6.50* 4.78* 5.89* 5.72*  CALCIUM 9.3 9.0 8.6* 8.7* 8.7*  MG  --   --  1.8  --   --   PHOS  --   --  5.2*  --  6.0*   GFR: Estimated Creatinine Clearance: 18 mL/min (A) (by C-G formula based on SCr of 5.72 mg/dL (H)). Liver Function Tests: Recent Labs  Lab 12/07/19 0724  ALBUMIN 2.4*   No results for input(s): LIPASE, AMYLASE in the last 168 hours. No results for input(s): AMMONIA in the  last 168 hours. Coagulation Profile: No results for input(s): INR, PROTIME in the last 168 hours. Cardiac Enzymes: No results for input(s): CKTOTAL, CKMB, CKMBINDEX, TROPONINI in the last 168 hours. BNP (last 3 results) No results for input(s): PROBNP in the last 8760 hours. HbA1C: No results for input(s): HGBA1C in the last 72 hours. CBG: No results for input(s): GLUCAP in the last 168 hours. Lipid Profile: No results for input(s): CHOL, HDL, LDLCALC, TRIG, CHOLHDL, LDLDIRECT in the last 72 hours. Thyroid Function Tests: No results for input(s): TSH, T4TOTAL, FREET4, T3FREE, THYROIDAB in the last 72 hours. Anemia Panel: No results for input(s): VITAMINB12, FOLATE, FERRITIN, TIBC, IRON, RETICCTPCT in the last 72 hours. Sepsis Labs: No results for input(s): PROCALCITON, LATICACIDVEN in the last 168 hours.  Recent Results (from the past 240 hour(s))  Respiratory Panel by RT PCR (Flu A&B, Covid) - Nasopharyngeal Swab     Status: None   Collection Time: 12/05/19 10:04 PM   Specimen: Nasopharyngeal Swab  Result Value Ref Range Status   SARS Coronavirus 2 by RT PCR NEGATIVE NEGATIVE Final    Comment: (NOTE) SARS-CoV-2 target nucleic acids are NOT DETECTED.  The SARS-CoV-2 RNA is generally detectable in upper respiratoy specimens during the acute phase of infection. The lowest concentration of SARS-CoV-2 viral copies this assay can detect is 131 copies/mL. A negative result does not preclude SARS-Cov-2 infection and should not be used as the sole basis for treatment or other patient management decisions. A negative result may occur with  improper specimen collection/handling, submission of specimen other than nasopharyngeal swab, presence of viral mutation(s) within the areas targeted by this assay, and inadequate number of viral copies (<131 copies/mL). A negative result must be combined with clinical observations, patient history, and epidemiological information. The expected  result is Negative.  Fact Sheet for Patients:  PinkCheek.be  Fact Sheet for Healthcare Providers:  GravelBags.it  This test is no t yet approved or cleared by the [place] FDA and  has been authorized for detection and/or diagnosis of SARS-CoV-2 by FDA under an Emergency Use Authorization (EUA). This EUA will remain  in effect (meaning this test can be used) for the duration of the COVID-19 declaration under Section 564(b)(1) of the Act, 21 U.S.C. section 360bbb-3(b)(1), unless the authorization is terminated or revoked sooner.     Influenza A by PCR NEGATIVE NEGATIVE Final   Influenza B by PCR NEGATIVE NEGATIVE Final    Comment: (NOTE) The Xpert Xpress SARS-CoV-2/FLU/RSV assay is intended as an aid in  the diagnosis of influenza from Nasopharyngeal swab specimens and  should not be used as a sole basis for treatment. Nasal washings and  aspirates are unacceptable for Xpert Xpress SARS-CoV-2/FLU/RSV  testing.  Fact Sheet for Patients: PinkCheek.be  Fact Sheet for Healthcare Providers: GravelBags.it  This test is not yet approved or cleared by the [place] FDA and  has been authorized for detection and/or diagnosis of SARS-CoV-2 by  FDA under an Emergency Use Authorization (EUA). This EUA will remain  in effect (meaning this test can be used) for the duration of the  Covid-19 declaration under Section 564(b)(1) of the Act, 21  U.S.C. section 360bbb-3(b)(1), unless the authorization is  terminated or revoked. Performed at [facility], [address], [place], [place] [address]          Radiology Studies: No results found.      Scheduled Meds: . aspirin EC  81 mg Oral Daily  . collagenase   Topical Daily  . epoetin (EPOGEN/PROCRIT) injection  10,000 Units Intravenous Q T,Th,Sa-HD  . feeding supplement (NEPRO CARB STEADY)  237 mL  Oral BID BM  . ferrous sulfate  325 mg Oral BID  . heparin  5,000 Units Subcutaneous Q8H  . midodrine  10 mg Oral TID WC  . multivitamin  1 tablet Oral QHS  . pantoprazole  40 mg Oral BID  . patiromer  25.2 g Oral Daily  . predniSONE  10 mg Oral Daily  . sevelamer carbonate  800 mg Oral BID WC  . sodium bicarbonate  1,300 mg Oral BID  . traZODone  50 mg Oral QHS   Continuous Infusions: . albumin human 25 g (12/10/19 1516)     LOS: 6 days  Time spent: 15 minutes    Sidney Ace, MD [facility] Pager [phone]  If 7PM-7AM, please contact night-coverage 12/12/2019, 1:21 PM

## 2019-12-12 NOTE — TOC Progression Note (Addendum)
Transition of Care [facility]) - Progression Note    Patient Details  Name: [name] MRN: [id number] Date of Birth: [birth year]  Transition of Care [facility]) CM/SW [place], LCSW Phone Number: 12/12/2019, 9:59 AM  Clinical Narrative:   CSW called [facility], spoke to [place]. She confirmed they received my referral and said she will have a Liaison review then call CSW, should be today.   10:30- Call from [facility], he requested Facesheet and updated notes be faxed, CSW faxed these over. He reported we would have to set up for patient to get dialysis in [facility], but their faicility could provide transport to and form dialysis. He said he should have an answer by today or tomorrow on if they can accept patient. Updated Dialysis Coordinator [name] via voicemail of potential need.   11:25- Call from [place] asking if there is any way he can talk to patient, provided number for room phone. CSW informed patient to expect a call.  [place], spoke with Denny Peon who reported the process is to talk to one of their RNs first. [place] and spoke to [employer]. CSW faxed referral to her. She will review then call CSW with yes/no answer.    Expected Discharge Plan: [place] Barriers to Discharge: Continued Medical Work up  Expected Discharge Plan and Services Expected Discharge Plan: [place] In-house Referral: Clinical Social Work Discharge Planning Services: CM Consult Post Acute Care Choice: [place] arrangements for the past 2 months: Single Family Home                 DME Arranged: N/A DME Agency: NA         [place] Agency: [place] ([facility])         Social Determinants of Health (SDOH) Interventions    Readmission Risk Interventions Readmission Risk Prevention Plan 12/06/2019 11/24/2019  Transportation Screening Complete Complete  PCP or Specialist Appt within 3-5  Days - Complete  HRI or [place] Complete Complete  Social Work Consult for [place] Planning/Counseling Complete Complete  Palliative Care Screening Not Applicable Not Applicable  Medication Review Press photographer) - Referral to Pharmacy  Some recent data might be hidden

## 2019-12-13 ENCOUNTER — Encounter: Payer: No Typology Code available for payment source | Admitting: Registered Nurse

## 2019-12-13 DIAGNOSIS — G894 Chronic pain syndrome: Secondary | ICD-10-CM | POA: Insufficient documentation

## 2019-12-13 DIAGNOSIS — M25561 Pain in right knee: Secondary | ICD-10-CM | POA: Insufficient documentation

## 2019-12-13 DIAGNOSIS — M79672 Pain in left foot: Secondary | ICD-10-CM | POA: Insufficient documentation

## 2019-12-13 DIAGNOSIS — M25562 Pain in left knee: Secondary | ICD-10-CM | POA: Insufficient documentation

## 2019-12-13 DIAGNOSIS — M545 Low back pain, unspecified: Secondary | ICD-10-CM | POA: Insufficient documentation

## 2019-12-13 DIAGNOSIS — Z5181 Encounter for therapeutic drug level monitoring: Secondary | ICD-10-CM | POA: Insufficient documentation

## 2019-12-13 DIAGNOSIS — M2550 Pain in unspecified joint: Secondary | ICD-10-CM | POA: Insufficient documentation

## 2019-12-13 DIAGNOSIS — G8929 Other chronic pain: Secondary | ICD-10-CM | POA: Insufficient documentation

## 2019-12-13 DIAGNOSIS — L930 Discoid lupus erythematosus: Secondary | ICD-10-CM | POA: Insufficient documentation

## 2019-12-13 DIAGNOSIS — M79671 Pain in right foot: Secondary | ICD-10-CM | POA: Insufficient documentation

## 2019-12-13 DIAGNOSIS — Z79891 Long term (current) use of opiate analgesic: Secondary | ICD-10-CM | POA: Insufficient documentation

## 2019-12-13 LAB — CBC
HCT: 23 % — ABNORMAL LOW (ref 39.0–52.0)
Hemoglobin: 7.2 g/dL — ABNORMAL LOW (ref 13.0–17.0)
MCH: 29.9 pg (ref 26.0–34.0)
MCHC: 31.3 g/dL (ref 30.0–36.0)
MCV: 95.4 fL (ref 80.0–100.0)
Platelets: 117 10*3/uL — ABNORMAL LOW (ref 150–400)
RBC: 2.41 MIL/uL — ABNORMAL LOW (ref 4.22–5.81)
RDW: 17.2 % — ABNORMAL HIGH (ref 11.5–15.5)
WBC: 6.5 10*3/uL (ref 4.0–10.5)
nRBC: 0 % (ref 0.0–0.2)

## 2019-12-13 LAB — RENAL FUNCTION PANEL
Albumin: 2.8 g/dL — ABNORMAL LOW (ref 3.5–5.0)
Anion gap: 11 (ref 5–15)
BUN: 53 mg/dL — ABNORMAL HIGH (ref 6–20)
CO2: 29 mmol/L (ref 22–32)
Calcium: 8.9 mg/dL (ref 8.9–10.3)
Chloride: 96 mmol/L — ABNORMAL LOW (ref 98–111)
Creatinine, Ser: 6.54 mg/dL — ABNORMAL HIGH (ref 0.61–1.24)
GFR, Estimated: 10 mL/min — ABNORMAL LOW (ref >60)
Glucose, Bld: 105 mg/dL — ABNORMAL HIGH (ref 70–99)
Phosphorus: 6 mg/dL — ABNORMAL HIGH (ref 2.5–4.6)
Potassium: 6.3 mmol/L (ref 3.5–5.1)
Sodium: 136 mmol/L (ref 135–145)

## 2019-12-13 NOTE — Progress Notes (Signed)
Pt declined some of his medications for the night, education given on its importance. Also declined to wear his CPAP for the night- education given accordingly.

## 2019-12-13 NOTE — TOC Progression Note (Addendum)
Transition of Care [facility]) - Progression Note    Patient Details  Name: [name] MRN: [id number] Date of Birth: [birth year]  Transition of Care [facility]) CM/SW [place], LCSW Phone Number: 12/13/2019, 10:34 AM  Clinical Narrative:   Call from [place] at [facility]. He reported they can accept patient and already have [place] authorization. He reported they just need a dialysis bed to be confirmed in [facility]. He reported any dialysis center in [facility] is fine, they typically use [facility]. Patient is having dialysis at [facility] now, so would need a confirmed seat for Thursday. CSW updated Dialysis Coordinator Estill Bamberg who is working on finding a dialysis bed in [facility]. Updated MD and will update patient when he is back from dialysis.  [place], [place]  1:40- Call to Dialysis Coordinator Estill Bamberg, she sent referral to [facility] and is waiting to hear back. Estill Bamberg informed patient of updates. CSW updated Jace.  2:45- Call from Dialysis Coordinator Estill Bamberg, the only dialysis center in [facility] that may have a bed available is [facility] and they are supposed to let [place] know tomorrow. Patient will need a COVID test no more than 72 hours before going to dialysis there, so will need a COVID test tomorrow before discharge if they accept him. Updated Jace with Inpatient Rehab. CSW updated patient.   Expected Discharge Plan: [place] Barriers to Discharge: Continued Medical Work up  Expected Discharge Plan and Services Expected Discharge Plan: [place] In-house Referral: Clinical Social Work Discharge Planning Services: CM Consult Post Acute Care Choice: [place] arrangements for the past 2 months: Single Family Home                 DME Arranged: N/A DME Agency: NA         [place] Agency: [place]  ([facility])         Social Determinants of Health (SDOH) Interventions    Readmission Risk Interventions Readmission Risk Prevention Plan 12/06/2019 11/24/2019  Transportation Screening Complete Complete  PCP or Specialist Appt within 3-5 Days - Complete  HRI or [place] Complete Complete  Social Work Consult for [place] Planning/Counseling Complete Complete  Palliative Care Screening Not Applicable Not Applicable  Medication Review Press photographer) - Referral to Pharmacy  Some recent data might be hidden

## 2019-12-13 NOTE — Progress Notes (Signed)
PROGRESS NOTE    [name]  TKW:[id number] DOB: 1970/11/01 DOA: 12/05/2019 PCP: [facility]   Brief Narrative:  49 y.o. 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  He remains on IV meropenem.  No indication of sepsis or septic shock at this time.  Patient was only out of the hospital for a few hours.  Nephrology consulted.  Message sent to ID for recommendations.  Communicated with infectious disease.  Last day of antibiotics will be 12/09/2019.  Communicated this with patient.  He is understanding.  Plan to discharge to inpatient rehab facility once bed is found.  Although patient did leave AMA his previous admission this time he is motivated and actively seeking inpatient rehab facility.  I believe he is motivated to improve and recover and at this time risk of him leaving AMA is minimal.  11/19: Last day of antibiotics.  Had a long discussion with patient about disposition plan.  He continues to express his desire to go to inpatient rehab.  From my perspective he is motivated and wishes to improve.    In My clinical impression inpatient rehab would be the best environment which he can continue to recover.  I believe he is motivated and not a high risk of leaving [facility] despite his actions during this hospitalization.  He has been adherent to care and working physical therapy and every opportunity.  11/23: No clinical changes.  Patient has an inpatient rehab facility that may be willing to offer  him a bed.  Once we have confirmation that the will be able to transport him to an outpatient hemodialysis chair patient can discharge.   Assessment & Plan:   Principal Problem:   Hyperkalemia Active Problems:   Cellulitis of right lower extremity   ESRD on hemodialysis (HCC)   Systemic lupus erythematosus, unspecified ([place])   History of Renal transplant failure   Pseudomonas aeruginosa wound infection  Hyperkalemia ESRD on hemodialysis ([place]) with history of failed renal transplant -Patient received calcium gluconate and Veltassa in the emergency room HD 12/06/2019 HD  12/08/2019 HD 12/10/2019 HD 12/13/2019 Plan: Nephrology following HD today Check labs during hemodialysis Patient has been refusing his Veltassa   Pseudomonas aeruginosa wound infection Cellulitis of right lower extremity Wound right lower leg Communicated with infectious disease regarding antibiotic recommendations.  Patient completed his last dose of meropenem on 12/09/2019.  He has no further indication for systemic antibiotic therapy.  Continue wound care per WOCN recommendations.  Systemic lupus erythematosus, unspecified ([place]) Continue prednisone per home dose   DVT prophylaxis: Heparin subcutaneous Code Status: Full code Family Communication: None today Disposition Plan: Status is: Inpatient  Remains inpatient appropriate because:Unsafe d/c plan   Dispo: The patient is from: Home              Anticipated d/c is to: Inpatient rehab              Anticipated d/c date is: 1 day  Patient currently is medically stable to d/c.   Patient is medically stable for discharge.  He is completed his IV antibiotics in house.  No further need for antibiotic therapy on discharge.  Dialysis in house until appropriate disposition plan is found.  Per TOC the patient has an inpatient rehab facility that is willing to offer him a bed however before discharge we need to ensure that they will also be able to  transport him to an outpatient hemodialysis chair.  Anticipated date of discharge 12/14/2019   Consultants:   Nephrology  Procedures:   None  Antimicrobials:      Subjective: Patient seen and examined.  No pain complaints this morning.  Objective: Vitals:   12/13/19 1300 12/13/19 1315 12/13/19 1330 12/13/19 1345  BP: (!) 108/49 (!) 109/55 (!) 103/55 104/64  Pulse: (!) 51 62 61 (!) 51  Resp: 15 12 14 15   Temp:    98.5 F (36.9 C)  TempSrc:      SpO2: 100% 100% 100% 100%  Weight:      Height:        Intake/Output Summary (Last 24 hours) at 12/13/2019 1504 Last data filed at 12/13/2019 1009 Gross per 24 hour  Intake 480 ml  Output --  Net 480 ml   Filed Weights   12/05/19 1757  Weight: 95.3 kg    Examination:  General exam: Appears calm and comfortable  Respiratory system: Clear to auscultation. Respiratory effort normal. Cardiovascular system: S1 & S2 heard, RRR. No JVD, murmurs, rubs, gallops or clicks. No pedal edema. Gastrointestinal system: Abdomen is nondistended, soft and nontender. No organomegaly or masses felt. Normal bowel sounds heard. Central nervous system: Alert and oriented. No focal neurological deficits. Extremities: Symmetric 5 x 5 power.  LUE AVG + thrill Skin: Right shin wound, superficial Psychiatry: Judgement and insight appear normal. Mood & affect appropriate.     Data Reviewed: I have personally reviewed following labs and imaging studies  CBC: Recent Labs  Lab 12/07/19 0724 12/08/19 1045 12/13/19 1000  WBC 7.9 7.3 6.5  NEUTROABS 6.0 5.5  --   HGB 8.1* 7.4* 7.2*  HCT 25.7* 24.0* 23.0*  MCV 95.5 96.4 95.4  PLT 96* 92* 734*   Basic Metabolic Panel: Recent Labs  Lab 12/07/19 0724 12/08/19 1045 12/10/19 1035 12/13/19 1000  NA 136 137 132* 136  K 5.4* 5.9* 5.9* 6.3*  CL 95* 96* 94* 96*  CO2 29 29 27 29   GLUCOSE 73 126* 89 105*  BUN 38* 48* 46* 53*  CREATININE 4.78* 5.89* 5.72* 6.54*  CALCIUM 8.6* 8.7* 8.7* 8.9   MG 1.8  --   --   --   PHOS 5.2*  --  6.0* 6.0*   GFR: Estimated Creatinine Clearance: 15.7 mL/min (A) (by C-G formula based on SCr of 6.54 mg/dL (H)). Liver Function Tests: Recent Labs  Lab 12/07/19 0724 12/13/19 1000  ALBUMIN 2.4* 2.8*   No results for input(s): LIPASE, AMYLASE in the last 168 hours. No results for input(s): AMMONIA in the last 168 hours. Coagulation Profile: No results for input(s): INR, PROTIME in the last 168 hours. Cardiac Enzymes: No results for input(s): CKTOTAL, CKMB, CKMBINDEX, TROPONINI in the last 168 hours. BNP (last 3 results) No results for input(s): PROBNP in the last 8760 hours. HbA1C: No results for input(s): HGBA1C in the last 72 hours. CBG: No results for input(s): GLUCAP in the last 168 hours. Lipid Profile: No results for input(s): CHOL, HDL, LDLCALC, TRIG, CHOLHDL, LDLDIRECT in  the last 72 hours. Thyroid Function Tests: No results for input(s): TSH, T4TOTAL, FREET4, T3FREE, THYROIDAB in the last 72 hours. Anemia Panel: No results for input(s): VITAMINB12, FOLATE, FERRITIN, TIBC, IRON, RETICCTPCT in the last 72 hours. Sepsis Labs: No results for input(s): PROCALCITON, LATICACIDVEN in the last 168 hours.  Recent Results (from the past 240 hour(s))  Respiratory Panel by RT PCR (Flu A&B, Covid) - Nasopharyngeal Swab     Status: None   Collection Time: 12/05/19 10:04 PM   Specimen: Nasopharyngeal Swab  Result Value Ref Range Status   SARS Coronavirus 2 by RT PCR NEGATIVE NEGATIVE Final    Comment: (NOTE) SARS-CoV-2 target nucleic acids are NOT DETECTED.  The SARS-CoV-2 RNA is generally detectable in upper respiratoy specimens during the acute phase of infection. The lowest concentration of SARS-CoV-2 viral copies this assay can detect is 131 copies/mL. A negative result does not preclude SARS-Cov-2 infection and should not be used as the sole basis for treatment or other patient management decisions. A negative result may occur with   improper specimen collection/handling, submission of specimen other than nasopharyngeal swab, presence of viral mutation(s) within the areas targeted by this assay, and inadequate number of viral copies (<131 copies/mL). A negative result must be combined with clinical observations, patient history, and epidemiological information. The expected result is Negative.  Fact Sheet for Patients:  PinkCheek.be  Fact Sheet for Healthcare Providers:  GravelBags.it  This test is no t yet approved or cleared by the [place] FDA and  has been authorized for detection and/or diagnosis of SARS-CoV-2 by FDA under an Emergency Use Authorization (EUA). This EUA will remain  in effect (meaning this test can be used) for the duration of the COVID-19 declaration under Section 564(b)(1) of the Act, 21 U.S.C. section 360bbb-3(b)(1), unless the authorization is terminated or revoked sooner.     Influenza A by PCR NEGATIVE NEGATIVE Final   Influenza B by PCR NEGATIVE NEGATIVE Final    Comment: (NOTE) The Xpert Xpress SARS-CoV-2/FLU/RSV assay is intended as an aid in  the diagnosis of influenza from Nasopharyngeal swab specimens and  should not be used as a sole basis for treatment. Nasal washings and  aspirates are unacceptable for Xpert Xpress SARS-CoV-2/FLU/RSV  testing.  Fact Sheet for Patients: PinkCheek.be  Fact Sheet for Healthcare Providers: GravelBags.it  This test is not yet approved or cleared by the [place] FDA and  has been authorized for detection and/or diagnosis of SARS-CoV-2 by  FDA under an Emergency Use Authorization (EUA). This EUA will remain  in effect (meaning this test can be used) for the duration of the  Covid-19 declaration under Section 564(b)(1) of the Act, 21  U.S.C. section 360bbb-3(b)(1), unless the authorization is  terminated or  revoked. Performed at [facility], [address], [place], [place] [address]          Radiology Studies: No results found.      Scheduled Meds: . aspirin EC  81 mg Oral Daily  . collagenase   Topical Daily  . epoetin (EPOGEN/PROCRIT) injection  10,000 Units Intravenous Q T,Th,Sa-HD  . feeding supplement (NEPRO CARB STEADY)  237 mL Oral BID BM  . ferrous sulfate  325 mg Oral BID  . heparin  5,000 Units Subcutaneous Q8H  . midodrine  10 mg Oral TID WC  . multivitamin  1 tablet Oral QHS  . pantoprazole  40 mg Oral BID  . patiromer  25.2 g Oral Daily  . predniSONE  10 mg Oral Daily  . sevelamer carbonate  800 mg Oral BID WC  . sodium bicarbonate  1,300 mg Oral BID  . traZODone  50 mg Oral QHS   Continuous Infusions: . albumin human 25 g (12/13/19 1021)     LOS: 7 days    Time spent: 15 minutes    Sidney Ace, MD [facility] Pager [phone]  If 7PM-7AM, please contact night-coverage 12/13/2019, 3:04 PM

## 2019-12-13 NOTE — Progress Notes (Signed)
PT Cancellation Note  Patient Details Name: [name] MRN: [id number] DOB: 09/10/1970   Cancelled Treatment:    Reason Eval/Treat Not Completed: Patient at procedure or test/unavailable. Patient is off the floor at dialysis. Also noted potassium is 6.3 this date. PT will continue with attempts as appropriate.   [name], PT, MPT  [name] 12/13/2019, 2:26 PM

## 2019-12-13 NOTE — Progress Notes (Signed)
[facility]  ROUNDING NOTE   Subjective:    Patient seen in dialysis today, receiving treatment and tolerating well. Patient's stays hyperkalemic, reports not taking his Veltassa.We will dialyze him today with low K+ bath.   HEMODIALYSIS FLOWSHEET:  Blood Flow Rate (mL/min): 400 mL/min Arterial Pressure (mmHg): -140 mmHg Venous Pressure (mmHg): 150 mmHg Transmembrane Pressure (mmHg): 80 mmHg Ultrafiltration Rate (mL/min): 720 mL/min Dialysate Flow Rate (mL/min): 600 ml/min Conductivity: Machine : 13.8 Conductivity: Machine : 13.8 Dialysis Fluid Bolus: Normal Saline Bolus Amount (mL): 300 mL       Objective:  Vital signs in last 24 hours:  Temp:  [98.1 F (36.7 C)-98.4 F (36.9 C)] 98.1 F (36.7 C) (11/23 1000) Pulse Rate:  [53-66] 56 (11/23 1145) Resp:  [13-19] 15 (11/23 1145) BP: (93-118)/(47-71) 103/50 (11/23 1145) SpO2:  [100 %] 100 % (11/23 1145)  Weight change:  Filed Weights   12/05/19 1757  Weight: 95.3 kg    Intake/Output: I/O last 3 completed shifts: In: 1160 [P.O.:960; IV Piggyback:200] Out: 0    Intake/Output this shift:  Total I/O In: 480 [P.O.:480] Out: -   Physical Exam: General:  In no acute distress  Head:  Normocephalic,atraumatic  Eyes: Anicteric  Lungs:   lungs clear,normal effort  Heart:  Irregular rhythm,HR in 60's  Abdomen:  Soft, non tender,non distended  Extremities:  1+ peripheral edema  Neurologic: Oriented x3  Skin: Rt leg with dressing clean,dry and intact  Access: Left AVG +bruit,+thrill    Basic Metabolic Panel: Recent Labs  Lab 12/07/19 0724 12/07/19 0724 12/08/19 1045 12/10/19 1035 12/13/19 1000  NA 136  --  137 132* 136  K 5.4*  --  5.9* 5.9* 6.3*  CL 95*  --  96* 94* 96*  CO2 29  --  29 27 29   GLUCOSE 73  --  126* 89 105*  BUN 38*  --  48* 46* 53*  CREATININE 4.78*  --  5.89* 5.72* 6.54*  CALCIUM 8.6*   < > 8.7* 8.7* 8.9  MG 1.8  --   --   --   --   PHOS 5.2*  --   --  6.0* 6.0*   < > =  values in this interval not displayed.    Liver Function Tests: Recent Labs  Lab 12/07/19 0724 12/13/19 1000  ALBUMIN 2.4* 2.8*   No results for input(s): LIPASE, AMYLASE in the last 168 hours. No results for input(s): AMMONIA in the last 168 hours.  CBC: Recent Labs  Lab 12/07/19 0724 12/08/19 1045 12/13/19 1000  WBC 7.9 7.3 6.5  NEUTROABS 6.0 5.5  --   HGB 8.1* 7.4* 7.2*  HCT 25.7* 24.0* 23.0*  MCV 95.5 96.4 95.4  PLT 96* 92* 117*    Cardiac Enzymes: No results for input(s): CKTOTAL, CKMB, CKMBINDEX, TROPONINI in the last 168 hours.  BNP: Invalid input(s): POCBNP  CBG: No results for input(s): GLUCAP in the last 168 hours.  Microbiology: Results for orders placed or performed during the hospital encounter of 12/05/19  Respiratory Panel by RT PCR (Flu A&B, Covid) - Nasopharyngeal Swab     Status: None   Collection Time: 12/05/19 10:04 PM   Specimen: Nasopharyngeal Swab  Result Value Ref Range Status   SARS Coronavirus 2 by RT PCR NEGATIVE NEGATIVE Final    Comment: (NOTE) SARS-CoV-2 target nucleic acids are NOT DETECTED.  The SARS-CoV-2 RNA is generally detectable in upper respiratoy specimens during the acute phase of infection. The lowest concentration of  SARS-CoV-2 viral copies this assay can detect is 131 copies/mL. A negative result does not preclude SARS-Cov-2 infection and should not be used as the sole basis for treatment or other patient management decisions. A negative result may occur with  improper specimen collection/handling, submission of specimen other than nasopharyngeal swab, presence of viral mutation(s) within the areas targeted by this assay, and inadequate number of viral copies (<131 copies/mL). A negative result must be combined with clinical observations, patient history, and epidemiological information. The expected result is Negative.  Fact Sheet for Patients:  PinkCheek.be  Fact Sheet for  Healthcare Providers:  GravelBags.it  This test is no t yet approved or cleared by the [place] FDA and  has been authorized for detection and/or diagnosis of SARS-CoV-2 by FDA under an Emergency Use Authorization (EUA). This EUA will remain  in effect (meaning this test can be used) for the duration of the COVID-19 declaration under Section 564(b)(1) of the Act, 21 U.S.C. section 360bbb-3(b)(1), unless the authorization is terminated or revoked sooner.     Influenza A by PCR NEGATIVE NEGATIVE Final   Influenza B by PCR NEGATIVE NEGATIVE Final    Comment: (NOTE) The Xpert Xpress SARS-CoV-2/FLU/RSV assay is intended as an aid in  the diagnosis of influenza from Nasopharyngeal swab specimens and  should not be used as a sole basis for treatment. Nasal washings and  aspirates are unacceptable for Xpert Xpress SARS-CoV-2/FLU/RSV  testing.  Fact Sheet for Patients: PinkCheek.be  Fact Sheet for Healthcare Providers: GravelBags.it  This test is not yet approved or cleared by the [place] FDA and  has been authorized for detection and/or diagnosis of SARS-CoV-2 by  FDA under an Emergency Use Authorization (EUA). This EUA will remain  in effect (meaning this test can be used) for the duration of the  Covid-19 declaration under Section 564(b)(1) of the Act, 21  U.S.C. section 360bbb-3(b)(1), unless the authorization is  terminated or revoked. Performed at [facility], [address] [place], [place] [address]     Coagulation Studies: No results for input(s): LABPROT, INR in the last 72 hours.  Urinalysis: No results for input(s): COLORURINE, LABSPEC, PHURINE, GLUCOSEU, HGBUR, BILIRUBINUR, KETONESUR, PROTEINUR, UROBILINOGEN, NITRITE, LEUKOCYTESUR in the last 72 hours.  Invalid input(s): APPERANCEUR    Imaging: No results found.   Medications:   . albumin human 25 g  (12/13/19 1021)   . aspirin EC  81 mg Oral Daily  . collagenase   Topical Daily  . epoetin (EPOGEN/PROCRIT) injection  10,000 Units Intravenous Q T,Th,Sa-HD  . feeding supplement (NEPRO CARB STEADY)  237 mL Oral BID BM  . ferrous sulfate  325 mg Oral BID  . heparin  5,000 Units Subcutaneous Q8H  . midodrine  10 mg Oral TID WC  . multivitamin  1 tablet Oral QHS  . pantoprazole  40 mg Oral BID  . patiromer  25.2 g Oral Daily  . predniSONE  10 mg Oral Daily  . sevelamer carbonate  800 mg Oral BID WC  . sodium bicarbonate  1,300 mg Oral BID  . traZODone  50 mg Oral QHS   acetaminophen **OR** acetaminophen, calcium carbonate, loperamide, ondansetron **OR** ondansetron (ZOFRAN) IV, oxyCODONE, promethazine  Assessment/ Plan:  [name] is a 49 y.o. black male with end stage renal disease on hemodialysis, history of kidney transplant, lupus nephritis, and hypotension who was admitted to [facility] on 12/05/2019 for Hyperkalemia [E87.5] Cellulitis of right lower extremity [T01.601]  [facility] [place]/  Left AVG/TTS  # End stage renal disease.  Patient receiving dialysis treatment today Tolerating well Will continue TTS schedule  #Anemia with chronic kidney disease.   Lab Results  Component Value Date   HGB 7.2 (L) 12/13/2019  Continue Epogen with dialysis   #Secondary Hyperparathyroidism Lab Results  Component Value Date   CALCIUM 8.9 12/13/2019   PHOS 6.0 (H) 12/13/2019  Will continue monitoring bone mineral metabolism parameters On Renvela BID #Hyperkalemia Hyperkalemia worse to 6.3 today Getting dialyzed with low K+bath Reports not taking Patiromer as prescribed   LOS: 7 Kess Mcilwain 11/23/202111:52 AM  I

## 2019-12-13 NOTE — Progress Notes (Signed)
Pt still refusing most of his meds.

## 2019-12-13 NOTE — Progress Notes (Signed)
OT Cancellation Note  Patient Details Name: SHIVAM MESTAS MRN: [id number] DOB: 1970/08/12   Cancelled Treatment:    Reason Eval/Treat Not Completed: Patient at procedure or test/ unavailable. OT attempt but pt currently at dialysis. OT will re-attempt when pt is next available.   Darleen Crocker, MS, OTR/L , CBIS ascom [phone]  12/13/19, 1:41 PM  12/13/2019, 1:40 PM

## 2019-12-13 NOTE — Progress Notes (Signed)
Pt still refusing Heparin.  Education of heparin explained to pt.

## 2019-12-14 NOTE — Progress Notes (Signed)
Pt weaned down to 2LO2 and 100% O2 sats.  Pt still refusing heparin and certain medications.

## 2019-12-14 NOTE — Progress Notes (Signed)
OT Cancellation Note  Patient Details Name: [name] MRN: [id number] DOB: 11/12/1970   Cancelled Treatment:    Reason Eval/Treat Not Completed: Medical issues which prohibited therapy. Chart reviewed. Most recent labs indicating Hgb 7.2, K+ 6.3. Contraindicated for participation in therapy. Will continue to follow and treat as pt is medically appropriate to participate.   Jeni Salles, MPH, MS, OTR/L ascom [phone] 12/14/19, 7:52 AM

## 2019-12-14 NOTE — Progress Notes (Signed)
[facility]  ROUNDING NOTE   Subjective:     Patient resting in bed,appears comfortable.He received dialysis treatment yesterday, tolerated well.   Discharge planning and outpatient dialysis arrangements in process.       Objective:  Vital signs in last 24 hours:  Temp:  [97.7 F (36.5 C)-98.5 F (36.9 C)] 98.1 F (36.7 C) (11/24 1157) Pulse Rate:  [51-74] 68 (11/24 1157) Resp:  [15-17] 16 (11/24 1157) BP: (89-104)/(44-64) 104/58 (11/24 1157) SpO2:  [93 %-100 %] 100 % (11/24 1210)  Weight change:  Filed Weights   12/05/19 1757  Weight: 95.3 kg    Intake/Output: I/O last 3 completed shifts: In: 720 [P.O.:720] Out: 2000 [Other:2000]   Intake/Output this shift:  Total I/O In: 120 [P.O.:120] Out: -   Physical Exam: General:  Resting in bed, appears comfortable  Head:  Oral mucous membranes moist  Eyes:  Sclerae and conjunctivae clear  Lungs:   l respirations even, unlabored, lungs clear  Heart:  S1-S2, no rubs or gallops  Abdomen:  Soft, non tender,non distended  Extremities:  Lower extremity edema 1+  Neurologic:  Awake, alert, oriented  Skin: Rt leg with dressing clean,dry and intact  Access: Left AVG +bruit,+thrill    Basic Metabolic Panel: Recent Labs  Lab 12/08/19 1045 12/10/19 1035 12/13/19 1000  NA 137 132* 136  K 5.9* 5.9* 6.3*  CL 96* 94* 96*  CO2 29 27 29   GLUCOSE 126* 89 105*  BUN 48* 46* 53*  CREATININE 5.89* 5.72* 6.54*  CALCIUM 8.7* 8.7* 8.9  PHOS  --  6.0* 6.0*    Liver Function Tests: Recent Labs  Lab 12/13/19 1000  ALBUMIN 2.8*   No results for input(s): LIPASE, AMYLASE in the last 168 hours. No results for input(s): AMMONIA in the last 168 hours.  CBC: Recent Labs  Lab 12/08/19 1045 12/13/19 1000  WBC 7.3 6.5  NEUTROABS 5.5  --   HGB 7.4* 7.2*  HCT 24.0* 23.0*  MCV 96.4 95.4  PLT 92* 117*    Cardiac Enzymes: No results for input(s): CKTOTAL, CKMB, CKMBINDEX, TROPONINI in the last 168  hours.  BNP: Invalid input(s): POCBNP  CBG: No results for input(s): GLUCAP in the last 168 hours.  Microbiology: Results for orders placed or performed during the hospital encounter of 12/05/19  Respiratory Panel by RT PCR (Flu A&B, Covid) - Nasopharyngeal Swab     Status: None   Collection Time: 12/05/19 10:04 PM   Specimen: Nasopharyngeal Swab  Result Value Ref Range Status   SARS Coronavirus 2 by RT PCR NEGATIVE NEGATIVE Final    Comment: (NOTE) SARS-CoV-2 target nucleic acids are NOT DETECTED.  The SARS-CoV-2 RNA is generally detectable in upper respiratoy specimens during the acute phase of infection. The lowest concentration of SARS-CoV-2 viral copies this assay can detect is 131 copies/mL. A negative result does not preclude SARS-Cov-2 infection and should not be used as the sole basis for treatment or other patient management decisions. A negative result may occur with  improper specimen collection/handling, submission of specimen other than nasopharyngeal swab, presence of viral mutation(s) within the areas targeted by this assay, and inadequate number of viral copies (<131 copies/mL). A negative result must be combined with clinical observations, patient history, and epidemiological information. The expected result is Negative.  Fact Sheet for Patients:  PinkCheek.be  Fact Sheet for Healthcare Providers:  GravelBags.it  This test is no t yet approved or cleared by the [place] FDA and  has been  authorized for detection and/or diagnosis of SARS-CoV-2 by FDA under an Emergency Use Authorization (EUA). This EUA will remain  in effect (meaning this test can be used) for the duration of the COVID-19 declaration under Section 564(b)(1) of the Act, 21 U.S.C. section 360bbb-3(b)(1), unless the authorization is terminated or revoked sooner.     Influenza A by PCR NEGATIVE NEGATIVE Final   Influenza B by PCR  NEGATIVE NEGATIVE Final    Comment: (NOTE) The Xpert Xpress SARS-CoV-2/FLU/RSV assay is intended as an aid in  the diagnosis of influenza from Nasopharyngeal swab specimens and  should not be used as a sole basis for treatment. Nasal washings and  aspirates are unacceptable for Xpert Xpress SARS-CoV-2/FLU/RSV  testing.  Fact Sheet for Patients: PinkCheek.be  Fact Sheet for Healthcare Providers: GravelBags.it  This test is not yet approved or cleared by the [place] FDA and  has been authorized for detection and/or diagnosis of SARS-CoV-2 by  FDA under an Emergency Use Authorization (EUA). This EUA will remain  in effect (meaning this test can be used) for the duration of the  Covid-19 declaration under Section 564(b)(1) of the Act, 21  U.S.C. section 360bbb-3(b)(1), unless the authorization is  terminated or revoked. Performed at [facility], [address] [place], [place] [address]     Coagulation Studies: No results for input(s): LABPROT, INR in the last 72 hours.  Urinalysis: No results for input(s): COLORURINE, LABSPEC, PHURINE, GLUCOSEU, HGBUR, BILIRUBINUR, KETONESUR, PROTEINUR, UROBILINOGEN, NITRITE, LEUKOCYTESUR in the last 72 hours.  Invalid input(s): APPERANCEUR    Imaging: No results found.   Medications:   . albumin human 25 g (12/13/19 1021)   . aspirin EC  81 mg Oral Daily  . collagenase   Topical Daily  . epoetin (EPOGEN/PROCRIT) injection  10,000 Units Intravenous Q T,Th,Sa-HD  . feeding supplement (NEPRO CARB STEADY)  237 mL Oral BID BM  . ferrous sulfate  325 mg Oral BID  . heparin  5,000 Units Subcutaneous Q8H  . midodrine  10 mg Oral TID WC  . multivitamin  1 tablet Oral QHS  . pantoprazole  40 mg Oral BID  . patiromer  25.2 g Oral Daily  . predniSONE  10 mg Oral Daily  . sevelamer carbonate  800 mg Oral BID WC  . sodium bicarbonate  1,300 mg Oral BID  . traZODone  50  mg Oral QHS   acetaminophen **OR** acetaminophen, calcium carbonate, loperamide, ondansetron **OR** ondansetron (ZOFRAN) IV, oxyCODONE, promethazine  Assessment/ Plan:  [name] is a 49 y.o. black male with end stage renal disease on hemodialysis, history of kidney transplant, lupus nephritis, and hypotension who was admitted to [facility] on 12/05/2019 for Hyperkalemia [E87.5] Cellulitis of right lower extremity [X32.440]  [facility] [place]/ Left AVG/TTS  # End stage renal disease.  Patient received dialysis since yesterday, tolerated well Volume status is acceptable No need for additional dialysis today We will plan to dialyze him on TTS schedule   #Anemia with chronic kidney disease.   Lab Results  Component Value Date   HGB 7.2 (L) 12/13/2019  Epogen with dialysis treatments  #Secondary Hyperparathyroidism Lab Results  Component Value Date   CALCIUM 8.9 12/13/2019   PHOS 6.0 (H) 12/13/2019  Continue Sevelamer  #Hyperkalemia Patient's potassium level was 6.3 yesterday, dialyzed with low K potassium bath Patient continues to refuse Veltassa,despite counseling multiple times  We will continue monitoring electrolyte levels   LOS: 8 Levonia Wolfley 11/24/20211:31 PM  I

## 2019-12-14 NOTE — TOC Progression Note (Addendum)
Transition of Care [facility]) - Progression Note    Patient Details  Name: [name] MRN: [id number] Date of Birth: February 14, 1970  Transition of Care [facility]) CM/SW Contact  Shelbie Ammons, RN Phone Number: 12/14/2019, 10:52 AM  Clinical Narrative:   RNCM received word from [name] that patient will not be able to start outpatient in [facility] until next Tuesday 11/30. Patient will need to have dialysis here on Saturday prior to leaving. He will also need a Covid within 72 hours of starting new outpatient dialysis so it does not need to be done any earlier than Saturday.  RNCM reached out to [facility] with [facility] and he reported he would need to check into whether patient's authorization is good that long. He will follow up.   11:30: Per [facility] should be fine as long as patient transfers on Saturday. He requests transport be set up in advance if possible.   3pm: RNCM reached out to both [facility] and [facility] in attempt to arrange transport for patient for Saturday. [facility] is unable to accept [name] and [facility] stated that they have a skeleton crew and will not be able to schedule in advance. RNCM reached out to [facility] to notify him that transport couldn't be arranged in advance. He reported that one of his co-workers will follow up Saturday.     Expected Discharge Plan: [name] Barriers to Discharge: Continued Medical Work up  Expected Discharge Plan and Services Expected Discharge Plan: [place] In-house Referral: Clinical Social Work Discharge Planning Services: CM Consult Post Acute Care Choice: [place] arrangements for the past 2 months: Single Family Home                 DME Arranged: N/A DME Agency: NA         [place] Agency: [place] ([facility])         Social Determinants of Health (SDOH) Interventions    Readmission Risk Interventions Readmission Risk Prevention Plan 12/06/2019 11/24/2019   Transportation Screening Complete Complete  PCP or Specialist Appt within 3-5 Days - Complete  HRI or [place] Complete Complete  Social Work Consult for [place] Planning/Counseling Complete Complete  Palliative Care Screening Not Applicable Not Applicable  Medication Review Press [employer]) - Referral to Pharmacy  Some recent data might be hidden

## 2019-12-14 NOTE — Progress Notes (Signed)
Notified yesterday that patient will require a clinic transfer to accommodate rehab location. Working on placement with [facility] to set up chair time at their [facility] clinic in [place]. Currently pending insurance verification. However, Admissions did say after cleared he would not be able to start until Tuesday 11/30. Please contact for any questions or updates.  [name] Dialysis Coordinator [phone]

## 2019-12-14 NOTE — Progress Notes (Signed)
PROGRESS NOTE   [name]  JFH:[id number]    DOB: 1970-08-10    DOA: 12/05/2019  PCP: [place]   I have briefly reviewed patients previous medical records in [facility].  Chief Complaint  Patient presents with  . Wound Infection  . 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  His case was communicated with ID and completed antibiotics on 12/09/2019.  Patient appears motivated to go to inpatient rehab as per discussion with [facility] team, the earliest he can go to rehab will be after dialysis on Saturday 11/27 because he cannot start outpatient HD until 11/30.  Covid test to be ordered for early morning on 11/27.   Assessment & Plan:  Principal Problem:   Hyperkalemia Active Problems:   Cellulitis of right lower extremity   ESRD on hemodialysis (HCC)   Systemic lupus erythematosus, unspecified ([place])   History of Renal transplant failure   Pseudomonas aeruginosa wound infection   Hyperkalemia ESRD on hemodialysis (HCC)with history of failed renal transplant -Patient received calcium gluconate and Veltassa in the emergency room -Nephrology following and patient undergoing regular HD. -Current plans are for HD on 11/25 and then on 11/27 before discharging to rehab and to start outpatient HD on 11/30. -Patient has been consistently refusing to take his Veltassa despite explaining to him in detail the risks of hyperkalemia including life-threatening cardiac arrhythmias and  death. -I discussed with nephrology team today who indicated that he likely has persistently high potassiums and no need to repeat BMP this morning. -Potassium was 6.3 on 11/23 was predialysis before low K bath  Pseudomonas aeruginosa wound infection Cellulitis of right lower extremity Wound right lower leg Prior to [facility] MD communicated with infectious disease regarding antibiotic recommendations.  Patient completed his last dose of meropenem on 12/09/2019.  He has no further indication for systemic antibiotic therapy.  Continue wound care per WOCN recommendations.  Systemic lupus erythematosus, unspecified ([place]) Continue prednisone per home dose  Body mass index is 26.96 kg/m.   Anemia with chronic kidney disease Follow CBC at HD in a.m. and consider transfusing if hemoglobin 7 g or less.  Thrombocytopenia: Appears chronic and intermittent.  Stable.  Secondary hyperparathyroidism Management per nephrology.  Continue sevelamer.   DVT prophylaxis: heparin injection 5,000 Units Start: 12/05/19 2245     Code Status: Full Code Family Communication: None at bedside. Disposition:  Status is: Inpatient  Remains inpatient appropriate because:Inpatient level of care appropriate due to severity of illness   Dispo: The patient is from: Home              Anticipated d/c is to: SNF              Anticipated d/c date is: 3 days              Patient currently is not medically stable to d/c.        Consultants:   Nephrology  Procedures:   HD  Antimicrobials:    Anti-infectives (From admission, onward)  Start     Dose/Rate Route Frequency Ordered Stop   12/07/19 0000  meropenem (MERREM) 500 mg in sodium chloride 0.9 % 100 mL IVPB        500 mg 200 mL/hr over 30 Minutes Intravenous Every 24 hours 12/06/19 1035 12/09/19 0020   12/05/19 2330  meropenem (MERREM) 500 mg in sodium chloride 0.9 % 100 mL IVPB  Status:  Discontinued        500 mg 200 mL/hr over 30 Minutes  Intravenous Every 24 hours 12/05/19 2315 12/06/19 1035   12/05/19 2315  meropenem (MERREM) 500 mg in sodium chloride 0.9 % 100 mL IVPB  Status:  Discontinued        500 mg 200 mL/hr over 30 Minutes Intravenous Every 12 hours 12/05/19 2312 12/05/19 2315   12/05/19 2215  meropenem (MERREM) 500 mg in sodium chloride 0.9 % 100 mL IVPB  Status:  Discontinued        500 mg 200 mL/hr over 30 Minutes Intravenous  Once 12/05/19 2202 12/05/19 2203        Subjective:  Denies complaints.  Continues to refuse Veltassa and states that he will only take medications that he was on prior to admission even though I mentioned to him that this may be only a temporary medication due to his hyperkalemia and serious life-threatening risks of persistent hypokalemia.  Objective:   Vitals:   12/13/19 1650 12/13/19 2358 12/14/19 1157 12/14/19 1210  BP: (!) 89/44 (!) 101/47 (!) 104/58   Pulse: 71 74 68   Resp: 15 17 16    Temp: 98.5 F (36.9 C) 97.7 F (36.5 C) 98.1 F (36.7 C)   TempSrc: Oral Oral Oral   SpO2: 100% 93% 100% 100%  Weight:      Height:        General exam: Young male, moderately built and nourished lying comfortably propped up in bed without distress. Respiratory system: Clear to auscultation. Respiratory effort normal. Cardiovascular system: S1 & S2 heard, RRR. No JVD, murmurs, rubs, gallops or clicks. No pedal edema. Gastrointestinal system: Abdomen is nondistended, soft and nontender. No organomegaly or masses felt. Normal bowel sounds heard. Central nervous system: Alert and oriented. No focal neurological deficits. Extremities: Symmetric 5 x 5 power.  Dressing over right anterior mid shin clean and dry.  Left upper arm AV fistula with good thrill. Skin: No rashes, lesions or ulcers Psychiatry: Judgement and insight appear normal. Mood & affect appropriate.     Data Reviewed:   I have personally reviewed following labs and imaging studies   CBC: Recent Labs  Lab 12/08/19 1045  12/13/19 1000  WBC 7.3 6.5  NEUTROABS 5.5  --   HGB 7.4* 7.2*  HCT 24.0* 23.0*  MCV 96.4 95.4  PLT 92* 117*    Basic Metabolic Panel: Recent Labs  Lab 12/08/19 1045 12/10/19 1035 12/13/19 1000  NA 137 132* 136  K 5.9* 5.9* 6.3*  CL 96* 94* 96*  CO2 29 27 29   GLUCOSE 126* 89 105*  BUN 48* 46* 53*  CREATININE 5.89* 5.72* 6.54*  CALCIUM 8.7* 8.7* 8.9  PHOS  --  6.0* 6.0*    Liver Function Tests: Recent Labs  Lab 12/13/19 1000  ALBUMIN 2.8*    CBG: No results for input(s): GLUCAP in the last 168 hours.  Microbiology Studies:   Recent Results (from the past 240 hour(s))  Respiratory Panel by RT PCR (Flu A&B, Covid) - Nasopharyngeal Swab     Status: None  Collection Time: 12/05/19 10:04 PM   Specimen: Nasopharyngeal Swab  Result Value Ref Range Status   SARS Coronavirus 2 by RT PCR NEGATIVE NEGATIVE Final    Comment: (NOTE) SARS-CoV-2 target nucleic acids are NOT DETECTED.  The SARS-CoV-2 RNA is generally detectable in upper respiratoy specimens during the acute phase of infection. The lowest concentration of SARS-CoV-2 viral copies this assay can detect is 131 copies/mL. A negative result does not preclude SARS-Cov-2 infection and should not be used as the sole basis for treatment or other patient management decisions. A negative result may occur with  improper specimen collection/handling, submission of specimen other than nasopharyngeal swab, presence of viral mutation(s) within the areas targeted by this assay, and inadequate number of viral copies (<131 copies/mL). A negative result must be combined with clinical observations, patient history, and epidemiological information. The expected result is Negative.  Fact Sheet for Patients:  PinkCheek.be  Fact Sheet for Healthcare Providers:  GravelBags.it  This test is no t yet approved or cleared by the [place] FDA and  has been authorized  for detection and/or diagnosis of SARS-CoV-2 by FDA under an Emergency Use Authorization (EUA). This EUA will remain  in effect (meaning this test can be used) for the duration of the COVID-19 declaration under Section 564(b)(1) of the Act, 21 U.S.C. section 360bbb-3(b)(1), unless the authorization is terminated or revoked sooner.     Influenza A by PCR NEGATIVE NEGATIVE Final   Influenza B by PCR NEGATIVE NEGATIVE Final    Comment: (NOTE) The Xpert Xpress SARS-CoV-2/FLU/RSV assay is intended as an aid in  the diagnosis of influenza from Nasopharyngeal swab specimens and  should not be used as a sole basis for treatment. Nasal washings and  aspirates are unacceptable for Xpert Xpress SARS-CoV-2/FLU/RSV  testing.  Fact Sheet for Patients: PinkCheek.be  Fact Sheet for Healthcare Providers: GravelBags.it  This test is not yet approved or cleared by the [place] FDA and  has been authorized for detection and/or diagnosis of SARS-CoV-2 by  FDA under an Emergency Use Authorization (EUA). This EUA will remain  in effect (meaning this test can be used) for the duration of the  Covid-19 declaration under Section 564(b)(1) of the Act, 21  U.S.C. section 360bbb-3(b)(1), unless the authorization is  terminated or revoked. Performed at [facility], [address], [place], [place] [address]      Radiology Studies:  No results found.   Scheduled Meds:   . aspirin EC  81 mg Oral Daily  . collagenase   Topical Daily  . epoetin (EPOGEN/PROCRIT) injection  10,000 Units Intravenous Q T,Th,Sa-HD  . feeding supplement (NEPRO CARB STEADY)  237 mL Oral BID BM  . ferrous sulfate  325 mg Oral BID  . heparin  5,000 Units Subcutaneous Q8H  . midodrine  10 mg Oral TID WC  . multivitamin  1 tablet Oral QHS  . pantoprazole  40 mg Oral BID  . patiromer  25.2 g Oral Daily  . predniSONE  10 mg Oral Daily  . sevelamer  carbonate  800 mg Oral BID WC  . sodium bicarbonate  1,300 mg Oral BID  . traZODone  50 mg Oral QHS    Continuous Infusions:   . albumin human 25 g (12/13/19 1021)     LOS: 8 days     Vernell Leep, MD, [place], [facility]. [facility]    To contact the attending provider between 7A-7P or the covering provider during after hours 7P-7A, please log  into the web site [email or web address] and access using universal [place] password for that web site. If you do not have the password, please call the hospital operator.  12/14/2019, 4:41 PM

## 2019-12-15 LAB — CBC
HCT: 24.3 % — ABNORMAL LOW (ref 39.0–52.0)
Hemoglobin: 7.5 g/dL — ABNORMAL LOW (ref 13.0–17.0)
MCH: 29.6 pg (ref 26.0–34.0)
MCHC: 30.9 g/dL (ref 30.0–36.0)
MCV: 96 fL (ref 80.0–100.0)
Platelets: 130 10*3/uL — ABNORMAL LOW (ref 150–400)
RBC: 2.53 MIL/uL — ABNORMAL LOW (ref 4.22–5.81)
RDW: 17.2 % — ABNORMAL HIGH (ref 11.5–15.5)
WBC: 5.5 10*3/uL (ref 4.0–10.5)
nRBC: 0 % (ref 0.0–0.2)

## 2019-12-15 LAB — RENAL FUNCTION PANEL
Albumin: 2.9 g/dL — ABNORMAL LOW (ref 3.5–5.0)
Anion gap: 13 (ref 5–15)
BUN: 43 mg/dL — ABNORMAL HIGH (ref 6–20)
CO2: 29 mmol/L (ref 22–32)
Calcium: 9.3 mg/dL (ref 8.9–10.3)
Chloride: 96 mmol/L — ABNORMAL LOW (ref 98–111)
Creatinine, Ser: 5.67 mg/dL — ABNORMAL HIGH (ref 0.61–1.24)
GFR, Estimated: 11 mL/min — ABNORMAL LOW (ref >60)
Glucose, Bld: 108 mg/dL — ABNORMAL HIGH (ref 70–99)
Phosphorus: 4.9 mg/dL — ABNORMAL HIGH (ref 2.5–4.6)
Potassium: 5.6 mmol/L — ABNORMAL HIGH (ref 3.5–5.1)
Sodium: 138 mmol/L (ref 135–145)

## 2019-12-15 LAB — DIFFERENTIAL
Abs Immature Granulocytes: 0.02 10*3/uL (ref 0.00–0.07)
Basophils Absolute: 0 10*3/uL (ref 0.0–0.1)
Basophils Relative: 1 %
Eosinophils Absolute: 0.2 10*3/uL (ref 0.0–0.5)
Eosinophils Relative: 4 %
Immature Granulocytes: 0 %
Lymphocytes Relative: 15 %
Lymphs Abs: 0.8 10*3/uL (ref 0.7–4.0)
Monocytes Absolute: 0.5 10*3/uL (ref 0.1–1.0)
Monocytes Relative: 10 %
Neutro Abs: 3.9 10*3/uL (ref 1.7–7.7)
Neutrophils Relative %: 70 %

## 2019-12-15 LAB — MAGNESIUM: Magnesium: 2 mg/dL (ref 1.7–2.4)

## 2019-12-15 MED ORDER — TAMSULOSIN HCL 0.4 MG PO CAPS
0.4000 mg | ORAL_CAPSULE | Freq: Once | ORAL | Status: AC
  Administered 2019-12-15: 0.4 mg via ORAL
  Filled 2019-12-15: qty 1

## 2019-12-15 NOTE — Progress Notes (Signed)
Patient has returned from HD VSS.

## 2019-12-15 NOTE — Progress Notes (Addendum)
Patient off unit for HD treatment. Also patient only took BP med was only medication he was willing to take.

## 2019-12-15 NOTE — Progress Notes (Signed)
[facility]  ROUNDING NOTE   Subjective:     Seen and examined on hemodialysis treatment. 1K bath for the first hour then transition to 2K bath. Tolerating treatment well.     HEMODIALYSIS FLOWSHEET:  Blood Flow Rate (mL/min): 400 mL/min Arterial Pressure (mmHg): -140 mmHg Venous Pressure (mmHg): 150 mmHg Transmembrane Pressure (mmHg): 70 mmHg Ultrafiltration Rate (mL/min): 750 mL/min Dialysate Flow Rate (mL/min): 600 ml/min Conductivity: Machine : 13.9 Conductivity: Machine : 13.9 Dialysis Fluid Bolus: Normal Saline Bolus Amount (mL): 300 mL Dialysate Change: 2K       Objective:  Vital signs in last 24 hours:  Temp:  [97.7 F (36.5 C)-98.1 F (36.7 C)] 97.7 F (36.5 C) (11/25 8101) Pulse Rate:  [61-69] 63 (11/25 1130) Resp:  [16-20] 19 (11/25 1130) BP: (104-139)/(50-68) 116/56 (11/25 1130) SpO2:  [100 %] 100 % (11/25 1130)  Weight change:  Filed Weights   12/05/19 1757  Weight: 95.3 kg    Intake/Output: I/O last 3 completed shifts: In: 720 [P.O.:720] Out: -    Intake/Output this shift:  No intake/output data recorded.  Physical Exam: General:  laying in bed  Head:  Oral mucous membranes moist  Eyes:  Sclerae and conjunctivae clear  Lungs:   l respirations even, unlabored, lungs clear  Heart:  S1-S2, no rubs or gallops  Abdomen:  Soft, non tender,non distended  Extremities:  Lower extremity edema 1+  Neurologic:  Awake, alert, oriented  Skin: Rt leg with dressing clean,dry and intact  Access: Left AVG +bruit,+thrill    Basic Metabolic Panel: Recent Labs  Lab 12/10/19 1035 12/13/19 1000 12/15/19 1000  NA 132* 136 138  K 5.9* 6.3* 5.6*  CL 94* 96* 96*  CO2 27 29 29   GLUCOSE 89 105* 108*  BUN 46* 53* 43*  CREATININE 5.72* 6.54* 5.67*  CALCIUM 8.7* 8.9 9.3  MG  --   --  2.0  PHOS 6.0* 6.0* 4.9*    Liver Function Tests: Recent Labs  Lab 12/13/19 1000 12/15/19 1000  ALBUMIN 2.8* 2.9*   No results for input(s): LIPASE,  AMYLASE in the last 168 hours. No results for input(s): AMMONIA in the last 168 hours.  CBC: Recent Labs  Lab 12/13/19 1000 12/15/19 1000  WBC 6.5 5.5  NEUTROABS  --  3.9  HGB 7.2* 7.5*  HCT 23.0* 24.3*  MCV 95.4 96.0  PLT 117* 130*    Cardiac Enzymes: No results for input(s): CKTOTAL, CKMB, CKMBINDEX, TROPONINI in the last 168 hours.  BNP: Invalid input(s): POCBNP  CBG: No results for input(s): GLUCAP in the last 168 hours.  Microbiology: Results for orders placed or performed during the hospital encounter of 12/05/19  Respiratory Panel by RT PCR (Flu A&B, Covid) - Nasopharyngeal Swab     Status: None   Collection Time: 12/05/19 10:04 PM   Specimen: Nasopharyngeal Swab  Result Value Ref Range Status   SARS Coronavirus 2 by RT PCR NEGATIVE NEGATIVE Final    Comment: (NOTE) SARS-CoV-2 target nucleic acids are NOT DETECTED.  The SARS-CoV-2 RNA is generally detectable in upper respiratoy specimens during the acute phase of infection. The lowest concentration of SARS-CoV-2 viral copies this assay can detect is 131 copies/mL. A negative result does not preclude SARS-Cov-2 infection and should not be used as the sole basis for treatment or other patient management decisions. A negative result may occur with  improper specimen collection/handling, submission of specimen other than nasopharyngeal swab, presence of viral mutation(s) within the areas targeted by this  assay, and inadequate number of viral copies (<131 copies/mL). A negative result must be combined with clinical observations, patient history, and epidemiological information. The expected result is Negative.  Fact Sheet for Patients:  PinkCheek.be  Fact Sheet for Healthcare Providers:  GravelBags.it  This test is no t yet approved or cleared by the [place] FDA and  has been authorized for detection and/or diagnosis of SARS-CoV-2 by FDA under an  Emergency Use Authorization (EUA). This EUA will remain  in effect (meaning this test can be used) for the duration of the COVID-19 declaration under Section 564(b)(1) of the Act, 21 U.S.C. section 360bbb-3(b)(1), unless the authorization is terminated or revoked sooner.     Influenza A by PCR NEGATIVE NEGATIVE Final   Influenza B by PCR NEGATIVE NEGATIVE Final    Comment: (NOTE) The Xpert Xpress SARS-CoV-2/FLU/RSV assay is intended as an aid in  the diagnosis of influenza from Nasopharyngeal swab specimens and  should not be used as a sole basis for treatment. Nasal washings and  aspirates are unacceptable for Xpert Xpress SARS-CoV-2/FLU/RSV  testing.  Fact Sheet for Patients: PinkCheek.be  Fact Sheet for Healthcare Providers: GravelBags.it  This test is not yet approved or cleared by the [place] FDA and  has been authorized for detection and/or diagnosis of SARS-CoV-2 by  FDA under an Emergency Use Authorization (EUA). This EUA will remain  in effect (meaning this test can be used) for the duration of the  Covid-19 declaration under Section 564(b)(1) of the Act, 21  U.S.C. section 360bbb-3(b)(1), unless the authorization is  terminated or revoked. Performed at [facility], [address] [place], [place] [address]     Coagulation Studies: No results for input(s): LABPROT, INR in the last 72 hours.  Urinalysis: No results for input(s): COLORURINE, LABSPEC, PHURINE, GLUCOSEU, HGBUR, BILIRUBINUR, KETONESUR, PROTEINUR, UROBILINOGEN, NITRITE, LEUKOCYTESUR in the last 72 hours.  Invalid input(s): APPERANCEUR    Imaging: No results found.   Medications:   . albumin human 25 g (12/13/19 1021)   . aspirin EC  81 mg Oral Daily  . collagenase   Topical Daily  . epoetin (EPOGEN/PROCRIT) injection  10,000 Units Intravenous Q T,Th,Sa-HD  . feeding supplement (NEPRO CARB STEADY)  237 mL Oral BID BM  .  ferrous sulfate  325 mg Oral BID  . heparin  5,000 Units Subcutaneous Q8H  . midodrine  10 mg Oral TID WC  . multivitamin  1 tablet Oral QHS  . pantoprazole  40 mg Oral BID  . patiromer  25.2 g Oral Daily  . predniSONE  10 mg Oral Daily  . sevelamer carbonate  800 mg Oral BID WC  . sodium bicarbonate  1,300 mg Oral BID  . traZODone  50 mg Oral QHS   acetaminophen **OR** acetaminophen, calcium carbonate, loperamide, ondansetron **OR** ondansetron (ZOFRAN) IV, oxyCODONE, promethazine  Assessment/ Plan:  [name] is a 50 y.o. black male with end stage renal disease on hemodialysis, history of kidney transplant, lupus nephritis, and hypotension who was admitted to [facility] on 12/05/2019 for Hyperkalemia [E87.5] Cellulitis of right lower extremity [W29.562]  [facility] [place]/ Left AVG/TTS  # End stage renal disease.  Seen and examined on hemodialysis treatment Continue TTS schedule   #Anemia with chronic kidney disease.   Lab Results  Component Value Date   HGB 7.5 (L) 12/15/2019  Epogen with dialysis treatments  #Secondary Hyperparathyroidism Lab Results  Component Value Date   CALCIUM 9.3 12/15/2019   PHOS  4.9 (H) 12/15/2019  Continue Sevelamer  #Hyperkalemia Potassium 5.6 - low K bath with HD treatments.  Patient continues to refuse Veltassa,despite counseling multiple times  We will continue monitoring electrolyte levels   LOS: 9 Ascencion Coye 11/25/202111:49 AM

## 2019-12-15 NOTE — Progress Notes (Signed)
PROGRESS NOTE   [name]  QIW:[id number]    DOB: [birth year]    DOA: 12/05/2019  PCP: [facility]   I have briefly reviewed patients previous medical records in [facility]  Chief Complaint  Patient presents with  . Wound Infection  . 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  His case was communicated with ID and completed antibiotics on 12/09/2019.  Patient appears motivated to go to inpatient rehab as per discussion with [facility] team, the earliest he can go to rehab will be after dialysis on Saturday 11/27 because he cannot start outpatient HD until 11/30.  Covid test to be ordered for early morning on 11/27.   Assessment & Plan:  Principal Problem:   Hyperkalemia Active Problems:   Cellulitis of right lower extremity   ESRD on hemodialysis (HCC)   Systemic lupus erythematosus, unspecified ([place])   History of Renal transplant failure   Pseudomonas aeruginosa wound infection   Hyperkalemia ESRD on hemodialysis (HCC)with history of failed renal transplant -Patient received calcium gluconate and Veltassa in the emergency room -Nephrology following and patient undergoing regular HD. -Current plans are for HD on 11/25 and then on 11/27 before discharging to rehab and to start outpatient HD on 11/30. -Patient has been consistently refusing to take his Veltassa despite explaining to him in detail the risks of hyperkalemia including life-threatening cardiac arrhythmias and  death. -I discussed with nephrology team who indicated that he likely has persistently high potassiums and no need to repeat BMP this morning. -Potassium down from 6.3-5.6, improved.  Underwent HD 11/25.  Pseudomonas aeruginosa wound infection Cellulitis of right lower extremity Wound right lower leg Prior to [facility] MD communicated with infectious disease regarding antibiotic recommendations.  Patient completed his last dose of meropenem on 12/09/2019.  He has no further indication for systemic antibiotic therapy.  Continue wound care per WOCN recommendations.  Systemic lupus erythematosus, unspecified ([place]) Continue prednisone per home dose  Body mass index is 26.96 kg/m.   Anemia with chronic kidney disease Hemoglobin stable in the low 7 g range.  Periodically follow CBC across HD and transfuse for hemoglobin 7 g or below.  Thrombocytopenia: Appears chronic and intermittent.  Platelet count gradually improving, up to 130  Secondary hyperparathyroidism Management per nephrology.  Continue sevelamer.   DVT prophylaxis: heparin injection 5,000 Units Start: 12/05/19 2245     Code Status: Full Code Family Communication: None at bedside. Disposition:  Status is: Inpatient  Remains inpatient appropriate because:Inpatient level of care appropriate due to severity of illness   Dispo: The patient is from: Home              Anticipated d/c is to: SNF              Anticipated d/c date is: Likely 11/27              Patient currently is not medically stable to d/c.        Consultants:   Nephrology  Procedures:   HD  Antimicrobials:    Anti-infectives (From admission, onward)   Start     Dose/Rate Route Frequency Ordered Stop   12/07/19 0000  meropenem (MERREM) 500 mg in sodium chloride 0.9 % 100 mL IVPB        500 mg 200 mL/hr over 30 Minutes Intravenous Every 24 hours 12/06/19 1035 12/09/19 0020   12/05/19 2330  meropenem (MERREM) 500 mg in sodium chloride 0.9 % 100 mL  IVPB  Status:  Discontinued        500 mg 200 mL/hr over 30 Minutes Intravenous Every 24 hours 12/05/19 2315 12/06/19 1035   12/05/19 2315  meropenem (MERREM) 500 mg in sodium chloride 0.9 % 100 mL IVPB  Status:  Discontinued        500 mg 200 mL/hr over 30 Minutes Intravenous Every 12 hours 12/05/19 2312 12/05/19 2315   12/05/19 2215  meropenem (MERREM) 500 mg in sodium chloride 0.9 % 100 mL IVPB  Status:  Discontinued        500 mg 200 mL/hr over 30 Minutes Intravenous  Once 12/05/19 2202 12/05/19 2203        Subjective:  Patient seen this morning just before going for HD.  Denied complaints.  Not on room air prior to admission.  No dyspnea reported.  Advised him that we would try to wean him off oxygen if possible.  Patient requesting a dose of Flomax.  Objective:   Vitals:   12/15/19 1215 12/15/19 1230 12/15/19 1245 12/15/19 1409  BP: (!) 117/55 (!) 112/58 110/60 91/60  Pulse: 61 (!) 57 (!) 58 66  Resp: 19 19 19 18   Temp:    97.7 F (36.5 C)  TempSrc:    Oral  SpO2: 100% 100% 100% 97%  Weight:      Height:        General exam: Young male, moderately built and nourished lying comfortably propped up in bed without distress. Respiratory system: Clear to auscultation.  No increased work of breathing Cardiovascular system: S1 & S2 heard, RRR. No JVD, murmurs, rubs, gallops or clicks. No pedal edema. Gastrointestinal system: Abdomen is nondistended, soft and nontender. No organomegaly or masses felt. Normal bowel sounds heard. Central nervous system: Alert and oriented. No focal neurological deficits. Extremities: Symmetric 5 x 5 power.  Dressing over right anterior mid shin clean and dry.  Left upper arm AV fistula with good thrill. Skin: No rashes, lesions or ulcers Psychiatry: Judgement and insight appear normal. Mood & affect appropriate.     Data Reviewed:   I have personally reviewed following labs and imaging studies   CBC: Recent Labs  Lab 12/13/19 1000  12/15/19 1000  WBC 6.5 5.5  NEUTROABS  --  3.9  HGB 7.2* 7.5*  HCT 23.0* 24.3*  MCV 95.4 96.0  PLT 117* 130*    Basic Metabolic Panel: Recent Labs  Lab 12/10/19 1035 12/13/19 1000 12/15/19 1000  NA 132* 136 138  K 5.9* 6.3* 5.6*  CL 94* 96* 96*  CO2 27 29 29   GLUCOSE 89 105* 108*  BUN 46* 53* 43*  CREATININE 5.72* 6.54* 5.67*  CALCIUM 8.7* 8.9 9.3  MG  --   --  2.0  PHOS 6.0* 6.0* 4.9*    Liver Function Tests: Recent Labs  Lab 12/13/19 1000 12/15/19 1000  ALBUMIN 2.8* 2.9*    CBG: No results for input(s): GLUCAP in the last 168 hours.  Microbiology Studies:   Recent Results (from the past 240 hour(s))  Respiratory Panel by  RT PCR (Flu A&B, Covid) - Nasopharyngeal Swab     Status: None   Collection Time: 12/05/19 10:04 PM   Specimen: Nasopharyngeal Swab  Result Value Ref Range Status   SARS Coronavirus 2 by RT PCR NEGATIVE NEGATIVE Final    Comment: (NOTE) SARS-CoV-2 target nucleic acids are NOT DETECTED.  The SARS-CoV-2 RNA is generally detectable in upper respiratoy specimens during the acute phase of infection. The lowest concentration of SARS-CoV-2 viral copies this assay can detect is 131 copies/mL. A negative result does not preclude SARS-Cov-2 infection and should not be used as the sole basis for treatment or other patient management decisions. A negative result may occur with  improper specimen collection/handling, submission of specimen other than nasopharyngeal swab, presence of viral mutation(s) within the areas targeted by this assay, and inadequate number of viral copies (<131 copies/mL). A negative result must be combined with clinical observations, patient history, and epidemiological information. The expected result is Negative.  Fact Sheet for Patients:  PinkCheek.be  Fact Sheet for Healthcare Providers:  GravelBags.it  This test is no t yet approved or cleared by the [place] FDA and  has been authorized for detection and/or diagnosis of SARS-CoV-2 by FDA under an Emergency Use Authorization (EUA). This EUA will remain  in effect (meaning this test can be used) for the duration of the COVID-19 declaration under Section 564(b)(1) of the Act, 21 U.S.C. section 360bbb-3(b)(1), unless the authorization is terminated or revoked sooner.     Influenza A by PCR NEGATIVE NEGATIVE Final   Influenza B by PCR NEGATIVE NEGATIVE Final    Comment: (NOTE) The Xpert Xpress SARS-CoV-2/FLU/RSV assay is intended as an aid in  the diagnosis of influenza from Nasopharyngeal swab specimens and  should not be used as a sole basis for treatment. Nasal washings and  aspirates are unacceptable for Xpert Xpress SARS-CoV-2/FLU/RSV  testing.  Fact Sheet for Patients: PinkCheek.be  Fact Sheet for Healthcare Providers: GravelBags.it  This test is not yet approved or cleared by the [place] FDA and  has been authorized for detection and/or diagnosis of SARS-CoV-2 by  FDA under an Emergency Use Authorization (EUA). This EUA will remain  in effect (meaning this test can be used) for the duration of the  Covid-19 declaration under Section 564(b)(1) of the Act, 21  U.S.C. section 360bbb-3(b)(1), unless the authorization is  terminated or revoked. Performed at [facility], [address] [place],  [address]      Radiology Studies:  No results found.   Scheduled Meds:   . aspirin EC  81 mg Oral Daily  . collagenase   Topical Daily  . epoetin (EPOGEN/PROCRIT) injection  10,000 Units Intravenous Q T,Th,Sa-HD  . feeding supplement (NEPRO CARB STEADY)  237 mL Oral BID BM  . ferrous sulfate  325 mg Oral BID  . heparin  5,000 Units Subcutaneous Q8H  . midodrine  10 mg Oral TID WC  . multivitamin  1 tablet Oral QHS  . pantoprazole  40 mg Oral BID  . patiromer  25.2 g Oral Daily  . predniSONE   10 mg Oral Daily  . sevelamer carbonate  800 mg Oral BID WC  . sodium bicarbonate  1,300 mg Oral BID  . traZODone  50 mg Oral QHS    Continuous Infusions:   . albumin human 25 g (12/13/19 1021)     LOS: 9 days     Vernell Leep, MD, [place], [facility]. [facility]    To  contact the attending provider between 7A-7P or the covering provider during after hours 7P-7A, please log into the web site [email or web address] and access using universal [place] password for that web site. If you do not have the password, please call the hospital operator.  12/15/2019, 3:03 PM

## 2019-12-16 LAB — RESP PANEL BY RT-PCR (FLU A&B, COVID) ARPGX2
Influenza A by PCR: NEGATIVE
Influenza B by PCR: NEGATIVE
SARS Coronavirus 2 by RT PCR: NEGATIVE

## 2019-12-16 MED ORDER — TAMSULOSIN HCL 0.4 MG PO CAPS
0.4000 mg | ORAL_CAPSULE | ORAL | Status: DC
  Administered 2019-12-16 – 2019-12-23 (×5): 0.4 mg via ORAL
  Filled 2019-12-16 (×6): qty 1

## 2019-12-16 NOTE — Progress Notes (Signed)
OT Cancellation Note  Patient Details Name: [name] MRN: [id number] DOB: Mar 05, 1970   Cancelled Treatment:    Reason Eval/Treat Not Completed: Medical issues which prohibited therapy. Chart reviewed. Pt noted with continued critically elevated (5.6 on 11/25, no new labs today). K+ outside of appropriate range for OT services. Will continue to follow remotely and resume services once medically appropriate.  Jeni Salles, MPH, MS, OTR/L ascom [phone] 12/16/19, 3:38 PM

## 2019-12-16 NOTE — Progress Notes (Signed)
PROGRESS NOTE   [name]  YQI:[id number]    DOB: 11/14/1970    DOA: 12/05/2019  PCP: [place]   I have briefly reviewed patients previous medical records in [facility].  Chief Complaint  Patient presents with  . Wound Infection  . 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  His case was communicated with ID and completed antibiotics on 12/09/2019.  Patient appears motivated to go to inpatient rehab as per discussion with [facility] team, the earliest he can go to rehab will be after dialysis on Saturday 11/27 because he cannot start outpatient HD until 11/30.  Covid test to be ordered for early morning on 11/27.   Assessment & Plan:  Principal Problem:   Hyperkalemia Active Problems:   Cellulitis of right lower extremity   ESRD on hemodialysis (HCC)   Systemic lupus erythematosus, unspecified ([place])   History of Renal transplant failure   Pseudomonas aeruginosa wound infection   Hyperkalemia ESRD on hemodialysis (HCC)with history of failed renal transplant -Patient received calcium gluconate and Veltassa in the emergency room -Nephrology following and patient undergoing regular HD. -Current plans are for HD on 11/25 and then on 11/27 before discharging to rehab and to start outpatient HD on 11/30. -Patient has been consistently refusing to take his Veltassa despite explaining to him in detail the risks of hyperkalemia including life-threatening cardiac arrhythmias and  death. -I discussed with nephrology team who indicated that he likely has persistently high potassiums and no need to repeat BMP this morning. -Potassium down from 6.3-5.6, improved.  Underwent HD 11/25.  Next HD 11/27 prior to discharge and can check BMP again at that time. -Patient reported that he takes Flomax 0.4 mg daily on nondialysis days, prescribed to him from [place] for "prostate problems" which causes urinary urgency even though he does not make urine.  Discussed with nephrology and started same.  Pseudomonas aeruginosa wound infection Cellulitis of right lower extremity Wound right lower leg Prior to [facility] MD communicated with infectious disease regarding antibiotic recommendations.  Patient completed his last dose of meropenem on 12/09/2019.  He has no further indication for systemic antibiotic therapy.  Continue wound care per WOCN recommendations.  I examined wound on 11/26, appears clean, drying up and no acute findings.  Systemic lupus erythematosus, unspecified ([place]) Continue prednisone per home dose  Body mass index is 26.96 kg/m.   Anemia with chronic kidney disease Hemoglobin stable in the low 7 g range.  Periodically follow CBC across HD and transfuse for hemoglobin 7 g or below.  Thrombocytopenia: Appears chronic and intermittent.  Platelet count gradually improving, up to 130  Secondary hyperparathyroidism Management per nephrology.  Continue sevelamer.   DVT prophylaxis: heparin injection 5,000 Units Start: 12/05/19 2245     Code Status: Full Code Family Communication: None at bedside. Disposition:  Status is: Inpatient  Remains inpatient appropriate because:Inpatient level of care appropriate due to severity of illness   Dispo: The patient is from: Home  Anticipated d/c is to: SNF              Anticipated d/c date is: Likely 11/27              Patient currently is not medically stable to d/c.        Consultants:    Nephrology  Procedures:   HD  Antimicrobials:    Anti-infectives (From admission, onward)   Start     Dose/Rate Route Frequency Ordered Stop   12/07/19 0000  meropenem (MERREM) 500 mg in sodium chloride 0.9 % 100 mL IVPB        500 mg 200 mL/hr over 30 Minutes Intravenous Every 24 hours 12/06/19 1035 12/09/19 0020   12/05/19 2330  meropenem (MERREM) 500 mg in sodium chloride 0.9 % 100 mL IVPB  Status:  Discontinued        500 mg 200 mL/hr over 30 Minutes Intravenous Every 24 hours 12/05/19 2315 12/06/19 1035   12/05/19 2315  meropenem (MERREM) 500 mg in sodium chloride 0.9 % 100 mL IVPB  Status:  Discontinued        500 mg 200 mL/hr over 30 Minutes Intravenous Every 12 hours 12/05/19 2312 12/05/19 2315   12/05/19 2215  meropenem (MERREM) 500 mg in sodium chloride 0.9 % 100 mL IVPB  Status:  Discontinued        500 mg 200 mL/hr over 30 Minutes Intravenous  Once 12/05/19 2202 12/05/19 2203        Subjective:  Denies complaints.  Reports taking Flomax as noted above.  Objective:   Vitals:   12/15/19 1245 12/15/19 1409 12/16/19 0606 12/16/19 1214  BP: 110/60 91/60 101/61 106/62  Pulse: (!) 58 66 67 68  Resp: 19 18 16 17   Temp:  97.7 F (36.5 C) 98 F (36.7 C) 98.6 F (37 C)  TempSrc:  Oral Oral Oral  SpO2: 100% 97% 99% 100%  Weight:      Height:        General exam: Young male, moderately built and nourished lying comfortably propped up in bed without distress. Respiratory system: Clear to auscultation.  No increased work of breathing Cardiovascular system: S1 & S2 heard, RRR. No JVD, murmurs, rubs, gallops or clicks. No pedal edema. Gastrointestinal system: Abdomen is nondistended, soft and nontender. No organomegaly or masses felt. Normal bowel sounds heard. Central nervous system: Alert and oriented. No focal neurological deficits. Extremities: Symmetric 5 x 5 power.    I examined right leg/mid shin wound on 11/26, appears clean, drying up and no acute findings.  Left upper arm AV fistula with good thrill. Skin: No rashes, lesions or ulcers Psychiatry: Judgement and insight appear normal. Mood & affect appropriate.     Data Reviewed:   I have personally reviewed following labs and imaging studies   CBC: Recent Labs  Lab 12/13/19 1000 12/15/19 1000  WBC 6.5 5.5  NEUTROABS  --  3.9  HGB 7.2* 7.5*  HCT 23.0* 24.3*  MCV 95.4 96.0  PLT 117* 130*    Basic Metabolic Panel: Recent Labs  Lab 12/10/19 1035 12/13/19 1000 12/15/19 1000  NA 132* 136 138  K 5.9* 6.3* 5.6*  CL 94* 96* 96*  CO2 27 29 29   GLUCOSE 89 105* 108*  BUN 46* 53* 43*  CREATININE 5.72* 6.54* 5.67*  CALCIUM 8.7* 8.9 9.3  MG  --   --  2.0  PHOS 6.0* 6.0* 4.9*    Liver Function Tests: Recent Labs  Lab 12/13/19  1000 12/15/19 1000  ALBUMIN 2.8* 2.9*    CBG: No results for input(s): GLUCAP in the last 168 hours.  Microbiology Studies:   Recent Results (from the past 240 hour(s))  Resp Panel by RT-PCR (Flu A&B, Covid) Nasopharyngeal Swab     Status: None   Collection Time: 12/16/19 12:03 PM   Specimen: Nasopharyngeal Swab; Nasopharyngeal(NP) swabs in vial transport medium  Result Value Ref Range Status   SARS Coronavirus 2 by RT PCR NEGATIVE NEGATIVE Final    Comment: (NOTE) SARS-CoV-2 target nucleic acids are NOT DETECTED.  The SARS-CoV-2 RNA is generally detectable in upper respiratory specimens during the acute phase of infection. The lowest concentration of SARS-CoV-2 viral copies this assay can detect is 138 copies/mL. A negative result does not preclude SARS-Cov-2 infection and should not be used as the sole basis for treatment or other patient management decisions. A negative result may occur with  improper specimen collection/handling, submission of specimen other than nasopharyngeal swab, presence of viral mutation(s) within the areas targeted by this assay, and inadequate number of viral copies(<138 copies/mL). A negative result must be  combined with clinical observations, patient history, and epidemiological information. The expected result is Negative.  Fact Sheet for Patients:  EntrepreneurPulse.com.au  Fact Sheet for Healthcare Providers:  IncredibleEmployment.be  This test is no t yet approved or cleared by the [place] FDA and  has been authorized for detection and/or diagnosis of SARS-CoV-2 by FDA under an Emergency Use Authorization (EUA). This EUA will remain  in effect (meaning this test can be used) for the duration of the COVID-19 declaration under Section 564(b)(1) of the Act, 21 U.S.C.section 360bbb-3(b)(1), unless the authorization is terminated  or revoked sooner.       Influenza A by PCR NEGATIVE NEGATIVE Final   Influenza B by PCR NEGATIVE NEGATIVE Final    Comment: (NOTE) The Xpert Xpress SARS-CoV-2/FLU/RSV plus assay is intended as an aid in the diagnosis of influenza from Nasopharyngeal swab specimens and should not be used as a sole basis for treatment. Nasal washings and aspirates are unacceptable for Xpert Xpress SARS-CoV-2/FLU/RSV testing.  Fact Sheet for Patients: EntrepreneurPulse.com.au  Fact Sheet for Healthcare Providers: IncredibleEmployment.be  This test is not yet approved or cleared by the [place] FDA and has been authorized for detection and/or diagnosis of SARS-CoV-2 by FDA under an Emergency Use Authorization (EUA). This EUA will remain in effect (meaning this test can be used) for the duration of the COVID-19 declaration under Section 564(b)(1) of the Act, 21 U.S.C. section 360bbb-3(b)(1), unless the authorization is terminated or revoked.  Performed at [facility], [address] [place], [place] [address]      Radiology Studies:  No results found.   Scheduled Meds:   . aspirin EC  81 mg Oral Daily  . collagenase   Topical Daily  . epoetin (EPOGEN/PROCRIT)  injection  10,000 Units Intravenous Q T,Th,Sa-HD  . feeding supplement (NEPRO CARB STEADY)  237 mL Oral BID BM  . ferrous sulfate  325 mg Oral BID  . heparin  5,000 Units Subcutaneous Q8H  . midodrine  10 mg Oral TID WC  . multivitamin  1 tablet Oral QHS  . pantoprazole  40 mg Oral BID  . patiromer  25.2 g Oral Daily  . predniSONE  10 mg Oral Daily  . sevelamer carbonate  800 mg Oral BID WC  . sodium bicarbonate  1,300 mg Oral BID  . traZODone  50 mg Oral QHS  Continuous Infusions:   . albumin human 25 g (12/13/19 1021)     LOS: 10 days     Vernell Leep, MD, [place], [facility]. [facility]    To contact the attending provider between 7A-7P or the covering provider during after hours 7P-7A, please log into the web site [email or web address] and access using universal [place] password for that web site. If you do not have the password, please call the hospital operator.  12/16/2019, 4:07 PM

## 2019-12-16 NOTE — Progress Notes (Signed)
[facility]  ROUNDING NOTE   Subjective:    Patient is resting in bed, in no acute distress. He will get his next dialysis treatment tomorrow, first shift and primary team planning to discharge patient later in the afternoon tomorrow.       Objective:  Vital signs in last 24 hours:  Temp:  [97.7 F (36.5 C)-98 F (36.7 C)] 98 F (36.7 C) (11/26 0606) Pulse Rate:  [57-67] 67 (11/26 0606) Resp:  [16-19] 16 (11/26 0606) BP: (91-117)/(54-61) 101/61 (11/26 0606) SpO2:  [97 %-100 %] 99 % (11/26 0606)  Weight change:  Filed Weights   12/05/19 1757  Weight: 95.3 kg    Intake/Output: I/O last 3 completed shifts: In: -  Out: 1100 [Other:1100]   Intake/Output this shift:  Total I/O In: 120 [P.O.:120] Out: -   Physical Exam: General:  In no acute distress  Head:  Normocephalic,atraumatic  Eyes:  Anicteric  Lungs:   Lungs clear, Normal respiratory effort  Heart: Irregular  Abdomen:  Soft, non tender,non distended  Extremities:  Lower extremity edema 1+  Neurologic:  oriented x 3  Skin: Rt leg with dressing clean,dry and intact  Access: Left AVG +bruit,+thrill    Basic Metabolic Panel: Recent Labs  Lab 12/10/19 1035 12/13/19 1000 12/15/19 1000  NA 132* 136 138  K 5.9* 6.3* 5.6*  CL 94* 96* 96*  CO2 27 29 29   GLUCOSE 89 105* 108*  BUN 46* 53* 43*  CREATININE 5.72* 6.54* 5.67*  CALCIUM 8.7* 8.9 9.3  MG  --   --  2.0  PHOS 6.0* 6.0* 4.9*    Liver Function Tests: Recent Labs  Lab 12/13/19 1000 12/15/19 1000  ALBUMIN 2.8* 2.9*   No results for input(s): LIPASE, AMYLASE in the last 168 hours. No results for input(s): AMMONIA in the last 168 hours.  CBC: Recent Labs  Lab 12/13/19 1000 12/15/19 1000  WBC 6.5 5.5  NEUTROABS  --  3.9  HGB 7.2* 7.5*  HCT 23.0* 24.3*  MCV 95.4 96.0  PLT 117* 130*    Cardiac Enzymes: No results for input(s): CKTOTAL, CKMB, CKMBINDEX, TROPONINI in the last 168 hours.  BNP: Invalid input(s):  POCBNP  CBG: No results for input(s): GLUCAP in the last 168 hours.  Microbiology: Results for orders placed or performed during the hospital encounter of 12/05/19  Respiratory Panel by RT PCR (Flu A&B, Covid) - Nasopharyngeal Swab     Status: None   Collection Time: 12/05/19 10:04 PM   Specimen: Nasopharyngeal Swab  Result Value Ref Range Status   SARS Coronavirus 2 by RT PCR NEGATIVE NEGATIVE Final    Comment: (NOTE) SARS-CoV-2 target nucleic acids are NOT DETECTED.  The SARS-CoV-2 RNA is generally detectable in upper respiratoy specimens during the acute phase of infection. The lowest concentration of SARS-CoV-2 viral copies this assay can detect is 131 copies/mL. A negative result does not preclude SARS-Cov-2 infection and should not be used as the sole basis for treatment or other patient management decisions. A negative result may occur with  improper specimen collection/handling, submission of specimen other than nasopharyngeal swab, presence of viral mutation(s) within the areas targeted by this assay, and inadequate number of viral copies (<131 copies/mL). A negative result must be combined with clinical observations, patient history, and epidemiological information. The expected result is Negative.  Fact Sheet for Patients:  PinkCheek.be  Fact Sheet for Healthcare Providers:  GravelBags.it  This test is no t yet approved or cleared by the [place]  States FDA and  has been authorized for detection and/or diagnosis of SARS-CoV-2 by FDA under an Emergency Use Authorization (EUA). This EUA will remain  in effect (meaning this test can be used) for the duration of the COVID-19 declaration under Section 564(b)(1) of the Act, 21 U.S.C. section 360bbb-3(b)(1), unless the authorization is terminated or revoked sooner.     Influenza A by PCR NEGATIVE NEGATIVE Final   Influenza B by PCR NEGATIVE NEGATIVE Final     Comment: (NOTE) The Xpert Xpress SARS-CoV-2/FLU/RSV assay is intended as an aid in  the diagnosis of influenza from Nasopharyngeal swab specimens and  should not be used as a sole basis for treatment. Nasal washings and  aspirates are unacceptable for Xpert Xpress SARS-CoV-2/FLU/RSV  testing.  Fact Sheet for Patients: PinkCheek.be  Fact Sheet for Healthcare Providers: GravelBags.it  This test is not yet approved or cleared by the [place] FDA and  has been authorized for detection and/or diagnosis of SARS-CoV-2 by  FDA under an Emergency Use Authorization (EUA). This EUA will remain  in effect (meaning this test can be used) for the duration of the  Covid-19 declaration under Section 564(b)(1) of the Act, 21  U.S.C. section 360bbb-3(b)(1), unless the authorization is  terminated or revoked. Performed at [facility], [address] [place], [place] [address]     Coagulation Studies: No results for input(s): LABPROT, INR in the last 72 hours.  Urinalysis: No results for input(s): COLORURINE, LABSPEC, PHURINE, GLUCOSEU, HGBUR, BILIRUBINUR, KETONESUR, PROTEINUR, UROBILINOGEN, NITRITE, LEUKOCYTESUR in the last 72 hours.  Invalid input(s): APPERANCEUR    Imaging: No results found.   Medications:   . albumin human 25 g (12/13/19 1021)   . aspirin EC  81 mg Oral Daily  . collagenase   Topical Daily  . epoetin (EPOGEN/PROCRIT) injection  10,000 Units Intravenous Q T,Th,Sa-HD  . feeding supplement (NEPRO CARB STEADY)  237 mL Oral BID BM  . ferrous sulfate  325 mg Oral BID  . heparin  5,000 Units Subcutaneous Q8H  . midodrine  10 mg Oral TID WC  . multivitamin  1 tablet Oral QHS  . pantoprazole  40 mg Oral BID  . patiromer  25.2 g Oral Daily  . predniSONE  10 mg Oral Daily  . sevelamer carbonate  800 mg Oral BID WC  . sodium bicarbonate  1,300 mg Oral BID  . traZODone  50 mg Oral QHS    acetaminophen **OR** acetaminophen, calcium carbonate, loperamide, ondansetron **OR** ondansetron (ZOFRAN) IV, oxyCODONE, promethazine  Assessment/ Plan:  [name] is a 49 y.o. black male with end stage renal disease on hemodialysis, history of kidney transplant, lupus nephritis, and hypotension who was admitted to [facility] on 12/05/2019 for Hyperkalemia [E87.5] Cellulitis of right lower extremity [I43.329]  [facility] [place]/ Left AVG/TTS  # End stage renal disease.  Patient received his dialysis treatment yesterday Volume status acceptable No acute indication for additional dialysis  Will plan to dialyze him before discharge tomorrow Patient will start his outpatient dialysis on Tuesday 12/20/2019  #Anemia with chronic kidney disease.   Lab Results  Component Value Date   HGB 7.5 (L) 12/15/2019  Continue Epogen 10,000 units TTS  #Secondary Hyperparathyroidism Lab Results  Component Value Date   CALCIUM 9.3 12/15/2019   PHOS 4.9 (H) 12/15/2019  Will continue monitoring bone mineral metabolism parameters  #Hyperkalemia No labs today Dialyzed with low K+ bath yesterday Continues to refuse Patiromer   LOS: [place]  11/26/202111:31 AM

## 2019-12-16 NOTE — Progress Notes (Signed)
PT Cancellation Note  Patient Details Name: [name] MRN: [id number] DOB: 1970/05/22   Cancelled Treatment:    Reason Eval/Treat Not Completed: Medical issues which prohibited therapy. K+ at 5.6 this date, outside fo appropriate range for PT services. Will continue to follow remotely and resume services once medically appropriate.   Sircharles Holzheimer C 12/16/2019, 3:02 PM

## 2019-12-16 NOTE — Plan of Care (Signed)

## 2019-12-17 LAB — RENAL FUNCTION PANEL
Albumin: 2.9 g/dL — ABNORMAL LOW (ref 3.5–5.0)
Anion gap: 10 (ref 5–15)
BUN: 28 mg/dL — ABNORMAL HIGH (ref 6–20)
CO2: 31 mmol/L (ref 22–32)
Calcium: 9.1 mg/dL (ref 8.9–10.3)
Chloride: 96 mmol/L — ABNORMAL LOW (ref 98–111)
Creatinine, Ser: 4.21 mg/dL — ABNORMAL HIGH (ref 0.61–1.24)
GFR, Estimated: 16 mL/min — ABNORMAL LOW (ref >60)
Glucose, Bld: 112 mg/dL — ABNORMAL HIGH (ref 70–99)
Phosphorus: 3.4 mg/dL (ref 2.5–4.6)
Potassium: 4.2 mmol/L (ref 3.5–5.1)
Sodium: 137 mmol/L (ref 135–145)

## 2019-12-17 LAB — PREPARE RBC (CROSSMATCH)

## 2019-12-17 LAB — DIFFERENTIAL
Abs Immature Granulocytes: 0.04 10*3/uL (ref 0.00–0.07)
Basophils Absolute: 0 10*3/uL (ref 0.0–0.1)
Basophils Relative: 1 %
Eosinophils Absolute: 0.2 10*3/uL (ref 0.0–0.5)
Eosinophils Relative: 3 %
Immature Granulocytes: 1 %
Lymphocytes Relative: 14 %
Lymphs Abs: 0.7 10*3/uL (ref 0.7–4.0)
Monocytes Absolute: 0.5 10*3/uL (ref 0.1–1.0)
Monocytes Relative: 10 %
Neutro Abs: 3.9 10*3/uL (ref 1.7–7.7)
Neutrophils Relative %: 71 %

## 2019-12-17 LAB — CBC
HCT: 23.4 % — ABNORMAL LOW (ref 39.0–52.0)
Hemoglobin: 7.2 g/dL — ABNORMAL LOW (ref 13.0–17.0)
MCH: 29.5 pg (ref 26.0–34.0)
MCHC: 30.8 g/dL (ref 30.0–36.0)
MCV: 95.9 fL (ref 80.0–100.0)
Platelets: 149 10*3/uL — ABNORMAL LOW (ref 150–400)
RBC: 2.44 MIL/uL — ABNORMAL LOW (ref 4.22–5.81)
RDW: 17 % — ABNORMAL HIGH (ref 11.5–15.5)
WBC: 5.4 10*3/uL (ref 4.0–10.5)
nRBC: 0 % (ref 0.0–0.2)

## 2019-12-17 LAB — HEMOGLOBIN AND HEMATOCRIT, BLOOD
HCT: 26.6 % — ABNORMAL LOW (ref 39.0–52.0)
Hemoglobin: 8.2 g/dL — ABNORMAL LOW (ref 13.0–17.0)

## 2019-12-17 MED ORDER — MIDODRINE HCL 10 MG PO TABS
10.0000 mg | ORAL_TABLET | Freq: Three times a day (TID) | ORAL | Status: DC
Start: 2019-12-17 — End: 2020-03-27

## 2019-12-17 MED ORDER — LOPERAMIDE HCL 2 MG PO CAPS: 2.0000 mg | ORAL_CAPSULE | Freq: Four times a day (QID) | ORAL | Status: AC | PRN

## 2019-12-17 MED ORDER — TAMSULOSIN HCL 0.4 MG PO CAPS: ORAL_CAPSULE | ORAL | Status: AC

## 2019-12-17 MED ORDER — HYDROCODONE-ACETAMINOPHEN 10-325 MG PO TABS: 1.0000 | ORAL_TABLET | Freq: Three times a day (TID) | ORAL | 0 refills | Status: DC | PRN

## 2019-12-17 MED ORDER — SEVELAMER CARBONATE 800 MG PO TABS
800.0000 mg | ORAL_TABLET | Freq: Two times a day (BID) | ORAL | Status: DC
Start: 2019-12-17 — End: 2021-07-26

## 2019-12-17 MED ORDER — PATIROMER SORBITEX CALCIUM 8.4 G PO PACK
25.2000 g | PACK | Freq: Every day | ORAL | Status: DC
Start: 2019-12-17 — End: 2019-12-20

## 2019-12-17 MED ORDER — COLLAGENASE 250 UNIT/GM EX OINT
TOPICAL_OINTMENT | Freq: Every day | CUTANEOUS | Status: DC
Start: 2019-12-17 — End: 2020-03-27

## 2019-12-17 MED ORDER — SODIUM CHLORIDE 0.9% IV SOLUTION: Freq: Once | INTRAVENOUS | Status: AC

## 2019-12-17 NOTE — Progress Notes (Signed)
Albumin 25% administered at this time.

## 2019-12-17 NOTE — Progress Notes (Signed)
OT Cancellation Note  Patient Details Name: [name] MRN: [id number] DOB: 01-Aug-1970   Cancelled Treatment:    Reason Eval/Treat Not Completed: Patient at procedure or test/ unavailable. Pt at dialysis. Also note possible blood transfusion. Will re-attempt OT tx at later date/time as appropriate.   Jeni Salles, MPH, MS, OTR/L ascom [phone] 12/17/19, 12:37 PM

## 2019-12-17 NOTE — Progress Notes (Signed)
[facility]  ROUNDING NOTE   Subjective:    Seen and examined on hemodialysis treatment. Tolerating treatment well. UF goal of 2 liters.  Plan for discharge to SNF later today.  Labs are pending.     HEMODIALYSIS FLOWSHEET:  Blood Flow Rate (mL/min): 400 mL/min Arterial Pressure (mmHg): -140 mmHg Venous Pressure (mmHg): 150 mmHg Transmembrane Pressure (mmHg): 70 mmHg Ultrafiltration Rate (mL/min): 750 mL/min Dialysate Flow Rate (mL/min): 600 ml/min Conductivity: Machine : 13.9 Conductivity: Machine : 13.9 Dialysis Fluid Bolus: Normal Saline Bolus Amount (mL): 200 mL Dialysate Change: 2K     Objective:  Vital signs in last 24 hours:  Temp:  [97.5 F (36.4 C)-98.6 F (37 C)] 97.6 F (36.4 C) (11/27 0725) Pulse Rate:  [60-86] 86 (11/27 0725) Resp:  [17-18] 17 (11/27 0725) BP: (101-121)/(58-68) 101/59 (11/27 0725) SpO2:  [95 %-100 %] 100 % (11/27 0725)  Weight change:  Filed Weights   12/05/19 1757  Weight: 95.3 kg    Intake/Output: I/O last 3 completed shifts: In: 120 [P.O.:120] Out: -    Intake/Output this shift:  Total I/O In: 700 [P.O.:700] Out: -   Physical Exam: General:  In no acute distress, laying in bed  Head:  Normocephalic,atraumatic  Eyes:  Anicteric  Lungs:   Lungs clear   Heart: Irregular  Abdomen:  Soft, non tender,non distended  Extremities:  Lower extremity edema 1+  Neurologic:  oriented x 3  Skin: Rt leg with dressing clean,dry and intact  Access: Left AVG +bruit,+thrill    Basic Metabolic Panel: Recent Labs  Lab 12/13/19 1000 12/15/19 1000  NA 136 138  K 6.3* 5.6*  CL 96* 96*  CO2 29 29  GLUCOSE 105* 108*  BUN 53* 43*  CREATININE 6.54* 5.67*  CALCIUM 8.9 9.3  MG  --  2.0  PHOS 6.0* 4.9*    Liver Function Tests: Recent Labs  Lab 12/13/19 1000 12/15/19 1000  ALBUMIN 2.8* 2.9*   No results for input(s): LIPASE, AMYLASE in the last 168 hours. No results for input(s): AMMONIA in the last 168  hours.  CBC: Recent Labs  Lab 12/13/19 1000 12/15/19 1000  WBC 6.5 5.5  NEUTROABS  --  3.9  HGB 7.2* 7.5*  HCT 23.0* 24.3*  MCV 95.4 96.0  PLT 117* 130*    Cardiac Enzymes: No results for input(s): CKTOTAL, CKMB, CKMBINDEX, TROPONINI in the last 168 hours.  BNP: Invalid input(s): POCBNP  CBG: No results for input(s): GLUCAP in the last 168 hours.  Microbiology: Results for orders placed or performed during the hospital encounter of 12/05/19  Respiratory Panel by RT PCR (Flu A&B, Covid) - Nasopharyngeal Swab     Status: None   Collection Time: 12/05/19 10:04 PM   Specimen: Nasopharyngeal Swab  Result Value Ref Range Status   SARS Coronavirus 2 by RT PCR NEGATIVE NEGATIVE Final    Comment: (NOTE) SARS-CoV-2 target nucleic acids are NOT DETECTED.  The SARS-CoV-2 RNA is generally detectable in upper respiratoy specimens during the acute phase of infection. The lowest concentration of SARS-CoV-2 viral copies this assay can detect is 131 copies/mL. A negative result does not preclude SARS-Cov-2 infection and should not be used as the sole basis for treatment or other patient management decisions. A negative result may occur with  improper specimen collection/handling, submission of specimen other than nasopharyngeal swab, presence of viral mutation(s) within the areas targeted by this assay, and inadequate number of viral copies (<131 copies/mL). A negative result must be combined with  clinical observations, patient history, and epidemiological information. The expected result is Negative.  Fact Sheet for Patients:  PinkCheek.be  Fact Sheet for Healthcare Providers:  GravelBags.it  This test is no t yet approved or cleared by the [place] FDA and  has been authorized for detection and/or diagnosis of SARS-CoV-2 by FDA under an Emergency Use Authorization (EUA). This EUA will remain  in effect (meaning this  test can be used) for the duration of the COVID-19 declaration under Section 564(b)(1) of the Act, 21 U.S.C. section 360bbb-3(b)(1), unless the authorization is terminated or revoked sooner.     Influenza A by PCR NEGATIVE NEGATIVE Final   Influenza B by PCR NEGATIVE NEGATIVE Final    Comment: (NOTE) The Xpert Xpress SARS-CoV-2/FLU/RSV assay is intended as an aid in  the diagnosis of influenza from Nasopharyngeal swab specimens and  should not be used as a sole basis for treatment. Nasal washings and  aspirates are unacceptable for Xpert Xpress SARS-CoV-2/FLU/RSV  testing.  Fact Sheet for Patients: PinkCheek.be  Fact Sheet for Healthcare Providers: GravelBags.it  This test is not yet approved or cleared by the [place] FDA and  has been authorized for detection and/or diagnosis of SARS-CoV-2 by  FDA under an Emergency Use Authorization (EUA). This EUA will remain  in effect (meaning this test can be used) for the duration of the  Covid-19 declaration under Section 564(b)(1) of the Act, 21  U.S.C. section 360bbb-3(b)(1), unless the authorization is  terminated or revoked. Performed at [facility], [address] [place], [place] [address]   Resp Panel by RT-PCR (Flu A&B, Covid) Nasopharyngeal Swab     Status: None   Collection Time: 12/16/19 12:03 PM   Specimen: Nasopharyngeal Swab; Nasopharyngeal(NP) swabs in vial transport medium  Result Value Ref Range Status   SARS Coronavirus 2 by RT PCR NEGATIVE NEGATIVE Final    Comment: (NOTE) SARS-CoV-2 target nucleic acids are NOT DETECTED.  The SARS-CoV-2 RNA is generally detectable in upper respiratory specimens during the acute phase of infection. The lowest concentration of SARS-CoV-2 viral copies this assay can detect is 138 copies/mL. A negative result does not preclude SARS-Cov-2 infection and should not be used as the sole basis for treatment  or other patient management decisions. A negative result may occur with  improper specimen collection/handling, submission of specimen other than nasopharyngeal swab, presence of viral mutation(s) within the areas targeted by this assay, and inadequate number of viral copies(<138 copies/mL). A negative result must be combined with clinical observations, patient history, and epidemiological information. The expected result is Negative.  Fact Sheet for Patients:  EntrepreneurPulse.com.au  Fact Sheet for Healthcare Providers:  IncredibleEmployment.be  This test is no t yet approved or cleared by the [place] FDA and  has been authorized for detection and/or diagnosis of SARS-CoV-2 by FDA under an Emergency Use Authorization (EUA). This EUA will remain  in effect (meaning this test can be used) for the duration of the COVID-19 declaration under Section 564(b)(1) of the Act, 21 U.S.C.section 360bbb-3(b)(1), unless the authorization is terminated  or revoked sooner.       Influenza A by PCR NEGATIVE NEGATIVE Final   Influenza B by PCR NEGATIVE NEGATIVE Final    Comment: (NOTE) The Xpert Xpress SARS-CoV-2/FLU/RSV plus assay is intended as an aid in the diagnosis of influenza from Nasopharyngeal swab specimens and should not be used as a sole basis for treatment. Nasal washings and aspirates are unacceptable for Xpert Xpress SARS-CoV-2/FLU/RSV testing.  Fact Sheet for Patients: EntrepreneurPulse.com.au  Fact Sheet for Healthcare Providers: IncredibleEmployment.be  This test is not yet approved or cleared by the [place] FDA and has been authorized for detection and/or diagnosis of SARS-CoV-2 by FDA under an Emergency Use Authorization (EUA). This EUA will remain in effect (meaning this test can be used) for the duration of the COVID-19 declaration under Section 564(b)(1) of the Act, 21 U.S.C. section  360bbb-3(b)(1), unless the authorization is terminated or revoked.  Performed at [facility], [address] [place], [place] [address]     Coagulation Studies: No results for input(s): LABPROT, INR in the last 72 hours.  Urinalysis: No results for input(s): COLORURINE, LABSPEC, PHURINE, GLUCOSEU, HGBUR, BILIRUBINUR, KETONESUR, PROTEINUR, UROBILINOGEN, NITRITE, LEUKOCYTESUR in the last 72 hours.  Invalid input(s): APPERANCEUR    Imaging: No results found.   Medications:   . albumin human 25 g (12/13/19 1021)   . aspirin EC  81 mg Oral Daily  . collagenase   Topical Daily  . epoetin (EPOGEN/PROCRIT) injection  10,000 Units Intravenous Q T,Th,Sa-HD  . feeding supplement (NEPRO CARB STEADY)  237 mL Oral BID BM  . ferrous sulfate  325 mg Oral BID  . heparin  5,000 Units Subcutaneous Q8H  . midodrine  10 mg Oral TID WC  . multivitamin  1 tablet Oral QHS  . pantoprazole  40 mg Oral BID  . patiromer  25.2 g Oral Daily  . predniSONE  10 mg Oral Daily  . sevelamer carbonate  800 mg Oral BID WC  . sodium bicarbonate  1,300 mg Oral BID  . tamsulosin  0.4 mg Oral Once per day on Sun Mon Wed Fri  . traZODone  50 mg Oral QHS   acetaminophen **OR** acetaminophen, calcium carbonate, loperamide, ondansetron **OR** ondansetron (ZOFRAN) IV, oxyCODONE, promethazine  Assessment/ Plan:  [name] is a 49 y.o. black male with end stage renal disease on hemodialysis, history of kidney transplant, lupus nephritis, and hypotension who was admitted to [facility] on 12/05/2019 for Hyperkalemia [E87.5] Cellulitis of right lower extremity [J44.920]  # End stage renal disease.  Seen and examined on hemodialysis treatment.  Patient will start his outpatient dialysis on Tuesday 12/20/2019  #Anemia with chronic kidney disease.   Lab Results  Component Value Date   HGB 7.5 (L) 12/15/2019  Continue Epogen 10,000 units TTS Low threshold for transfusion  #Secondary  Hyperparathyroidism Lab Results  Component Value Date   CALCIUM 9.3 12/15/2019   PHOS 4.9 (H) 12/15/2019  Will continue monitoring bone mineral metabolism parameters. Not on binders currently.   #Hyperkalemia 1K bath with dialysis treatment Continues to refuse Patiromer   LOS: 11 Fadel Clason 11/27/202111:02 AM

## 2019-12-17 NOTE — Progress Notes (Addendum)
Patient continues to refuse UF off at this time with noted decreased blood pressure. Patient remains without any c/o any when asked. Albumin administered as ordered and completed at this time.

## 2019-12-17 NOTE — Discharge Summary (Signed)
Physician Discharge Summary  [name] EYC:[id number] DOB: 11/09/1970  PCP: [facility]  Admitted from: Home Discharged to: SNF  Admit date: 12/05/2019 Discharge date: 12/17/2019  Recommendations for Outpatient Follow-up:    Follow-up Information    MD at SNF. Schedule an appointment as soon as possible for a visit.   Why: To be seen in 2 to 3 days with repeat labs (CBC & renal panel).  Recommend wound care consultation and follow-up of right leg chronic wound.       Hemodialysis center Follow up on 12/20/2019.   Why: Continue scheduled hemodialysis on Tuesdays, Thursdays and Saturdays.       [place] Schedule an appointment as soon as possible for a visit.   Specialty: General Practice Why: Upon discharge from SNF. Contact information: [place] [id number] [phone]                Home Health: None    Equipment/Devices: TBD at SNF    Discharge Condition: Improved and stable   Code Status: Full Code Diet recommendation:  Discharge Diet Orders (From admission, onward)    Start     Ordered   12/17/19 0000  Diet - low sodium heart healthy        12/17/19 1123           Discharge Diagnoses:  Principal Problem:   Hyperkalemia Active Problems:   Cellulitis of right lower extremity   ESRD on hemodialysis (HCC)   Systemic lupus erythematosus, unspecified ([place])   History of Renal transplant failure   Pseudomonas aeruginosa wound infection   Brief Summary: 49 year old male with past medical history significant for ESRD secondary to SLE, 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  His case was communicated with ID and he completed antibiotics on 12/09/2019.    Improved and stable.   Assessment & Plan:   Hyperkalemia ESRD on hemodialysis (HCC)with history of failed renal transplant -Patient received calcium gluconate and Veltassa in the emergency room -Nephrology following and patient undergoing regular HD. -Plans are for discharge to SNF today after HD.  Communicated with Nephrology, recommended discontinuing Epogen which can be redosed at outpatient HD center, stop bicarbonate and albumin which she was getting here.  Continue Veltassa, Renvela, midodrine and iron supplements. -Patient has been consistently refusing to take his Veltassa despite explaining to him in detail the risks of hyperkalemia including life-threatening cardiac arrhythmias and death. -I discussed with nephrology team who indicated that he likely has persistently high potassiums.  Continue to encourage consistent use of Veltassa.  Also was on a regular diet today, likely due to his preference but recommend renal/low-sodium heart healthy diet. -Potassium down from 6.3-5.6, improved.  Underwent HD 11/25.  Next HD 11/27 today, prior to DC. -Patient reported that he takes Flomax 0.4 mg daily on nondialysis days, prescribed to him from [place] for "prostate problems" which causes urinary urgency even though he does not make urine.  Discussed with nephrology and started same.  Pseudomonas aeruginosa wound infection Cellulitis of right lower extremity Wound right lower leg Prior to [facility] MD communicated  with infectious disease regarding antibiotic recommendations. Patient completed his last dose of meropenem on 12/09/2019. He has no further indication for systemic antibiotic therapy. Continue wound care per [facility] recommendations.  I examined wound on 11/26, appears clean, drying up and no acute findings.  Systemic lupus  erythematosus, unspecified ([place]) Continue prednisone per home dose  Body mass index is 26.96 kg/m.   Anemia with chronic kidney disease Hemoglobin stable in the low 7 g range.  Periodically follow CBC across HD and transfuse for hemoglobin 7 g or below.  As per [facility] team, SNF reportedly once hemoglobin to be 7.5 and above.  Requested nephrology to check CBC across HD and transfuse as needed.  Hemoglobin 7.2.  Thrombocytopenia: Appears chronic and intermittent.  Platelet count gradually improving, up to 149  Secondary hyperparathyroidism Management per nephrology.  Continue sevelamer.      Consultants:   Nephrology  Procedures:   HD    Discharge Instructions  Discharge Instructions    Ambulatory referral to Podiatry   Complete by: As directed    Call MD for:  difficulty breathing, headache or visual disturbances   Complete by: As directed    Call MD for:  extreme fatigue   Complete by: As directed    Call MD for:  persistant dizziness or light-headedness   Complete by: As directed    Call MD for:  persistant nausea and vomiting   Complete by: As directed    Call MD for:  redness, tenderness, or signs of infection (pain, swelling, redness, odor or green/yellow discharge around incision site)   Complete by: As directed    Call MD for:  severe uncontrolled pain   Complete by: As directed    Call MD for:  temperature >100.4   Complete by: As directed    Diet - low sodium heart healthy   Complete by: As directed    Discharge wound care:   Complete by: As directed    Apply Santyl to right leg wound Q day, then cover with moist gauze and foam dressing.  (Change foam dressing Q 3 days or PRN soiling.)   Increase activity slowly   Complete by: As directed        Medication List    STOP taking these medications   epoetin alfa 10000 UNIT/ML injection Commonly known as: EPOGEN     TAKE these medications   aspirin EC 81 MG tablet Take 81 mg by mouth daily.    collagenase ointment Commonly known as: SANTYL Apply topically daily. Apply Santyl to right leg wound Q day, then cover with moist gauze and foam dressing.  (Change foam dressing Q 3 days or PRN soiling.)   feeding supplement (NEPRO CARB STEADY) Liqd Take 237 mLs by mouth 2 (two) times daily between meals.   ferrous sulfate 324 (65 Fe) MG Tbec Take 1 tablet (325 mg total) by mouth 2 (two) times daily.   HYDROcodone-acetaminophen 10-325 MG tablet Commonly known as: NORCO Take 1 tablet by mouth every 8 (eight) hours as needed for moderate pain or severe pain. Do Not Fill Before 11/21/2019 What changed: reasons to take this   loperamide 2 MG capsule Commonly known as: IMODIUM Take 1 capsule (2 mg total) by mouth every 6 (six) hours as needed for diarrhea or loose stools. For 3 to 5 days, then stop   midodrine 10 MG tablet Commonly known as: PROAMATINE Take 1 tablet (10 mg total) by mouth 3 (three) times daily with meals. What changed:  medication strength  how much to take  when to take this   multivitamin Tabs tablet Take 1 tablet by mouth at bedtime.   pantoprazole 40 MG tablet Commonly known as: Protonix Take 1 tablet (40 mg total) by mouth 2 (two) times daily.   patiromer 8.4 g packet Commonly known as: VELTASSA Take 3 packets (25.2 g total) by mouth daily.   predniSONE 10 MG tablet Commonly known as: DELTASONE Take 10 mg by mouth daily.   promethazine 25 MG tablet Commonly known as: PHENERGAN Take 25 mg by mouth every 6 (six) hours as needed for nausea or vomiting.   sevelamer carbonate 800 MG tablet Commonly known as: RENVELA Take 1 tablet (800 mg total) by mouth 2 (two) times daily with a meal.   tamsulosin 0.4 MG Caps capsule Commonly known as: FLOMAX Once per day on Sun Mon Wed Fri (i.e. nonhemodialysis days). What changed:   how much to take  how to take this  when to take this  reasons to take this  additional instructions   traZODone 50  MG tablet Commonly known as: DESYREL Take 50 mg by mouth at bedtime.      Allergies  Allergen Reactions  . Ciprofloxacin Rash    skin on hands peeled off "in sheets."  . Sulfa Antibiotics Rash      Procedures/Studies:   Subjective: Patient seen this morning prior to HD.  Aware that he is supposed to go to SNF after HD.  Denies complaints.  Appreciative of care that he received in the hospital.  No pain reported.  No dyspnea.  Discharge Exam:  Vitals:   12/16/19 1214 12/16/19 1757 12/17/19 0000 12/17/19 0725  BP: 106/62 116/68 (!) 121/58 (!) 101/59  Pulse: 68 68 60 86  Resp: 17 17 18 17   Temp: 98.6 F (37 C) 97.7 F (36.5 C) (!) 97.5 F (36.4 C) 97.6 F (36.4 C)  TempSrc: Oral Oral Oral Oral  SpO2: 100% 95%  100%  Weight:      Height:        General exam: Young male, moderately built and nourished lying comfortably propped up in bed without distress. Respiratory system: Clear to auscultation.  No increased work of breathing Cardiovascular system: S1 & S2 heard, RRR. No JVD, murmurs, rubs, gallops or clicks. No pedal edema. Gastrointestinal system: Abdomen is nondistended, soft and nontender. No organomegaly or masses felt. Normal bowel sounds heard. Central nervous system: Alert and oriented. No focal neurological deficits. Extremities: Symmetric 5 x 5 power.    I examined right leg/mid shin wound on 11/26, appears clean, drying up and no acute findings. Left upper arm AV fistula with good thrill. Skin: No rashes, lesions or ulcers Psychiatry: Judgement and insight appear normal. Mood & affect appropriate.     The results of significant diagnostics from this hospitalization (including imaging, microbiology, ancillary and laboratory) are listed below for reference.     Microbiology: Recent Results (from the past 240 hour(s))  Resp Panel by RT-PCR (Flu A&B, Covid) Nasopharyngeal Swab     Status: None   Collection Time: 12/16/19 12:03 PM   Specimen: Nasopharyngeal  Swab; Nasopharyngeal(NP) swabs in vial transport medium  Result Value Ref Range Status   SARS Coronavirus 2 by RT PCR NEGATIVE NEGATIVE Final    Comment: (NOTE) SARS-CoV-2 target nucleic acids are NOT DETECTED.  The SARS-CoV-2 RNA is generally detectable in upper respiratory specimens during the acute phase of infection. The lowest concentration of SARS-CoV-2 viral copies this assay  can detect is 138 copies/mL. A negative result does not preclude SARS-Cov-2 infection and should not be used as the sole basis for treatment or other patient management decisions. A negative result may occur with  improper specimen collection/handling, submission of specimen other than nasopharyngeal swab, presence of viral mutation(s) within the areas targeted by this assay, and inadequate number of viral copies(<138 copies/mL). A negative result must be combined with clinical observations, patient history, and epidemiological information. The expected result is Negative.  Fact Sheet for Patients:  EntrepreneurPulse.com.au  Fact Sheet for Healthcare Providers:  IncredibleEmployment.be  This test is no t yet approved or cleared by the [place] FDA and  has been authorized for detection and/or diagnosis of SARS-CoV-2 by FDA under an Emergency Use Authorization (EUA). This EUA will remain  in effect (meaning this test can be used) for the duration of the COVID-19 declaration under Section 564(b)(1) of the Act, 21 U.S.C.section 360bbb-3(b)(1), unless the authorization is terminated  or revoked sooner.       Influenza A by PCR NEGATIVE NEGATIVE Final   Influenza B by PCR NEGATIVE NEGATIVE Final    Comment: (NOTE) The Xpert Xpress SARS-CoV-2/FLU/RSV plus assay is intended as an aid in the diagnosis of influenza from Nasopharyngeal swab specimens and should not be used as a sole basis for treatment. Nasal washings and aspirates are unacceptable for Xpert Xpress  SARS-CoV-2/FLU/RSV testing.  Fact Sheet for Patients: EntrepreneurPulse.com.au  Fact Sheet for Healthcare Providers: IncredibleEmployment.be  This test is not yet approved or cleared by the [place] FDA and has been authorized for detection and/or diagnosis of SARS-CoV-2 by FDA under an Emergency Use Authorization (EUA). This EUA will remain in effect (meaning this test can be used) for the duration of the COVID-19 declaration under Section 564(b)(1) of the Act, 21 U.S.C. section 360bbb-3(b)(1), unless the authorization is terminated or revoked.  Performed at [facility], [address] [place], [place] [address]      Labs: CBC: Recent Labs  Lab 12/13/19 1000 12/15/19 1000 12/17/19 1116  WBC 6.5 5.5 5.4  NEUTROABS  --  3.9 3.9  HGB 7.2* 7.5* 7.2*  HCT 23.0* 24.3* 23.4*  MCV 95.4 96.0 95.9  PLT 117* 130* 149*    Basic Metabolic Panel: Recent Labs  Lab 12/13/19 1000 12/15/19 1000  NA 136 138  K 6.3* 5.6*  CL 96* 96*  CO2 29 29  GLUCOSE 105* 108*  BUN 53* 43*  CREATININE 6.54* 5.67*  CALCIUM 8.9 9.3  MG  --  2.0  PHOS 6.0* 4.9*    Liver Function Tests: Recent Labs  Lab 12/13/19 1000 12/15/19 1000  ALBUMIN 2.8* 2.9*     Time coordinating discharge: 45 minutes  SIGNED:  Vernell Leep, MD, FACP, [facility]. [facility]  To contact the attending provider between 7A-7P or the covering provider during after hours 7P-7A, please log into the web site [email or web address] and access using universal [place] password for that web site. If you do not have the password, please call the hospital operator.

## 2019-12-17 NOTE — Plan of Care (Signed)
  Problem: Clinical Measurements: Goal: Will remain free from infection Outcome: Progressing Goal: Diagnostic test results will improve Outcome: Progressing Goal: Respiratory complications will improve Outcome: Progressing Goal: Cardiovascular complication will be avoided Outcome: Progressing   

## 2019-12-17 NOTE — TOC Progression Note (Addendum)
Transition of Care [facility]) - Progression Note    Patient Details  Name: [name] MRN: [id number] Date of Birth: 1970/07/14  Transition of Care [facility]) CM/SW [place], LCSW Phone Number: 12/17/2019, 2:19 PM  Clinical Narrative:    CSW was contacted by Lindwood Coke of [facility]. She notified CSW that they were expecting the pt today and wanted to know when he would be done with dialysis, Sharyn Lull also requestewd latest progress note for MD. Faxed progress note and sent secure chat to RN and was notified that pt was sent to dialysis at [facility] received another call from the [facility] and was notified that per the MD at [facility] the pt's hemoglobin needed to be 7.5 or greater. CSW notified MD Hongalgi and pt's hemoglobin was rechecked. Pt's hemoglobin was 7.2, then instructed to transfuse the pt and recheck hemoglobin. Once CSW made [facility] aware she stated that the MD would like to hold off on admission to be sure the pt's hemoglobin is trending up since they are unable to give blood in the [place] notified MD [facility] and [facility] Supervisor Jolene Provost.  CSW was instructed by [facility] supervisor to offer a peer to peer with MD Algis Liming as well as MD Mclean at [facility]. CSW provided MD Hangalgi's number to Lindwood Coke to pass along to MD Mclean.   Pt's insurance authorization expires today so pt will now need a new auth,. Sharyn Lull notified CSW that the department will begin the new auth asap.    Expected Discharge Plan: [place] Barriers to Discharge: Continued Medical Work up  Expected Discharge Plan and Services Expected Discharge Plan: [place] In-house Referral: Clinical Social Work Discharge Planning Services: CM Consult Post Acute Care Choice: [place] arrangements for the past 2 months: Single Family Home Expected Discharge Date: 12/17/19               DME Arranged: N/A DME Agency:  NA         HH Agency: [place] ([facility])         Social Determinants of Health (SDOH) Interventions    Readmission Risk Interventions Readmission Risk Prevention Plan 12/06/2019 11/24/2019  Transportation Screening Complete Complete  PCP or Specialist Appt within 3-5 Days - Complete  HRI or [place] Complete Complete  Social Work Consult for [place] Planning/Counseling Complete Complete  Palliative Care Screening Not Applicable Not Applicable  Medication Review Press photographer) - Referral to Pharmacy  Some recent data might be hidden

## 2019-12-17 NOTE — Progress Notes (Signed)
Addendum  TOC team over the last few days advised that patient was supposed to DC to SNF today after HD. DC paperwork was done today but SNF refused to take d/t unclear reasons.  I was informed that they would not take him unless hemoglobin was 7.5 or more.  His hemoglobin was 7.2 today.  I discussed with nephrology with plans to transfuse 1 unit PRBC.  Then I was informed by TOC that they will not take him no matter what until his hemoglobin is stable (his hemoglobin has been stable in the low 7 g range since 11/18). Ideally we do not transfuse patients unless hemoglobin is 7 g or lower in the absence of unstable cardiac issues or active bleeding neither of which is the case in this patient.  TOC also advised today that his insurance authorization runs out today and the earliest that this can be reinitiated would be on Monday 11/29.  Vernell Leep, MD, [place], [facility]. [facility]  To contact the attending provider between 7A-7P or the covering provider during after hours 7P-7A, please log into the web site [email or web address] and access using universal [place] password for that web site. If you do not have the password, please call the hospital operator.

## 2019-12-17 NOTE — Progress Notes (Signed)
Patient refusing for nurse to turn UF off at this time. Patient states he can tell when his blood pressure is too low and will request UF to be off.  Patient remains alert and oriented.

## 2019-12-18 MED ORDER — CEPHALEXIN 500 MG PO CAPS
500.0000 mg | ORAL_CAPSULE | Freq: Three times a day (TID) | ORAL | Status: DC
  Administered 2019-12-18 – 2019-12-19 (×3): 500 mg via ORAL
  Filled 2019-12-18 (×3): qty 1

## 2019-12-18 NOTE — Progress Notes (Addendum)
PROGRESS NOTE   [name]  EPP:[id number]    DOB: 09/13/1970    DOA: 12/05/2019  PCP: [facility]   I have briefly reviewed patients previous medical records in [facility].  Chief Complaint  Patient presents with  . Wound Infection  . 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 His case was communicated with ID and he completed antibiotics on 12/09/2019.   Improved and stable.  Medically stable for DC.  Repeat Covid test 11/26: Negative.  In patient rehab refused to take patient on 11/27.  Awaiting TOC team to work him up for Inpatient Rehab again   Assessment & Plan:  Principal Problem:   Hyperkalemia Active Problems:   Cellulitis of right lower extremity   ESRD on hemodialysis (HCC)   Systemic lupus erythematosus, unspecified ([place])   History of Renal transplant failure   Pseudomonas aeruginosa wound infection   Hyperkalemia ESRD on hemodialysis (HCC)with history of failed renal transplant -Patient received calcium gluconate and Veltassa in the emergency room -Nephrology following and patient undergoing regular HD. -Plans were for discharge to inpatient rehab after HD on 11/27 but inpatient rehab refused to take him for no clear reasons.   -As per nephrology, at time of discharge: recommended discontinuing Epogen which can be redosed at outpatient HD center, stop bicarbonate and albumin which he was getting here.  Continue Renvela, midodrine and iron  supplements. -Patient has consistently refused to take Veltassa despite counseling.  Discussed with nephrology today regarding stopping it since he is not taking it anyway and potassium postdialysis yesterday was normal.  He took 2 doses of Veltassa on 11/16 but has not taken any since.  Nephrology agrees and recommends stopping Veltassa -Patient reported that he takes Flomax 0.4 mg daily on nondialysis days, prescribed to him from [place] for "prostate problems" which causes urinary urgency even though he does not make urine. Discussed with nephrology and started same.  Pseudomonas aeruginosa wound infection Cellulitis of right lower extremity Wound right lower leg Prior to [facility] MD communicated with infectious disease regarding antibiotic recommendations. Patient completed his last dose of meropenem on 12/09/2019. He has no further indication for systemic antibiotic therapy. Continue wound care per WOCN recommendations.I examined wound on 11/26, appears clean, drying up and no acute findings.  Systemic lupus erythematosus, unspecified ([place]) Continue prednisone per home dose  Body mass index is 26.96 kg/m.   Anemia with chronic kidney disease Hemoglobin stable in the low 7 g range for almost 2 weeks. Periodically follow CBC across HD and transfuse for hemoglobin 7 g or below.    Hemoglobin 7.2 at dialysis yesterday, transfused 1 unit of PRBC and hemoglobin up to 8.2.  Thrombocytopenia: Appears chronic and intermittent.  Platelet count gradually improving, up to 149  Secondary hyperparathyroidism Management per nephrology.  Continue sevelamer.  ?  Soft tissue infection of buttock area Noted on 11/28.  Patient refuses surgical evaluation for potential abscess.  Specifically wants to be treated with Keflex and feels that this  will resolve it like it has in the past.  Started Keflex.   DVT prophylaxis: heparin injection 5,000 Units Start: 12/05/19 2245     Code Status: Full  Code Family Communication: None at bedside. Disposition:  Status is: Inpatient  Remains inpatient appropriate because:Inpatient level of care appropriate due to severity of illness   Dispo: The patient is from: Home              Anticipated d/c is to: SNF              Anticipated d/c date is: Unknown, await TOC input              Patient currently is not medically stable to d/c.        Consultants:   Nephrology  Procedures:   HD  Antimicrobials:    Anti-infectives (From admission, onward)   Start     Dose/Rate Route Frequency Ordered Stop   12/18/19 1300  cephALEXin (KEFLEX) capsule 500 mg        500 mg Oral Every 8 hours 12/18/19 1144 12/25/19 1359   12/07/19 0000  meropenem (MERREM) 500 mg in sodium chloride 0.9 % 100 mL IVPB        500 mg 200 mL/hr over 30 Minutes Intravenous Every 24 hours 12/06/19 1035 12/09/19 0020   12/05/19 2330  meropenem (MERREM) 500 mg in sodium chloride 0.9 % 100 mL IVPB  Status:  Discontinued        500 mg 200 mL/hr over 30 Minutes Intravenous Every 24 hours 12/05/19 2315 12/06/19 1035   12/05/19 2315  meropenem (MERREM) 500 mg in sodium chloride 0.9 % 100 mL IVPB  Status:  Discontinued        500 mg 200 mL/hr over 30 Minutes Intravenous Every 12 hours 12/05/19 2312 12/05/19 2315   12/05/19 2215  meropenem (MERREM) 500 mg in sodium chloride 0.9 % 100 mL IVPB  Status:  Discontinued        500 mg 200 mL/hr over 30 Minutes Intravenous  Once 12/05/19 2202 12/05/19 2203        Subjective:  Patient reported a painful bump on his buttock area.  He reports similar multiple episodes in the past suggestive of skin infection.  Refuses surgical evaluation and states "I want to let any F Surgeon near me".  Reportedly had similar situation a couple years ago which was I&D in the buttock and had a bad experience with poor healing.  Prior to admission, patient reportedly lives by himself and ambulated independently.  Objective:   Vitals:   12/17/19  1847 12/18/19 0027 12/18/19 0759 12/18/19 1256  BP: (!) 104/58 104/60 (!) 103/55 (!) 109/59  Pulse: 76 79 80 74  Resp: 20 16 18 20   Temp: 98.1 F (36.7 C) 98 F (36.7 C) 98.5 F (36.9 C) (!) 97.4 F (36.3 C)  TempSrc:    Oral  SpO2: 100% 98% 95% 98%  Weight:      Height:        General exam: Young male, moderately built and nourished lying comfortably propped up in bed without distress. Respiratory system: Clear to auscultation.  No increased work of breathing Cardiovascular system: S1 & S2 heard, RRR. No JVD, murmurs, rubs, gallops or clicks. No pedal edema. Gastrointestinal system: Abdomen is nondistended, soft and nontender. No organomegaly or masses felt. Normal bowel sounds heard. Central nervous system: Alert and oriented. No focal neurological deficits. Extremities: Symmetric 5 x 5 power.    I  examined right leg/mid shin wound on 11/26, appears clean, drying up and no acute findings. Left upper arm AV fistula with good thrill. Skin: Approximately 2 x 1 cm open vertically oblong area of swelling/tenderness along the left gluteal cleft fold, no pointing, unsure about fluctuance but no redness or increased warmth.  No drainage. Psychiatry: Judgement and insight appear normal. Mood & affect appropriate.     Data Reviewed:   I have personally reviewed following labs and imaging studies   CBC: Recent Labs  Lab 12/13/19 1000 12/13/19 1000 12/15/19 1000 12/17/19 1116 12/17/19 1908  WBC 6.5  --  5.5 5.4  --   NEUTROABS  --   --  3.9 3.9  --   HGB 7.2*   < > 7.5* 7.2* 8.2*  HCT 23.0*   < > 24.3* 23.4* 26.6*  MCV 95.4  --  96.0 95.9  --   PLT 117*  --  130* 149*  --    < > = values in this interval not displayed.    Basic Metabolic Panel: Recent Labs  Lab 12/13/19 1000 12/15/19 1000 12/17/19 1116  NA 136 138 137  K 6.3* 5.6* 4.2  CL 96* 96* 96*  CO2 29 29 31   GLUCOSE 105* 108* 112*  BUN 53* 43* 28*  CREATININE 6.54* 5.67* 4.21*  CALCIUM 8.9 9.3 9.1  MG  --   2.0  --   PHOS 6.0* 4.9* 3.4    Liver Function Tests: Recent Labs  Lab 12/13/19 1000 12/15/19 1000 12/17/19 1116  ALBUMIN 2.8* 2.9* 2.9*    CBG: No results for input(s): GLUCAP in the last 168 hours.  Microbiology Studies:   Recent Results (from the past 240 hour(s))  Resp Panel by RT-PCR (Flu A&B, Covid) Nasopharyngeal Swab     Status: None   Collection Time: 12/16/19 12:03 PM   Specimen: Nasopharyngeal Swab; Nasopharyngeal(NP) swabs in vial transport medium  Result Value Ref Range Status   SARS Coronavirus 2 by RT PCR NEGATIVE NEGATIVE Final    Comment: (NOTE) SARS-CoV-2 target nucleic acids are NOT DETECTED.  The SARS-CoV-2 RNA is generally detectable in upper respiratory specimens during the acute phase of infection. The lowest concentration of SARS-CoV-2 viral copies this assay can detect is 138 copies/mL. A negative result does not preclude SARS-Cov-2 infection and should not be used as the sole basis for treatment or other patient management decisions. A negative result may occur with  improper specimen collection/handling, submission of specimen other than nasopharyngeal swab, presence of viral mutation(s) within the areas targeted by this assay, and inadequate number of viral copies(<138 copies/mL). A negative result must be combined with clinical observations, patient history, and epidemiological information. The expected result is Negative.  Fact Sheet for Patients:  EntrepreneurPulse.com.au  Fact Sheet for Healthcare Providers:  IncredibleEmployment.be  This test is no t yet approved or cleared by the [place] FDA and  has been authorized for detection and/or diagnosis of SARS-CoV-2 by FDA under an Emergency Use Authorization (EUA). This EUA will remain  in effect (meaning this test can be used) for the duration of the COVID-19 declaration under Section 564(b)(1) of the Act, 21 U.S.C.section 360bbb-3(b)(1), unless  the authorization is terminated  or revoked sooner.       Influenza A by PCR NEGATIVE NEGATIVE Final   Influenza B by PCR NEGATIVE NEGATIVE Final    Comment: (NOTE) The Xpert Xpress SARS-CoV-2/FLU/RSV plus assay is intended as an aid in the diagnosis of influenza from Nasopharyngeal swab  specimens and should not be used as a sole basis for treatment. Nasal washings and aspirates are unacceptable for Xpert Xpress SARS-CoV-2/FLU/RSV testing.  Fact Sheet for Patients: EntrepreneurPulse.com.au  Fact Sheet for Healthcare Providers: IncredibleEmployment.be  This test is not yet approved or cleared by the [place] FDA and has been authorized for detection and/or diagnosis of SARS-CoV-2 by FDA under an Emergency Use Authorization (EUA). This EUA will remain in effect (meaning this test can be used) for the duration of the COVID-19 declaration under Section 564(b)(1) of the Act, 21 U.S.C. section 360bbb-3(b)(1), unless the authorization is terminated or revoked.  Performed at [facility], [address] [place], [place] [address]      Radiology Studies:  No results found.   Scheduled Meds:   . aspirin EC  81 mg Oral Daily  . cephALEXin  500 mg Oral Q8H  . collagenase   Topical Daily  . epoetin (EPOGEN/PROCRIT) injection  10,000 Units Intravenous Q T,Th,Sa-HD  . feeding supplement (NEPRO CARB STEADY)  237 mL Oral BID BM  . ferrous sulfate  325 mg Oral BID  . heparin  5,000 Units Subcutaneous Q8H  . midodrine  10 mg Oral TID WC  . multivitamin  1 tablet Oral QHS  . pantoprazole  40 mg Oral BID  . patiromer  25.2 g Oral Daily  . predniSONE  10 mg Oral Daily  . sevelamer carbonate  800 mg Oral BID WC  . sodium bicarbonate  1,300 mg Oral BID  . tamsulosin  0.4 mg Oral Once per day on Sun Mon Wed Fri  . traZODone  50 mg Oral QHS    Continuous Infusions:   . albumin human 25 g (12/17/19 1111)     LOS: 12 days      Vernell Leep, MD, [place], [facility]. [facility]    To contact the attending provider between 7A-7P or the covering provider during after hours 7P-7A, please log into the web site [email or web address] and access using universal [place] password for that web site. If you do not have the password, please call the hospital operator.  12/18/2019, 1:38 PM

## 2019-12-18 NOTE — Progress Notes (Signed)
[facility]  ROUNDING NOTE   Subjective:    Hemodialysis treatment yesterday. Tolerated treatment well. UF of 3 liters.  PRBC transfusion yesterday.  Was scheduled for discharge but this did not happen.   Objective:  Vital signs in last 24 hours:  Temp:  [97.5 F (36.4 C)-98.5 F (36.9 C)] 98.5 F (36.9 C) (11/28 0759) Pulse Rate:  [65-80] 80 (11/28 0759) Resp:  [15-21] 18 (11/28 0759) BP: (89-115)/(46-67) 103/55 (11/28 0759) SpO2:  [95 %-100 %] 95 % (11/28 0759)  Weight change:  Filed Weights   12/05/19 1757  Weight: 95.3 kg    Intake/Output: I/O last 3 completed shifts: In: 2440 [P.O.:700; Blood:337] Out: 3000 [Other:3000]   Intake/Output this shift:  No intake/output data recorded.  Physical Exam: General:  In no acute distress, laying in bed  Head:  Normocephalic,atraumatic  Eyes:  Anicteric  Lungs:   Lungs clear   Heart: Irregular  Abdomen:  Soft, non tender,non distended  Extremities:  Lower extremity edema 1+  Neurologic:  oriented x 3  Skin: Rt leg with dressing clean,dry and intact  Access: Left AVG +bruit,+thrill    Basic Metabolic Panel: Recent Labs  Lab 12/13/19 1000 12/15/19 1000 12/17/19 1116  NA 136 138 137  K 6.3* 5.6* 4.2  CL 96* 96* 96*  CO2 29 29 31   GLUCOSE 105* 108* 112*  BUN 53* 43* 28*  CREATININE 6.54* 5.67* 4.21*  CALCIUM 8.9 9.3 9.1  MG  --  2.0  --   PHOS 6.0* 4.9* 3.4    Liver Function Tests: Recent Labs  Lab 12/13/19 1000 12/15/19 1000 12/17/19 1116  ALBUMIN 2.8* 2.9* 2.9*   No results for input(s): LIPASE, AMYLASE in the last 168 hours. No results for input(s): AMMONIA in the last 168 hours.  CBC: Recent Labs  Lab 12/13/19 1000 12/15/19 1000 12/17/19 1116 12/17/19 1908  WBC 6.5 5.5 5.4  --   NEUTROABS  --  3.9 3.9  --   HGB 7.2* 7.5* 7.2* 8.2*  HCT 23.0* 24.3* 23.4* 26.6*  MCV 95.4 96.0 95.9  --   PLT 117* 130* 149*  --     Cardiac Enzymes: No results for input(s): CKTOTAL, CKMB,  CKMBINDEX, TROPONINI in the last 168 hours.  BNP: Invalid input(s): POCBNP  CBG: No results for input(s): GLUCAP in the last 168 hours.  Microbiology: Results for orders placed or performed during the hospital encounter of 12/05/19  Respiratory Panel by RT PCR (Flu A&B, Covid) - Nasopharyngeal Swab     Status: None   Collection Time: 12/05/19 10:04 PM   Specimen: Nasopharyngeal Swab  Result Value Ref Range Status   SARS Coronavirus 2 by RT PCR NEGATIVE NEGATIVE Final    Comment: (NOTE) SARS-CoV-2 target nucleic acids are NOT DETECTED.  The SARS-CoV-2 RNA is generally detectable in upper respiratoy specimens during the acute phase of infection. The lowest concentration of SARS-CoV-2 viral copies this assay can detect is 131 copies/mL. A negative result does not preclude SARS-Cov-2 infection and should not be used as the sole basis for treatment or other patient management decisions. A negative result may occur with  improper specimen collection/handling, submission of specimen other than nasopharyngeal swab, presence of viral mutation(s) within the areas targeted by this assay, and inadequate number of viral copies (<131 copies/mL). A negative result must be combined with clinical observations, patient history, and epidemiological information. The expected result is Negative.  Fact Sheet for Patients:  PinkCheek.be  Fact Sheet for Healthcare Providers:  GravelBags.it  This test is no t yet approved or cleared by the [place] and  has been authorized for detection and/or diagnosis of SARS-CoV-2 by FDA under an Emergency Use Authorization (EUA). This EUA will remain  in effect (meaning this test can be used) for the duration of the COVID-19 declaration under Section 564(b)(1) of the Act, 21 U.S.C. section 360bbb-3(b)(1), unless the authorization is terminated or revoked sooner.     Influenza A by PCR NEGATIVE  NEGATIVE Final   Influenza B by PCR NEGATIVE NEGATIVE Final    Comment: (NOTE) The Xpert Xpress SARS-CoV-2/FLU/RSV assay is intended as an aid in  the diagnosis of influenza from Nasopharyngeal swab specimens and  should not be used as a sole basis for treatment. Nasal washings and  aspirates are unacceptable for Xpert Xpress SARS-CoV-2/FLU/RSV  testing.  Fact Sheet for Patients: PinkCheek.be  Fact Sheet for Healthcare Providers: GravelBags.it  This test is not yet approved or cleared by the [place] FDA and  has been authorized for detection and/or diagnosis of SARS-CoV-2 by  FDA under an Emergency Use Authorization (EUA). This EUA will remain  in effect (meaning this test can be used) for the duration of the  Covid-19 declaration under Section 564(b)(1) of the Act, 21  U.S.C. section 360bbb-3(b)(1), unless the authorization is  terminated or revoked. Performed at [facility], [address] [place], [place] [address]   Resp Panel by RT-PCR (Flu A&B, Covid) Nasopharyngeal Swab     Status: None   Collection Time: 12/16/19 12:03 PM   Specimen: Nasopharyngeal Swab; Nasopharyngeal(NP) swabs in vial transport medium  Result Value Ref Range Status   SARS Coronavirus 2 by RT PCR NEGATIVE NEGATIVE Final    Comment: (NOTE) SARS-CoV-2 target nucleic acids are NOT DETECTED.  The SARS-CoV-2 RNA is generally detectable in upper respiratory specimens during the acute phase of infection. The lowest concentration of SARS-CoV-2 viral copies this assay can detect is 138 copies/mL. A negative result does not preclude SARS-Cov-2 infection and should not be used as the sole basis for treatment or other patient management decisions. A negative result may occur with  improper specimen collection/handling, submission of specimen other than nasopharyngeal swab, presence of viral mutation(s) within the areas targeted by this  assay, and inadequate number of viral copies(<138 copies/mL). A negative result must be combined with clinical observations, patient history, and epidemiological information. The expected result is Negative.  Fact Sheet for Patients:  EntrepreneurPulse.com.au  Fact Sheet for Healthcare Providers:  IncredibleEmployment.be  This test is no t yet approved or cleared by the [place] FDA and  has been authorized for detection and/or diagnosis of SARS-CoV-2 by FDA under an Emergency Use Authorization (EUA). This EUA will remain  in effect (meaning this test can be used) for the duration of the COVID-19 declaration under Section 564(b)(1) of the Act, 21 U.S.C.section 360bbb-3(b)(1), unless the authorization is terminated  or revoked sooner.       Influenza A by PCR NEGATIVE NEGATIVE Final   Influenza B by PCR NEGATIVE NEGATIVE Final    Comment: (NOTE) The Xpert Xpress SARS-CoV-2/FLU/RSV plus assay is intended as an aid in the diagnosis of influenza from Nasopharyngeal swab specimens and should not be used as a sole basis for treatment. Nasal washings and aspirates are unacceptable for Xpert Xpress SARS-CoV-2/FLU/RSV testing.  Fact Sheet for Patients: EntrepreneurPulse.com.au  Fact Sheet for Healthcare Providers: IncredibleEmployment.be  This test is not yet approved or cleared by the [place] FDA  and has been authorized for detection and/or diagnosis of SARS-CoV-2 by FDA under an Emergency Use Authorization (EUA). This EUA will remain in effect (meaning this test can be used) for the duration of the COVID-19 declaration under Section 564(b)(1) of the Act, 21 U.S.C. section 360bbb-3(b)(1), unless the authorization is terminated or revoked.  Performed at [facility], [address] [place], [place] [address]     Coagulation Studies: No results for input(s): LABPROT, INR in the last 72  hours.  Urinalysis: No results for input(s): COLORURINE, LABSPEC, PHURINE, GLUCOSEU, HGBUR, BILIRUBINUR, KETONESUR, PROTEINUR, UROBILINOGEN, NITRITE, LEUKOCYTESUR in the last 72 hours.  Invalid input(s): APPERANCEUR    Imaging: No results found.   Medications:   . albumin human 25 g (12/17/19 1111)   . aspirin EC  81 mg Oral Daily  . cephALEXin  500 mg Oral Q8H  . collagenase   Topical Daily  . epoetin (EPOGEN/PROCRIT) injection  10,000 Units Intravenous Q T,Th,Sa-HD  . feeding supplement (NEPRO CARB STEADY)  237 mL Oral BID BM  . ferrous sulfate  325 mg Oral BID  . heparin  5,000 Units Subcutaneous Q8H  . midodrine  10 mg Oral TID WC  . multivitamin  1 tablet Oral QHS  . pantoprazole  40 mg Oral BID  . patiromer  25.2 g Oral Daily  . predniSONE  10 mg Oral Daily  . sevelamer carbonate  800 mg Oral BID WC  . sodium bicarbonate  1,300 mg Oral BID  . tamsulosin  0.4 mg Oral Once per day on Sun Mon Wed Fri  . traZODone  50 mg Oral QHS   acetaminophen **OR** acetaminophen, calcium carbonate, loperamide, ondansetron **OR** ondansetron (ZOFRAN) IV, oxyCODONE, promethazine  Assessment/ Plan:  [name] is a 49 y.o. black male with end stage renal disease on hemodialysis, history of kidney transplant, lupus nephritis, and hypotension who was admitted to [facility] on 12/05/2019 for Hyperkalemia [E87.5] Cellulitis of right lower extremity [N19.166]  # End stage renal disease.  TTS schedule Patient will start his outpatient dialysis on Tuesday 12/20/2019  #Anemia with chronic kidney disease.   Lab Results  Component Value Date   HGB 8.2 (L) 12/17/2019  Continue Epogen 10,000 units TTS Status post PRBC transfusion on 11/27.   #Secondary Hyperparathyroidism Lab Results  Component Value Date   CALCIUM 9.1 12/17/2019   PHOS 3.4 12/17/2019  Will continue monitoring bone mineral metabolism parameters. Not on binders currently.   #Hyperkalemia 1K bath with dialysis  treatment Continues to refuse Patiromer   LOS: 12 Kage Willmann 11/28/202111:59 AM

## 2019-12-18 NOTE — Progress Notes (Signed)
Physical Therapy Treatment Patient Details Name: [name] MRN: [id number] DOB: 04-15-1970 Today's Date: 12/18/2019    History of Present Illness Recently here and left AMA to try and get dialysis off site, unable and returns within a few hours. 2 weeks ago came in secondary to R LE swelling, pain; admitted for management of severe cellulitis of R LE.  Hospital course additionally significant for hypotension, requiring transfer to CCU and pressor support; episode of vtach with synchronized cardioversion (10/30), CRRT (via temp dialysis cath, left) 10/30-11/2.     PT Comments    Pt tolerated treatment well today and was able to improve overall assistance levels, activity tolerance, ambulation distance, and O2 dependence from last session. Pt able to ambulate on RA with maintenance of SpO2 >90%, but desat to 88% post ambulation; with cues PLB pt able to recover to 94%. Pt demonstrates improved balance as he was able to perform self care ADL's in standing and sitting without LOB. Despite progress, pt continues to be limited with meeting goals due to generalized weakness, decreased activity tolerance, increased pain, poor insight into deficits, and decreased safety awareness. Pt agreeable to participate with therapy, but unwilling to consider PT recommendations for safety with treatment and ways to progress mobility for gains towards goals. Pt will continue to benefit from skilled acute PT services to address deficits for return to baseline function. Pt denied CIR, therefore, will recommend SNF at [place].     Follow Up Recommendations  SNF     Equipment Recommendations  Rolling walker with 5" wheels    Recommendations for Other Services       Precautions / Restrictions Precautions Precautions: Fall Precaution Comments: left AVF, distal RLE wound, unstageable pressure ulcer on R heel Restrictions Weight Bearing Restrictions: No    Mobility  Bed Mobility Overal bed mobility: Needs  Assistance       Supine to sit: Supervision;HOB elevated Sit to supine: Mod assist   General bed mobility comments: Supervision for supine>sit transfer with HOB elevated. Mod assist for BLE facilitation onto bed with sit>supine transfer.  Transfers Overall transfer level: Needs assistance Equipment used: Rolling walker (2 wheeled) Transfers: Sit to/from Stand Sit to Stand: From elevated surface;Min guard         General transfer comment: Min guard to stand from elevated bed height x2 with RW. Verbal cues provided for hand placement to ensure safety with transfer.  Ambulation/Gait Ambulation/Gait assistance: Min guard Gait Distance (Feet): 160 Feet Assistive device: Rolling walker (2 wheeled)   Gait velocity: decreased   General Gait Details: Pt required min guard for safety to ambulate with RW. Pt demonstrates slowed cadence, early reciprocal gait, and bil genu recurvatum.   Stairs             Wheelchair Mobility    Modified Rankin (Stroke Patients Only)       Balance Overall balance assessment: Needs assistance Sitting-balance support: No upper extremity supported;Feet supported Sitting balance-Leahy Scale: Good Sitting balance - Comments: Good seated balance at EOB as pt was able to clean BLE in figure 4 position without LOB   Standing balance support: Bilateral upper extremity supported Standing balance-Leahy Scale: Fair Standing balance comment: Fair standing balance in RW due to increased BUE reliance on RW, slowed cadence, and bil genu recurvatum.                            Cognition Arousal/Alertness: Awake/alert Behavior During Therapy: WFL for  tasks assessed/performed Overall Cognitive Status: Within Functional Limits for tasks assessed                                        Exercises Other Exercises Other Exercises: Pt able to perform static standing balance in RW for hygiene self care ADL's, demonstrating wide BOS  and unilateral UE support on RW. Other Exercises: Pt able to perform static seated balance at EOB to clean feet in figure 4 position bil without LOB. Other Exercises: Pt able to ambulate 161ft with RW and CGA for safety on RA. Pt with increased RR, c/o SOB, and increased gait deviations with fatigue. Post ambulation, pt SpO2 at 88%. Pt able to recover to 94% within 30sec with cues for PLB. Other Exercises: Pt able to demonstrate BLE exercises including: glute sets, hip abd/add, heel slides, and quad sets. Pt educated to include ankle pumps and elevate BLE for edema management.    General Comments General comments (skin integrity, edema, etc.): BLE edema      Pertinent Vitals/Pain Pain Score: 3  Pain Location: 3-4/10 RLE pain Pain Intervention(s): Monitored during session    Home Living                      Prior Function            PT Goals (current goals can now be found in the care plan section) Acute Rehab PT Goals Patient Stated Goal: to go to rehab  PT Goal Formulation: With patient Time For Goal Achievement: 12/25/19 Potential to Achieve Goals: Fair Progress towards PT goals: Progressing toward goals    Frequency    Min 2X/week      PT Plan Current plan remains appropriate    Co-evaluation              AM-PAC PT "6 Clicks" Mobility   Outcome Measure  Help needed turning from your back to your side while in a flat bed without using bedrails?: A Little Help needed moving from lying on your back to sitting on the side of a flat bed without using bedrails?: A Lot Help needed moving to and from a bed to a chair (including a wheelchair)?: A Little Help needed standing up from a chair using your arms (e.g., wheelchair or bedside chair)?: A Little Help needed to walk in hospital room?: A Little Help needed climbing 3-5 steps with a railing? : Total 6 Click Score: 15    End of Session Equipment Utilized During Treatment: Gait belt Activity Tolerance:  Patient tolerated treatment well;Patient limited by fatigue Patient left: in bed;with bed alarm set;with call bell/phone within reach (bed in chair position) Nurse Communication: Mobility status PT Visit Diagnosis: Muscle weakness (generalized) (M62.81);Difficulty in walking, not elsewhere classified (R26.2) Pain - Right/Left: Right Pain - part of body: Leg     Time: 1352-1435 PT Time Calculation (min) (ACUTE ONLY): 43 min  Charges:  $Therapeutic Activity: 38-52 mins                     Herminio Commons, PT, DPT 3:54 PM,12/18/19

## 2019-12-18 NOTE — Consult Note (Signed)
  Pharmacy Antibiotic Note  [name] is a 49 y.o. male admitted on 12/05/2019 with cellulitis.  Pharmacy has been consulted for Keflex (cephalexin) dosing.  Plan: Will dose Keflex 500mg  PO q8h x7 days per current renal function.  Height: 6\' 2"  (188 cm) Weight: 95.3 kg (210 lb) IBW/kg (Calculated) : 82.2  Temp (24hrs), Avg:98.1 F (36.7 C), Min:97.5 F (36.4 C), Max:98.5 F (36.9 C)  Recent Labs  Lab 12/13/19 1000 12/15/19 1000 12/17/19 1116  WBC 6.5 5.5 5.4  CREATININE 6.54* 5.67* 4.21*    Estimated Creatinine Clearance: 24.4 mL/min (A) (by C-G formula based on SCr of 4.21 mg/dL (H)).    Allergies  Allergen Reactions  . Ciprofloxacin Rash    skin on hands peeled off "in sheets."  . Sulfa Antibiotics Rash   Antimicrobials this admission: N/A  Microbiology results: N/A  Thank you for allowing pharmacy to be a part of this patient's care.  [name] 12/18/2019 11:44 AM

## 2019-12-19 MED ORDER — CEPHALEXIN 500 MG PO CAPS
500.0000 mg | ORAL_CAPSULE | Freq: Two times a day (BID) | ORAL | Status: DC
  Administered 2019-12-19 – 2019-12-23 (×8): 500 mg via ORAL
  Filled 2019-12-19 (×8): qty 1

## 2019-12-19 NOTE — Progress Notes (Addendum)
PROGRESS NOTE   [name]  IEP:[id number]    DOB: 1971-01-11    DOA: 12/05/2019  PCP: [place]   I have briefly reviewed patients previous medical records in [facility].  Chief Complaint  Patient presents with  . Wound Infection  . 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 His case was communicated with ID and he completed antibiotics on 12/09/2019.   Improved and stable.  Medically stable for DC.  Repeat Covid test 11/26: Negative.  Inpatient rehab refused to take patient on 11/27.  TOC team working him up again for potential DC to inpatient rehab including insurance preauthorization.  Prior to admission, patient lived alone, and stated that he was independent and did not require any assistance to ambulate.  Currently deconditioned from multiple acute on chronic medical conditions.  He is slowly recovering from this.  He is very motivated to proceed with intense rehab so that he can return to his prior level of independent functioning and eventually home.  Thereby inpatient rehab and not SNF is the most appropriate discharge disposition for this patient at this time.  He is medically stable for discharge at this time.     Assessment & Plan:  Principal Problem:   Hyperkalemia Active Problems:   Cellulitis of right lower extremity   ESRD on hemodialysis (HCC)   Systemic lupus erythematosus, unspecified ([place])   History of Renal transplant failure   Pseudomonas  aeruginosa wound infection   Hyperkalemia ESRD on hemodialysis (HCC)with history of failed renal transplant -Patient received calcium gluconate and Veltassa in the emergency room -Nephrology following and patient undergoing regular HD. -Plans were for discharge to inpatient rehab after HD on 11/27 but inpatient rehab refused to take him for no clear reasons.   -As per nephrology, at time of discharge: recommended discontinuing Epogen which can be redosed at outpatient HD center, stop bicarbonate and albumin which he was getting here.  Continue Renvela, midodrine and iron supplements. -Patient has consistently refused to take Veltassa despite counseling.  Discussed with nephrology and stopped it on 11/28, since he was not taking it anyway and potassium postdialysis yesterday was normal.  Patient also informed me today that he is trying to adhere to low potassium diet. -Patient reported that he takes Flomax 0.4 mg daily on nondialysis days, prescribed to him from [place] for "prostate problems" which causes urinary urgency even though he does not make urine. Discussed with nephrology and started same.  Pseudomonas aeruginosa wound infection Cellulitis of right lower extremity Wound right lower leg Prior to [facility] MD communicated with infectious disease regarding antibiotic recommendations. Patient completed his last dose of meropenem on 12/09/2019. He has no further indication for systemic antibiotic therapy. Continue wound care per WOCN recommendations.I examined wound on 11/26, appears clean, drying up and no acute findings.  Systemic lupus erythematosus, unspecified ([place]) Continue prednisone per home dose  Body mass index is 26.96 kg/m.   Anemia with chronic kidney disease Hemoglobin stable in the low 7  g range for almost 2 weeks. Periodically follow CBC across HD and transfuse for hemoglobin 7 g or below.    Hemoglobin 7.2 at dialysis yesterday, transfused 1 unit of PRBC and  hemoglobin up to 8.2.  Thrombocytopenia: Appears chronic and intermittent.  Platelet count gradually improving, up to 149  Secondary hyperparathyroidism Management per nephrology.  Continue sevelamer.  ?  Soft tissue infection of buttock area Noted on 11/28.  Patient refuses surgical evaluation for potential abscess.  Specifically wants to be treated with Keflex and feels that this will resolve it like it has in the past.  Started Keflex.  Patient reports that when his infection is treated notably with Keflex, many times it resolves by itself.  Reports feeling better today but refuses local exam   DVT prophylaxis: heparin injection 5,000 Units Start: 12/05/19 2245     Code Status: Full Code Family Communication: None at bedside. Disposition:  Status is: Inpatient  Remains inpatient appropriate because:Inpatient level of care appropriate due to severity of illness   Dispo: The patient is from: Home              Anticipated d/c is to: SNF              Anticipated d/c date is: Unknown, await TOC input              Patient currently is not medically stable to d/c.        Consultants:   Nephrology  Procedures:   HD  Antimicrobials:    Anti-infectives (From admission, onward)   Start     Dose/Rate Route Frequency Ordered Stop   12/19/19 2200  cephALEXin (KEFLEX) capsule 500 mg        500 mg Oral Every 12 hours 12/19/19 0956     12/18/19 1300  cephALEXin (KEFLEX) capsule 500 mg  Status:  Discontinued        500 mg Oral Every 8 hours 12/18/19 1144 12/19/19 0956   12/07/19 0000  meropenem (MERREM) 500 mg in sodium chloride 0.9 % 100 mL IVPB        500 mg 200 mL/hr over 30 Minutes Intravenous Every 24 hours 12/06/19 1035 12/09/19 0020   12/05/19 2330  meropenem (MERREM) 500 mg in sodium chloride 0.9 % 100 mL IVPB  Status:  Discontinued        500 mg 200 mL/hr over 30 Minutes Intravenous Every 24 hours 12/05/19 2315 12/06/19 1035   12/05/19 2315  meropenem (MERREM) 500 mg in  sodium chloride 0.9 % 100 mL IVPB  Status:  Discontinued        500 mg 200 mL/hr over 30 Minutes Intravenous Every 12 hours 12/05/19 2312 12/05/19 2315   12/05/19 2215  meropenem (MERREM) 500 mg in sodium chloride 0.9 % 100 mL IVPB  Status:  Discontinued        500 mg 200 mL/hr over 30 Minutes Intravenous  Once 12/05/19 2202 12/05/19 2203        Subjective:  No new complaints.  Indicates that his buttock body pain is better but does not want to be examined.  Stated that his leg strength is improving, wants to try and walk more in the halls with a walker and asking if RN can assist him.  Prior to admission, patient reportedly lives by himself and ambulated independently.  Objective:   Vitals:   12/18/19 1535 12/18/19 1949 12/19/19 0608 12/19/19 1149  BP:  (!) 91/58 (!) 113/56 (!) 101/59  Pulse:  Marland Kitchen)  57 69 71  Resp:  18 18 16   Temp:  98 F (36.7 C) 97.8 F (36.6 C) 98 F (36.7 C)  TempSrc:  Oral Oral Oral  SpO2: 94% 92% 100% 98%  Weight:      Height:        General exam: Young male, moderately built and nourished, sitting up comfortably in bed without distress Respiratory system: Clear to auscultation.  No increased work of breathing Cardiovascular system: S1 & S2 heard, RRR. No JVD, murmurs, rubs, gallops or clicks. No pedal edema. Gastrointestinal system: Abdomen is nondistended, soft and nontender. No organomegaly or masses felt. Normal bowel sounds heard. Central nervous system: Alert and oriented. No focal neurological deficits. Extremities: Symmetric 5 x 5 power.    I examined right leg/mid shin wound on 11/26, appears clean, drying up and no acute findings. Left upper arm AV fistula with good thrill. Skin: As examined on 11/28, approximately 2 x 1 cm open vertically oblong area of swelling/tenderness along the left gluteal cleft fold, no pointing, unsure about fluctuance but no redness or increased warmth.  No drainage. Psychiatry: Judgement and insight appear normal. Mood  & affect appropriate.     Data Reviewed:   I have personally reviewed following labs and imaging studies   CBC: Recent Labs  Lab 12/13/19 1000 12/13/19 1000 12/15/19 1000 12/17/19 1116 12/17/19 1908  WBC 6.5  --  5.5 5.4  --   NEUTROABS  --   --  3.9 3.9  --   HGB 7.2*   < > 7.5* 7.2* 8.2*  HCT 23.0*   < > 24.3* 23.4* 26.6*  MCV 95.4  --  96.0 95.9  --   PLT 117*  --  130* 149*  --    < > = values in this interval not displayed.    Basic Metabolic Panel: Recent Labs  Lab 12/13/19 1000 12/15/19 1000 12/17/19 1116  NA 136 138 137  K 6.3* 5.6* 4.2  CL 96* 96* 96*  CO2 29 29 31   GLUCOSE 105* 108* 112*  BUN 53* 43* 28*  CREATININE 6.54* 5.67* 4.21*  CALCIUM 8.9 9.3 9.1  MG  --  2.0  --   PHOS 6.0* 4.9* 3.4    Liver Function Tests: Recent Labs  Lab 12/13/19 1000 12/15/19 1000 12/17/19 1116  ALBUMIN 2.8* 2.9* 2.9*    CBG: No results for input(s): GLUCAP in the last 168 hours.  Microbiology Studies:   Recent Results (from the past 240 hour(s))  Resp Panel by RT-PCR (Flu A&B, Covid) Nasopharyngeal Swab     Status: None   Collection Time: 12/16/19 12:03 PM   Specimen: Nasopharyngeal Swab; Nasopharyngeal(NP) swabs in vial transport medium  Result Value Ref Range Status   SARS Coronavirus 2 by RT PCR NEGATIVE NEGATIVE Final    Comment: (NOTE) SARS-CoV-2 target nucleic acids are NOT DETECTED.  The SARS-CoV-2 RNA is generally detectable in upper respiratory specimens during the acute phase of infection. The lowest concentration of SARS-CoV-2 viral copies this assay can detect is 138 copies/mL. A negative result does not preclude SARS-Cov-2 infection and should not be used as the sole basis for treatment or other patient management decisions. A negative result may occur with  improper specimen collection/handling, submission of specimen other than nasopharyngeal swab, presence of viral mutation(s) within the areas targeted by this assay, and inadequate  number of viral copies(<138 copies/mL). A negative result must be combined with clinical observations, patient history, and epidemiological information. The expected result  is Negative.  Fact Sheet for Patients:  EntrepreneurPulse.com.au  Fact Sheet for Healthcare Providers:  IncredibleEmployment.be  This test is no t yet approved or cleared by the [place] FDA and  has been authorized for detection and/or diagnosis of SARS-CoV-2 by FDA under an Emergency Use Authorization (EUA). This EUA will remain  in effect (meaning this test can be used) for the duration of the COVID-19 declaration under Section 564(b)(1) of the Act, 21 U.S.C.section 360bbb-3(b)(1), unless the authorization is terminated  or revoked sooner.       Influenza A by PCR NEGATIVE NEGATIVE Final   Influenza B by PCR NEGATIVE NEGATIVE Final    Comment: (NOTE) The Xpert Xpress SARS-CoV-2/FLU/RSV plus assay is intended as an aid in the diagnosis of influenza from Nasopharyngeal swab specimens and should not be used as a sole basis for treatment. Nasal washings and aspirates are unacceptable for Xpert Xpress SARS-CoV-2/FLU/RSV testing.  Fact Sheet for Patients: EntrepreneurPulse.com.au  Fact Sheet for Healthcare Providers: IncredibleEmployment.be  This test is not yet approved or cleared by the [place] FDA and has been authorized for detection and/or diagnosis of SARS-CoV-2 by FDA under an Emergency Use Authorization (EUA). This EUA will remain in effect (meaning this test can be used) for the duration of the COVID-19 declaration under Section 564(b)(1) of the Act, 21 U.S.C. section 360bbb-3(b)(1), unless the authorization is terminated or revoked.  Performed at [facility], [address], [place],  [address]      Radiology Studies:  No results found.   Scheduled Meds:   . aspirin EC  81 mg Oral Daily   . cephALEXin  500 mg Oral Q12H  . collagenase   Topical Daily  . epoetin (EPOGEN/PROCRIT) injection  10,000 Units Intravenous Q T,Th,Sa-HD  . feeding supplement (NEPRO CARB STEADY)  237 mL Oral BID BM  . ferrous sulfate  325 mg Oral BID  . heparin  5,000 Units Subcutaneous Q8H  . midodrine  10 mg Oral TID WC  . multivitamin  1 tablet Oral QHS  . pantoprazole  40 mg Oral BID  . predniSONE  10 mg Oral Daily  . sevelamer carbonate  800 mg Oral BID WC  . sodium bicarbonate  1,300 mg Oral BID  . tamsulosin  0.4 mg Oral Once per day on Sun Mon Wed Fri  . traZODone  50 mg Oral QHS    Continuous Infusions:   . albumin human 25 g (12/17/19 1111)     LOS: 13 days     Vernell Leep, MD, [place], [facility]. [facility]    To contact the attending provider between 7A-7P or the covering provider during after hours 7P-7A, please log into the web site [email or web address] and access using universal [place] password for that web site. If you do not have the password, please call the hospital operator.  12/19/2019, 5:59 PM

## 2019-12-19 NOTE — TOC Progression Note (Addendum)
Transition of Care [facility]) - Progression Note    Patient Details  Name: [name] MRN: [id number] Date of Birth: November 21, 1970  [facility] of [facility]) CM/SW Contact  Beverly Sessions, RN  Phone Number: 12/19/2019, 1:59 PM  Clinical Narrative:    Followed up with Jace at [facility].  They will have to initiate insurance auth again.  Awaiting updated PT and OT note to submit.  Both disciplines are aware that I need updated clinical    Expected Discharge Plan: [place] Barriers to Discharge: Continued Medical Work up  Expected Discharge Plan and Services Expected Discharge Plan: [place] In-house Referral: Clinical Social Work Discharge Planning Services: CM Consult Post Acute Care Choice: [place] arrangements for the past 2 months: Single Family Home Expected Discharge Date: 12/17/19               DME Arranged: N/A DME Agency: NA         HH Agency: [place] ([facility])         Social Determinants of Health (SDOH) Interventions    Readmission Risk Interventions Readmission Risk Prevention Plan 12/06/2019 11/24/2019  Transportation Screening Complete Complete  PCP or Specialist Appt within 3-5 Days - Complete  HRI or [place] Complete Complete  Social Work Consult for [place] Planning/Counseling Complete Complete  Palliative Care Screening Not Applicable Not Applicable  Medication Review [employer] photographer) - Referral to Pharmacy  Some recent data might be hidden

## 2019-12-19 NOTE — Progress Notes (Addendum)
[facility]  ROUNDING NOTE   Subjective:    Patient resting in bed, in no acute distress.  He denies shortness of breath, nausea or vomiting.  We will plan for dialysis tomorrow.  Discharge plans and outpatient dialysis arrangements in process.   Objective:  Vital signs in last 24 hours:  Temp:  [97.8 F (36.6 C)-98 F (36.7 C)] 98 F (36.7 C) (11/29 1149) Pulse Rate:  [57-71] 71 (11/29 1149) Resp:  [16-18] 16 (11/29 1149) BP: (91-113)/(56-59) 101/59 (11/29 1149) SpO2:  [92 %-100 %] 98 % (11/29 1149)  Weight change:  Filed Weights   12/05/19 1757  Weight: 95.3 kg    Intake/Output: No intake/output data recorded.   Intake/Output this shift:  Total I/O In: 640 [P.O.:640] Out: -   Physical Exam: General:  Resting in bed, in no acute distress  Head:  Oral mucous membranes moist  Eyes:  Sclera and conjunctiva clear  Lungs:   Normal and symmetrical respiratory effort, lungs clear  Heart: Irregular, S1-S2, no rubs or gallops  Abdomen:  Soft, non tender,non distended  Extremities:  Lower extremity edema 1+  Neurologic:  Awake, alert, oriented  Skin: Rt leg with dressing clean,dry and intact  Access: Left AVG +bruit,+thrill    Basic Metabolic Panel: Recent Labs  Lab 12/13/19 1000 12/15/19 1000 12/17/19 1116  NA 136 138 137  K 6.3* 5.6* 4.2  CL 96* 96* 96*  CO2 29 29 31   GLUCOSE 105* 108* 112*  BUN 53* 43* 28*  CREATININE 6.54* 5.67* 4.21*  CALCIUM 8.9 9.3 9.1  MG  --  2.0  --   PHOS 6.0* 4.9* 3.4    Liver Function Tests: Recent Labs  Lab 12/13/19 1000 12/15/19 1000 12/17/19 1116  ALBUMIN 2.8* 2.9* 2.9*   No results for input(s): LIPASE, AMYLASE in the last 168 hours. No results for input(s): AMMONIA in the last 168 hours.  CBC: Recent Labs  Lab 12/13/19 1000 12/15/19 1000 12/17/19 1116 12/17/19 1908  WBC 6.5 5.5 5.4  --   NEUTROABS  --  3.9 3.9  --   HGB 7.2* 7.5* 7.2* 8.2*  HCT 23.0* 24.3* 23.4* 26.6*  MCV 95.4 96.0 95.9  --    PLT 117* 130* 149*  --     Cardiac Enzymes: No results for input(s): CKTOTAL, CKMB, CKMBINDEX, TROPONINI in the last 168 hours.  BNP: Invalid input(s): POCBNP  CBG: No results for input(s): GLUCAP in the last 168 hours.  Microbiology: Results for orders placed or performed during the hospital encounter of 12/05/19  Respiratory Panel by RT PCR (Flu A&B, Covid) - Nasopharyngeal Swab     Status: None   Collection Time: 12/05/19 10:04 PM   Specimen: Nasopharyngeal Swab  Result Value Ref Range Status   SARS Coronavirus 2 by RT PCR NEGATIVE NEGATIVE Final    Comment: (NOTE) SARS-CoV-2 target nucleic acids are NOT DETECTED.  The SARS-CoV-2 RNA is generally detectable in upper respiratoy specimens during the acute phase of infection. The lowest concentration of SARS-CoV-2 viral copies this assay can detect is 131 copies/mL. A negative result does not preclude SARS-Cov-2 infection and should not be used as the sole basis for treatment or other patient management decisions. A negative result may occur with  improper specimen collection/handling, submission of specimen other than nasopharyngeal swab, presence of viral mutation(s) within the areas targeted by this assay, and inadequate number of viral copies (<131 copies/mL). A negative result must be combined with clinical observations, patient history, and epidemiological  information. The expected result is Negative.  Fact Sheet for Patients:  PinkCheek.be  Fact Sheet for Healthcare Providers:  GravelBags.it  This test is no t yet approved or cleared by the [place] FDA and  has been authorized for detection and/or diagnosis of SARS-CoV-2 by FDA under an Emergency Use Authorization (EUA). This EUA will remain  in effect (meaning this test can be used) for the duration of the COVID-19 declaration under Section 564(b)(1) of the Act, 21 U.S.C. section 360bbb-3(b)(1),  unless the authorization is terminated or revoked sooner.     Influenza A by PCR NEGATIVE NEGATIVE Final   Influenza B by PCR NEGATIVE NEGATIVE Final    Comment: (NOTE) The Xpert Xpress SARS-CoV-2/FLU/RSV assay is intended as an aid in  the diagnosis of influenza from Nasopharyngeal swab specimens and  should not be used as a sole basis for treatment. Nasal washings and  aspirates are unacceptable for Xpert Xpress SARS-CoV-2/FLU/RSV  testing.  Fact Sheet for Patients: PinkCheek.be  Fact Sheet for Healthcare Providers: GravelBags.it  This test is not yet approved or cleared by the [place] FDA and  has been authorized for detection and/or diagnosis of SARS-CoV-2 by  FDA under an Emergency Use Authorization (EUA). This EUA will remain  in effect (meaning this test can be used) for the duration of the  Covid-19 declaration under Section 564(b)(1) of the Act, 21  U.S.C. section 360bbb-3(b)(1), unless the authorization is  terminated or revoked. Performed at [facility], [address] [place], [place] [address]   Resp Panel by RT-PCR (Flu A&B, Covid) Nasopharyngeal Swab     Status: None   Collection Time: 12/16/19 12:03 PM   Specimen: Nasopharyngeal Swab; Nasopharyngeal(NP) swabs in vial transport medium  Result Value Ref Range Status   SARS Coronavirus 2 by RT PCR NEGATIVE NEGATIVE Final    Comment: (NOTE) SARS-CoV-2 target nucleic acids are NOT DETECTED.  The SARS-CoV-2 RNA is generally detectable in upper respiratory specimens during the acute phase of infection. The lowest concentration of SARS-CoV-2 viral copies this assay can detect is 138 copies/mL. A negative result does not preclude SARS-Cov-2 infection and should not be used as the sole basis for treatment or other patient management decisions. A negative result may occur with  improper specimen collection/handling, submission of specimen  other than nasopharyngeal swab, presence of viral mutation(s) within the areas targeted by this assay, and inadequate number of viral copies(<138 copies/mL). A negative result must be combined with clinical observations, patient history, and epidemiological information. The expected result is Negative.  Fact Sheet for Patients:  EntrepreneurPulse.com.au  Fact Sheet for Healthcare Providers:  IncredibleEmployment.be  This test is no t yet approved or cleared by the [place] FDA and  has been authorized for detection and/or diagnosis of SARS-CoV-2 by FDA under an Emergency Use Authorization (EUA). This EUA will remain  in effect (meaning this test can be used) for the duration of the COVID-19 declaration under Section 564(b)(1) of the Act, 21 U.S.C.section 360bbb-3(b)(1), unless the authorization is terminated  or revoked sooner.       Influenza A by PCR NEGATIVE NEGATIVE Final   Influenza B by PCR NEGATIVE NEGATIVE Final    Comment: (NOTE) The Xpert Xpress SARS-CoV-2/FLU/RSV plus assay is intended as an aid in the diagnosis of influenza from Nasopharyngeal swab specimens and should not be used as a sole basis for treatment. Nasal washings and aspirates are unacceptable for Xpert Xpress SARS-CoV-2/FLU/RSV testing.  Fact Sheet for Patients: EntrepreneurPulse.com.au  Fact Sheet for Healthcare Providers: IncredibleEmployment.be  This test is not yet approved or cleared by the [place] FDA and has been authorized for detection and/or diagnosis of SARS-CoV-2 by FDA under an Emergency Use Authorization (EUA). This EUA will remain in effect (meaning this test can be used) for the duration of the COVID-19 declaration under Section 564(b)(1) of the Act, 21 U.S.C. section 360bbb-3(b)(1), unless the authorization is terminated or revoked.  Performed at [facility], [address]  [place], [place] [address]     Coagulation Studies: No results for input(s): LABPROT, INR in the last 72 hours.  Urinalysis: No results for input(s): COLORURINE, LABSPEC, PHURINE, GLUCOSEU, HGBUR, BILIRUBINUR, KETONESUR, PROTEINUR, UROBILINOGEN, NITRITE, LEUKOCYTESUR in the last 72 hours.  Invalid input(s): APPERANCEUR    Imaging: No results found.   Medications:   . albumin human 25 g (12/17/19 1111)   . aspirin EC  81 mg Oral Daily  . cephALEXin  500 mg Oral Q12H  . collagenase   Topical Daily  . epoetin (EPOGEN/PROCRIT) injection  10,000 Units Intravenous Q T,Th,Sa-HD  . feeding supplement (NEPRO CARB STEADY)  237 mL Oral BID BM  . ferrous sulfate  325 mg Oral BID  . heparin  5,000 Units Subcutaneous Q8H  . midodrine  10 mg Oral TID WC  . multivitamin  1 tablet Oral QHS  . pantoprazole  40 mg Oral BID  . predniSONE  10 mg Oral Daily  . sevelamer carbonate  800 mg Oral BID WC  . sodium bicarbonate  1,300 mg Oral BID  . tamsulosin  0.4 mg Oral Once per day on Sun Mon Wed Fri  . traZODone  50 mg Oral QHS   acetaminophen **OR** acetaminophen, calcium carbonate, loperamide, ondansetron **OR** ondansetron (ZOFRAN) IV, oxyCODONE, promethazine  Assessment/ Plan:  [name] is a 49 y.o. black male with end stage renal disease on hemodialysis, history of kidney transplant, lupus nephritis, and hypotension who was admitted to [facility] on 12/05/2019 for Hyperkalemia [E87.5] Cellulitis of right lower extremity [L03.115]  Left arm AVG/TTS  # End stage renal disease.  Volume and electrolyte status acceptable No acute indication for additional dialysis We will continue TTS schedule Plans for outpatient placement are in progress. Midodrine for chronic hypotension  #Anemia with chronic kidney disease.   Lab Results  Component Value Date   HGB 8.2 (L) 12/17/2019  Epogen 10,000 units TTS Received PRBC transfusion during this admission Continue ferrous sulfate  p.o.  #Secondary Hyperparathyroidism Lab Results  Component Value Date   CALCIUM 9.1 12/17/2019   PHOS 3.4 12/17/2019  Patient is on Sevelamer  #Hyperkalemia No labs today We will continue monitoring potassium levels and plan dialysate accordingly   LOS: 13 Princy Raju 11/29/20212:29 PM

## 2019-12-19 NOTE — Progress Notes (Signed)
Physical Therapy Treatment Patient Details Name: [name] MRN: [id number] DOB: 1970-10-28 Today's Date: 12/19/2019    History of Present Illness Recently here and left AMA to try and get dialysis off site, unable and returns within a few hours. 2 weeks ago came in secondary to R LE swelling, pain; admitted for management of severe cellulitis of R LE.  Hospital course additionally significant for hypotension, requiring transfer to CCU and pressor support; episode of vtach with synchronized cardioversion (10/30), CRRT (via temp dialysis cath, left) 10/30-11/2.     PT Comments    Pt EOB with OT upon arrival, ready to walk.  He is able to stand from highly elevated bed per his request and continue x 1 lap around unit with RW and min guard.  Visual weakness BLE's but no LOB or buckling noted.  He requires +1 assist for safety given global weakness. He is unable to bring his LE's back onto the bed on his own and requires mod a x 1 to complete.     Follow Up Recommendations  CIR     Equipment Recommendations  Rolling walker with 5" wheels    Recommendations for Other Services       Precautions / Restrictions Precautions Precautions: Fall Precaution Comments: left AVF, distal RLE wound, unstageable pressure ulcer on R heel Restrictions Weight Bearing Restrictions: No    Mobility  Bed Mobility Overal bed mobility: Needs Assistance Bed Mobility: Rolling Rolling: Supervision   Supine to sit: Supervision;HOB elevated Sit to supine: Mod assist   General bed mobility comments: assist to manage LE's back onto bed  Transfers Overall transfer level: Needs assistance Equipment used: Rolling walker (2 wheeled) Transfers: Sit to/from Stand Sit to Stand: From elevated surface;Min guard         General transfer comment: bed raised high to comspensate for B quad weakness and difficulty standing from lower surfaces  Ambulation/Gait Ambulation/Gait assistance: Min guard Gait  Distance (Feet): 160 Feet Assistive device: Rolling walker (2 wheeled)   Gait velocity: decreased   General Gait Details: Pt required min guard for safety to ambulate with RW. Pt demonstrates slowed cadence, early reciprocal gait, and bil genu recurvatum.   Stairs             Wheelchair Mobility    Modified Rankin (Stroke Patients Only)       Balance Overall balance assessment: Needs assistance Sitting-balance support: No upper extremity supported;Feet supported Sitting balance-Leahy Scale: Good Sitting balance - Comments: no LOB with circle sitting to don B socks   Standing balance support: Bilateral upper extremity supported Standing balance-Leahy Scale: Fair Standing balance comment: Fair standing balance in RW due to increased BUE reliance on RW, slowed cadence, and bil genu recurvatum.                            Cognition Arousal/Alertness: Awake/alert Behavior During Therapy: WFL for tasks assessed/performed Overall Cognitive Status: Within Functional Limits for tasks assessed                                        Exercises      General Comments        Pertinent Vitals/Pain Pain Assessment: No/denies pain    Home Living  Prior Function            PT Goals (current goals can now be found in the care plan section) Acute Rehab PT Goals Patient Stated Goal: to go to rehab  Progress towards PT goals: Progressing toward goals    Frequency    Min 2X/week      PT Plan Discharge plan needs to be updated    Co-evaluation              AM-PAC PT "6 Clicks" Mobility   Outcome Measure  Help needed turning from your back to your side while in a flat bed without using bedrails?: A Little Help needed moving from lying on your back to sitting on the side of a flat bed without using bedrails?: A Lot Help needed moving to and from a bed to a chair (including a wheelchair)?: A Little Help  needed standing up from a chair using your arms (e.g., wheelchair or bedside chair)?: A Little Help needed to walk in hospital room?: A Little Help needed climbing 3-5 steps with a railing? : Total 6 Click Score: 15    End of Session Equipment Utilized During Treatment: Gait belt Activity Tolerance: Patient tolerated treatment well;Patient limited by fatigue Patient left: in bed;with bed alarm set;with call bell/phone within reach Nurse Communication: Mobility status Pain - Right/Left: Right Pain - part of body: Leg     Time: 1457-1515 PT Time Calculation (min) (ACUTE ONLY): 18 min  Charges:  $Gait Training: 8-22 mins                    Chesley Noon, PTA 12/19/19, 3:22 PM

## 2019-12-19 NOTE — Consult Note (Addendum)
  Pharmacy Antibiotic Note  [name] is a 50 y.o. male admitted on 12/05/2019 with cellulitis.  Pharmacy has been consulted for Keflex (cephalexin) dosing.  Plan: Will change to Keflex 500mg  PO q12h x7 days per HD patient.  Height: 6\' 2"  (188 cm) Weight: 95.3 kg (210 lb) IBW/kg (Calculated) : 82.2  Temp (24hrs), Avg:97.7 F (36.5 C), Min:97.4 F (36.3 C), Max:98 F (36.7 C)  Recent Labs  Lab 12/13/19 1000 12/15/19 1000 12/17/19 1116  WBC 6.5 5.5 5.4  CREATININE 6.54* 5.67* 4.21*    Estimated Creatinine Clearance: 24.4 mL/min (A) (by C-G formula based on SCr of 4.21 mg/dL (H)).    Allergies  Allergen Reactions  . Ciprofloxacin Rash    skin on hands peeled off "in sheets."  . Sulfa Antibiotics Rash   Antimicrobials this admission: N/A  Microbiology results: N/A  Thank you for allowing pharmacy to be a part of this patient's care.  Lu Duffel, PharmD, BCPS Clinical Pharmacist 12/19/2019 9:57 AM

## 2019-12-19 NOTE — Progress Notes (Signed)
Pt refuses CPAP 

## 2019-12-19 NOTE — Progress Notes (Signed)
Update: Still waiting on clinical and insurance approval. Contacted Ezekiel Ina at [facility] admissions, she stated as long as [employer] give authorization, patient could start tomorrow. Will continue to follow up.

## 2019-12-19 NOTE — Progress Notes (Signed)
Occupational Therapy Treatment Patient Details Name: [name] MRN: [id number] DOB: [birth year] Today's Date: 12/19/2019    History of present illness Recently here and left AMA to try and get dialysis off site, unable and returns within a few hours. 2 weeks ago came in secondary to R LE swelling, pain; admitted for management of severe cellulitis of R LE.  Hospital course additionally significant for hypotension, requiring transfer to CCU and pressor support; episode of vtach with synchronized cardioversion (10/30), CRRT (via temp dialysis cath, left) 10/30-11/2.    OT comments  Upon entering the room, pt needing encouragement to participate in OT intervention. Pt with no c/o pain this session. Pt is performing supine >sit with supervision and use of bed rails. While seated on EOB, pt obtains hospital gown with set up A and utilizing circle sitting technique to don B socks on EOB. Pt needing increased time and assistance to pull socks over B heels secondary to edema. Pt needing min cuing for pursed lip breathing technique and no LOB while seated on EOB. BP checked with results of 106/71 and PTA arrived to ambulate with pt . Smooth hand off at end of session. Pt would continue to benefit from acute OT intervention with recommendation for inpatient rehab at discharge.  Follow Up Recommendations  Supervision/Assistance - 24 hour;Other (comment) (inpt rehab)    Equipment Recommendations  Other (comment) (defer to next venue of care)       Precautions / Restrictions Precautions Precautions: Fall Precaution Comments: left AVF, distal RLE wound, unstageable pressure ulcer on R heel       Mobility Bed Mobility Overal bed mobility: Needs Assistance Bed Mobility: Rolling Rolling: Supervision   Supine to sit: Supervision;HOB elevated     General bed mobility comments: use of bed rails with min cuing for techique     Balance Overall balance assessment: Needs assistance Sitting-balance  support: No upper extremity supported;Feet supported Sitting balance-Leahy Scale: Good Sitting balance - Comments: no LOB with circle sitting to don B socks         ADL either performed or assessed with clinical judgement   ADL Overall ADL's : Needs assistance/impaired      Upper Body Dressing : Bed level;Supervision/safety;Sitting Upper Body Dressing Details (indicate cue type and reason): to don hospital gown Lower Body Dressing: Minimal assistance;Sitting/lateral leans Lower Body Dressing Details (indicate cue type and reason): min A for B sock and shoes          Vision Patient Visual Report: No change from baseline            Cognition Arousal/Alertness: Awake/alert Behavior During Therapy: WFL for tasks assessed/performed Overall Cognitive Status: Within Functional Limits for tasks assessed                      Pertinent Vitals/ Pain       Pain Assessment: No/denies pain         Frequency  Min 2X/week        Progress Toward Goals  OT Goals(current goals can now be found in the care plan section)  Progress towards OT goals: Progressing toward goals  Acute Rehab OT Goals Patient Stated Goal: to go to rehab  OT Goal Formulation: With patient Time For Goal Achievement: 12/21/19 Potential to Achieve Goals: Good  Plan Discharge plan remains appropriate;Frequency remains appropriate       AM-PAC OT "6 Clicks" Daily Activity     Outcome Measure   Help from another  person eating meals?: None Help from another person taking care of personal grooming?: A Little Help from another person toileting, which includes using toliet, bedpan, or urinal?: A Lot Help from another person bathing (including washing, rinsing, drying)?: A Little Help from another person to put on and taking off regular upper body clothing?: A Little Help from another person to put on and taking off regular lower body clothing?: A Lot 6 Click Score: 17    End of Session    OT  Visit Diagnosis: Unsteadiness on feet (R26.81);Muscle weakness (generalized) (M62.81)   Activity Tolerance Patient tolerated treatment well   Patient Left in bed;with call bell/phone within reach;with bed alarm set   Nurse Communication Mobility status;Precautions        Time: 5784-6962 OT Time Calculation (min): 12 min  Charges: OT General Charges $OT Visit: 1 Visit OT Treatments $Self Care/Home Management : 8-22 mins  Darleen Crocker, MS, OTR/L , CBIS ascom [phone]  12/19/19, 3:10 PM

## 2019-12-19 NOTE — TOC Progression Note (Signed)
Transition of Care [facility]) - Progression Note    Patient Details  Name: [name] MRN: [id number] Date of Birth: Sep 02, 1970  Transition of Care [facility]) CM/SW Contact  Beverly Sessions, RN Phone Number: 12/19/2019, 3:55 PM  Clinical Narrative:    Clinical faxed to [place] at [facility]   Expected Discharge Plan: [place] Barriers to Discharge: Continued Medical Work up  Expected Discharge Plan and Services Expected Discharge Plan: [place] In-house Referral: Clinical Social Work Discharge Planning Services: CM Consult Post Acute Care Choice: [place] arrangements for the past 2 months: Single Family Home Expected Discharge Date: 12/17/19               DME Arranged: N/A DME Agency: NA         [place] Agency: [place] ([facility])         Social Determinants of Health (SDOH) Interventions    Readmission Risk Interventions Readmission Risk Prevention Plan 12/06/2019 11/24/2019  Transportation Screening Complete Complete  PCP or Specialist Appt within 3-5 Days - Complete  HRI or [place] Complete Complete  Social Work Consult for [place] Planning/Counseling Complete Complete  Palliative Care Screening Not Applicable Not Applicable  Medication Review [employer]) - Referral to Pharmacy  Some recent data might be hidden

## 2019-12-20 LAB — TYPE AND SCREEN
ABO/RH(D): O POS
Antibody Screen: NEGATIVE
Donor AG Type: NEGATIVE
Unit division: 0
Unit division: 0
Unit division: 0

## 2019-12-20 LAB — BPAM RBC
Blood Product Expiration Date: 202112232359
Blood Product Expiration Date: 202201012359
Blood Product Expiration Date: 202201012359
ISSUE DATE / TIME: 202111271542
Unit Type and Rh: 5100
Unit Type and Rh: 5100
Unit Type and Rh: 5100

## 2019-12-20 LAB — HEPATITIS B SURFACE ANTIGEN: Hepatitis B Surface Ag: NONREACTIVE

## 2019-12-20 MED ORDER — CEPHALEXIN 500 MG PO CAPS: 500.0000 mg | ORAL_CAPSULE | Freq: Two times a day (BID) | ORAL | Status: AC

## 2019-12-20 NOTE — Progress Notes (Signed)
12/13/19 1015 12/13/19 1030 12/13/19 1045  Vitals  BP 109/71 (!) 97/55 (!) 103/53  MAP (mmHg)  --  70 (!) 63  BP Location Right Arm Right Arm Right Arm  BP Method Automatic Automatic Automatic  Patient Position (if appropriate) Lying Lying Lying  Pulse Rate  --  66 62  ECG Heart Rate  --  66 67  Resp  --  13 15  During Hemodialysis Assessment  Blood Flow Rate (mL/min) 400 mL/min 400 mL/min 400 mL/min  Arterial Pressure (mmHg) -140 mmHg -140 mmHg -140 mmHg  Venous Pressure (mmHg) 150 mmHg 150 mmHg 150 mmHg  Transmembrane Pressure (mmHg) 80 mmHg 80 mmHg 80 mmHg  Ultrafiltration Rate (mL/min) 720 mL/min 720 mL/min 720 mL/min  Dialysate Flow Rate (mL/min) 600 ml/min 600 ml/min 600 ml/min  Conductivity: Machine  13.6 13.6 13.6  HD Safety Checks Performed Yes Yes Yes  Dialysate Change  --   --   --   Intra-Hemodialysis Comments Tx initiated Progressing as prescribed Progressing as prescribed  Note  Observations  --   --   --   Fistula / Graft Left Upper arm Arteriovenous fistula  No Placement Date or Time found.   Placed prior to admission: Yes  Orientation: Left  Access Location: Upper arm  Access Type: Arteriovenous fistula  Site Condition No complications No complications No complications  Fistula / Graft Assessment Present;Thrill Present;Thrill;Bruit Present;Thrill;Bruit  Status Accessed;Patent Accessed;Flushed;Patent Accessed;Flushed;Patent    12/13/19 1100 12/13/19 1115 12/13/19 1130  Vitals  BP (!) 105/49 (!) 102/52 (!) 103/58  MAP (mmHg) (!) 64 66 71  BP Location Right Arm  --  Right Arm  BP Method  --  Automatic Automatic  Patient Position (if appropriate) Lying  --  Lying  Pulse Rate (!) 57 (!) 53 64  ECG Heart Rate (!) 58 65 64  Resp 14 19 16   During Hemodialysis Assessment  Blood Flow Rate (mL/min) 400 mL/min 400 mL/min 400 mL/min  Arterial Pressure (mmHg) -140 mmHg -140 mmHg -140 mmHg  Venous Pressure (mmHg) 150 mmHg 150 mmHg 150 mmHg  Transmembrane Pressure  (mmHg) 80 mmHg 80 mmHg 80 mmHg  Ultrafiltration Rate (mL/min) 720 mL/min 720 mL/min 720 mL/min  Dialysate Flow Rate (mL/min) 600 ml/min 600 ml/min 600 ml/min  Conductivity: Machine  13.6 13.6 13.8  HD Safety Checks Performed Yes Yes Yes  Dialysate Change  --  2K  --   Intra-Hemodialysis Comments Progressing as prescribed Progressing as prescribed Progressing as prescribed  Note  Observations Notified by lab K+ 6.3, medical team notified .  [place] changed to K+ 2.5 Ca++ 2.5.  --   Fistula / Graft Left Upper arm Arteriovenous fistula  No Placement Date or Time found.   Placed prior to admission: Yes  Orientation: Left  Access Location: Upper arm  Access Type: Arteriovenous fistula  Site Condition No complications No complications No complications  Fistula / Graft Assessment Present;Thrill;Bruit Present;Thrill;Bruit Present;Thrill;Bruit  Status Accessed;Patent Accessed;Flushed;Patent Accessed;Patent    12/13/19 1145 12/13/19 1200  Vitals  BP (!) 103/50 (!) 101/59  MAP (mmHg) 66 66  BP Location  --   --   BP Method  --   --   Patient Position (if appropriate)  --   --   Pulse Rate (!) 56 (!) 55  ECG Heart Rate (!) 56 (!) 59  Resp 15 13  During Hemodialysis Assessment  Blood Flow Rate (mL/min) 400 mL/min 400 mL/min  Arterial Pressure (mmHg) -140 mmHg -140 mmHg  Venous Pressure (  mmHg) 150 mmHg 150 mmHg  Transmembrane Pressure (mmHg) 80 mmHg 80 mmHg  Ultrafiltration Rate (mL/min) 720 mL/min 720 mL/min  Dialysate Flow Rate (mL/min) 600 ml/min 600 ml/min  Conductivity: Machine  13.8 13.8  HD Safety Checks Performed Yes Yes  Dialysate Change  --   --   Intra-Hemodialysis Comments Progressing as prescribed Progressing as prescribed  Note  Observations  --   --   Fistula / Graft Left Upper arm Arteriovenous fistula  No Placement Date or Time found.   Placed prior to admission: Yes  Orientation: Left  Access Location: Upper arm  Access Type: Arteriovenous fistula  Site Condition No  complications No complications  Fistula / Graft Assessment Present Present;Thrill;Bruit  Status Accessed Accessed;Patent

## 2019-12-20 NOTE — Progress Notes (Signed)
12/15/19 1045 12/15/19 1115 12/15/19 1130  Vitals  BP (!) 113/51 (!) 116/50 (!) 116/56  Pulse Rate 62 61 63  Resp 14 17 19   During Hemodialysis Assessment  Blood Flow Rate (mL/min)  --  400 mL/min 400 mL/min  Arterial Pressure (mmHg)  --  -140 mmHg -140 mmHg  Venous Pressure (mmHg)  --  150 mmHg 150 mmHg  Transmembrane Pressure (mmHg)  --  70 mmHg 70 mmHg  Ultrafiltration Rate (mL/min)  --  750 mL/min 750 mL/min  Dialysate Flow Rate (mL/min)  --  600 ml/min 600 ml/min  Conductivity: Machine   --  13.9 13.9  HD Safety Checks Performed  --  Yes Yes  Intra-Hemodialysis Comments  --  Progressing as prescribed Progressing as prescribed  Post-Hemodialysis Assessment  Rinseback Volume (mL)  --   --   --   Dialyzer Clearance  --   --   --   Duration of HD Treatment -hour(s)  --   --   --   Hemodialysis Intake (mL)  --   --   --   UF Total -Machine (mL)  --   --   --   Net UF (mL)  --   --   --   Tolerated HD Treatment  --   --   --   AVG/AVF Arterial Site Held (minutes)  --   --   --   AVG/AVF Venous Site Held (minutes)  --   --   --   Fistula / Graft Left Upper arm Arteriovenous fistula  No Placement Date or Time found.   Placed prior to admission: Yes  Orientation: Left  Access Location: Upper arm  Access Type: Arteriovenous fistula  Site Condition  --  No complications No complications  Status  --  Accessed Accessed    12/15/19 1145 12/15/19 1200 12/15/19 1215  Vitals  BP 115/60 (!) 116/54 (!) 117/55  Pulse Rate 62 62 61  Resp 19 19 19   During Hemodialysis Assessment  Blood Flow Rate (mL/min) 400 mL/min 400 mL/min 400 mL/min  Arterial Pressure (mmHg) -140 mmHg -140 mmHg -140 mmHg  Venous Pressure (mmHg) 150 mmHg 150 mmHg 150 mmHg  Transmembrane Pressure (mmHg) 70 mmHg 70 mmHg 70 mmHg  Ultrafiltration Rate (mL/min) 750 mL/min 750 mL/min 750 mL/min  Dialysate Flow Rate (mL/min) 600 ml/min 600 ml/min 600 ml/min  Conductivity: Machine  13.9 13.9 13.9  HD Safety Checks Performed  Yes Yes Yes  Intra-Hemodialysis Comments Progressing as prescribed Progressing as prescribed Progressing as prescribed  Post-Hemodialysis Assessment  Rinseback Volume (mL)  --   --   --   Dialyzer Clearance  --   --   --   Duration of HD Treatment -hour(s)  --   --   --   Hemodialysis Intake (mL)  --   --   --   UF Total -Machine (mL)  --   --   --   Net UF (mL)  --   --   --   Tolerated HD Treatment  --   --   --   AVG/AVF Arterial Site Held (minutes)  --   --   --   AVG/AVF Venous Site Held (minutes)  --   --   --   Fistula / Graft Left Upper arm Arteriovenous fistula  No Placement Date or Time found.   Placed prior to admission: Yes  Orientation: Left  Access Location: Upper arm  Access Type: Arteriovenous fistula  Site Condition No complications  No complications No complications  Status Accessed Accessed Accessed    12/15/19 1230 12/15/19 1245  Vitals  BP (!) 112/58 110/60  Pulse Rate (!) 57 (!) 58  Resp 19 19  During Hemodialysis Assessment  Blood Flow Rate (mL/min) 400 mL/min 400 mL/min  Arterial Pressure (mmHg) -140 mmHg -140 mmHg  Venous Pressure (mmHg) 150 mmHg 150 mmHg  Transmembrane Pressure (mmHg) 70 mmHg 70 mmHg  Ultrafiltration Rate (mL/min) 750 mL/min 750 mL/min  Dialysate Flow Rate (mL/min) 600 ml/min 600 ml/min  Conductivity: Machine  13.9 13.9  HD Safety Checks Performed Yes Yes  Intra-Hemodialysis Comments Progressing as prescribed Progressing as prescribed  Post-Hemodialysis Assessment  Rinseback Volume (mL)  --  300 mL  Dialyzer Clearance  --  Lightly streaked  Duration of HD Treatment -hour(s)  --  3 hour(s)  Hemodialysis Intake (mL)  --  500 mL  UF Total -Machine (mL)  --  1600 mL  Net UF (mL)  --  1100 mL  Tolerated HD Treatment  --  Yes  AVG/AVF Arterial Site Held (minutes)  --  5 minutes  AVG/AVF Venous Site Held (minutes)  --  5 minutes  Fistula / Graft Left Upper arm Arteriovenous fistula  No Placement Date or Time found.   Placed prior to  admission: Yes  Orientation: Left  Access Location: Upper arm  Access Type: Arteriovenous fistula  Site Condition No complications No complications  Status Accessed Deaccessed

## 2019-12-20 NOTE — Discharge Summary (Signed)
Physician Discharge Summary  [name] OBS:[id number] DOB: [birth year]  PCP: [facility]  Admitted from: Home Discharged to: SNF  Admit date: 12/05/2019 Discharge date: 12/21/2019.  Recommendations for Outpatient Follow-up:    Follow-up Information    MD at SNF. Schedule an appointment as soon as possible for a visit.   Why: To be seen in 2 to 3 days with repeat labs (CBC & renal panel).  Recommend wound care consultation and follow-up of right leg chronic wound.       Hemodialysis center Follow up on 12/22/2019.   Why: Continue scheduled hemodialysis on Tuesdays, Thursdays and Saturdays.       [place] Schedule an appointment as soon as possible for a visit.   Specialty: General Practice Why: Upon discharge from SNF. Contact information: [place] [address] [phone]                Home Health: None    Equipment/Devices: TBD at SNF    Discharge Condition: Improved and stable   Code Status: Full Code Diet recommendation:  Discharge Diet Orders (From admission, onward)    Start     Ordered   12/17/19 0000  Diet - low sodium heart healthy        12/17/19 1123           Discharge Diagnoses:  Principal Problem:   Hyperkalemia Active Problems:   Cellulitis of right lower extremity   ESRD on hemodialysis (HCC)   Systemic lupus erythematosus, unspecified ([place])   History of Renal transplant failure   Pseudomonas aeruginosa wound infection   Brief Summary: 49 year old male with past medical history significant for ESRD secondary to SLE, 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  His case was communicated with ID and he completed antibiotics on 12/09/2019.    Improved and stable.   Assessment & Plan:   Hyperkalemia ESRD on hemodialysis (HCC)with history of failed renal transplant -Patient received calcium gluconate and Veltassa in the emergency room -Nephrology following and patient undergoing regular HD. -Plans were for discharge to inpatient rehab after HD on 11/27 but inpatient rehab refused to take him for no clear reasons.  -As per nephrology, at time of discharge: recommended discontinuing Epogen which can be redosed at outpatient HD center, stop bicarbonate and albumin which he was getting here. Continue Renvela, midodrine and iron supplements. -Patient has consistently refused to take Veltassa despite counseling.  Discussed with nephrology and stopped it on 11/28, since he was not taking it anyway and potassium postdialysis was normal.  Patient also informed us that he is trying to adhere to low potassium diet. -Patient reported that he takes Flomax 0.4 mg daily on nondialysis days, prescribed to him from [facility] for "prostate problems" which causes urinary urgency even though he does not make urine. Discussed with nephrology and started same.  Pseudomonas aeruginosa wound infection Cellulitis of right lower extremity Wound right lower leg Prior to [facility] MD communicated with infectious disease regarding antibiotic recommendations. Patient completed his last dose of meropenem on 12/09/2019. He has no further indication for systemic antibiotic therapy.  Continue wound care per [facility] recommendations.I examined wound on 11/26, appears clean, drying up and no acute findings.  Systemic lupus erythematosus, unspecified ([place]) Continue prednisone per home dose  Body mass index is 26.96 kg/m.   Anemia with chronic kidney disease Hemoglobin was stable in the low 7 g range for  almost 2 weeks. Periodically follow CBC across HD and transfuse for hemoglobin 7 g or below.  Hemoglobin now up to 8.2 after recent 1 unit PRBC transfusion.  Thrombocytopenia: Appears chronic and intermittent.  Platelet count gradually improving, up to 149  Secondary hyperparathyroidism Management per nephrology.  Continue sevelamer.  Soft tissue infection of buttock area Noted on 11/28.  Patient refuses surgical evaluation for potential abscess.  Specifically wants to be treated with Keflex and feels that this will resolve it like it has in the past.  Started Keflex.  Patient reports that when his infection is treated notably with Keflex, many times it resolves by itself.    Today he reported that his swelling ruptured with some purulent drainage and pain significantly improved.  Complete total 7 days course of Keflex.     Consultants:   Nephrology  Procedures:   HD    Discharge Instructions  Discharge Instructions    Ambulatory referral to Podiatry   Complete by: As directed    Call MD for:  difficulty breathing, headache or visual disturbances   Complete by: As directed    Call MD for:  extreme fatigue   Complete by: As directed    Call MD for:  persistant dizziness or light-headedness   Complete by: As directed    Call MD for:  persistant nausea and vomiting   Complete by: As directed    Call MD for:  redness, tenderness, or signs of infection (pain, swelling, redness, odor or green/yellow discharge around incision site)   Complete by: As directed    Call MD for:  severe uncontrolled pain   Complete by: As directed    Call MD for:  temperature >100.4   Complete by: As directed    Diet - low sodium heart healthy   Complete by: As directed    Discharge wound care:   Complete by: As directed    Apply Santyl to right leg wound Q day, then cover with moist gauze and foam dressing.  (Change foam dressing Q 3 days or PRN soiling.)   Increase activity slowly    Complete by: As directed        Medication List    STOP taking these medications   epoetin alfa 10000 UNIT/ML injection Commonly known as: EPOGEN     TAKE these medications   aspirin EC 81 MG tablet Take 81 mg by mouth daily.   cephALEXin 500 MG capsule Commonly known as: KEFLEX Take 1 capsule (500 mg total) by mouth every 12 (twelve) hours for 4 days. Discontinue after 12/24/2019 doses.   collagenase ointment Commonly known as: SANTYL Apply topically daily. Apply Santyl to right leg wound Q day, then cover with moist gauze and foam dressing.  (Change foam dressing Q 3 days or PRN soiling.)   feeding supplement (NEPRO CARB STEADY) Liqd Take 237 mLs by mouth 2 (two) times daily between meals.   ferrous sulfate 324 (65 Fe) MG Tbec Take 1 tablet (325 mg total) by mouth 2 (two) times daily.   HYDROcodone-acetaminophen 10-325 MG tablet Commonly known as: NORCO Take 1 tablet by mouth every 8 (eight) hours as needed for moderate pain or severe  pain. Do Not Fill Before 11/21/2019 What changed: reasons to take this   loperamide 2 MG capsule Commonly known as: IMODIUM Take 1 capsule (2 mg total) by mouth every 6 (six) hours as needed for diarrhea or loose stools. For 3 to 5 days, then stop   midodrine 10 MG tablet Commonly known as: PROAMATINE Take 1 tablet (10 mg total) by mouth 3 (three) times daily with meals. What changed:   medication strength  how much to take  when to take this   multivitamin Tabs tablet Take 1 tablet by mouth at bedtime.   pantoprazole 40 MG tablet Commonly known as: Protonix Take 1 tablet (40 mg total) by mouth 2 (two) times daily.   predniSONE 10 MG tablet Commonly known as: DELTASONE Take 10 mg by mouth daily.   promethazine 25 MG tablet Commonly known as: PHENERGAN Take 25 mg by mouth every 6 (six) hours as needed for nausea or vomiting.   sevelamer carbonate 800 MG tablet Commonly known as: RENVELA Take 1 tablet (800 mg total) by  mouth 2 (two) times daily with a meal.   tamsulosin 0.4 MG Caps capsule Commonly known as: FLOMAX Once per day on Sun Mon Wed Fri (i.e. nonhemodialysis days). What changed:   how much to take  how to take this  when to take this  reasons to take this  additional instructions   traZODone 50 MG tablet Commonly known as: DESYREL Take 50 mg by mouth at bedtime.      Allergies  Allergen Reactions  . Ciprofloxacin Rash    skin on hands peeled off "in sheets."  . Sulfa Antibiotics Rash      Procedures/Studies:   Subjective: Patient seen this morning at HD.  Reported that his bump on the buttock ruptured draining purulent material and pain has resolved.  Denies any other complaints  Discharge Exam:  Vitals:   12/20/19 1400 12/20/19 1415 12/20/19 1430 12/20/19 1520  BP: (!) 96/59 93/60 119/62 109/70  Pulse:    75  Resp: 10 15 18 16   Temp:    (!) 97.5 F (36.4 C)  TempSrc:    Oral  SpO2: 100% 100% 100%   Weight:      Height:        General exam: Young male, moderately built and nourished, sitting up comfortably in bed without distress Respiratory system: Clear to auscultation.  No increased work of breathing Cardiovascular system: S1 & S2 heard, RRR. No JVD, murmurs, rubs, gallops or clicks. No pedal edema. Gastrointestinal system: Abdomen is nondistended, soft and nontender. No organomegaly or masses felt. Normal bowel sounds heard. Central nervous system: Alert and oriented. No focal neurological deficits. Extremities: Symmetric 5 x 5 power.    I examined right leg/mid shin wound on 11/26, appears clean, drying up and no acute findings. Left upper arm AV fistula with good thrill. Skin: As examined on 11/28, approximately 2 x 1 cm open vertically oblong area of swelling/tenderness along the left gluteal cleft fold, no pointing, unsure about fluctuance but no redness or increased warmth.  No drainage.  Patient not allowing any further exams of the area Psychiatry:  Judgement and insight appear normal. Mood & affect appropriate.      The results of significant diagnostics from this hospitalization (including imaging, microbiology, ancillary and laboratory) are listed below for reference.     Microbiology: Recent Results (from the past 240 hour(s))  Resp Panel by RT-PCR (Flu A&B, Covid) Nasopharyngeal Swab  Status: None   Collection Time: 12/16/19 12:03 PM   Specimen: Nasopharyngeal Swab; Nasopharyngeal(NP) swabs in vial transport medium  Result Value Ref Range Status   SARS Coronavirus 2 by RT PCR NEGATIVE NEGATIVE Final    Comment: (NOTE) SARS-CoV-2 target nucleic acids are NOT DETECTED.  The SARS-CoV-2 RNA is generally detectable in upper respiratory specimens during the acute phase of infection. The lowest concentration of SARS-CoV-2 viral copies this assay can detect is 138 copies/mL. A negative result does not preclude SARS-Cov-2 infection and should not be used as the sole basis for treatment or other patient management decisions. A negative result may occur with  improper specimen collection/handling, submission of specimen other than nasopharyngeal swab, presence of viral mutation(s) within the areas targeted by this assay, and inadequate number of viral copies(<138 copies/mL). A negative result must be combined with clinical observations, patient history, and epidemiological information. The expected result is Negative.  Fact Sheet for Patients:  EntrepreneurPulse.com.au  Fact Sheet for Healthcare Providers:  IncredibleEmployment.be  This test is no t yet approved or cleared by the [place] FDA and  has been authorized for detection and/or diagnosis of SARS-CoV-2 by FDA under an Emergency Use Authorization (EUA). This EUA will remain  in effect (meaning this test can be used) for the duration of the COVID-19 declaration under Section 564(b)(1) of the Act, 21 U.S.C.section  360bbb-3(b)(1), unless the authorization is terminated  or revoked sooner.       Influenza A by PCR NEGATIVE NEGATIVE Final   Influenza B by PCR NEGATIVE NEGATIVE Final    Comment: (NOTE) The Xpert Xpress SARS-CoV-2/FLU/RSV plus assay is intended as an aid in the diagnosis of influenza from Nasopharyngeal swab specimens and should not be used as a sole basis for treatment. Nasal washings and aspirates are unacceptable for Xpert Xpress SARS-CoV-2/FLU/RSV testing.  Fact Sheet for Patients: EntrepreneurPulse.com.au  Fact Sheet for Healthcare Providers: IncredibleEmployment.be  This test is not yet approved or cleared by the [place] FDA and has been authorized for detection and/or diagnosis of SARS-CoV-2 by FDA under an Emergency Use Authorization (EUA). This EUA will remain in effect (meaning this test can be used) for the duration of the COVID-19 declaration under Section 564(b)(1) of the Act, 21 U.S.C. section 360bbb-3(b)(1), unless the authorization is terminated or revoked.  Performed at [facility], [address] [place],  [address]      Labs: CBC: Recent Labs  Lab 12/15/19 1000 12/17/19 1116 12/17/19 1908  WBC 5.5 5.4  --   NEUTROABS 3.9 3.9  --   HGB 7.5* 7.2* 8.2*  HCT 24.3* 23.4* 26.6*  MCV 96.0 95.9  --   PLT 130* 149*  --     Basic Metabolic Panel: Recent Labs  Lab 12/15/19 1000 12/17/19 1116  NA 138 137  K 5.6* 4.2  CL 96* 96*  CO2 29 31  GLUCOSE 108* 112*  BUN 43* 28*  CREATININE 5.67* 4.21*  CALCIUM 9.3 9.1  MG 2.0  --   PHOS 4.9* 3.4    Liver Function Tests: Recent Labs  Lab 12/15/19 1000 12/17/19 1116  ALBUMIN 2.9* 2.9*     Time coordinating discharge: 45 minutes  SIGNED:  Vernell Leep, MD, [place], [facility]. [facility]  To contact the attending provider between 7A-7P or the covering provider during after hours 7P-7A, please log into the web site [email or web address]  and access using universal  password for that web site. If you do not have the password, please  call the hospital operator.

## 2019-12-20 NOTE — Progress Notes (Signed)
PROGRESS NOTE   [name]  OFB:[id number]    DOB: 1970-12-15    DOA: 12/05/2019  PCP: [place]   I have briefly reviewed patients previous medical records in [facility].  Chief Complaint  Patient presents with  . Wound Infection  . 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 His case was communicated with ID and he completed antibiotics on 12/09/2019.   Improved and stable.  Medically stable for DC.  Repeat Covid test 11/26: Negative.  Inpatient rehab refused to take patient on 11/27.  TOC team working him up again for potential DC to inpatient rehab including insurance preauthorization.  Prior to admission, patient lived alone, and stated that he was independent and did not require any assistance to ambulate.  Currently deconditioned from multiple acute on chronic medical conditions.  He is slowly recovering from this.  He is very motivated to proceed with intense rehab so that he can return to his prior level of independent functioning and eventually home.  Thereby inpatient rehab and not SNF is the most appropriate discharge disposition for this patient at this time.  He is medically stable for discharge at this time.     Assessment & Plan:  Principal Problem:   Hyperkalemia Active Problems:   Cellulitis of right lower extremity   ESRD on hemodialysis (HCC)   Systemic lupus erythematosus, unspecified ([place])   History of Renal transplant failure   Pseudomonas  aeruginosa wound infection   Hyperkalemia ESRD on hemodialysis (HCC)with history of failed renal transplant -Patient received calcium gluconate and Veltassa in the emergency room -Nephrology following and patient undergoing regular HD. -Plans were for discharge to inpatient rehab after HD on 11/27 but inpatient rehab refused to take him for no clear reasons.   -As per nephrology, at time of discharge: recommended discontinuing Epogen which can be redosed at outpatient HD center, stop bicarbonate and albumin which he was getting here.  Continue Renvela, midodrine and iron supplements. -Patient has consistently refused to take Veltassa despite counseling.  Discussed with nephrology and stopped it on 11/28, since he was not taking it anyway and potassium postdialysis recently was normal.  Patient also informed me today that he is trying to adhere to low potassium diet. -Patient reported that he takes Flomax 0.4 mg daily on nondialysis days, prescribed to him from [facility] for "prostate problems" which causes urinary urgency even though he does not make urine. Discussed with nephrology and started same. -Seen at hemodialysis this morning.  I discussed with nephrology team who informed me that labs were being drawn at HD but unfortunately none seen.  Will order for tomorrow.  Pseudomonas aeruginosa wound infection Cellulitis of right lower extremity Wound right lower leg Prior to [facility] MD communicated with infectious disease regarding antibiotic recommendations. Patient completed his last dose of meropenem on 12/09/2019. He has no further indication for systemic antibiotic therapy. Continue wound care per WOCN recommendations.I examined wound on 11/26, appears clean, drying up and no acute findings.  Systemic lupus erythematosus, unspecified ([place]) Continue prednisone per home dose  Body mass index is 26.96 kg/m.   Anemia with chronic kidney disease Hemoglobin stable in the low 7 g  range for almost 2 weeks. Periodically follow CBC across HD and transfuse for hemoglobin 7 g or below.    Hemoglobin 8.2 after recent PRBC transfusion.  Follow CBC in a.m.  Thrombocytopenia: Appears chronic and intermittent.  Platelet count gradually improving, up to 149  Secondary hyperparathyroidism Management per nephrology.  Continue sevelamer.  ?  Soft tissue infection of buttock area Noted on 11/28.  Patient refuses surgical evaluation for potential abscess.  Specifically wants to be treated with Keflex and feels that this will resolve it like it has in the past.  Started Keflex.  Patient reports that when his infection is treated notably with Keflex, many times it resolves by itself.  It appears that the boil/?  Abscess ruptured draining pus and he feels better.  Complete 7 days course of Keflex.   DVT prophylaxis: heparin injection 5,000 Units Start: 12/05/19 2245     Code Status: Full Code Family Communication: None at bedside. Disposition:  Status is: Inpatient  Remains inpatient appropriate because:Inpatient level of care appropriate due to severity of illness   Dispo: The patient is from: Home              Anticipated d/c is to: Inpatient rehab.              Anticipated d/c date is: Coordinated multiple times today with TOC team.  I then spoke to [place], [place] from the [facility] and advised her that patient is most appropriate for inpatient rehab level of care rather than SNF and is at this time medically ready for DC to inpatient rehab.  She approved inpatient rehab stay.  As per [facility] team, may be able to discharge to inpatient rehab on 12/1.              Patient currently medically stable for DC.        Consultants:   Nephrology  Procedures:   HD  Antimicrobials:    Anti-infectives (From admission, onward)   Start     Dose/Rate Route Frequency Ordered Stop   12/20/19 0000  cephALEXin (KEFLEX) 500 MG capsule        500 mg Oral Every 12 hours 12/20/19 1837  12/24/19 2359   12/19/19 2200  cephALEXin (KEFLEX) capsule 500 mg        500 mg Oral Every 12 hours 12/19/19 0956 12/24/19 2359   12/18/19 1300  cephALEXin (KEFLEX) capsule 500 mg  Status:  Discontinued        500 mg Oral Every 8 hours 12/18/19 1144 12/19/19 0956   12/07/19 0000  meropenem (MERREM) 500 mg in sodium chloride 0.9 % 100 mL IVPB        500 mg 200 mL/hr over 30 Minutes Intravenous Every 24 hours 12/06/19 1035 12/09/19 0020   12/05/19 2330  meropenem (MERREM) 500 mg in sodium chloride 0.9 % 100 mL IVPB  Status:  Discontinued        500 mg 200 mL/hr over 30 Minutes Intravenous Every 24 hours 12/05/19 2315 12/06/19 1035   12/05/19 2315  meropenem (MERREM) 500 mg in sodium chloride 0.9 % 100 mL IVPB  Status:  Discontinued        500 mg 200 mL/hr over 30 Minutes Intravenous Every 12 hours 12/05/19 2312 12/05/19 2315   12/05/19 2215  meropenem (MERREM)  500 mg in sodium chloride 0.9 % 100 mL IVPB  Status:  Discontinued        500 mg 200 mL/hr over 30 Minutes Intravenous  Once 12/05/19 2202 12/05/19 2203        Subjective:  Seen at HD.  Reports that the lump on his buttock ruptured draining pus.  No pain.  Prior to admission, patient reportedly lives by himself and ambulated independently.  Objective:   Vitals:   12/20/19 1400 12/20/19 1415 12/20/19 1430 12/20/19 1520  BP: (!) 96/59 93/60 119/62 109/70  Pulse:    75  Resp: 10 15 18 16   Temp:    (!) 97.5 F (36.4 C)  TempSrc:    Oral  SpO2: 100% 100% 100%   Weight:      Height:        General exam: Young male, moderately built and nourished, sitting up in bed undergoing HD. Respiratory system: Clear to auscultation.  No increased work of breathing Cardiovascular system: S1 & S2 heard, RRR. No JVD, murmurs, rubs, gallops or clicks. No pedal edema. Gastrointestinal system: Abdomen is nondistended, soft and nontender. No organomegaly or masses felt. Normal bowel sounds heard. Central nervous system: Alert and oriented. No  focal neurological deficits. Extremities: Symmetric 5 x 5 power.    I examined right leg/mid shin wound on 11/26, appears clean, drying up and no acute findings. Left upper arm AV fistula with good thrill. Skin: As examined on 11/28, approximately 2 x 1 cm open vertically oblong area of swelling/tenderness along the left gluteal cleft fold, no pointing, unsure about fluctuance but no redness or increased warmth.  No drainage.  Would not allow reexamination. Psychiatry: Judgement and insight appear normal. Mood & affect appropriate.     Data Reviewed:   I have personally reviewed following labs and imaging studies   CBC: Recent Labs  Lab 12/15/19 1000 12/17/19 1116 12/17/19 1908  WBC 5.5 5.4  --   NEUTROABS 3.9 3.9  --   HGB 7.5* 7.2* 8.2*  HCT 24.3* 23.4* 26.6*  MCV 96.0 95.9  --   PLT 130* 149*  --     Basic Metabolic Panel: Recent Labs  Lab 12/15/19 1000 12/17/19 1116  NA 138 137  K 5.6* 4.2  CL 96* 96*  CO2 29 31  GLUCOSE 108* 112*  BUN 43* 28*  CREATININE 5.67* 4.21*  CALCIUM 9.3 9.1  MG 2.0  --   PHOS 4.9* 3.4    Liver Function Tests: Recent Labs  Lab 12/15/19 1000 12/17/19 1116  ALBUMIN 2.9* 2.9*    CBG: No results for input(s): GLUCAP in the last 168 hours.  Microbiology Studies:   Recent Results (from the past 240 hour(s))  Resp Panel by RT-PCR (Flu A&B, Covid) Nasopharyngeal Swab     Status: None   Collection Time: 12/16/19 12:03 PM   Specimen: Nasopharyngeal Swab; Nasopharyngeal(NP) swabs in vial transport medium  Result Value Ref Range Status   SARS Coronavirus 2 by RT PCR NEGATIVE NEGATIVE Final    Comment: (NOTE) SARS-CoV-2 target nucleic acids are NOT DETECTED.  The SARS-CoV-2 RNA is generally detectable in upper respiratory specimens during the acute phase of infection. The lowest concentration of SARS-CoV-2 viral copies this assay can detect is 138 copies/mL. A negative result does not preclude SARS-Cov-2 infection and should not be  used as the sole basis for treatment or other patient management decisions. A negative result may occur with  improper specimen collection/handling, submission of specimen other than  nasopharyngeal swab, presence of viral mutation(s) within the areas targeted by this assay, and inadequate number of viral copies(<138 copies/mL). A negative result must be combined with clinical observations, patient history, and epidemiological information. The expected result is Negative.  Fact Sheet for Patients:  EntrepreneurPulse.com.au  Fact Sheet for Healthcare Providers:  IncredibleEmployment.be  This test is no t yet approved or cleared by the [place] FDA and  has been authorized for detection and/or diagnosis of SARS-CoV-2 by FDA under an Emergency Use Authorization (EUA). This EUA will remain  in effect (meaning this test can be used) for the duration of the COVID-19 declaration under Section 564(b)(1) of the Act, 21 U.S.C.section 360bbb-3(b)(1), unless the authorization is terminated  or revoked sooner.       Influenza A by PCR NEGATIVE NEGATIVE Final   Influenza B by PCR NEGATIVE NEGATIVE Final    Comment: (NOTE) The Xpert Xpress SARS-CoV-2/FLU/RSV plus assay is intended as an aid in the diagnosis of influenza from Nasopharyngeal swab specimens and should not be used as a sole basis for treatment. Nasal washings and aspirates are unacceptable for Xpert Xpress SARS-CoV-2/FLU/RSV testing.  Fact Sheet for Patients: EntrepreneurPulse.com.au  Fact Sheet for Healthcare Providers: IncredibleEmployment.be  This test is not yet approved or cleared by the [place] FDA and has been authorized for detection and/or diagnosis of SARS-CoV-2 by FDA under an Emergency Use Authorization (EUA). This EUA will remain in effect (meaning this test can be used) for the duration of the COVID-19 declaration under Section  564(b)(1) of the Act, 21 U.S.C. section 360bbb-3(b)(1), unless the authorization is terminated or revoked.  Performed at [facility], [address], [place], [place] [address]      Radiology Studies:  No results found.   Scheduled Meds:   . aspirin EC  81 mg Oral Daily  . cephALEXin  500 mg Oral Q12H  . collagenase   Topical Daily  . epoetin (EPOGEN/PROCRIT) injection  10,000 Units Intravenous Q T,Th,Sa-HD  . feeding supplement (NEPRO CARB STEADY)  237 mL Oral BID BM  . ferrous sulfate  325 mg Oral BID  . heparin  5,000 Units Subcutaneous Q8H  . midodrine  10 mg Oral TID WC  . multivitamin  1 tablet Oral QHS  . pantoprazole  40 mg Oral BID  . predniSONE  10 mg Oral Daily  . sevelamer carbonate  800 mg Oral BID WC  . sodium bicarbonate  1,300 mg Oral BID  . tamsulosin  0.4 mg Oral Once per day on Sun Mon Wed Fri  . traZODone  50 mg Oral QHS    Continuous Infusions:   . albumin human 25 g (12/20/19 1123)     LOS: 14 days     Vernell Leep, MD, [place], [facility]. [facility]    To contact the attending provider between 7A-7P or the covering provider during after hours 7P-7A, please log into the web site [email or web address] and access using universal [place] password for that web site. If you do not have the password, please call the hospital operator.  12/20/2019, 6:43 PM

## 2019-12-20 NOTE — Progress Notes (Signed)
   12/15/19 1000 12/15/19 1015 12/15/19 1030  Vitals  Temp 97.7 F (36.5 C)  --   --   Temp Source Oral  --   --   BP 105/65 (!) 119/57 (!) 110/53  BP Location Right Arm  --   --   BP Method Automatic  --   --   Patient Position (if appropriate) Lying  --   --   Pulse Rate 62 65 63  Pulse Rate Source Monitor  --   --   Resp 17 17 14   During Hemodialysis Assessment  Blood Flow Rate (mL/min) 200 mL/min  --   --   Arterial Pressure (mmHg) -140 mmHg  --   --   Venous Pressure (mmHg) 150 mmHg  --   --   Transmembrane Pressure (mmHg) 70 mmHg  --   --   Ultrafiltration Rate (mL/min) 750 mL/min  --   --   Dialysate Flow Rate (mL/min) 600 ml/min  --   --   Conductivity: Machine  13.9  --   --   HD Safety Checks Performed Yes  --   --   Dialysis Fluid Bolus Normal Saline  --   --   Bolus Amount (mL) 200 mL  --   --   Intra-Hemodialysis Comments Tx initiated  --   --   Fistula / Graft Left Upper arm Arteriovenous fistula  No Placement Date or Time found.   Placed prior to admission: Yes  Orientation: Left  Access Location: Upper arm  Access Type: Arteriovenous fistula  Site Condition No complications No complications No complications  Fistula / Graft Assessment Present;Thrill;Bruit  --   --   Status Accessed Accessed Accessed

## 2019-12-20 NOTE — Plan of Care (Signed)

## 2019-12-20 NOTE — TOC Initial Note (Addendum)
Transition of Care [facility]) - Initial/Assessment Note    Patient Details  Name: TRYCE SURRATT MRN: [id number] Date of Birth: 1970-07-26  [facility]) CM/SW Contact:    Beverly Sessions, RN Phone Number: 12/20/2019, 3:27 PM  Clinical Narrative:                 Initially [facility] denied auth for inpatient re[facility] at [facility] today.  MD completed peer to peer with [facility]. Patient has been approved   [name] still working on outpatient HD placement in [place].    I have set up [employer] for 3pm tomorrow in anticipated of discharge   Per [place] at [place] [phone] patient will have to be admitted at inpatient rehab no later than Friday   Expected Discharge Plan: [place] Barriers to Discharge: Continued Medical Work up   Patient Goals and CMS Choice Patient states their goals for this hospitalization and ongoing recovery are:: Return home with [facility]   Choice offered to / list presented to : NA  Expected Discharge Plan and Services Expected Discharge Plan: [place] In-house Referral: Clinical Social Work Discharge Planning Services: CM Consult Post Acute Care Choice: [place] arrangements for the past 2 months: Single Family Home Expected Discharge Date: 12/17/19               DME Arranged: N/A DME Agency: NA         HH Agency: [place] ([facility])        Prior Living Arrangements/Services Living arrangements for the past 2 months: Single Family Home Lives with:: Self Patient language and need for interpreter reviewed:: Yes Do you feel safe going back to the place where you live?: Yes      Need for Family Participation in Patient Care: No (Comment)   Current home services: DME (rollator and 2 wheel walker) Criminal Activity/Legal Involvement Pertinent to Current Situation/Hospitalization: No - Comment as needed  Activities of Daily Living Home Assistive Devices/Equipment: CPAP ADL Screening (condition at  time of admission) Patient's cognitive ability adequate to safely complete daily activities?: Yes Is the patient deaf or have difficulty hearing?: No Does the patient have difficulty seeing, even when wearing glasses/contacts?: No Does the patient have difficulty concentrating, remembering, or making decisions?: No Patient able to express need for assistance with ADLs?: Yes Does the patient have difficulty dressing or bathing?: No Independently performs ADLs?: Yes (appropriate for developmental age) Does the patient have difficulty walking or climbing stairs?: Yes Weakness of Legs: Both Weakness of Arms/Hands: None  Permission Sought/Granted                  Emotional Assessment Appearance:: Appears stated age Attitude/Demeanor/Rapport: Engaged Affect (typically observed): Accepting Orientation: : Oriented to Self, Oriented to Place, Oriented to Situation, Oriented to  Time Alcohol / Substance Use: Not Applicable Psych Involvement: No (comment)  Admission diagnosis:  Hyperkalemia [E87.5] Cellulitis of right lower extremity [L03.115] Patient Active Problem List   Diagnosis Date Noted  . Hyperkalemia 12/05/2019  . History of Renal transplant failure 12/05/2019  . Pseudomonas aeruginosa wound infection 12/05/2019  . Systemic lupus erythematosus, unspecified ([place]) 11/30/2019  . Paroxysmal atrial fibrillation (HCC)   . Severe sepsis ([place]) 11/17/2019  . Cellulitis of right lower extremity 11/17/2019  . Lactic acidosis 11/17/2019  . Hypoglycemia 11/17/2019  . Anemia   . GERD (gastroesophageal reflux disease)   . ESRD on hemodialysis ([place])   . Murmur 03/25/2018  .  Laboratory examination 03/25/2018  . Tendonitis of both rotator cuffs 12/29/2017  . Greater trochanteric bursitis of both hips 12/29/2017  . Diverticulitis of large intestine without perforation or abscess without bleeding   . Diverticulitis 07/01/2017  . Red blood cell antibody positive, compatible PRBC difficult  to obtain 09/14/2015  . At risk for sepsis 09/08/2015  . GI bleed 12/09/2014   PCP:  [place]:   [id number] [place], [address] [place] [address] Phone: [phone] Fax: [phone]     Social Determinants of Health (SDOH) Interventions    Readmission Risk Interventions Readmission Risk Prevention Plan 12/06/2019 11/24/2019  Transportation Screening Complete Complete  PCP or Specialist Appt within 3-5 Days - Complete  HRI or [place] Complete Complete  Social Work Consult for [place] Planning/Counseling Complete Complete  Palliative Care Screening Not Applicable Not Applicable  Medication Review Press photographer) - Referral to Pharmacy  Some recent data might be hidden

## 2019-12-20 NOTE — Progress Notes (Signed)
12/17/19 1145 12/17/19 1200 12/17/19 1215  Vitals  BP (!) 95/49 (!) 92/53 (!) 91/52  MAP (mmHg) (!) 63 66 (!) 63  BP Location Right Arm Right Arm Right Arm  BP Method Automatic Automatic Automatic  Patient Position (if appropriate) Lying Lying Lying  Pulse Rate 62 70 69  Pulse Rate Source Dinamap Dinamap Dinamap  ECG Heart Rate 63 69 67  Resp 17 (!) 21 20  During Hemodialysis Assessment  Blood Flow Rate (mL/min) 400 mL/min 400 mL/min 400 mL/min  Arterial Pressure (mmHg) -150 mmHg -150 mmHg -150 mmHg  Venous Pressure (mmHg) 140 mmHg 140 mmHg 140 mmHg  Transmembrane Pressure (mmHg) 90 mmHg 90 mmHg 80 mmHg  Ultrafiltration Rate (mL/min) 1170 mL/min 1170 mL/min 880 mL/min  Dialysate Flow Rate (mL/min) 600 ml/min 600 ml/min 600 ml/min  Conductivity: Machine  13.8 13.8 13.8  HD Safety Checks Performed Yes Yes Yes  Dialysis Fluid Bolus  --   --   --   Bolus Amount (mL)  --   --   --   Intra-Hemodialysis Comments Progressing as prescribed Progressing as prescribed Progressing as prescribed  Post-Hemodialysis Assessment  Rinseback Volume (mL)  --   --   --   Dialyzer Clearance  --   --   --   Duration of HD Treatment -hour(s)  --   --   --   Hemodialysis Intake (mL)  --   --   --   UF Total -Machine (mL)  --   --   --   Net UF (mL)  --   --   --   Tolerated HD Treatment  --   --   --   AVG/AVF Arterial Site Held (minutes)  --   --   --   AVG/AVF Venous Site Held (minutes)  --   --   --   Fistula / Graft Left Upper arm Arteriovenous fistula  No Placement Date or Time found.   Placed prior to admission: Yes  Orientation: Left  Access Location: Upper arm  Access Type: Arteriovenous fistula  Site Condition No complications No complications No complications  Status Accessed Accessed Accessed    12/17/19 1230 12/17/19 1245 12/17/19 1300  Vitals  BP (!) 93/54 (!) 92/48 (!) 91/55  MAP (mmHg) 65 (!) 61 (!) 64  BP Location Right Arm Right Arm  --   BP Method Automatic Automatic  --    Patient Position (if appropriate) Lying Lying  --   Pulse Rate 71 66 65  Pulse Rate Source Dinamap Dinamap  --   ECG Heart Rate 69 65 65  Resp 15 17 (!) 21  During Hemodialysis Assessment  Blood Flow Rate (mL/min) 400 mL/min 400 mL/min 400 mL/min  Arterial Pressure (mmHg) -150 mmHg -150 mmHg -150 mmHg  Venous Pressure (mmHg) 140 mmHg 140 mmHg 140 mmHg  Transmembrane Pressure (mmHg) 80 mmHg 80 mmHg 80 mmHg  Ultrafiltration Rate (mL/min) 880 mL/min 780 mL/min 780 mL/min  Dialysate Flow Rate (mL/min) 600 ml/min 600 ml/min 600 ml/min  Conductivity: Machine  13.8 13.8 13.8  HD Safety Checks Performed Yes Yes Yes  Dialysis Fluid Bolus  --   --   --   Bolus Amount (mL)  --   --   --   Intra-Hemodialysis Comments Progressing as prescribed Progressing as prescribed Progressing as prescribed  Post-Hemodialysis Assessment  Rinseback Volume (mL)  --   --   --   Dialyzer Clearance  --   --   --  Duration of HD Treatment -hour(s)  --   --   --   Hemodialysis Intake (mL)  --   --   --   UF Total -Machine (mL)  --   --   --   Net UF (mL)  --   --   --   Tolerated HD Treatment  --   --   --   AVG/AVF Arterial Site Held (minutes)  --   --   --   AVG/AVF Venous Site Held (minutes)  --   --   --   Fistula / Graft Left Upper arm Arteriovenous fistula  No Placement Date or Time found.   Placed prior to admission: Yes  Orientation: Left  Access Location: Upper arm  Access Type: Arteriovenous fistula  Site Condition No complications No complications No complications  Status Accessed Accessed Accessed    12/17/19 1315 12/17/19 1330  Vitals  BP (!) 99/53 (!) 115/46  MAP (mmHg) 66 66  BP Location  --   --   BP Method  --   --   Patient Position (if appropriate)  --   --   Pulse Rate 75 67  Pulse Rate Source  --   --   ECG Heart Rate 67 65  Resp 20 17  During Hemodialysis Assessment  Blood Flow Rate (mL/min) 400 mL/min 200 mL/min  Arterial Pressure (mmHg) -150 mmHg  --   Venous Pressure (mmHg)  140 mmHg  --   Transmembrane Pressure (mmHg) 80 mmHg  --   Ultrafiltration Rate (mL/min) 780 mL/min  --   Dialysate Flow Rate (mL/min) 600 ml/min  --   Conductivity: Machine  13.8  --   HD Safety Checks Performed Yes Yes  Dialysis Fluid Bolus  --  Normal Saline  Bolus Amount (mL)  --  300 mL  Intra-Hemodialysis Comments Progressing as prescribed Tx completed  Post-Hemodialysis Assessment  Rinseback Volume (mL)  --  300 mL  Dialyzer Clearance  --  Clear  Duration of HD Treatment -hour(s)  --  3 hour(s)  Hemodialysis Intake (mL)  --  500 mL  UF Total -Machine (mL)  --  3500 mL  Net UF (mL)  --  3000 mL  Tolerated HD Treatment  --  Yes  AVG/AVF Arterial Site Held (minutes)  --  5 minutes  AVG/AVF Venous Site Held (minutes)  --  5 minutes  Fistula / Graft Left Upper arm Arteriovenous fistula  No Placement Date or Time found.   Placed prior to admission: Yes  Orientation: Left  Access Location: Upper arm  Access Type: Arteriovenous fistula  Site Condition No complications No complications  Status Accessed Deaccessed

## 2019-12-20 NOTE — Progress Notes (Signed)
12/13/19 1215 12/13/19 1230 12/13/19 1245  Vitals  Temp  --   --   --   BP (!) 94/54 (!) 103/59 (!) 99/56  MAP (mmHg) 68 72 (!) 60  Pulse Rate 60 66 68  ECG Heart Rate 61 (!) 56 63  Resp 12 13 12   During Hemodialysis Assessment  Blood Flow Rate (mL/min) 400 mL/min 400 mL/min 400 mL/min  Arterial Pressure (mmHg) -140 mmHg -140 mmHg -140 mmHg  Venous Pressure (mmHg) 150 mmHg 150 mmHg 150 mmHg  Transmembrane Pressure (mmHg) 80 mmHg 80 mmHg 80 mmHg  Ultrafiltration Rate (mL/min) 720 mL/min 720 mL/min 720 mL/min  Dialysate Flow Rate (mL/min) 600 ml/min 600 ml/min 600 ml/min  Conductivity: Machine  13.8 13.8 13.8  HD Safety Checks Performed Yes Yes Yes  KECN  --   --   --   Intra-Hemodialysis Comments Progressing as prescribed Progressing as prescribed Progressing as prescribed  Post-Hemodialysis Assessment  Rinseback Volume (mL)  --   --   --   KECN  --   --   --   Dialyzer Clearance  --   --   --   Duration of HD Treatment -hour(s)  --   --   --   Hemodialysis Intake (mL)  --   --   --   UF Total -Machine (mL)  --   --   --   Net UF (mL)  --   --   --   Tolerated HD Treatment  --   --   --   AVG/AVF Arterial Site Held (minutes)  --   --   --   AVG/AVF Venous Site Held (minutes)  --   --   --   Fistula / Graft Left Upper arm Arteriovenous fistula  No Placement Date or Time found.   Placed prior to admission: Yes  Orientation: Left  Access Location: Upper arm  Access Type: Arteriovenous fistula  Site Condition No complications No complications No complications  Fistula / Graft Assessment Present Present Present  Status Accessed;Patent Accessed;Flushed Accessed;Patent  Drainage Description  --   --   --     12/13/19 1300 12/13/19 1315 12/13/19 1330  Vitals  Temp  --   --   --   BP (!) 108/49 (!) 109/55 (!) 103/55  MAP (mmHg) 66 67 68  Pulse Rate (!) 51 62 61  ECG Heart Rate (!) 55 (!) 59 (!) 57  Resp 15 12 14   During Hemodialysis Assessment  Blood Flow Rate (mL/min) 400  mL/min 400 mL/min 400 mL/min  Arterial Pressure (mmHg) -140 mmHg -140 mmHg -140 mmHg  Venous Pressure (mmHg) 150 mmHg 150 mmHg 150 mmHg  Transmembrane Pressure (mmHg) 80 mmHg 80 mmHg 80 mmHg  Ultrafiltration Rate (mL/min) 720 mL/min 720 mL/min 720 mL/min  Dialysate Flow Rate (mL/min) 600 ml/min 600 ml/min 600 ml/min  Conductivity: Machine  13.8 13.8 13.8  HD Safety Checks Performed Yes Yes Yes  KECN  --   --   --   Intra-Hemodialysis Comments Progressing as prescribed Progressing as prescribed Progressing as prescribed  Post-Hemodialysis Assessment  Rinseback Volume (mL)  --   --   --   KECN  --   --   --   Dialyzer Clearance  --   --   --   Duration of HD Treatment -hour(s)  --   --   --   Hemodialysis Intake (mL)  --   --   --   UF Total -Machine (mL)  --   --   --  Net UF (mL)  --   --   --   Tolerated HD Treatment  --   --   --   AVG/AVF Arterial Site Held (minutes)  --   --   --   AVG/AVF Venous Site Held (minutes)  --   --   --   Fistula / Graft Left Upper arm Arteriovenous fistula  No Placement Date or Time found.   Placed prior to admission: Yes  Orientation: Left  Access Location: Upper arm  Access Type: Arteriovenous fistula  Site Condition No complications No complications No complications  Fistula / Graft Assessment Present Present;Thrill;Bruit Present;Thrill;Bruit  Status Accessed;Patent Accessed;Patent Accessed;Patent  Drainage Description  --   --   --     12/13/19 1345 12/13/19 1400  Vitals  Temp 98.5 F (36.9 C)  --   BP 104/64  --   MAP (mmHg) 76  --   Pulse Rate (!) 51  --   ECG Heart Rate (!) 55  --   Resp 15  --   During Hemodialysis Assessment  Blood Flow Rate (mL/min) 400 mL/min 200 mL/min  Arterial Pressure (mmHg) -140 mmHg  --   Venous Pressure (mmHg) 150 mmHg  --   Transmembrane Pressure (mmHg) 80 mmHg  --   Ultrafiltration Rate (mL/min) 720 mL/min  --   Dialysate Flow Rate (mL/min) 600 ml/min 300 ml/min  Conductivity: Machine  13.9  --   HD  Safety Checks Performed Yes Yes  KECN  --  82.2 KECN  Intra-Hemodialysis Comments Progressing as prescribed  --   Post-Hemodialysis Assessment  Rinseback Volume (mL)  --  300 mL  KECN  --  82.2 V  Dialyzer Clearance  --  Clear  Duration of HD Treatment -hour(s)  --  3.5 hour(s)  Hemodialysis Intake (mL)  --  500 mL  UF Total -Machine (mL)  --  2500 mL  Net UF (mL)  --  2000 mL  Tolerated HD Treatment  --  Yes  AVG/AVF Arterial Site Held (minutes)  --  7 minutes  AVG/AVF Venous Site Held (minutes)  --  7 minutes  Fistula / Graft Left Upper arm Arteriovenous fistula  No Placement Date or Time found.   Placed prior to admission: Yes  Orientation: Left  Access Location: Upper arm  Access Type: Arteriovenous fistula  Site Condition No complications No complications  Fistula / Graft Assessment  --  Present;Thrill;Bruit  Status Accessed Flushed;Patent  Drainage Description  --  None

## 2019-12-20 NOTE — Progress Notes (Deleted)
12/17/19 0710 12/17/19 1015 12/17/19 1025  Hand-Off documentation  Handoff Received Received from shift RN/LPN  --   --   Report received from (Full Name) Neoma Laming, RN  --   --   Vitals  Temp  --   --   --   Temp Source  --   --   --   BP  --   --   --   MAP (mmHg)  --   --   --   BP Location  --   --   --   BP Method  --   --   --   Patient Position (if appropriate)  --   --   --   Pulse Rate  --   --   --   Pulse Rate Source  --   --   --   ECG Heart Rate  --   --   --   Resp  --   --   --   Oxygen Therapy  SpO2  --   --   --   O2 Device  --   --   --   O2 Flow Rate (L/min)  --   --   --   Pain Assessment  Pain Scale  --   --  0-10  Pain Score  --   --  0  Pain Type  --   --   --   Pain Location  --   --   --   Pain Orientation  --   --   --   Pain Descriptors / Indicators  --   --   --   Pain Frequency  --   --   --   Pain Onset  --   --   --   Patients Stated Pain Goal  --   --   --   Pain Intervention(s)  --   --   --   Multiple Pain Sites  --   --   --   2nd Pain Site  Pain Score  --   --   --   Pain Type  --   --   --   Pain Location  --   --   --   Pain Orientation  --   --   --   Pain Descriptors / Indicators  --   --   --   Pain Frequency  --   --   --   Pain Onset  --   --   --   Patient's Stated Pain Goal  --   --   --   Pain Intervention(s)  --   --   --   POSS Scale (Pasero Opioid Sedation Scale)  POSS *See Group Information*  --   --   --   Time-Out for Hemodialysis  What Procedure?  --  hemodialysis  --   Pt Identifiers(min of two)  --  First/Last Name;Pt's DOB(use if MRN/Acct# not available  --   Correct Site?  --  Yes  --   Correct Side?  --  Yes  --   Correct Procedure?  --  Yes  --   Consents Verified?  --  Yes  --   Safety Precautions Reviewed?  --  Yes  --   Engineer, civil (consulting) Number  --  Hamel Number  --  [id number]  --   UF/Alarm Test  --  Passed  --  Conductivity: Meter  --  14  --   Conductivity: Machine   --  13.8  --    pH  --  7.4  --   Normal Saline Lot Number  --  [id number]  --   Dialyzer Lot Number  --  [id number]  --   Disposable Set Lot Number  --  [id number]  --   Dialysate Acid Bath Lot Number  --  [id number]  --   Dialysate HCO3 Bath Lot Number  --  [id number]  --   Machine Temperature  --  98.6 F (37 C)  --   Musician and Audible  --  Yes  --   Blood Lines Intact and Secured  --  Yes  --   Pre Treatment Patient Checks  Vascular access used during treatment  --  Fistula  --   Hepatitis B Surface Antigen Results  --  Negative  --   Date Hepatitis B Surface Antigen Drawn  --  11/19/19  --   Hepatitis B Surface Antibody  --  0  --   Date Hepatitis B Surface Antibody Drawn  --  11/19/19  --   Hemodialysis Consent Verified  --  Yes  --   Hemodialysis Standing Orders Initiated  --  Yes  --   ECG (Telemetry) Monitor On  --  Yes  --   Prime Ordered  --  Normal Saline  --   Length of  DialysisTreatment -hour(s)  --  3 Hour(s)  --   Dialyzer  --  Elisio 17H NR  --   Dialysate  --  1K;2.5 Ca  --   Dialysate Flow Ordered  --  600  --   Blood Flow Rate Ordered  --  400 mL/min  --   Ultrafiltration Goal  --  3 Liters  --   During Hemodialysis Assessment  Blood Flow Rate (mL/min)  --   --   --   Arterial Pressure (mmHg)  --   --   --   Venous Pressure (mmHg)  --   --   --   Transmembrane Pressure (mmHg)  --   --   --   Ultrafiltration Rate (mL/min)  --   --   --   Dialysate Flow Rate (mL/min)  --   --   --   Conductivity: Machine   --   --   --   HD Safety Checks Performed  --   --   --   Dialysis Fluid Bolus  --   --   --   Bolus Amount (mL)  --   --   --   Dialysate Change  --   --   --   Intra-Hemodialysis Comments  --   --   --   Post-Hemodialysis Assessment  Rinseback Volume (mL)  --   --   --   Dialyzer Clearance  --   --   --   Duration of HD Treatment -hour(s)  --   --   --   Hemodialysis Intake (mL)  --   --   --   UF Total -Machine (mL)  --   --   --   Net UF (mL)  --   --   --    Tolerated HD Treatment  --   --   --   AVG/AVF Arterial Site Held (minutes)  --   --   --   AVG/AVF Venous Site Held (minutes)  --   --   --  Education / Care Plan  Dialysis Education Provided  --   --   --     12/17/19 1125 12/17/19 1315 12/17/19 1330  Hand-Off documentation  Handoff Received  --   --   --   Report received from (Full Name)  --   --   --   Vitals  Temp  --   --   --   Temp Source  --   --   --   BP  --   --   --   MAP (mmHg)  --   --   --   BP Location  --   --   --   BP Method  --   --   --   Patient Position (if appropriate)  --   --   --   Pulse Rate  --   --   --   Pulse Rate Source  --   --   --   ECG Heart Rate  --   --  65  Resp  --   --   --   Oxygen Therapy  SpO2  --   --  100 %  O2 Device  --   --   --   O2 Flow Rate (L/min)  --   --   --   Pain Assessment  Pain Scale  --   --   --   Pain Score  --   --   --   Pain Type  --   --   --   Pain Location  --   --   --   Pain Orientation  --   --   --   Pain Descriptors / Indicators  --   --   --   Pain Frequency  --   --   --   Pain Onset  --   --   --   Patients Stated Pain Goal  --   --   --   Pain Intervention(s)  --   --   --   Multiple Pain Sites  --   --   --   2nd Pain Site  Pain Score  --   --   --   Pain Type  --   --   --   Pain Location  --   --   --   Pain Orientation  --   --   --   Pain Descriptors / Indicators  --   --   --   Pain Frequency  --   --   --   Pain Onset  --   --   --   Patient's Stated Pain Goal  --   --   --   Pain Intervention(s)  --   --   --   POSS Scale (Pasero Opioid Sedation Scale)  POSS *See Group Information*  --   --   --   Time-Out for Hemodialysis  What Procedure?  --   --   --   Pt Identifiers(min of two)  --   --   --   Correct Site?  --   --   --   Correct Side?  --   --   --   Correct Procedure?  --   --   --   Consents Verified?  --   --   --   Safety Precautions Reviewed?  --   --   --   Counselling psychologist  Machine Number  --   --   --    Station Number  --   --   --   UF/Alarm Test  --   --   --   Conductivity: Meter  --   --   --   Conductivity: Machine   --   --   --   pH  --   --   --   Normal Saline Lot Number  --   --   --   Dialyzer Lot Number  --   --   --   Disposable Set Lot Number  --   --   --   Dialysate Acid Bath Lot Number  --   --   --   Dialysate HCO3 Bath Lot Number  --   --   --   Machine Temperature  --   --   --   Musician and Audible  --   --   --   Blood Lines Intact and Secured  --   --   --   Pre Treatment Patient Checks  Vascular access used during treatment  --   --   --   Hepatitis B Surface Antigen Results  --   --   --   Date Hepatitis B Surface Antigen Drawn  --   --   --   Hepatitis B Surface Antibody  --   --   --   Date Hepatitis B Surface Antibody Drawn  --   --   --   Hemodialysis Consent Verified  --   --   --   Hemodialysis Standing Orders Initiated  --   --   --   ECG (Telemetry) Monitor On  --   --   --   Prime Ordered  --   --   --   Length of  DialysisTreatment -hour(s)  --   --   --   Dialyzer  --   --   --   Dialysate  --   --   --   Dialysate Flow Ordered  --   --   --   Blood Flow Rate Ordered  --   --   --   Ultrafiltration Goal  --   --   --   During Hemodialysis Assessment  Blood Flow Rate (mL/min)  --   --  200 mL/min  Arterial Pressure (mmHg)  --  -150 mmHg  --   Venous Pressure (mmHg)  --  140 mmHg  --   Transmembrane Pressure (mmHg)  --  80 mmHg  --   Ultrafiltration Rate (mL/min)  --  780 mL/min  --   Dialysate Flow Rate (mL/min)  --  600 ml/min  --   Conductivity: Machine   --  13.8  --   HD Safety Checks Performed  --   --  Yes  Dialysis Fluid Bolus  --   --  Normal Saline  Bolus Amount (mL)  --   --  300 mL  Dialysate Change 2K  --   --   Intra-Hemodialysis Comments  --   --  Tx completed  Post-Hemodialysis Assessment  Rinseback Volume (mL)  --   --  300 mL  Dialyzer Clearance  --   --  Clear  Duration of HD Treatment -hour(s)  --   --  3  hour(s)  Hemodialysis Intake (mL)  --   --  500 mL  UF Total -Machine (mL)  --   --  3500 mL  Net UF (mL)  --   --  3000 mL  Tolerated HD Treatment  --   --  Yes  AVG/AVF Arterial Site Held (minutes)  --   --  5 minutes  AVG/AVF Venous Site Held (minutes)  --   --  5 minutes  Education / Care Plan  Dialysis Education Provided  --   --  Yes    12/17/19 1400 12/17/19 1417 12/17/19 1751  Hand-Off documentation  Handoff Received  --   --   --   Report received from (Full Name)  --   --   --   Vitals  Temp  --  97.8 F (36.6 C)  --   Temp Source  --  Oral Oral  BP  --  115/63  --   MAP (mmHg)  --  76  --   BP Location  --  Right Arm  --   BP Method  --  Automatic  --   Patient Position (if appropriate)  --  Lying  --   Pulse Rate  --  71  --   Pulse Rate Source  --  Monitor  --   ECG Heart Rate  --   --   --   Resp  --  18  --   Oxygen Therapy  SpO2  --   --   --   O2 Device Nasal Cannula  --   --   O2 Flow Rate (L/min) 2 L/min  --  2 L/min  Pain Assessment  Pain Scale  --   --   --   Pain Score  --   --   --   Pain Type  --   --   --   Pain Location  --   --   --   Pain Orientation  --   --   --   Pain Descriptors / Indicators  --   --   --   Pain Frequency  --   --   --   Pain Onset  --   --   --   Patients Stated Pain Goal  --   --   --   Pain Intervention(s)  --   --   --   Multiple Pain Sites  --   --   --   2nd Pain Site  Pain Score  --   --   --   Pain Type  --   --   --   Pain Location  --   --   --   Pain Orientation  --   --   --   Pain Descriptors / Indicators  --   --   --   Pain Frequency  --   --   --   Pain Onset  --   --   --   Patient's Stated Pain Goal  --   --   --   Pain Intervention(s)  --   --   --   POSS Scale (Pasero Opioid Sedation Scale)  POSS *See Group Information*  --   --   --   Time-Out for Hemodialysis  What Procedure?  --   --   --   Pt Identifiers(min of two)  --   --   --   Correct Site?  --   --   --   Correct Side?  --   --    --   Correct Procedure?  --   --   --  Consents Verified?  --   --   --   Safety Precautions Reviewed?  --   --   --   Engineer, civil (consulting) Number  --   --   --   Station Number  --   --   --   UF/Alarm Test  --   --   --   Conductivity: Meter  --   --   --   Conductivity: Machine   --   --   --   pH  --   --   --   Normal Saline Lot Number  --   --   --   Dialyzer Lot Number  --   --   --   Disposable Set Lot Number  --   --   --   Dialysate Acid Bath Lot Number  --   --   --   Dialysate HCO3 Bath Lot Number  --   --   --   Machine Temperature  --   --   --   Musician and Audible  --   --   --   Blood Lines Intact and Secured  --   --   --   Pre Treatment Patient Checks  Vascular access used during treatment  --   --   --   Hepatitis B Surface Antigen Results  --   --   --   Date Hepatitis B Surface Antigen Drawn  --   --   --   Hepatitis B Surface Antibody  --   --   --   Date Hepatitis B Surface Antibody Drawn  --   --   --   Hemodialysis Consent Verified  --   --   --   Hemodialysis Standing Orders Initiated  --   --   --   ECG (Telemetry) Monitor On  --   --   --   Prime Ordered  --   --   --   Length of  DialysisTreatment -hour(s)  --   --   --   Dialyzer  --   --   --   Dialysate  --   --   --   Dialysate Flow Ordered  --   --   --   Blood Flow Rate Ordered  --   --   --   Ultrafiltration Goal  --   --   --   During Hemodialysis Assessment  Blood Flow Rate (mL/min)  --   --   --   Arterial Pressure (mmHg)  --   --   --   Venous Pressure (mmHg)  --   --   --   Transmembrane Pressure (mmHg)  --   --   --   Ultrafiltration Rate (mL/min)  --   --   --   Dialysate Flow Rate (mL/min)  --   --   --   Conductivity: Machine   --   --   --   HD Safety Checks Performed  --   --   --   Dialysis Fluid Bolus  --   --   --   Bolus Amount (mL)  --   --   --   Dialysate Change  --   --   --   Intra-Hemodialysis Comments  --   --   --   Post-Hemodialysis Assessment   Rinseback Volume (mL)  --   --   --  Dialyzer Clearance  --   --   --   Duration of HD Treatment -hour(s)  --   --   --   Hemodialysis Intake (mL)  --   --   --   UF Total -Machine (mL)  --   --   --   Net UF (mL)  --   --   --   Tolerated HD Treatment  --   --   --   AVG/AVF Arterial Site Held (minutes)  --   --   --   AVG/AVF Venous Site Held (minutes)  --   --   --   Education / Care Plan  Dialysis Education Provided  --   --   --     12/17/19 1847 12/17/19 1930 12/17/19 2100  Hand-Off documentation  Handoff Received  --  Received from shift RN/LPN  --   Report received from (Full Name)  --  Tia, RN  --   Vitals  Temp 98.1 F (36.7 C)  --   --   Temp Source  --   --   --   BP (!) 104/58  --   --   MAP (mmHg) 71  --   --   BP Location Right Arm  --   --   BP Method Automatic  --   --   Patient Position (if appropriate) Sitting  --   --   Pulse Rate 76  --   --   Pulse Rate Source Monitor  --   --   ECG Heart Rate  --   --   --   Resp 20  --   --   Oxygen Therapy  SpO2 100 %  --   --   O2 Device Nasal Cannula  --   --   O2 Flow Rate (L/min)  --   --   --   Pain Assessment  Pain Scale  --   --  0-10  Pain Score  --   --   --   Pain Type  --   --  Surgical pain  Pain Location  --   --  Leg  Pain Orientation  --   --  Right  Pain Descriptors / Indicators  --   --  Hervey Ard;Aching  Pain Frequency  --   --  Constant  Pain Onset  --   --  With Activity  Patients Stated Pain Goal  --   --  5  Pain Intervention(s)  --   --  Medication (See eMAR)  Multiple Pain Sites  --   --   --   2nd Pain Site  Pain Score  --   --   --   Pain Type  --   --   --   Pain Location  --   --   --   Pain Orientation  --   --   --   Pain Descriptors / Indicators  --   --   --   Pain Frequency  --   --   --   Pain Onset  --   --   --   Patient's Stated Pain Goal  --   --   --   Pain Intervention(s)  --   --   --   POSS Scale (Pasero Opioid Sedation Scale)  POSS *See Group Information*  --    --   --   Time-Out for Hemodialysis  What Procedure?  --   --   --  Pt Identifiers(min of two)  --   --   --   Correct Site?  --   --   --   Correct Side?  --   --   --   Correct Procedure?  --   --   --   Consents Verified?  --   --   --   Safety Precautions Reviewed?  --   --   --   Engineer, civil (consulting) Number  --   --   --   Station Number  --   --   --   UF/Alarm Test  --   --   --   Conductivity: Meter  --   --   --   Conductivity: Machine   --   --   --   pH  --   --   --   Normal Saline Lot Number  --   --   --   Dialyzer Lot Number  --   --   --   Disposable Set Lot Number  --   --   --   Dialysate Acid Bath Lot Number  --   --   --   Dialysate HCO3 Bath Lot Number  --   --   --   Machine Temperature  --   --   --   Musician and Audible  --   --   --   Blood Lines Intact and Secured  --   --   --   Pre Treatment Patient Checks  Vascular access used during treatment  --   --   --   Hepatitis B Surface Antigen Results  --   --   --   Date Hepatitis B Surface Antigen Drawn  --   --   --   Hepatitis B Surface Antibody  --   --   --   Date Hepatitis B Surface Antibody Drawn  --   --   --   Hemodialysis Consent Verified  --   --   --   Hemodialysis Standing Orders Initiated  --   --   --   ECG (Telemetry) Monitor On  --   --   --   Prime Ordered  --   --   --   Length of  DialysisTreatment -hour(s)  --   --   --   Dialyzer  --   --   --   Dialysate  --   --   --   Dialysate Flow Ordered  --   --   --   Blood Flow Rate Ordered  --   --   --   Ultrafiltration Goal  --   --   --   During Hemodialysis Assessment  Blood Flow Rate (mL/min)  --   --   --   Arterial Pressure (mmHg)  --   --   --   Venous Pressure (mmHg)  --   --   --   Transmembrane Pressure (mmHg)  --   --   --   Ultrafiltration Rate (mL/min)  --   --   --   Dialysate Flow Rate (mL/min)  --   --   --   Conductivity: Machine   --   --   --   HD Safety Checks Performed  --   --   --   Dialysis  Fluid Bolus  --   --   --   Bolus Amount (  mL)  --   --   --   Dialysate Change  --   --   --   Intra-Hemodialysis Comments  --   --   --   Post-Hemodialysis Assessment  Rinseback Volume (mL)  --   --   --   Dialyzer Clearance  --   --   --   Duration of HD Treatment -hour(s)  --   --   --   Hemodialysis Intake (mL)  --   --   --   UF Total -Machine (mL)  --   --   --   Net UF (mL)  --   --   --   Tolerated HD Treatment  --   --   --   AVG/AVF Arterial Site Held (minutes)  --   --   --   AVG/AVF Venous Site Held (minutes)  --   --   --   Education / Care Plan  Dialysis Education Provided  --   --   --     12/17/19 2207 12/18/19 0027 12/18/19 0600  Hand-Off documentation  Handoff Received  --   --   --   Report received from (Full Name)  --   --   --   Vitals  Temp  --  98 F (36.7 C)  --   Temp Source  --   --   --   BP  --  104/60  --   MAP (mmHg)  --  73  --   BP Location  --  Right Arm  --   BP Method  --  Automatic  --   Patient Position (if appropriate)  --  Lying  --   Pulse Rate  --  79  --   Pulse Rate Source  --  Monitor  --   ECG Heart Rate  --   --   --   Resp  --  16  --   Oxygen Therapy  SpO2  --  98 %  --   O2 Device  --  Room Air  --   O2 Flow Rate (L/min)  --   --   --   Pain Assessment  Pain Scale  --   --  0-10  Pain Score 5  --  9  Pain Type  --   --  Acute pain  Pain Location  --   --  Leg  Pain Orientation  --   --  Right  Pain Descriptors / Indicators  --   --   --   Pain Frequency  --   --   --   Pain Onset  --   --   --   Patients Stated Pain Goal  --   --   --   Pain Intervention(s)  --   --  Medication (See eMAR)  Multiple Pain Sites  --   --  Yes  2nd Pain Site  Pain Score  --   --  7  Pain Type  --   --  Acute pain  Pain Location  --   --  Sacrum  Pain Orientation  --   --  Mid  Pain Descriptors / Indicators  --   --  Dull  Pain Frequency  --   --  Constant  Pain Onset  --   --  With Activity  Patient's Stated Pain Goal  --   --  0   Pain Intervention(s)  --   --  Medication (See eMAR)  POSS Scale (Pasero Opioid Sedation Scale)  POSS *See Group Information* 1-Acceptable,Awake and alert  --   --   Time-Out for Hemodialysis  What Procedure?  --   --   --   Pt Identifiers(min of two)  --   --   --   Correct Site?  --   --   --   Correct Side?  --   --   --   Correct Procedure?  --   --   --   Consents Verified?  --   --   --   Safety Precautions Reviewed?  --   --   --   Engineer, civil (consulting) Number  --   --   --   Station Number  --   --   --   UF/Alarm Test  --   --   --   Conductivity: Meter  --   --   --   Conductivity: Machine   --   --   --   pH  --   --   --   Normal Saline Lot Number  --   --   --   Dialyzer Lot Number  --   --   --   Disposable Set Lot Number  --   --   --   Dialysate Acid Bath Lot Number  --   --   --   Dialysate HCO3 Bath Lot Number  --   --   --   Machine Temperature  --   --   --   Musician and Audible  --   --   --   Blood Lines Intact and Secured  --   --   --   Pre Treatment Patient Checks  Vascular access used during treatment  --   --   --   Hepatitis B Surface Antigen Results  --   --   --   Date Hepatitis B Surface Antigen Drawn  --   --   --   Hepatitis B Surface Antibody  --   --   --   Date Hepatitis B Surface Antibody Drawn  --   --   --   Hemodialysis Consent Verified  --   --   --   Hemodialysis Standing Orders Initiated  --   --   --   ECG (Telemetry) Monitor On  --   --   --   Prime Ordered  --   --   --   Length of  DialysisTreatment -hour(s)  --   --   --   Dialyzer  --   --   --   Dialysate  --   --   --   Dialysate Flow Ordered  --   --   --   Blood Flow Rate Ordered  --   --   --   Ultrafiltration Goal  --   --   --   During Hemodialysis Assessment  Blood Flow Rate (mL/min)  --   --   --   Arterial Pressure (mmHg)  --   --   --   Venous Pressure (mmHg)  --   --   --   Transmembrane Pressure (mmHg)  --   --   --   Ultrafiltration Rate  (mL/min)  --   --   --   Dialysate Flow Rate (mL/min)  --   --   --   Conductivity:  Machine   --   --   --   HD Safety Checks Performed  --   --   --   Dialysis Fluid Bolus  --   --   --   Bolus Amount (mL)  --   --   --   Dialysate Change  --   --   --   Intra-Hemodialysis Comments  --   --   --   Post-Hemodialysis Assessment  Rinseback Volume (mL)  --   --   --   Dialyzer Clearance  --   --   --   Duration of HD Treatment -hour(s)  --   --   --   Hemodialysis Intake (mL)  --   --   --   UF Total -Machine (mL)  --   --   --   Net UF (mL)  --   --   --   Tolerated HD Treatment  --   --   --   AVG/AVF Arterial Site Held (minutes)  --   --   --   AVG/AVF Venous Site Held (minutes)  --   --   --   Education / Care Plan  Dialysis Education Provided  --   --   --

## 2019-12-20 NOTE — Progress Notes (Signed)
12/17/19 1130 12/17/19 1145 12/17/19 1200  Vitals  BP (!) 91/46 (!) 95/49 (!) 92/53  MAP (mmHg) (!) 60 (!) 63 66  BP Location Right Arm Right Arm Right Arm  BP Method Automatic Automatic Automatic  Patient Position (if appropriate) Lying Lying Lying  Pulse Rate 77 62 70  Pulse Rate Source Dinamap Dinamap Dinamap  ECG Heart Rate 70 63 69  Resp 18 17 (!) 21  During Hemodialysis Assessment  Blood Flow Rate (mL/min) 400 mL/min 400 mL/min 400 mL/min  Arterial Pressure (mmHg) -150 mmHg -150 mmHg -150 mmHg  Venous Pressure (mmHg) 140 mmHg 140 mmHg 140 mmHg  Transmembrane Pressure (mmHg) 90 mmHg 90 mmHg 90 mmHg  Ultrafiltration Rate (mL/min) 1170 mL/min 1170 mL/min 1170 mL/min  Dialysate Flow Rate (mL/min) 600 ml/min 600 ml/min 600 ml/min  Conductivity: Machine  13.8 13.8 13.8  HD Safety Checks Performed Yes Yes Yes  Dialysis Fluid Bolus  --   --   --   Bolus Amount (mL)  --   --   --   Intra-Hemodialysis Comments Progressing as prescribed Progressing as prescribed Progressing as prescribed  Post-Hemodialysis Assessment  Rinseback Volume (mL)  --   --   --   Dialyzer Clearance  --   --   --   Duration of HD Treatment -hour(s)  --   --   --   Hemodialysis Intake (mL)  --   --   --   UF Total -Machine (mL)  --   --   --   Net UF (mL)  --   --   --   Tolerated HD Treatment  --   --   --   AVG/AVF Arterial Site Held (minutes)  --   --   --   AVG/AVF Venous Site Held (minutes)  --   --   --   Fistula / Graft Left Upper arm Arteriovenous fistula  No Placement Date or Time found.   Placed prior to admission: Yes  Orientation: Left  Access Location: Upper arm  Access Type: Arteriovenous fistula  Site Condition No complications No complications No complications  Status Accessed Accessed Accessed    12/17/19 1215 12/17/19 1230 12/17/19 1245  Vitals  BP (!) 91/52 (!) 93/54 (!) 92/48  MAP (mmHg) (!) 63 65 (!) 61  BP Location Right Arm Right Arm Right Arm  BP Method Automatic Automatic  Automatic  Patient Position (if appropriate) Lying Lying Lying  Pulse Rate 69 71 66  Pulse Rate Source Dinamap Dinamap Dinamap  ECG Heart Rate 67 69 65  Resp 20 15 17   During Hemodialysis Assessment  Blood Flow Rate (mL/min) 400 mL/min 400 mL/min 400 mL/min  Arterial Pressure (mmHg) -150 mmHg -150 mmHg -150 mmHg  Venous Pressure (mmHg) 140 mmHg 140 mmHg 140 mmHg  Transmembrane Pressure (mmHg) 80 mmHg 80 mmHg 80 mmHg  Ultrafiltration Rate (mL/min) 880 mL/min 880 mL/min 780 mL/min  Dialysate Flow Rate (mL/min) 600 ml/min 600 ml/min 600 ml/min  Conductivity: Machine  13.8 13.8 13.8  HD Safety Checks Performed Yes Yes Yes  Dialysis Fluid Bolus  --   --   --   Bolus Amount (mL)  --   --   --   Intra-Hemodialysis Comments Progressing as prescribed Progressing as prescribed Progressing as prescribed  Post-Hemodialysis Assessment  Rinseback Volume (mL)  --   --   --   Dialyzer Clearance  --   --   --   Duration of HD Treatment -hour(s)  --   --   --  Hemodialysis Intake (mL)  --   --   --   UF Total -Machine (mL)  --   --   --   Net UF (mL)  --   --   --   Tolerated HD Treatment  --   --   --   AVG/AVF Arterial Site Held (minutes)  --   --   --   AVG/AVF Venous Site Held (minutes)  --   --   --   Fistula / Graft Left Upper arm Arteriovenous fistula  No Placement Date or Time found.   Placed prior to admission: Yes  Orientation: Left  Access Location: Upper arm  Access Type: Arteriovenous fistula  Site Condition No complications No complications No complications  Status Accessed Accessed Accessed    12/17/19 1300 12/17/19 1315 12/17/19 1330  Vitals  BP (!) 91/55 (!) 99/53 (!) 115/46  MAP (mmHg) (!) 64 66 66  BP Location  --   --   --   BP Method  --   --   --   Patient Position (if appropriate)  --   --   --   Pulse Rate 65 75 67  Pulse Rate Source  --   --   --   ECG Heart Rate 65 67 65  Resp (!) 21 20 17   During Hemodialysis Assessment  Blood Flow Rate (mL/min) 400 mL/min 400  mL/min 200 mL/min  Arterial Pressure (mmHg) -150 mmHg -150 mmHg  --   Venous Pressure (mmHg) 140 mmHg 140 mmHg  --   Transmembrane Pressure (mmHg) 80 mmHg 80 mmHg  --   Ultrafiltration Rate (mL/min) 780 mL/min 780 mL/min  --   Dialysate Flow Rate (mL/min) 600 ml/min 600 ml/min  --   Conductivity: Machine  13.8 13.8  --   HD Safety Checks Performed Yes Yes Yes  Dialysis Fluid Bolus  --   --  Normal Saline  Bolus Amount (mL)  --   --  300 mL  Intra-Hemodialysis Comments Progressing as prescribed Progressing as prescribed Tx completed  Post-Hemodialysis Assessment  Rinseback Volume (mL)  --   --  300 mL  Dialyzer Clearance  --   --  Clear  Duration of HD Treatment -hour(s)  --   --  3 hour(s)  Hemodialysis Intake (mL)  --   --  500 mL  UF Total -Machine (mL)  --   --  3500 mL  Net UF (mL)  --   --  3000 mL  Tolerated HD Treatment  --   --  Yes  AVG/AVF Arterial Site Held (minutes)  --   --  5 minutes  AVG/AVF Venous Site Held (minutes)  --   --  5 minutes  Fistula / Graft Left Upper arm Arteriovenous fistula  No Placement Date or Time found.   Placed prior to admission: Yes  Orientation: Left  Access Location: Upper arm  Access Type: Arteriovenous fistula  Site Condition No complications No complications No complications  Status Accessed Accessed Deaccessed

## 2019-12-20 NOTE — Progress Notes (Signed)
Patient continues to request UF be left on at this time. Patient asymptomatic

## 2019-12-20 NOTE — Progress Notes (Signed)
12/17/19 1015 12/17/19 1025 12/17/19 1030  Vitals  BP (!) 92/53 (!) 95/59 (!) 97/54  MAP (mmHg) 65 68 65  BP Location Right Arm Right Arm Right Arm  BP Method Automatic Automatic Automatic  Patient Position (if appropriate) Lying Lying Lying  Pulse Rate 78 74 77  Pulse Rate Source Dinamap Dinamap Dinamap  ECG Heart Rate 74 76 71  Resp (!) 24 19 13   Time-Out for Hemodialysis  What Procedure? hemodialysis  --   --   Pt Identifiers(min of two) First/Last Name;Pt's DOB(use if MRN/Acct# not available  --   --   Correct Site? Yes  --   --   Correct Side? Yes  --   --   Correct Procedure? Yes  --   --   Consents Verified? Yes  --   --   Safety Precautions Reviewed? Yes  --   --   Engineer, civil (consulting) Number [phone]  --   --   Station Number [id number]  --   --   UF/Alarm Test Passed  --   --   Conductivity: Meter 14  --   --   Conductivity: Machine  13.8  --   --   pH 7.4  --   --   Normal Saline Lot Number [id number]  --   --   Dialyzer Lot Number [id number]  --   --   Disposable Set Lot Number [id number]  --   --   Dialysate Acid Bath Lot Number [id number]  --   --   Dialysate HCO3 Bath Lot Number [id number]  --   --   Machine Temperature 98.6 F (37 C)  --   --   Musician and Audible Yes  --   --   Blood Lines Intact and Secured Yes  --   --   Pre Treatment Patient Checks  Vascular access used during treatment Fistula  --   --   Hepatitis B Surface Antigen Results Negative  --   --   Date Hepatitis B Surface Antigen Drawn 11/19/19  --   --   Hepatitis B Surface Antibody 0  --   --   Date Hepatitis B Surface Antibody Drawn 11/19/19  --   --   Hemodialysis Consent Verified Yes  --   --   Hemodialysis Standing Orders Initiated Yes  --   --   ECG (Telemetry) Monitor On Yes  --   --   Prime Ordered Normal Saline  --   --   Length of  DialysisTreatment -hour(s) 3 Hour(s)  --   --   Dialyzer Elisio 17H NR  --   --   Dialysate 1K;2.5 Ca  --   --   Dialysate Flow Ordered 600  --   --   Blood  Flow Rate Ordered 400 mL/min  --   --   Ultrafiltration Goal 3 Liters  --   --   During Hemodialysis Assessment  Blood Flow Rate (mL/min)  --  400 mL/min 400 mL/min  Arterial Pressure (mmHg)  --  -150 mmHg -140 mmHg  Venous Pressure (mmHg)  --  130 mmHg 140 mmHg  Transmembrane Pressure (mmHg)  --  90 mmHg 90 mmHg  Ultrafiltration Rate (mL/min)  --  1170 mL/min 1170 mL/min  Dialysate Flow Rate (mL/min)  --  600 ml/min 600 ml/min  Conductivity: Machine   --  13.8 13.8  HD Safety Checks Performed  --  Yes Yes  Dialysis Fluid  Bolus  --  Normal Saline  --   Bolus Amount (mL)  --  200 mL  --   Dialysate Change  --  1K  --   Intra-Hemodialysis Comments  --  Tx initiated Progressing as prescribed  Fistula / Graft Left Upper arm Arteriovenous fistula  No Placement Date or Time found.   Placed prior to admission: Yes  Orientation: Left  Access Location: Upper arm  Access Type: Arteriovenous fistula  Site Condition  --  No complications No complications  Fistula / Graft Assessment  --  Present;Thrill;Bruit  --   Status  --  Accessed Accessed  Needle Size  --  15  --     12/17/19 1045 12/17/19 1100 12/17/19 1111  Vitals  BP (!) 98/49 (!) 92/52 (!) 95/49  MAP (mmHg) (!) 64 (!) 64  --   BP Location Right Arm Right Arm Right Arm  BP Method Automatic Automatic Automatic  Patient Position (if appropriate) Lying Lying Lying  Pulse Rate 72 76 68  Pulse Rate Source Dinamap Dinamap Dinamap  ECG Heart Rate 73 77 69  Resp (!) 21 16 20   Time-Out for Hemodialysis  What Procedure?  --   --   --   Pt Identifiers(min of two)  --   --   --   Correct Site?  --   --   --   Correct Side?  --   --   --   Correct Procedure?  --   --   --   Consents Verified?  --   --   --   Safety Precautions Reviewed?  --   --   --   Engineer, civil (consulting) Number  --   --   --   Station Number  --   --   --   UF/Alarm Test  --   --   --   Conductivity: Meter  --   --   --   Conductivity: Machine   --   --   --   pH   --   --   --   Normal Saline Lot Number  --   --   --   Dialyzer Lot Number  --   --   --   Disposable Set Lot Number  --   --   --   Dialysate Acid Bath Lot Number  --   --   --   Dialysate HCO3 Bath Lot Number  --   --   --   Machine Temperature  --   --   --   Musician and Audible  --   --   --   Blood Lines Intact and Secured  --   --   --   Pre Treatment Patient Checks  Vascular access used during treatment  --   --   --   Hepatitis B Surface Antigen Results  --   --   --   Date Hepatitis B Surface Antigen Drawn  --   --   --   Hepatitis B Surface Antibody  --   --   --   Date Hepatitis B Surface Antibody Drawn  --   --   --   Hemodialysis Consent Verified  --   --   --   Hemodialysis Standing Orders Initiated  --   --   --   ECG (Telemetry) Monitor On  --   --   --   Prime Ordered  --   --   --  Length of  DialysisTreatment -hour(s)  --   --   --   Dialyzer  --   --   --   Dialysate  --   --   --   Dialysate Flow Ordered  --   --   --   Blood Flow Rate Ordered  --   --   --   Ultrafiltration Goal  --   --   --   During Hemodialysis Assessment  Blood Flow Rate (mL/min) 400 mL/min 400 mL/min  --   Arterial Pressure (mmHg) -150 mmHg -150 mmHg  --   Venous Pressure (mmHg) 140 mmHg 140 mmHg  --   Transmembrane Pressure (mmHg) 90 mmHg 90 mmHg  --   Ultrafiltration Rate (mL/min) 1170 mL/min 1170 mL/min  --   Dialysate Flow Rate (mL/min) 600 ml/min 600 ml/min  --   Conductivity: Machine  13.8 13.8  --   HD Safety Checks Performed Yes Yes  --   Dialysis Fluid Bolus  --   --   --   Bolus Amount (mL)  --   --   --   Dialysate Change  --   --   --   Intra-Hemodialysis Comments Progressing as prescribed Progressing as prescribed  --   Fistula / Graft Left Upper arm Arteriovenous fistula  No Placement Date or Time found.   Placed prior to admission: Yes  Orientation: Left  Access Location: Upper arm  Access Type: Arteriovenous fistula  Site Condition No complications No  complications  --   Fistula / Graft Assessment  --   --   --   Status Accessed Accessed  --   Needle Size  --   --   --     12/17/19 1115 12/17/19 1125 12/17/19 1130  Vitals  BP (!) 92/51 (!) 92/49 (!) 91/46  MAP (mmHg) (!) 63  --  (!) 60  BP Location Right Arm Right Arm Right Arm  BP Method Automatic Automatic Automatic  Patient Position (if appropriate) Lying Lying Lying  Pulse Rate 73 71 77  Pulse Rate Source Dinamap Dinamap Dinamap  ECG Heart Rate 66 73 70  Resp (!) 24 19 18   Time-Out for Hemodialysis  What Procedure?  --   --   --   Pt Identifiers(min of two)  --   --   --   Correct Site?  --   --   --   Correct Side?  --   --   --   Correct Procedure?  --   --   --   Consents Verified?  --   --   --   Safety Precautions Reviewed?  --   --   --   Engineer, civil (consulting) Number  --   --   --   Station Number  --   --   --   UF/Alarm Test  --   --   --   Conductivity: Meter  --   --   --   Conductivity: Machine   --   --   --   pH  --   --   --   Normal Saline Lot Number  --   --   --   Dialyzer Lot Number  --   --   --   Disposable Set Lot Number  --   --   --   Dialysate Acid Bath Lot Number  --   --   --   Dialysate HCO3 Bath  Lot Number  --   --   --   Machine Temperature  --   --   --   Musician and Audible  --   --   --   Blood Lines Intact and Secured  --   --   --   Pre Treatment Patient Checks  Vascular access used during treatment  --   --   --   Hepatitis B Surface Antigen Results  --   --   --   Date Hepatitis B Surface Antigen Drawn  --   --   --   Hepatitis B Surface Antibody  --   --   --   Date Hepatitis B Surface Antibody Drawn  --   --   --   Hemodialysis Consent Verified  --   --   --   Hemodialysis Standing Orders Initiated  --   --   --   ECG (Telemetry) Monitor On  --   --   --   Prime Ordered  --   --   --   Length of  DialysisTreatment -hour(s)  --   --   --   Dialyzer  --   --   --   Dialysate  --   --   --   Dialysate Flow  Ordered  --   --   --   Blood Flow Rate Ordered  --   --   --   Ultrafiltration Goal  --   --   --   During Hemodialysis Assessment  Blood Flow Rate (mL/min) 400 mL/min 400 mL/min 400 mL/min  Arterial Pressure (mmHg) -150 mmHg -150 mmHg -150 mmHg  Venous Pressure (mmHg) 140 mmHg 140 mmHg 140 mmHg  Transmembrane Pressure (mmHg) 90 mmHg 90 mmHg 90 mmHg  Ultrafiltration Rate (mL/min) 1170 mL/min 1170 mL/min 1170 mL/min  Dialysate Flow Rate (mL/min) 600 ml/min 600 ml/min 600 ml/min  Conductivity: Machine  13.8 13.8 13.8  HD Safety Checks Performed Yes Yes Yes  Dialysis Fluid Bolus  --   --   --   Bolus Amount (mL)  --   --   --   Dialysate Change  --  2K  --   Intra-Hemodialysis Comments Progressing as prescribed Progressing as prescribed Progressing as prescribed  Fistula / Graft Left Upper arm Arteriovenous fistula  No Placement Date or Time found.   Placed prior to admission: Yes  Orientation: Left  Access Location: Upper arm  Access Type: Arteriovenous fistula  Site Condition No complications No complications No complications  Fistula / Graft Assessment  --   --   --   Status Accessed Accessed Accessed  Needle Size  --   --   --

## 2019-12-20 NOTE — Progress Notes (Signed)
[facility] requested flowsheets of past three dialysis treatments before accepting patient. Flowsheets sent, waiting on clinic acceptance.

## 2019-12-20 NOTE — Progress Notes (Signed)
[facility]  ROUNDING NOTE   Subjective:    Patient resting in bed, planning dialysis treatment today. Discharge planning in process. Dialysis coordinator working with outpatient dialysis center for proper placement.   Objective:  Vital signs in last 24 hours:  Temp:  [97.5 F (36.4 C)-98.1 F (36.7 C)] 98 F (36.7 C) (11/30 1050) Pulse Rate:  [44-83] 44 (11/30 1300) Resp:  [13-23] 14 (11/30 1315) BP: (94-116)/(48-80) 107/55 (11/30 1315) SpO2:  [98 %-100 %] 100 % (11/30 1315)  Weight change:  Filed Weights   12/05/19 1757  Weight: 95.3 kg    Intake/Output: I/O last 3 completed shifts: In: 640 [P.O.:640] Out: 0    Intake/Output this shift:  No intake/output data recorded.  Physical Exam: General:  Appears calm and comfortable  Head:  Normocephalic, atraumatic  Eyes:  Anicteric  Lungs:   Lungs clear to auscultation bilaterally, respirations even and unlabored  Heart: Irregular, HR in 60s  Abdomen:  Soft, non tender,non distended  Extremities:  Lower extremity edema 1+  Neurologic:  oriented  Skin: Rt leg with dressing clean,dry and intact  Access: Left AVG +bruit,+thrill    Basic Metabolic Panel: Recent Labs  Lab 12/15/19 1000 12/17/19 1116  NA 138 137  K 5.6* 4.2  CL 96* 96*  CO2 29 31  GLUCOSE 108* 112*  BUN 43* 28*  CREATININE 5.67* 4.21*  CALCIUM 9.3 9.1  MG 2.0  --   PHOS 4.9* 3.4    Liver Function Tests: Recent Labs  Lab 12/15/19 1000 12/17/19 1116  ALBUMIN 2.9* 2.9*   No results for input(s): LIPASE, AMYLASE in the last 168 hours. No results for input(s): AMMONIA in the last 168 hours.  CBC: Recent Labs  Lab 12/15/19 1000 12/17/19 1116 12/17/19 1908  WBC 5.5 5.4  --   NEUTROABS 3.9 3.9  --   HGB 7.5* 7.2* 8.2*  HCT 24.3* 23.4* 26.6*  MCV 96.0 95.9  --   PLT 130* 149*  --     Cardiac Enzymes: No results for input(s): CKTOTAL, CKMB, CKMBINDEX, TROPONINI in the last 168 hours.  BNP: Invalid input(s):  POCBNP  CBG: No results for input(s): GLUCAP in the last 168 hours.  Microbiology: Results for orders placed or performed during the hospital encounter of 12/05/19  Respiratory Panel by RT PCR (Flu A&B, Covid) - Nasopharyngeal Swab     Status: None   Collection Time: 12/05/19 10:04 PM   Specimen: Nasopharyngeal Swab  Result Value Ref Range Status   SARS Coronavirus 2 by RT PCR NEGATIVE NEGATIVE Final    Comment: (NOTE) SARS-CoV-2 target nucleic acids are NOT DETECTED.  The SARS-CoV-2 RNA is generally detectable in upper respiratoy specimens during the acute phase of infection. The lowest concentration of SARS-CoV-2 viral copies this assay can detect is 131 copies/mL. A negative result does not preclude SARS-Cov-2 infection and should not be used as the sole basis for treatment or other patient management decisions. A negative result may occur with  improper specimen collection/handling, submission of specimen other than nasopharyngeal swab, presence of viral mutation(s) within the areas targeted by this assay, and inadequate number of viral copies (<131 copies/mL). A negative result must be combined with clinical observations, patient history, and epidemiological information. The expected result is Negative.  Fact Sheet for Patients:  PinkCheek.be  Fact Sheet for Healthcare Providers:  GravelBags.it  This test is no t yet approved or cleared by the [place] FDA and  has been authorized for detection and/or diagnosis  of SARS-CoV-2 by FDA under an Emergency Use Authorization (EUA). This EUA will remain  in effect (meaning this test can be used) for the duration of the COVID-19 declaration under Section 564(b)(1) of the Act, 21 U.S.C. section 360bbb-3(b)(1), unless the authorization is terminated or revoked sooner.     Influenza A by PCR NEGATIVE NEGATIVE Final   Influenza B by PCR NEGATIVE NEGATIVE Final     Comment: (NOTE) The Xpert Xpress SARS-CoV-2/FLU/RSV assay is intended as an aid in  the diagnosis of influenza from Nasopharyngeal swab specimens and  should not be used as a sole basis for treatment. Nasal washings and  aspirates are unacceptable for Xpert Xpress SARS-CoV-2/FLU/RSV  testing.  Fact Sheet for Patients: PinkCheek.be  Fact Sheet for Healthcare Providers: GravelBags.it  This test is not yet approved or cleared by the [place] FDA and  has been authorized for detection and/or diagnosis of SARS-CoV-2 by  FDA under an Emergency Use Authorization (EUA). This EUA will remain  in effect (meaning this test can be used) for the duration of the  Covid-19 declaration under Section 564(b)(1) of the Act, 21  U.S.C. section 360bbb-3(b)(1), unless the authorization is  terminated or revoked. Performed at [facility], [address] [place], [place] [address]   Resp Panel by RT-PCR (Flu A&B, Covid) Nasopharyngeal Swab     Status: None   Collection Time: 12/16/19 12:03 PM   Specimen: Nasopharyngeal Swab; Nasopharyngeal(NP) swabs in vial transport medium  Result Value Ref Range Status   SARS Coronavirus 2 by RT PCR NEGATIVE NEGATIVE Final    Comment: (NOTE) SARS-CoV-2 target nucleic acids are NOT DETECTED.  The SARS-CoV-2 RNA is generally detectable in upper respiratory specimens during the acute phase of infection. The lowest concentration of SARS-CoV-2 viral copies this assay can detect is 138 copies/mL. A negative result does not preclude SARS-Cov-2 infection and should not be used as the sole basis for treatment or other patient management decisions. A negative result may occur with  improper specimen collection/handling, submission of specimen other than nasopharyngeal swab, presence of viral mutation(s) within the areas targeted by this assay, and inadequate number of viral copies(<138 copies/mL). A  negative result must be combined with clinical observations, patient history, and epidemiological information. The expected result is Negative.  Fact Sheet for Patients:  EntrepreneurPulse.com.au  Fact Sheet for Healthcare Providers:  IncredibleEmployment.be  This test is no t yet approved or cleared by the [place] FDA and  has been authorized for detection and/or diagnosis of SARS-CoV-2 by FDA under an Emergency Use Authorization (EUA). This EUA will remain  in effect (meaning this test can be used) for the duration of the COVID-19 declaration under Section 564(b)(1) of the Act, 21 U.S.C.section 360bbb-3(b)(1), unless the authorization is terminated  or revoked sooner.       Influenza A by PCR NEGATIVE NEGATIVE Final   Influenza B by PCR NEGATIVE NEGATIVE Final    Comment: (NOTE) The Xpert Xpress SARS-CoV-2/FLU/RSV plus assay is intended as an aid in the diagnosis of influenza from Nasopharyngeal swab specimens and should not be used as a sole basis for treatment. Nasal washings and aspirates are unacceptable for Xpert Xpress SARS-CoV-2/FLU/RSV testing.  Fact Sheet for Patients: EntrepreneurPulse.com.au  Fact Sheet for Healthcare Providers: IncredibleEmployment.be  This test is not yet approved or cleared by the [place] FDA and has been authorized for detection and/or diagnosis of SARS-CoV-2 by FDA under an Emergency Use Authorization (EUA). This EUA will remain in effect (meaning  this test can be used) for the duration of the COVID-19 declaration under Section 564(b)(1) of the Act, 21 U.S.C. section 360bbb-3(b)(1), unless the authorization is terminated or revoked.  Performed at [facility], [address] [place], [place] [address]     Coagulation Studies: No results for input(s): LABPROT, INR in the last 72 hours.  Urinalysis: No results for input(s): COLORURINE,  LABSPEC, PHURINE, GLUCOSEU, HGBUR, BILIRUBINUR, KETONESUR, PROTEINUR, UROBILINOGEN, NITRITE, LEUKOCYTESUR in the last 72 hours.  Invalid input(s): APPERANCEUR    Imaging: No results found.   Medications:   . albumin human 25 g (12/20/19 1123)   . aspirin EC  81 mg Oral Daily  . cephALEXin  500 mg Oral Q12H  . collagenase   Topical Daily  . epoetin (EPOGEN/PROCRIT) injection  10,000 Units Intravenous Q T,Th,Sa-HD  . feeding supplement (NEPRO CARB STEADY)  237 mL Oral BID BM  . ferrous sulfate  325 mg Oral BID  . heparin  5,000 Units Subcutaneous Q8H  . midodrine  10 mg Oral TID WC  . multivitamin  1 tablet Oral QHS  . pantoprazole  40 mg Oral BID  . predniSONE  10 mg Oral Daily  . sevelamer carbonate  800 mg Oral BID WC  . sodium bicarbonate  1,300 mg Oral BID  . tamsulosin  0.4 mg Oral Once per day on Sun Mon Wed Fri  . traZODone  50 mg Oral QHS   acetaminophen **OR** acetaminophen, calcium carbonate, loperamide, ondansetron **OR** ondansetron (ZOFRAN) IV, oxyCODONE, promethazine  Assessment/ Plan:  [name] is a 49 y.o. black male with end stage renal disease on hemodialysis, history of kidney transplant, lupus nephritis, and hypotension who was admitted to [facility] on 12/05/2019 for Hyperkalemia [E87.5] Cellulitis of right lower extremity [L03.115]  Left arm AVG/TTS  # End stage renal disease.  Patient will receive dialysis treatment today as per his regular schedule Discharge dialysis arrangements in process We will continue TTS schedule for dialysis  #Anemia with chronic kidney disease.   Lab Results  Component Value Date   HGB 8.2 (L) 12/17/2019  Patient is on oral iron supplements Receiving Epogen with dialysis TTS He required blood transfusion during this admission  #Secondary Hyperparathyroidism Lab Results  Component Value Date   CALCIUM 9.1 12/17/2019   PHOS 3.4 12/17/2019  We will continue monitoring bone mineral metabolism  parameters  #Hyperkalemia We will recheck labs again today in dialysis   LOS: 14 Tauno Falotico 11/30/20211:54 PM

## 2019-12-20 NOTE — Progress Notes (Signed)
Patient requesting UF be left on at this time. Asymptomatic to decreased blood pressure. Albumin administered the first hour of treatment.

## 2019-12-20 NOTE — Progress Notes (Signed)
HD treatment started and completed on patient today with no issues or concerns. V/S taken and recorded and are noted to slightly decreased throughout treatment. Patient refuse having UF off with decreased blood pressure stating, " I can tell when I need the UF off and I will let you know". RN stated understanding of patients request. 1k 2.5Ca started the 1st hour of treatment and switched back to 2.0K 2.5Ca the second hour of treatment. Albumin administered during the 1st hour of treatment per Dr. Candiss Norse orders. Epogen unable to be given during dialysis due to stock being out. RN did text pharmacy with request to send dose but dose came after patient discharge from HD and headed back to his room. Patient requested treatment be discontinued with 15 minutes remaining of treatment.Total UF 3400 ml x 3 hrs and 49min. Patient left the unit with no c/o any or no s/s of any distress.

## 2019-12-21 ENCOUNTER — Institutional Professional Consult (permissible substitution): Payer: No Typology Code available for payment source | Admitting: Cardiology

## 2019-12-21 LAB — HEPATITIS B SURFACE ANTIBODY, QUANTITATIVE: Hep B S AB Quant (Post): >1000 m[IU]/mL (ref >9.9)

## 2019-12-21 NOTE — Progress Notes (Signed)
The accepting outpatient dialysis MD, Dr. Jalene Mullet, requested a peer to peer conversation with Dr. Candiss Norse this morining. Dr. Jalene Mullet requested that the patient dialyze in a chair or sit up in a chair for a few hours and wanted a standing weight. With the help of [facility] staff we were able to get the patient's standing weight and a note that stated patient can walk and sit in a chair and needs no help with ADLs. However, after another peer to peer conversation, it was decided that the patient does not appear medically stable to dialyze in an outpatient setting. I have spoken with Ezekiel Ina, the admissions specialist, who stated that they cannot get VA authorization for outpatient dialysis until MD accepts patient. Also, since it is Wednesday and it does not appear that patient will be able to start tomorrow at center, patient will not be able to start until next Tuesday 12/7, as they will not start patient on a Saturday. Please contact me with any questions.   [name] Dialysis Coordinator [phone]

## 2019-12-21 NOTE — Progress Notes (Addendum)
[facility]  ROUNDING NOTE   Subjective:    Patient received dialysis treatment yesterday, tolerated well.  Discharge planning in process.    Objective:  Vital signs in last 24 hours:  Temp:  [97.5 F (36.4 C)-98.6 F (37 C)] 98.6 F (37 C) (12/01 1107) Pulse Rate:  [73-79] 79 (12/01 1107) Resp:  [16-18] 18 (12/01 1107) BP: (105-138)/(62-70) 105/69 (12/01 1107) SpO2:  [95 %-100 %] 95 % (12/01 1107) Weight:  [95.5 kg] 95.5 kg (12/01 1107)  Weight change:  Filed Weights   12/05/19 1757 12/21/19 1107  Weight: 95.3 kg 95.5 kg    Intake/Output: I/O last 3 completed shifts: In: 0  Out: 2900 [Other:2900]   Intake/Output this shift:  Total I/O In: 480 [P.O.:480] Out: -   Physical Exam: General:  Resting in bed, in no acute distress  Head:  Normocephalic, atraumatic  Eyes:  Sclera and conjunctiva clear  Lungs:   Respirations symmetrical, unlabored, lungs clear  Heart:  S1S2, no rubs or gallops  Abdomen:  Soft, non tender,non distended  Extremities:  Lower extremity edema 1+  Neurologic:  Awake, alert, oriented  Skin: Rt leg with dressing clean,dry and intact  Access: Left AVG +bruit,+thrill    Basic Metabolic Panel: Recent Labs  Lab 12/15/19 1000 12/17/19 1116  NA 138 137  K 5.6* 4.2  CL 96* 96*  CO2 29 31  GLUCOSE 108* 112*  BUN 43* 28*  CREATININE 5.67* 4.21*  CALCIUM 9.3 9.1  MG 2.0  --   PHOS 4.9* 3.4    Liver Function Tests: Recent Labs  Lab 12/15/19 1000 12/17/19 1116  ALBUMIN 2.9* 2.9*   No results for input(s): LIPASE, AMYLASE in the last 168 hours. No results for input(s): AMMONIA in the last 168 hours.  CBC: Recent Labs  Lab 12/15/19 1000 12/17/19 1116 12/17/19 1908  WBC 5.5 5.4  --   NEUTROABS 3.9 3.9  --   HGB 7.5* 7.2* 8.2*  HCT 24.3* 23.4* 26.6*  MCV 96.0 95.9  --   PLT 130* 149*  --     Cardiac Enzymes: No results for input(s): CKTOTAL, CKMB, CKMBINDEX, TROPONINI in the last 168 hours.  BNP: Invalid  input(s): POCBNP  CBG: No results for input(s): GLUCAP in the last 168 hours.  Microbiology: Results for orders placed or performed during the hospital encounter of 12/05/19  Respiratory Panel by RT PCR (Flu A&B, Covid) - Nasopharyngeal Swab     Status: None   Collection Time: 12/05/19 10:04 PM   Specimen: Nasopharyngeal Swab  Result Value Ref Range Status   SARS Coronavirus 2 by RT PCR NEGATIVE NEGATIVE Final    Comment: (NOTE) SARS-CoV-2 target nucleic acids are NOT DETECTED.  The SARS-CoV-2 RNA is generally detectable in upper respiratoy specimens during the acute phase of infection. The lowest concentration of SARS-CoV-2 viral copies this assay can detect is 131 copies/mL. A negative result does not preclude SARS-Cov-2 infection and should not be used as the sole basis for treatment or other patient management decisions. A negative result may occur with  improper specimen collection/handling, submission of specimen other than nasopharyngeal swab, presence of viral mutation(s) within the areas targeted by this assay, and inadequate number of viral copies (<131 copies/mL). A negative result must be combined with clinical observations, patient history, and epidemiological information. The expected result is Negative.  Fact Sheet for Patients:  PinkCheek.be  Fact Sheet for Healthcare Providers:  GravelBags.it  This test is no t yet approved or cleared by  the [employer] and  has been authorized for detection and/or diagnosis of SARS-CoV-2 by FDA under an Emergency Use Authorization (EUA). This EUA will remain  in effect (meaning this test can be used) for the duration of the COVID-19 declaration under Section 564(b)(1) of the Act, 21 U.S.C. section 360bbb-3(b)(1), unless the authorization is terminated or revoked sooner.     Influenza A by PCR NEGATIVE NEGATIVE Final   Influenza B by PCR NEGATIVE NEGATIVE Final     Comment: (NOTE) The Xpert Xpress SARS-CoV-2/FLU/RSV assay is intended as an aid in  the diagnosis of influenza from Nasopharyngeal swab specimens and  should not be used as a sole basis for treatment. Nasal washings and  aspirates are unacceptable for Xpert Xpress SARS-CoV-2/FLU/RSV  testing.  Fact Sheet for Patients: PinkCheek.be  Fact Sheet for Healthcare Providers: GravelBags.it  This test is not yet approved or cleared by the [place] FDA and  has been authorized for detection and/or diagnosis of SARS-CoV-2 by  FDA under an Emergency Use Authorization (EUA). This EUA will remain  in effect (meaning this test can be used) for the duration of the  Covid-19 declaration under Section 564(b)(1) of the Act, 21  U.S.C. section 360bbb-3(b)(1), unless the authorization is  terminated or revoked. Performed at [facility], [address] [place], [place] [address]   Resp Panel by RT-PCR (Flu A&B, Covid) Nasopharyngeal Swab     Status: None   Collection Time: 12/16/19 12:03 PM   Specimen: Nasopharyngeal Swab; Nasopharyngeal(NP) swabs in vial transport medium  Result Value Ref Range Status   SARS Coronavirus 2 by RT PCR NEGATIVE NEGATIVE Final    Comment: (NOTE) SARS-CoV-2 target nucleic acids are NOT DETECTED.  The SARS-CoV-2 RNA is generally detectable in upper respiratory specimens during the acute phase of infection. The lowest concentration of SARS-CoV-2 viral copies this assay can detect is 138 copies/mL. A negative result does not preclude SARS-Cov-2 infection and should not be used as the sole basis for treatment or other patient management decisions. A negative result may occur with  improper specimen collection/handling, submission of specimen other than nasopharyngeal swab, presence of viral mutation(s) within the areas targeted by this assay, and inadequate number of viral copies(<138 copies/mL).  A negative result must be combined with clinical observations, patient history, and epidemiological information. The expected result is Negative.  Fact Sheet for Patients:  EntrepreneurPulse.com.au  Fact Sheet for Healthcare Providers:  IncredibleEmployment.be  This test is no t yet approved or cleared by the [place] FDA and  has been authorized for detection and/or diagnosis of SARS-CoV-2 by FDA under an Emergency Use Authorization (EUA). This EUA will remain  in effect (meaning this test can be used) for the duration of the COVID-19 declaration under Section 564(b)(1) of the Act, 21 U.S.C.section 360bbb-3(b)(1), unless the authorization is terminated  or revoked sooner.       Influenza A by PCR NEGATIVE NEGATIVE Final   Influenza B by PCR NEGATIVE NEGATIVE Final    Comment: (NOTE) The Xpert Xpress SARS-CoV-2/FLU/RSV plus assay is intended as an aid in the diagnosis of influenza from Nasopharyngeal swab specimens and should not be used as a sole basis for treatment. Nasal washings and aspirates are unacceptable for Xpert Xpress SARS-CoV-2/FLU/RSV testing.  Fact Sheet for Patients: EntrepreneurPulse.com.au  Fact Sheet for Healthcare Providers: IncredibleEmployment.be  This test is not yet approved or cleared by the [place] FDA and has been authorized for detection and/or diagnosis of SARS-CoV-2 by FDA  under an Emergency Use Authorization (EUA). This EUA will remain in effect (meaning this test can be used) for the duration of the COVID-19 declaration under Section 564(b)(1) of the Act, 21 U.S.C. section 360bbb-3(b)(1), unless the authorization is terminated or revoked.  Performed at [facility], [address] [place], [place] [address]     Coagulation Studies: No results for input(s): LABPROT, INR in the last 72 hours.  Urinalysis: No results for input(s): COLORURINE,  LABSPEC, PHURINE, GLUCOSEU, HGBUR, BILIRUBINUR, KETONESUR, PROTEINUR, UROBILINOGEN, NITRITE, LEUKOCYTESUR in the last 72 hours.  Invalid input(s): APPERANCEUR    Imaging: No results found.   Medications:   . albumin human 25 g (12/20/19 1123)   . aspirin EC  81 mg Oral Daily  . cephALEXin  500 mg Oral Q12H  . collagenase   Topical Daily  . epoetin (EPOGEN/PROCRIT) injection  10,000 Units Intravenous Q T,Th,Sa-HD  . feeding supplement (NEPRO CARB STEADY)  237 mL Oral BID BM  . ferrous sulfate  325 mg Oral BID  . heparin  5,000 Units Subcutaneous Q8H  . midodrine  10 mg Oral TID WC  . multivitamin  1 tablet Oral QHS  . pantoprazole  40 mg Oral BID  . predniSONE  10 mg Oral Daily  . sevelamer carbonate  800 mg Oral BID WC  . sodium bicarbonate  1,300 mg Oral BID  . tamsulosin  0.4 mg Oral Once per day on Sun Mon Wed Fri  . traZODone  50 mg Oral QHS   acetaminophen **OR** acetaminophen, calcium carbonate, loperamide, ondansetron **OR** ondansetron (ZOFRAN) IV, oxyCODONE, promethazine  Assessment/ Plan:  [name] is a 49 y.o. black male with end stage renal disease on hemodialysis, history of kidney transplant, lupus nephritis, and hypotension who was admitted to [facility] on 12/05/2019 for Hyperkalemia [E87.5] Cellulitis of right lower extremity [L03.115]  Left arm AVG/TTS  # End stage renal disease.  Patient received dialysis treatment yesterday Volume status acceptable No additional treatment required today We will plan for TTS schedule while hospitalized Plan for HD in dialysis chair. Patient refuses to sit in chair in the room No heparin during HD - patient claims "he does not need it. It made him sick in the past" use saline flushes Standing weight 95 kg  # Chronic hypotension Refusing to take midodrine States it makes his BP go lower Requesting iv albumin with each treatment Discussed with patient that iv albumin is not available as outpatient  # LE  Edema Still has residual edema  Fluid removal with HD as tolerated  #Anemia with chronic kidney disease.   Lab Results  Component Value Date   HGB 8.2 (L) 12/17/2019  Continue Epogen with dialysis  #Secondary Hyperparathyroidism Lab Results  Component Value Date   CALCIUM 9.1 12/17/2019   PHOS 3.4 12/17/2019  Patient is on sevelamer     LOS: 15 Princy Raju 12/1/20212:49 PM  I saw and evaluated the patient and discussed the care with Crosby Oyster, DNP.  I agree with the findings and plan as documented in the note.    Murlean Iba , MD [facility] 12/1/20218:54 PM

## 2019-12-21 NOTE — Progress Notes (Signed)
Physical Therapy Treatment Patient Details Name: [name] MRN: [id number] DOB: 01-Apr-1970 Today's Date: 12/21/2019    History of Present Illness Recently here and left AMA to try and get dialysis off site, unable and returns within a few hours. 2 weeks ago came in secondary to R LE swelling, pain; admitted for management of severe cellulitis of R LE.  Hospital course additionally significant for hypotension, requiring transfer to CCU and pressor support; episode of vtach with synchronized cardioversion (10/30), CRRT (via temp dialysis cath, left) 10/30-11/2.     PT Comments    Pt was long sitting in bed upon arriving. He required encouragement but was willing and cooperative once motivated. He did not require assistance with exiting bed with HOB elevated and heavy use of bed rails. He was able to stand from very elevated bed height with CGA. Unable to stand from standard height chair/surface. He then ambulated 200 ft with RW without LOB however does fatigue quickly. Several standing rest. Once returned to room, required prolonged sitting rest prior to returning to long sitting in bed via min -mod assist for LE progression. Overall pt tolerated session well and is progressing. He will benefit from CIR at DC to assist pt to PLOF. Acute PT will continue to follow per POC.     Follow Up Recommendations  CIR     Equipment Recommendations  Rolling walker with 5" wheels    Recommendations for Other Services       Precautions / Restrictions Precautions Precautions: Fall Precaution Comments: left AVF, distal RLE wound, unstageable pressure ulcer on R heel Restrictions Weight Bearing Restrictions: No    Mobility  Bed Mobility Overal bed mobility: Needs Assistance Bed Mobility: Supine to Sit;Sit to Supine (HOB elevated per pt request)     Supine to sit: HOB elevated;Supervision Sit to supine: Min assist;Mod assist;HOB elevated   General bed mobility comments: Pt requires assistance  to return to bed after OOB activity  Transfers Overall transfer level: Needs assistance Equipment used: Rolling walker (2 wheeled) Transfers: Sit to/from Stand Sit to Stand: From elevated surface;Min guard         General transfer comment: Pt is able to stand from elevated surfaces with CGA however is unable to stand from regular chair height surfaces. Pt has BLE weakness that is improving but continues have have deficits  Ambulation/Gait Ambulation/Gait assistance: Min guard Gait Distance (Feet): 200 Feet Assistive device: Rolling walker (2 wheeled)   Gait velocity: decreased   General Gait Details: pt was able to ambulate 200 ft with RW with close SBA/CGA. He does endorse fatigue however once sitting recovers quickly     Balance Overall balance assessment: Needs assistance Sitting-balance support: No upper extremity supported;Feet supported Sitting balance-Leahy Scale: Good     Standing balance support: Bilateral upper extremity supported Standing balance-Leahy Scale: Fair Standing balance comment: reliant on UE support for standing balance      Cognition Arousal/Alertness: Awake/alert Behavior During Therapy: WFL for tasks assessed/performed Overall Cognitive Status: Within Functional Limits for tasks assessed      General Comments: Pt was alert and cooperative with encouragement.              Pertinent Vitals/Pain Pain Assessment: No/denies pain Pain Score: 0-No pain           PT Goals (current goals can now be found in the care plan section) Acute Rehab PT Goals Patient Stated Goal: to go to rehab  Progress towards PT goals: Progressing toward goals  Frequency    Min 2X/week      PT Plan Current plan remains appropriate    Co-evaluation     PT goals addressed during session: Mobility/safety with mobility;Strengthening/ROM;Proper use of DME;Balance        AM-PAC PT "6 Clicks" Mobility   Outcome Measure  Help needed turning from your  back to your side while in a flat bed without using bedrails?: A Little Help needed moving from lying on your back to sitting on the side of a flat bed without using bedrails?: A Lot Help needed moving to and from a bed to a chair (including a wheelchair)?: A Little Help needed standing up from a chair using your arms (e.g., wheelchair or bedside chair)?: A Little Help needed to walk in hospital room?: A Little Help needed climbing 3-5 steps with a railing? : Total 6 Click Score: 15    End of Session         PT Visit Diagnosis: Muscle weakness (generalized) (M62.81);Difficulty in walking, not elsewhere classified (R26.2) Pain - Right/Left: Right Pain - part of body: Leg     Time: 1550-1606 PT Time Calculation (min) (ACUTE ONLY): 16 min  Charges:  $Gait Training: 8-22 mins                     Julaine Fusi PTA 12/21/19, 4:21 PM

## 2019-12-21 NOTE — Progress Notes (Signed)
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 His case was communicated with ID andhecompleted antibiotics on 12/09/2019.Improved and stable.  Medically stable for DC.  Repeat Covid test 11/26: Negative.  Inpatient rehab refused to take patient on 11/27.  TOC team working him up again for potential DC to inpatient rehab including insurance preauthorization.  Prior to admission, patient lived alone, and stated that he was independent and did not require any assistance to ambulate.  Currently deconditioned from multiple acute on chronic medical conditions.  He is slowly recovering from this.  He is very motivated to proceed with intense rehab so that he can return to his prior level of independent functioning and eventually home.  Thereby inpatient rehab and not SNF is the most appropriate discharge disposition for this patient at this time.  He is medically stable for discharge at this time.     Assessment & Plan:   Principal Problem:   Hyperkalemia Active Problems:   Cellulitis of right lower extremity   ESRD on hemodialysis (HCC)   Systemic lupus erythematosus, unspecified ([place])   History of Renal transplant failure   Pseudomonas aeruginosa wound infection  #1.  End-stage renal disease on dialysis was a failed renal transplant. Hyperkalemia. SLE. Patient condition has been stable.   Potassium has normalized. Spoke with Dr. Candiss Norse, patient be dialyzed again tomorrow.  #2.  Pseudomonas wound infection. Cellulitis of right lower extremity. Right lower extremity wound. Patient has completed a course of meropenem.  Last dose was 11/19.  Condition had improved.  Wound is stable.  3.  Anemia of chronic kidney disease. Hemoglobin has been stable.  Monitor, transfuse as needed.  4.  Thrombocytopenia. Stable.  #5.  Soft tissue infection of buttock. Patient has a boil noted on 11/28, the boys seem to be ruptured.  Started on Keflex for 7 days.  DVT prophylaxis: Heparin Code Status: Full Family Communication: None Disposition Plan:  .   Status is: Inpatient  Remains inpatient appropriate because:Unsafe d/c plan   Dispo: The patient is from: Home              Anticipated d/c is to: Inpatient rehab              Anticipated d/c date is: 1 day              Patient currently is medically stable to d/c.        I/O last 3 completed shifts: In: 0  Out: 2900 [Other:2900] No intake/output data recorded.     Consultants:   Nephrology  Procedures: HD  Antimicrobials: Keflex  Subjective: Patient feels well today.  Denies any short of breath or cough. No fever or chills. No abdominal pain nausea vomiting.  No diarrhea constipation.  Objective: Vitals:   12/20/19 1520 12/20/19 2316 12/21/19 0834 12/21/19 1107  BP: 109/70 105/62 138/67 105/69  Pulse: 75 75 73 79  Resp: 16  16 18  Temp: (!) 97.5 F (36.4 C) 97.7 F (36.5 C) 97.8 F (36.6 C) 98.6 F (37 C)  TempSrc: Oral  Oral Oral  SpO2:  98% 100% 95%  Weight:    95.5 kg  Height:        Intake/Output Summary (Last 24 hours) at 12/21/2019 1409 Last data filed at 12/20/2019 1900 Gross per 24 hour  Intake 0 ml  Output 2900 ml  Net -2900 ml   Filed Weights   12/05/19 1757 12/21/19 1107  Weight: 95.3 kg 95.5 kg    Examination:  General exam: Appears calm and comfortable  Respiratory system:  Clear to auscultation. Respiratory effort normal. Cardiovascular system: S1 & S2 heard, RRR. No JVD, murmurs, rubs, gallops or clicks. No pedal edema. Gastrointestinal system: Abdomen is nondistended, soft and nontender. No organomegaly or masses felt. Normal bowel sounds heard. Central nervous system: Alert and oriented. No focal neurological deficits. Extremities: Symmetric  Skin: No rashes, lesions or ulcers Psychiatry:  Mood & affect appropriate.     Data Reviewed: I have personally reviewed following labs and imaging studies  CBC: Recent Labs  Lab 12/15/19 1000 12/17/19 1116 12/17/19 1908  WBC 5.5 5.4  --   NEUTROABS 3.9 3.9  --   HGB 7.5* 7.2* 8.2*  HCT 24.3* 23.4* 26.6*  MCV 96.0 95.9  --   PLT 130* 149*  --    Basic Metabolic Panel: Recent Labs  Lab 12/15/19 1000 12/17/19 1116  NA 138 137  K 5.6* 4.2  CL 96* 96*  CO2 29 31  GLUCOSE 108* 112*  BUN 43* 28*  CREATININE 5.67* 4.21*  CALCIUM 9.3 9.1  MG 2.0  --   PHOS 4.9* 3.4   GFR: Estimated Creatinine Clearance: 24.4 mL/min (A) (by C-G formula based on SCr of 4.21 mg/dL (H)). Liver Function Tests: Recent Labs  Lab 12/15/19 1000 12/17/19 1116  ALBUMIN 2.9* 2.9*   No results for input(s): LIPASE, AMYLASE in the last 168 hours. No results for input(s): AMMONIA in the last 168 hours. Coagulation Profile: No results for input(s): INR, PROTIME in the last 168 hours. Cardiac Enzymes: No results for input(s): CKTOTAL, CKMB, CKMBINDEX, TROPONINI in the last 168 hours. BNP (last 3 results) No results for input(s): PROBNP in the last 8760 hours. HbA1C: No results for input(s): HGBA1C in the last 72 hours. CBG: No results for input(s): GLUCAP in the last 168 hours. Lipid Profile: No results for input(s): CHOL, HDL, LDLCALC, TRIG, CHOLHDL, LDLDIRECT in the last 72 hours. Thyroid Function Tests: No results for input(s): TSH, T4TOTAL, FREET4, T3FREE, THYROIDAB in the last 72 hours. Anemia Panel: No results  for input(s): VITAMINB12, FOLATE, FERRITIN, TIBC, IRON, RETICCTPCT in the last 72 hours. Sepsis Labs: No results for input(s): PROCALCITON, LATICACIDVEN in the last 168 hours.  Recent Results (from the past 240 hour(s))  Resp Panel by RT-PCR (Flu A&B, Covid) Nasopharyngeal Swab     Status: None   Collection Time: 12/16/19 12:03 PM   Specimen: Nasopharyngeal Swab; Nasopharyngeal(NP) swabs in vial transport medium  Result Value Ref Range Status   SARS Coronavirus 2 by RT PCR NEGATIVE NEGATIVE Final    Comment: (NOTE) SARS-CoV-2 target nucleic acids are NOT DETECTED.  The SARS-CoV-2 RNA is generally detectable in upper respiratory specimens during the acute phase of infection. The lowest concentration of SARS-CoV-2 viral copies this assay can detect is 138 copies/mL. A negative result does not preclude SARS-Cov-2 infection and should not be used as the sole basis for treatment or  other patient management decisions. A negative result may occur with  improper specimen collection/handling, submission of specimen other than nasopharyngeal swab, presence of viral mutation(s) within the areas targeted by this assay, and inadequate number of viral copies(<138 copies/mL). A negative result must be combined with clinical observations, patient history, and epidemiological information. The expected result is Negative.  Fact Sheet for Patients:  EntrepreneurPulse.com.au  Fact Sheet for Healthcare Providers:  IncredibleEmployment.be  This test is no t yet approved or cleared by the [place] FDA and  has been authorized for detection and/or diagnosis of SARS-CoV-2 by FDA under an Emergency Use Authorization (EUA). This EUA will remain  in effect (meaning this test can be used) for the duration of the COVID-19 declaration under Section 564(b)(1) of the Act, 21 U.S.C.section 360bbb-3(b)(1), unless the authorization is terminated  or revoked sooner.        Influenza A by PCR NEGATIVE NEGATIVE Final   Influenza B by PCR NEGATIVE NEGATIVE Final    Comment: (NOTE) The Xpert Xpress SARS-CoV-2/FLU/RSV plus assay is intended as an aid in the diagnosis of influenza from Nasopharyngeal swab specimens and should not be used as a sole basis for treatment. Nasal washings and aspirates are unacceptable for Xpert Xpress SARS-CoV-2/FLU/RSV testing.  Fact Sheet for Patients: EntrepreneurPulse.com.au  Fact Sheet for Healthcare Providers: IncredibleEmployment.be  This test is not yet approved or cleared by the [place] FDA and has been authorized for detection and/or diagnosis of SARS-CoV-2 by FDA under an Emergency Use Authorization (EUA). This EUA will remain in effect (meaning this test can be used) for the duration of the COVID-19 declaration under Section 564(b)(1) of the Act, 21 U.S.C. section 360bbb-3(b)(1), unless the authorization is terminated or revoked.  Performed at [facility], [address], [place], [place] [address]          Radiology Studies: No results found.      Scheduled Meds: . aspirin EC  81 mg Oral Daily  . cephALEXin  500 mg Oral Q12H  . collagenase   Topical Daily  . epoetin (EPOGEN/PROCRIT) injection  10,000 Units Intravenous Q T,Th,Sa-HD  . feeding supplement (NEPRO CARB STEADY)  237 mL Oral BID BM  . ferrous sulfate  325 mg Oral BID  . heparin  5,000 Units Subcutaneous Q8H  . midodrine  10 mg Oral TID WC  . multivitamin  1 tablet Oral QHS  . pantoprazole  40 mg Oral BID  . predniSONE  10 mg Oral Daily  . sevelamer carbonate  800 mg Oral BID WC  . sodium bicarbonate  1,300 mg Oral BID  . tamsulosin  0.4 mg Oral Once per day on Sun Mon Wed Fri  . traZODone  50 mg Oral QHS   Continuous Infusions: . albumin human 25 g (12/20/19 1123)     LOS: 15 days    Time spent: 28 minutes    Sharen Hones, MD [facility]   To contact the  attending provider between 7A-7P or the covering provider during after hours 7P-7A, please log into the web site [email or web address] and access using universal [place] password for that web site. If you do not have the password, please call the hospital operator.  12/21/2019, 2:09 PM

## 2019-12-21 NOTE — Progress Notes (Signed)
Patient weighs 210.5 lbs.  Patient is able to sit up in chairs for dialysis, uses bedside commode and independently grooms and performs ADLs while sitting on chair or standing.

## 2019-12-21 NOTE — TOC Progression Note (Addendum)
Transition of Care [facility]) - Progression Note    Patient Details  Name: [name] MRN: [id number] Date of Birth: 05/03/1970  Transition of Care [facility]) CM/SW Contact  Beverly Sessions, RN Phone Number: 12/21/2019, 2:28 PM  Clinical Narrative:     Outpatient HD in [place] has not been confirmed.  Nephology has completed a peer to peer with the outpatient HD center.  There is concern about patient blood pressure and potassium level   VA auth for [facility] inpatient rehab is good through Friday  Jace at [place] will be off Thursday and Friday.  Sharyn Lull  Will be covering in his absence [phone]  Contact for VA that gave approval for inpatient rehab stay [phone]      Expected Discharge Plan: [place] Barriers to Discharge: Continued Medical Work up  Expected Discharge Plan and Services Expected Discharge Plan: [place] In-house Referral: Clinical Social Work Discharge Planning Services: CM Consult Post Acute Care Choice: [place] arrangements for the past 2 months: Single Family Home Expected Discharge Date: 12/17/19               DME Arranged: N/A DME Agency: NA         HH Agency: [place] ([facility])         Social Determinants of Health (SDOH) Interventions    Readmission Risk Interventions Readmission Risk Prevention Plan 12/06/2019 11/24/2019  Transportation Screening Complete Complete  PCP or Specialist Appt within 3-5 Days - Complete  HRI or [place] Complete Complete  Social Work Consult for [place] Planning/Counseling Complete Complete  Palliative Care Screening Not Applicable Not Applicable  Medication Review [employer]) - Referral to Pharmacy  Some recent data might be hidden

## 2019-12-21 NOTE — Progress Notes (Signed)
Mobility Specialist - Progress Note   12/21/19 1141  Mobility  Activity Stood at bedside  Range of Motion/Exercises Right arm;Left arm (brushed teeth)  Level of Assistance Standby assist, set-up cues, supervision of patient - no hands on  Assistive Device None (Rails)  Distance Ambulated (ft) 0 ft  Mobility Response Tolerated well  Mobility performed by Mobility specialist  $Mobility charge 1 Mobility    Pt was lying in bed upon arrival. Pt agreed to session. Pt c/o pain in RLE, but was not limited for mobility. Pt brushed his teeth while long-sitting in bed, set up by NT. Pt was able to get EOB with no physical assistance. Pt stood from elevated bed height with supervision. No AD used, pt relies on railing from bed and weighted scale for support. Bed height lowered prior to returning EOB. Pt transferred EOB-supine with modA for support on LE. Overall, pt tolerated session well. Pt was left in bed with all needs in reach and nursing staff notified.    Kathee Delton Mobility Specialist 12/21/19, 11:49 AM

## 2019-12-22 LAB — RENAL FUNCTION PANEL
Albumin: 3.4 g/dL — ABNORMAL LOW (ref 3.5–5.0)
Anion gap: 14 (ref 5–15)
BUN: 50 mg/dL — ABNORMAL HIGH (ref 6–20)
CO2: 27 mmol/L (ref 22–32)
Calcium: 9.1 mg/dL (ref 8.9–10.3)
Chloride: 97 mmol/L — ABNORMAL LOW (ref 98–111)
Creatinine, Ser: 5.99 mg/dL — ABNORMAL HIGH (ref 0.61–1.24)
GFR, Estimated: 11 mL/min — ABNORMAL LOW (ref >60)
Glucose, Bld: 97 mg/dL (ref 70–99)
Phosphorus: 4.1 mg/dL (ref 2.5–4.6)
Potassium: 5.2 mmol/L — ABNORMAL HIGH (ref 3.5–5.1)
Sodium: 138 mmol/L (ref 135–145)

## 2019-12-22 LAB — IRON AND TIBC
Iron: 39 ug/dL — ABNORMAL LOW (ref 45–182)
Saturation Ratios: 21 % (ref 17.9–39.5)
TIBC: 190 ug/dL — ABNORMAL LOW (ref 250–450)
UIBC: 151 ug/dL

## 2019-12-22 LAB — VITAMIN B12: Vitamin B-12: 439 pg/mL (ref 180–914)

## 2019-12-22 LAB — CBC
HCT: 26.1 % — ABNORMAL LOW (ref 39.0–52.0)
Hemoglobin: 8.1 g/dL — ABNORMAL LOW (ref 13.0–17.0)
MCH: 28.9 pg (ref 26.0–34.0)
MCHC: 31 g/dL (ref 30.0–36.0)
MCV: 93.2 fL (ref 80.0–100.0)
Platelets: 130 10*3/uL — ABNORMAL LOW (ref 150–400)
RBC: 2.8 MIL/uL — ABNORMAL LOW (ref 4.22–5.81)
RDW: 16 % — ABNORMAL HIGH (ref 11.5–15.5)
WBC: 5.3 10*3/uL (ref 4.0–10.5)
nRBC: 0 % (ref 0.0–0.2)

## 2019-12-22 NOTE — Progress Notes (Signed)
OT Cancellation Note  Patient Details Name: [name] MRN: [id number] DOB: November 29, 1970   Cancelled Treatment:    Reason Eval/Treat Not Completed: Patient at procedure or test/ unavailable. Pt currently receiving dialysis treatment. OT to re-attempt when pt is next available.   Darleen Crocker, [place], OTR/L , CBIS ascom [phone]  12/22/19, 1:05 PM   12/22/2019, 1:04 PM

## 2019-12-22 NOTE — Progress Notes (Signed)
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 His case was communicated with ID andhecompleted antibiotics on 12/09/2019.Improved and stable. Medically stable for DC. Repeat Covid test 11/26: Negative. Inpatient rehab refused to take patient on 11/27. TOC team working him up again for potential DC to inpatient rehab including insurance preauthorization. Prior to admission, patient lived alone, and stated that he was independent and did not require any assistance to ambulate. Currently deconditioned from multiple acute on chronic medical conditions. He is slowly recovering from this. He is very motivated to proceed with intense rehab so that he can return to his prior level of independent functioning and eventually home. Thereby inpatient rehab and not SNF is the most appropriate discharge disposition for this patient at this time. He is medically stable for discharge at this time.    Assessment & Plan:   Principal Problem:   Hyperkalemia Active Problems:   Cellulitis of right lower extremity   ESRD on hemodialysis (HCC)   Systemic lupus erythematosus, unspecified ([place])   History of Renal transplant failure   Pseudomonas aeruginosa wound infection  #1.  End-stage renal disease on dialysis with a failed renal transplant. Hyperkalemia SLE. Patient is stable on dialysis.  2.   Pseudomonas wound infection. Cellulitis of the right lower extremity. Right lower extremity wound. Complete antibiotics.  Condition stable.  3.  Anemia of chronic kidney disease. Thrombocytopenia. Condition stable.  4.  Soft tissue infection at the buttock. Stable.  Continue Keflex    DVT prophylaxis: Heparin Code Status: full Family Communication: None Disposition Plan:  .   Status is: Inpatient  Remains inpatient appropriate because:Unsafe d/c plan   Dispo: The patient is from: Home              Anticipated d/c is to: ?              Anticipated d/c date is: 1 day              Patient currently is medically stable to d/c.        I/O last 3 completed shifts: In: 42 [P.O.:1320] Out: 0  Total I/O In: 360 [P.O.:360] Out: -      Consultants:   Nephrology  Procedures: HD  Antimicrobials: None  Subjective: Patient doing well today.  Does not have any short of breath or cough. No abdominal pain or nausea vomiting. No fever or chills Patient feels stronger now, he was able to walk in the hallway with minimal assist  Objective: Vitals:   12/21/19 1107 12/21/19 2256 12/22/19 0700 12/22/19 1115  BP: 105/69 106/73 107/81   Pulse: 79 72 69   Resp: 18 16 16    Temp: 98.6 F (37 C) 98.7 F (37.1 C) 98 F (36.7 C) 98.2 F (36.8 C)  TempSrc: Oral Oral Oral Oral  SpO2: 95% 95% 98%   Weight: 95.5 kg     Height:  Intake/Output Summary (Last 24 hours) at 12/22/2019 1359 Last data filed at 12/22/2019 0900 Gross per 24 hour  Intake 1680 ml  Output 0 ml  Net 1680 ml   Filed Weights   12/05/19 1757 12/21/19 1107  Weight: 95.3 kg 95.5 kg    Examination:  General exam: Appears calm and comfortable  Respiratory system: Clear to auscultation. Respiratory effort normal. Cardiovascular system: S1 & S2 heard, RRR. No JVD, murmurs, rubs, gallops or clicks. 1+ pedal edema. Gastrointestinal system: Abdomen is nondistended, soft and nontender. No  organomegaly or masses felt. Normal bowel sounds heard. Central nervous system: Alert and oriented. No focal neurological deficits. Extremities: Symmetric  Skin: No rashes, lesions or ulcers Psychiatry:  Mood & affect appropriate.     Data Reviewed: I have personally reviewed following labs and imaging studies  CBC: Recent Labs  Lab 12/17/19 1116 12/17/19 1908 12/22/19 1118  WBC 5.4  --  5.3  NEUTROABS 3.9  --   --   HGB 7.2* 8.2* 8.1*  HCT 23.4* 26.6* 26.1*  MCV 95.9  --  93.2  PLT 149*  --  017*   Basic Metabolic Panel: Recent Labs  Lab 12/17/19 1116 12/22/19 1118  NA 137 138  K 4.2 5.2*  CL 96* 97*  CO2 31 27  GLUCOSE 112* 97  BUN 28* 50*  CREATININE 4.21* 5.99*  CALCIUM 9.1 9.1  PHOS 3.4 4.1   GFR: Estimated Creatinine Clearance: 17.2 mL/min (A) (by C-G formula based on SCr of 5.99 mg/dL (H)). Liver Function Tests: Recent Labs  Lab 12/17/19 1116 12/22/19 1118  ALBUMIN 2.9* 3.4*   No results for input(s): LIPASE, AMYLASE in the last 168 hours. No results for input(s): AMMONIA in the last 168 hours. Coagulation Profile: No results for input(s): INR, PROTIME in the last 168 hours. Cardiac Enzymes: No results for input(s): CKTOTAL, CKMB, CKMBINDEX, TROPONINI in the last 168 hours. BNP (last 3 results) No results for input(s): PROBNP in the last 8760 hours. HbA1C: No results for input(s): HGBA1C in the last 72 hours. CBG: No results for input(s): GLUCAP in the last 168 hours. Lipid Profile: No results for input(s): CHOL, HDL, LDLCALC, TRIG, CHOLHDL, LDLDIRECT in the last 72 hours. Thyroid Function Tests: No results for input(s): TSH, T4TOTAL, FREET4, T3FREE, THYROIDAB in the last 72 hours. Anemia Panel: Recent Labs    12/22/19 1118  TIBC 190*  IRON 39*   Sepsis Labs: No results for input(s): PROCALCITON, LATICACIDVEN in the last 168 hours.  Recent Results (from the past 240 hour(s))  Resp Panel by RT-PCR (Flu A&B, Covid) Nasopharyngeal Swab      Status: None   Collection Time: 12/16/19 12:03 PM   Specimen: Nasopharyngeal Swab; Nasopharyngeal(NP) swabs in vial transport medium  Result Value Ref Range Status   SARS Coronavirus 2 by RT PCR NEGATIVE NEGATIVE Final    Comment: (NOTE) SARS-CoV-2 target nucleic acids are NOT DETECTED.  The SARS-CoV-2 RNA is generally detectable in upper respiratory specimens during the acute phase of infection. The lowest concentration of SARS-CoV-2 viral copies this assay can detect is 138 copies/mL. A negative result does not preclude SARS-Cov-2 infection and should not be used as the sole basis for treatment or other patient management decisions. A negative result may occur with  improper specimen collection/handling, submission of specimen other than nasopharyngeal swab, presence of viral mutation(s) within the areas targeted by this assay, and inadequate number of viral copies(<138 copies/mL). A negative result must be combined with clinical observations, patient history,  and epidemiological information. The expected result is Negative.  Fact Sheet for Patients:  EntrepreneurPulse.com.au  Fact Sheet for Healthcare Providers:  IncredibleEmployment.be  This test is no t yet approved or cleared by the [place] FDA and  has been authorized for detection and/or diagnosis of SARS-CoV-2 by FDA under an Emergency Use Authorization (EUA). This EUA will remain  in effect (meaning this test can be used) for the duration of the COVID-19 declaration under Section 564(b)(1) of the Act, 21 U.S.C.section 360bbb-3(b)(1), unless the authorization is terminated  or revoked sooner.       Influenza A by PCR NEGATIVE NEGATIVE Final   Influenza B by PCR NEGATIVE NEGATIVE Final    Comment: (NOTE) The Xpert Xpress SARS-CoV-2/FLU/RSV plus assay is intended as an aid in the diagnosis of influenza from Nasopharyngeal swab specimens and should not be used as a sole basis  for treatment. Nasal washings and aspirates are unacceptable for Xpert Xpress SARS-CoV-2/FLU/RSV testing.  Fact Sheet for Patients: EntrepreneurPulse.com.au  Fact Sheet for Healthcare Providers: IncredibleEmployment.be  This test is not yet approved or cleared by the [place] FDA and has been authorized for detection and/or diagnosis of SARS-CoV-2 by FDA under an Emergency Use Authorization (EUA). This EUA will remain in effect (meaning this test can be used) for the duration of the COVID-19 declaration under Section 564(b)(1) of the Act, 21 U.S.C. section 360bbb-3(b)(1), unless the authorization is terminated or revoked.  Performed at [facility], [address] [place], [place] [address]          Radiology Studies: No results found.      Scheduled Meds: . aspirin EC  81 mg Oral Daily  . cephALEXin  500 mg Oral Q12H  . collagenase   Topical Daily  . epoetin (EPOGEN/PROCRIT) injection  10,000 Units Intravenous Q T,Th,Sa-HD  . feeding supplement (NEPRO CARB STEADY)  237 mL Oral BID BM  . ferrous sulfate  325 mg Oral BID  . heparin  5,000 Units Subcutaneous Q8H  . midodrine  10 mg Oral TID WC  . multivitamin  1 tablet Oral QHS  . pantoprazole  40 mg Oral BID  . predniSONE  10 mg Oral Daily  . sevelamer carbonate  800 mg Oral BID WC  . sodium bicarbonate  1,300 mg Oral BID  . tamsulosin  0.4 mg Oral Once per day on Sun Mon Wed Fri  . traZODone  50 mg Oral QHS   Continuous Infusions: . albumin human 25 g (12/22/19 1157)     LOS: 16 days    Time spent: 26 minutes    Sharen Hones, MD [facility]   To contact the attending provider between 7A-7P or the covering provider during after hours 7P-7A, please log into the web site [email or web address] and access using universal [place] password for that web site. If you do not have the password, please call the hospital operator.  12/22/2019, 1:59 PM

## 2019-12-22 NOTE — Progress Notes (Signed)
°   12/22/19 2876  Charting Type  Charting Type Shift assessment  Assessment of needs addressed Yes  Orders Chart Check (once per shift) Completed  [place] Work Intensity Score (Update with each assessment and as needed)  Work Intensity Score (Level) 2  Level 2 Intensity B.High fall risk patient that is oriented and follows commands  Neurological  Neuro (WDL) WDL  NuDESC - Delirium Risk Factor Assessment (Complete for non-ICU patients)  Delirium Risk Factor Assessment Severe illness / infection  NuDESC - Nursing Delirium Screening Scale (Complete for non-ICU patients)  Disorientation 0  Inappropriate Behavior 1  Inappropriate Communications 0  Illusions/hallucinations 0  Psychomotor Retardation 0  NuDESC Total Score 1  NuDESC - Delirium Prevention:  Universal Requirements (Complete for all non-ICU patients with a delirium risk factor)  Universal Precautions Initiated *See Row Information* Yes  NuDESC - Interventions for patient with NuDESC equal to or greater than 2 (Complete for non-ICU patients)  NuDESC Positive:  Interventions Continue Universal (preventative) measures;Utilize bed alarms  Integumentary  Integumentary (WDL) X  Skin Condition Dry;Flaky  Skin Integrity Cellulitis  Cellulitis Location Leg  Cellulitis Location Orientation Right  Cellulitis Intervention Foam;Gauze  Braden Scale (Ages 52 and up)  Sensory Perceptions 3  Moisture 3  Activity 3  Mobility 3  Nutrition 3  Friction and Shear 3  Braden Scale Score 18  Pressure Injury 11/29/19 Heel Right Deep Tissue Pressure Injury - Purple or maroon localized area of discolored intact skin or blood-filled blister due to damage of underlying soft tissue from pressure and/or shear.  Date First Assessed/Time First Assessed: 11/29/19 0945   Location: Heel  Location Orientation: Right  Staging: Deep Tissue Pressure Injury - Purple or maroon localized area of discolored intact skin or blood-filled blister due to damage of  underlying ...  Treatment Off loading  Wound / Incision (Open or Dehisced) 11/18/19 Non-pressure wound Leg Right  Date First Assessed/Time First Assessed: 11/18/19 0000   Wound Type: Non-pressure wound  Location: Leg  Location Orientation: Right  Present on Admission: Yes  Dressing Type Foam - Lift dressing to assess site every shift  Dressing Changed Changed  Dressing Status Clean;Dry;Intact  Dressing Change Frequency Daily  Margins Unattached edges (unapproximated)  Closure None  Drainage Amount Scant  Drainage Description Serosanguineous  Non-staged Wound Description Partial thickness  Treatment Cleansed  Neurological  Level of Consciousness Alert  Assessment charted of best of ability as was aggressive and wanted to be left alone. See prior note.

## 2019-12-22 NOTE — Treatment Plan (Signed)
Pt to dialysis in chair at this time.

## 2019-12-22 NOTE — Treatment Plan (Signed)
Patient requested to leave him alone, that this Probation officer and student were disrupting his breakfast and wanted to be left alone, does not want any of his medication as he states he dont take them. This Probation officer apologized to patient for disrupting him and asked that he atleast take his cephalexin. Dialysis called and asked for time to conveince patient dialysis states they will call with time later. Medication left on breakfast tray as patient hits his plate with the lid in aggression towards being interrupted. Probation officer and student left room.

## 2019-12-22 NOTE — Progress Notes (Signed)
[facility]  ROUNDING NOTE   Subjective:    Patient seen during dialysis Tolerating well    HEMODIALYSIS FLOWSHEET:  Blood Flow Rate (mL/min): 200 mL/min Arterial Pressure (mmHg): -150 mmHg Venous Pressure (mmHg): 170 mmHg Transmembrane Pressure (mmHg): 90 mmHg Ultrafiltration Rate (mL/min): 1410 mL/min Dialysate Flow Rate (mL/min): 800 ml/min Conductivity: Machine : 13.8 Conductivity: Machine : 13.8 Dialysis Fluid Bolus: Normal Saline Bolus Amount (mL): 300 mL Dialysate Change: 2K   Patient sitting up in chair BP is low normal, Patient is alert and oriented A Fib on tele monitor Sats 100 % on 3 L by [place] O2 which patient requests for comfort during HD Otherwise on room air   Objective:  Vital signs in last 24 hours:  Temp:  [98 F (36.7 C)-98.7 F (37.1 C)] 98.2 F (36.8 C) (12/02 1115) Pulse Rate:  [69-72] 69 (12/02 0700) Resp:  [16] 16 (12/02 0700) BP: (106-107)/(73-81) 107/81 (12/02 0700) SpO2:  [95 %-98 %] 98 % (12/02 0700)  Weight change:  Filed Weights   12/05/19 1757 12/21/19 1107  Weight: 95.3 kg 95.5 kg    Intake/Output: I/O last 3 completed shifts: In: 1320 [P.O.:1320] Out: 0    Intake/Output this shift:  Total I/O In: 360 [P.O.:360] Out: -   Physical Exam: General:  in chair,no acute distress  Head:  Normocephalic, atraumatic  Eyes:  Sclera and conjunctiva clear  Lungs:   Respirations symmetrical, unlabored, lungs clear  Heart:  S1S2, no rubs or gallops  Abdomen:  Soft, non tender,non distended  Extremities:  Lower extremity edema 1+  Neurologic:  Awake, alert, oriented  Skin:  leg with dressing clean,dry and intact  Access: Left AVG      Basic Metabolic Panel: Recent Labs  Lab 12/17/19 1116  NA 137  K 4.2  CL 96*  CO2 31  GLUCOSE 112*  BUN 28*  CREATININE 4.21*  CALCIUM 9.1  PHOS 3.4    Liver Function Tests: Recent Labs  Lab 12/17/19 1116  ALBUMIN 2.9*   No results for input(s): LIPASE, AMYLASE in the  last 168 hours. No results for input(s): AMMONIA in the last 168 hours.  CBC: Recent Labs  Lab 12/17/19 1116 12/17/19 1908 12/22/19 1118  WBC 5.4  --  5.3  NEUTROABS 3.9  --   --   HGB 7.2* 8.2* 8.1*  HCT 23.4* 26.6* 26.1*  MCV 95.9  --  93.2  PLT 149*  --  130*    Cardiac Enzymes: No results for input(s): CKTOTAL, CKMB, CKMBINDEX, TROPONINI in the last 168 hours.  BNP: Invalid input(s): POCBNP  CBG: No results for input(s): GLUCAP in the last 168 hours.  Microbiology: Results for orders placed or performed during the hospital encounter of 12/05/19  Respiratory Panel by RT PCR (Flu A&B, Covid) - Nasopharyngeal Swab     Status: None   Collection Time: 12/05/19 10:04 PM   Specimen: Nasopharyngeal Swab  Result Value Ref Range Status   SARS Coronavirus 2 by RT PCR NEGATIVE NEGATIVE Final    Comment: (NOTE) SARS-CoV-2 target nucleic acids are NOT DETECTED.  The SARS-CoV-2 RNA is generally detectable in upper respiratoy specimens during the acute phase of infection. The lowest concentration of SARS-CoV-2 viral copies this assay can detect is 131 copies/mL. A negative result does not preclude SARS-Cov-2 infection and should not be used as the sole basis for treatment or other patient management decisions. A negative result may occur with  improper specimen collection/handling, submission of specimen other than nasopharyngeal  swab, presence of viral mutation(s) within the areas targeted by this assay, and inadequate number of viral copies (<131 copies/mL). A negative result must be combined with clinical observations, patient history, and epidemiological information. The expected result is Negative.  Fact Sheet for Patients:  PinkCheek.be  Fact Sheet for Healthcare Providers:  GravelBags.it  This test is no t yet approved or cleared by the [place] FDA and  has been authorized for detection and/or diagnosis  of SARS-CoV-2 by FDA under an Emergency Use Authorization (EUA). This EUA will remain  in effect (meaning this test can be used) for the duration of the COVID-19 declaration under Section 564(b)(1) of the Act, 21 U.S.C. section 360bbb-3(b)(1), unless the authorization is terminated or revoked sooner.     Influenza A by PCR NEGATIVE NEGATIVE Final   Influenza B by PCR NEGATIVE NEGATIVE Final    Comment: (NOTE) The Xpert Xpress SARS-CoV-2/FLU/RSV assay is intended as an aid in  the diagnosis of influenza from Nasopharyngeal swab specimens and  should not be used as a sole basis for treatment. Nasal washings and  aspirates are unacceptable for Xpert Xpress SARS-CoV-2/FLU/RSV  testing.  Fact Sheet for Patients: PinkCheek.be  Fact Sheet for Healthcare Providers: GravelBags.it  This test is not yet approved or cleared by the [place] FDA and  has been authorized for detection and/or diagnosis of SARS-CoV-2 by  FDA under an Emergency Use Authorization (EUA). This EUA will remain  in effect (meaning this test can be used) for the duration of the  Covid-19 declaration under Section 564(b)(1) of the Act, 21  U.S.C. section 360bbb-3(b)(1), unless the authorization is  terminated or revoked. Performed at [facility], [address] [place], [place] [address]   Resp Panel by RT-PCR (Flu A&B, Covid) Nasopharyngeal Swab     Status: None   Collection Time: 12/16/19 12:03 PM   Specimen: Nasopharyngeal Swab; Nasopharyngeal(NP) swabs in vial transport medium  Result Value Ref Range Status   SARS Coronavirus 2 by RT PCR NEGATIVE NEGATIVE Final    Comment: (NOTE) SARS-CoV-2 target nucleic acids are NOT DETECTED.  The SARS-CoV-2 RNA is generally detectable in upper respiratory specimens during the acute phase of infection. The lowest concentration of SARS-CoV-2 viral copies this assay can detect is 138 copies/mL. A  negative result does not preclude SARS-Cov-2 infection and should not be used as the sole basis for treatment or other patient management decisions. A negative result may occur with  improper specimen collection/handling, submission of specimen other than nasopharyngeal swab, presence of viral mutation(s) within the areas targeted by this assay, and inadequate number of viral copies(<138 copies/mL). A negative result must be combined with clinical observations, patient history, and epidemiological information. The expected result is Negative.  Fact Sheet for Patients:  EntrepreneurPulse.com.au  Fact Sheet for Healthcare Providers:  IncredibleEmployment.be  This test is no t yet approved or cleared by the [place] FDA and  has been authorized for detection and/or diagnosis of SARS-CoV-2 by FDA under an Emergency Use Authorization (EUA). This EUA will remain  in effect (meaning this test can be used) for the duration of the COVID-19 declaration under Section 564(b)(1) of the Act, 21 U.S.C.section 360bbb-3(b)(1), unless the authorization is terminated  or revoked sooner.       Influenza A by PCR NEGATIVE NEGATIVE Final   Influenza B by PCR NEGATIVE NEGATIVE Final    Comment: (NOTE) The Xpert Xpress SARS-CoV-2/FLU/RSV plus assay is intended as an aid in the diagnosis of influenza  from Nasopharyngeal swab specimens and should not be used as a sole basis for treatment. Nasal washings and aspirates are unacceptable for Xpert Xpress SARS-CoV-2/FLU/RSV testing.  Fact Sheet for Patients: EntrepreneurPulse.com.au  Fact Sheet for Healthcare Providers: IncredibleEmployment.be  This test is not yet approved or cleared by the [place] FDA and has been authorized for detection and/or diagnosis of SARS-CoV-2 by FDA under an Emergency Use Authorization (EUA). This EUA will remain in effect (meaning this test can  be used) for the duration of the COVID-19 declaration under Section 564(b)(1) of the Act, 21 U.S.C. section 360bbb-3(b)(1), unless the authorization is terminated or revoked.  Performed at [facility], [address] [place], [place] [address]     Coagulation Studies: No results for input(s): LABPROT, INR in the last 72 hours.  Urinalysis: No results for input(s): COLORURINE, LABSPEC, PHURINE, GLUCOSEU, HGBUR, BILIRUBINUR, KETONESUR, PROTEINUR, UROBILINOGEN, NITRITE, LEUKOCYTESUR in the last 72 hours.  Invalid input(s): APPERANCEUR    Imaging: No results found.   Medications:   . albumin human 25 g (12/22/19 1157)   . aspirin EC  81 mg Oral Daily  . cephALEXin  500 mg Oral Q12H  . collagenase   Topical Daily  . epoetin (EPOGEN/PROCRIT) injection  10,000 Units Intravenous Q T,Th,Sa-HD  . feeding supplement (NEPRO CARB STEADY)  237 mL Oral BID BM  . ferrous sulfate  325 mg Oral BID  . heparin  5,000 Units Subcutaneous Q8H  . midodrine  10 mg Oral TID WC  . multivitamin  1 tablet Oral QHS  . pantoprazole  40 mg Oral BID  . predniSONE  10 mg Oral Daily  . sevelamer carbonate  800 mg Oral BID WC  . sodium bicarbonate  1,300 mg Oral BID  . tamsulosin  0.4 mg Oral Once per day on Sun Mon Wed Fri  . traZODone  50 mg Oral QHS   acetaminophen **OR** acetaminophen, calcium carbonate, loperamide, ondansetron **OR** ondansetron (ZOFRAN) IV, oxyCODONE, promethazine  Assessment/ Plan:  [name] is a 49 y.o. black male with end stage renal disease on hemodialysis, history of kidney transplant, lupus nephritis, and hypotension who was admitted to [facility] on 12/05/2019 for Hyperkalemia [E87.5] Cellulitis of right lower extremity [L03.115]  Left arm AVG/TTS  # End stage renal disease.  Patient seen during dialysis Tolerating well  We will plan for TTS schedule while hospitalized Dialyzed in chair today. Doing well.  No heparin during HD - patient claims "he  does not need it. It made him sick in the past" use saline flushes Last Standing weight 95 kg Today's post weight pending  # Chronic hypotension Refusing to take midodrine States it makes his BP go lower Requesting iv albumin with each treatment Discussed with patient that iv albumin is not available as outpatient  # LE Edema Still has residual edema  Fluid removal with HD as tolerated Goal - 2.5 L today  #Anemia with chronic kidney disease.   Lab Results  Component Value Date   HGB 8.1 (L) 12/22/2019  Continue Epogen with dialysis  #Secondary Hyperparathyroidism Lab Results  Component Value Date   CALCIUM 9.1 12/17/2019   PHOS 3.4 12/17/2019  Patient is on sevelamer     LOS: Worton 12/2/202112:30 PM  I saw and evaluated the patient and discussed the care with Crosby Oyster, DNP.  I agree with the findings and plan as documented in the note.    Murlean Iba , MD [facility] 12/2/202112:30  PM

## 2019-12-23 LAB — CBC WITH DIFFERENTIAL/PLATELET
Abs Immature Granulocytes: 0.04 10*3/uL (ref 0.00–0.07)
Basophils Absolute: 0.1 10*3/uL (ref 0.0–0.1)
Basophils Relative: 1 %
Eosinophils Absolute: 0.1 10*3/uL (ref 0.0–0.5)
Eosinophils Relative: 2 %
HCT: 28 % — ABNORMAL LOW (ref 39.0–52.0)
Hemoglobin: 8.7 g/dL — ABNORMAL LOW (ref 13.0–17.0)
Immature Granulocytes: 1 %
Lymphocytes Relative: 15 %
Lymphs Abs: 0.9 10*3/uL (ref 0.7–4.0)
MCH: 29.2 pg (ref 26.0–34.0)
MCHC: 31.1 g/dL (ref 30.0–36.0)
MCV: 94 fL (ref 80.0–100.0)
Monocytes Absolute: 0.9 10*3/uL (ref 0.1–1.0)
Monocytes Relative: 16 %
Neutro Abs: 3.8 10*3/uL (ref 1.7–7.7)
Neutrophils Relative %: 65 %
Platelets: 132 10*3/uL — ABNORMAL LOW (ref 150–400)
RBC: 2.98 MIL/uL — ABNORMAL LOW (ref 4.22–5.81)
RDW: 16 % — ABNORMAL HIGH (ref 11.5–15.5)
WBC: 5.8 10*3/uL (ref 4.0–10.5)
nRBC: 0 % (ref 0.0–0.2)

## 2019-12-23 LAB — BASIC METABOLIC PANEL
Anion gap: 10 (ref 5–15)
BUN: 39 mg/dL — ABNORMAL HIGH (ref 6–20)
CO2: 28 mmol/L (ref 22–32)
Calcium: 9.4 mg/dL (ref 8.9–10.3)
Chloride: 99 mmol/L (ref 98–111)
Creatinine, Ser: 4.83 mg/dL — ABNORMAL HIGH (ref 0.61–1.24)
GFR, Estimated: 14 mL/min — ABNORMAL LOW (ref >60)
Glucose, Bld: 70 mg/dL (ref 70–99)
Potassium: 5.1 mmol/L (ref 3.5–5.1)
Sodium: 137 mmol/L (ref 135–145)

## 2019-12-23 NOTE — Progress Notes (Signed)
Patient has been clinically accepted for outpatient dialysis at the [facility] clinic TTS 11:00am. Patient can start on Tueday 12/7 at 10:45am. Clinic requires a COVID test 72 hours before start date. Also upon discharge from inpatient rehab, patient will go to [facility] TTS 5:45am for continued treatments. The clinic manager Whitman Hero will need to be made aware of when to except patient. Please reach out to her at [phone]. This clinic also requires a COVID test 72 hours prior first treatment. Please call me with any other dialysis questions.  [name] Dialysis Coordinator [phone]

## 2019-12-23 NOTE — TOC Transition Note (Signed)
Transition of Care [facility]) - CM/SW Discharge Note   Patient Details  Name: [name] MRN: [id number] Date of Birth: 11-Apr-1970  Transition of Care [facility]) CM/SW Contact:  Candie Chroman, LCSW Phone Number: 12/23/2019, 4:38 PM   Clinical Narrative: Patient has orders to discharge to [facility] in [place] today. RN will call report. [facility] set up door-to-door transportation with [employer]. They will be here at 4:55 pm to pick him up. Patient walking 200 feet min guard. Does not need wheelchair van. EMS, [facility], and [facility] unable to transport. No further concerns. CSW signing off.  Final next level of care: IP Rehab Facility Barriers to Discharge: Barriers Resolved   Patient Goals and CMS Choice Patient states their goals for this hospitalization and ongoing recovery are:: Return home with [facility]   Choice offered to / list presented to : Patient  Discharge Placement              Patient chooses bed at: Other - please specify in the comment section below: [facility] in [place]) Patient to be transferred to facility by: [place] - Door to door transport set up through [employer]   Patient and family notified of of transfer: 12/23/19  Discharge Plan and Services In-house Referral: Clinical Social Work Discharge Planning Services: [employer] Consult Post Acute Care Choice: Home Health          DME Arranged: N/A DME Agency: NA         [place] Agency: [place] ([facility])        Social Determinants of Health ([place]) Interventions     Readmission Risk Interventions Readmission Risk Prevention Plan 12/06/2019 11/24/2019  Transportation Screening Complete Complete  PCP or Specialist Appt within 3-5 Days - Complete  HRI or [place] Complete Complete  Social Work Consult for [place] Planning/Counseling Complete Complete  Palliative Care Screening Not Applicable Not Applicable   Medication Review Press photographer) - Referral to Pharmacy  Some recent data might be hidden

## 2019-12-23 NOTE — Treatment Plan (Addendum)
This Probation officer has attempted to see patient at 46 and [phone]. MD Zhang at bedside. Pt has stated for this writer to come back later he dont want to be bothered. Will continue to care for patient as he is not wanting to be bothered.   0845: patient refuses vitals at this time. MD Zhang aware.

## 2019-12-23 NOTE — Progress Notes (Signed)
   12/22/19 1315 12/22/19 1330 12/22/19 1345  Vitals  BP (!) 92/55 (!) 96/48 (!) 92/52  MAP (mmHg) 67 (!) 62 66  Pulse Rate 73 60 67  ECG Heart Rate 65 72 76  Resp 11 10 11   During Hemodialysis Assessment  Blood Flow Rate (mL/min) 400 mL/min 400 mL/min 400 mL/min  Arterial Pressure (mmHg) -150 mmHg -150 mmHg -150 mmHg  Venous Pressure (mmHg) 140 mmHg 140 mmHg 140 mmHg  Transmembrane Pressure (mmHg) 80 mmHg 80 mmHg 80 mmHg  Ultrafiltration Rate (mL/min) 1000 mL/min 1000 mL/min 1000 mL/min  Dialysate Flow Rate (mL/min) 600 ml/min 600 ml/min 600 ml/min  Conductivity: Machine  14.1 14.1 14.1  HD Safety Checks Performed Yes Yes Yes  Intra-Hemodialysis Comments Progressing as prescribed Progressing as prescribed Progressing as prescribed    12/22/19 1400 12/22/19 1415 12/22/19 1430  Vitals  BP (!) 82/43 (!) 100/59 (!) 79/54  MAP (mmHg) (!) 57 71 (!) 63  Pulse Rate 78 (!) 59 61  ECG Heart Rate 73 (!) 56 (!) 53  Resp 13 12 10   During Hemodialysis Assessment  Blood Flow Rate (mL/min) 400 mL/min 400 mL/min  --   Arterial Pressure (mmHg) -150 mmHg -150 mmHg  --   Venous Pressure (mmHg) 140 mmHg 140 mmHg  --   Transmembrane Pressure (mmHg) 80 mmHg 80 mmHg  --   Ultrafiltration Rate (mL/min) 1000 mL/min 1000 mL/min  --   Dialysate Flow Rate (mL/min) 600 ml/min 600 ml/min  --   Conductivity: Machine  14.1 14.1  --   HD Safety Checks Performed Yes Yes  --   Intra-Hemodialysis Comments Progressing as prescribed Progressing as prescribed Tx completed    12/22/19 1445  Vitals  BP (!) 101/59  MAP (mmHg) 72  Pulse Rate 67  ECG Heart Rate 64  Resp 12  During Hemodialysis Assessment  Blood Flow Rate (mL/min)  --   Arterial Pressure (mmHg)  --   Venous Pressure (mmHg)  --   Transmembrane Pressure (mmHg)  --   Ultrafiltration Rate (mL/min)  --   Dialysate Flow Rate (mL/min)  --   Conductivity: Machine   --   HD Safety Checks Performed  --   Intra-Hemodialysis Comments  --

## 2019-12-23 NOTE — Progress Notes (Signed)
   12/22/19 1500 12/22/19 1627  Vitals  Temp  --  97.7 F (36.5 C)  Temp Source  --  Oral  BP  --  (!) 144/70  MAP (mmHg)  --  88  BP Location  --  Right Arm  BP Method  --  Automatic  Patient Position (if appropriate)  --  Lying  Pulse Rate 68 83  Pulse Rate Source  --  Monitor  ECG Heart Rate 70  --   Resp 17 20

## 2019-12-23 NOTE — Progress Notes (Signed)
OT Cancellation Note  Patient Details Name: [name] MRN: [id number] DOB: Nov 16, 1970   Cancelled Treatment:    Reason Eval/Treat Not Completed: Patient declined, no reason specified. OT attempting to pt for OT intervention but pt declines and verbalized feeling nauseated at this time. OT will re-attempt as time allows.  Darleen Crocker, [place], OTR/L , CBIS ascom [phone]  12/23/19, 10:58 AM    12/23/2019, 10:57 AM

## 2019-12-23 NOTE — Progress Notes (Addendum)
[facility]  ROUNDING NOTE   Subjective:    Patient resting in bed, in no acute distress.  Discharge planning in process.  We will plan to dialyze him before discharge, if he is getting discharged today.  Objective:  Vital signs in last 24 hours:  Temp:  [97.5 F (36.4 C)-97.8 F (36.6 C)] 97.5 F (36.4 C) (12/03 0438) Pulse Rate:  [59-86] 86 (12/03 1330) Resp:  [10-20] 10 (12/03 1345) BP: (79-144)/(43-75) 109/69 (12/03 1345) SpO2:  [98 %-100 %] 100 % (12/03 1330)  Weight change:  Filed Weights   12/05/19 1757 12/21/19 1107  Weight: 95.3 kg 95.5 kg    Intake/Output: I/O last 3 completed shifts: In: 81 [P.O.:1320] Out: 2500 [Other:2500]   Intake/Output this shift:  Total I/O In: 240 [P.O.:240] Out: -   Physical Exam: General:  Resting in bed, in no acute distress  Head:  Normocephalic, atraumatic  Eyes:  Anicteric  Lungs:   Lungs clear to auscultation bilaterally  Heart:  iregular  Abdomen:  Soft, non tender,non distended  Extremities:  Lower extremity edema 1+  Neurologic:  Awake, alert, oriented  Skin:  Right leg with dressing clean,dry and intact  Access: Left AVG      Basic Metabolic Panel: Recent Labs  Lab 12/17/19 1116 12/22/19 1118 12/23/19 0717  NA 137 138 137  K 4.2 5.2* 5.1  CL 96* 97* 99  CO2 31 27 28   GLUCOSE 112* 97 70  BUN 28* 50* 39*  CREATININE 4.21* 5.99* 4.83*  CALCIUM 9.1 9.1 9.4  PHOS 3.4 4.1  --     Liver Function Tests: Recent Labs  Lab 12/17/19 1116 12/22/19 1118  ALBUMIN 2.9* 3.4*   No results for input(s): LIPASE, AMYLASE in the last 168 hours. No results for input(s): AMMONIA in the last 168 hours.  CBC: Recent Labs  Lab 12/17/19 1116 12/17/19 1908 12/22/19 1118 12/23/19 0717  WBC 5.4  --  5.3 5.8  NEUTROABS 3.9  --   --  3.8  HGB 7.2* 8.2* 8.1* 8.7*  HCT 23.4* 26.6* 26.1* 28.0*  MCV 95.9  --  93.2 94.0  PLT 149*  --  130* 132*    Cardiac Enzymes: No results for input(s): CKTOTAL, CKMB,  CKMBINDEX, TROPONINI in the last 168 hours.  BNP: Invalid input(s): POCBNP  CBG: No results for input(s): GLUCAP in the last 168 hours.  Microbiology: Results for orders placed or performed during the hospital encounter of 12/05/19  Respiratory Panel by RT PCR (Flu A&B, Covid) - Nasopharyngeal Swab     Status: None   Collection Time: 12/05/19 10:04 PM   Specimen: Nasopharyngeal Swab  Result Value Ref Range Status   SARS Coronavirus 2 by RT PCR NEGATIVE NEGATIVE Final    Comment: (NOTE) SARS-CoV-2 target nucleic acids are NOT DETECTED.  The SARS-CoV-2 RNA is generally detectable in upper respiratoy specimens during the acute phase of infection. The lowest concentration of SARS-CoV-2 viral copies this assay can detect is 131 copies/mL. A negative result does not preclude SARS-Cov-2 infection and should not be used as the sole basis for treatment or other patient management decisions. A negative result may occur with  improper specimen collection/handling, submission of specimen other than nasopharyngeal swab, presence of viral mutation(s) within the areas targeted by this assay, and inadequate number of viral copies (<131 copies/mL). A negative result must be combined with clinical observations, patient history, and epidemiological information. The expected result is Negative.  Fact Sheet for Patients:  PinkCheek.be  Fact Sheet for Healthcare Providers:  GravelBags.it  This test is no t yet approved or cleared by the [place] FDA and  has been authorized for detection and/or diagnosis of SARS-CoV-2 by FDA under an Emergency Use Authorization (EUA). This EUA will remain  in effect (meaning this test can be used) for the duration of the COVID-19 declaration under Section 564(b)(1) of the Act, 21 U.S.C. section 360bbb-3(b)(1), unless the authorization is terminated or revoked sooner.     Influenza A by PCR NEGATIVE  NEGATIVE Final   Influenza B by PCR NEGATIVE NEGATIVE Final    Comment: (NOTE) The Xpert Xpress SARS-CoV-2/FLU/RSV assay is intended as an aid in  the diagnosis of influenza from Nasopharyngeal swab specimens and  should not be used as a sole basis for treatment. Nasal washings and  aspirates are unacceptable for Xpert Xpress SARS-CoV-2/FLU/RSV  testing.  Fact Sheet for Patients: PinkCheek.be  Fact Sheet for Healthcare Providers: GravelBags.it  This test is not yet approved or cleared by the [place] FDA and  has been authorized for detection and/or diagnosis of SARS-CoV-2 by  FDA under an Emergency Use Authorization (EUA). This EUA will remain  in effect (meaning this test can be used) for the duration of the  Covid-19 declaration under Section 564(b)(1) of the Act, 21  U.S.C. section 360bbb-3(b)(1), unless the authorization is  terminated or revoked. Performed at [facility], [address] [place], [place] [address]   Resp Panel by RT-PCR (Flu A&B, Covid) Nasopharyngeal Swab     Status: None   Collection Time: 12/16/19 12:03 PM   Specimen: Nasopharyngeal Swab; Nasopharyngeal(NP) swabs in vial transport medium  Result Value Ref Range Status   SARS Coronavirus 2 by RT PCR NEGATIVE NEGATIVE Final    Comment: (NOTE) SARS-CoV-2 target nucleic acids are NOT DETECTED.  The SARS-CoV-2 RNA is generally detectable in upper respiratory specimens during the acute phase of infection. The lowest concentration of SARS-CoV-2 viral copies this assay can detect is 138 copies/mL. A negative result does not preclude SARS-Cov-2 infection and should not be used as the sole basis for treatment or other patient management decisions. A negative result may occur with  improper specimen collection/handling, submission of specimen other than nasopharyngeal swab, presence of viral mutation(s) within the areas targeted by this  assay, and inadequate number of viral copies(<138 copies/mL). A negative result must be combined with clinical observations, patient history, and epidemiological information. The expected result is Negative.  Fact Sheet for Patients:  EntrepreneurPulse.com.au  Fact Sheet for Healthcare Providers:  IncredibleEmployment.be  This test is no t yet approved or cleared by the [place] FDA and  has been authorized for detection and/or diagnosis of SARS-CoV-2 by FDA under an Emergency Use Authorization (EUA). This EUA will remain  in effect (meaning this test can be used) for the duration of the COVID-19 declaration under Section 564(b)(1) of the Act, 21 U.S.C.section 360bbb-3(b)(1), unless the authorization is terminated  or revoked sooner.       Influenza A by PCR NEGATIVE NEGATIVE Final   Influenza B by PCR NEGATIVE NEGATIVE Final    Comment: (NOTE) The Xpert Xpress SARS-CoV-2/FLU/RSV plus assay is intended as an aid in the diagnosis of influenza from Nasopharyngeal swab specimens and should not be used as a sole basis for treatment. Nasal washings and aspirates are unacceptable for Xpert Xpress SARS-CoV-2/FLU/RSV testing.  Fact Sheet for Patients: EntrepreneurPulse.com.au  Fact Sheet for Healthcare Providers: IncredibleEmployment.be  This test is not yet approved or  cleared by the [place] and has been authorized for detection and/or diagnosis of SARS-CoV-2 by FDA under an Emergency Use Authorization (EUA). This EUA will remain in effect (meaning this test can be used) for the duration of the COVID-19 declaration under Section 564(b)(1) of the Act, 21 U.S.C. section 360bbb-3(b)(1), unless the authorization is terminated or revoked.  Performed at [facility], [address] [place], [place] [address]     Coagulation Studies: No results for input(s): LABPROT, INR in the last 72  hours.  Urinalysis: No results for input(s): COLORURINE, LABSPEC, PHURINE, GLUCOSEU, HGBUR, BILIRUBINUR, KETONESUR, PROTEINUR, UROBILINOGEN, NITRITE, LEUKOCYTESUR in the last 72 hours.  Invalid input(s): APPERANCEUR    Imaging: No results found.   Medications:   . albumin human 25 g (12/22/19 1157)   . aspirin EC  81 mg Oral Daily  . cephALEXin  500 mg Oral Q12H  . collagenase   Topical Daily  . epoetin (EPOGEN/PROCRIT) injection  10,000 Units Intravenous Q T,Th,Sa-HD  . feeding supplement (NEPRO CARB STEADY)  237 mL Oral BID BM  . ferrous sulfate  325 mg Oral BID  . heparin  5,000 Units Subcutaneous Q8H  . midodrine  10 mg Oral TID WC  . multivitamin  1 tablet Oral QHS  . pantoprazole  40 mg Oral BID  . predniSONE  10 mg Oral Daily  . sevelamer carbonate  800 mg Oral BID WC  . sodium bicarbonate  1,300 mg Oral BID  . tamsulosin  0.4 mg Oral Once per day on Sun Mon Wed Fri  . traZODone  50 mg Oral QHS   acetaminophen **OR** acetaminophen, calcium carbonate, loperamide, ondansetron **OR** ondansetron (ZOFRAN) IV, oxyCODONE, promethazine  Assessment/ Plan:  [name] is a 49 y.o. black male with end stage renal disease on hemodialysis, history of kidney transplant, lupus nephritis, and hypotension who was admitted to [facility] on 12/05/2019 for Hyperkalemia [E87.5] Cellulitis of right lower extremity [L03.115]  Left arm AVG/TTS  # End stage renal disease.  Patient resting in bed, in no acute distress He got dialyzed in chair yesterday and tolerated well Outpatient dialysis arrangements for [facility] clinic TTS at 11 AM, starting next Tuesday 12/27/2019 We will plan to dialyze him today before discharge  # Chronic hypotension Blood pressure readings at low normal range Refusing to take midodrine States it makes his BP go lower Requesting iv albumin with each treatment Discussed with patient that iv albumin is not available as outpatient  # LE Edema Patient  continues continues to have 1+ lower extremity edema Dialysis with 2.5 L of ultrafiltration achieved yesterday  #Anemia with chronic kidney disease.   Lab Results  Component Value Date   HGB 8.7 (L) 12/23/2019  Epogen with dialysis TTS  #Secondary Hyperparathyroidism Lab Results  Component Value Date   CALCIUM 9.4 12/23/2019   PHOS 4.1 12/22/2019  Patient is on sevelamer     LOS: 17 [name] 12/3/20211:57 PM

## 2019-12-23 NOTE — TOC Progression Note (Addendum)
Transition of Care [facility]) - Progression Note    Patient Details  Name: [name] MRN: [id number] Date of Birth: [birth year]  Transition of [facility]) CM/SW [place], LCSW Phone Number: 12/23/2019, 12:05 PM  Clinical Narrative:   HD center in [facility] has been confirmed. CSW confirmed that [facility] can transport him to [facility] late this afternoon once he gets out of HD. Left admissions coordinator Sharyn Lull a voicemail. Will ask MD for discharge paperwork once she calls back.  1:13 pm: [facility] can accept him today as long as he is stable for discharge after HD. Dr. Candiss Norse is aware and said he will be done with HD before 4:00. Discharge summary faxed to admissions coordinator.  3:09 pm: Transport via [employer] [facility] set up for 5:00. Will have patient sign waiver when he returns to his room.  Expected Discharge Plan: IP Rehab Facility Barriers to Discharge: Continued Medical Work up  Expected Discharge Plan and Services Expected Discharge Plan: [place] In-house Referral: Clinical Social Work Discharge Planning Services: CM Consult Post Acute Care Choice: [place] arrangements for the past 2 months: Single Family Home Expected Discharge Date: 12/17/19               DME Arranged: N/A DME Agency: NA         HH Agency: [facility]         Social Determinants of Health (SDOH) Interventions    Readmission Risk Interventions Readmission Risk Prevention Plan 12/06/2019 11/24/2019  Transportation Screening Complete Complete  PCP or Specialist Appt within 3-5 Days - Complete  HRI or [place] Complete Complete  Social Work Consult for [place] Planning/Counseling Complete Complete  Palliative Care Screening Not Applicable Not Applicable  Medication Review Press photographer) - Referral to Pharmacy  Some recent data might be hidden

## 2019-12-23 NOTE — Progress Notes (Signed)
   12/22/19 1115 12/22/19 1130 12/22/19 1145  Vitals  Temp 98.2 F (36.8 C)  --   --   Temp Source Oral  --   --   BP 117/62 105/69 107/65  MAP (mmHg) 78 80 78  Pulse Rate 79 84  --   ECG Heart Rate 79 64 74  Resp 20 18 18   During Hemodialysis Assessment  Blood Flow Rate (mL/min)  --  400 mL/min 400 mL/min  Arterial Pressure (mmHg)  --  -150 mmHg -150 mmHg  Venous Pressure (mmHg)  --  130 mmHg 140 mmHg  Transmembrane Pressure (mmHg)  --  80 mmHg 80 mmHg  Ultrafiltration Rate (mL/min)  --  1000 mL/min 1000 mL/min  Dialysate Flow Rate (mL/min)  --  600 ml/min 600 ml/min  Conductivity: Machine   --  14.1 14.1  HD Safety Checks Performed  --  Yes Yes  Dialysis Fluid Bolus  --  Normal Saline  --   Intra-Hemodialysis Comments  --  Tx initiated Progressing as prescribed    12/22/19 1200 12/22/19 1215 12/22/19 1230  Vitals  Temp  --   --   --   Temp Source  --   --   --   BP 103/66 104/62 (!) 100/57  MAP (mmHg) 76 74 69  Pulse Rate 82 85 82  ECG Heart Rate 75 85 66  Resp 15 17 12   During Hemodialysis Assessment  Blood Flow Rate (mL/min) 400 mL/min 400 mL/min 400 mL/min  Arterial Pressure (mmHg) -150 mmHg -150 mmHg -150 mmHg  Venous Pressure (mmHg) 140 mmHg 140 mmHg 140 mmHg  Transmembrane Pressure (mmHg) 80 mmHg 80 mmHg 80 mmHg  Ultrafiltration Rate (mL/min) 1000 mL/min 1000 mL/min 1000 mL/min  Dialysate Flow Rate (mL/min) 600 ml/min 600 ml/min 600 ml/min  Conductivity: Machine  14.1 14.1 14.1  HD Safety Checks Performed Yes Yes Yes  Dialysis Fluid Bolus  --   --   --   Intra-Hemodialysis Comments Progressing as prescribed Progressing as prescribed Progressing as prescribed    12/22/19 1245 12/22/19 1300  Vitals  Temp  --   --   Temp Source  --   --   BP (!) 94/56 (!) 91/55  MAP (mmHg) 67 66  Pulse Rate 80 72  ECG Heart Rate 78 71  Resp 13 12  During Hemodialysis Assessment  Blood Flow Rate (mL/min) 400 mL/min 400 mL/min  Arterial Pressure (mmHg) -150 mmHg -150 mmHg   Venous Pressure (mmHg) 140 mmHg 140 mmHg  Transmembrane Pressure (mmHg) 80 mmHg 80 mmHg  Ultrafiltration Rate (mL/min) 1000 mL/min 1000 mL/min  Dialysate Flow Rate (mL/min) 600 ml/min 600 ml/min  Conductivity: Machine  14.1 14.1  HD Safety Checks Performed Yes Yes  Dialysis Fluid Bolus  --   --   Intra-Hemodialysis Comments Progressing as prescribed Progressing as prescribed

## 2019-12-23 NOTE — Discharge Summary (Addendum)
Physician Discharge Summary  Patient ID: [name] MRN: [id number] DOB/AGE: April 02, 1970 49 y.o.  Admit date: 12/05/2019 Discharge date: 12/23/2019   Patient will need a Covid testing 72 hours from the next hemodialysis.  Admission Diagnoses:  Discharge Diagnoses:  Principal Problem:   Hyperkalemia Active Problems:   Cellulitis of right lower extremity   ESRD on hemodialysis (HCC)   Systemic lupus erythematosus, unspecified ([place])   History of Renal transplant failure   Pseudomonas aeruginosa wound infection   Discharged Condition: good  Hospital Course:  49 year old male with past medical history significant for ESRD secondary to SLE, failed renal transplanton chronic prednisone, now on hemodialysis TTS, recently hospitalized from 11/17/2019-12/05/2019 for septic shock secondary to right lower extremity cellulitis with wound culture growing Pseudomonas and on the4/5thday of meropenem, he signed out AMA and returned to the ED the same day because he was unable to get outpatient dialysis after leaving the hospital as he did not have a recent Covid test which is the protocol. His case was communicated with ID andhecompleted antibiotics on 12/09/2019.Improved and stable. Medically stable for DC. Repeat Covid test 11/26: Negative. Inpatient rehab refused to take patient on 11/27. TOC team working him up again for potential DC to inpatient rehab including insurance preauthorization. Prior to admission, patient lived alone, and stated that he was independent and did not require any assistance to ambulate. Currently deconditioned from multiple acute on chronic medical conditions. He is slowly recovering from this. He is very motivated to proceed with intense rehab so that he can return to his prior level of independent functioning and eventually home. Thereby inpatient rehab and not SNF is the most appropriate discharge disposition for this patient at this time. He is medically  stable for discharge at this time.   #1.  End-stage renal disease on dialysis with a failed renal transplant. Hyperkalemia SLE. Patient is stable on dialysis.  Nephrology has arranged for further dialysis in the rehab unit.  2.  Pseudomonas wound infection. Cellulitis of the right lower extremity. Right lower extremity wound. Completed antibiotics.  Condition stable.  Continue follow-up with wound care.  3.  Anemia of chronic kidney disease. Thrombocytopenia. Condition stable.  4.  Soft tissue infection at the buttock. Will need one more day of Keflex.  Condition improved   Consults: nephrology  Significant Diagnostic Studies:     Treatments: HD, antibiotics  Discharge Exam: Blood pressure 117/73, pulse 79, temperature (!) 97.5 F (36.4 C), temperature source Oral, resp. rate 16, height 6\' 2"  (1.88 m), weight 95.5 kg, SpO2 98 %. General appearance: alert and cooperative Resp: clear to auscultation bilaterally Cardio: regular rate and rhythm, S1, S2 normal, no murmur, click, rub or gallop GI: soft, non-tender; bowel sounds normal; no masses,  no organomegaly Extremities: venous stasis dermatitis noted  Disposition: Discharge disposition: 90-DC/txfr to inpt rehab facility with planned acute care hosp IP admission       Discharge Instructions    Ambulatory referral to Podiatry   Complete by: As directed    Call MD for:  difficulty breathing, headache or visual disturbances   Complete by: As directed    Call MD for:  extreme fatigue   Complete by: As directed    Call MD for:  persistant dizziness or light-headedness   Complete by: As directed    Call MD for:  persistant nausea and vomiting   Complete by: As directed    Call MD for:  redness, tenderness, or signs of infection (pain, swelling, redness,  odor or green/yellow discharge around incision site)   Complete by: As directed    Call MD for:  severe uncontrolled pain   Complete by: As directed    Call MD  for:  temperature >100.4   Complete by: As directed    Diet - low sodium heart healthy   Complete by: As directed    Diet - low sodium heart healthy   Complete by: As directed    Discharge wound care:   Complete by: As directed    Apply Santyl to right leg wound Q day, then cover with moist gauze and foam dressing.  (Change foam dressing Q 3 days or PRN soiling.)   Discharge wound care:   Complete by: As directed    For wound care order above.   Increase activity slowly   Complete by: As directed    Increase activity slowly   Complete by: As directed      Allergies as of 12/23/2019      Reactions   Ciprofloxacin Rash   skin on hands peeled off "in sheets."   Sulfa Antibiotics Rash      Medication List    STOP taking these medications   epoetin alfa 10000 UNIT/ML injection Commonly known as: EPOGEN     TAKE these medications   aspirin EC 81 MG tablet Take 81 mg by mouth daily.   cephALEXin 500 MG capsule Commonly known as: KEFLEX Take 1 capsule (500 mg total) by mouth every 12 (twelve) hours for 4 days. Discontinue after 12/24/2019 doses.   collagenase ointment Commonly known as: SANTYL Apply topically daily. Apply Santyl to right leg wound Q day, then cover with moist gauze and foam dressing.  (Change foam dressing Q 3 days or PRN soiling.)   feeding supplement (NEPRO CARB STEADY) Liqd Take 237 mLs by mouth 2 (two) times daily between meals.   ferrous sulfate 324 (65 Fe) MG Tbec Take 1 tablet (325 mg total) by mouth 2 (two) times daily.   HYDROcodone-acetaminophen 10-325 MG tablet Commonly known as: NORCO Take 1 tablet by mouth every 8 (eight) hours as needed for moderate pain or severe pain. Do Not Fill Before 11/21/2019 What changed: reasons to take this   loperamide 2 MG capsule Commonly known as: IMODIUM Take 1 capsule (2 mg total) by mouth every 6 (six) hours as needed for diarrhea or loose stools. For 3 to 5 days, then stop   midodrine 10 MG  tablet Commonly known as: PROAMATINE Take 1 tablet (10 mg total) by mouth 3 (three) times daily with meals. What changed:   medication strength  how much to take  when to take this   multivitamin Tabs tablet Take 1 tablet by mouth at bedtime.   pantoprazole 40 MG tablet Commonly known as: Protonix Take 1 tablet (40 mg total) by mouth 2 (two) times daily.   predniSONE 10 MG tablet Commonly known as: DELTASONE Take 10 mg by mouth daily.   promethazine 25 MG tablet Commonly known as: PHENERGAN Take 25 mg by mouth every 6 (six) hours as needed for nausea or vomiting.   sevelamer carbonate 800 MG tablet Commonly known as: RENVELA Take 1 tablet (800 mg total) by mouth 2 (two) times daily with a meal.   tamsulosin 0.4 MG Caps capsule Commonly known as: FLOMAX Once per day on Sun Mon Wed Fri (i.e. nonhemodialysis days). What changed:   how much to take  how to take this  when to take this  reasons  to take this  additional instructions   traZODone 50 MG tablet Commonly known as: DESYREL Take 50 mg by mouth at bedtime.            Discharge Care Instructions  (From admission, onward)         Start     Ordered   12/23/19 0000  Discharge wound care:       Comments: For wound care order above.   12/23/19 1241   12/17/19 0000  Discharge wound care:       Comments: Apply Santyl to right leg wound Q day, then cover with moist gauze and foam dressing.  (Change foam dressing Q 3 days or PRN soiling.)   12/17/19 1123          Follow-up Information    MD at SNF. Schedule an appointment as soon as possible for a visit.   Why: To be seen in 2 to 3 days with repeat labs (CBC & renal panel).  Recommend wound care consultation and follow-up of right leg chronic wound.       Hemodialysis center Follow up on 12/22/2019.   Why: Continue scheduled hemodialysis on Tuesdays, Thursdays and Saturdays.       [place] Schedule an appointment as soon as possible  for a visit.   Specialty: General Practice Why: Upon discharge from SNF. Contact information: [place] [address] [phone]               Signed: Sharen Hones 12/23/2019, 12:42 PM

## 2020-01-05 ENCOUNTER — Encounter
Payer: No Typology Code available for payment source | Attending: Physical Medicine & Rehabilitation | Admitting: Registered Nurse

## 2020-01-05 ENCOUNTER — Other Ambulatory Visit: Payer: Self-pay

## 2020-01-05 ENCOUNTER — Encounter: Payer: Self-pay | Admitting: Registered Nurse

## 2020-01-05 VITALS — BP 108/42 | HR 65 | Temp 97.4°F | Ht 74.0 in | Wt 210.0 lb

## 2020-01-05 DIAGNOSIS — M25561 Pain in right knee: Secondary | ICD-10-CM | POA: Diagnosis present

## 2020-01-05 DIAGNOSIS — M7061 Trochanteric bursitis, right hip: Secondary | ICD-10-CM

## 2020-01-05 DIAGNOSIS — Z79891 Long term (current) use of opiate analgesic: Secondary | ICD-10-CM | POA: Diagnosis present

## 2020-01-05 DIAGNOSIS — G894 Chronic pain syndrome: Secondary | ICD-10-CM

## 2020-01-05 DIAGNOSIS — M25562 Pain in left knee: Secondary | ICD-10-CM | POA: Diagnosis present

## 2020-01-05 DIAGNOSIS — M545 Low back pain, unspecified: Secondary | ICD-10-CM

## 2020-01-05 DIAGNOSIS — Z5181 Encounter for therapeutic drug level monitoring: Secondary | ICD-10-CM | POA: Diagnosis present

## 2020-01-05 DIAGNOSIS — M79671 Pain in right foot: Secondary | ICD-10-CM | POA: Diagnosis present

## 2020-01-05 DIAGNOSIS — M79672 Pain in left foot: Secondary | ICD-10-CM | POA: Diagnosis present

## 2020-01-05 DIAGNOSIS — L930 Discoid lupus erythematosus: Secondary | ICD-10-CM

## 2020-01-05 DIAGNOSIS — M7062 Trochanteric bursitis, left hip: Secondary | ICD-10-CM

## 2020-01-05 DIAGNOSIS — M2550 Pain in unspecified joint: Secondary | ICD-10-CM

## 2020-01-05 DIAGNOSIS — G8929 Other chronic pain: Secondary | ICD-10-CM | POA: Diagnosis present

## 2020-01-05 NOTE — Progress Notes (Signed)
Subjective:    Patient ID: [name], male    DOB: Jun 16, 1970, 49 y.o.   MRN: [id number]  HPI: [name] is a 49 y.o. male who returns for follow up appointment for chronic pain and medication refill. He states his pain is located in his lower back, bilateral hips, bilateral knees and bilateral feet. He also reports generalize joint pain. He rates his pain 8. His current exercise regime is walking with his cane.   [name] was admitted to [facility] on 11/17/2019 and discharged on 11/15/2021for septic shock secondary to right lower extremity cellulitis. Discharge Summary was reviewed.   [name] was admitted to [facility] on 12/05/2019 and discharged home on 12/23/2019 for Hyperkalemia. He was discharged to inpatient rehabilitation. [name] reports he was discharged from inpatient rehabilitation on 01/04/2020.    [name] Morphine equivalent is  30.00MME.  Last Oral Swab was Performed on 07/19/2019, it was consistent.    Pain Inventory Average Pain 5 Pain Right Now 8 My pain is sharp, dull and aching  In the last 24 hours, has pain interfered with the following? General activity 5 Relation with others 1 Enjoyment of life 6 What TIME of day is your pain at its worst? morning  Sleep (in general) Fair  Pain is worse with: walking and standing Pain improves with: rest and medication Relief from Meds: 5  Family History  Problem Relation Age of Onset  . Hypertension Other   . Brain cancer Mother        Died at age 22  . Aneurysm Mother   . CAD Father   . Heart attack Father 64   Social History   Socioeconomic History  . Marital status: Divorced    Spouse name: Not on file  . Number of children: 2  . Years of education: Not on file  . Highest education level: Not on file  Occupational History  . Not on file  Tobacco Use  . Smoking status: Former Smoker    Packs/day: 0.50    Years: 13.00    Pack years: 6.50    Types: Cigarettes  .  Smokeless tobacco: Never Used  Vaping Use  . Vaping Use: Never used  Substance and Sexual Activity  . Alcohol use: Yes    Comment: Occasional  . Drug use: No  . Sexual activity: Not on file  Other Topics Concern  . Not on file  Social History Narrative  . Not on file   Social Determinants of Health   Financial Resource Strain: Not on file  Food Insecurity: Not on file  Transportation Needs: Not on file  Physical Activity: Not on file  Stress: Not on file  Social Connections: Not on file   Past Surgical History:  Procedure Laterality Date  . NEPHRECTOMY TRANSPLANTED ORGAN     Past Surgical History:  Procedure Laterality Date  . NEPHRECTOMY TRANSPLANTED ORGAN     Past Medical History:  Diagnosis Date  . Anemia   . Collagen vascular disease ([place])   . Lupus ([place])   . Renal disorder   . Renal insufficiency   . Renal transplant recipient    BP (!) 163/56   Pulse 65   Temp (!) 97.4 F (36.3 C)   Ht 6\' 2"  (1.88 m)   Wt 210 lb (95.3 kg)   SpO2 (!) 89% Comment: patient states it is difficult to get accurate reading due t  BMI 26.96 kg/m   Opioid Risk Score:  Fall Risk Score:  `1  Depression screen PHQ 2/9  Depression screen [facility] 2/9 11/15/2019 06/09/2019 05/03/2019 06/24/2018 04/27/2018  Decreased Interest 0 0 0 0 0  Down, Depressed, Hopeless 0 0 0 0 0  PHQ - 2 Score 0 0 0 0 0    Review of Systems  Constitutional: Negative.   HENT: Negative.   Eyes: Negative.   Respiratory: Negative.   Cardiovascular: Negative.   Gastrointestinal: Negative.   Endocrine: Negative.   Genitourinary: Negative.   Musculoskeletal: Positive for arthralgias, back pain and gait problem.  Allergic/Immunologic: Negative.   Hematological: Negative.   Psychiatric/Behavioral: Negative.   All other systems reviewed and are negative.      Objective:   Physical Exam Vitals and nursing note reviewed.  Constitutional:      Appearance: He is ill-appearing.  Cardiovascular:     Rate and  Rhythm: Normal rate and regular rhythm.     Pulses: Normal pulses.     Heart sounds: Normal heart sounds.  Pulmonary:     Effort: Pulmonary effort is normal.     Breath sounds: Normal breath sounds.  Musculoskeletal:     Cervical back: Normal range of motion and neck supple.     Right lower leg: Edema present.     Left lower leg: Edema present.     Comments: Normal Muscle Bulk and Muscle Testing Reveals:  Upper Extremities: Full ROM and Muscle Strength 5/5 Right Greater Trochanter Tenderness Lower Extremities: Full ROM and Muscle Strength 5/5 Arises from Table slowly using cane for support Antalgic Gait   Skin:    General: Skin is warm and dry.  Neurological:     Mental Status: He is alert and oriented to person, place, and time.  Psychiatric:        Mood and Affect: Mood normal.        Behavior: Behavior normal.           Assessment & Plan:  1. Systemic Lupus Multiple Joint Involvement/ ESRD: Continue HEP as Tolerated. 01/05/2020 Rheumatology and Nephrology Following.  2. Tendonitis of both rotator cuffs: No complaints today.Continue HEP as Tolerated. Continue to Monitor.01/05/2020 3. Chronic Pain Syndrome: Continue  Hydrocodone 10/325 mg one table every 8 hours as needed for pain. #90.Continue Voltaren Gel.11/15/2019. We will continue the opioid monitoring program, this consists of regular clinic visits, examinations, urine drug screen, pill counts as well as use of [place] Controlled Substance Reporting system. A 12 month History has been reviewed on the [place] Controlled Substance Reporting Systemon 01/05/2020. 4. Polyarthralgia: Continue to Monitor.01/05/2020 5.Greater Trochanter Bursitis:Continue to Alternate Ice and Heat Therapy. Continue to Monitor.01/05/2020 6. Chronic Bilateral Knee Pain:Continue HEP as Tolerated and Continue to Monitor.01/05/2020 7.Bilateral feet Pain: Continue HEP as Tolerated. Continue to Monitor.01/05/2020. 8. Chronic  Bilateral Thoracic Pain:No complaints today.Continue current medication regimen. Continue to monitor.01/05/2020 9. Chronic Left Shoulder Pain:No Complaints today.Continue HEP as Tolerated. Continue to Monitor.01/05/2020.

## 2020-01-31 ENCOUNTER — Other Ambulatory Visit: Payer: Self-pay

## 2020-01-31 ENCOUNTER — Encounter
Payer: No Typology Code available for payment source | Attending: Physical Medicine & Rehabilitation | Admitting: Registered Nurse

## 2020-01-31 ENCOUNTER — Encounter: Payer: Self-pay | Admitting: Registered Nurse

## 2020-01-31 VITALS — BP 139/67 | HR 80 | Temp 98.0°F | Ht 74.0 in | Wt 204.4 lb

## 2020-01-31 DIAGNOSIS — M2550 Pain in unspecified joint: Secondary | ICD-10-CM | POA: Insufficient documentation

## 2020-01-31 DIAGNOSIS — Z79891 Long term (current) use of opiate analgesic: Secondary | ICD-10-CM | POA: Diagnosis not present

## 2020-01-31 DIAGNOSIS — M25561 Pain in right knee: Secondary | ICD-10-CM | POA: Insufficient documentation

## 2020-01-31 DIAGNOSIS — M25562 Pain in left knee: Secondary | ICD-10-CM | POA: Diagnosis present

## 2020-01-31 DIAGNOSIS — M79672 Pain in left foot: Secondary | ICD-10-CM | POA: Diagnosis present

## 2020-01-31 DIAGNOSIS — L930 Discoid lupus erythematosus: Secondary | ICD-10-CM | POA: Insufficient documentation

## 2020-01-31 DIAGNOSIS — M545 Low back pain, unspecified: Secondary | ICD-10-CM | POA: Insufficient documentation

## 2020-01-31 DIAGNOSIS — G8929 Other chronic pain: Secondary | ICD-10-CM | POA: Diagnosis present

## 2020-01-31 DIAGNOSIS — G894 Chronic pain syndrome: Secondary | ICD-10-CM | POA: Diagnosis not present

## 2020-01-31 DIAGNOSIS — Z5181 Encounter for therapeutic drug level monitoring: Secondary | ICD-10-CM | POA: Diagnosis not present

## 2020-01-31 DIAGNOSIS — M79671 Pain in right foot: Secondary | ICD-10-CM | POA: Diagnosis present

## 2020-01-31 MED ORDER — HYDROCODONE-ACETAMINOPHEN 10-325 MG PO TABS: 1.0000 | ORAL_TABLET | Freq: Three times a day (TID) | ORAL | 0 refills | Status: DC | PRN

## 2020-01-31 NOTE — Progress Notes (Signed)
Subjective:    Patient ID: [name], male    DOB: 1970-11-22, 50 y.o.   MRN: [id number]  HPI: [name] is a 50 y.o. male who returns for follow up appointment for chronic pain and medication refill. He states his pain is located in his lower back, bilateral knees and bilateral feet pain. Also reports generalized joint pain. He rates his pain 5. His  current exercise regime is walking and performing stretching exercises. Also reports generalized joint pain.   Mr. Marrs Morphine equivalent is 30.00 MME.   Oral Swab was Performed Today.    Pain Inventory Average Pain 6 Pain Right Now 5 My pain is sharp, stabbing and aching  In the last 24 hours, has pain interfered with the following? General activity 3 Relation with others 1 Enjoyment of life 3 What TIME of day is your pain at its worst? morning  Sleep (in general) Fair  Pain is worse with: walking and standing Pain improves with: rest and medication Relief from Meds: 5  Family History  Problem Relation Age of Onset  . Hypertension Other   . Brain cancer Mother        Died at age 49  . Aneurysm Mother   . CAD Father   . Heart attack Father 45   Social History   Socioeconomic History  . Marital status: Divorced    Spouse name: Not on file  . Number of children: 2  . Years of education: Not on file  . Highest education level: Not on file  Occupational History  . Not on file  Tobacco Use  . Smoking status: Former Smoker    Packs/day: 0.50    Years: 13.00    Pack years: 6.50    Types: Cigarettes  . Smokeless tobacco: Never Used  Vaping Use  . Vaping Use: Never used  Substance and Sexual Activity  . Alcohol use: Yes    Comment: Occasional  . Drug use: No  . Sexual activity: Not on file  Other Topics Concern  . Not on file  Social History Narrative  . Not on file   Social Determinants of Health   Financial Resource Strain: Not on file  Food Insecurity: Not on file  Transportation Needs: Not  on file  Physical Activity: Not on file  Stress: Not on file  Social Connections: Not on file   Past Surgical History:  Procedure Laterality Date  . NEPHRECTOMY TRANSPLANTED ORGAN     Past Surgical History:  Procedure Laterality Date  . NEPHRECTOMY TRANSPLANTED ORGAN     Past Medical History:  Diagnosis Date  . Anemia   . Collagen vascular disease ([place])   . Lupus ([place])   . Renal disorder   . Renal insufficiency   . Renal transplant recipient    BP 139/67   Pulse 80   Temp 98 F (36.7 C)   Ht 6\' 2"  (1.88 m)   Wt 204 lb 6.4 oz (92.7 kg)   BMI 26.24 kg/m   Opioid Risk Score:   Fall Risk Score:  `1  Depression screen PHQ 2/9  Depression screen [facility] 2/9 11/15/2019 06/09/2019 05/03/2019 06/24/2018 04/27/2018  Decreased Interest 0 0 0 0 0  Down, Depressed, Hopeless 0 0 0 0 0  PHQ - 2 Score 0 0 0 0 0    Review of Systems  Musculoskeletal: Positive for back pain.       Knee pain Feet pain  All other systems reviewed and are  negative.      Objective:   Physical Exam Vitals and nursing note reviewed.  Constitutional:      Appearance: Normal appearance.  Cardiovascular:     Rate and Rhythm: Normal rate and regular rhythm.     Pulses: Normal pulses.     Heart sounds: Normal heart sounds.  Pulmonary:     Effort: Pulmonary effort is normal.     Breath sounds: Normal breath sounds.  Musculoskeletal:     Cervical back: Normal range of motion and neck supple.     Comments: Normal Muscle Bulk and Muscle Testing Reveals:  Upper Extremities: Full ROM and Muscle Strength 5/5 Lower Extremities: Decreased ROM  and Muscle Strength 5/5 Anasarca noted to Bilateral Lower Extremities Narrow Based Gait   Skin:    General: Skin is warm and dry.  Neurological:     Mental Status: He is alert and oriented to person, place, and time.  Psychiatric:        Mood and Affect: Mood normal.        Behavior: Behavior normal.           Assessment & Plan:  1. Systemic Lupus Multiple  Joint Involvement/ ESRD: Continue HEP as Tolerated. 01/31/2020 Rheumatology and Nephrology Following.  2. Tendonitis of both rotator cuffs: No complaints today.Continue HEP as Tolerated. Continue to Monitor.01/31/2020 3. Chronic Pain Syndrome: Continue  Hydrocodone 10/325 mg one table every 8 hours as needed for pain. #90.Continue Voltaren Gel.02/11/2020. We will continue the opioid monitoring program, this consists of regular clinic visits, examinations, urine drug screen, pill counts as well as use of [place] Controlled Substance Reporting system. A 12 month History has been reviewed on the [place] Controlled Substance Reporting Systemon01/11/2020 4. Polyarthralgia: Continue to Monitor.01/31/2020 5.Greater Trochanter Bursitis:No Complaints Today. Continue to Alternate Ice and Heat Therapy. Continue to Monitor.01/31/2020 6. Chronic Bilateral Knee Pain:Continue HEP as Tolerated and Continue to Monitor.01/31/2020 7.Bilateral feet Pain: Continue HEP as Tolerated. Continue to Monitor.01/31/2020. 8. Chronic Bilateral Thoracic Pain:No complaints today.Continue current medication regimen. Continue to monitor.01/31/2020 9. Chronic Left Shoulder Pain:No Complaints today.Continue HEP as Tolerated. Continue to Monitor.01/31/2020.  F/U in 1 month

## 2020-02-01 ENCOUNTER — Ambulatory Visit: Payer: No Typology Code available for payment source | Admitting: Registered Nurse

## 2020-02-04 LAB — DRUG TOX MONITOR 1 W/CONF, ORAL FLD
Amphetamines: NEGATIVE ng/mL (ref <10)
Barbiturates: NEGATIVE ng/mL (ref <10)
Benzodiazepines: NEGATIVE ng/mL (ref <0.50)
Buprenorphine: NEGATIVE ng/mL (ref <0.10)
Cocaine: NEGATIVE ng/mL (ref <5.0)
Codeine: NEGATIVE ng/mL (ref <2.5)
Cotinine: 5.7 ng/mL — ABNORMAL HIGH (ref <5.0)
Dihydrocodeine: 7.1 ng/mL — ABNORMAL HIGH (ref <2.5)
Fentanyl: NEGATIVE ng/mL (ref <0.10)
Heroin Metabolite: NEGATIVE ng/mL (ref <1.0)
Hydrocodone: 66.6 ng/mL — ABNORMAL HIGH (ref <2.5)
Hydromorphone: NEGATIVE ng/mL (ref <2.5)
MARIJUANA: NEGATIVE ng/mL (ref <2.5)
MDMA: NEGATIVE ng/mL (ref <10)
Meprobamate: NEGATIVE ng/mL (ref <2.5)
Methadone: NEGATIVE ng/mL (ref <5.0)
Morphine: NEGATIVE ng/mL (ref <2.5)
Nicotine Metabolite: POSITIVE ng/mL — AB (ref <5.0)
Norhydrocodone: 6.3 ng/mL — ABNORMAL HIGH (ref <2.5)
Noroxycodone: NEGATIVE ng/mL (ref <2.5)
Opiates: POSITIVE ng/mL — AB (ref <2.5)
Oxycodone: NEGATIVE ng/mL (ref <2.5)
Oxymorphone: NEGATIVE ng/mL (ref <2.5)
Phencyclidine: NEGATIVE ng/mL (ref <10)
Tapentadol: NEGATIVE ng/mL (ref <5.0)
Tramadol: NEGATIVE ng/mL (ref <5.0)
Zolpidem: NEGATIVE ng/mL (ref <5.0)

## 2020-02-04 LAB — DRUG TOX ALC METAB W/CON, ORAL FLD: Alcohol Metabolite: NEGATIVE ng/mL (ref <25)

## 2020-02-13 ENCOUNTER — Telehealth: Payer: Self-pay | Admitting: *Deleted

## 2020-02-13 NOTE — Telephone Encounter (Signed)
Oral swab drug screen was consistent for prescribed medications.  ?

## 2020-03-01 ENCOUNTER — Encounter: Payer: Self-pay | Admitting: Registered Nurse

## 2020-03-01 ENCOUNTER — Other Ambulatory Visit: Payer: Self-pay

## 2020-03-01 ENCOUNTER — Encounter
Payer: No Typology Code available for payment source | Attending: Physical Medicine & Rehabilitation | Admitting: Registered Nurse

## 2020-03-01 VITALS — BP 111/50 | HR 60 | Temp 98.4°F | Ht 74.0 in | Wt 197.0 lb

## 2020-03-01 DIAGNOSIS — M7061 Trochanteric bursitis, right hip: Secondary | ICD-10-CM | POA: Diagnosis present

## 2020-03-01 DIAGNOSIS — G8929 Other chronic pain: Secondary | ICD-10-CM

## 2020-03-01 DIAGNOSIS — G894 Chronic pain syndrome: Secondary | ICD-10-CM

## 2020-03-01 DIAGNOSIS — M25561 Pain in right knee: Secondary | ICD-10-CM | POA: Diagnosis not present

## 2020-03-01 DIAGNOSIS — L930 Discoid lupus erythematosus: Secondary | ICD-10-CM | POA: Diagnosis present

## 2020-03-01 DIAGNOSIS — Z5181 Encounter for therapeutic drug level monitoring: Secondary | ICD-10-CM

## 2020-03-01 DIAGNOSIS — M79672 Pain in left foot: Secondary | ICD-10-CM | POA: Diagnosis present

## 2020-03-01 DIAGNOSIS — M79671 Pain in right foot: Secondary | ICD-10-CM | POA: Insufficient documentation

## 2020-03-01 DIAGNOSIS — M25562 Pain in left knee: Secondary | ICD-10-CM | POA: Diagnosis present

## 2020-03-01 DIAGNOSIS — M7062 Trochanteric bursitis, left hip: Secondary | ICD-10-CM | POA: Insufficient documentation

## 2020-03-01 DIAGNOSIS — M2550 Pain in unspecified joint: Secondary | ICD-10-CM

## 2020-03-01 DIAGNOSIS — M545 Low back pain, unspecified: Secondary | ICD-10-CM | POA: Diagnosis not present

## 2020-03-01 DIAGNOSIS — Z79891 Long term (current) use of opiate analgesic: Secondary | ICD-10-CM

## 2020-03-01 MED ORDER — HYDROCODONE-ACETAMINOPHEN 10-325 MG PO TABS: 1.0000 | ORAL_TABLET | Freq: Three times a day (TID) | ORAL | 0 refills | Status: DC | PRN

## 2020-03-01 NOTE — Progress Notes (Signed)
Subjective:    Patient ID: [name], male    DOB: August 21, 1970, 50 y.o.   MRN: [id number]  HPI: [name] is a 50 y.o. male who returns for follow up appointment for chronic pain and medication refill. He states his  pain is located in his lower back, bilateral hips, bilateral knees and bilateral feet. He  rates his pain 7. His current exercise regime is walking.   [name] noted with pitting edema on right nipple line and ascites noted. He denies SOB or chest pain.  He was instructed to F/U with his PCP and nephrologist, he verbalizes understanding. Instructed to call office with an update, he verbalizes understanding.   [name] Morphine equivalent is 30.00 MME.    Oral Swab was Performed on 01/31/2020, it was consistent.    Pain Inventory Average Pain 5 Pain Right Now 7 My pain is sharp and stabbing  In the last 24 hours, has pain interfered with the following? General activity 2 Relation with others 0 Enjoyment of life 2 What TIME of day is your pain at its worst? morning  Sleep (in general) Poor  Pain is worse with: walking and standing Pain improves with: rest and medication Relief from Meds: 5  Family History  Problem Relation Age of Onset  . Hypertension Other   . Brain cancer Mother        Died at age 64  . Aneurysm Mother   . CAD Father   . Heart attack Father 42   Social History   Socioeconomic History  . Marital status: Divorced    Spouse name: Not on file  . Number of children: 2  . Years of education: Not on file  . Highest education level: Not on file  Occupational History  . Not on file  Tobacco Use  . Smoking status: Former Smoker    Packs/day: 0.50    Years: 13.00    Pack years: 6.50    Types: Cigarettes  . Smokeless tobacco: Never Used  Vaping Use  . Vaping Use: Never used  Substance and Sexual Activity  . Alcohol use: Yes    Comment: Occasional  . Drug use: No  . Sexual activity: Not on file  Other Topics Concern  .  Not on file  Social History Narrative  . Not on file   Social Determinants of Health   Financial Resource Strain: Not on file  Food Insecurity: Not on file  Transportation Needs: Not on file  Physical Activity: Not on file  Stress: Not on file  Social Connections: Not on file   Past Surgical History:  Procedure Laterality Date  . NEPHRECTOMY TRANSPLANTED ORGAN     Past Surgical History:  Procedure Laterality Date  . NEPHRECTOMY TRANSPLANTED ORGAN     Past Medical History:  Diagnosis Date  . Anemia   . Collagen vascular disease ([place])   . Lupus ([place])   . Renal disorder   . Renal insufficiency   . Renal transplant recipient    BP (!) 111/50   Pulse 60   Temp 98.4 F (36.9 C)   Ht '6\' 2"'$  (1.88 m)   Wt 197 lb (89.4 kg)   SpO2 (!) 87%   BMI 25.29 kg/m   Opioid Risk Score:   Fall Risk Score:  `1  Depression screen PHQ 2/9  Depression screen [facility] 2/9 11/15/2019 06/09/2019 05/03/2019 06/24/2018 04/27/2018  Decreased Interest 0 0 0 0 0  Down, Depressed, Hopeless 0 0 0  0 0  PHQ - 2 Score 0 0 0 0 0    Review of Systems  Constitutional: Negative.   HENT: Negative.   Eyes: Negative.   Respiratory: Negative.   Cardiovascular: Negative.   Gastrointestinal: Negative.   Endocrine: Negative.   Genitourinary: Negative.   Musculoskeletal: Positive for arthralgias, back pain and gait problem.  Allergic/Immunologic: Negative.   Hematological: Negative.   Psychiatric/Behavioral: Negative.        Objective:   Physical Exam Vitals and nursing note reviewed.  Constitutional:      Appearance: Normal appearance. He is ill-appearing.  Cardiovascular:     Rate and Rhythm: Normal rate and regular rhythm.     Pulses: Normal pulses.     Heart sounds: Normal heart sounds.  Pulmonary:     Effort: Pulmonary effort is normal.     Breath sounds: Normal breath sounds.  Abdominal:     General: Bowel sounds are normal.     Comments: Ascites  Musculoskeletal:     Cervical back: Normal  range of motion and neck supple.     Comments: Normal Muscle Bulk and Muscle Testing Reveals:  Upper Extremities: Full ROM and Muscle Strength 5/5  Lower Extremities: Decreased ROM and Muscle Strength 5/5 Anasarca noted : Bilateral Lower Extremities Arises from Table Slowly Antalgic  Gait   Skin:    General: Skin is warm and dry.  Neurological:     Mental Status: He is alert and oriented to person, place, and time.  Psychiatric:        Mood and Affect: Mood normal.        Behavior: Behavior normal.           Assessment & Plan:  1. Systemic Lupus Multiple Joint Involvement/ ESRD: Continue HEP as Tolerated. 03/01/2020 Rheumatology and Nephrology Following.  2. Tendonitis of both rotator cuffs: No complaints today.Continue HEP as Tolerated. Continue to Monitor.03/01/2020 3. Chronic Pain Syndrome:ContinueHydrocodone 10/325 mg one table every 8 hours as needed for pain. #90.Continue Voltaren Gel.03/01/2020. We will continue the opioid monitoring program, this consists of regular clinic visits, examinations, urine drug screen, pill counts as well as use of [place] Controlled Substance Reporting system. A 12 month History has been reviewed on the [place] Controlled Substance Reporting Systemon02/10/2020 4. Polyarthralgia: Continue to Monitor.03/01/2020 5.Greater Trochanter Bursitis:Continue to Alternate Ice and Heat Therapy. Continue to Monitor.03/01/2020 6. Chronic Bilateral Knee Pain:Continue HEP as Tolerated and Continue to Monitor.03/01/2020 7.Bilateral feet Pain: Continue HEP as Tolerated. Continue to Monitor.03/01/2020. 8. Chronic Bilateral Thoracic Pain:No complaints today.Continue current medication regimen. Continue to monitor.03/01/2020 9. Chronic Left Shoulder Pain:No Complaints today.Continue HEP as Tolerated. Continue to Monitor.03/01/2020.  F/U in 1 month

## 2020-03-27 ENCOUNTER — Encounter: Payer: Self-pay | Admitting: Registered Nurse

## 2020-03-27 ENCOUNTER — Other Ambulatory Visit: Payer: Self-pay

## 2020-03-27 ENCOUNTER — Encounter
Payer: No Typology Code available for payment source | Attending: Physical Medicine & Rehabilitation | Admitting: Registered Nurse

## 2020-03-27 VITALS — BP 94/52 | HR 87 | Temp 98.5°F | Ht 74.0 in | Wt 193.2 lb

## 2020-03-27 DIAGNOSIS — L930 Discoid lupus erythematosus: Secondary | ICD-10-CM

## 2020-03-27 DIAGNOSIS — G894 Chronic pain syndrome: Secondary | ICD-10-CM | POA: Diagnosis not present

## 2020-03-27 DIAGNOSIS — M25561 Pain in right knee: Secondary | ICD-10-CM | POA: Insufficient documentation

## 2020-03-27 DIAGNOSIS — M79672 Pain in left foot: Secondary | ICD-10-CM | POA: Diagnosis present

## 2020-03-27 DIAGNOSIS — Z5181 Encounter for therapeutic drug level monitoring: Secondary | ICD-10-CM | POA: Insufficient documentation

## 2020-03-27 DIAGNOSIS — M2550 Pain in unspecified joint: Secondary | ICD-10-CM | POA: Diagnosis present

## 2020-03-27 DIAGNOSIS — G8929 Other chronic pain: Secondary | ICD-10-CM | POA: Insufficient documentation

## 2020-03-27 DIAGNOSIS — Z79891 Long term (current) use of opiate analgesic: Secondary | ICD-10-CM | POA: Diagnosis present

## 2020-03-27 DIAGNOSIS — M79671 Pain in right foot: Secondary | ICD-10-CM | POA: Diagnosis not present

## 2020-03-27 DIAGNOSIS — M545 Low back pain, unspecified: Secondary | ICD-10-CM | POA: Insufficient documentation

## 2020-03-27 DIAGNOSIS — M25562 Pain in left knee: Secondary | ICD-10-CM | POA: Diagnosis present

## 2020-03-27 MED ORDER — HYDROCODONE-ACETAMINOPHEN 10-325 MG PO TABS: 1.0000 | ORAL_TABLET | Freq: Three times a day (TID) | ORAL | 0 refills | Status: DC | PRN

## 2020-03-27 NOTE — Progress Notes (Signed)
Subjective:    Patient ID: [name], male    DOB: 1970-02-02, 50 y.o.   MRN: [id number]  HPI: [name] is a 50 y.o. male who returns for follow up appointment for chronic pain and medication refill. He states his pain is located in his lower back, bilateral knees, bilateral feet and generalized joint pain. He rates his pain 6. His current exercise regime is walking and performing stretching exercises.  Mr. Salminen Morphine equivalent is 30.00 MME.  Last Oral Swab was Performed on 01/31/2020, it was consistent.    Pain Inventory Average Pain 5 Pain Right Now 6 My pain is constant, sharp and aching  In the last 24 hours, has pain interfered with the following? General activity 2 Relation with others 0 Enjoyment of life 4 What TIME of day is your pain at its worst? morning  Sleep (in general) Fair  Pain is worse with: walking and standing Pain improves with: rest and medication Relief from Meds: 5  Family History  Problem Relation Age of Onset  . Hypertension Other   . Brain cancer Mother        Died at age 3  . Aneurysm Mother   . CAD Father   . Heart attack Father 60   Social History   Socioeconomic History  . Marital status: Divorced    Spouse name: Not on file  . Number of children: 2  . Years of education: Not on file  . Highest education level: Not on file  Occupational History  . Not on file  Tobacco Use  . Smoking status: Former Smoker    Packs/day: 0.50    Years: 13.00    Pack years: 6.50    Types: Cigarettes  . Smokeless tobacco: Never Used  Vaping Use  . Vaping Use: Never used  Substance and Sexual Activity  . Alcohol use: Yes    Comment: Occasional  . Drug use: No  . Sexual activity: Not on file  Other Topics Concern  . Not on file  Social History Narrative  . Not on file   Social Determinants of Health   Financial Resource Strain: Not on file  Food Insecurity: Not on file  Transportation Needs: Not on file  Physical  Activity: Not on file  Stress: Not on file  Social Connections: Not on file   Past Surgical History:  Procedure Laterality Date  . NEPHRECTOMY TRANSPLANTED ORGAN     Past Surgical History:  Procedure Laterality Date  . NEPHRECTOMY TRANSPLANTED ORGAN     Past Medical History:  Diagnosis Date  . Anemia   . Collagen vascular disease ([place])   . Lupus ([place])   . Renal disorder   . Renal insufficiency   . Renal transplant recipient    There were no vitals taken for this visit.  Opioid Risk Score:   Fall Risk Score:  `1  Depression screen PHQ 2/9  Depression screen [facility] 2/9 11/15/2019 06/09/2019 05/03/2019 06/24/2018 04/27/2018  Decreased Interest 0 0 0 0 0  Down, Depressed, Hopeless 0 0 0 0 0  PHQ - 2 Score 0 0 0 0 0   Review of Systems  Musculoskeletal: Positive for arthralgias, back pain and gait problem.  All other systems reviewed and are negative.      Objective:   Physical Exam Vitals and nursing note reviewed.  Constitutional:      Appearance: Normal appearance.  Cardiovascular:     Rate and Rhythm: Normal rate and regular rhythm.  Pulses: Normal pulses.     Heart sounds: Normal heart sounds.  Pulmonary:     Effort: Pulmonary effort is normal.     Breath sounds: Normal breath sounds.  Musculoskeletal:     Cervical back: Normal range of motion and neck supple.     Right lower leg: Edema present.     Left lower leg: Edema present.     Comments: Normal Muscle Bulk and Muscle Testing Reveals:  Upper Extremities: Full ROM and Muscle Strength 5/5 Lower Extremities: Decreased ROM and Muscle Strength 5/5 Arises from Table Slowly  Antalgic  Gait   Skin:    General: Skin is warm and dry.  Neurological:     Mental Status: He is alert and oriented to person, place, and time.  Psychiatric:        Mood and Affect: Mood normal.        Behavior: Behavior normal.           Assessment & Plan:  1. Systemic Lupus Multiple Joint Involvement/ ESRD: Continue HEP as  Tolerated.03/27/2020 Rheumatology and Nephrology Following.  2. Tendonitis of both rotator cuffs: No complaints today.Continue HEP as Tolerated. Continue to Monitor.03/27/2020 3. Chronic Pain Syndrome:ContinueHydrocodone 10/325 mg one table every 8 hours as needed for pain. #90.Continue Voltaren Gel.03/27/2020. We will continue the opioid monitoring program, this consists of regular clinic visits, examinations, urine drug screen, pill counts as well as use of [place] Controlled Substance Reporting system. A 12 month History has been reviewed on the [place] Controlled Substance Reporting Systemon03/08/2020 4. Polyarthralgia: Continue to Monitor.03/27/2020 5.Greater Trochanter Bursitis:No Complaints today.Continue to Alternate Ice and Heat Therapy. Continue to Monitor.03/27/2020 6. Chronic Bilateral Knee Pain:Continue HEP as Tolerated and Continue to Monitor.03/27/2020 7.Bilateral feet Pain: Continue HEP as Tolerated. Continue to Monitor.03/27/2020. 8. Chronic Bilateral Thoracic Pain:No complaints today.Continue current medication regimen. Continue to monitor.03/27/2020 9. Chronic Left Shoulder Pain:No Complaints today.Continue HEP as Tolerated. Continue to Monitor.03/27/2020.  F/U in 1 month

## 2020-04-24 ENCOUNTER — Other Ambulatory Visit: Payer: Self-pay

## 2020-04-24 ENCOUNTER — Encounter: Payer: Self-pay | Admitting: Registered Nurse

## 2020-04-24 ENCOUNTER — Encounter
Payer: No Typology Code available for payment source | Attending: Physical Medicine & Rehabilitation | Admitting: Registered Nurse

## 2020-04-24 VITALS — BP 138/52 | HR 95 | Temp 97.9°F | Ht 74.0 in | Wt 182.2 lb

## 2020-04-24 DIAGNOSIS — G8929 Other chronic pain: Secondary | ICD-10-CM

## 2020-04-24 DIAGNOSIS — M2550 Pain in unspecified joint: Secondary | ICD-10-CM | POA: Diagnosis present

## 2020-04-24 DIAGNOSIS — M79672 Pain in left foot: Secondary | ICD-10-CM | POA: Diagnosis present

## 2020-04-24 DIAGNOSIS — Z5181 Encounter for therapeutic drug level monitoring: Secondary | ICD-10-CM | POA: Diagnosis present

## 2020-04-24 DIAGNOSIS — M25561 Pain in right knee: Secondary | ICD-10-CM

## 2020-04-24 DIAGNOSIS — M545 Low back pain, unspecified: Secondary | ICD-10-CM

## 2020-04-24 DIAGNOSIS — M7061 Trochanteric bursitis, right hip: Secondary | ICD-10-CM | POA: Diagnosis not present

## 2020-04-24 DIAGNOSIS — M79671 Pain in right foot: Secondary | ICD-10-CM | POA: Insufficient documentation

## 2020-04-24 DIAGNOSIS — M25562 Pain in left knee: Secondary | ICD-10-CM | POA: Diagnosis present

## 2020-04-24 DIAGNOSIS — M7062 Trochanteric bursitis, left hip: Secondary | ICD-10-CM | POA: Insufficient documentation

## 2020-04-24 DIAGNOSIS — Z79891 Long term (current) use of opiate analgesic: Secondary | ICD-10-CM | POA: Diagnosis present

## 2020-04-24 DIAGNOSIS — G894 Chronic pain syndrome: Secondary | ICD-10-CM | POA: Diagnosis present

## 2020-04-24 MED ORDER — HYDROCODONE-ACETAMINOPHEN 10-325 MG PO TABS: 1.0000 | ORAL_TABLET | Freq: Three times a day (TID) | ORAL | 0 refills | Status: DC | PRN

## 2020-04-24 MED ORDER — OXYCODONE HCL 5 MG PO TABS: 5.0000 mg | ORAL_TABLET | Freq: Three times a day (TID) | ORAL | 0 refills | Status: DC | PRN

## 2020-04-24 NOTE — Progress Notes (Signed)
Subjective:    Patient ID: [name], male    DOB: January 26, 1970, 50 y.o.   MRN: [id number]  HPI: [name] is a 50 y.o. male who returns for follow up appointment for chronic pain and medication refill. He states his pain is located in his lower back, bilateral hips, bilateral knees and bilateral feet. [name] reports he's only receiving 4 hours of relief of his pain, with current medication regimen. Medication list and allergies was reviewed, Oxycodone was on allergy lise stated drowsiness. [name] denies allergies to Oxycodone when he was hospitalized he received oxycodone without any side effect. We will prescribe him Oxycodone 5 mg, he verbalizes understanding. He  rates his pain 7. His current exercise regime is walking.   [name] Morphine equivalent is 30.00 MME. Last Oral Swab was Performed on  01/31/2020, it was consistent.   Pain Inventory Average Pain 6 Pain Right Now 7 My pain is constant, sharp and aching  In the last 24 hours, has pain interfered with the following? General activity 2 Relation with others 0 Enjoyment of life 1 What TIME of day is your pain at its worst? morning  Sleep (in general) Fair  Pain is worse with: walking, bending and standing Pain improves with: medication Relief from Meds: 6    Family History  Problem Relation Age of Onset  . Hypertension Other   . Brain cancer Mother        Died at age 58  . Aneurysm Mother   . CAD Father   . Heart attack Father 8   Social History   Socioeconomic History  . Marital status: Divorced    Spouse name: Not on file  . Number of children: 2  . Years of education: Not on file  . Highest education level: Not on file  Occupational History  . Not on file  Tobacco Use  . Smoking status: Former Smoker    Packs/day: 0.50    Years: 13.00    Pack years: 6.50    Types: Cigarettes  . Smokeless tobacco: Never Used  Vaping Use  . Vaping Use: Never used  Substance and Sexual Activity  .  Alcohol use: Yes    Comment: Occasional  . Drug use: No  . Sexual activity: Not on file  Other Topics Concern  . Not on file  Social History Narrative  . Not on file   Social Determinants of Health   Financial Resource Strain: Not on file  Food Insecurity: Not on file  Transportation Needs: Not on file  Physical Activity: Not on file  Stress: Not on file  Social Connections: Not on file   Past Surgical History:  Procedure Laterality Date  . NEPHRECTOMY TRANSPLANTED ORGAN     Past Medical History:  Diagnosis Date  . Anemia   . Collagen vascular disease ([place])   . Lupus ([place])   . Renal disorder   . Renal insufficiency   . Renal transplant recipient    BP (!) 138/52   Pulse 95   Temp (!) 79 F (26.1 C)   Ht '6\' 2"'$  (1.88 m)   Wt 182 lb 3.2 oz (82.6 kg)   SpO2 95%   BMI 23.39 kg/m   Opioid Risk Score:   Fall Risk Score:  `1  Depression screen PHQ 2/9  Depression screen [facility] 2/9 03/27/2020 11/15/2019 06/09/2019 05/03/2019 06/24/2018 04/27/2018  Decreased Interest 0 0 0 0 0 0  Down, Depressed, Hopeless 0 0 0 0  0 0  PHQ - 2 Score 0 0 0 0 0 0    Review of Systems  Musculoskeletal: Positive for back pain.       Knee pain  left foot pain  All other systems reviewed and are negative.      Objective:   Physical Exam Vitals and nursing note reviewed.  Constitutional:      Appearance: Normal appearance.  Cardiovascular:     Rate and Rhythm: Normal rate and regular rhythm.     Pulses: Normal pulses.     Heart sounds: Normal heart sounds.  Pulmonary:     Effort: Pulmonary effort is normal.     Breath sounds: Normal breath sounds.  Musculoskeletal:     Cervical back: Normal range of motion and neck supple.     Comments: Normal Muscle Bulk and Muscle Testing Reveals:  Upper Extremities: Full ROM and Muscle Strength 5/5  Lumbar Paraspinal Tenderness: L-3-L-5 Lower Extremities: Decreased ROM and Muscle Strength 5/5 Left Lower Extremity Flexion Produces pain into his left  patella Arises from Table Slowly Antalgic  Gait   Skin:    General: Skin is warm and dry.  Neurological:     Mental Status: He is alert and oriented to person, place, and time.  Psychiatric:        Mood and Affect: Mood normal.        Behavior: Behavior normal.           Assessment & Plan:  1. Systemic Lupus Multiple Joint Involvement/ ESRD: Continue HEP as Tolerated.04/24/2020 Rheumatology and Nephrology Following.  2. Tendonitis of both rotator cuffs: No complaints today.Continue HEP as Tolerated. Continue to Monitor.04/24/2020 3. Chronic Pain Syndrome:RX: Oxycodone 5 mg one tablet every 8 hours as needed for pain #90. Discontinued Hydrocodone ineffective. Continue Voltaren Gel.04/24/2020. We will continue the opioid monitoring program, this consists of regular clinic visits, examinations, urine drug screen, pill counts as well as use of [place] Controlled Substance Reporting system. A 12 month History has been reviewed on the [place] Controlled Substance Reporting Systemon04/05/2020 4. Polyarthralgia: Continue to Monitor.04/24/2020 5.Greater Trochanter Bursitis:Continue to Alternate Ice and Heat Therapy. Continue to Monitor.04/24/2020 6. Chronic Bilateral Knee Pain:Continue HEP as Tolerated and Continue to Monitor.04/24/2020 7.Bilateral feet Pain: Continue HEP as Tolerated. Continue to Monitor.04/24/2020. 8. Chronic Bilateral Thoracic Pain:No complaints today.Continue current medication regimen. Continue to monitor.04/24/2020 9. Chronic Left Shoulder Pain:No Complaints today.Continue HEP as Tolerated. Continue to Monitor.04/24/2020.  F/U in 1 month

## 2020-05-24 ENCOUNTER — Encounter
Payer: No Typology Code available for payment source | Attending: Physical Medicine & Rehabilitation | Admitting: Registered Nurse

## 2020-05-24 ENCOUNTER — Other Ambulatory Visit: Payer: Self-pay

## 2020-05-24 ENCOUNTER — Encounter: Payer: Self-pay | Admitting: Registered Nurse

## 2020-05-24 VITALS — BP 119/65 | HR 73 | Temp 98.8°F | Ht 74.0 in | Wt 166.6 lb

## 2020-05-24 DIAGNOSIS — M25562 Pain in left knee: Secondary | ICD-10-CM | POA: Diagnosis present

## 2020-05-24 DIAGNOSIS — M79672 Pain in left foot: Secondary | ICD-10-CM | POA: Insufficient documentation

## 2020-05-24 DIAGNOSIS — G894 Chronic pain syndrome: Secondary | ICD-10-CM | POA: Diagnosis not present

## 2020-05-24 DIAGNOSIS — M25561 Pain in right knee: Secondary | ICD-10-CM | POA: Diagnosis present

## 2020-05-24 DIAGNOSIS — M7062 Trochanteric bursitis, left hip: Secondary | ICD-10-CM | POA: Insufficient documentation

## 2020-05-24 DIAGNOSIS — M2550 Pain in unspecified joint: Secondary | ICD-10-CM

## 2020-05-24 DIAGNOSIS — Z79891 Long term (current) use of opiate analgesic: Secondary | ICD-10-CM | POA: Diagnosis not present

## 2020-05-24 DIAGNOSIS — M7061 Trochanteric bursitis, right hip: Secondary | ICD-10-CM | POA: Diagnosis not present

## 2020-05-24 DIAGNOSIS — Z5181 Encounter for therapeutic drug level monitoring: Secondary | ICD-10-CM

## 2020-05-24 DIAGNOSIS — M79671 Pain in right foot: Secondary | ICD-10-CM | POA: Diagnosis present

## 2020-05-24 DIAGNOSIS — G8929 Other chronic pain: Secondary | ICD-10-CM | POA: Diagnosis present

## 2020-05-24 DIAGNOSIS — L930 Discoid lupus erythematosus: Secondary | ICD-10-CM | POA: Insufficient documentation

## 2020-05-24 MED ORDER — OXYCODONE HCL 5 MG PO TABS: 5.0000 mg | ORAL_TABLET | Freq: Three times a day (TID) | ORAL | 0 refills | Status: DC | PRN

## 2020-05-24 NOTE — Progress Notes (Signed)
Subjective:    Patient ID: [name], male    DOB: [birth year], 50 y.o.   MRN: [id number]  HPI: [name] is a 50 y.o. male who returns for follow up appointment for chronic pain and medication refill. He states his pain is located in his bilateral hips, bilateral knees and bilateral feet. Also reports generalized joint pain. He  rates his pain 8. His current exercise regime is walking and performing stretching exercises.  [name] Morphine equivalent is 22.50 MME.  Oral Swab was performed today.    Pain Inventory Average Pain 5 Pain Right Now 8 My pain is sharp, stabbing and aching  In the last 24 hours, has pain interfered with the following? General activity 2 Relation with others 0 Enjoyment of life 2 What TIME of day is your pain at its worst? morning  Sleep (in general) Poor  Pain is worse with: walking and standing Pain improves with: rest and medication Relief from Meds: 6  Family History  Problem Relation Age of Onset  . Hypertension Other   . Brain cancer Mother        Died at age 29  . Aneurysm Mother   . CAD Father   . Heart attack Father 32   Social History   Socioeconomic History  . Marital status: Divorced    Spouse name: Not on file  . Number of children: 2  . Years of education: Not on file  . Highest education level: Not on file  Occupational History  . Not on file  Tobacco Use  . Smoking status: Former Smoker    Packs/day: 0.50    Years: 13.00    Pack years: 6.50    Types: Cigarettes  . Smokeless tobacco: Never Used  Vaping Use  . Vaping Use: Never used  Substance and Sexual Activity  . Alcohol use: Yes    Comment: Occasional  . Drug use: No  . Sexual activity: Not on file  Other Topics Concern  . Not on file  Social History Narrative  . Not on file   Social Determinants of Health   Financial Resource Strain: Not on file  Food Insecurity: Not on file  Transportation Needs: Not on file  Physical Activity: Not on file   Stress: Not on file  Social Connections: Not on file   Past Surgical History:  Procedure Laterality Date  . NEPHRECTOMY TRANSPLANTED ORGAN     Past Surgical History:  Procedure Laterality Date  . NEPHRECTOMY TRANSPLANTED ORGAN     Past Medical History:  Diagnosis Date  . Anemia   . Collagen vascular disease ([place])   . Lupus ([place])   . Renal disorder   . Renal insufficiency   . Renal transplant recipient    BP 119/65   Pulse 73   Temp 98.8 F (37.1 C)   Ht '6\' 2"'$  (1.88 m)   Wt 166 lb 9.6 oz (75.6 kg)   SpO2 99%   BMI 21.39 kg/m   Opioid Risk Score:   Fall Risk Score:  `1  Depression screen PHQ 2/9  Depression screen [facility] 2/9 03/27/2020 11/15/2019 06/09/2019 05/03/2019 06/24/2018 04/27/2018  Decreased Interest 0 0 0 0 0 0  Down, Depressed, Hopeless 0 0 0 0 0 0  PHQ - 2 Score 0 0 0 0 0 0      Review of Systems  Constitutional: Negative.   HENT: Negative.   Eyes: Negative.   Respiratory: Negative.   Cardiovascular: Negative.  Gastrointestinal: Negative.   Endocrine: Negative.   Genitourinary: Negative.   Musculoskeletal: Positive for back pain and gait problem.       RIGHT KNEE , LEFT FOOT PAIN  Skin: Negative.   Allergic/Immunologic: Negative.   Hematological: Negative.   Psychiatric/Behavioral: Negative.        Objective:   Physical Exam Vitals and nursing note reviewed.  Constitutional:      Appearance: Normal appearance.  Cardiovascular:     Rate and Rhythm: Normal rate and regular rhythm.     Pulses: Normal pulses.     Heart sounds: Normal heart sounds.  Pulmonary:     Effort: Pulmonary effort is normal.     Breath sounds: Normal breath sounds.  Musculoskeletal:     Cervical back: Normal range of motion and neck supple.     Comments: Normal Muscle Bulk and Muscle Testing Reveals:  Upper Extremities: Full ROM and Muscle Strength 5/5 Lower Extremities: Full ROM and Muscle Strength 5/5 Arises from Table Slowly Antalgic Gait   Skin:    General: Skin  is warm and dry.  Neurological:     Mental Status: He is alert and oriented to person, place, and time.  Psychiatric:        Mood and Affect: Mood normal.        Behavior: Behavior normal.           Assessment & Plan:  1. Systemic Lupus Multiple Joint Involvement/ ESRD: Continue HEP as Tolerated.05/24/2020 Rheumatology and Nephrology Following.  2. Tendonitis of both rotator cuffs: No complaints today.Continue HEP as Tolerated. Continue to Monitor.05/24/2020 3. Chronic Pain Syndrome:Refilled: Oxycodone 5 mg one tablet every 8 hours as needed for pain #90. Discontinued Hydrocodone ineffective. Continue Voltaren Gel.05/24/2020. We will continue the opioid monitoring program, this consists of regular clinic visits, examinations, urine drug screen, pill counts as well as use of [place] Controlled Substance Reporting system. A 12 month History has been reviewed on the [place] Controlled Substance Reporting Systemon05/05/2020 4. Polyarthralgia: Continue to Monitor.05/24/2020 5.Greater Trochanter Bursitis:Continue to Alternate Ice and Heat Therapy. Continue to Monitor.05/24/2020 6. Chronic Bilateral Knee Pain:Continue HEP as Tolerated and Continue to Monitor.05/24/2020 7.Bilateral feet Pain: Continue HEP as Tolerated. Continue to Monitor.05/24/2020. 8. Chronic Bilateral Thoracic Pain:No complaints today.Continue current medication regimen. Continue to monitor.05/24/2020 9. Chronic Left Shoulder Pain:No Complaints today.Continue HEP as Tolerated. Continue to Monitor.05/24/2020.  F/U in 1 month

## 2020-05-31 ENCOUNTER — Telehealth: Payer: Self-pay | Admitting: *Deleted

## 2020-05-31 LAB — DRUG TOX MONITOR 1 W/CONF, ORAL FLD
Amphetamines: NEGATIVE ng/mL (ref <10)
Barbiturates: NEGATIVE ng/mL (ref <10)
Benzodiazepines: NEGATIVE ng/mL (ref <0.50)
Buprenorphine: NEGATIVE ng/mL (ref <0.10)
Cocaine: NEGATIVE ng/mL (ref <5.0)
Codeine: NEGATIVE ng/mL (ref <2.5)
Cotinine: 199.6 ng/mL — ABNORMAL HIGH (ref <5.0)
Dihydrocodeine: NEGATIVE ng/mL (ref <2.5)
Fentanyl: NEGATIVE ng/mL (ref <0.10)
Heroin Metabolite: NEGATIVE ng/mL (ref <1.0)
Hydrocodone: NEGATIVE ng/mL (ref <2.5)
Hydromorphone: NEGATIVE ng/mL (ref <2.5)
MARIJUANA: NEGATIVE ng/mL (ref <2.5)
MDMA: NEGATIVE ng/mL (ref <10)
Meprobamate: NEGATIVE ng/mL (ref <2.5)
Methadone: NEGATIVE ng/mL (ref <5.0)
Morphine: NEGATIVE ng/mL (ref <2.5)
Nicotine Metabolite: POSITIVE ng/mL — AB (ref <5.0)
Norhydrocodone: NEGATIVE ng/mL (ref <2.5)
Noroxycodone: 13.9 ng/mL — ABNORMAL HIGH (ref <2.5)
Opiates: POSITIVE ng/mL — AB (ref <2.5)
Oxycodone: 49.6 ng/mL — ABNORMAL HIGH (ref <2.5)
Oxymorphone: NEGATIVE ng/mL (ref <2.5)
Phencyclidine: NEGATIVE ng/mL (ref <10)
Tapentadol: NEGATIVE ng/mL (ref <5.0)
Tramadol: NEGATIVE ng/mL (ref <5.0)
Zolpidem: NEGATIVE ng/mL (ref <5.0)

## 2020-05-31 LAB — DRUG TOX ALC METAB W/CON, ORAL FLD: Alcohol Metabolite: NEGATIVE ng/mL (ref <25)

## 2020-05-31 NOTE — Telephone Encounter (Signed)
Oral swab drug screen was consistent for prescribed medications.  ?

## 2020-06-21 ENCOUNTER — Other Ambulatory Visit: Payer: Self-pay

## 2020-06-21 ENCOUNTER — Encounter: Payer: No Typology Code available for payment source | Admitting: Registered Nurse

## 2020-06-21 ENCOUNTER — Telehealth: Payer: Self-pay | Admitting: Registered Nurse

## 2020-06-21 MED ORDER — OXYCODONE HCL 5 MG PO TABS: 5.0000 mg | ORAL_TABLET | Freq: Three times a day (TID) | ORAL | 0 refills | Status: DC | PRN

## 2020-06-21 NOTE — Telephone Encounter (Signed)
PMP was Reviewed> Oxycodone e-scribed today.  [name] appointment was changed due to awaiting [facility] Approval.

## 2020-06-21 NOTE — Telephone Encounter (Signed)
Please send medications

## 2020-07-16 ENCOUNTER — Telehealth: Payer: Self-pay | Admitting: Registered Nurse

## 2020-07-16 ENCOUNTER — Telehealth: Payer: Self-pay

## 2020-07-16 MED ORDER — OXYCODONE HCL 5 MG PO TABS: 5.0000 mg | ORAL_TABLET | Freq: Three times a day (TID) | ORAL | 0 refills | Status: DC | PRN

## 2020-07-16 NOTE — Telephone Encounter (Signed)
That would still charge him for a visit , so he will still get a bill since the [place] has not approved his visits

## 2020-07-16 NOTE — Telephone Encounter (Signed)
[name] awaiting for [place] paperwork to be submitted to our office. His appointment was changed to accommodate the above.  PMP was Reviewed Oxycodone e-scribed today.  Placed a call to [name] regarding the above, he verbalizes understanding.

## 2020-07-16 NOTE — Telephone Encounter (Signed)
This patient has an appointment tomorrow however we haven't received authorization from the [place] and he doesn't have any other form of insurance ,  which means we cant see him we offered an out of pocket visit  but he cant do that. but will be out of his oxycodone in 3 days per pmp last time filled 06/21/2020 . He would like to reschedule his appt until [place] issue is resolved however he doesn't want to ran out of meds is there a way we can fill it and reschedule his appt?

## 2020-07-17 ENCOUNTER — Encounter: Payer: No Typology Code available for payment source | Admitting: Registered Nurse

## 2020-08-07 ENCOUNTER — Other Ambulatory Visit: Payer: Self-pay

## 2020-08-07 ENCOUNTER — Encounter: Payer: Self-pay | Admitting: Registered Nurse

## 2020-08-07 ENCOUNTER — Encounter
Payer: No Typology Code available for payment source | Attending: Physical Medicine & Rehabilitation | Admitting: Registered Nurse

## 2020-08-07 VITALS — BP 141/69 | Temp 98.5°F | Ht 74.0 in | Wt 167.4 lb

## 2020-08-07 DIAGNOSIS — M25562 Pain in left knee: Secondary | ICD-10-CM

## 2020-08-07 DIAGNOSIS — M25561 Pain in right knee: Secondary | ICD-10-CM | POA: Diagnosis not present

## 2020-08-07 DIAGNOSIS — M79671 Pain in right foot: Secondary | ICD-10-CM | POA: Diagnosis present

## 2020-08-07 DIAGNOSIS — G894 Chronic pain syndrome: Secondary | ICD-10-CM | POA: Diagnosis not present

## 2020-08-07 DIAGNOSIS — M545 Low back pain, unspecified: Secondary | ICD-10-CM | POA: Diagnosis not present

## 2020-08-07 DIAGNOSIS — Z5181 Encounter for therapeutic drug level monitoring: Secondary | ICD-10-CM

## 2020-08-07 DIAGNOSIS — Z79891 Long term (current) use of opiate analgesic: Secondary | ICD-10-CM | POA: Diagnosis present

## 2020-08-07 DIAGNOSIS — M79672 Pain in left foot: Secondary | ICD-10-CM | POA: Diagnosis present

## 2020-08-07 DIAGNOSIS — G8929 Other chronic pain: Secondary | ICD-10-CM | POA: Diagnosis present

## 2020-08-07 MED ORDER — HYDROCODONE-ACETAMINOPHEN 10-325 MG PO TABS: 1.0000 | ORAL_TABLET | Freq: Three times a day (TID) | ORAL | 0 refills | Status: DC | PRN

## 2020-08-07 NOTE — Progress Notes (Signed)
Subjective:    Patient ID: [name], male    DOB: November 18, 1970, 50 y.o.   MRN: [id number]  HPI: [name] is a 50 y.o. male who returns for follow up appointment for chronic pain and medication refill. He states his pain is located in his lower back pain, bilateral knee and bilateral feet pain. He rates his pain 6. His current exercise regime is walking and performing stretching exercises.  [name] Morphine equivalent is 22.50 MME.   [name] reports he is only receiving 4 hours of relief with his oxycodone, we will resume his Hydrocodone, he verbalizes understanding.   [name] states he will be going on vacation next month on 08/30/2020- 09/17/2020, he states he will be driving to [place]. Discussed with [name] regarding being safe on the road and he should consider taking his daughter with him. He states he has been driving to [place] for years, and will never put himself in harms way. Emotional support given and he verbalizes understanding.     Pain Inventory Average Pain 6 Pain Right Now 6 My pain is sharp, stabbing, and aching  In the last 24 hours, has pain interfered with the following? General activity 2 Relation with others 0 Enjoyment of life 1 What TIME of day is your pain at its worst? morning  Sleep (in general) Fair  Pain is worse with: walking and standing Pain improves with: rest and medication Relief from Meds: 5  Family History  Problem Relation Age of Onset   Hypertension Other    Brain cancer Mother        Died at age 43   Aneurysm Mother    CAD Father    Heart attack Father 16   Social History   Socioeconomic History   Marital status: Divorced    Spouse name: Not on file   Number of children: 2   Years of education: Not on file   Highest education level: Not on file  Occupational History   Not on file  Tobacco Use   Smoking status: Former    Packs/day: 0.50    Years: 13.00    Pack years: 6.50    Types: Cigarettes    Smokeless tobacco: Never  Vaping Use   Vaping Use: Never used  Substance and Sexual Activity   Alcohol use: Yes    Comment: Occasional   Drug use: No   Sexual activity: Not on file  Other Topics Concern   Not on file  Social History Narrative   Not on file   Social Determinants of Health   Financial Resource Strain: Not on file  Food Insecurity: Not on file  Transportation Needs: Not on file  Physical Activity: Not on file  Stress: Not on file  Social Connections: Not on file   Past Surgical History:  Procedure Laterality Date   NEPHRECTOMY TRANSPLANTED ORGAN     Past Surgical History:  Procedure Laterality Date   NEPHRECTOMY TRANSPLANTED ORGAN     Past Medical History:  Diagnosis Date   Anemia    Collagen vascular disease ([place])    Lupus ([place])    Renal disorder    Renal insufficiency    Renal transplant recipient    BP (!) 141/69 (BP Location: Right Arm, Patient Position: Sitting)   Temp 98.5 F (36.9 C) (Oral)   Ht '6\' 2"'$  (1.88 m)   Wt 167 lb 6.4 oz (75.9 kg)   SpO2 (!) 87%   BMI 21.49 kg/m  Opioid Risk Score:   Fall Risk Score:  `1  Depression screen PHQ 2/9  Depression screen [facility] 2/9 08/07/2020 05/24/2020 03/27/2020 11/15/2019 06/09/2019 05/03/2019 06/24/2018  Decreased Interest 0 0 0 0 0 0 0  Down, Depressed, Hopeless 0 0 0 0 0 0 0  PHQ - 2 Score 0 0 0 0 0 0 0     Review of Systems  Constitutional: Negative.   HENT: Negative.    Eyes: Negative.   Respiratory: Negative.    Cardiovascular: Negative.   Gastrointestinal: Negative.   Endocrine: Negative.   Genitourinary: Negative.   Musculoskeletal:  Positive for back pain and gait problem.       Right and left knee pain , right and left foot pain.  Skin: Negative.   Allergic/Immunologic: Negative.   Hematological: Negative.   Psychiatric/Behavioral: Negative.        Objective:   Physical Exam Vitals and nursing note reviewed.  Constitutional:      Appearance: Normal appearance. He is  ill-appearing.  Cardiovascular:     Rate and Rhythm: Normal rate and regular rhythm.     Pulses: Normal pulses.     Heart sounds: Normal heart sounds.  Pulmonary:     Effort: Pulmonary effort is normal.     Breath sounds: Normal breath sounds.  Musculoskeletal:     Cervical back: Normal range of motion and neck supple.     Right lower leg: Edema present.     Left lower leg: Edema present.     Comments: Normal Muscle Bulk and Muscle Testing Reveals:  Upper Extremities: Full ROM and Muscle Strength 5/5  Lower Extremities: Full ROM and Muscle Strength 5/5 Arises from Table slowly Narrow Based  Gait     Skin:    General: Skin is warm and dry.  Neurological:     Mental Status: He is alert and oriented to person, place, and time.  Psychiatric:        Mood and Affect: Mood normal.        Behavior: Behavior normal.         Assessment & Plan:  1. Systemic Lupus Multiple Joint Involvement/ ESRD: Continue HEP as Tolerated. 08/07/2020 Rheumatology and Nephrology Following. 2. Tendonitis of both rotator cuffs: No complaints today. Continue HEP as Tolerated. Continue to Monitor. 08/07/2020 3. Chronic Pain Syndrome:RX: Hydrocodone 10/325 mg one tablet every 8 hours as needed for pain #90.  Discontinued Oxycodone ineffective.  Continue Voltaren Gel . 08/07/2020. We will continue the opioid monitoring program, this consists of regular clinic visits, examinations, urine drug screen, pill counts as well as use of [place] Controlled Substance Reporting system. A 12 month History has been reviewed on the [place] Controlled Substance Reporting System on 08/07/2020 4. Polyarthralgia: Continue to Monitor. 08/07/2020 5.Greater Trochanter Bursitis: No complaints today. Continue to Alternate Ice and Heat Therapy. Continue to Monitor. 08/07/2020 6. Chronic Bilateral Knee Pain: Continue HEP as Tolerated and Continue to Monitor. 08/07/2020 7.Bilateral feet Pain: [name] reports he is  awaiting appointment with podiatry. Continue HEP as Tolerated. Continue to Monitor. 08/07/2020. 8. Chronic Bilateral Thoracic Pain: No complaints today. Continue current medication regimen. Continue to monitor. 08/07/2020 9. Chronic Left Shoulder Pain: No Complaints today. Continue HEP as Tolerated. Continue to Monitor. 08/07/2020.   F/U in 1 month

## 2020-09-18 ENCOUNTER — Encounter: Payer: Self-pay | Admitting: Registered Nurse

## 2020-09-18 ENCOUNTER — Other Ambulatory Visit: Payer: Self-pay

## 2020-09-18 ENCOUNTER — Encounter
Payer: No Typology Code available for payment source | Attending: Physical Medicine & Rehabilitation | Admitting: Registered Nurse

## 2020-09-18 VITALS — BP 127/56 | HR 71 | Temp 98.5°F | Ht 74.0 in | Wt 169.8 lb

## 2020-09-18 DIAGNOSIS — Z79891 Long term (current) use of opiate analgesic: Secondary | ICD-10-CM

## 2020-09-18 DIAGNOSIS — G8929 Other chronic pain: Secondary | ICD-10-CM | POA: Diagnosis present

## 2020-09-18 DIAGNOSIS — M25562 Pain in left knee: Secondary | ICD-10-CM | POA: Diagnosis present

## 2020-09-18 DIAGNOSIS — M7062 Trochanteric bursitis, left hip: Secondary | ICD-10-CM | POA: Diagnosis present

## 2020-09-18 DIAGNOSIS — M2550 Pain in unspecified joint: Secondary | ICD-10-CM | POA: Diagnosis present

## 2020-09-18 DIAGNOSIS — M25561 Pain in right knee: Secondary | ICD-10-CM | POA: Diagnosis not present

## 2020-09-18 DIAGNOSIS — M545 Low back pain, unspecified: Secondary | ICD-10-CM | POA: Diagnosis not present

## 2020-09-18 DIAGNOSIS — Z5181 Encounter for therapeutic drug level monitoring: Secondary | ICD-10-CM

## 2020-09-18 DIAGNOSIS — G894 Chronic pain syndrome: Secondary | ICD-10-CM

## 2020-09-18 DIAGNOSIS — M7061 Trochanteric bursitis, right hip: Secondary | ICD-10-CM

## 2020-09-18 MED ORDER — HYDROCODONE-ACETAMINOPHEN 10-325 MG PO TABS: 1.0000 | ORAL_TABLET | Freq: Three times a day (TID) | ORAL | 0 refills | Status: DC | PRN

## 2020-09-18 NOTE — Progress Notes (Signed)
Subjective:    Patient ID: [name], male    DOB: 07-21-1970, 50 y.o.   MRN: [id number]  HPI: SHAWNTA DAHER is a 50 y.o. male who returns for follow up appointment for chronic pain and medication refill. She states her pain is located in his lower back, bilateral hips and bilateral knee pain. Also reports generalized joint pain. He rates his pain 8. Her current exercise regime is walking   Mr. Vanson Morphine equivalent is 30.00 MME.   Last Oral Swab was Performed on 05/24/2020, it was consistent.     Pain Inventory Average Pain 5 Pain Right Now 8 My pain is constant, sharp, stabbing, and aching  In the last 24 hours, has pain interfered with the following? General activity 5 Relation with others 1 Enjoyment of life 2  What TIME of day is your pain at its worst? morning  Sleep (in general) Fair  Pain is worse with: walking, bending, and standing Pain improves with: rest and medication Relief from Meds: 5  Family History  Problem Relation Age of Onset   Hypertension Other    Brain cancer Mother        Died at age 67   Aneurysm Mother    CAD Father    Heart attack Father 80   Social History   Socioeconomic History   Marital status: Divorced    Spouse name: Not on file   Number of children: 2   Years of education: Not on file   Highest education level: Not on file  Occupational History   Not on file  Tobacco Use   Smoking status: Former    Packs/day: 0.50    Years: 13.00    Pack years: 6.50    Types: Cigarettes   Smokeless tobacco: Never  Vaping Use   Vaping Use: Never used  Substance and Sexual Activity   Alcohol use: Yes    Comment: Occasional   Drug use: No   Sexual activity: Not on file  Other Topics Concern   Not on file  Social History Narrative   Not on file   Social Determinants of Health   Financial Resource Strain: Not on file  Food Insecurity: Not on file  Transportation Needs: Not on file  Physical Activity: Not on file   Stress: Not on file  Social Connections: Not on file   Past Surgical History:  Procedure Laterality Date   NEPHRECTOMY TRANSPLANTED ORGAN     Past Surgical History:  Procedure Laterality Date   NEPHRECTOMY TRANSPLANTED ORGAN     Past Medical History:  Diagnosis Date   Anemia    Collagen vascular disease ([facility])    Lupus ([place])    Renal disorder    Renal insufficiency    Renal transplant recipient    BP (!) 127/56   Pulse 71   Temp 98.5 F (36.9 C)   Ht '6\' 2"'$  (1.88 m)   Wt 169 lb 12.8 oz (77 kg)   SpO2 91%   BMI 21.80 kg/m   Opioid Risk Score:   Fall Risk Score:  `1  Depression screen PHQ 2/9  Depression screen [facility] 2/9 08/07/2020 05/24/2020 03/27/2020 11/15/2019 06/09/2019 05/03/2019 06/24/2018  Decreased Interest 0 0 0 0 0 0 0  Down, Depressed, Hopeless 0 0 0 0 0 0 0  PHQ - 2 Score 0 0 0 0 0 0 0    Review of Systems  Musculoskeletal:  Positive for gait problem.  Pain in the back & knees  All other systems reviewed and are negative.     Objective:   Physical Exam Vitals and nursing note reviewed.  Constitutional:      Appearance: Normal appearance.  Cardiovascular:     Rate and Rhythm: Normal rate and regular rhythm.     Pulses: Normal pulses.     Heart sounds: Normal heart sounds.  Pulmonary:     Effort: Pulmonary effort is normal.     Breath sounds: Normal breath sounds.  Musculoskeletal:     Cervical back: Normal range of motion and neck supple.     Comments: Normal Muscle Bulk and Muscle Testing Reveals:  Upper Extremities: Full ROM and Muscle Strength  5/5  Lower Extremities: Decreased ROM and Muscle Strength  4/5 Arises From Table Slowly Antalgic Gait     Skin:    General: Skin is warm and dry.  Neurological:     Mental Status: He is alert and oriented to person, place, and time.  Psychiatric:        Mood and Affect: Mood normal.        Behavior: Behavior normal.         Assessment & Plan:  1. Systemic Lupus Multiple Joint Involvement/  ESRD: Continue HEP as Tolerated. 09/18/2020 Rheumatology and Nephrology Following. 2. Tendonitis of both rotator cuffs: No complaints today. Continue HEP as Tolerated. Continue to Monitor. 09/18/2020 3. Chronic Pain Syndrome:Refilled: Hydrocodone 10/325 mg one tablet every 8 hours as needed for pain #90.  Discontinued Oxycodone ineffective.  Continue Voltaren Gel . 09/18/2020. We will continue the opioid monitoring program, this consists of regular clinic visits, examinations, urine drug screen, pill counts as well as use of [place] Controlled Substance Reporting system. A 12 month History has been reviewed on the [place] Controlled Substance Reporting System on 09/18/2020 4. Polyarthralgia: Continue to Monitor. 09/18/2020 5.Greater Trochanter Bursitis: No complaints today. Continue to Alternate Ice and Heat Therapy. Continue to Monitor. 09/18/2020 6. Chronic Bilateral Knee Pain: Continue HEP as Tolerated and Continue to Monitor. 09/18/2020 7.Bilateral feet Pain: Mr. Lotito reports he is awaiting appointment with podiatry. Continue HEP as Tolerated. Continue to Monitor. 09/18/2020. 8. Chronic Bilateral Thoracic Pain: No complaints today. Continue current medication regimen. Continue to monitor. 09/18/2020 9. Chronic Left Shoulder Pain: No Complaints today. Continue HEP as Tolerated. Continue to Monitor. 09/18/2020.   F/U in 1 month

## 2020-10-09 ENCOUNTER — Telehealth: Payer: Self-pay | Admitting: Registered Nurse

## 2020-10-09 NOTE — Telephone Encounter (Signed)
Patient was admitted to Hospital for Cellulitis, he will call to reschedule once discharged.

## 2020-10-16 ENCOUNTER — Encounter: Payer: No Typology Code available for payment source | Admitting: Registered Nurse

## 2020-10-23 ENCOUNTER — Encounter
Payer: No Typology Code available for payment source | Attending: Physical Medicine & Rehabilitation | Admitting: Registered Nurse

## 2020-10-23 ENCOUNTER — Other Ambulatory Visit: Payer: Self-pay

## 2020-10-23 ENCOUNTER — Encounter: Payer: Self-pay | Admitting: Registered Nurse

## 2020-10-23 VITALS — BP 115/51 | HR 78 | Temp 99.1°F | Ht 74.0 in | Wt 185.8 lb

## 2020-10-23 DIAGNOSIS — M25562 Pain in left knee: Secondary | ICD-10-CM | POA: Insufficient documentation

## 2020-10-23 DIAGNOSIS — Z5181 Encounter for therapeutic drug level monitoring: Secondary | ICD-10-CM | POA: Insufficient documentation

## 2020-10-23 DIAGNOSIS — M2550 Pain in unspecified joint: Secondary | ICD-10-CM | POA: Diagnosis present

## 2020-10-23 DIAGNOSIS — G894 Chronic pain syndrome: Secondary | ICD-10-CM | POA: Insufficient documentation

## 2020-10-23 DIAGNOSIS — Z79891 Long term (current) use of opiate analgesic: Secondary | ICD-10-CM | POA: Diagnosis present

## 2020-10-23 DIAGNOSIS — G8929 Other chronic pain: Secondary | ICD-10-CM | POA: Insufficient documentation

## 2020-10-23 DIAGNOSIS — M79672 Pain in left foot: Secondary | ICD-10-CM | POA: Insufficient documentation

## 2020-10-23 DIAGNOSIS — R54 Age-related physical debility: Secondary | ICD-10-CM | POA: Diagnosis present

## 2020-10-23 DIAGNOSIS — M545 Low back pain, unspecified: Secondary | ICD-10-CM | POA: Insufficient documentation

## 2020-10-23 DIAGNOSIS — R531 Weakness: Secondary | ICD-10-CM | POA: Insufficient documentation

## 2020-10-23 MED ORDER — HYDROCODONE-ACETAMINOPHEN 10-325 MG PO TABS: 1.0000 | ORAL_TABLET | Freq: Three times a day (TID) | ORAL | 0 refills | Status: DC | PRN

## 2020-10-23 NOTE — Progress Notes (Signed)
Subjective:    Patient ID: [name], male    DOB: 1970-08-10, 50 y.o.   MRN: [id number]  HPI: [name] is a 50 y.o. male who returns for follow up appointment for chronic pain and medication refill. He states his pain is located in his lower back, left lower extremity, left knee and left foot  Also reports generalized joint pain. He rates his pain 8. His current exercise regime is walking and performing stretching exercises.  [name] Morphine equivalent is 40.00 MME.   Last Oral Swab was Performed on 05/24/2020, it was consistent.   [name] states he was hospitalized at the [facility] in [place] on 10/09/2020- 10/18/2020 Pain Inventory Average Pain 7 Pain Right Now 8 My pain is constant, sharp, and stabbing  In the last 24 hours, has pain interfered with the following? General activity 7 Relation with others 0 Enjoyment of life 0 What TIME of day is your pain at its worst? morning  Sleep (in general) Fair  Pain is worse with: walking and bending Pain improves with: rest and medication Relief from Meds: 7  Family History  Problem Relation Age of Onset   Hypertension Other    Brain cancer Mother        Died at age 52   Aneurysm Mother    CAD Father    Heart attack Father 97   Social History   Socioeconomic History   Marital status: Divorced    Spouse name: Not on file   Number of children: 2   Years of education: Not on file   Highest education level: Not on file  Occupational History   Not on file  Tobacco Use   Smoking status: Former    Packs/day: 0.50    Years: 13.00    Pack years: 6.50    Types: Cigarettes   Smokeless tobacco: Never  Vaping Use   Vaping Use: Never used  Substance and Sexual Activity   Alcohol use: Yes    Comment: Occasional   Drug use: No   Sexual activity: Not on file  Other Topics Concern   Not on file  Social History Narrative   Not on file   Social Determinants of Health   Financial Resource Strain: Not on  file  Food Insecurity: Not on file  Transportation Needs: Not on file  Physical Activity: Not on file  Stress: Not on file  Social Connections: Not on file   Past Surgical History:  Procedure Laterality Date   NEPHRECTOMY TRANSPLANTED ORGAN     Past Surgical History:  Procedure Laterality Date   NEPHRECTOMY TRANSPLANTED ORGAN     Past Medical History:  Diagnosis Date   Anemia    Collagen vascular disease (HCC)    Lupus ([place])    Renal disorder    Renal insufficiency    Renal transplant recipient    BP (!) 111/58   Pulse 78   Temp 99.1 F (37.3 C)   Ht '6\' 2"'$  (1.88 m)   Wt 185 lb 12.8 oz (84.3 kg)   SpO2 96%   BMI 23.86 kg/m   Opioid Risk Score:   Fall Risk Score:  `1  Depression screen PHQ 2/9  Depression screen [facility] 2/9 09/18/2020 08/07/2020 05/24/2020 03/27/2020 11/15/2019 06/09/2019 05/03/2019  Decreased Interest 1 0 0 0 0 0 0  Down, Depressed, Hopeless 1 0 0 0 0 0 0  PHQ - 2 Score 2 0 0 0 0 0 0  Review of Systems  Musculoskeletal:  Positive for back pain and gait problem.       Left knee, left leg pain  All other systems reviewed and are negative.     Objective:   Physical Exam Vitals and nursing note reviewed.  Constitutional:      Appearance: He is ill-appearing.     Comments: Weakness ad Frail  Cardiovascular:     Rate and Rhythm: Normal rate and regular rhythm.     Pulses: Normal pulses.     Heart sounds: Normal heart sounds.  Pulmonary:     Effort: Pulmonary effort is normal.     Breath sounds: Normal breath sounds.  Musculoskeletal:     Cervical back: Normal range of motion and neck supple.     Right lower leg: Edema present.     Left lower leg: Edema present.  Skin:    General: Skin is warm and dry.  Neurological:     Mental Status: He is alert and oriented to person, place, and time.  Psychiatric:        Mood and Affect: Mood normal.        Behavior: Behavior normal.         Assessment & Plan:  1. Systemic Lupus Multiple Joint  Involvement/ ESRD: Continue HEP as Tolerated. 10/23/2020 Rheumatology and Nephrology Following. 2. Tendonitis of both rotator cuffs: No complaints today. Continue HEP as Tolerated. Continue to Monitor. 10/23/2020 3. Chronic Pain Syndrome:Refilled: Hydrocodone 10/325 mg one tablet every 8 hours as needed for pain #90.  Continue Voltaren Gel . 10/23/2020. We will continue the opioid monitoring program, this consists of regular clinic visits, examinations, urine drug screen, pill counts as well as use of [place] Controlled Substance Reporting system. A 12 month History has been reviewed on the [place] Controlled Substance Reporting System on 10/23/2020 4. Polyarthralgia: Continue to Monitor. 10/23/2020 5.Greater Trochanter Bursitis: No complaints today. Continue to Alternate Ice and Heat Therapy. Continue to Monitor. 10/23/2020 6. Chronic Bilateral Knee Pain: No complaints today.Continue HEP as Tolerated and Continue to Monitor. 10/23/2020 7.Bilateral feet Pain: [name] reports he is awaiting appointment with podiatry. Continue HEP as Tolerated. Continue to Monitor. 10/23/2020. 8. Chronic Bilateral Thoracic Pain: No complaints today. Continue current medication regimen. Continue to monitor. 10/23/2020 9. Chronic Left Shoulder Pain: No Complaints today. Continue HEP as Tolerated. Continue to Monitor. 10/23/2020.  10. Left lower Extremity Pain/ Left Foot Pain: Continue HEP as tolerated. Continue current medication regimen as tolerated. Continue to Monitor.  F/U in 1 month

## 2020-11-20 ENCOUNTER — Encounter: Payer: No Typology Code available for payment source | Admitting: Registered Nurse

## 2021-01-17 ENCOUNTER — Telehealth: Payer: Self-pay | Admitting: Registered Nurse

## 2021-01-17 NOTE — Telephone Encounter (Signed)
Patient is currently hospitalized he will call when discharged to reschedule

## 2021-01-22 ENCOUNTER — Encounter: Payer: No Typology Code available for payment source | Admitting: Registered Nurse

## 2021-07-25 ENCOUNTER — Inpatient Hospital Stay (HOSPITAL_COMMUNITY)
Admission: EM | Admit: 2021-07-25 | Discharge: 2021-07-28 | DRG: 640 | Disposition: A | Discharge status: Skilled Nursing Facility | Payer: No Typology Code available for payment source | Attending: Internal Medicine | Admitting: Internal Medicine

## 2021-07-25 ENCOUNTER — Emergency Department (HOSPITAL_COMMUNITY): Payer: No Typology Code available for payment source

## 2021-07-25 ENCOUNTER — Encounter (HOSPITAL_COMMUNITY): Payer: Self-pay | Admitting: Emergency Medicine

## 2021-07-25 ENCOUNTER — Other Ambulatory Visit: Payer: Self-pay

## 2021-07-25 DIAGNOSIS — D509 Iron deficiency anemia, unspecified: Secondary | ICD-10-CM | POA: Diagnosis present

## 2021-07-25 DIAGNOSIS — L89301 Pressure ulcer of unspecified buttock, stage 1: Secondary | ICD-10-CM | POA: Diagnosis not present

## 2021-07-25 DIAGNOSIS — M898X9 Other specified disorders of bone, unspecified site: Secondary | ICD-10-CM | POA: Diagnosis present

## 2021-07-25 DIAGNOSIS — Z9981 Dependence on supplemental oxygen: Secondary | ICD-10-CM

## 2021-07-25 DIAGNOSIS — Z7952 Long term (current) use of systemic steroids: Secondary | ICD-10-CM

## 2021-07-25 DIAGNOSIS — E8779 Other fluid overload: Secondary | ICD-10-CM

## 2021-07-25 DIAGNOSIS — D631 Anemia in chronic kidney disease: Secondary | ICD-10-CM | POA: Diagnosis present

## 2021-07-25 DIAGNOSIS — Z87891 Personal history of nicotine dependence: Secondary | ICD-10-CM | POA: Diagnosis not present

## 2021-07-25 DIAGNOSIS — Z7901 Long term (current) use of anticoagulants: Secondary | ICD-10-CM

## 2021-07-25 DIAGNOSIS — I4510 Unspecified right bundle-branch block: Secondary | ICD-10-CM | POA: Diagnosis present

## 2021-07-25 DIAGNOSIS — E162 Hypoglycemia, unspecified: Secondary | ICD-10-CM | POA: Diagnosis present

## 2021-07-25 DIAGNOSIS — M25562 Pain in left knee: Secondary | ICD-10-CM | POA: Diagnosis not present

## 2021-07-25 DIAGNOSIS — I272 Pulmonary hypertension, unspecified: Secondary | ICD-10-CM | POA: Diagnosis present

## 2021-07-25 DIAGNOSIS — I4891 Unspecified atrial fibrillation: Secondary | ICD-10-CM | POA: Diagnosis present

## 2021-07-25 DIAGNOSIS — L8990 Pressure ulcer of unspecified site, unspecified stage: Secondary | ICD-10-CM | POA: Insufficient documentation

## 2021-07-25 DIAGNOSIS — Z808 Family history of malignant neoplasm of other organs or systems: Secondary | ICD-10-CM | POA: Diagnosis not present

## 2021-07-25 DIAGNOSIS — N186 End stage renal disease: Secondary | ICD-10-CM | POA: Diagnosis present

## 2021-07-25 DIAGNOSIS — Z91158 Patient's noncompliance with renal dialysis for other reason: Secondary | ICD-10-CM

## 2021-07-25 DIAGNOSIS — J9611 Chronic respiratory failure with hypoxia: Secondary | ICD-10-CM | POA: Diagnosis present

## 2021-07-25 DIAGNOSIS — Z992 Dependence on renal dialysis: Secondary | ICD-10-CM

## 2021-07-25 DIAGNOSIS — R7989 Other specified abnormal findings of blood chemistry: Secondary | ICD-10-CM | POA: Diagnosis not present

## 2021-07-25 DIAGNOSIS — Z8249 Family history of ischemic heart disease and other diseases of the circulatory system: Secondary | ICD-10-CM

## 2021-07-25 DIAGNOSIS — L89321 Pressure ulcer of left buttock, stage 1: Secondary | ICD-10-CM | POA: Diagnosis present

## 2021-07-25 DIAGNOSIS — Z885 Allergy status to narcotic agent status: Secondary | ICD-10-CM

## 2021-07-25 DIAGNOSIS — K219 Gastro-esophageal reflux disease without esophagitis: Secondary | ICD-10-CM | POA: Diagnosis present

## 2021-07-25 DIAGNOSIS — E8770 Fluid overload, unspecified: Principal | ICD-10-CM | POA: Diagnosis present

## 2021-07-25 DIAGNOSIS — J9601 Acute respiratory failure with hypoxia: Secondary | ICD-10-CM | POA: Diagnosis present

## 2021-07-25 DIAGNOSIS — M3214 Glomerular disease in systemic lupus erythematosus: Secondary | ICD-10-CM | POA: Diagnosis present

## 2021-07-25 DIAGNOSIS — L89311 Pressure ulcer of right buttock, stage 1: Secondary | ICD-10-CM | POA: Diagnosis present

## 2021-07-25 DIAGNOSIS — Z79899 Other long term (current) drug therapy: Secondary | ICD-10-CM

## 2021-07-25 DIAGNOSIS — Z862 Personal history of diseases of the blood and blood-forming organs and certain disorders involving the immune mechanism: Secondary | ICD-10-CM | POA: Diagnosis not present

## 2021-07-25 DIAGNOSIS — Z881 Allergy status to other antibiotic agents status: Secondary | ICD-10-CM

## 2021-07-25 DIAGNOSIS — I4892 Unspecified atrial flutter: Secondary | ICD-10-CM | POA: Diagnosis present

## 2021-07-25 DIAGNOSIS — I120 Hypertensive chronic kidney disease with stage 5 chronic kidney disease or end stage renal disease: Secondary | ICD-10-CM | POA: Diagnosis present

## 2021-07-25 DIAGNOSIS — Z888 Allergy status to other drugs, medicaments and biological substances status: Secondary | ICD-10-CM

## 2021-07-25 DIAGNOSIS — N189 Chronic kidney disease, unspecified: Secondary | ICD-10-CM | POA: Diagnosis not present

## 2021-07-25 DIAGNOSIS — Z882 Allergy status to sulfonamides status: Secondary | ICD-10-CM

## 2021-07-25 DIAGNOSIS — Z886 Allergy status to analgesic agent status: Secondary | ICD-10-CM

## 2021-07-25 DIAGNOSIS — M1712 Unilateral primary osteoarthritis, left knee: Secondary | ICD-10-CM | POA: Diagnosis present

## 2021-07-25 LAB — COMPREHENSIVE METABOLIC PANEL
ALT: 9 U/L (ref 0–44)
AST: 15 U/L (ref 15–41)
Albumin: 3.1 g/dL — ABNORMAL LOW (ref 3.5–5.0)
Alkaline Phosphatase: 147 U/L — ABNORMAL HIGH (ref 38–126)
Anion gap: 11 (ref 5–15)
BUN: 32 mg/dL — ABNORMAL HIGH (ref 6–20)
CO2: 29 mmol/L (ref 22–32)
Calcium: 9.3 mg/dL (ref 8.9–10.3)
Chloride: 98 mmol/L (ref 98–111)
Creatinine, Ser: 5.32 mg/dL — ABNORMAL HIGH (ref 0.61–1.24)
GFR, Estimated: 12 mL/min — ABNORMAL LOW (ref >60)
Glucose, Bld: 62 mg/dL — ABNORMAL LOW (ref 70–99)
Potassium: 4.1 mmol/L (ref 3.5–5.1)
Sodium: 138 mmol/L (ref 135–145)
Total Bilirubin: 1.2 mg/dL (ref 0.3–1.2)
Total Protein: 7.7 g/dL (ref 6.5–8.1)

## 2021-07-25 LAB — CBC WITH DIFFERENTIAL/PLATELET
Abs Immature Granulocytes: 0.05 10*3/uL (ref 0.00–0.07)
Basophils Absolute: 0.1 10*3/uL (ref 0.0–0.1)
Basophils Relative: 1 %
Eosinophils Absolute: 0.1 10*3/uL (ref 0.0–0.5)
Eosinophils Relative: 2 %
HCT: 32.3 % — ABNORMAL LOW (ref 39.0–52.0)
Hemoglobin: 9.1 g/dL — ABNORMAL LOW (ref 13.0–17.0)
Immature Granulocytes: 1 %
Lymphocytes Relative: 13 %
Lymphs Abs: 0.8 10*3/uL (ref 0.7–4.0)
MCH: 26.1 pg (ref 26.0–34.0)
MCHC: 28.2 g/dL — ABNORMAL LOW (ref 30.0–36.0)
MCV: 92.8 fL (ref 80.0–100.0)
Monocytes Absolute: 0.6 10*3/uL (ref 0.1–1.0)
Monocytes Relative: 10 %
Neutro Abs: 4.7 10*3/uL (ref 1.7–7.7)
Neutrophils Relative %: 73 %
Platelets: 85 10*3/uL — ABNORMAL LOW (ref 150–400)
RBC: 3.48 MIL/uL — ABNORMAL LOW (ref 4.22–5.81)
RDW: 19.7 % — ABNORMAL HIGH (ref 11.5–15.5)
WBC: 6.4 10*3/uL (ref 4.0–10.5)
nRBC: 0 % (ref 0.0–0.2)

## 2021-07-25 LAB — MAGNESIUM: Magnesium: 2.2 mg/dL (ref 1.7–2.4)

## 2021-07-25 LAB — CBG MONITORING, ED
Glucose-Capillary: 61 mg/dL — ABNORMAL LOW (ref 70–99)
Glucose-Capillary: 84 mg/dL (ref 70–99)
Glucose-Capillary: 109 mg/dL — ABNORMAL HIGH (ref 70–99)
Glucose-Capillary: 83 mg/dL (ref 70–99)

## 2021-07-25 LAB — CBC
HCT: 31.4 % — ABNORMAL LOW (ref 39.0–52.0)
Hemoglobin: 9.1 g/dL — ABNORMAL LOW (ref 13.0–17.0)
MCH: 26.5 pg (ref 26.0–34.0)
MCHC: 29 g/dL — ABNORMAL LOW (ref 30.0–36.0)
MCV: 91.3 fL (ref 80.0–100.0)
Platelets: 84 10*3/uL — ABNORMAL LOW (ref 150–400)
RBC: 3.44 MIL/uL — ABNORMAL LOW (ref 4.22–5.81)
RDW: 19.7 % — ABNORMAL HIGH (ref 11.5–15.5)
WBC: 5.9 10*3/uL (ref 4.0–10.5)
nRBC: 0 % (ref 0.0–0.2)

## 2021-07-25 LAB — CREATININE, SERUM
Creatinine, Ser: 5.59 mg/dL — ABNORMAL HIGH (ref 0.61–1.24)
GFR, Estimated: 11 mL/min — ABNORMAL LOW (ref >60)

## 2021-07-25 LAB — BRAIN NATRIURETIC PEPTIDE: B Natriuretic Peptide: 424 pg/mL — ABNORMAL HIGH (ref 0.0–100.0)

## 2021-07-25 MED ORDER — ACETAMINOPHEN 325 MG PO TABS
650.0000 mg | ORAL_TABLET | Freq: Four times a day (QID) | ORAL | Status: DC | PRN
  Administered 2021-07-25: 650 mg via ORAL
  Filled 2021-07-25: qty 2

## 2021-07-25 MED ORDER — ONDANSETRON HCL 4 MG/2ML IJ SOLN
4.0000 mg | Freq: Four times a day (QID) | INTRAMUSCULAR | Status: DC | PRN
  Administered 2021-07-26: 4 mg via INTRAVENOUS
  Filled 2021-07-25: qty 2

## 2021-07-25 MED ORDER — MORPHINE SULFATE (PF) 2 MG/ML IV SOLN
2.0000 mg | INTRAVENOUS | Status: DC | PRN
  Administered 2021-07-25 – 2021-07-26 (×3): 2 mg via INTRAVENOUS
  Filled 2021-07-25 (×3): qty 1

## 2021-07-25 MED ORDER — DEXTROSE 50 % IV SOLN
1.0000 | Freq: Once | INTRAVENOUS | Status: DC
  Filled 2021-07-25: qty 50

## 2021-07-25 MED ORDER — PANTOPRAZOLE SODIUM 40 MG PO TBEC
40.0000 mg | DELAYED_RELEASE_TABLET | Freq: Every day | ORAL | Status: DC
  Administered 2021-07-26 – 2021-07-27 (×2): 40 mg via ORAL
  Filled 2021-07-25 (×2): qty 1

## 2021-07-25 MED ORDER — PREDNISONE 20 MG PO TABS: 10.0000 mg | ORAL_TABLET | Freq: Every day | ORAL | Status: DC

## 2021-07-25 MED ORDER — SEVELAMER CARBONATE 800 MG PO TABS
800.0000 mg | ORAL_TABLET | Freq: Two times a day (BID) | ORAL | Status: DC
  Filled 2021-07-25 (×4): qty 1

## 2021-07-25 MED ORDER — FENTANYL CITRATE PF 50 MCG/ML IJ SOSY
50.0000 ug | PREFILLED_SYRINGE | Freq: Once | INTRAMUSCULAR | Status: AC
  Administered 2021-07-25: 50 ug via INTRAVENOUS
  Filled 2021-07-25: qty 1

## 2021-07-25 MED ORDER — ACETAMINOPHEN 650 MG RE SUPP: 650.0000 mg | Freq: Four times a day (QID) | RECTAL | Status: DC | PRN

## 2021-07-25 MED ORDER — HYDROMORPHONE HCL 1 MG/ML IJ SOLN
0.5000 mg | Freq: Once | INTRAMUSCULAR | Status: AC
  Administered 2021-07-25: 0.5 mg via INTRAVENOUS
  Filled 2021-07-25: qty 0.5

## 2021-07-25 MED ORDER — FERROUS SULFATE 325 (65 FE) MG PO TABS
325.0000 mg | ORAL_TABLET | Freq: Two times a day (BID) | ORAL | Status: DC
  Administered 2021-07-26 – 2021-07-27 (×3): 325 mg via ORAL
  Filled 2021-07-25 (×4): qty 1

## 2021-07-25 MED ORDER — APIXABAN 5 MG PO TABS
5.0000 mg | ORAL_TABLET | Freq: Two times a day (BID) | ORAL | Status: DC
  Administered 2021-07-25 – 2021-07-27 (×5): 5 mg via ORAL
  Filled 2021-07-25 (×5): qty 1

## 2021-07-25 MED ORDER — ONDANSETRON HCL 4 MG PO TABS: 4.0000 mg | ORAL_TABLET | Freq: Four times a day (QID) | ORAL | Status: DC | PRN

## 2021-07-25 NOTE — ED Notes (Signed)
Apple juice given.  

## 2021-07-25 NOTE — ED Provider Notes (Signed)
[facility] EMERGENCY DEPARTMENT Provider Note   CSN: [id number] Arrival date & time: 07/25/21  1316     History  Chief Complaint  Patient presents with   Leg Pain    [name] is a 51 y.o. male.  HPI  Medical history including lupus, end-stage renal disease on dialysis Tuesday Thursday Saturday a flutter currently on Eliquis, pulmonary hypertension, failed renal transplant, bacteremia secondary due to MRSA presents with chief complaint of significant left knee pain, states that started yesterday, came on suddenly, he denies any injury to the area, pain is on the anterior aspect of the patella as well as the medial aspect of the fibula, does not radiate, remains constant, denies any calf tenderness or worsening leg swelling, states he never had this in the past.  He denies any other complaints, he denies any chest pain shortness of breath general body aches no nausea vomiting, states that he missed his dialysis today because of the pain, he states he got dialysis yesterday, he missed it on Tuesday, but got his full treatment last Saturday.   Home Medications Prior to Admission medications   Medication Sig Start Date End Date Taking? Authorizing Provider  apixaban (ELIQUIS) 5 MG TABS tablet Take by mouth. 06/12/20   [provider]  calcitRIOL (ROCALTROL) 0.25 MCG capsule Take by mouth. 10/18/20   [provider]  ERGOCALCIFEROL PO Take by mouth. 07/25/20   [provider]  ferrous sulfate 324 (65 FE) MG TBEC Take 1 tablet (325 mg total) by mouth 2 (two) times daily. 12/10/14   Vaughan Basta, MD  HYDROcodone-acetaminophen (NORCO) 10-325 MG tablet Take 1 tablet by mouth every 8 (eight) hours as needed. 10/23/20   Bayard Hugger, NP  loperamide (IMODIUM) 2 MG capsule Take 1 capsule (2 mg total) by mouth every 6 (six) hours as needed for diarrhea or loose stools. For 3 to 5 days, then stop 12/17/19   Hongalgi, Lenis Dickinson, MD  pantoprazole (PROTONIX) 40 MG  tablet TAKE ONE TABLET BY MOUTH ONCE EVERY DAY TO CONTROL STOMACH ACID 01/26/20   [provider]  predniSONE (DELTASONE) 10 MG tablet Take 10 mg by mouth daily. 11/02/19   [provider]  promethazine (PHENERGAN) 25 MG tablet Take 25 mg by mouth every 6 (six) hours as needed for nausea or vomiting.    [provider]  sevelamer carbonate (RENVELA) 800 MG tablet Take 1 tablet (800 mg total) by mouth 2 (two) times daily with a meal. 12/17/19   Hongalgi, Lenis Dickinson, MD  sevelamer carbonate (RENVELA) 800 MG tablet TAKE TWO TABLETS BY MOUTH THREE TIMES A DAY BEFORE MEALS 10/18/20   [provider]  sildenafil (VIAGRA) 50 MG tablet TAKE ONE-HALF TABLET BY MOUTH EVERY 8 HOURS FOR PULMONARY HYPERTENSION 01/26/20   [provider]  tamsulosin (FLOMAX) 0.4 MG CAPS capsule Once per day on Sun Mon Wed Fri (i.e. nonhemodialysis days). 12/17/19   Hongalgi, Lenis Dickinson, MD  traZODone (DESYREL) 50 MG tablet Take 50 mg by mouth at bedtime. Patient not taking: Reported on 10/23/2020    [provider]      Allergies    Ciprofloxacin, Dapsone, Ketorolac tromethamine, Methadone, Nsaids, Oxycodone, Other, Sulfa antibiotics, and Sulfamethoxazole    Review of Systems   Review of Systems  Constitutional:  Negative for chills and fever.  Respiratory:  Negative for shortness of breath.   Cardiovascular:  Negative for chest pain.  Gastrointestinal:  Negative for abdominal pain.  Musculoskeletal:  Left knee pain  Neurological:  Negative for headaches.    Physical Exam Updated Vital Signs BP 103/64   Pulse 82   Temp 98.2 F (36.8 C) (Oral)   Resp 17   Ht 6\' 2"  (1.88 m)   Wt 84.3 kg   SpO2 100%   BMI 23.86 kg/m  Physical Exam Vitals and nursing note reviewed.  Constitutional:      General: He is not in acute distress.    Appearance: He is not ill-appearing.  HENT:     Head: Normocephalic and atraumatic.     Nose: No congestion.     Mouth/Throat:      Mouth: Mucous membranes are moist.     Pharynx: Oropharynx is clear.  Eyes:     Conjunctiva/sclera: Conjunctivae normal.  Cardiovascular:     Rate and Rhythm: Normal rate and regular rhythm.     Pulses: Normal pulses.     Heart sounds: No murmur heard.    No friction rub. No gallop.  Pulmonary:     Effort: No respiratory distress.     Breath sounds: No wheezing, rhonchi or rales.  Abdominal:     Palpations: Abdomen is soft.     Tenderness: There is no abdominal tenderness. There is no right CVA tenderness or left CVA tenderness.  Musculoskeletal:     Comments: 2+ pitting edema up to the shins bilaterally.  No overlying skin changes, he had no deform the left knee, no crepitus deformities noted no joint laxity, he has full range of motion of toes ankle knee, point tenderness on the medial aspect of the distal left fibula without fluctuance or induration present.  Skin:    General: Skin is warm and dry.     Comments: Patient's upper and lower extremities were visualized as well as his back and abdomen and buttocks no evidence of skin breakdown.  Fistula noted in the left AC good palpable thrill without overlying infection  Neurological:     Mental Status: He is alert.  Psychiatric:        Mood and Affect: Mood normal.     ED Results / Procedures / Treatments   Labs (all labs ordered are listed, but only abnormal results are displayed) Labs Reviewed  COMPREHENSIVE METABOLIC PANEL - Abnormal; Notable for the following components:      Result Value   Glucose, Bld 62 (*)    BUN 32 (*)    Creatinine, Ser 5.32 (*)    Albumin 3.1 (*)    Alkaline Phosphatase 147 (*)    GFR, Estimated 12 (*)    All other components within normal limits  CBC WITH DIFFERENTIAL/PLATELET - Abnormal; Notable for the following components:   RBC 3.48 (*)    Hemoglobin 9.1 (*)    HCT 32.3 (*)    MCHC 28.2 (*)    RDW 19.7 (*)    Platelets 85 (*)    All other components within normal limits  BRAIN  NATRIURETIC PEPTIDE - Abnormal; Notable for the following components:   B Natriuretic Peptide 424.0 (*)    All other components within normal limits  CBG MONITORING, ED - Abnormal; Notable for the following components:   Glucose-Capillary 61 (*)    All other components within normal limits  CBG MONITORING, ED - Abnormal; Notable for the following components:   Glucose-Capillary 109 (*)    All other components within normal limits  MAGNESIUM  CBG MONITORING, ED    EKG None  Radiology US Venous Img  Lower Unilateral Left  Result Date: 07/25/2021 CLINICAL DATA:  Left leg pain and swelling EXAM: LEFT LOWER EXTREMITY VENOUS DOPPLER ULTRASOUND TECHNIQUE: Gray-scale sonography with compression, as well as color and duplex ultrasound, were performed to evaluate the deep venous system(s) from the level of the common femoral vein through the popliteal and proximal calf veins. COMPARISON:  None Available. FINDINGS: VENOUS Normal compressibility of the common femoral, superficial femoral, and popliteal veins, as well as the visualized calf veins. Visualized portions of profunda femoral vein and great saphenous vein unremarkable. No filling defects to suggest DVT on grayscale or color Doppler imaging. Doppler waveforms show normal direction of venous flow, normal respiratory plasticity and response to augmentation. Limited views of the contralateral common femoral vein are unremarkable. OTHER None. Limitations: none IMPRESSION: 1. No evidence of deep venous thrombosis within the left lower extremity. Electronically Signed   By: Randa Ngo M.D.   On: 07/25/2021 15:57   DG Chest Portable 1 View  Result Date: 07/25/2021 CLINICAL DATA:  Weakness, hypotension EXAM: PORTABLE CHEST 1 VIEW COMPARISON:  11/29/2019 FINDINGS: Single frontal view of the chest demonstrates stable enlargement of the cardiac silhouette. No acute airspace disease, effusion, or pneumothorax. No acute bony abnormalities. Diffuse  atherosclerosis. IMPRESSION: 1. Stable chest, no acute process. Electronically Signed   By: Randa Ngo M.D.   On: 07/25/2021 15:19   DG Knee Complete 4 Views Left  Result Date: 07/25/2021 CLINICAL DATA:  Weakness, left knee pain EXAM: LEFT KNEE - COMPLETE 4+ VIEW COMPARISON:  None Available. FINDINGS: Frontal, bilateral oblique, and lateral views of the left knee are obtained. No acute fracture, subluxation, or dislocation. Joint spaces are relatively well preserved. No joint effusion. Soft tissues are unremarkable. Diffuse atherosclerosis. IMPRESSION: 1. Unremarkable left knee. Electronically Signed   By: Randa Ngo M.D.   On: 07/25/2021 15:18    Procedures Procedures    Medications Ordered in ED Medications  dextrose 50 % solution 50 mL (0 mLs Intravenous Hold 07/25/21 1851)  fentaNYL (SUBLIMAZE) injection 50 mcg (50 mcg Intravenous Given 07/25/21 1539)  HYDROmorphone (DILAUDID) injection 0.5 mg (0.5 mg Intravenous Given 07/25/21 1639)    ED Course/ Medical Decision Making/ A&P                           Medical Decision Making Amount and/or Complexity of Data Reviewed Labs: ordered. Radiology: ordered.  Risk Prescription drug management.   This patient presents to the ED for concern of left knee pain, this involves an extensive number of treatment options, and is a complaint that carries with it a high risk of complications and morbidity.  The differential diagnosis includes fracture, dislocation, DVT, cellulitis    Additional history obtained:  Additional history obtained from N/A External records from outside source obtained and reviewed including previous [facility] discharge paperwork   Co morbidities that complicate the patient evaluation  End-stage renal disease, on anticoagulant, diabetes  Social Determinants of Health:  N/A    Lab Tests:  I Ordered, and personally interpreted labs.  The pertinent results include: CBC shows normocytic anemia hemoglobin 9.1, CMP  shows glucose 62 BUN 32 creatinine 5, albumin 3.1, magnesium 2.2, BNP 424   Imaging Studies ordered:  I ordered imaging studies including chest x-ray, x-ray of left knee, DVT study I independently visualized and interpreted imaging which showed DJD of chest as well as knee both negative acute findings, DVT study is negative I agree with the radiologist interpretation  Cardiac Monitoring:  The patient was maintained on a cardiac monitor.  I personally viewed and interpreted the cardiac monitored which showed an underlying rhythm of: Without signs of ischemia   Medicines ordered and prescription drug management:  I ordered medication including fentanyl for pain I have reviewed the patients home medicines and have made adjustments as needed  Critical Interventions:  N/A   Reevaluation:  Presents with left knee pain, he had a benign physical exam, will obtain x-ray as well as DVT study for further evaluation, also obtain basic lab work-up for further assessment.  CMP shows glucose of 62, will provide patient with apple juice and food and reassess.  On repeat it was 61, I ordered amp of D50, patient is refusing, he states he wants more apple juice, on repeat CBG is at 82.  Patient is reassessed, he was placed on 2 L via nasal cannula, asked patient if he is on oxygen normally says no, states he only is on it when he is at dialysis in the hospital.  I turned off the oxygen and he continues to be hypoxic, will consult with nephrology  Reassessed patient updated on recommendation from nephrology he is agreement this plan will consult hospitalist for admission.    Consultations Obtained:  I requested consultation with the spoke with Dr Hollie Salk.,  and discussed lab and imaging findings as well as pertinent plan - they recommend: Agrees that patient will likely need dialysis, would recommend hospital admission. Spoke with Dr. Josephine Cables will admit the patient.    Test  Considered:  N/A    Rule out I have low suspicion for septic arthritis as patient denies IV drug use, skin exam was performed no erythematous, edematous, warm joints noted on exam, no new heart murmur heard on exam.  Low suspicion for fracture or dislocation as x-ray does not feel any significant findings. low suspicion for ligament or tendon damage as area was palpated no gross defects noted, they had full range of motion.  Low suspicion for DVT DVT study is negative.  Low suspicion for compartment syndrome as area was palpated it was soft to the touch, neurovascular fully intact.  I have low suspicion for PE denies prior chest pain, has not missed any doses of Eliquis, he is noted to be hypoxic by suspect this likely secondary due to volume overloaded.     Dispostion and problem list  After consideration of the diagnostic results and the patients response to treatment, I feel that the patent would benefit from admission.  Hypoxia-secondary due to volume overload from missed dialysis, patient been from dialysis treatment which she will receive tomorrow morning. Hypoglycemia-unclear etiology possible likely from not eating or drinking, patient need further observation. Knee pain unclear etiology, will recommend continued pain management, and possible consultation with orthopedic if necessary.            Final Clinical Impression(s) / ED Diagnoses Final diagnoses:  Acute respiratory failure with hypoxia (HCC)  Other hypervolemia  Acute pain of left knee    Rx / DC Orders ED Discharge Orders     None         Aron Baba 07/25/21 Bertell Maria, MD 07/26/21 1706

## 2021-07-25 NOTE — H&P (Addendum)
History and Physical    Patient: [name] [id number]:[id number] DOB: July 09, 1970 DOA: 07/25/2021 DOS: the patient was seen and examined on 07/25/2021 PCP: [facility]  Patient coming from: [facility] [place]  Chief Complaint:  Chief Complaint  Patient presents with   Leg Pain   HPI: [name] is a 51 y.o. male with medical history significant of SLE with lupus nephritis and ESRD on HD (TTS), chronic renal transplant rejection (2009), atrial flutter on Eliquis, pulmonary hypertension who presents to the emergency department due to 1 day of sudden onset of left knee pain, pain was in the patella area and medial thigh.  Pain was nonradiating, but was persistent.  He denied any fall, history of gout or pseudogout.  Patient missed his dialysis on Tuesday (2 days ago) due to feeling sick, but he had dialysis done yesterday (7/5).  He went for dialysis this morning, but was unable to dialyze due to the left knee pain, so, EMS was activated and he was taken to the ED for further evaluation and management.  He denies fever, chills, chest pain, shortness of breath, nausea or vomiting.   ED Course:  In the emergency department, BP was soft at 92/63, but other vital signs were within normal range.  Work-up in the ED showed normocytic anemia, normal BMP except for BUN/creatinine 32/5.32, eGFR 12, CBG 62. Left lower extremity venous Doppler ultrasound showed no evidence of DVT within the left lower extremity Chest x-ray showed stable chest, no acute process Left knee x-ray showed unremarkable left knee IV fentanyl 50 mcg and IV Dilaudid 0.5 mg were given. Nephrologist was consulted and recommended admitting patient for dialysis tomorrow.  Hospitalist was asked to admit patient for further evaluation and management.  Review of Systems: Review of systems as noted in the HPI. All other systems reviewed and are negative.   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 His smoking use included cigarettes. He has a 6.50 pack-year smoking history. He has never used smokeless tobacco. He reports current alcohol use. He reports that he does not use drugs.   Allergies  Allergen Reactions   Ciprofloxacin Rash    skin on hands peeled off "in sheets." Other reaction(s): HIVES   Dapsone    Ketorolac Tromethamine     Other reaction(s): Serum creatinine raised   Methadone     Other reaction(s): IRREGULAR HEART RATE   Nsaids     Other reaction(s): Serum creatinine raised   Oxycodone     Other reaction(s): Drowsy   Other Rash   Sulfa Antibiotics Rash   Sulfamethoxazole Rash    Family History  Problem Relation Age of Onset   Hypertension Other    Brain cancer Mother        Died at age 76   Aneurysm Mother    CAD Father    Heart attack Father 56     Prior to Admission medications   Medication Sig Start Date End Date Taking? Authorizing Provider  apixaban (ELIQUIS) 5 MG TABS tablet Take by mouth. 06/12/20   [provider]  calcitRIOL (ROCALTROL) 0.25 MCG capsule Take by mouth. 10/18/20  [provider]  ERGOCALCIFEROL PO Take by mouth. 07/25/20   [provider]  ferrous sulfate 324 (65 FE) MG TBEC Take 1 tablet (325 mg total) by mouth 2 (two) times daily. 12/10/14   Vaughan Basta, MD  HYDROcodone-acetaminophen (NORCO) 10-325 MG tablet Take 1 tablet by mouth every 8 (eight) hours as needed. 10/23/20   Bayard Hugger, NP  loperamide (IMODIUM) 2 MG capsule Take 1 capsule (2 mg total) by mouth every 6 (six) hours as needed for diarrhea or loose stools. For 3 to 5 days, then stop 12/17/19   Hongalgi, Lenis Dickinson, MD  pantoprazole (PROTONIX) 40 MG tablet TAKE ONE TABLET BY  MOUTH ONCE EVERY DAY TO CONTROL STOMACH ACID 01/26/20   [provider]  predniSONE (DELTASONE) 10 MG tablet Take 10 mg by mouth daily. 11/02/19   [provider]  promethazine (PHENERGAN) 25 MG tablet Take 25 mg by mouth every 6 (six) hours as needed for nausea or vomiting.    [provider]  sevelamer carbonate (RENVELA) 800 MG tablet Take 1 tablet (800 mg total) by mouth 2 (two) times daily with a meal. 12/17/19   Hongalgi, Lenis Dickinson, MD  sevelamer carbonate (RENVELA) 800 MG tablet TAKE TWO TABLETS BY MOUTH THREE TIMES A DAY BEFORE MEALS 10/18/20   [provider]  sildenafil (VIAGRA) 50 MG tablet TAKE ONE-HALF TABLET BY MOUTH EVERY 8 HOURS FOR PULMONARY HYPERTENSION 01/26/20   [provider]  tamsulosin (FLOMAX) 0.4 MG CAPS capsule Once per day on Sun Mon Wed Fri (i.e. nonhemodialysis days). 12/17/19   Hongalgi, Lenis Dickinson, MD  traZODone (DESYREL) 50 MG tablet Take 50 mg by mouth at bedtime. Patient not taking: Reported on 10/23/2020    [provider]    Physical Exam: BP 117/66 (BP Location: Right Arm)   Pulse 78   Temp 98 F (36.7 C) (Oral)   Resp 14   Ht $R'6\' 2"'Mq$  (1.88 m)   Wt 84.3 kg   SpO2 97%   BMI 23.86 kg/m   General: 51 y.o. year-old male well developed well nourished in no acute distress.  Alert and oriented x3. HEENT: NCAT, EOMI Neck: Supple, trachea medial Cardiovascular: Irregular rate and rhythm with no rubs or gallops.  No thyromegaly or JVD noted. +2 lower extremity edema. 2/4 pulses in all 4 extremities. Respiratory: Clear to auscultation with no wheezes or rales. Good inspiratory effort. Abdomen: Soft, nontender nondistended with normal bowel sounds x4 quadrants. Muskuloskeletal: No cyanosis or clubbing noted bilaterally Neuro: CN II-XII intact, strength 5/5 x 4, sensation, reflexes intact Skin: No ulcerative lesions noted or rashes Psychiatry: Mood is appropriate for condition and setting          Labs on Admission:   Basic Metabolic Panel: Recent Labs  Lab 07/25/21 1449  NA 138  K 4.1  CL 98  CO2 29  GLUCOSE 62*  BUN 32*  CREATININE 5.32*  CALCIUM 9.3  MG 2.2   Liver Function Tests: Recent Labs  Lab 07/25/21 1449  AST 15  ALT 9  ALKPHOS 147*  BILITOT 1.2  PROT 7.7  ALBUMIN 3.1*   No results for input(s): "LIPASE", "AMYLASE" in the last 168 hours. No results for input(s): "AMMONIA" in the last 168 hours. CBC: Recent Labs  Lab 07/25/21 1449 07/25/21 2152  WBC 6.4 5.9  NEUTROABS 4.7  --   HGB 9.1* 9.1*  HCT 32.3* 31.4*  MCV 92.8 91.3  PLT 85* 84*   Cardiac Enzymes: No results for  input(s): "CKTOTAL", "CKMB", "CKMBINDEX", "TROPONINI" in the last 168 hours.  BNP (last 3 results) Recent Labs    07/25/21 1449  BNP 424.0*    ProBNP (last 3 results) No results for input(s): "PROBNP" in the last 8760 hours.  CBG: Recent Labs  Lab 07/25/21 1756 07/25/21 1813 07/25/21 1849 07/25/21 2002  GLUCAP 61* 84 109* 83    Radiological Exams on Admission: US Venous Img Lower Unilateral Left  Result Date: 07/25/2021 CLINICAL DATA:  Left leg pain and swelling EXAM: LEFT LOWER EXTREMITY VENOUS DOPPLER ULTRASOUND TECHNIQUE: Gray-scale sonography with compression, as well as color and duplex ultrasound, were performed to evaluate the deep venous system(s) from the level of the common femoral vein through the popliteal and proximal calf veins. COMPARISON:  None Available. FINDINGS: VENOUS Normal compressibility of the common femoral, superficial femoral, and popliteal veins, as well as the visualized calf veins. Visualized portions of profunda femoral vein and great saphenous vein unremarkable. No filling defects to suggest DVT on grayscale or color Doppler imaging. Doppler waveforms show normal direction of venous flow, normal respiratory plasticity and response to augmentation. Limited views of the contralateral common femoral vein are unremarkable. OTHER None. Limitations: none IMPRESSION:  1. No evidence of deep venous thrombosis within the left lower extremity. Electronically Signed   By: Randa Ngo M.D.   On: 07/25/2021 15:57   DG Chest Portable 1 View  Result Date: 07/25/2021 CLINICAL DATA:  Weakness, hypotension EXAM: PORTABLE CHEST 1 VIEW COMPARISON:  11/29/2019 FINDINGS: Single frontal view of the chest demonstrates stable enlargement of the cardiac silhouette. No acute airspace disease, effusion, or pneumothorax. No acute bony abnormalities. Diffuse atherosclerosis. IMPRESSION: 1. Stable chest, no acute process. Electronically Signed   By: Randa Ngo M.D.   On: 07/25/2021 15:19   DG Knee Complete 4 Views Left  Result Date: 07/25/2021 CLINICAL DATA:  Weakness, left knee pain EXAM: LEFT KNEE - COMPLETE 4+ VIEW COMPARISON:  None Available. FINDINGS: Frontal, bilateral oblique, and lateral views of the left knee are obtained. No acute fracture, subluxation, or dislocation. Joint spaces are relatively well preserved. No joint effusion. Soft tissues are unremarkable. Diffuse atherosclerosis. IMPRESSION: 1. Unremarkable left knee. Electronically Signed   By: Randa Ngo M.D.   On: 07/25/2021 15:18    EKG: I independently viewed the EKG done and my findings are as followed: Atrial fibrillation with rate control  Assessment/Plan Present on Admission:  GERD (gastroesophageal reflux disease)  Hypoglycemia  Principal Problem:   Left knee pain Active Problems:   Hypoglycemia   GERD (gastroesophageal reflux disease)   ESRD on hemodialysis (HCC)   Elevated brain natriuretic peptide (BNP) level   Iron deficiency anemia  Left knee pain Patient believes that his left knee pain may be related to his lupus flareup Left knee x-ray and left lower extremity ultrasound showed no abnormality Continue prednisone per home regimen Continue Tylenol as needed for fever/mild pain Continue IV morphine 2 mg every 4 hours as needed for moderate/severe pain Continue fall precaution  neurochecks Consult PT/OT eval and treat  Hypoglycemia Orange juice was given and there was improvement in patient's blood glucose level Continue CBG monitoring  Elevated BNP BNP 424, this may be due to patient's kidney status and possible fluid overload Nephrology was consulted by ED PA and will see patient in the morning for possible maintenance dialysis  ESRD on HD (TTS) Nephrology already consulted for possible dialysis in the morning Continue Renvela  Atrial flutter/A-fib on Eliquis Continue Eliquis Patient was  not on any rate control medication  Iron deficiency anemia Continue ferrous sulfate  GERD Continue Protonix  DVT prophylaxis: Eliquis  Code Status: Full code  Consults: Nephrology (by ED PA)  Family Communication: None at bedside  Severity of Illness: The appropriate patient status for this patient is INPATIENT. Inpatient status is judged to be reasonable and necessary in order to provide the required intensity of service to ensure the patient's safety. The patient's presenting symptoms, physical exam findings, and initial radiographic and laboratory data in the context of their chronic comorbidities is felt to place them at high risk for further clinical deterioration. Furthermore, it is not anticipated that the patient will be medically stable for discharge from the hospital within 2 midnights of admission.   * I certify that at the point of admission it is my clinical judgment that the patient will require inpatient hospital care spanning beyond 2 midnights from the point of admission due to high intensity of service, high risk for further deterioration and high frequency of surveillance required.*  Author: Bernadette Hoit, DO 07/25/2021 10:20 PM  For on call review [email or web address].

## 2021-07-25 NOTE — ED Triage Notes (Signed)
Pt to the ED with [facility] from dialysis in [place]. The pt was unable to get his treatment today due to extreme upper left leg pain.  The pt is from the [facility].

## 2021-07-25 NOTE — ED Notes (Signed)
Patient given water at this time.  

## 2021-07-26 DIAGNOSIS — K219 Gastro-esophageal reflux disease without esophagitis: Secondary | ICD-10-CM | POA: Diagnosis not present

## 2021-07-26 DIAGNOSIS — R7989 Other specified abnormal findings of blood chemistry: Secondary | ICD-10-CM | POA: Diagnosis not present

## 2021-07-26 DIAGNOSIS — N186 End stage renal disease: Secondary | ICD-10-CM | POA: Diagnosis not present

## 2021-07-26 DIAGNOSIS — M25562 Pain in left knee: Secondary | ICD-10-CM | POA: Diagnosis not present

## 2021-07-26 DIAGNOSIS — L89301 Pressure ulcer of unspecified buttock, stage 1: Secondary | ICD-10-CM

## 2021-07-26 DIAGNOSIS — L8990 Pressure ulcer of unspecified site, unspecified stage: Secondary | ICD-10-CM | POA: Insufficient documentation

## 2021-07-26 LAB — GLUCOSE, CAPILLARY
Glucose-Capillary: 64 mg/dL — ABNORMAL LOW (ref 70–99)
Glucose-Capillary: 84 mg/dL (ref 70–99)
Glucose-Capillary: 56 mg/dL — ABNORMAL LOW (ref 70–99)
Glucose-Capillary: 84 mg/dL (ref 70–99)

## 2021-07-26 LAB — HIV ANTIBODY (ROUTINE TESTING W REFLEX): HIV Screen 4th Generation wRfx: NONREACTIVE

## 2021-07-26 MED ORDER — HYDROMORPHONE HCL 1 MG/ML IJ SOLN
1.0000 mg | INTRAMUSCULAR | Status: DC | PRN
  Administered 2021-07-26 – 2021-07-27 (×6): 1 mg via INTRAVENOUS
  Filled 2021-07-26 (×6): qty 1

## 2021-07-26 MED ORDER — LIDOCAINE-PRILOCAINE 2.5-2.5 % EX CREA: 1.0000 | TOPICAL_CREAM | CUTANEOUS | Status: DC | PRN

## 2021-07-26 MED ORDER — LIDOCAINE HCL (PF) 1 % IJ SOLN: 5.0000 mL | INTRAMUSCULAR | Status: DC | PRN

## 2021-07-26 MED ORDER — PENTAFLUOROPROP-TETRAFLUOROETH EX AERO: 1.0000 | INHALATION_SPRAY | CUTANEOUS | Status: DC | PRN

## 2021-07-26 MED ORDER — ALTEPLASE 2 MG IJ SOLR: 2.0000 mg | Freq: Once | INTRAMUSCULAR | Status: DC | PRN

## 2021-07-26 MED ORDER — ANTICOAGULANT SODIUM CITRATE 4% (200MG/5ML) IV SOLN: 5.0000 mL | Status: DC | PRN

## 2021-07-26 MED ORDER — ACETAMINOPHEN 500 MG PO TABS
1000.0000 mg | ORAL_TABLET | Freq: Three times a day (TID) | ORAL | Status: DC
  Administered 2021-07-26 – 2021-07-27 (×6): 1000 mg via ORAL
  Filled 2021-07-26 (×6): qty 2

## 2021-07-26 MED ORDER — CHLORHEXIDINE GLUCONATE CLOTH 2 % EX PADS
6.0000 | MEDICATED_PAD | Freq: Every day | CUTANEOUS | Status: DC
  Administered 2021-07-26 – 2021-07-27 (×2): 6 via TOPICAL

## 2021-07-26 MED ORDER — HEPARIN SODIUM (PORCINE) 1000 UNIT/ML DIALYSIS: 1000.0000 [IU] | INTRAMUSCULAR | Status: DC | PRN

## 2021-07-26 MED ORDER — MIDODRINE HCL 5 MG PO TABS
10.0000 mg | ORAL_TABLET | ORAL | Status: DC
  Administered 2021-07-26: 10 mg via ORAL
  Filled 2021-07-26: qty 2

## 2021-07-26 MED ORDER — PREDNISONE 20 MG PO TABS
20.0000 mg | ORAL_TABLET | Freq: Every day | ORAL | Status: DC
  Administered 2021-07-26 – 2021-07-27 (×2): 20 mg via ORAL
  Filled 2021-07-26 (×2): qty 1

## 2021-07-26 NOTE — Evaluation (Signed)
Occupational Therapy Evaluation Patient Details Name: [name] MRN: [id number] DOB: 09-Oct-1970 Today's Date: 07/26/2021   History of Present Illness [name] is a 51 y.o. male with medical history significant of SLE with lupus nephritis and ESRD on HD (TTS), chronic renal transplant rejection (2009), atrial flutter on Eliquis, pulmonary hypertension who presents to the emergency department due to 1 day of sudden onset of left knee pain, pain was in the patella area and medial thigh.  Pain was nonradiating, but was persistent.  He denied any fall, history of gout or pseudogout.  Patient missed his dialysis on Tuesday (2 days ago) due to feeling sick, but he had dialysis done yesterday (7/5).  He went for dialysis this morning, but was unable to dialyze due to the left knee pain, so, EMS was activated and he was taken to the ED for further evaluation and management.  He denies fever, chills, chest pain, shortness of breath, nausea or vomiting (Per MD)   Clinical Impression   Pt agreeable to OT/PT evaluation. Pt reports that he is currently residing at the [facility]. Prior to hospital stay he reports that he was dressing independently at bed level, bathing at bed level, and toileting at bed level.  He stated that he uses a RW for functional mobility and can ambulate 40 feet with RW at baseline. He reports that he uses O2 at baseline "while at rest in bed". Pt refused bed mobilty, functional mobility, and ADLs due to increased left knee pain. Pt. Would benefit from continued skilled OT while in the acute care setting. Discharge plans are below.       Recommendations for follow up therapy are one component of a multi-disciplinary discharge planning process, led by the attending physician.  Recommendations may be updated based on patient status, additional functional criteria and insurance authorization.   Follow Up Recommendations  Skilled nursing-short term rehab (<3 hours/day)     Assistance Recommended at Discharge Frequent or constant Supervision/Assistance  Patient can return home with the following A lot of help with walking and/or transfers;A little help with bathing/dressing/bathroom    Functional Status Assessment  Patient has had a recent decline in their functional status and demonstrates the ability to make significant improvements in function in a reasonable and predictable amount of time.  Equipment Recommendations       Recommendations for Other Services       Precautions / Restrictions Precautions Precautions: Fall Restrictions Weight Bearing Restrictions: No      Mobility Bed Mobility Overal bed mobility: Needs Assistance             General bed mobility comments: Pt refused bed mobility, per clinical judgement pt with require assist for bed mobility    Transfers Overall transfer level: Needs assistance                 General transfer comment: Pt mobility, per clinical judgement pt with require assist for transfers                                                 ADL either performed or assessed with clinical judgement   ADL Overall ADL's : At baseline;Needs assistance/impaired (per pt report)  General ADL Comments: Pt refused and mobility/ ADLs     Vision Baseline Vision/History: 0 No visual deficits Patient Visual Report: No change from baseline Vision Assessment?: No apparent visual deficits     Perception     Praxis      Pertinent Vitals/Pain Pain Assessment Pain Assessment: Faces Faces Pain Scale: Hurts even more Pain Location: L Knee- "inside knee" Pain Descriptors / Indicators: Constant Pain Intervention(s): Limited activity within patient's tolerance, Monitored during session     Hand Dominance Right   Extremity/Trunk Assessment Upper Extremity Assessment Upper Extremity Assessment: Generalized weakness   Lower  Extremity Assessment Lower Extremity Assessment: Defer to PT evaluation       Communication Communication Communication: No difficulties   Cognition Arousal/Alertness: Awake/alert Behavior During Therapy: WFL for tasks assessed/performed Overall Cognitive Status: Within Functional Limits for tasks assessed                                                        Home Living Family/patient expects to be discharged to:: Skilled nursing facility                                        Prior Functioning/Environment Prior Level of Function : Needs assist       Physical Assist : Mobility (physical) Mobility (physical): Gait;Stairs     ADLs Comments: pt reports that he dresses, bathes, and completes toileting at bed level and requires occasional assist        OT Problem List: Decreased strength;Decreased activity tolerance      OT Treatment/Interventions: Self-care/ADL training;Therapeutic exercise;DME and/or AE instruction;Energy conservation;Manual therapy;Therapeutic activities;Patient/family education    OT Goals(Current goals can be found in the care plan section) Acute Rehab OT Goals OT Goal Formulation: With patient Time For Goal Achievement: 08/09/21 Potential to Achieve Goals: Fair ADL Goals Pt Will Perform Grooming: with set-up;sitting Pt Will Perform Upper Body Bathing: with min assist;sitting Pt Will Perform Lower Body Dressing: with min assist;sitting/lateral leans Pt Will Transfer to Toilet: with min assist;bedside commode  OT Frequency: Min 1X/week    Co-evaluation PT/OT/SLP Co-Evaluation/Treatment: Yes Reason for Co-Treatment: To address functional/ADL transfers          AM-PAC OT "6 Clicks" Daily Activity     Outcome Measure Help from another person eating meals?: A Little Help from another person taking care of personal grooming?: A Little Help from another person toileting, which includes using toliet, bedpan,  or urinal?: A Lot Help from another person bathing (including washing, rinsing, drying)?: A Lot Help from another person to put on and taking off regular upper body clothing?: A Little Help from another person to put on and taking off regular lower body clothing?: A Lot 6 Click Score: 15   End of Session Equipment Utilized During Treatment: Oxygen  Activity Tolerance: Patient limited by pain Patient left: in bed;with call bell/phone within reach  OT Visit Diagnosis: Muscle weakness (generalized) (M62.81)                Time: 4098-1191 OT Time Calculation (min): 8 min Charges:  OT General Charges $OT Visit: 1 Visit OT Evaluation $OT Eval Low Complexity: [place], OTR/L 07/26/2021, 9:50 AM

## 2021-07-26 NOTE — Consult Note (Signed)
Reason for Consult: To manage dialysis and dialysis related needs  Referring Physician: Dr Earnest Conroy is an 51 y.o. male.  HPI: Pt is a 55 M with a PMH sig for ESRD on HD, lupus, h/o failed renal transplant, Aflutter on lupus who is now seen in consultation at the request of Dr Dyann Kief for evaluation and recommendations surrounding ESRD and provision of HD.    Pt previously was dialyzing in [facility].  Had an admission to [facility] in May and subsequently was discharged to the [facility] in [place].  Has been dialyzing at [facility] since on TTS schedule.  He didn't attend HD Tuesday d/t feeling sick.  Had some makeup session Wednesday but then Thursday when he came to dialyze his L leg was hurting him and so didn't dialyze.  Came to ED.  Noted to have new O2 requirement, ++ LE edema, ascites.  In this setting we are asked to see.    Pt is not in any distress at the moment.  He reports that he is tired.  No f/c, n/v.  Doesn't feel SOB.    Dialyzes at TTS [facility]   Past Medical History:  Diagnosis Date   Anemia    Collagen vascular disease ([place])    Lupus ([place])    Renal disorder    Renal insufficiency    Renal transplant recipient     Past Surgical History:  Procedure Laterality Date   NEPHRECTOMY TRANSPLANTED ORGAN      Family History  Problem Relation Age of Onset   Hypertension Other    Brain cancer Mother        Died at age 58   Aneurysm Mother    CAD Father    Heart attack Father 21    Social History:  reports that he has quit smoking. His smoking use included cigarettes. He has a 6.50 pack-year smoking history. He has never used smokeless tobacco. He reports current alcohol use. He reports that he does not use drugs.  Allergies:  Allergies  Allergen Reactions   Ciprofloxacin Rash    skin on hands peeled off "in sheets." Other reaction(s): HIVES   Dapsone    Ketorolac Tromethamine     Other reaction(s): Serum creatinine raised   Methadone      Other reaction(s): IRREGULAR HEART RATE   Nsaids     Other reaction(s): Serum creatinine raised   Oxycodone     Other reaction(s): Drowsy   Other Rash   Sulfa Antibiotics Rash   Sulfamethoxazole Rash    Medications: Scheduled:  acetaminophen  1,000 mg Oral TID   apixaban  5 mg Oral BID   Chlorhexidine Gluconate Cloth  6 each Topical Q0600   dextrose  1 ampule Intravenous Once   ferrous sulfate  325 mg Oral BID   pantoprazole  40 mg Oral Daily   predniSONE  20 mg Oral Daily   sevelamer carbonate  800 mg Oral BID WC    Results for orders placed or performed during the hospital encounter of 07/25/21 (from the past 48 hour(s))  Comprehensive metabolic panel     Status: Abnormal   Collection Time: 07/25/21  2:49 PM  Result Value Ref Range   Sodium 138 135 - 145 mmol/L   Potassium 4.1 3.5 - 5.1 mmol/L   Chloride 98 98 - 111 mmol/L   CO2 29 22 - 32 mmol/L   Glucose, Bld 62 (L) 70 - 99 mg/dL    Comment:  Glucose reference range applies only to samples taken after fasting for at least 8 hours.   BUN 32 (H) 6 - 20 mg/dL   Creatinine, Ser 5.32 (H) 0.61 - 1.24 mg/dL   Calcium 9.3 8.9 - 10.3 mg/dL   Total Protein 7.7 6.5 - 8.1 g/dL   Albumin 3.1 (L) 3.5 - 5.0 g/dL   AST 15 15 - 41 U/L   ALT 9 0 - 44 U/L   Alkaline Phosphatase 147 (H) 38 - 126 U/L   Total Bilirubin 1.2 0.3 - 1.2 mg/dL   GFR, Estimated 12 (L) >60 mL/min    Comment: (NOTE) Calculated using the CKD-EPI Creatinine Equation (2021)    Anion gap 11 5 - 15    Comment: Performed at [facility], [address], [place], [place] [address]  CBC with Differential     Status: Abnormal   Collection Time: 07/25/21  2:49 PM  Result Value Ref Range   WBC 6.4 4.0 - 10.5 K/uL   RBC 3.48 (L) 4.22 - 5.81 MIL/uL   Hemoglobin 9.1 (L) 13.0 - 17.0 g/dL   HCT 32.3 (L) 39.0 - 52.0 %   MCV 92.8 80.0 - 100.0 fL   MCH 26.1 26.0 - 34.0 pg   MCHC 28.2 (L) 30.0 - 36.0 g/dL   RDW 19.7 (H) 11.5 - 15.5 %   Platelets 85 (L) 150 - 400 K/uL     Comment: SPECIMEN CHECKED FOR CLOTS Immature Platelet Fraction may be clinically indicated, consider ordering this additional test [id number] REPEATED TO VERIFY PLATELET COUNT CONFIRMED BY SMEAR    nRBC 0.0 0.0 - 0.2 %   Neutrophils Relative % 73 %   Neutro Abs 4.7 1.7 - 7.7 K/uL   Lymphocytes Relative 13 %   Lymphs Abs 0.8 0.7 - 4.0 K/uL   Monocytes Relative 10 %   Monocytes Absolute 0.6 0.1 - 1.0 K/uL   Eosinophils Relative 2 %   Eosinophils Absolute 0.1 0.0 - 0.5 K/uL   Basophils Relative 1 %   Basophils Absolute 0.1 0.0 - 0.1 K/uL   WBC Morphology MORPHOLOGY UNREMARKABLE    RBC Morphology MORPHOLOGY UNREMARKABLE    Immature Granulocytes 1 %   Abs Immature Granulocytes 0.05 0.00 - 0.07 K/uL    Comment: Performed at [facility], [address], [place], [place] [address]  Brain natriuretic peptide     Status: Abnormal   Collection Time: 07/25/21  2:49 PM  Result Value Ref Range   B Natriuretic Peptide 424.0 (H) 0.0 - 100.0 pg/mL    Comment: Performed at [facility], [address], [place], [place] [address]  Magnesium     Status: None   Collection Time: 07/25/21  2:49 PM  Result Value Ref Range   Magnesium 2.2 1.7 - 2.4 mg/dL    Comment: Performed at [facility], [address] [place], [place] [address]  CBG monitoring, ED     Status: Abnormal   Collection Time: 07/25/21  5:56 PM  Result Value Ref Range   Glucose-Capillary 61 (L) 70 - 99 mg/dL    Comment: Glucose reference range applies only to samples taken after fasting for at least 8 hours.  CBG monitoring, ED     Status: None   Collection Time: 07/25/21  6:13 PM  Result Value Ref Range   Glucose-Capillary 84 70 - 99 mg/dL    Comment: Glucose reference range applies only to samples taken after fasting for at least 8 hours.  CBG monitoring, ED  Status: Abnormal   Collection Time: 07/25/21  6:49 PM  Result Value Ref Range   Glucose-Capillary 109 (H) 70 - 99 mg/dL    Comment: Glucose reference range applies  only to samples taken after fasting for at least 8 hours.  CBG monitoring, ED     Status: None   Collection Time: 07/25/21  8:02 PM  Result Value Ref Range   Glucose-Capillary 83 70 - 99 mg/dL    Comment: Glucose reference range applies only to samples taken after fasting for at least 8 hours.  HIV Antibody (routine testing w rflx)     Status: None   Collection Time: 07/25/21  9:52 PM  Result Value Ref Range   HIV Screen 4th Generation wRfx Non Reactive Non Reactive    Comment: Performed at [facility], [place] [address] [place], [place] [address]  CBC     Status: Abnormal   Collection Time: 07/25/21  9:52 PM  Result Value Ref Range   WBC 5.9 4.0 - 10.5 K/uL   RBC 3.44 (L) 4.22 - 5.81 MIL/uL   Hemoglobin 9.1 (L) 13.0 - 17.0 g/dL   HCT 31.4 (L) 39.0 - 52.0 %   MCV 91.3 80.0 - 100.0 fL   MCH 26.5 26.0 - 34.0 pg   MCHC 29.0 (L) 30.0 - 36.0 g/dL   RDW 19.7 (H) 11.5 - 15.5 %   Platelets 84 (L) 150 - 400 K/uL    Comment: SPECIMEN CHECKED FOR CLOTS CONSISTENT WITH PREVIOUS RESULT    nRBC 0.0 0.0 - 0.2 %    Comment: Performed at [facility], [address], [place], [place] [address]  Creatinine, serum     Status: Abnormal   Collection Time: 07/25/21  9:52 PM  Result Value Ref Range   Creatinine, Ser 5.59 (H) 0.61 - 1.24 mg/dL   GFR, Estimated 11 (L) >60 mL/min    Comment: (NOTE) Calculated using the CKD-EPI Creatinine Equation (2021) Performed at [facility], [address] [place], [place] [address]   Glucose, capillary     Status: Abnormal   Collection Time: 07/26/21  1:40 AM  Result Value Ref Range   Glucose-Capillary 64 (L) 70 - 99 mg/dL    Comment: Glucose reference range applies only to samples taken after fasting for at least 8 hours.  Glucose, capillary     Status: None   Collection Time: 07/26/21  5:00 AM  Result Value Ref Range   Glucose-Capillary 84 70 - 99 mg/dL    Comment: Glucose reference range applies only to samples taken after fasting for at least 8  hours.  Glucose, capillary     Status: Abnormal   Collection Time: 07/26/21  7:06 AM  Result Value Ref Range   Glucose-Capillary 56 (L) 70 - 99 mg/dL    Comment: Glucose reference range applies only to samples taken after fasting for at least 8 hours.    US Venous Img Lower Unilateral Left  Result Date: 07/25/2021 CLINICAL DATA:  Left leg pain and swelling EXAM: LEFT LOWER EXTREMITY VENOUS DOPPLER ULTRASOUND TECHNIQUE: Gray-scale sonography with compression, as well as color and duplex ultrasound, were performed to evaluate the deep venous system(s) from the level of the common femoral vein through the popliteal and proximal calf veins. COMPARISON:  None Available. FINDINGS: VENOUS Normal compressibility of the common femoral, superficial femoral, and popliteal veins, as well as the visualized calf veins. Visualized portions of profunda femoral vein and great saphenous vein unremarkable. No filling defects to suggest  DVT on grayscale or color Doppler imaging. Doppler waveforms show normal direction of venous flow, normal respiratory plasticity and response to augmentation. Limited views of the contralateral common femoral vein are unremarkable. OTHER None. Limitations: none IMPRESSION: 1. No evidence of deep venous thrombosis within the left lower extremity. Electronically Signed   By: Randa Ngo M.D.   On: 07/25/2021 15:57   DG Chest Portable 1 View  Result Date: 07/25/2021 CLINICAL DATA:  Weakness, hypotension EXAM: PORTABLE CHEST 1 VIEW COMPARISON:  11/29/2019 FINDINGS: Single frontal view of the chest demonstrates stable enlargement of the cardiac silhouette. No acute airspace disease, effusion, or pneumothorax. No acute bony abnormalities. Diffuse atherosclerosis. IMPRESSION: 1. Stable chest, no acute process. Electronically Signed   By: Randa Ngo M.D.   On: 07/25/2021 15:19   DG Knee Complete 4 Views Left  Result Date: 07/25/2021 CLINICAL DATA:  Weakness, left knee pain EXAM: LEFT KNEE  - COMPLETE 4+ VIEW COMPARISON:  None Available. FINDINGS: Frontal, bilateral oblique, and lateral views of the left knee are obtained. No acute fracture, subluxation, or dislocation. Joint spaces are relatively well preserved. No joint effusion. Soft tissues are unremarkable. Diffuse atherosclerosis. IMPRESSION: 1. Unremarkable left knee. Electronically Signed   By: Randa Ngo M.D.   On: 07/25/2021 15:18    ROS: all other systems reviewed and are negative except as per HPI  Blood pressure (!) 89/66, pulse (!) 48, temperature 98.2 F (36.8 C), temperature source Oral, resp. rate 19, height 6\' 2"  (1.88 m), weight 84.3 kg, SpO2 92 %. GEN  NAD, lying in bed HEENT EOMI PERRL NECK +JVD PULM clear anteriorly, muffled at bases CV RRR ABD soft, + fluid wave with + abd wall edema EXT 3+ LE edema to the knee NEURO AAO x 3 SKIN healed ulcers bilateral legs ACCESS: L AVF + T/B  Assessment/Plan: 1 Acute hypoxic RF: secondary to volume overload and missed HD.  Will do HD here with max UF and then hopefully can d/c to resume normal OP schedule on Saturday (if can be weaned from O2)  2 ESRD: TTS at [facility] 3 Hypertension: BP is soft- adding midodrine to help with max UF 4. Anemia of ESRD: Hgb 9.1 5. Metabolic Bone Disease: Renvela as binder 6.  Aflutter- on Eliquis 7.  Dispo: in pt  Madelon Lips 07/26/2021, 9:42 AM

## 2021-07-26 NOTE — Assessment & Plan Note (Signed)
-  Patient normally on Tuesday, Thursday and Saturday schedule.  Patient needs his dialysis on 07/25/2021 prior to admission secondary to excruciating left knee pain. -Hemodialysis done on 07/26/2021 and repeated again on 07/27/2021 to put patient back on his regular schedule. -Next dialysis treatment 07/30/2021 as an outpatient. -Appreciate assistance and recommendation by nephrology service.

## 2021-07-26 NOTE — Progress Notes (Addendum)
[place] notification complete:  Your notification ID is: [phone]

## 2021-07-26 NOTE — Progress Notes (Signed)
Pt refused blood sugar check. Stated "No. I'm done. I'm not even diabetic. Don't come back in here for nothing until I wake up in the morning." Will continue to monitor.

## 2021-07-26 NOTE — Assessment & Plan Note (Addendum)
Continue PPI ?

## 2021-07-26 NOTE — Plan of Care (Signed)
  Problem: Acute Rehab PT Goals(only PT should resolve) Goal: Pt Will Go Supine/Side To Sit Outcome: Progressing Flowsheets (Taken 07/26/2021 1033) Pt will go Supine/Side to Sit:  with minimal assist  with min guard assist Goal: Pt Will Go Sit To Supine/Side Outcome: Progressing Flowsheets (Taken 07/26/2021 1033) Pt will go Sit to Supine/Side:  with minimal assist  with min guard assist Goal: Patient Will Transfer Sit To/From Stand Outcome: Progressing Flowsheets (Taken 07/26/2021 1033) Patient will transfer sit to/from stand:  with minimal assist  with min guard assist Goal: Pt Will Transfer Bed To Chair/Chair To Bed Outcome: Progressing Flowsheets (Taken 07/26/2021 1033) Pt will Transfer Bed to Chair/Chair to Bed:  with min assist  min guard assist Goal: Pt Will Ambulate Outcome: Progressing Flowsheets (Taken 07/26/2021 1033) Pt will Ambulate:  25 feet  with rolling walker  with moderate assist Goal: Pt/caregiver will Perform Home Exercise Program Outcome: Progressing Flowsheets (Taken 07/26/2021 1033) Pt/caregiver will Perform Home Exercise Program:  For increased strengthening  For improved balance  Independently  10:33 AM, 07/26/21 Mearl Latin PT, DPT Physical Therapist at [facility]

## 2021-07-26 NOTE — Procedures (Signed)
   HEMODIALYSIS TREATMENT NOTE:   Uneventful 3.5 hour heparin-free treatment completed using left upper arm AVF (15g/antegrade). Goal met: 3.5 liters removed without interruption in UF.  All blood was returned and hemostasis was achieved in __ minutes.  No changes from pre-HD assessment.   Rockwell Alexandria, RN

## 2021-07-26 NOTE — Assessment & Plan Note (Signed)
-   No effusion or abnormalities appreciated on acute images checked. -Presumably associated with osteoarthritis versus (less likely) associated with lupus. -Steroids has been increased to 20 mg daily (double dose of his home regimen) for anti-inflammatory effect. -Continue as needed analgesics -Continue physical therapy.

## 2021-07-26 NOTE — Evaluation (Signed)
Physical Therapy Evaluation Patient Details Name: [name] MRN: [id number] DOB: [birth year] Today's Date: 07/26/2021  History of Present Illness  [name] is a 51 y.o. male with medical history significant of SLE with lupus nephritis and ESRD on HD (TTS), chronic renal transplant rejection (2009), atrial flutter on Eliquis, pulmonary hypertension who presents to the emergency department due to 1 day of sudden onset of left knee pain, pain was in the patella area and medial thigh.  Pain was nonradiating, but was persistent.  He denied any fall, history of gout or pseudogout.  Patient missed his dialysis on Tuesday (2 days ago) due to feeling sick, but he had dialysis done yesterday (7/5).  He went for dialysis this morning, but was unable to dialyze due to the left knee pain, so, EMS was activated and he was taken to the ED for further evaluation and management.  He denies fever, chills, chest pain, shortness of breath, nausea or vomiting   Clinical Impression  Patient limited for mobility with c/o fatigue and L knee pain. Patient demonstrating bilateral LE ROM WFL while in supine. He is TTP slightly to L quad but not throughout the rest of LLE or RLE. He is able to complete heel slides in bed without change in symptoms. Patient did not wish to perform any further mobility due to pain and wanting rest. Patient will benefit from continued skilled physical therapy in hospital and recommended venue below to increase strength, balance, endurance for safe ADLs and gait.      Recommendations for follow up therapy are one component of a multi-disciplinary discharge planning process, led by the attending physician.  Recommendations may be updated based on patient status, additional functional criteria and insurance authorization.  Follow Up Recommendations Skilled nursing-short term rehab (<3 hours/day)      Assistance Recommended at Discharge Intermittent Supervision/Assistance  Patient can  return home with the following  A little help with walking and/or transfers;A little help with bathing/dressing/bathroom;Assist for transportation;Assistance with cooking/housework    Equipment Recommendations None recommended by PT  Recommendations for Other Services       Functional Status Assessment Patient has had a recent decline in their functional status and demonstrates the ability to make significant improvements in function in a reasonable and predictable amount of time.     Precautions / Restrictions Precautions Precautions: Fall Restrictions Weight Bearing Restrictions: No      Mobility  Bed Mobility                    Transfers                        Ambulation/Gait                  Stairs            Wheelchair Mobility    Modified Rankin (Stroke Patients Only)       Balance                                             Pertinent Vitals/Pain Pain Assessment Faces Pain Scale: Hurts even more Pain Location: L Knee- "inside knee" Pain Descriptors / Indicators: Constant Pain Intervention(s): Limited activity within patient's tolerance, Monitored during session    Home Living Family/patient expects to be discharged to:: Skilled nursing facility  Prior Function Prior Level of Function : Needs assist       Physical Assist : Mobility (physical) Mobility (physical): Gait;Stairs   Mobility Comments: states in rehab, he could ambulate about 40 feet with RW ADLs Comments: pt reports that he dresses, bathes, and completes toileting at bed level and requires occasional assist     Hand Dominance   Dominant Hand: Right    Extremity/Trunk Assessment   Upper Extremity Assessment Upper Extremity Assessment: Defer to OT evaluation    Lower Extremity Assessment Lower Extremity Assessment: Generalized weakness       Communication   Communication: No difficulties   Cognition Arousal/Alertness: Awake/alert Behavior During Therapy: WFL for tasks assessed/performed Overall Cognitive Status: Within Functional Limits for tasks assessed                                          General Comments      Exercises General Exercises - Lower Extremity Heel Slides: AROM, Left, 10 reps, Supine   Assessment/Plan    PT Assessment Patient needs continued PT services  PT Problem List Decreased strength;Decreased mobility;Decreased range of motion;Decreased activity tolerance;Decreased balance       PT Treatment Interventions DME instruction;Therapeutic activities;Gait training;Therapeutic exercise;Patient/family education;Stair training;Balance training;Functional mobility training;Neuromuscular re-education;Manual techniques    PT Goals (Current goals can be found in the Care Plan section)  Acute Rehab PT Goals Patient Stated Goal: knee to feel better PT Goal Formulation: With patient Time For Goal Achievement: 08/09/21 Potential to Achieve Goals: Fair    Frequency Min 3X/week     Co-evaluation   Reason for Co-Treatment: To address functional/ADL transfers           AM-PAC PT "6 Clicks" Mobility  Outcome Measure Help needed turning from your back to your side while in a flat bed without using bedrails?: None Help needed moving from lying on your back to sitting on the side of a flat bed without using bedrails?: A Little Help needed moving to and from a bed to a chair (including a wheelchair)?: A Lot Help needed standing up from a chair using your arms (e.g., wheelchair or bedside chair)?: A Lot Help needed to walk in hospital room?: A Lot Help needed climbing 3-5 steps with a railing? : A Lot 6 Click Score: 15    End of Session Equipment Utilized During Treatment: Oxygen Activity Tolerance: Patient limited by pain Patient left: in bed;with call bell/phone within reach;with bed alarm set Nurse Communication: Mobility  status PT Visit Diagnosis: Unsteadiness on feet (R26.81);Other abnormalities of gait and mobility (R26.89);Muscle weakness (generalized) (M62.81)    Time: 3382-5053 PT Time Calculation (min) (ACUTE ONLY): 6 min   Charges:   PT Evaluation $PT Eval Low Complexity: 1 Low          10:31 AM, 07/26/21 Mearl Latin PT, DPT Physical Therapist at [facility]

## 2021-07-26 NOTE — Plan of Care (Signed)
  Problem: Acute Rehab OT Goals (only OT should resolve) Goal: Pt. Will Perform Grooming Flowsheets (Taken 07/26/2021 0942) Pt Will Perform Grooming:  with set-up  sitting Goal: Pt. Will Perform Upper Body Bathing Flowsheets (Taken 07/26/2021 0942) Pt Will Perform Upper Body Bathing:  with min assist  sitting Goal: Pt. Will Perform Lower Body Dressing Flowsheets (Taken 07/26/2021 0942) Pt Will Perform Lower Body Dressing:  with min assist  sitting/lateral leans Goal: Pt. Will Transfer To Toilet Flowsheets (Taken 07/26/2021 [phone]) Pt Will Transfer to Toilet:  with min assist  bedside commode  Arvil Persons, OTR/L

## 2021-07-26 NOTE — TOC Initial Note (Signed)
Transition of Care [facility]) - Initial/Assessment Note    Patient Details  Name: [name] MRN: [id number] Date of Birth: 05-Dec-1970  Transition of Care [facility]) CM/SW Contact:    Ihor Gully, LCSW Phone Number: 07/26/2021, 1:11 PM  Clinical Narrative:                 Patient from [facility]. Will return at d/c. On HD TTS schedule. Ebony Hail and [place] with the facility notified that patient is expected to d/c tomorrow and are agreeable to his return.   Expected Discharge Plan: Home w Hospice Care Barriers to Discharge: Continued Medical Work up   Patient Goals and CMS Choice Patient states their goals for this hospitalization and ongoing recovery are:: return to facility.      Expected Discharge Plan and Services Expected Discharge Plan: [place] arrangements for the past 2 months: [place]                                      Prior Living Arrangements/Services Living arrangements for the past 2 months: [place] Lives with:: Facility Resident Patient language and need for interpreter reviewed:: Yes Do you feel safe going back to the place where you live?: Yes               Activities of Daily Living      Permission Sought/Granted Permission sought to share information with : Family Supports    Share Information with NAME: Felipa Eth, facility staff           Emotional Assessment       Orientation: : Oriented to Place, Oriented to Self, Oriented to  Time, Oriented to Situation Alcohol / Substance Use: Not Applicable Psych Involvement: No (comment)  Admission diagnosis:  Acute respiratory failure with hypoxia (HCC) [J96.01] Acute pain of left knee [M25.562] Other hypervolemia [E87.79] Patient Active Problem List   Diagnosis Date Noted   Pressure injury of skin 07/26/2021   Elevated brain natriuretic peptide (BNP) level 07/25/2021   Iron deficiency anemia  07/25/2021   Left knee pain 07/25/2021   Hyperkalemia 12/05/2019   History of Renal transplant failure 12/05/2019   Pseudomonas aeruginosa wound infection 12/05/2019   Systemic lupus erythematosus, unspecified ([place]) 11/30/2019   Paroxysmal atrial fibrillation ([place])    Severe sepsis ([place]) 11/17/2019   Cellulitis of right lower extremity 11/17/2019   Lactic acidosis 11/17/2019   Hypoglycemia 11/17/2019   Anemia    GERD (gastroesophageal reflux disease)    ESRD on hemodialysis ([place])    Murmur 03/25/2018   Laboratory examination 03/25/2018   Tendonitis of both rotator cuffs 12/29/2017   Greater trochanteric bursitis of both hips 12/29/2017   Diverticulitis of large intestine without perforation or abscess without bleeding    Diverticulitis 07/01/2017   Red blood cell antibody positive, 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 09/14/2015   At risk for sepsis 09/08/2015   GI bleed 12/09/2014   PCP:  [facility]   [id number] - [place], [address] [place] [address] Phone: [phone] Fax: [phone]     Social Determinants of Health (SDOH) Interventions    Readmission Risk Interventions    12/06/2019    4:55 PM 11/24/2019    9:36 AM  Readmission Risk Prevention Plan  Transportation Screening Complete  Complete  PCP or Specialist Appt within 3-5 Days  Complete  HRI or Home Care Consult Complete Complete  Social Work Consult for [place] Planning/Counseling Complete Complete  Palliative Care Screening Not Applicable Not Applicable  Medication Review (RN Care Manager)  Referral to Pharmacy

## 2021-07-26 NOTE — Assessment & Plan Note (Signed)
-  Epogen and IV iron as per nephrology discretion. -No overt bleeding -Hemoglobin stable and close to baseline.

## 2021-07-26 NOTE — Assessment & Plan Note (Addendum)
-  Continue constant repositioning -Preventive measures to be followed.

## 2021-07-26 NOTE — Assessment & Plan Note (Signed)
-  will check cortisol level -Advised not to skip meals -Adjustment of his steroids will also help avoiding future low sugar levels.

## 2021-07-26 NOTE — NC FL2 (Signed)
[place] LEVEL OF CARE SCREENING TOOL     IDENTIFICATION  Patient Name: [name] Birthdate: [birth year] Sex: male Admission Date (Current Location): 07/25/2021  [facility] and [place] Number:  [employer] and Address:  [facility] [address] [place]      Provider Number: [id number]  Attending Physician Name and Address:  Barton Dubois, MD  Relative Name and Phone Number:  Kamarie, Pal[place] (Other)   [phone]    Current Level of Care: Hospital Recommended Level of Care: [place] Prior Approval Number:    Date Approved/Denied:   PASRR Number:    Discharge Plan: SNF    Current Diagnoses: Patient Active Problem List   Diagnosis Date Noted   Pressure injury of skin 07/26/2021   Elevated brain natriuretic peptide (BNP) level 07/25/2021   Iron deficiency anemia 07/25/2021   Left knee pain 07/25/2021   Hyperkalemia 12/05/2019   History of Renal transplant failure 12/05/2019   Pseudomonas aeruginosa wound infection 12/05/2019   Systemic lupus erythematosus, unspecified ([place]) 11/30/2019   Paroxysmal atrial fibrillation ([place])    Severe sepsis ([place]) 11/17/2019   Cellulitis of right lower extremity 11/17/2019   Lactic acidosis 11/17/2019   Hypoglycemia 11/17/2019   Anemia    GERD (gastroesophageal reflux disease)    ESRD on hemodialysis ([place])    Murmur 03/25/2018   Laboratory examination 03/25/2018   Tendonitis of both rotator cuffs 12/29/2017   Greater trochanteric bursitis of both hips 12/29/2017   Diverticulitis of large intestine without perforation or abscess without bleeding    Diverticulitis 07/01/2017   Red blood cell antibody positive, 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 09/14/2015   At risk for sepsis 09/08/2015   GI bleed 12/09/2014    Orientation RESPIRATION BLADDER Height & Weight     Self, Time, Situation, Place  Normal Continent Weight: 185 lb 13.6 oz (84.3 kg) Height:  6\' 2"  (188  cm)  BEHAVIORAL SYMPTOMS/MOOD NEUROLOGICAL BOWEL NUTRITION STATUS      Continent Diet (heart healthy, fluid restriction 1863mL fluid)  AMBULATORY STATUS COMMUNICATION OF NEEDS Skin   Extensive Assist Verbally PU Stage and Appropriate Care (Stage I buttocks mid)                       Personal Care Assistance Level of Assistance  Bathing, Dressing, Feeding Bathing Assistance: Limited assistance Feeding assistance: Independent Dressing Assistance: Limited assistance     Functional Limitations Info  Sight, Hearing, Speech Sight Info: Adequate Hearing Info: Adequate Speech Info: Adequate    SPECIAL CARE FACTORS FREQUENCY  PT (By licensed PT), OT (By licensed OT)     PT Frequency: 5x/week OT Frequency: 3x/week            Contractures Contractures Info: Not present    Additional Factors Info  Code Status, Allergies Code Status Info: Full Code Allergies Info: Ciprofloxacin, dapsone, ketorolac, tromethamine, methodone, nsaids, oxycodone, sulfa antibiotics, sulfamethoxazole           Current Medications (07/26/2021):  This is the current hospital active medication list Current Facility-Administered Medications  Medication Dose Route Frequency Provider Last Rate Last Admin   acetaminophen (TYLENOL) tablet 1,000 mg  1,000 mg Oral TID Barton Dubois, MD   1,000 mg at 07/26/21 0946   alteplase (CATHFLO ACTIVASE) injection 2 mg  2 mg Intracatheter Once PRN Madelon Lips, MD       anticoagulant sodium citrate solution 5 mL  5 mL Dialysis PRN Hollie Salk,  Benjamine Mola, MD       apixaban Arne [place]) tablet 5 mg  5 mg Oral BID Adefeso, Oladapo, DO   5 mg at 07/26/21 0946   Chlorhexidine Gluconate Cloth 2 % PADS 6 each  6 each Topical Q0600 Madelon Lips, MD       dextrose 50 % solution 50 mL  1 ampule Intravenous Once Adefeso, Oladapo, DO       ferrous sulfate tablet 325 mg  325 mg Oral BID Adefeso, Oladapo, DO   325 mg at 07/26/21 0743   heparin injection 1,000 Units  1,000 Units  Intracatheter PRN Madelon Lips, MD       HYDROmorphone (DILAUDID) injection 1 mg  1 mg Intravenous Q3H PRN Barton Dubois, MD   1 mg at 07/26/21 0945   lidocaine (PF) (XYLOCAINE) 1 % injection 5 mL  5 mL Intradermal PRN Madelon Lips, MD       lidocaine-prilocaine (EMLA) cream 1 Application  1 Application Topical PRN Madelon Lips, MD       midodrine (PROAMATINE) tablet 10 mg  10 mg Oral Q M,W,F Madelon Lips, MD       ondansetron [facility]) tablet 4 mg  4 mg Oral Q6H PRN Adefeso, Oladapo, DO       Or   ondansetron (ZOFRAN) injection 4 mg  4 mg Intravenous Q6H PRN Adefeso, Oladapo, DO       pantoprazole (PROTONIX) EC tablet 40 mg  40 mg Oral Daily Adefeso, Oladapo, DO   40 mg at 07/26/21 0946   pentafluoroprop-tetrafluoroeth (GEBAUERS) aerosol 1 Application  1 Application Topical PRN Madelon Lips, MD       predniSONE (DELTASONE) tablet 20 mg  20 mg Oral Daily Barton Dubois, MD   20 mg at 07/26/21 9924   sevelamer carbonate (RENVELA) tablet 800 mg  800 mg Oral BID WC Bernadette Hoit, DO         Discharge Medications: Please see discharge summary for a list of discharge medications.  Relevant Imaging Results:  Relevant Lab Results:   Additional Information SSN [id number], HD TTS.  Maretta Overdorf, Clydene Pugh, LCSW

## 2021-07-26 NOTE — Assessment & Plan Note (Signed)
-  No significant complaints of shortness of breath or crackles appreciated on exam. -BNP elevated; difficult evaluation of this isolated metric in a patient that is end-stage renal disease dependent. -Continue to closely follow volume and adjust as needed with dialysis. -Heart healthy/low sodium diet recommended.

## 2021-07-26 NOTE — Progress Notes (Signed)
Progress Note   Patient: [name] CBS:[id number] DOB: [birth year] DOA: 07/25/2021     1 DOS: the patient was seen and examined on 07/26/2021   Brief hospital admission course: As per H&P written by Dr. Josephine Cables on 07/25/2021 [name] is a 51 y.o. male with medical history significant of SLE with lupus nephritis and ESRD on HD (TTS), chronic renal transplant rejection (2009), atrial flutter on Eliquis, pulmonary hypertension who presents to the emergency department due to 1 day of sudden onset of left knee pain, pain was in the patella area and medial thigh.  Pain was nonradiating, but was persistent.  He denied any fall, history of gout or pseudogout.  Patient missed his dialysis on Tuesday (2 days ago) due to feeling sick, but he had dialysis done yesterday (7/5).  He went for dialysis this morning, but was unable to dialyze due to the left knee pain, so, EMS was activated and he was taken to the ED for further evaluation and management.  He denies fever, chills, chest pain, shortness of breath, nausea or vomiting.    ED Course:  In the emergency department, BP was soft at 92/63, but other vital signs were within normal range.  Work-up in the ED showed normocytic anemia, normal BMP except for BUN/creatinine 32/5.32, eGFR 12, CBG 62. Left lower extremity venous Doppler ultrasound showed no evidence of DVT within the left lower extremity Chest x-ray showed stable chest, no acute process Left knee x-ray showed unremarkable left knee IV fentanyl 50 mcg and IV Dilaudid 0.5 mg were given. Nephrologist was consulted and recommended admitting patient for dialysis tomorrow.  Hospitalist was asked to admit patient for further evaluation and management.  Assessment and Plan: * Left knee pain - No effusion appreciated or abnormalities on acute images checked. -Presumably associated with osteoarthritis versus (less likely) associated with lupus. -Steroids has been increased to 20 mg daily (double  dose of his home regimen). -Continue as needed analgesics -Continue physical therapy.  Pressure injury of skin - Continue constant repositioning -Preventive measures to be followed.  Iron deficiency anemia - Epogen and IV iron as per nephrology discretion. -No overt bleeding -Hemoglobin stable and close to baseline.  Elevated brain natriuretic peptide (BNP) level - No significant complaints of shortness of breath or crackles appreciated on exam. -Difficult evaluation of this isolated metric in a patient that is end-stage renal disease dependent. -Continue to closely follow [place] and adjust as needed with dialysis. -Heart healthy diet recommended.  ESRD on hemodialysis [facility]) - Hemodialysis will be done today as prior nephrology service -Follow recommendations. -Patient normally on Tuesday, Thursday and Saturday schedule.  GERD (gastroesophageal reflux disease) - Continue PPI.  Hypoglycemia -will check cortisol level -Advised not to skip meals -Adjustment of his steroids will also help avoiding future low sugar levels.    Subjective:  Afebrile, no chest pain, no nausea or vomiting.  Good saturation on 2 L nasal cannula supplementation (something that he used chronically at nighttime and on as-needed basis for rest according to patient).  Continues present left knee pain.  Physical Exam: Vitals:   07/26/21 0458 07/26/21 0754 07/26/21 0758 07/26/21 1251  BP: (!) 107/57 (!) 88/64 (!) 89/66 (!) 113/57  Pulse: 78 (!) 48  77  Resp: _0 Temp: 98 F (36.7 C) (!) 97.5 F (36.4 C) 98.2 F (36.8 C) 98.4 F (36.9 C)  TempSrc:  Oral Oral   SpO2: 99% 92%  100%  Weight:  Height:       General exam: Alert, awake, oriented x 3; no chest pain, no nausea, no vomiting.  2 L nasal cannula supplementation in place (patient expressed that he uses chronically at nighttime).  Still complaining of some left knee pain. Respiratory system: Good air movement bilaterally, no using  accessory muscles.  Positive scattered rhonchi.  No frank crackles.  No wheezing.  Normal respiratory rate appreciated. Cardiovascular system:RRR. No rubs or gallops; no JVD. Gastrointestinal system: Abdomen is nondistended, soft and nontender. No organomegaly or masses felt. Normal bowel sounds heard. Central nervous system: Alert and oriented. No focal neurological deficits. Extremities: No cyanosis or clubbing; trace edema appreciated bilaterally.  Mild tenderness to palpation on his left knee reported; no swelling, erythema or warmth sensation appreciated. Skin: No petechiae.  Stage I pressure injury appreciated in his buttocks; no signs of superimposed infection.  Present at time of admission. Psychiatry: Judgement and insight appear normal. Mood & affect appropriate.   Data Reviewed: Seen by physical therapy/Occupational Therapy: Recommending PT/OT for further rehabilitation and conditioning at his returned to skilled nursing facility. CBG's: 64-83 -Cortisol level pending -CBC and renal function pending.  Family Communication: No family at bedside.  Disposition: Status is: Inpatient Remains inpatient appropriate because: Needing hemodialysis and treatment completion/work-up for left knee pain.   Planned Discharge Destination: Skilled nursing facility patient is a long-term resident of nursing home.    Author: Barton Dubois, MD 07/26/2021 6:15 PM  For on call review [email or web address].

## 2021-07-27 DIAGNOSIS — N189 Chronic kidney disease, unspecified: Secondary | ICD-10-CM

## 2021-07-27 DIAGNOSIS — N186 End stage renal disease: Secondary | ICD-10-CM | POA: Diagnosis not present

## 2021-07-27 DIAGNOSIS — R7989 Other specified abnormal findings of blood chemistry: Secondary | ICD-10-CM | POA: Diagnosis not present

## 2021-07-27 DIAGNOSIS — Z862 Personal history of diseases of the blood and blood-forming organs and certain disorders involving the immune mechanism: Secondary | ICD-10-CM

## 2021-07-27 DIAGNOSIS — K219 Gastro-esophageal reflux disease without esophagitis: Secondary | ICD-10-CM | POA: Diagnosis not present

## 2021-07-27 DIAGNOSIS — M25562 Pain in left knee: Secondary | ICD-10-CM | POA: Diagnosis not present

## 2021-07-27 LAB — RENAL FUNCTION PANEL
Albumin: 2.9 g/dL — ABNORMAL LOW (ref 3.5–5.0)
Anion gap: 10 (ref 5–15)
BUN: 27 mg/dL — ABNORMAL HIGH (ref 6–20)
CO2: 29 mmol/L (ref 22–32)
Calcium: 8.5 mg/dL — ABNORMAL LOW (ref 8.9–10.3)
Chloride: 94 mmol/L — ABNORMAL LOW (ref 98–111)
Creatinine, Ser: 4.82 mg/dL — ABNORMAL HIGH (ref 0.61–1.24)
GFR, Estimated: 14 mL/min — ABNORMAL LOW (ref >60)
Glucose, Bld: 111 mg/dL — ABNORMAL HIGH (ref 70–99)
Phosphorus: 4 mg/dL (ref 2.5–4.6)
Potassium: 4.7 mmol/L (ref 3.5–5.1)
Sodium: 133 mmol/L — ABNORMAL LOW (ref 135–145)

## 2021-07-27 LAB — CBC
HCT: 28.7 % — ABNORMAL LOW (ref 39.0–52.0)
Hemoglobin: 8.5 g/dL — ABNORMAL LOW (ref 13.0–17.0)
MCH: 26.6 pg (ref 26.0–34.0)
MCHC: 29.6 g/dL — ABNORMAL LOW (ref 30.0–36.0)
MCV: 89.7 fL (ref 80.0–100.0)
Platelets: 73 10*3/uL — ABNORMAL LOW (ref 150–400)
RBC: 3.2 MIL/uL — ABNORMAL LOW (ref 4.22–5.81)
RDW: 18.6 % — ABNORMAL HIGH (ref 11.5–15.5)
WBC: 5.8 10*3/uL (ref 4.0–10.5)
nRBC: 0 % (ref 0.0–0.2)

## 2021-07-27 LAB — HEPATITIS B SURFACE ANTIGEN: Hepatitis B Surface Ag: NONREACTIVE

## 2021-07-27 LAB — CORTISOL: Cortisol, Plasma: 4.5 ug/dL

## 2021-07-27 LAB — HEPATITIS C ANTIBODY: HCV Ab: NONREACTIVE

## 2021-07-27 LAB — HEPATITIS B CORE ANTIBODY, TOTAL: Hep B Core Total Ab: NONREACTIVE

## 2021-07-27 LAB — HEPATITIS B SURFACE ANTIBODY,QUALITATIVE: Hep B S Ab: REACTIVE — AB

## 2021-07-27 MED ORDER — PREDNISONE 10 MG PO TABS: ORAL_TABLET | ORAL | Status: AC

## 2021-07-27 MED ORDER — OXYCODONE HCL 10 MG PO TABS: 10.0000 mg | ORAL_TABLET | Freq: Four times a day (QID) | ORAL | 0 refills | Status: DC | PRN

## 2021-07-27 MED ORDER — ALBUMIN HUMAN 25 % IV SOLN
25.0000 g | Freq: Once | INTRAVENOUS | Status: AC
  Administered 2021-07-27: 25 g via INTRAVENOUS

## 2021-07-27 MED ORDER — OXYCODONE HCL 10 MG PO TABS
10.0000 mg | ORAL_TABLET | Freq: Four times a day (QID) | ORAL | 0 refills | Status: AC | PRN
Start: 2021-07-27 — End: ?

## 2021-07-27 MED ORDER — LACTULOSE 20 G PO PACK: 20.0000 g | PACK | Freq: Every day | ORAL | Status: AC | PRN

## 2021-07-27 MED ORDER — OXYCODONE HCL 5 MG PO TABS: 5.0000 mg | ORAL_TABLET | Freq: Once | ORAL | Status: DC

## 2021-07-27 NOTE — Procedures (Signed)
   HEMODIALYSIS TREATMENT NOTE:   3.5 hour heparin-free treatment completed using left upper arm AVF (15g/antegrade). Goal met: 2 liters removed with a few goal adjustments for soft blood pressures that did not improve with Albumin 25g x1.  All blood was returned and hemostasis was achieved in 15 minutes.  No changes from pre-HD assessment.  Report called to primary nurse Trilby Drummer.   Rockwell Alexandria, RN

## 2021-07-27 NOTE — Progress Notes (Signed)
Patient complaining of pain 9/10 to left knee. BP 100/59, patient requested Dilaudid IV. MD Zierle-Ghosh made aware. Per MD did not give due to BP 100/59. New orders placed.

## 2021-07-27 NOTE — Progress Notes (Signed)
CSW spoke with Ebony Hail at [facility] who states the patient can return.  Patient will go to room 303B. The number to call for report is [phone].  Madilyn Fireman, MSW, LCSW Transitions of Care  Clinical Social Worker II [phone]

## 2021-07-27 NOTE — Progress Notes (Signed)
Report has been given to [facility] at [facility] of [place].

## 2021-07-27 NOTE — Discharge Summary (Addendum)
Physician Discharge Summary   Patient: [name] MRN: [id number] DOB: 09/29/1970  Admit date:     07/25/2021  Discharge date: 07/27/21  Discharge Physician: Barton Dubois   PCP: [facility]   Recommendations at discharge:  Repeat CBC to follow hemoglobin trend/stability Resumption of outpatient dialysis services as previously scheduled (next treatment 07/30/2021).  Discharge Diagnoses: Principal Problem:   Left knee pain Active Problems:   Hypoglycemia   GERD (gastroesophageal reflux disease)   ESRD on hemodialysis (HCC)   Elevated brain natriuretic peptide (BNP) level   Iron deficiency anemia   Pressure injury of skin   History of anemia due to chronic kidney disease Chronic respiratory failure (patient on 2L [place] supplementation at baseline)  Brief hospital admission course: As per H&P written by Dr. Josephine Cables on 07/25/2021 [name] is a 51 y.o. male with medical history significant of SLE with lupus nephritis and ESRD on HD (TTS), chronic renal transplant rejection (2009), atrial flutter on Eliquis, pulmonary hypertension who presents to the emergency department due to 1 day of sudden onset of left knee pain, pain was in the patella area and medial thigh.  Pain was nonradiating, but was persistent.  He denied any fall, history of gout or pseudogout.  Patient missed his dialysis on Tuesday (2 days ago) due to feeling sick, but he had dialysis done yesterday (7/5).  He went for dialysis this morning, but was unable to dialyze due to the left knee pain, so, EMS was activated and he was taken to the ED for further evaluation and management.  He denies fever, chills, chest pain, shortness of breath, nausea or vomiting.  ED Course:  In the emergency department, BP was soft at 92/63, but other vital signs were within normal range.  Work-up in the ED showed normocytic anemia, normal BMP except for BUN/creatinine 32/5.32, eGFR 12, CBG 62. Left lower extremity venous Doppler  ultrasound showed no evidence of DVT within the left lower extremity Chest x-ray showed stable chest, no acute process Left knee x-ray showed unremarkable left knee IV fentanyl 50 mcg and IV Dilaudid 0.5 mg were given. Nephrologist was consulted and recommended admitting patient for dialysis tomorrow.  Hospitalist was asked to admit patient for further evaluation and management.  Assessment and Plan: * Left knee pain - No effusion or abnormalities appreciated on acute images checked. -Presumably associated with osteoarthritis versus (less likely) associated with lupus. -Steroids has been increased to 20 mg daily (double dose of his home regimen) for anti-inflammatory effect. -Continue as needed analgesics -Continue physical therapy.  Pressure injury of skin -Continue constant repositioning -Preventive measures to be followed.  Iron deficiency anemia -Epogen and IV iron as per nephrology discretion. -No overt bleeding -Hemoglobin stable and close to baseline.  Elevated brain natriuretic peptide (BNP) level -No significant complaints of shortness of breath or crackles appreciated on exam. -BNP elevated; difficult evaluation of this isolated metric in a patient that is end-stage renal disease dependent. -Continue to closely follow volume and adjust as needed with dialysis. -Heart healthy/low sodium diet recommended.  ESRD on hemodialysis [facility]) -Patient normally on Tuesday, Thursday and Saturday schedule.  Patient needs his dialysis on 07/25/2021 prior to admission secondary to excruciating left knee pain. -Hemodialysis done on 07/26/2021 and repeated again on 07/27/2021 to put patient back on his regular schedule. -Next dialysis treatment 07/30/2021 as an outpatient. -Appreciate assistance and recommendation by nephrology service.   GERD (gastroesophageal reflux disease) -Continue PPI.  Hypoglycemia -Advised not to skip meals -Adjustment  of his steroids will also help avoiding future  low sugar levels. -continue to follow CBGs.  Pressure injury -Appreciated on his buttocks -stage I and Present at time of admission -No signs of superimposed infection -Continue preventive measures and constant repositioning.  Chronic resp failure -on 2L [place] supplementation at baseline -acute hypoxic component rule out  -mild SOB sensation in the setting of missing HD prior to admission. -good saturation on chronic supplementation appreciated.  Consultants: Nephrology service Procedures performed: See below for x-ray reports. Disposition: Skilled nursing facility Diet recommendation: Heart healthy diet.  DISCHARGE MEDICATION: Allergies as of 07/27/2021       Reactions   Ciprofloxacin Rash   skin on hands peeled off "in sheets." Other reaction(s): HIVES   Dapsone    Ketorolac Tromethamine    Other reaction(s): Serum creatinine raised   Methadone    Other reaction(s): IRREGULAR HEART RATE   Nsaids    Other reaction(s): Serum creatinine raised   Oxycodone    Other reaction(s): Drowsy   Other Rash   Sulfa Antibiotics Rash   Sulfamethoxazole Rash        Medication List     STOP taking these medications    cefdinir 300 MG capsule Commonly known as: OMNICEF       TAKE these medications    apixaban 5 MG Tabs tablet Commonly known as: ELIQUIS Take 5 mg by mouth 2 (two) times daily.   calcitRIOL 0.25 MCG capsule Commonly known as: ROCALTROL Take 0.25 mcg by mouth 3 (three) times a week. Tues, thurs, sat   cinacalcet 30 MG tablet Commonly known as: SENSIPAR Take 30 mg by mouth 3 (three) times a week. Tues, thur, sat   ferrous sulfate 324 (65 Fe) MG Tbec Take 1 tablet (325 mg total) by mouth 2 (two) times daily.   gabapentin 100 MG capsule Commonly known as: NEURONTIN Take 2 capsules by mouth 3 (three) times a week.   lactulose 20 g packet Commonly known as: CEPHULAC Take 1 packet (20 g total) by mouth daily as needed (moderate to severe  constipation). What changed: reasons to take this   loperamide 2 MG capsule Commonly known as: IMODIUM Take 1 capsule (2 mg total) by mouth every 6 (six) hours as needed for diarrhea or loose stools. For 3 to 5 days, then stop   melatonin 3 MG Tabs tablet Take 3 mg by mouth at bedtime as needed.   midodrine 10 MG tablet Commonly known as: PROAMATINE Take 10 mg by mouth 3 (three) times daily.   omeprazole 20 MG capsule Commonly known as: PRILOSEC Take 20 mg by mouth daily.   Oxycodone HCl 10 MG Tabs Take 1 tablet (10 mg total) by mouth every 6 (six) hours as needed (severe pain). What changed: reasons to take this   predniSONE 10 MG tablet Commonly known as: DELTASONE Take 20 mg by mouth daily x7 days then 50 mg daily x5 days and then resume daily 10 mg prednisone dose. What changed:  how much to take how to take this when to take this additional instructions   senna 8.6 MG tablet Commonly known as: SENOKOT Take 2 tablets by mouth every 12 (twelve) hours as needed for constipation.   sildenafil 50 MG tablet Commonly known as: VIAGRA Take 50 mg by mouth every 8 (eight) hours.   simethicone 80 MG chewable tablet Commonly known as: MYLICON Chew 80 mg by mouth every 6 (six) hours as needed for flatulence.   tamsulosin 0.4 MG Caps  capsule Commonly known as: FLOMAX Once per day on Sun Mon Wed Fri (i.e. nonhemodialysis days). What changed:  how much to take how to take this when to take this               Discharge Care Instructions  (From admission, onward)           Start     Ordered   07/27/21 0000  Discharge wound care:       Comments: Constant repositioning and preventive measures recommended.   07/27/21 1352            Contact information for follow-up providers     [facility]. Schedule an appointment as soon as possible for a visit in 2 week(s).   Contact information: [place] [address] [phone]               Contact information for after-discharge care     [facility] .   Service: Skilled Nursing Contact information: [address] [place] [phone]                    Discharge Exam: Danley Danker Weights   07/25/21 1335 07/27/21 0945  Weight: 84.3 kg 91.1 kg   General exam: Alert, awake, oriented x 3; no chest pain, no nausea, no vomiting.  2 L nasal cannula supplementation in place (patient expressed that he uses chronically at nighttime).  Still complaining of some left knee pain. Respiratory system: Good air movement bilaterally, no using accessory muscles.  Positive scattered rhonchi.  No frank crackles.  No wheezing.  Normal respiratory rate appreciated. Cardiovascular system:RRR. No rubs or gallops; no JVD. Gastrointestinal system: Abdomen is nondistended, soft and nontender. No organomegaly or masses felt. Normal bowel sounds heard. Central nervous system: Alert and oriented. No focal neurological deficits. Extremities: No cyanosis or clubbing; trace edema appreciated bilaterally.  Mild tenderness to palpation on his left knee reported; no swelling, erythema or warmth sensation appreciated. Skin: No petechiae.  Stage I pressure injury appreciated in his buttocks; no signs of superimposed infection.  Present at time of admission. Psychiatry: Judgement and insight appear normal. Mood & affect appropriate.   Condition at discharge: Stable and improved.  The results of significant diagnostics from this hospitalization (including imaging, microbiology, ancillary and laboratory) are listed below for reference.   Imaging Studies: US Venous Img Lower Unilateral Left  Result Date: 07/25/2021 CLINICAL DATA:  Left leg pain and swelling EXAM: LEFT LOWER EXTREMITY VENOUS DOPPLER ULTRASOUND TECHNIQUE: Gray-scale sonography with compression, as well as color and duplex ultrasound, were performed  to evaluate the deep venous system(s) from the level of the common femoral vein through the popliteal and proximal calf veins. COMPARISON:  None Available. FINDINGS: VENOUS Normal compressibility of the common femoral, superficial femoral, and popliteal veins, as well as the visualized calf veins. Visualized portions of profunda femoral vein and great saphenous vein unremarkable. No filling defects to suggest DVT on grayscale or color Doppler imaging. Doppler waveforms show normal direction of venous flow, normal respiratory plasticity and response to augmentation. Limited views of the contralateral common femoral vein are unremarkable. OTHER None. Limitations: none IMPRESSION: 1. No evidence of deep venous thrombosis within the left lower extremity. Electronically Signed   By: Randa Ngo M.D.   On: 07/25/2021 15:57   DG Chest Portable 1 View  Result Date: 07/25/2021 CLINICAL DATA:  Weakness, hypotension EXAM: PORTABLE CHEST 1  VIEW COMPARISON:  11/29/2019 FINDINGS: Single frontal view of the chest demonstrates stable enlargement of the cardiac silhouette. No acute airspace disease, effusion, or pneumothorax. No acute bony abnormalities. Diffuse atherosclerosis. IMPRESSION: 1. Stable chest, no acute process. Electronically Signed   By: Randa Ngo M.D.   On: 07/25/2021 15:19   DG Knee Complete 4 Views Left  Result Date: 07/25/2021 CLINICAL DATA:  Weakness, left knee pain EXAM: LEFT KNEE - COMPLETE 4+ VIEW COMPARISON:  None Available. FINDINGS: Frontal, bilateral oblique, and lateral views of the left knee are obtained. No acute fracture, subluxation, or dislocation. Joint spaces are relatively well preserved. No joint effusion. Soft tissues are unremarkable. Diffuse atherosclerosis. IMPRESSION: 1. Unremarkable left knee. Electronically Signed   By: Randa Ngo M.D.   On: 07/25/2021 15:18    Microbiology: Results for orders placed or performed during the hospital encounter of 12/05/19  Respiratory  Panel by RT PCR (Flu A&B, Covid) - Nasopharyngeal Swab     Status: None   Collection Time: 12/05/19 10:04 PM   Specimen: Nasopharyngeal Swab  Result Value Ref Range Status   SARS Coronavirus 2 by RT PCR NEGATIVE NEGATIVE Final    Comment: (NOTE) SARS-CoV-2 target nucleic acids are NOT DETECTED.  The SARS-CoV-2 RNA is generally detectable in upper respiratoy specimens during the acute phase of infection. The lowest concentration of SARS-CoV-2 viral copies this assay can detect is 131 copies/mL. A negative result does not preclude SARS-Cov-2 infection and should not be used as the sole basis for treatment or other patient management decisions. A negative result may occur with  improper specimen collection/handling, submission of specimen other than nasopharyngeal swab, presence of viral mutation(s) within the areas targeted by this assay, and inadequate number of viral copies (<131 copies/mL). A negative result must be combined with clinical observations, patient history, and epidemiological information. The expected result is Negative.  Fact Sheet for Patients:  PinkCheek.be  Fact Sheet for Healthcare Providers:  GravelBags.it  This test is no t yet approved or cleared by the [place] FDA and  has been authorized for detection and/or diagnosis of SARS-CoV-2 by FDA under an Emergency Use Authorization (EUA). This EUA will remain  in effect (meaning this test can be used) for the duration of the COVID-19 declaration under Section 564(b)(1) of the Act, 21 U.S.C. section 360bbb-3(b)(1), unless the authorization is terminated or revoked sooner.     Influenza A by PCR NEGATIVE NEGATIVE Final   Influenza B by PCR NEGATIVE NEGATIVE Final    Comment: (NOTE) The Xpert Xpress SARS-CoV-2/FLU/RSV assay is intended as an aid in  the diagnosis of influenza from Nasopharyngeal swab specimens and  should not be used as a sole basis  for treatment. Nasal washings and  aspirates are unacceptable for Xpert Xpress SARS-CoV-2/FLU/RSV  testing.  Fact Sheet for Patients: PinkCheek.be  Fact Sheet for Healthcare Providers: GravelBags.it  This test is not yet approved or cleared by the [place] FDA and  has been authorized for detection and/or diagnosis of SARS-CoV-2 by  FDA under an Emergency Use Authorization (EUA). This EUA will remain  in effect (meaning this test can be used) for the duration of the  Covid-19 declaration under Section 564(b)(1) of the Act, 21  U.S.C. section 360bbb-3(b)(1), unless the authorization is  terminated or revoked. Performed at [facility], [address] [place], [place] [address]   Resp Panel by RT-PCR (Flu A&B, Covid) Nasopharyngeal Swab     Status: None   Collection Time: 12/16/19  12:03 PM   Specimen: Nasopharyngeal Swab; Nasopharyngeal(NP) swabs in vial transport medium  Result Value Ref Range Status   SARS Coronavirus 2 by RT PCR NEGATIVE NEGATIVE Final    Comment: (NOTE) SARS-CoV-2 target nucleic acids are NOT DETECTED.  The SARS-CoV-2 RNA is generally detectable in upper respiratory specimens during the acute phase of infection. The lowest concentration of SARS-CoV-2 viral copies this assay can detect is 138 copies/mL. A negative result does not preclude SARS-Cov-2 infection and should not be used as the sole basis for treatment or other patient management decisions. A negative result may occur with  improper specimen collection/handling, submission of specimen other than nasopharyngeal swab, presence of viral mutation(s) within the areas targeted by this assay, and inadequate number of viral copies(<138 copies/mL). A negative result must be combined with clinical observations, patient history, and epidemiological information. The expected result is Negative.  Fact Sheet for Patients:   EntrepreneurPulse.com.au  Fact Sheet for Healthcare Providers:  IncredibleEmployment.be  This test is no t yet approved or cleared by the [place] FDA and  has been authorized for detection and/or diagnosis of SARS-CoV-2 by FDA under an Emergency Use Authorization (EUA). This EUA will remain  in effect (meaning this test can be used) for the duration of the COVID-19 declaration under Section 564(b)(1) of the Act, 21 U.S.C.section 360bbb-3(b)(1), unless the authorization is terminated  or revoked sooner.       Influenza A by PCR NEGATIVE NEGATIVE Final   Influenza B by PCR NEGATIVE NEGATIVE Final    Comment: (NOTE) The Xpert Xpress SARS-CoV-2/FLU/RSV plus assay is intended as an aid in the diagnosis of influenza from Nasopharyngeal swab specimens and should not be used as a sole basis for treatment. Nasal washings and aspirates are unacceptable for Xpert Xpress SARS-CoV-2/FLU/RSV testing.  Fact Sheet for Patients: EntrepreneurPulse.com.au  Fact Sheet for Healthcare Providers: IncredibleEmployment.be  This test is not yet approved or cleared by the [place] FDA and has been authorized for detection and/or diagnosis of SARS-CoV-2 by FDA under an Emergency Use Authorization (EUA). This EUA will remain in effect (meaning this test can be used) for the duration of the COVID-19 declaration under Section 564(b)(1) of the Act, 21 U.S.C. section 360bbb-3(b)(1), unless the authorization is terminated or revoked.  Performed at [facility], [address] [place], [place] [address]     Labs: CBC: Recent Labs  Lab 07/25/21 1449 07/25/21 2152 07/27/21 1030  WBC 6.4 5.9 5.8  NEUTROABS 4.7  --   --   HGB 9.1* 9.1* 8.5*  HCT 32.3* 31.4* 28.7*  MCV 92.8 91.3 89.7  PLT 85* 84* 73*   Basic Metabolic Panel: Recent Labs  Lab 07/25/21 1449 07/25/21 2152 07/27/21 1030  NA 138  --  133*   K 4.1  --  4.7  CL 98  --  94*  CO2 29  --  29  GLUCOSE 62*  --  111*  BUN 32*  --  27*  CREATININE 5.32* 5.59* 4.82*  CALCIUM 9.3  --  8.5*  MG 2.2  --   --   PHOS  --   --  4.0   Liver Function Tests: Recent Labs  Lab 07/25/21 1449 07/27/21 1030  AST 15  --   ALT 9  --   ALKPHOS 147*  --   BILITOT 1.2  --   PROT 7.7  --   ALBUMIN 3.1* 2.9*   CBG: Recent Labs  Lab 07/25/21 2002 07/26/21 0140 07/26/21 0500  07/26/21 0706 07/26/21 1108  GLUCAP 83 64* 84 56* 84    Discharge time spent: greater than 30 minutes.  Signed: Barton Dubois, MD [facility] 07/27/2021

## 2021-07-28 LAB — HEPATITIS B SURFACE ANTIBODY, QUANTITATIVE: Hep B S AB Quant (Post): >1000 m[IU]/mL (ref >9.9)

## 2021-07-28 NOTE — Progress Notes (Signed)
   07/28/21 0244  Vitals  Temp 98 F (36.7 C)  Temp Source Oral  BP 105/67  MAP (mmHg) 78  BP Location Right Arm  BP Method Automatic  Patient Position (if appropriate) Lying  Pulse Rate 81  Pulse Rate Source Dinamap  Resp 17  Level of Consciousness  Level of Consciousness Alert  Oxygen Therapy  SpO2 100 %  O2 Device Nasal Cannula  O2 Flow Rate (L/min) 2 L/min  Pain Assessment  Pain Scale 0-10  Pain Score 0   Patient is Discharged to [place], picked up by EMS, IV removed. Vitals stable.

## 2021-08-23 ENCOUNTER — Emergency Department: Payer: No Typology Code available for payment source

## 2021-08-23 ENCOUNTER — Other Ambulatory Visit: Payer: Self-pay

## 2021-08-23 ENCOUNTER — Encounter: Payer: Self-pay | Admitting: Emergency Medicine

## 2021-08-23 ENCOUNTER — Emergency Department
Admission: EM | Admit: 2021-08-23 | Discharge: 2021-08-24 | Disposition: A | Discharge status: Other Acute Inpatient Hospital | Payer: No Typology Code available for payment source | Attending: Emergency Medicine | Admitting: Emergency Medicine

## 2021-08-23 DIAGNOSIS — R109 Unspecified abdominal pain: Secondary | ICD-10-CM | POA: Diagnosis present

## 2021-08-23 DIAGNOSIS — I959 Hypotension, unspecified: Secondary | ICD-10-CM | POA: Insufficient documentation

## 2021-08-23 DIAGNOSIS — R112 Nausea with vomiting, unspecified: Secondary | ICD-10-CM | POA: Insufficient documentation

## 2021-08-23 LAB — COMPREHENSIVE METABOLIC PANEL
ALT: 8 U/L (ref 0–44)
AST: 19 U/L (ref 15–41)
Albumin: 3.2 g/dL — ABNORMAL LOW (ref 3.5–5.0)
Alkaline Phosphatase: 158 U/L — ABNORMAL HIGH (ref 38–126)
Anion gap: 13 (ref 5–15)
BUN: 18 mg/dL (ref 6–20)
CO2: 29 mmol/L (ref 22–32)
Calcium: 7.6 mg/dL — ABNORMAL LOW (ref 8.9–10.3)
Chloride: 94 mmol/L — ABNORMAL LOW (ref 98–111)
Creatinine, Ser: 4.22 mg/dL — ABNORMAL HIGH (ref 0.61–1.24)
GFR, Estimated: 16 mL/min — ABNORMAL LOW (ref >60)
Glucose, Bld: 53 mg/dL — ABNORMAL LOW (ref 70–99)
Potassium: 4 mmol/L (ref 3.5–5.1)
Sodium: 136 mmol/L (ref 135–145)
Total Bilirubin: 2.6 mg/dL — ABNORMAL HIGH (ref 0.3–1.2)
Total Protein: 7.1 g/dL (ref 6.5–8.1)

## 2021-08-23 LAB — CBC
HCT: 35.1 % — ABNORMAL LOW (ref 39.0–52.0)
Hemoglobin: 10.5 g/dL — ABNORMAL LOW (ref 13.0–17.0)
MCH: 26.6 pg (ref 26.0–34.0)
MCHC: 29.9 g/dL — ABNORMAL LOW (ref 30.0–36.0)
MCV: 88.9 fL (ref 80.0–100.0)
Platelets: 73 10*3/uL — ABNORMAL LOW (ref 150–400)
RBC: 3.95 MIL/uL — ABNORMAL LOW (ref 4.22–5.81)
RDW: 17.9 % — ABNORMAL HIGH (ref 11.5–15.5)
WBC: 7.3 10*3/uL (ref 4.0–10.5)
nRBC: 0 % (ref 0.0–0.2)

## 2021-08-23 LAB — LACTIC ACID, PLASMA: Lactic Acid, Venous: 1.1 mmol/L (ref 0.5–1.9)

## 2021-08-23 LAB — LIPASE, BLOOD: Lipase: 22 U/L (ref 11–51)

## 2021-08-23 MED ORDER — IOHEXOL 300 MG/ML  SOLN: 100.0000 mL | Freq: Once | INTRAMUSCULAR | Status: DC | PRN

## 2021-08-23 MED ORDER — MIDODRINE HCL 5 MG PO TABS
10.0000 mg | ORAL_TABLET | Freq: Once | ORAL | Status: AC
Start: 2021-08-24 — End: 2021-08-24
  Administered 2021-08-24: 10 mg via ORAL
  Filled 2021-08-23: qty 2

## 2021-08-23 MED ORDER — SODIUM CHLORIDE 0.9 % IV SOLN
1.0000 g | Freq: Once | INTRAVENOUS | Status: AC
  Administered 2021-08-23: 1 g via INTRAVENOUS
  Filled 2021-08-23: qty 10

## 2021-08-23 MED ORDER — IOHEXOL 350 MG/ML SOLN
75.0000 mL | Freq: Once | INTRAVENOUS | Status: AC | PRN
  Administered 2021-08-23: 75 mL via INTRAVENOUS

## 2021-08-23 MED ORDER — HALOPERIDOL LACTATE 5 MG/ML IJ SOLN
5.0000 mg | Freq: Once | INTRAMUSCULAR | Status: AC
Start: 2021-08-23 — End: 2021-08-23
  Administered 2021-08-23: 5 mg via INTRAVENOUS
  Filled 2021-08-23: qty 1

## 2021-08-23 MED ORDER — SODIUM CHLORIDE 0.9 % IV SOLN
500.0000 mg | Freq: Once | INTRAVENOUS | Status: AC
  Administered 2021-08-23: 500 mg via INTRAVENOUS
  Filled 2021-08-23: qty 5

## 2021-08-23 MED ORDER — METHYLPREDNISOLONE SODIUM SUCC 125 MG IJ SOLR
125.0000 mg | Freq: Once | INTRAMUSCULAR | Status: AC
  Administered 2021-08-23: 125 mg via INTRAVENOUS
  Filled 2021-08-23: qty 2

## 2021-08-23 MED ORDER — FENTANYL CITRATE PF 50 MCG/ML IJ SOSY
50.0000 ug | PREFILLED_SYRINGE | Freq: Once | INTRAMUSCULAR | Status: AC
  Administered 2021-08-23: 50 ug via INTRAVENOUS
  Filled 2021-08-23: qty 1

## 2021-08-23 NOTE — ED Notes (Signed)
Patient on phone with family member speaking about getting [facility]. Family member states patient will not be returning home until home health is established.

## 2021-08-23 NOTE — ED Provider Notes (Signed)
[facility] Provider Note    Event Date/Time   First MD Initiated Contact with Patient 08/23/21 1701     (approximate)   History   Abdominal Pain   HPI  [name] is a 51 y.o. male who presents to the emergency department today because of concerns for abdominal pain, nausea and vomiting.  Patient states he has a history of lupus and is concerned that he is having a lupus flare.  Symptoms started this morning.  Patient says that typically when he has his lupus flares he is given steroids.  Additionally the patient has felt increased weakness.  Says it is hard for him to ambulate.  Patient is on dialysis and did receive his course of dialysis yesterday.     Physical Exam   Triage Vital Signs: ED Triage Vitals  Enc Vitals Group     BP 08/23/21 1558 (!) 91/52     Pulse Rate 08/23/21 1558 79     Resp 08/23/21 1558 18     Temp 08/23/21 1558 98 F (36.7 C)     Temp Source 08/23/21 1558 Oral     SpO2 08/23/21 1558 92 %     Weight 08/23/21 1603 195 lb (88.5 kg)     Height 08/23/21 1603 6\' 2"  (1.88 m)     Head Circumference --      Peak Flow --      Pain Score 08/23/21 1603 9     Pain Loc --      Pain Edu? --      Excl. in [place]? --     Most recent vital signs: Vitals:   08/23/21 1558  BP: (!) 91/52  Pulse: 79  Resp: 18  Temp: 98 F (36.7 C)  SpO2: 92%    General: Awake, alert, oriented. CV:  Good peripheral perfusion. Regular rate and rhythm. Resp:  Normal effort. Lungs clear. Abd:  No distention. Some mid abdominal tenderness.   ED Results / Procedures / Treatments   Labs (all labs ordered are listed, but only abnormal results are displayed) Labs Reviewed  COMPREHENSIVE METABOLIC PANEL - Abnormal; Notable for the following components:      Result Value   Chloride 94 (*)    Glucose, Bld 53 (*)    Creatinine, Ser 4.22 (*)    Calcium 7.6 (*)    Albumin 3.2 (*)    Alkaline Phosphatase 158 (*)    Total Bilirubin 2.6 (*)    GFR,  Estimated 16 (*)    All other components within normal limits  CBC - Abnormal; Notable for the following components:   RBC 3.95 (*)    Hemoglobin 10.5 (*)    HCT 35.1 (*)    MCHC 29.9 (*)    RDW 17.9 (*)    Platelets 73 (*)    All other components within normal limits  CULTURE, BLOOD (ROUTINE X 2)  CULTURE, BLOOD (ROUTINE X 2)  LIPASE, BLOOD  URINALYSIS, ROUTINE W REFLEX MICROSCOPIC  LACTIC ACID, PLASMA  LACTIC ACID, PLASMA     EKG  None   RADIOLOGY I independently interpreted and visualized the CXR. My interpretation: cardiomegaly, right lower lobe pneumonia Radiology interpretation:  IMPRESSION:  Cardiomegaly, vascular congestion.    Right base atelectasis or infiltrate with small right effusion.    I independently interpreted and visualized the ct abd/pel. My interpretation: No free air Radiology interpretation:  IMPRESSION:  Changes consistent with end-stage renal disease. Progressive  calcification in a previous  transplant kidney is noted. Changes of  anasarca and mild free fluid in the abdomen are seen.    Right lower lobe infiltrate with associated small effusion.    Diverticulosis without diverticulitis. Mild changes of constipation  are seen.     PROCEDURES:  Critical Care performed: No  Procedures   MEDICATIONS ORDERED IN ED: Medications - No data to display   IMPRESSION / MDM / [place] / ED COURSE  I reviewed the triage vital signs and the nursing notes.                              Differential diagnosis includes, but is not limited to, intraabdominal perforation, pancreatitis, hepatitis, lupus.  Patient's presentation is most consistent with acute presentation with potential threat to life or bodily function.  Patient presented to the emergency department today because of concerns for abdominal pain, nausea and vomiting and concern for possible lupus flare.  Patient was tender in the mid abdominal area.  CT scan was obtained  which did not show any acute intra-abdominal abnormalities however was concerning for possible pneumonia.  Chest x-ray again raise possibility of pneumonia in the right lower lobe.  While here in the emergency department patient did become hypotensive.  Given history of dialysis he was given small fluid bolus which did help with his blood pressure.  However given episode of hypotension, weakness do think patient should receive treatment for pneumonia.  I discussed this with the patient.  We will send off information to [place] for possible transfer.  FINAL CLINICAL IMPRESSION(S) / ED DIAGNOSES   Final diagnoses:  Abdominal pain, unspecified abdominal location  Hypotension, unspecified hypotension type      Note:  This document was prepared using Dragon voice recognition software and may include unintentional dictation errors.    Nance Pear, MD 08/23/21 2329

## 2021-08-23 NOTE — ED Provider Triage Note (Signed)
Emergency Medicine Provider Triage Evaluation Note  [name] , a 51 y.o. male  was evaluated in triage.  Pt complains of lupus flare.  Dialysis patient.  Had dialysis yesterday..  Review of Systems  Positive: Lupus flare Negative: Fever  Physical Exam  BP (!) 91/52 (BP Location: Right Arm)   Pulse 79   Temp 98 F (36.7 C) (Oral)   Resp 18   Ht 6\' 2"  (1.88 m)   Wt 88.5 kg   SpO2 92%   BMI 25.04 kg/m  Gen:   Awake, no distress   Resp:  Normal effort  MSK:   Moves extremities without difficulty  Other:    Medical Decision Making  Medically screening exam initiated at 4:05 PM.  Appropriate orders placed.  [name] was informed that the remainder of the evaluation will be completed by another provider, this initial triage assessment does not replace that evaluation, and the importance of remaining in the ED until their evaluation is complete.     Versie Starks, PA-C 08/23/21 1605

## 2021-08-23 NOTE — ED Triage Notes (Signed)
Patient c/o lupus flare up, abdominal pain, nausea and diarrhea onset of this morning. Patient is dialysis pt on T/TH/Sat and had treatment yesterday. Patient also supposed to be on 3L [place] however did not come with it on. Placed on 3L in triage.

## 2021-08-24 DIAGNOSIS — R109 Unspecified abdominal pain: Secondary | ICD-10-CM | POA: Diagnosis present

## 2021-08-24 DIAGNOSIS — R112 Nausea with vomiting, unspecified: Secondary | ICD-10-CM | POA: Diagnosis not present

## 2021-08-24 DIAGNOSIS — I959 Hypotension, unspecified: Secondary | ICD-10-CM | POA: Diagnosis not present

## 2021-08-24 MED ORDER — HALOPERIDOL LACTATE 5 MG/ML IJ SOLN
5.0000 mg | Freq: Once | INTRAMUSCULAR | Status: AC
  Administered 2021-08-24: 5 mg via INTRAVENOUS
  Filled 2021-08-24: qty 1

## 2021-08-24 NOTE — ED Provider Notes (Signed)
I consult with Dr. Kimber Relic, accepting hospitalist at the [facility]. he agrees to accept the patient in transfer.  We will arrange transport.   Vladimir Crofts, MD 08/24/21 [phone]

## 2021-08-24 NOTE — ED Notes (Signed)
[place]. ([name]). Hospitalist still has not reviewed chart. Only 1 hospitalist on for [place]. [name] states that it may be the morning of 8-5 before chart is reviewed

## 2021-08-24 NOTE — ED Notes (Signed)
RECEIVED CALL FROM [facility] ([facility]). PT ACCEPTED TO [facility] BY Kimber Relic, MD .

## 2021-08-24 NOTE — ED Notes (Signed)
[place] . Chart being reviewed by hospitalist. Will return call.

## 2021-08-24 NOTE — ED Notes (Signed)
CALLED [facility] ([facility]) FOR TRANSPORT

## 2021-08-28 LAB — CULTURE, BLOOD (ROUTINE X 2)
Culture: NO GROWTH
Culture: NO GROWTH

## 2021-09-11 ENCOUNTER — Telehealth: Payer: Self-pay | Admitting: *Deleted

## 2021-09-11 NOTE — Telephone Encounter (Signed)
[name] called from the [place] in [place] where he is in the ICU. He states that the [place] does not have any record that he was in pain management with our clinic. We have not seen him since 10/2020 but he was seeing [facility] [place] monthly. It looks like he has been in and out of the hospital since that time. He is asking for his records be sent to the [place]. I have given him the number for [facility] to make the request. [phone]

## 2021-10-16 ENCOUNTER — Ambulatory Visit: Payer: Self-pay | Attending: Internal Medicine | Admitting: Internal Medicine

## 2021-10-16 ENCOUNTER — Encounter: Payer: Self-pay | Admitting: Internal Medicine

## 2021-10-16 NOTE — Progress Notes (Deleted)
New Outpatient Visit Date: 10/16/2021  Referring Provider: [facility], [address] [place],  [place] [address]  Chief Complaint: ***  HPI:  [name] is a 51 y.o. male who is being seen today for the evaluation of atrial fibrillation at the request of [facility]. 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 ***  --------------------------------------------------------------------------------------------------  Cardiovascular History & Procedures: Cardiovascular Problems: ***  Risk Factors: ***  Cath/PCI: ***  CV Surgery: ***  EP Procedures and Devices: ***  Non-Invasive Evaluation(s): Limited TTE (03/27/2021, [facility]): Normal LV size and wall thickness.  LVEF greater than 70% with grade 2 diastolic dysfunction.  Moderately to severely dilated RV with moderately reduced function.  Abnormal septal motion consistent with RV pressure and volume overload.  Moderately dilated right atrium.  Normal-appearing aortic valve.  Moderate mitral annular calcification noted.  Severe tricuspid regurgitation.  Severe pulmonary hypertension (RVSP 78 mmHg). TTE (11/19/2019): Normal LV size and wall thickness.  LVEF 65-70%.  Normal RV size and function.  Mild aortic stenosis and regurgitation.  Recent CV Pertinent Labs: Lab Results  Component Value Date   INR 1.8 (H) 11/18/2019   BNP 424.0 (H) 07/25/2021   K 4.0 08/23/2021   MG 2.2 07/25/2021   BUN 18 08/23/2021   CREATININE 4.22 (H) 08/23/2021    --------------------------------------------------------------------------------------------------  Past Medical History:  Diagnosis Date   Anemia    Collagen vascular disease ([place])    Lupus ([place])    Renal disorder    Renal insufficiency    Renal transplant recipient     Past Surgical History:  Procedure  Laterality Date   NEPHRECTOMY TRANSPLANTED ORGAN      No outpatient medications have been marked as taking for the 10/16/21 encounter (Appointment) with Allee Busk, Harrell Gave, MD.    Allergies: Ciprofloxacin, Dapsone, Ketorolac tromethamine, Methadone, Nsaids, Oxycodone, Other, Sulfa antibiotics, and Sulfamethoxazole  Social History   Tobacco Use   Smoking status: Former    Packs/day: 0.50    Years: 13.00    Total pack years: 6.50    Types: Cigarettes   Smokeless tobacco: Never  Vaping Use   Vaping Use: Never used  Substance Use Topics   Alcohol use: Yes    Comment: Occasional   Drug use: No    Family History  Problem Relation Age of Onset   Hypertension Other    Brain cancer Mother        Died at age 41   Aneurysm Mother    CAD Father    Heart attack Father 75    Review of Systems: A 12-system review of systems was performed and was negative except as noted in the HPI.  --------------------------------------------------------------------------------------------------  Physical Exam: There were no vitals taken for this visit.  General:  *** HEENT: No conjunctival pallor or scleral icterus. Facemask in place. Neck: Supple without lymphadenopathy, thyromegaly, JVD, or HJR. No carotid bruit. Lungs: Normal work of breathing. Clear to auscultation bilaterally without wheezes or crackles. Heart: Regular rate and rhythm without murmurs, rubs, or gallops. Non-displaced PMI. Abd: Bowel sounds present. Soft, NT/ND without hepatosplenomegaly Ext: No lower extremity edema. Radial, PT, and DP pulses are 2+ bilaterally Skin: Warm and dry without rash. Neuro: CNIII-XII intact. Strength and fine-touch sensation intact in upper and lower extremities bilaterally. Psych: Normal mood and affect.  EKG:  ***  Lab Results  Component Value Date   WBC 7.3 08/23/2021  HGB 10.5 (L) 08/23/2021   HCT 35.1 (L) 08/23/2021   MCV 88.9 08/23/2021   PLT 73 (L) 08/23/2021    Lab Results   Component Value Date   NA 136 08/23/2021   K 4.0 08/23/2021   CL 94 (L) 08/23/2021   CO2 29 08/23/2021   BUN 18 08/23/2021   CREATININE 4.22 (H) 08/23/2021   GLUCOSE 53 (L) 08/23/2021   ALT 8 08/23/2021    No results found for: "CHOL", "HDL", "LDLCALC", "LDLDIRECT", "TRIG", "CHOLHDL"   --------------------------------------------------------------------------------------------------  ASSESSMENT AND PLAN: ***  Nelva Bush, MD 10/16/2021 7:29 AM

## 2022-01-20 DEATH — deceased

## 2022-06-16 IMAGING — CT CT TIBIA FIBULA *R* W/O CM
1 series · 12 of 14 positions shown, 15 images · non-contrast
Comparison: None.

CLINICAL DATA: Right lower extremity pain and swelling. History of
laceration.

EXAM:
CT OF THE LOWER RIGHT EXTREMITY WITHOUT CONTRAST
TECHNIQUE: Multidetector CT imaging of the right lower extremity was performed
according to the standard protocol.

[Series 5: axial st · axial · 0.42mm/px · z∈[-593,-164]mm · 12 of 338 slices shown, 15 images]
[im 26/338  soft-tissue]
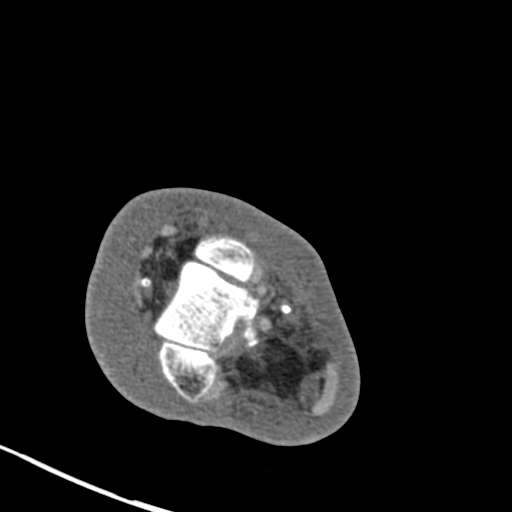
[im 26/338  bone]
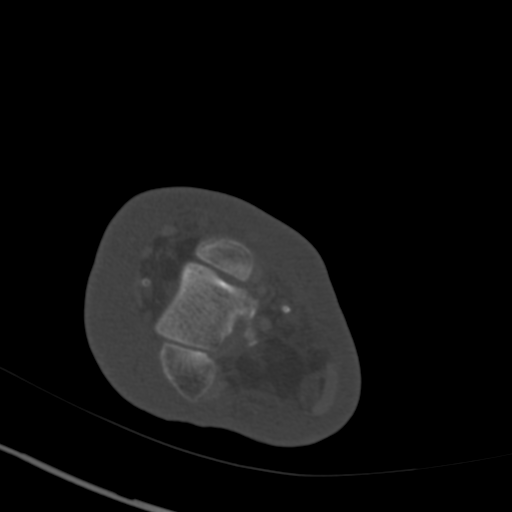
[im 52/338  bone]
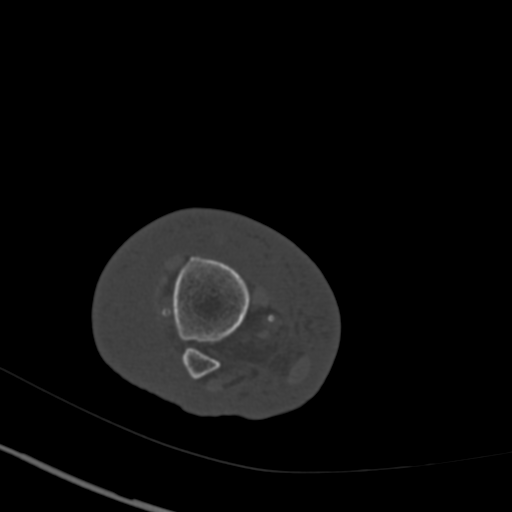
[im 78/338  bone]
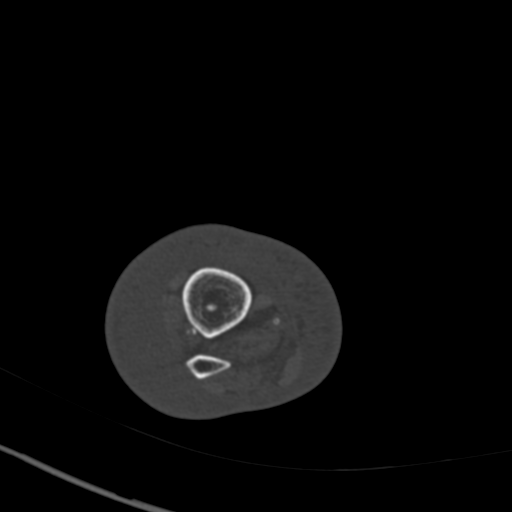
[im 104/338  bone]
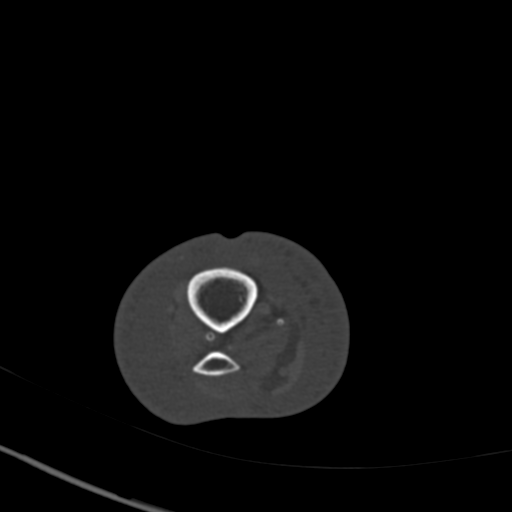
[im 130/338  soft-tissue]
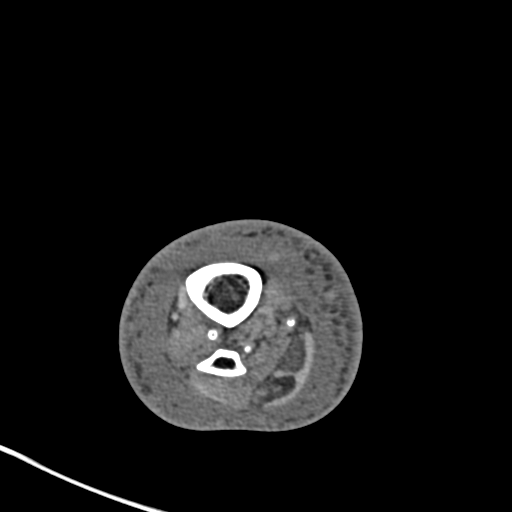
[im 130/338  bone]
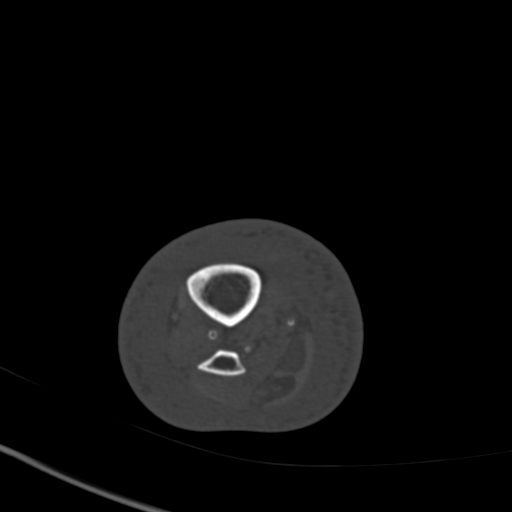
[im 156/338  bone]
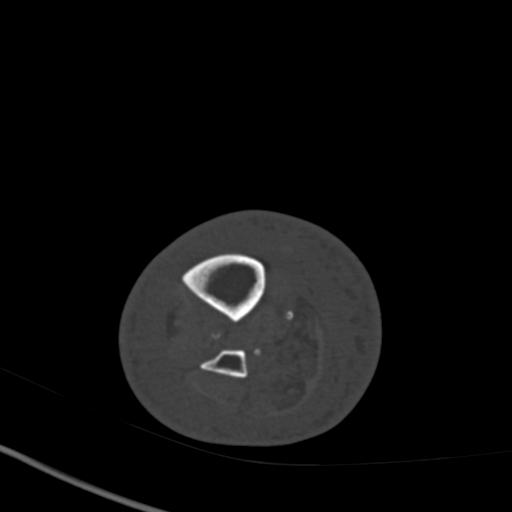
[im 182/338  bone]
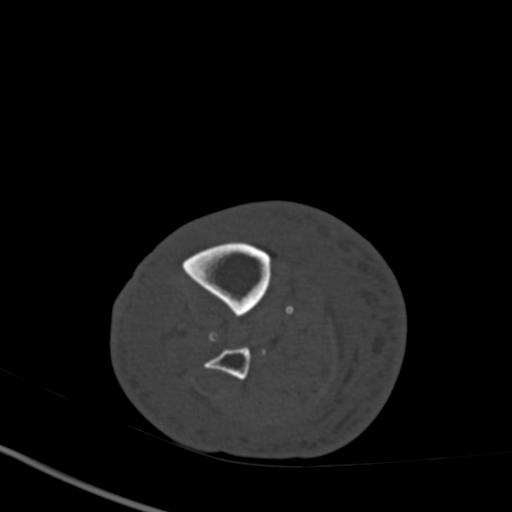
[im 208/338  bone]
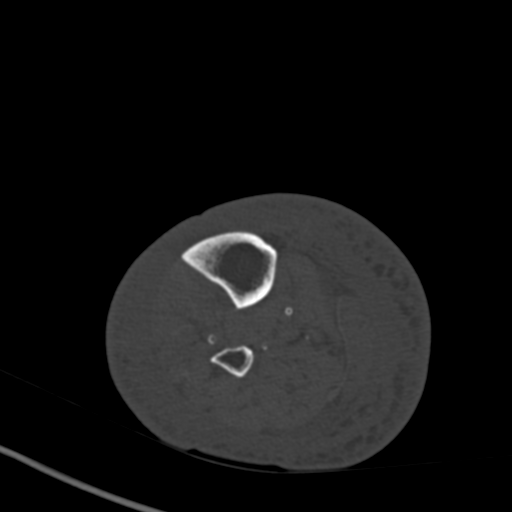
[im 234/338  soft-tissue]
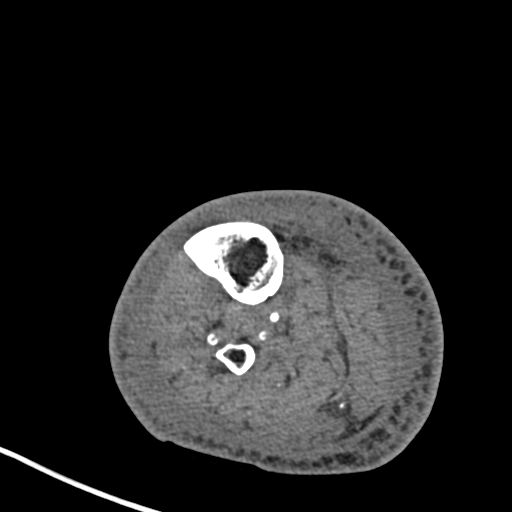
[im 234/338  bone]
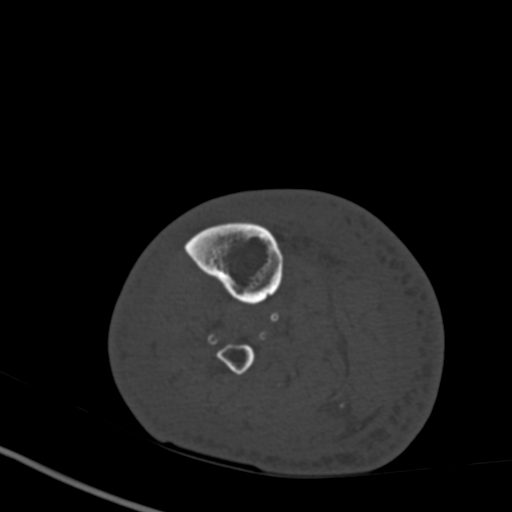
[im 260/338  bone]
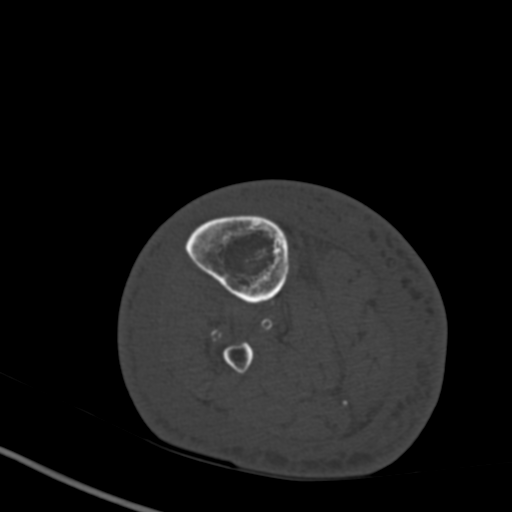
[im 286/338  bone]
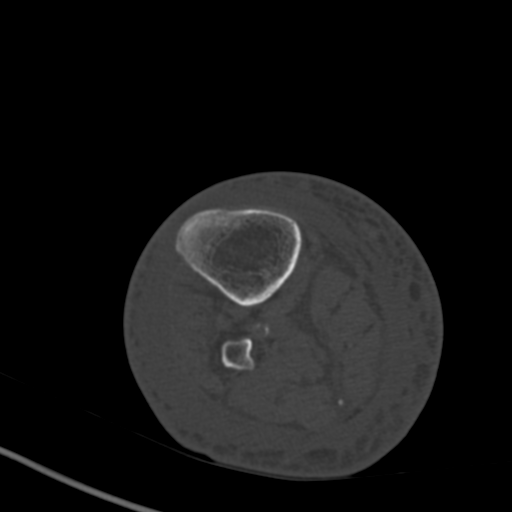
[im 312/338  bone]
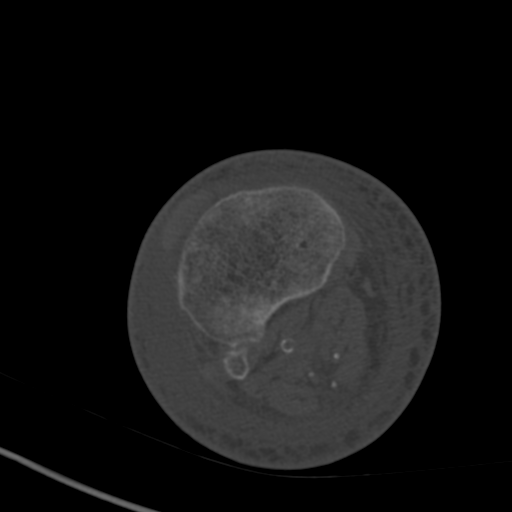

[12 of 14 positions shown; findings below may reference images not displayed]

FINDINGS: Diffuse and marked subcutaneous soft tissue swelling/edema/fluid
suggesting severe cellulitis. I do not see a discrete drainable
fluid collection to suggest a soft tissue abscess.

No findings suspicious for myofasciitis or pyomyositis.

No knee or ankle joint effusion is identified.

Severe vascular disease for age.

No CT findings suspicious for septic arthritis or osteomyelitis.
IMPRESSION: 1. Diffuse and marked subcutaneous soft tissue swelling/edema/fluid
suggesting severe cellulitis. No discrete drainable abscess.
2. No findings suspicious for myofasciitis or pyomyositis.
3. Severe vascular disease for age.
4. No findings suspicious for osteomyelitis or septic arthritis.

## 2022-06-18 IMAGING — DX DG CHEST 1V PORT
1 series · 1 of 1 positions shown · non-contrast
Comparison: 11/17/2019 and older studies.

CLINICAL DATA: Sepsis.  Hypotension.  Follow-up exam.

EXAM:
PORTABLE CHEST 1 VIEW

[chest ap]
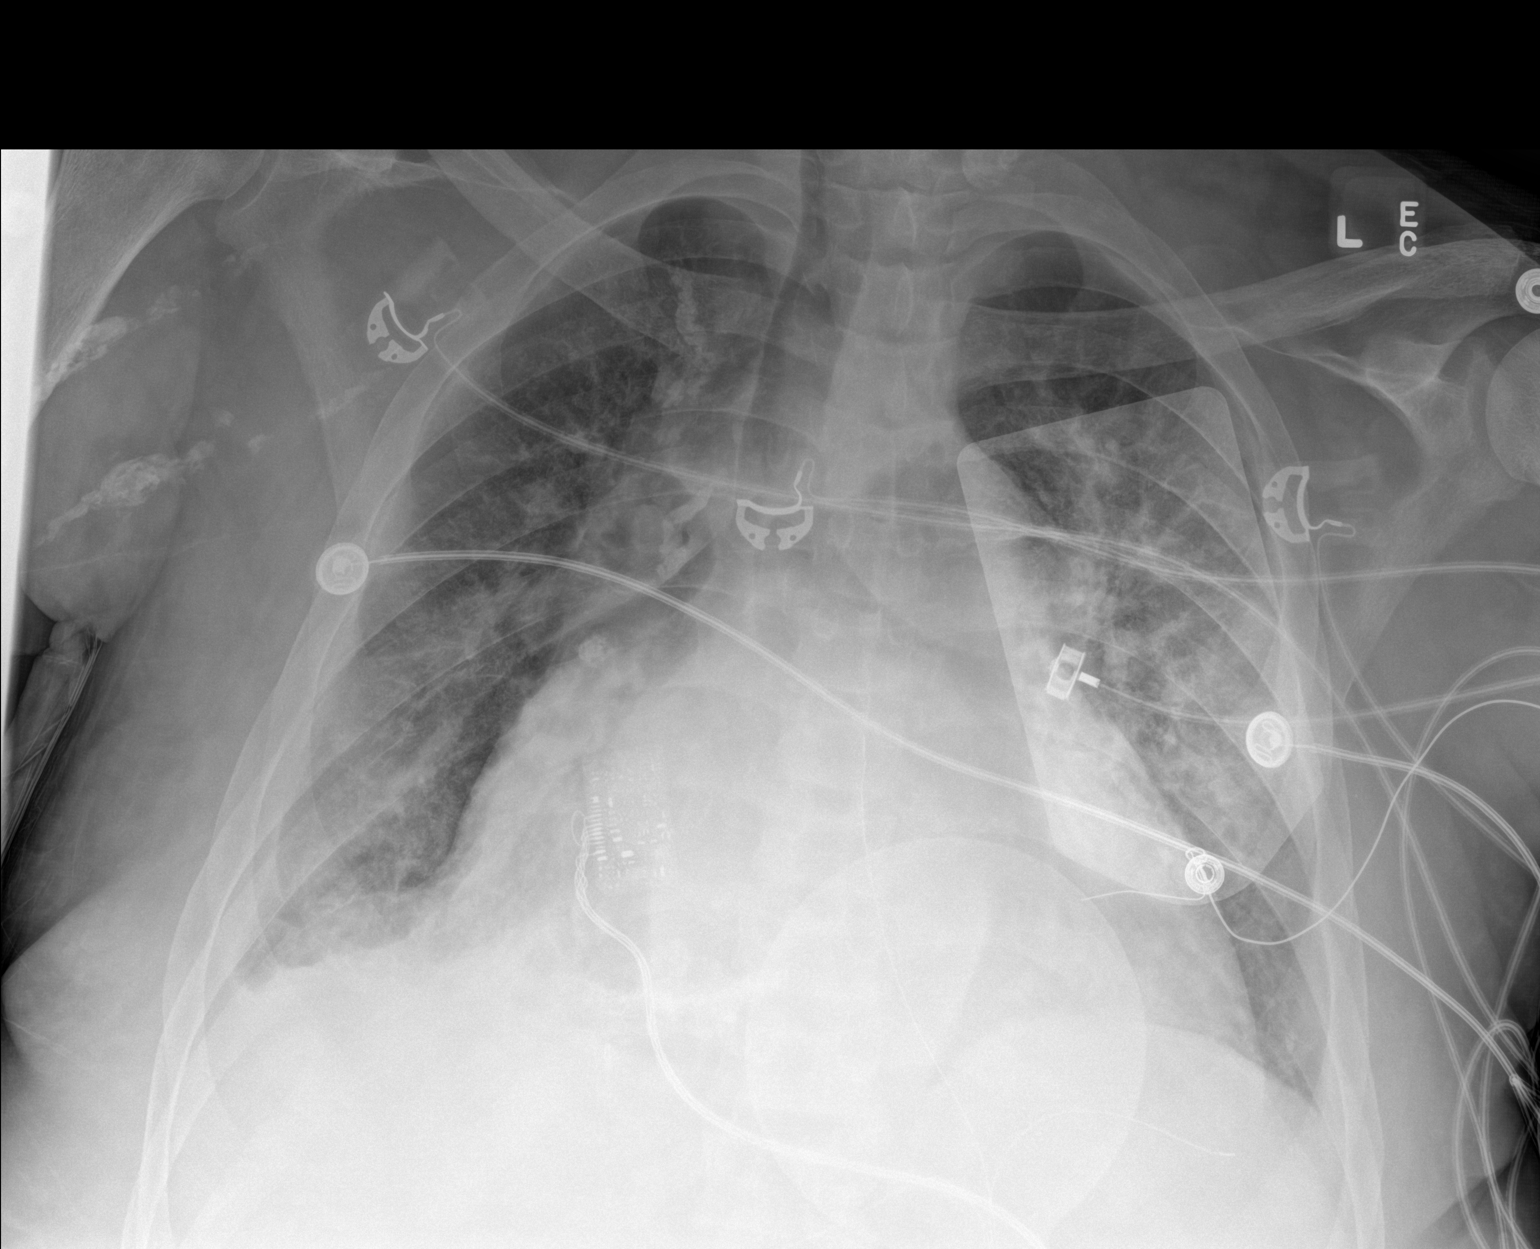

[1 of 1 positions shown; findings below may reference images not displayed]

FINDINGS: Stable cardiomegaly.  No mediastinal or hilar masses.

Thickened interstitial markings. There is additional hazy opacity at
the right lung base with the right hemidiaphragm partly obscured.
Suspect a small pleural effusion.

No pneumothorax.
IMPRESSION: 1. Stable appearance from the most recent prior exam.
2. Cardiomegaly with interstitial thickening as well as a small
right effusion. Possible mild degree congestive heart failure or
fluid overload. No convincing pneumonia.
